# Patient Record
Sex: Female | Born: 1945 | Race: White | Hispanic: No | Marital: Married | Smoking: Former smoker
Health system: Southern US, Community
[De-identification: ages and names within clinical notes are randomized; demographics above are authoritative.]

## PROBLEM LIST (undated history)

## (undated) DIAGNOSIS — I219 Acute myocardial infarction, unspecified: Secondary | ICD-10-CM

## (undated) DIAGNOSIS — J449 Chronic obstructive pulmonary disease, unspecified: Secondary | ICD-10-CM

## (undated) DIAGNOSIS — K8020 Calculus of gallbladder without cholecystitis without obstruction: Secondary | ICD-10-CM

## (undated) DIAGNOSIS — G4730 Sleep apnea, unspecified: Secondary | ICD-10-CM

## (undated) DIAGNOSIS — I10 Essential (primary) hypertension: Secondary | ICD-10-CM

## (undated) DIAGNOSIS — I779 Disorder of arteries and arterioles, unspecified: Secondary | ICD-10-CM

## (undated) DIAGNOSIS — I350 Nonrheumatic aortic (valve) stenosis: Secondary | ICD-10-CM

## (undated) DIAGNOSIS — C50919 Malignant neoplasm of unspecified site of unspecified female breast: Secondary | ICD-10-CM

## (undated) DIAGNOSIS — F419 Anxiety disorder, unspecified: Secondary | ICD-10-CM

## (undated) DIAGNOSIS — I5032 Chronic diastolic (congestive) heart failure: Secondary | ICD-10-CM

## (undated) DIAGNOSIS — Z8669 Personal history of other diseases of the nervous system and sense organs: Secondary | ICD-10-CM

## (undated) DIAGNOSIS — N390 Urinary tract infection, site not specified: Secondary | ICD-10-CM

## (undated) DIAGNOSIS — E785 Hyperlipidemia, unspecified: Secondary | ICD-10-CM

## (undated) DIAGNOSIS — Z9289 Personal history of other medical treatment: Secondary | ICD-10-CM

## (undated) DIAGNOSIS — I739 Peripheral vascular disease, unspecified: Secondary | ICD-10-CM

## (undated) DIAGNOSIS — E119 Type 2 diabetes mellitus without complications: Secondary | ICD-10-CM

## (undated) DIAGNOSIS — E669 Obesity, unspecified: Secondary | ICD-10-CM

## (undated) DIAGNOSIS — R0602 Shortness of breath: Secondary | ICD-10-CM

## (undated) DIAGNOSIS — I2510 Atherosclerotic heart disease of native coronary artery without angina pectoris: Secondary | ICD-10-CM

## (undated) HISTORY — PX: CARDIAC CATHETERIZATION: SHX172

## (undated) HISTORY — DX: Personal history of other diseases of the nervous system and sense organs: Z86.69

## (undated) HISTORY — DX: Acute myocardial infarction, unspecified: I21.9

## (undated) HISTORY — PX: APPENDECTOMY: SHX54

## (undated) HISTORY — DX: Atherosclerotic heart disease of native coronary artery without angina pectoris: I25.10

## (undated) HISTORY — DX: Obesity, unspecified: E66.9

## (undated) HISTORY — PX: NASAL SINUS SURGERY: SHX719

## (undated) HISTORY — DX: Nonrheumatic aortic (valve) stenosis: I35.0

## (undated) HISTORY — DX: Malignant neoplasm of unspecified site of unspecified female breast: C50.919

## (undated) HISTORY — DX: Disorder of arteries and arterioles, unspecified: I77.9

## (undated) HISTORY — DX: Hyperlipidemia, unspecified: E78.5

## (undated) HISTORY — DX: Essential (primary) hypertension: I10

## (undated) HISTORY — DX: Personal history of other medical treatment: Z92.89

## (undated) HISTORY — DX: Chronic diastolic (congestive) heart failure: I50.32

## (undated) HISTORY — DX: Type 2 diabetes mellitus without complications: E11.9

## (undated) HISTORY — PX: US ECHOCARDIOGRAPHY: HXRAD669

## (undated) HISTORY — DX: Peripheral vascular disease, unspecified: I73.9

## (undated) HISTORY — PX: TONSILLECTOMY: SUR1361

## (undated) HISTORY — PX: EYE SURGERY: SHX253

---

## 1975-08-06 HISTORY — PX: ABDOMINAL HYSTERECTOMY: SHX81

## 2001-02-11 ENCOUNTER — Other Ambulatory Visit: Admission: RE | Admit: 2001-02-11 | Discharge: 2001-02-11 | Payer: Self-pay | Admitting: *Deleted

## 2007-09-04 LAB — PULMONARY FUNCTION TEST

## 2008-05-18 ENCOUNTER — Ambulatory Visit (HOSPITAL_COMMUNITY): Admission: RE | Admit: 2008-05-18 | Discharge: 2008-05-18 | Payer: Self-pay | Admitting: Surgery

## 2008-06-13 ENCOUNTER — Ambulatory Visit (HOSPITAL_BASED_OUTPATIENT_CLINIC_OR_DEPARTMENT_OTHER): Admission: RE | Admit: 2008-06-13 | Discharge: 2008-06-13 | Payer: Self-pay | Admitting: Surgery

## 2008-06-15 ENCOUNTER — Encounter: Admission: RE | Admit: 2008-06-15 | Discharge: 2008-09-13 | Payer: Self-pay | Admitting: Surgery

## 2008-06-18 ENCOUNTER — Ambulatory Visit: Payer: Self-pay | Admitting: Internal Medicine

## 2008-06-22 ENCOUNTER — Ambulatory Visit (HOSPITAL_COMMUNITY): Admission: RE | Admit: 2008-06-22 | Discharge: 2008-06-22 | Payer: Self-pay | Admitting: Surgery

## 2008-08-05 HISTORY — PX: LAPAROSCOPIC GASTRIC BANDING: SHX1100

## 2008-08-23 ENCOUNTER — Ambulatory Visit (HOSPITAL_COMMUNITY): Admission: RE | Admit: 2008-08-23 | Discharge: 2008-08-24 | Payer: Self-pay | Admitting: Surgery

## 2008-10-13 ENCOUNTER — Encounter: Admission: RE | Admit: 2008-10-13 | Discharge: 2008-10-13 | Payer: Self-pay | Admitting: Surgery

## 2008-11-14 ENCOUNTER — Encounter: Admission: RE | Admit: 2008-11-14 | Discharge: 2008-11-14 | Payer: Self-pay | Admitting: Surgery

## 2009-02-13 ENCOUNTER — Encounter: Admission: RE | Admit: 2009-02-13 | Discharge: 2009-02-13 | Payer: Self-pay | Admitting: Surgery

## 2009-11-23 ENCOUNTER — Encounter: Admission: RE | Admit: 2009-11-23 | Discharge: 2010-02-21 | Payer: Self-pay | Admitting: Surgery

## 2010-08-26 ENCOUNTER — Encounter: Payer: Self-pay | Admitting: Surgery

## 2010-11-19 LAB — DIFFERENTIAL
Basophils Absolute: 0 10*3/uL (ref 0.0–0.1)
Basophils Absolute: 0 10*3/uL (ref 0.0–0.1)
Eosinophils Absolute: 0 10*3/uL (ref 0.0–0.7)
Eosinophils Relative: 0 % (ref 0–5)
Lymphocytes Relative: 12 % (ref 12–46)
Lymphocytes Relative: 32 % (ref 12–46)
Lymphs Abs: 1.6 10*3/uL (ref 0.7–4.0)
Monocytes Absolute: 0.4 10*3/uL (ref 0.1–1.0)
Neutro Abs: 2.9 10*3/uL (ref 1.7–7.7)

## 2010-11-19 LAB — HEMOGLOBIN AND HEMATOCRIT, BLOOD: HCT: 43.1 % (ref 36.0–46.0)

## 2010-11-19 LAB — CBC
HCT: 39.7 % (ref 36.0–46.0)
MCHC: 32.7 g/dL (ref 30.0–36.0)
MCV: 89.2 fL (ref 78.0–100.0)
Platelets: 137 10*3/uL — ABNORMAL LOW (ref 150–400)
Platelets: 143 10*3/uL — ABNORMAL LOW (ref 150–400)
RDW: 12.9 % (ref 11.5–15.5)
RDW: 13 % (ref 11.5–15.5)

## 2010-11-19 LAB — COMPREHENSIVE METABOLIC PANEL
Albumin: 3.6 g/dL (ref 3.5–5.2)
BUN: 22 mg/dL (ref 6–23)
Calcium: 9.4 mg/dL (ref 8.4–10.5)
Chloride: 106 mEq/L (ref 96–112)
Creatinine, Ser: 0.86 mg/dL (ref 0.4–1.2)
Total Bilirubin: 0.5 mg/dL (ref 0.3–1.2)
Total Protein: 6.9 g/dL (ref 6.0–8.3)

## 2010-11-19 LAB — GLUCOSE, CAPILLARY: Glucose-Capillary: 173 mg/dL — ABNORMAL HIGH (ref 70–99)

## 2010-12-18 NOTE — Op Note (Signed)
[name], [name]                 ACCOUNT NO.:  [id number]   MEDICAL RECORD NO.:  [id number]          PATIENT TYPE:  AMB   LOCATION:  DAY                          FACILITY:  [facility]   PHYSICIAN:  Thornton Park. Daphine Deutscher, MD  DATE OF BIRTH:  25-Nov-1945   DATE OF PROCEDURE:  08/23/2008  DATE OF DISCHARGE:                               OPERATIVE REPORT   PREOPERATIVE DIAGNOSIS:  Morbid obesity, BMI of 45.   POSTOPERATIVE DIAGNOSIS:  Morbid obesity, BMI of 45.   PROCEDURE:  Laparoscopic adjustable gastric banding with Allergan APL  system.   SURGEON:  Luretha Murphy, M.D.   ASSISTANT:  Lorne Skeens. Hoxworth, M.D.   ANESTHESIA:  General endotracheal.   DESCRIPTION OF PROCEDURE:  This 65 year old lady was taken to room 1 on  August 23, 2008 and given general anesthesia.  The abdomen was prepped  with a Techni-Care equivalent and draped sterilely.  Access to the  abdomen was gained through the left upper quadrant using an OptiView 0  degrees 12 mm trocar entering without difficulty.  Once inflated the  abdomen was explored.  Usual trocar placements were used including a 15  in the upper port on the right.   I then did dissection at the EG junction on the left along the crura for  the band exit site and then went down along the right crus and found the  site for the band passer to be passed.  This was done easily and the  band passer went around and it came out through afore-described  dissected area.  This went well and the band was introduced through the  15 mm port and threaded through the tip.  The band then came around on  the first pass and was engaged in the buckle.  The tubing was passed by  Delphia Grates, the nurse anesthetist and blown up to 15 mL and pulled  back and no hiatal hernia was noted.  The band was then snapped over the  tubing and again the black collar was visible.  It was then plicated  anteriorly with three sutures using three SH needles held in place using  the  Surgidac with tie knots.  Once completed, everything looked good.  The Nathanson's retractor was withdrawn and the tubing was  brought to the lower port on the right.  It was connected to a port to  which I had previously affixed mesh along the back.  This was then  implanted in the subcutaneous pocket at the lateral margin of the  incision.  The patient tolerated this procedure well.  Wounds were  closed with 4-0 Vicryl, Benzoin Steri-Strips.      Thornton Park Daphine Deutscher, MD  Electronically Signed     MBM/MEDQ  D:  08/23/2008  T:  08/23/2008  Job:  [id number]   cc:   Duwayne Heck L. Mahaffey, M.D.  Fax: [phone]

## 2010-12-18 NOTE — Procedures (Signed)
NAME:  Jamie [name], Jamie [name]                 ACCOUNT NO.:  [id number]   MEDICAL RECORD NO.:  [id number]          PATIENT TYPE:  OUT   LOCATION:  SLEEP CENTER                 FACILITY:  [facility]   PHYSICIAN:  Clinton D. Maple Hudson, MD, FCCP, FACPDATE OF BIRTH:  1945/04/04   DATE OF STUDY:  06/13/2008                            NOCTURNAL POLYSOMNOGRAM   REFERRING PHYSICIAN:  Thornton Park. Daphine Deutscher, MD   REFERRING PHYSICIAN:  Thornton Park. Daphine Deutscher, MD   INDICATION FOR STUDY:  Hypersomnia with sleep apnea.  Epworth sleepiness  score 5/24.  BMI 46.9.  Weight 282 pounds.  Height 65 inches.  Neck 16  inches.   HOME MEDICATIONS:  Charted and reviewed.   SLEEP ARCHITECTURE:  Total sleep time is 343 minutes with sleep  efficiency 80%.  Stage I was 6.9%, stage II was 73.2%, stage III absent,  REM 20% of the total sleep time.  Sleep latency 34 minutes, REM latency  168 minutes, the wake-after-sleep onset 49 minutes, arousal index 22.6.   BEDTIME MEDICATIONS:  Included lisinopril, loratadine, metoprolol, baby  aspirin, and simvastatin.   RESPIRATORY DATA:  Apnea-hypopnea index (AHI) 11.5 per hour.  A total of  66 events was scored, all hypopneas.  Events were more common while  supine.  REM AHI 37.7 per hour.   OXYGEN DATA:  Moderate snoring with oxygen desaturation to a nadir of  67%.  Mean oxygen saturation through the study was 93.2% on room air.   CARDIAC DATA:  Sinus rhythm with occasional PAC.   MOVEMENT/PARASOMNIA:  Occasional period limb movement noted with a total  of 72 limb jerks of which 7 were associated with arousal or awakening  for periodic limb movement with arousal index of 1.2 per hour.  One  bathroom trip.   IMPRESSION/RECOMMENDATION:  1. Mild obstructive sleep apnea/hypopnea syndrome, apnea-hypopnea      index 11.5 per hour.  All events were hypopneas, seen in all sleep      positions, but more common while supine as expected.  Moderate      snoring with oxygen desaturation to a nadir of  67%.  A total of      18.4 minutes was spent with oxygen saturation less than 88% on room      air.  2. The patient did not qualify for CPAP titration by emergency      protocol per the scheduling orders.  CPAP would be a therapeutic      option if clinically appropriate, especially if surgery with      general anesthesia is anticipated.      She could be treated by auto-titration during hospitalization or      with an empiric CPAP setting of 10CWP until there is opportunity to      have a formal CPAP titration.      Clinton D. Maple Hudson, MD, [facility], FACP  Diplomate, Biomedical engineer of Sleep Medicine  Electronically Signed     CDY/MEDQ  D:  06/18/2008 13:18:05  T:  06/19/2008 03:07:17  Job:  [id number]

## 2011-08-06 DIAGNOSIS — C50919 Malignant neoplasm of unspecified site of unspecified female breast: Secondary | ICD-10-CM

## 2011-08-06 HISTORY — DX: Malignant neoplasm of unspecified site of unspecified female breast: C50.919

## 2011-08-26 ENCOUNTER — Other Ambulatory Visit: Payer: Self-pay | Admitting: Family Medicine

## 2011-08-26 ENCOUNTER — Ambulatory Visit
Admission: RE | Admit: 2011-08-26 | Discharge: 2011-08-26 | Disposition: A | Discharge status: Home / Self Care | Payer: Medicare Other | Source: Ambulatory Visit | Attending: Family Medicine | Admitting: Family Medicine

## 2011-08-26 ENCOUNTER — Ambulatory Visit (INDEPENDENT_AMBULATORY_CARE_PROVIDER_SITE_OTHER): Payer: Medicare Other | Admitting: General Surgery

## 2011-08-26 ENCOUNTER — Encounter (INDEPENDENT_AMBULATORY_CARE_PROVIDER_SITE_OTHER): Payer: Self-pay | Admitting: General Surgery

## 2011-08-26 VITALS — BP 126/74 | HR 70 | Temp 98.3°F | Resp 18 | Ht 66.0 in | Wt 274.8 lb

## 2011-08-26 DIAGNOSIS — N63 Unspecified lump in unspecified breast: Secondary | ICD-10-CM

## 2011-08-26 DIAGNOSIS — N6320 Unspecified lump in the left breast, unspecified quadrant: Secondary | ICD-10-CM | POA: Insufficient documentation

## 2011-08-26 NOTE — Progress Notes (Signed)
Subjective:     Patient ID: Jamie [name], female   DOB: [birth year], 66 y.o.   MRN: [id number]  HPI We are asked to see the patient in consultation by Dr. Joetta Manners to evaluate her for a left breast mass. The patient is a 66 year old white female who first noticed a lump in her left breast about 3 weeks ago. She felt this in the shower while she was washing. She had also been sick with an upper respiratory infection about the same time. Since noticing it she does feel some discomfort there. She denies any discharge from the nipple on either side. She does have an aunt that has breast cancer. Otherwise she has no history of breast problems. She brought this mass to her doctor's attention. She underwent mammograms and ultrasounds that showed the area to be about 3 cm in diameter. It does appear as though the mass was present on her mammogram from July of 2012  Review of Systems  Constitutional: Negative.   HENT: Negative.   Eyes: Negative.   Respiratory: Negative.   Cardiovascular: Negative.   Gastrointestinal: Negative.   Genitourinary: Negative.   Musculoskeletal: Negative.   Skin: Negative.   Neurological: Negative.   Hematological: Negative.   Psychiatric/Behavioral: Negative.        Objective:   Physical Exam  Constitutional: She is oriented to person, place, and time. She appears well-developed and well-nourished.  HENT:  Head: Normocephalic and atraumatic.  Eyes: Conjunctivae and EOM are normal. Pupils are equal, round, and reactive to light.  Neck: Normal range of motion. Neck supple.  Cardiovascular: Normal rate, regular rhythm and normal heart sounds.   Pulmonary/Chest: Effort normal and breath sounds normal.       The patient has a approximately 3 cm mass in the 12:00 position of the left breast. It is mobile. It does not appear to be tethered to the skin of the chest wall. There is no palpable mass in the right breast other than just some small nodularity diffusely. No  palpable axillary supraclavicular or cervical lymphadenopathy  Abdominal: Soft. Bowel sounds are normal. She exhibits no mass. There is no tenderness.  Musculoskeletal: Normal range of motion.  Neurological: She is alert and oriented to person, place, and time.  Skin: Skin is warm and dry.  Psychiatric: She has a normal mood and affect. Her behavior is normal.       Assessment:     Large palpable mass in the 12:00 position of the left breast    Plan:     The mass looked suspicious for breast cancer. I have looked at the x-rays with the radiologist at the breast center and they concur. We will have her go to the breast center today for further evaluation. She will definitely need a core needle biopsy of this lesion before we can determine what needs to be done. We will plan to see her back in next few days to go over the results of her biopsy.

## 2011-08-27 ENCOUNTER — Other Ambulatory Visit: Payer: Self-pay

## 2011-08-27 ENCOUNTER — Other Ambulatory Visit (INDEPENDENT_AMBULATORY_CARE_PROVIDER_SITE_OTHER): Payer: Self-pay | Admitting: General Surgery

## 2011-08-27 DIAGNOSIS — N6489 Other specified disorders of breast: Secondary | ICD-10-CM

## 2011-08-27 DIAGNOSIS — C50912 Malignant neoplasm of unspecified site of left female breast: Secondary | ICD-10-CM

## 2011-08-28 ENCOUNTER — Ambulatory Visit
Admission: RE | Admit: 2011-08-28 | Discharge: 2011-08-28 | Disposition: A | Discharge status: Home / Self Care | Payer: Medicare Other | Source: Ambulatory Visit | Attending: General Surgery | Admitting: General Surgery

## 2011-08-28 ENCOUNTER — Other Ambulatory Visit (INDEPENDENT_AMBULATORY_CARE_PROVIDER_SITE_OTHER): Payer: Self-pay | Admitting: General Surgery

## 2011-08-28 ENCOUNTER — Encounter (INDEPENDENT_AMBULATORY_CARE_PROVIDER_SITE_OTHER): Payer: Self-pay | Admitting: General Surgery

## 2011-08-28 ENCOUNTER — Telehealth: Payer: Self-pay | Admitting: *Deleted

## 2011-08-28 ENCOUNTER — Ambulatory Visit (INDEPENDENT_AMBULATORY_CARE_PROVIDER_SITE_OTHER): Payer: Medicare Other | Admitting: General Surgery

## 2011-08-28 VITALS — BP 146/80 | HR 72 | Temp 98.2°F | Resp 14 | Ht 65.0 in | Wt 274.0 lb

## 2011-08-28 DIAGNOSIS — C50919 Malignant neoplasm of unspecified site of unspecified female breast: Secondary | ICD-10-CM

## 2011-08-28 DIAGNOSIS — N6489 Other specified disorders of breast: Secondary | ICD-10-CM

## 2011-08-28 NOTE — Progress Notes (Signed)
Subjective:     Patient ID: Jamie [name], female   DOB: 1945/08/27, 66 y.o.   MRN: [id number]  HPI The patient is a 66 year old white female who we saw a couple days ago with a new mass in the left breast. This was biopsied I believe on Monday and it came back as invasive ductal cancer. Tumor markers are still pending. The patient tolerated the biopsy well.  Review of Systems     Objective:   Physical Exam Her exam is unchanged. 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 I've discussed this with her in detail including the risks and benefits of surgery as well as some of the clinical aspects and she understands. We would like her to meet with Dr. Darnelle Catalan to talk about the possibility of chemotherapy either before or after surgery prior to planning her mastectomies. We will call and make the appointment with Dr. Darnelle Catalan.

## 2011-08-28 NOTE — Patient Instructions (Signed)
Will call to set up surgery after appt with Dr. Darnelle Catalan

## 2011-08-28 NOTE — Telephone Encounter (Signed)
Confirmed 09/03/11 appt w/ pt's husband Forrest.  Mailed before appt letter to pt.

## 2011-08-29 ENCOUNTER — Other Ambulatory Visit (INDEPENDENT_AMBULATORY_CARE_PROVIDER_SITE_OTHER): Payer: Self-pay | Admitting: General Surgery

## 2011-08-29 DIAGNOSIS — C50912 Malignant neoplasm of unspecified site of left female breast: Secondary | ICD-10-CM

## 2011-08-30 ENCOUNTER — Ambulatory Visit
Admission: RE | Admit: 2011-08-30 | Discharge: 2011-08-30 | Disposition: A | Discharge status: Home / Self Care | Payer: Medicare Other | Source: Ambulatory Visit | Attending: General Surgery | Admitting: General Surgery

## 2011-08-30 ENCOUNTER — Other Ambulatory Visit (INDEPENDENT_AMBULATORY_CARE_PROVIDER_SITE_OTHER): Payer: Self-pay | Admitting: General Surgery

## 2011-08-30 ENCOUNTER — Other Ambulatory Visit: Payer: Medicare Other

## 2011-08-30 ENCOUNTER — Other Ambulatory Visit: Payer: Self-pay | Admitting: Diagnostic Radiology

## 2011-08-30 DIAGNOSIS — C50912 Malignant neoplasm of unspecified site of left female breast: Secondary | ICD-10-CM

## 2011-09-02 ENCOUNTER — Other Ambulatory Visit: Payer: Self-pay | Admitting: *Deleted

## 2011-09-02 DIAGNOSIS — C50919 Malignant neoplasm of unspecified site of unspecified female breast: Secondary | ICD-10-CM

## 2011-09-03 ENCOUNTER — Ambulatory Visit (HOSPITAL_BASED_OUTPATIENT_CLINIC_OR_DEPARTMENT_OTHER): Payer: Medicare Other | Admitting: Oncology

## 2011-09-03 ENCOUNTER — Ambulatory Visit: Payer: Medicare Other

## 2011-09-03 ENCOUNTER — Encounter (INDEPENDENT_AMBULATORY_CARE_PROVIDER_SITE_OTHER): Payer: Medicare Other | Admitting: General Surgery

## 2011-09-03 ENCOUNTER — Other Ambulatory Visit (HOSPITAL_BASED_OUTPATIENT_CLINIC_OR_DEPARTMENT_OTHER): Payer: Medicare Other | Admitting: Lab

## 2011-09-03 VITALS — BP 165/79 | HR 87 | Temp 98.1°F | Ht 64.0 in | Wt 277.2 lb

## 2011-09-03 DIAGNOSIS — C50919 Malignant neoplasm of unspecified site of unspecified female breast: Secondary | ICD-10-CM

## 2011-09-03 LAB — CBC WITH DIFFERENTIAL/PLATELET
BASO%: 0.5 % (ref 0.0–2.0)
Eosinophils Absolute: 0.2 10*3/uL (ref 0.0–0.5)
LYMPH%: 32.3 % (ref 14.0–49.7)
MCHC: 33.3 g/dL (ref 31.5–36.0)
MCV: 86.4 fL (ref 79.5–101.0)
MONO%: 9.9 % (ref 0.0–14.0)
NEUT#: 3 10*3/uL (ref 1.5–6.5)
Platelets: 154 10*3/uL (ref 145–400)
RBC: 4.67 10*6/uL (ref 3.70–5.45)
RDW: 13 % (ref 11.2–14.5)
WBC: 5.6 10*3/uL (ref 3.9–10.3)

## 2011-09-03 LAB — COMPREHENSIVE METABOLIC PANEL
ALT: 39 U/L — ABNORMAL HIGH (ref 0–35)
AST: 32 U/L (ref 0–37)
Albumin: 3.4 g/dL — ABNORMAL LOW (ref 3.5–5.2)
Alkaline Phosphatase: 69 U/L (ref 39–117)
Glucose, Bld: 92 mg/dL (ref 70–99)
Potassium: 3.8 mEq/L (ref 3.5–5.3)
Sodium: 141 mEq/L (ref 135–145)
Total Bilirubin: 0.2 mg/dL — ABNORMAL LOW (ref 0.3–1.2)
Total Protein: 7.5 g/dL (ref 6.0–8.3)

## 2011-09-03 NOTE — Progress Notes (Signed)
Jamie [name]  DOB: 09/12/1945  MR#: [id number]  CSN#: [id number]    History of present illness:   Jamie [name] is a 66 year old [name] woman who noted a mass in her left breast some weeks ago. She saw her primary physician, Dr. Carolyne Fiscal shortly thereafter and Dr. Carolyne Fiscal set her up for a diagnostic mammography, showing a mass of about 3 cm in diameter(I do not have a copy of those studies with a prior mammography from July of 2012). The patient was referred to Dr. Carolynne Edouard and he set her up for biopsy of the mass degenerative 21st 2013 at the breast Center. The pathology showed [phone]) and invasive ductal carcinoma, grade 2, estrogen receptor 100% positive, progesterone receptor 96% positive, with an MIB-1 of 30%, and HER-2 equivocal with the ratio by CISH of 1.81.  Diagnostic right mammography was then performed 08/28/2011. Multiple masses were noted, and there were multiple soft tissue nodules by palpation. Ultrasound specifically showed 2 hypoechoic round masses measuring 1.3 and 0.9 cm respectively. 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 With this information the patient presents today for discussion of her overall treatment plan.   Past medical history:      Past Medical History  Diagnosis Date  . Left breast mass    COPD, obesity, 80-pack-year tobacco abuse, discontinued 3 years ago; history of bladder implant; history of C-section, appendectomy, and tonsillectomy and adenoidectomy. She had a hysterectomy with no stopping the oophorectomy. She status post lap band surgery under Dr. Daphine Deutscher  Family history:   The patient's father died at the age of 69. He had a history of asbestos exposure. The patient's mother died at the age of 46 from heart disease. The patient had one brother, no sisters. There is no breast or ovarian cancer in the immediate family but 2 of the patient's 5 maternal aunts had breast cancer (she does not know  the age of diagnosis), and one of her father's sisters had ovarian cancer.  Gynecologic history:   She had menarche age 45, no firm date for her menopause as she had hysterectomy remotely. She is GX P2, with a pair of twins and a daughter as her biological children as noted below. She never took hormone replacement.  Social history:   She owns a tuxedo shop. Her husband of 34 years, [name], is retired of but used to own a Designer, television/film set. He is currently working in IT with one of his own sons. At home in addition to the patient and her husband is one of the patient's daughters and her 2 children, as well as one of Forrest sons. Altogether they have 6 children and 15 grandchildren. The patient attends [employer].    ADVANCED DIRECTIVES:  Health maintenance:       History  Substance Use Topics  . Smoking status: Former Smoker    Quit date: 08/25/2006  . Smokeless tobacco: Never Used  . Alcohol Use: Yes     rarely      Colonoscopy: 2012  PAP: n/a  Bone density: "normal"  Cholesterol:  Review of systems:  She has currently a little bit of a sore throat and some mouth sores. Her feet swell at times. She feels short of breath when she does have a significant walking or when she walks up stairs. She bruises easily. Overall however a detailed review of systems today was unremarkable  Allergies:     Allergies  Allergen  Reactions  . Sulfur Other (See Comments)    Patient unaware of reaction, happened in childhood.    Medications:      Current Outpatient Prescriptions  Medication Sig Dispense Refill  . calcium citrate-vitamin D (CITRACAL+D) 315-200 MG-UNIT per tablet Take 1 tablet by mouth 2 (two) times daily.      . fish oil-omega-3 fatty acids 1000 MG capsule Take 2 g by mouth daily.      Marland Kitchen loratadine (CLARITIN) 10 MG tablet Take 10 mg by mouth daily.      . simvastatin (ZOCOR) 20 MG tablet Daily.      Marland Kitchen tiotropium (SPIRIVA) 18 MCG inhalation capsule Place 18 mcg into inhaler  and inhale daily.      Marland Kitchen aspirin 81 MG tablet Take 160 mg by mouth daily.      Marland Kitchen nystatin (MYCOSTATIN) 100000 UNIT/ML suspension as needed.        Physical exam:  Middle-aged white woman who appears anxious    Filed Vitals:   09/03/11 1623  BP: 165/79  Pulse: 87  Temp: 98.1 F (36.7 C)     Body mass index is 47.58 kg/(m^2).  ECOG PS: 0  Sclerae unicteric Oropharynx clear No peripheral adenopathy Lungs no rales or rhonchi Heart regular rate and rhythm Abd benign MSK no focal spinal tenderness, no peripheral edema Neuro: nonfocal Breasts: The right breast is status post recent biopsy. As a large ecchymosis. I do not palpate any definite masses. In the left breast and there is a lateral superior mass measuring at least 3 cm, but there is also an associated ecchymosis and some of this may be swelling. There is no nipple retraction or evidence of skin involvement. The left breast mass is not fixed.   Lab results:      CBC  Lab 09/03/11 1549  WBC 5.6  HGB 13.4  HCT 40.4  PLT 154  MCV 86.4  MCH 28.8  MCHC 33.3  RDW 13.0  LYMPHSABS 1.8  MONOABS 0.6  EOSABS 0.2  BASOSABS 0.0  BANDABS --    Chemistries   Lab 09/03/11 1549  NA 141  K 3.8  CL 105  CO2 25  GLUCOSE 92  BUN 15  CREATININE 0.82  CALCIUM 9.8  MG --      Studies:    As a ready discussed.  Assessment: 66 year old [name] woman status post left breast biopsy 08/26/2011 for a clinical T2 N0, stage II A. invasive ductal carcinoma, grade 2, strongly estrogen and progesterone receptor positive, HER-2 a quick local, with an MIB-1 of 30%. In addition, one of 2 masses in the right breast was biopsied showing a sclerosing papilloma.       Plan: We spent the better part of her hour-long visit today discussing the biology of her tumor, treatment of breast cancer in general, and her on the situation. The decision whether or not to recommend chemotherapy is not simple. The adjuvant! Program would put her a risk of  recurrence in the 34% range with local treatment only if indeed she has a T2 N0 tumor. Anti-estrogens would reduce this risk by 15% or so, and chemotherapy might reduce in another 4-5%. All this would change if with additional information we found that she was lymph node positive, or that she ended up being a truly HER-2 positive.   For that reason I am not recommending port be placed at the time of her definitive surgery. Also we will not set another type of until we have  the sentinel lymph node information. If she is lymph node-negative however I think an Oncotype would be a good idea in this case.  She is aware that mastectomy does not improve survival as compared to lumpectomy and radiation, but is very drawn to the idea of bilateral mastectomies because she "wants to be done with it" in terms of no future mammograms, no additional biopsies now or in the future, and so on. I believe she is making this decision on a rational basis and I support it in her case, even though she understands our standard recommendation and that of the NCCN is for lumpectomy and radiation.  She is interested in genetics counseling, and though her risk of being BRCA1 or 2 positive is low, I will place that referral. I am also setting her up for a CT scan of the chest. I expect that to be negative. She will see me 2-4 weeks after her definitive surgery. At that time we should be able to make a definitive decision regarding her systemic treatment plan   Tinzlee Craker C 09/03/2011

## 2011-09-04 ENCOUNTER — Telehealth: Payer: Self-pay | Admitting: Oncology

## 2011-09-04 ENCOUNTER — Encounter: Payer: Self-pay | Admitting: *Deleted

## 2011-09-04 NOTE — Telephone Encounter (Signed)
S/w the pt and she is aware of her feb 2013 ct scan appt along with the md appt in march

## 2011-09-04 NOTE — Progress Notes (Signed)
Mailed after appt letter to pt. 

## 2011-09-05 ENCOUNTER — Telehealth (INDEPENDENT_AMBULATORY_CARE_PROVIDER_SITE_OTHER): Payer: Self-pay | Admitting: General Surgery

## 2011-09-10 ENCOUNTER — Other Ambulatory Visit (INDEPENDENT_AMBULATORY_CARE_PROVIDER_SITE_OTHER): Payer: Self-pay | Admitting: General Surgery

## 2011-09-10 ENCOUNTER — Ambulatory Visit (HOSPITAL_COMMUNITY)
Admission: RE | Admit: 2011-09-10 | Discharge: 2011-09-10 | Disposition: A | Discharge status: Home / Self Care | Payer: Medicare Other | Source: Ambulatory Visit | Attending: Oncology | Admitting: Oncology

## 2011-09-10 ENCOUNTER — Encounter (HOSPITAL_COMMUNITY): Payer: Self-pay

## 2011-09-10 DIAGNOSIS — R05 Cough: Secondary | ICD-10-CM | POA: Insufficient documentation

## 2011-09-10 DIAGNOSIS — Z9884 Bariatric surgery status: Secondary | ICD-10-CM | POA: Insufficient documentation

## 2011-09-10 DIAGNOSIS — M47814 Spondylosis without myelopathy or radiculopathy, thoracic region: Secondary | ICD-10-CM | POA: Insufficient documentation

## 2011-09-10 DIAGNOSIS — J9819 Other pulmonary collapse: Secondary | ICD-10-CM | POA: Insufficient documentation

## 2011-09-10 DIAGNOSIS — R0602 Shortness of breath: Secondary | ICD-10-CM | POA: Insufficient documentation

## 2011-09-10 DIAGNOSIS — C50919 Malignant neoplasm of unspecified site of unspecified female breast: Secondary | ICD-10-CM

## 2011-09-10 DIAGNOSIS — K7689 Other specified diseases of liver: Secondary | ICD-10-CM | POA: Insufficient documentation

## 2011-09-10 DIAGNOSIS — R059 Cough, unspecified: Secondary | ICD-10-CM | POA: Insufficient documentation

## 2011-09-10 HISTORY — DX: Chronic obstructive pulmonary disease, unspecified: J44.9

## 2011-09-10 MED ORDER — IOHEXOL 300 MG/ML  SOLN
100.0000 mL | Freq: Once | INTRAMUSCULAR | Status: AC | PRN
  Administered 2011-09-10: 100 mL via INTRAVENOUS

## 2011-09-11 ENCOUNTER — Telehealth (INDEPENDENT_AMBULATORY_CARE_PROVIDER_SITE_OTHER): Payer: Self-pay | Admitting: General Surgery

## 2011-09-11 ENCOUNTER — Telehealth: Payer: Self-pay | Admitting: *Deleted

## 2011-09-11 NOTE — Telephone Encounter (Signed)
Confirmed 09/23/11 genetics appt w pt.  Gave request to Med Rec to fax labs to pts primary Heather Spry at [place].

## 2011-09-11 NOTE — Telephone Encounter (Signed)
I CALLED Jamie [name] YESTERDAY RE SCHEDULING OF HER BILATERAL MASTECTOMIES/ SHE INDICATED SHE WOULD LIKE TO SPEAK WITH A PLASTIC SURGEON RE RECONSTRUCTION. I CALLED DR. SANGER'S OFFICE TODAY RE AN APPT/ DARA SCHEDULED Jamie [name] TO SEE DR. C. SANGER ON 09-24-11 AT 9:45AM/ PT NOTIFIED OF APPOINTMENT/GY

## 2011-09-23 ENCOUNTER — Ambulatory Visit: Payer: Medicare Other | Admitting: Lab

## 2011-09-23 ENCOUNTER — Ambulatory Visit: Payer: Medicare Other

## 2011-09-23 NOTE — Progress Notes (Signed)
Blood sent to [facility] for BRCA1/BRCA2.

## 2011-10-01 ENCOUNTER — Telehealth: Payer: Self-pay | Admitting: Genetic Counselor

## 2011-10-03 ENCOUNTER — Encounter (INDEPENDENT_AMBULATORY_CARE_PROVIDER_SITE_OTHER): Payer: Self-pay

## 2011-10-04 ENCOUNTER — Encounter (HOSPITAL_COMMUNITY): Payer: Self-pay | Admitting: Pharmacy Technician

## 2011-10-10 ENCOUNTER — Telehealth: Payer: Self-pay | Admitting: *Deleted

## 2011-10-10 NOTE — Telephone Encounter (Signed)
Patient called stating that her surgery is on 3/15 and her f/u appt is w/ Dr. Thea Silversmith on 3/14 and needs to change it.  I read over Dr. Darrall Dears note and it stated that he wanted to see her 3-4 weeks after surgery, so I rescheduled her appt.  Confirmed 3/27 appt w/ pt.

## 2011-10-14 ENCOUNTER — Other Ambulatory Visit (HOSPITAL_COMMUNITY): Payer: Medicare Other

## 2011-10-14 ENCOUNTER — Ambulatory Visit (HOSPITAL_COMMUNITY)
Admission: RE | Admit: 2011-10-14 | Discharge: 2011-10-14 | Disposition: A | Discharge status: Home / Self Care | Payer: Medicare Other | Source: Ambulatory Visit | Attending: Anesthesiology | Admitting: Anesthesiology

## 2011-10-14 ENCOUNTER — Encounter (HOSPITAL_COMMUNITY): Admission: RE | Payer: Self-pay | Source: Ambulatory Visit

## 2011-10-14 ENCOUNTER — Ambulatory Visit (HOSPITAL_COMMUNITY): Admission: RE | Admit: 2011-10-14 | Payer: Medicare Other | Source: Ambulatory Visit | Admitting: General Surgery

## 2011-10-14 ENCOUNTER — Encounter (HOSPITAL_COMMUNITY)
Admission: RE | Admit: 2011-10-14 | Discharge: 2011-10-14 | Disposition: A | Discharge status: Home / Self Care | Payer: Medicare Other | Source: Ambulatory Visit | Attending: General Surgery | Admitting: General Surgery

## 2011-10-14 ENCOUNTER — Other Ambulatory Visit: Payer: Self-pay

## 2011-10-14 ENCOUNTER — Encounter (HOSPITAL_COMMUNITY): Payer: Self-pay

## 2011-10-14 DIAGNOSIS — Z01810 Encounter for preprocedural cardiovascular examination: Secondary | ICD-10-CM | POA: Insufficient documentation

## 2011-10-14 DIAGNOSIS — Z01818 Encounter for other preprocedural examination: Secondary | ICD-10-CM | POA: Insufficient documentation

## 2011-10-14 DIAGNOSIS — Z01812 Encounter for preprocedural laboratory examination: Secondary | ICD-10-CM | POA: Insufficient documentation

## 2011-10-14 DIAGNOSIS — M47814 Spondylosis without myelopathy or radiculopathy, thoracic region: Secondary | ICD-10-CM | POA: Insufficient documentation

## 2011-10-14 HISTORY — DX: Shortness of breath: R06.02

## 2011-10-14 HISTORY — DX: Urinary tract infection, site not specified: N39.0

## 2011-10-14 HISTORY — DX: Sleep apnea, unspecified: G47.30

## 2011-10-14 LAB — BASIC METABOLIC PANEL
Calcium: 9.7 mg/dL (ref 8.4–10.5)
GFR calc Af Amer: 86 mL/min — ABNORMAL LOW (ref >90)
GFR calc non Af Amer: 75 mL/min — ABNORMAL LOW (ref >90)
Glucose, Bld: 91 mg/dL (ref 70–99)
Sodium: 141 mEq/L (ref 135–145)

## 2011-10-14 LAB — SURGICAL PCR SCREEN: Staphylococcus aureus: NEGATIVE

## 2011-10-14 LAB — CBC
HCT: 43.6 % (ref 36.0–46.0)
MCHC: 32.3 g/dL (ref 30.0–36.0)
Platelets: 185 10*3/uL (ref 150–400)
RDW: 13.5 % (ref 11.5–15.5)

## 2011-10-14 SURGERY — MASTECTOMY WITH SENTINEL LYMPH NODE BIOPSY
Anesthesia: General | Laterality: Bilateral

## 2011-10-14 MED ORDER — CHLORHEXIDINE GLUCONATE 4 % EX LIQD: 1.0000 "application " | Freq: Once | CUTANEOUS | Status: DC

## 2011-10-14 NOTE — Pre-Procedure Instructions (Addendum)
20 NENA HAMPE  10/14/2011   Your procedure is scheduled on:  3.15.13  Report to [facility] at 830 AM.  Call this number if you have problems the morning of surgery: [phone]   Remember:   Do not eat food:After Midnight.  May have clear liquids: up to 4 Hours before arrival.430 am  Clear liquids include soda, tea, black coffee, apple or grape juice, broth.  Take these medicines the morning of surgery with A SIP OF WATER:use inhalers if needed, bring with you, loradadine,spiriva STOP  Aspirin, fish oil , any herbal meds, blood thinners   Do not wear jewelry, make-up or nail polish.  Do not wear lotions, powders, or perfumes. You may wear deodorant.  Do not shave 48 hours prior to surgery.  Do not bring valuables to the hospital.  Contacts, dentures or bridgework may not be worn into surgery.  Leave suitcase in the car. After surgery it may be brought to your room.  For patients admitted to the hospital, checkout time is 11:00 AM the day of discharge.   Patients discharged the day of surgery will not be allowed to drive home.  Name and phone number of your driver:  Special Instructions: CHG Shower Use Special Wash: 1/2 bottle night before surgery and 1/2 bottle morning of surgery.   Please read over the following fact sheets that you were given: Pain Booklet, Coughing and Deep Breathing, MRSA Information and Surgical Site Infection Prevention

## 2011-10-14 NOTE — Progress Notes (Addendum)
Spoke with dr Kelly Splinter about orders. Will put in system. req sleep study from [facility] and echo from [facility] [place].

## 2011-10-15 ENCOUNTER — Telehealth: Payer: Self-pay | Admitting: *Deleted

## 2011-10-15 NOTE — Consult Note (Signed)
Anesthesia:  Patient is a 66 year old female scheduled for bilateral mastectomies with SN biopsy on 10/18/11 for diagnosis of left breast CA.  Other history includes COPD, former smoker, OSA, headaches, morbid obesity (BMI 46.79) with history of gastric banding.  PCP is Editor, commissioning at [facility].  Echo done at [facility] on 09/03/11 showed normal LV systolic function, EF 65%, mild LAE, mild LVH, no significant valvular stenosis or regurgitation.  EKG from 10/15/11 showed NSR, inferior infarct (age undetermined).  It was not felt significantly changed from her pre-operative EKG on 05/18/08 prior to her lap band procedure.  CXR showed no acute process.  CT scan of the chest ordered by Dr. Darnelle Catalan and done on 10/08/11 showed: 1. 3.2 cm mass in the left breast. No pathologically enlarged  axillary adenopathy is observed.  2. A right lower paratracheal lymph node has a short axis diameter  1.0 cm, borderline prominent.  3. Diffuse hepatic steatosis.  4. Band-like opacity medially in the left upper lobe favors  atelectasis or scarring, although does have a small nodular  component which should be monitored in 3-6 months.  5. Subsegmental atelectasis in the left lower lobe.  6. Lap band noted.   Labs noted.  Anticipate if no significant change in her status that she can proceed.

## 2011-10-15 NOTE — Telephone Encounter (Signed)
Called pt per MD review of CT of chest.

## 2011-10-16 NOTE — Progress Notes (Signed)
Spoke with Jeanella Anton at Dr Earlie Server office and requested surgical orders from Dr Kelly Splinter again.  She stated she will put in a message for Dr Kelly Splinter and then Dr Kelly Splinter will get them in [facility]

## 2011-10-17 ENCOUNTER — Ambulatory Visit: Payer: Medicare Other | Admitting: Oncology

## 2011-10-17 MED ORDER — CEFAZOLIN SODIUM-DEXTROSE 2-3 GM-% IV SOLR
2.0000 g | INTRAVENOUS | Status: AC
  Administered 2011-10-18: 2 g via INTRAVENOUS
  Filled 2011-10-17: qty 50

## 2011-10-17 NOTE — Progress Notes (Signed)
Call to Vincie at Dr. Leonie Green office, she states Dr. Kelly Splinter told her orders have been put in [facility].  Call to Dr. Leonie Green cell phone  to let her know that orders are not appearing in [facility].   Spoke with Alona Bene, She states Dr. Kelly Splinter  will review it in the next hr.

## 2011-10-18 ENCOUNTER — Encounter (HOSPITAL_COMMUNITY): Payer: Self-pay | Admitting: *Deleted

## 2011-10-18 ENCOUNTER — Other Ambulatory Visit: Payer: Self-pay | Admitting: Plastic Surgery

## 2011-10-18 ENCOUNTER — Ambulatory Visit (HOSPITAL_COMMUNITY)
Admission: RE | Admit: 2011-10-18 | Discharge: 2011-10-20 | Disposition: A | Discharge status: Home / Self Care | Payer: Medicare Other | Source: Ambulatory Visit | Attending: General Surgery | Admitting: General Surgery

## 2011-10-18 ENCOUNTER — Encounter (HOSPITAL_COMMUNITY): Payer: Self-pay

## 2011-10-18 ENCOUNTER — Encounter (HOSPITAL_COMMUNITY)
Admission: RE | Disposition: A | Discharge status: Home / Self Care | Payer: Self-pay | Source: Ambulatory Visit | Attending: General Surgery

## 2011-10-18 ENCOUNTER — Ambulatory Visit (HOSPITAL_COMMUNITY): Payer: Medicare Other | Admitting: Vascular Surgery

## 2011-10-18 ENCOUNTER — Encounter (HOSPITAL_COMMUNITY): Payer: Self-pay | Admitting: Vascular Surgery

## 2011-10-18 ENCOUNTER — Ambulatory Visit (HOSPITAL_COMMUNITY)
Admission: RE | Admit: 2011-10-18 | Discharge: 2011-10-18 | Disposition: A | Discharge status: Home / Self Care | Payer: Medicare Other | Source: Ambulatory Visit | Attending: General Surgery | Admitting: General Surgery

## 2011-10-18 DIAGNOSIS — D0590 Unspecified type of carcinoma in situ of unspecified breast: Secondary | ICD-10-CM | POA: Insufficient documentation

## 2011-10-18 DIAGNOSIS — G4730 Sleep apnea, unspecified: Secondary | ICD-10-CM | POA: Insufficient documentation

## 2011-10-18 DIAGNOSIS — C50919 Malignant neoplasm of unspecified site of unspecified female breast: Secondary | ICD-10-CM

## 2011-10-18 DIAGNOSIS — J449 Chronic obstructive pulmonary disease, unspecified: Secondary | ICD-10-CM | POA: Insufficient documentation

## 2011-10-18 DIAGNOSIS — R51 Headache: Secondary | ICD-10-CM | POA: Insufficient documentation

## 2011-10-18 DIAGNOSIS — J4489 Other specified chronic obstructive pulmonary disease: Secondary | ICD-10-CM | POA: Insufficient documentation

## 2011-10-18 DIAGNOSIS — N6019 Diffuse cystic mastopathy of unspecified breast: Secondary | ICD-10-CM

## 2011-10-18 DIAGNOSIS — Z4001 Encounter for prophylactic removal of breast: Secondary | ICD-10-CM

## 2011-10-18 DIAGNOSIS — C771 Secondary and unspecified malignant neoplasm of intrathoracic lymph nodes: Secondary | ICD-10-CM | POA: Insufficient documentation

## 2011-10-18 HISTORY — PX: AXILLARY LYMPH NODE DISSECTION: SHX5229

## 2011-10-18 HISTORY — PX: MASTECTOMY W/ SENTINEL NODE BIOPSY: SHX2001

## 2011-10-18 HISTORY — PX: BREAST RECONSTRUCTION: SHX9

## 2011-10-18 SURGERY — MASTECTOMY WITH SENTINEL LYMPH NODE BIOPSY
Anesthesia: General | Site: Breast | Laterality: Left | Wound class: Clean

## 2011-10-18 MED ORDER — DOCUSATE SODIUM 100 MG PO CAPS
100.0000 mg | ORAL_CAPSULE | Freq: Two times a day (BID) | ORAL | Status: DC
  Administered 2011-10-18 – 2011-10-20 (×4): 100 mg via ORAL
  Filled 2011-10-18 (×4): qty 1

## 2011-10-18 MED ORDER — LIDOCAINE HCL (CARDIAC) 20 MG/ML IV SOLN
INTRAVENOUS | Status: DC | PRN
  Administered 2011-10-18: 50 mg via INTRAVENOUS

## 2011-10-18 MED ORDER — MORPHINE SULFATE 2 MG/ML IJ SOLN: 2.0000 mg | INTRAMUSCULAR | Status: DC | PRN

## 2011-10-18 MED ORDER — 0.9 % SODIUM CHLORIDE (POUR BTL) OPTIME
TOPICAL | Status: DC | PRN
  Administered 2011-10-18: 4000 mL

## 2011-10-18 MED ORDER — HYDROMORPHONE HCL PF 1 MG/ML IJ SOLN
INTRAMUSCULAR | Status: AC
  Filled 2011-10-18: qty 1

## 2011-10-18 MED ORDER — FUROSEMIDE 10 MG/ML IJ SOLN
20.0000 mg | Freq: Once | INTRAMUSCULAR | Status: AC
  Administered 2011-10-18: 20 mg via INTRAVENOUS

## 2011-10-18 MED ORDER — CALCIUM CITRATE-VITAMIN D 315-200 MG-UNIT PO TABS: 2.0000 | ORAL_TABLET | Freq: Every day | ORAL | Status: DC

## 2011-10-18 MED ORDER — ONDANSETRON HCL 4 MG/2ML IJ SOLN: 4.0000 mg | Freq: Once | INTRAMUSCULAR | Status: DC | PRN

## 2011-10-18 MED ORDER — ACETAMINOPHEN 325 MG PO TABS: 650.0000 mg | ORAL_TABLET | Freq: Four times a day (QID) | ORAL | Status: DC | PRN

## 2011-10-18 MED ORDER — HYDROCODONE-ACETAMINOPHEN 5-325 MG PO TABS
1.0000 | ORAL_TABLET | ORAL | Status: DC | PRN
  Administered 2011-10-18 – 2011-10-19 (×2): 2 via ORAL
  Filled 2011-10-18 (×2): qty 2

## 2011-10-18 MED ORDER — HYDROMORPHONE HCL PF 1 MG/ML IJ SOLN
0.2500 mg | INTRAMUSCULAR | Status: DC | PRN
  Administered 2011-10-18: 0.5 mg via INTRAVENOUS

## 2011-10-18 MED ORDER — DROPERIDOL 2.5 MG/ML IJ SOLN
INTRAMUSCULAR | Status: DC | PRN
  Administered 2011-10-18: 0.625 mg via INTRAVENOUS

## 2011-10-18 MED ORDER — KCL IN DEXTROSE-NACL 10-5-0.45 MEQ/L-%-% IV SOLN: INTRAVENOUS | Status: DC

## 2011-10-18 MED ORDER — ONDANSETRON HCL 4 MG/2ML IJ SOLN: 4.0000 mg | Freq: Four times a day (QID) | INTRAMUSCULAR | Status: DC | PRN

## 2011-10-18 MED ORDER — SODIUM CHLORIDE 0.9 % IR SOLN
Status: DC | PRN
  Administered 2011-10-18: 13:00:00

## 2011-10-18 MED ORDER — SODIUM CHLORIDE 0.9 % IJ SOLN
INTRAMUSCULAR | Status: DC | PRN
  Administered 2011-10-18: 12:00:00

## 2011-10-18 MED ORDER — HETASTARCH-ELECTROLYTES 6 % IV SOLN
INTRAVENOUS | Status: DC | PRN
  Administered 2011-10-18: 12:00:00 via INTRAVENOUS

## 2011-10-18 MED ORDER — ALBUTEROL SULFATE (2.5 MG/3ML) 0.083% IN NEBU
INHALATION_SOLUTION | RESPIRATORY_TRACT | Status: DC | PRN
  Administered 2011-10-18: .65 mg via RESPIRATORY_TRACT

## 2011-10-18 MED ORDER — ONDANSETRON HCL 4 MG/2ML IJ SOLN
4.0000 mg | Freq: Four times a day (QID) | INTRAMUSCULAR | Status: DC | PRN
  Administered 2011-10-19: 4 mg via INTRAVENOUS
  Filled 2011-10-18: qty 2

## 2011-10-18 MED ORDER — LACTATED RINGERS IV SOLN
INTRAVENOUS | Status: DC | PRN
  Administered 2011-10-18 (×3): via INTRAVENOUS

## 2011-10-18 MED ORDER — CALCIUM CARBONATE-VITAMIN D 500-200 MG-UNIT PO TABS
1.0000 | ORAL_TABLET | Freq: Every day | ORAL | Status: DC
  Administered 2011-10-19 – 2011-10-20 (×2): 1 via ORAL
  Filled 2011-10-18 (×2): qty 1

## 2011-10-18 MED ORDER — ACETAMINOPHEN 650 MG RE SUPP: 650.0000 mg | Freq: Four times a day (QID) | RECTAL | Status: DC | PRN

## 2011-10-18 MED ORDER — ASPIRIN EC 81 MG PO TBEC
81.0000 mg | DELAYED_RELEASE_TABLET | Freq: Every day | ORAL | Status: DC
  Administered 2011-10-19 – 2011-10-20 (×2): 81 mg via ORAL
  Filled 2011-10-18 (×2): qty 1

## 2011-10-18 MED ORDER — ROCURONIUM BROMIDE 100 MG/10ML IV SOLN
INTRAVENOUS | Status: DC | PRN
  Administered 2011-10-18: 20 mg via INTRAVENOUS

## 2011-10-18 MED ORDER — SODIUM CHLORIDE 0.9 % IR SOLN
Status: DC | PRN
  Administered 2011-10-18: 500 mL

## 2011-10-18 MED ORDER — TECHNETIUM TC 99M SULFUR COLLOID FILTERED
1.0000 | Freq: Once | INTRAVENOUS | Status: AC | PRN
  Administered 2011-10-18: 1 via INTRADERMAL

## 2011-10-18 MED ORDER — MIDAZOLAM HCL 5 MG/5ML IJ SOLN
INTRAMUSCULAR | Status: DC | PRN
  Administered 2011-10-18: 1 mg via INTRAVENOUS

## 2011-10-18 MED ORDER — SUCCINYLCHOLINE CHLORIDE 20 MG/ML IJ SOLN
INTRAMUSCULAR | Status: DC | PRN
  Administered 2011-10-18: 100 mg via INTRAVENOUS

## 2011-10-18 MED ORDER — EPHEDRINE SULFATE 50 MG/ML IJ SOLN
INTRAMUSCULAR | Status: DC | PRN
  Administered 2011-10-18: 10 mg via INTRAVENOUS
  Administered 2011-10-18: 15 mg via INTRAVENOUS

## 2011-10-18 MED ORDER — TIOTROPIUM BROMIDE MONOHYDRATE 18 MCG IN CAPS
18.0000 ug | ORAL_CAPSULE | Freq: Every day | RESPIRATORY_TRACT | Status: DC
  Administered 2011-10-19 – 2011-10-20 (×2): 18 ug via RESPIRATORY_TRACT
  Filled 2011-10-18: qty 5

## 2011-10-18 MED ORDER — PROPOFOL 10 MG/ML IV EMUL
INTRAVENOUS | Status: DC | PRN
  Administered 2011-10-18: 200 mg via INTRAVENOUS

## 2011-10-18 MED ORDER — SIMVASTATIN 20 MG PO TABS
20.0000 mg | ORAL_TABLET | Freq: Every day | ORAL | Status: DC
  Administered 2011-10-18 – 2011-10-19 (×2): 20 mg via ORAL
  Filled 2011-10-18 (×3): qty 1

## 2011-10-18 MED ORDER — KCL IN DEXTROSE-NACL 20-5-0.9 MEQ/L-%-% IV SOLN
INTRAVENOUS | Status: DC
  Administered 2011-10-18 – 2011-10-19 (×2): via INTRAVENOUS
  Filled 2011-10-18 (×5): qty 1000

## 2011-10-18 MED ORDER — FENTANYL CITRATE 0.05 MG/ML IJ SOLN
INTRAMUSCULAR | Status: DC | PRN
  Administered 2011-10-18: 25 ug via INTRAVENOUS
  Administered 2011-10-18 (×2): 50 ug via INTRAVENOUS
  Administered 2011-10-18: 200 ug via INTRAVENOUS
  Administered 2011-10-18: 50 ug via INTRAVENOUS
  Administered 2011-10-18: 100 ug via INTRAVENOUS
  Administered 2011-10-18: 25 ug via INTRAVENOUS
  Administered 2011-10-18: 100 ug via INTRAVENOUS
  Administered 2011-10-18: 50 ug via INTRAVENOUS
  Administered 2011-10-18: 100 ug via INTRAVENOUS
  Administered 2011-10-18: 25 ug via INTRAVENOUS
  Administered 2011-10-18: 50 ug via INTRAVENOUS

## 2011-10-18 MED ORDER — HYDROCODONE-ACETAMINOPHEN 5-325 MG PO TABS: 1.0000 | ORAL_TABLET | ORAL | Status: DC | PRN

## 2011-10-18 MED ORDER — ONDANSETRON HCL 4 MG PO TABS: 4.0000 mg | ORAL_TABLET | Freq: Four times a day (QID) | ORAL | Status: DC | PRN

## 2011-10-18 MED ORDER — ALBUTEROL SULFATE HFA 108 (90 BASE) MCG/ACT IN AERS
2.0000 | INHALATION_SPRAY | Freq: Four times a day (QID) | RESPIRATORY_TRACT | Status: DC | PRN
  Filled 2011-10-18: qty 6.7

## 2011-10-18 MED ORDER — LABETALOL HCL 5 MG/ML IV SOLN
INTRAVENOUS | Status: DC | PRN
  Administered 2011-10-18 (×2): 5 mg via INTRAVENOUS

## 2011-10-18 MED ORDER — ONDANSETRON HCL 4 MG/2ML IJ SOLN
INTRAMUSCULAR | Status: DC | PRN
  Administered 2011-10-18 (×2): 4 mg via INTRAVENOUS

## 2011-10-18 MED ORDER — CEFAZOLIN SODIUM 1-5 GM-% IV SOLN
1.0000 g | Freq: Three times a day (TID) | INTRAVENOUS | Status: DC
  Administered 2011-10-18 – 2011-10-20 (×6): 1 g via INTRAVENOUS
  Filled 2011-10-18 (×8): qty 50

## 2011-10-18 MED ORDER — MORPHINE SULFATE 4 MG/ML IJ SOLN
4.0000 mg | INTRAMUSCULAR | Status: DC | PRN
  Administered 2011-10-18: 4 mg via INTRAVENOUS
  Administered 2011-10-18: 2 mg via INTRAVENOUS
  Administered 2011-10-19 – 2011-10-20 (×6): 4 mg via INTRAVENOUS
  Administered 2011-10-20 (×2): 2 mg via INTRAVENOUS
  Filled 2011-10-18 (×10): qty 1

## 2011-10-18 SURGICAL SUPPLY — 86 items
ADH SKN CLS APL DERMABOND .7 (GAUZE/BANDAGES/DRESSINGS) ×1
APPLIER CLIP 9.375 MED OPEN (MISCELLANEOUS) ×8
APR CLP MED 9.3 20 MLT OPN (MISCELLANEOUS) ×2
BAG DECANTER FOR FLEXI CONT (MISCELLANEOUS) ×4 IMPLANT
BANDAGE ELASTIC 6 VELCRO ST LF (GAUZE/BANDAGES/DRESSINGS) ×4 IMPLANT
BINDER BREAST LRG (GAUZE/BANDAGES/DRESSINGS) ×4 IMPLANT
BINDER BREAST XLRG (GAUZE/BANDAGES/DRESSINGS) IMPLANT
BINDER BREAST XXLRG (GAUZE/BANDAGES/DRESSINGS) ×4 IMPLANT
BIOPATCH RED 1 DISK 7.0 (GAUZE/BANDAGES/DRESSINGS) ×8 IMPLANT
BLADE SURG 15 STRL LF DISP TIS (BLADE) ×3 IMPLANT
BLADE SURG 15 STRL SS (BLADE) ×4
CANISTER SUCTION 2500CC (MISCELLANEOUS) ×8 IMPLANT
CATH FOLEY 2WAY SLVR  5CC 14FR (CATHETERS) ×1
CATH FOLEY 2WAY SLVR 5CC 14FR (CATHETERS) ×3 IMPLANT
CHLORAPREP W/TINT 26ML (MISCELLANEOUS) ×4 IMPLANT
CLIP APPLIE 9.375 MED OPEN (MISCELLANEOUS) ×6 IMPLANT
CLOTH BEACON ORANGE TIMEOUT ST (SAFETY) ×8 IMPLANT
CONT SPEC 4OZ CLIKSEAL STRL BL (MISCELLANEOUS) ×12 IMPLANT
COVER PROBE W GEL 5X96 (DRAPES) ×4 IMPLANT
COVER SURGICAL LIGHT HANDLE (MISCELLANEOUS) ×8 IMPLANT
DERMABOND ADVANCED (GAUZE/BANDAGES/DRESSINGS) ×1
DERMABOND ADVANCED .7 DNX12 (GAUZE/BANDAGES/DRESSINGS) ×3 IMPLANT
DRAIN CHANNEL 19F RND (DRAIN) ×8 IMPLANT
DRAPE LAPAROSCOPIC ABDOMINAL (DRAPES) IMPLANT
DRAPE ORTHO SPLIT 77X108 STRL (DRAPES) ×4
DRAPE PROXIMA HALF (DRAPES) ×12 IMPLANT
DRAPE SURG ORHT 6 SPLT 77X108 (DRAPES) ×6 IMPLANT
DRAPE UTILITY 15X26 W/TAPE STR (DRAPE) ×8 IMPLANT
DRAPE WARM FLUID 44X44 (DRAPE) ×4 IMPLANT
DRSG PAD ABDOMINAL 8X10 ST (GAUZE/BANDAGES/DRESSINGS) ×4 IMPLANT
ELECT CAUTERY BLADE 6.4 (BLADE) ×8 IMPLANT
ELECT REM PT RETURN 9FT ADLT (ELECTROSURGICAL) ×4
ELECTRODE REM PT RTRN 9FT ADLT (ELECTROSURGICAL) ×3 IMPLANT
EVACUATOR SILICONE 100CC (DRAIN) ×8 IMPLANT
GAUZE XEROFORM 5X9 LF (GAUZE/BANDAGES/DRESSINGS) IMPLANT
GLOVE BIO SURGEON STRL SZ 6.5 (GLOVE) ×4 IMPLANT
GLOVE BIO SURGEON STRL SZ7.5 (GLOVE) ×12 IMPLANT
GLOVE BIOGEL PI IND STRL 7.0 (GLOVE) ×3 IMPLANT
GLOVE BIOGEL PI IND STRL 7.5 (GLOVE) ×6 IMPLANT
GLOVE BIOGEL PI INDICATOR 7.0 (GLOVE) ×1
GLOVE BIOGEL PI INDICATOR 7.5 (GLOVE) ×2
GLOVE ECLIPSE 6.5 STRL STRAW (GLOVE) ×4 IMPLANT
GLOVE SURG ORTHO 8.0 STRL STRW (GLOVE) ×8 IMPLANT
GOWN BRE IMP SLV AUR LG STRL (GOWN DISPOSABLE) ×4 IMPLANT
GOWN PREVENTION PLUS XLARGE (GOWN DISPOSABLE) ×8 IMPLANT
GOWN STRL NON-REIN LRG LVL3 (GOWN DISPOSABLE) ×12 IMPLANT
GRAFT FLEX HD 4X16 THICK (Tissue Mesh) ×8 IMPLANT
KIT BASIN OR (CUSTOM PROCEDURE TRAY) ×4 IMPLANT
KIT ROOM TURNOVER OR (KITS) ×4 IMPLANT
NEEDLE 18GX1X1/2 (RX/OR ONLY) (NEEDLE) ×4 IMPLANT
NEEDLE HYPO 25GX1X1/2 BEV (NEEDLE) ×4 IMPLANT
NEEDLE HYPO 25X1 1.5 SAFETY (NEEDLE) ×4 IMPLANT
NS IRRIG 1000ML POUR BTL (IV SOLUTION) ×16 IMPLANT
PACK GENERAL/GYN (CUSTOM PROCEDURE TRAY) ×8 IMPLANT
PAD ARMBOARD 7.5X6 YLW CONV (MISCELLANEOUS) ×16 IMPLANT
PEN SKIN MARKING BROAD (MISCELLANEOUS) ×4 IMPLANT
PENCIL BUTTON HOLSTER BLD 10FT (ELECTRODE) ×4 IMPLANT
PIN SAFETY STERILE (MISCELLANEOUS) ×4 IMPLANT
SHEARS HARMONIC 9CM CVD (BLADE) IMPLANT
SPECIMEN JAR LARGE (MISCELLANEOUS) IMPLANT
SPECIMEN JAR MEDIUM (MISCELLANEOUS) ×4 IMPLANT
SPECIMEN JAR X LARGE (MISCELLANEOUS) ×8 IMPLANT
SPONGE GAUZE 4X4 12PLY (GAUZE/BANDAGES/DRESSINGS) IMPLANT
STAPLER VISISTAT 35W (STAPLE) ×4 IMPLANT
STRIP CLOSURE SKIN 1/2X4 (GAUZE/BANDAGES/DRESSINGS) IMPLANT
SUT ETHILON 3 0 FSL (SUTURE) IMPLANT
SUT MNCRL AB 3-0 PS2 18 (SUTURE) ×4 IMPLANT
SUT MNCRL AB 4-0 PS2 18 (SUTURE) ×8 IMPLANT
SUT MON AB 4-0 PC3 18 (SUTURE) IMPLANT
SUT MON AB 5-0 PS2 18 (SUTURE) ×8 IMPLANT
SUT PDS AB 2-0 CT1 27 (SUTURE) ×16 IMPLANT
SUT SILK 0 FSL (SUTURE) ×8 IMPLANT
SUT SILK 3 0 SH 30 (SUTURE) ×8 IMPLANT
SUT VIC AB 2-0 SH 18 (SUTURE) IMPLANT
SUT VIC AB 3-0 54X BRD REEL (SUTURE) ×3 IMPLANT
SUT VIC AB 3-0 BRD 54 (SUTURE) ×3
SUT VIC AB 3-0 SH 18 (SUTURE) ×4 IMPLANT
SUT VIC AB 3-0 SH 27 (SUTURE) ×11
SUT VIC AB 3-0 SH 27X BRD (SUTURE) ×9 IMPLANT
SUT VIC AB 3-0 SH 27XBRD (SUTURE) ×3 IMPLANT
SUT VICRYL 4-0 PS2 18IN ABS (SUTURE) ×8 IMPLANT
SYR CONTROL 10ML LL (SYRINGE) ×8 IMPLANT
TISSUE EXPANDER 350CC (Miscellaneous) ×8 IMPLANT
TOWEL OR 17X24 6PK STRL BLUE (TOWEL DISPOSABLE) ×8 IMPLANT
TOWEL OR 17X26 10 PK STRL BLUE (TOWEL DISPOSABLE) ×8 IMPLANT
WATER STERILE IRR 1000ML POUR (IV SOLUTION) IMPLANT

## 2011-10-18 NOTE — Preoperative (Signed)
Beta Blockers   Reason not to administer Beta Blockers:Not Applicable. No home beta blockers 

## 2011-10-18 NOTE — OR Nursing (Signed)
1st procedure ended at 51. Dr.Sanger started 2nd procedure at 68.

## 2011-10-18 NOTE — Progress Notes (Signed)
Call to Dr. Kelly Splinter for order for surgery.

## 2011-10-18 NOTE — Anesthesia Postprocedure Evaluation (Signed)
  Anesthesia Post-op Note  Patient: Jamie [name]  Procedure(s) Performed: Procedure(s) (LRB): MASTECTOMY WITH SENTINEL LYMPH NODE BIOPSY (Bilateral) BREAST RECONSTRUCTION (Bilateral) AXILLARY LYMPH NODE DISSECTION (Left)  Patient Location: PACU  Anesthesia Type: General  Level of Consciousness: awake  Airway and Oxygen Therapy: Patient Spontanous Breathing  Post-op Pain: mild  Post-op Assessment: Post-op Vital signs reviewed  Post-op Vital Signs: stable  Complications: No apparent anesthesia complications

## 2011-10-18 NOTE — Brief Op Note (Signed)
10/18/2011  3:48 PM  PATIENT:  Jamie [name]  66 y.o. female  PRE-OPERATIVE DIAGNOSIS:  left breast cancer  POST-OPERATIVE DIAGNOSIS:  left breast cancer  PROCEDURE:  Procedure(s) (LRB): IMMEDIATE BILATERAL BREAST RECONSTRUCTION WITH EXPANDERS AND FLEX HD PLACMENT  SURGEON:  Surgeon(s) and Role: Panel 1:    * Robyne Askew, MD - Primary    * Velora Heckler, MD - Assisting  Panel 2:    * Elvie Maines Sanger, DO - Primary  PHYSICIAN ASSISTANT:   ASSISTANTS: none   ANESTHESIA:   general  EBL:  Total I/O In: 2500 [I.V.:2000; IV Piggyback:500] Out: 220 [Urine:220]  BLOOD ADMINISTERED:none  DRAINS: (2) Jackson-Pratt drain(s) with closed bulb suction in the breast pockets   LOCAL MEDICATIONS USED:  NONE  SPECIMEN:  No Specimen  DISPOSITION OF SPECIMEN:  N/A  COUNTS:  YES  TOURNIQUET:  * No tourniquets in log *  DICTATION: dictated  PLAN OF CARE: Admit to inpatient   PATIENT DISPOSITION:  PACU - hemodynamically stable.   Delay start of Pharmacological VTE agent (>24hrs) due to surgical blood loss or risk of bleeding: no

## 2011-10-18 NOTE — Progress Notes (Signed)
Dr. Randa Evens to bedside.  Patient BP is 198/95, she will call Dr. Carolynne Edouard.  Patient also sounds slightly wet, this was conveyed to Dr. Randa Evens as well.

## 2011-10-18 NOTE — Anesthesia Preprocedure Evaluation (Addendum)
Anesthesia Evaluation  Patient identified by MRN, date of birth, ID band Patient awake    Reviewed: Allergy & Precautions, H&P , NPO status , Patient's Chart, lab work & pertinent test results, reviewed documented beta blocker date and time   History of Anesthesia Complications (+) AWARENESS UNDER ANESTHESIA  Airway Mallampati: II TM Distance: >3 FB Neck ROM: full    Dental  (+) Teeth Intact and Dental Advisory Given   Pulmonary shortness of breath, asthma , sleep apnea , COPD COPD inhaler,          Cardiovascular Rhythm:regular Rate:Normal     Neuro/Psych  Headaches,    GI/Hepatic   Endo/Other  Morbid obesity  Renal/GU      Musculoskeletal   Abdominal (+) + obese,   Peds  Hematology   Anesthesia Other Findings   Reproductive/Obstetrics                          Anesthesia Physical Anesthesia Plan  ASA: III  Anesthesia Plan: General and General ETT   Post-op Pain Management:    Induction: Intravenous  Airway Management Planned: Oral ETT  Additional Equipment:   Intra-op Plan:   Post-operative Plan: Extubation in OR  Informed Consent: I have reviewed the patients History and Physical, chart, labs and discussed the procedure including the risks, benefits and alternatives for the proposed anesthesia with the patient or authorized representative who has indicated his/her understanding and acceptance.     Plan Discussed with: CRNA, Anesthesiologist and Surgeon  Anesthesia Plan Comments:        Anesthesia Quick Evaluation

## 2011-10-18 NOTE — Transfer of Care (Signed)
Immediate Anesthesia Transfer of Care Note  Patient: Jamie [name]  Procedure(s) Performed: Procedure(s) (LRB): MASTECTOMY WITH SENTINEL LYMPH NODE BIOPSY (Bilateral) BREAST RECONSTRUCTION (Bilateral) AXILLARY LYMPH NODE DISSECTION (Left)  Patient Location: PACU  Anesthesia Type: General  Level of Consciousness: lethargic and responds to stimulation  Airway & Oxygen Therapy: Patient Spontanous Breathing and Patient connected to face mask oxygen  Post-op Assessment: Report given to PACU RN  Post vital signs: Reviewed and stable  Complications: No apparent anesthesia complications

## 2011-10-18 NOTE — H&P (Signed)
[name]   08/26/2011 4:00 PM Office Visit  MRN: [id number]   Description: 66 year old female  Provider: Robyne Askew, MD  Department: [facility]        Diagnoses     Left breast mass   - Primary    611.72      Reason for Visit     Other    Eval of left breast mass       Reason For Visit History Recorded        Vitals - Last Recorded       BP Pulse Temp(Src) Resp Ht Wt    126/74  70  98.3 F (36.8 C) (Temporal)  18  5\' 6"  (1.676 m)  274 lb 12.8 oz (124.648 kg)          BMI              44.35 kg/m2                 Progress Notes     Robyne Askew, MD  08/26/2011  4:57 PM  SignedSubjective:      Patient ID: Jamie [name], female   DOB: 04-12-1946, 66 y.o.   MRN: [id number]   HPI We are asked to see the patient in consultation by Dr. Joetta Manners to evaluate her for a left breast mass. The patient is a 67 year old white female who first noticed a lump in her left breast about 3 weeks ago. She felt this in the shower while she was washing. She had also been sick with an upper respiratory infection about the same time. Since noticing it she does feel some discomfort there. She denies any discharge from the nipple on either side. She does have an aunt that has breast cancer. Otherwise she has no history of breast problems. She brought this mass to her doctor's attention. She underwent mammograms and ultrasounds that showed the area to be about 3 cm in diameter. It does appear as though the mass was present on her mammogram from July of 2012   Review of Systems  Constitutional: Negative.   HENT: Negative.   Eyes: Negative.   Respiratory: Negative.   Cardiovascular: Negative.   Gastrointestinal: Negative.   Genitourinary: Negative.   Musculoskeletal: Negative.   Skin: Negative.   Neurological: Negative.   Hematological: Negative.   Psychiatric/Behavioral: Negative.       Objective:    Physical Exam  Constitutional: She is oriented to  person, place, and time. She appears well-developed and well-nourished.  HENT:   Head: Normocephalic and atraumatic.  Eyes: Conjunctivae and EOM are normal. Pupils are equal, round, and reactive to light.  Neck: Normal range of motion. Neck supple.  Cardiovascular: Normal rate, regular rhythm and normal heart sounds.   Pulmonary/Chest: Effort normal and breath sounds normal.       The patient has a approximately 3 cm mass in the 12:00 position of the left breast. It is mobile. It does not appear to be tethered to the skin of the chest wall. There is no palpable mass in the right breast other than just some small nodularity diffusely. No palpable axillary supraclavicular or cervical lymphadenopathy  Abdominal: Soft. Bowel sounds are normal. She exhibits no mass. There is no tenderness.  Musculoskeletal: Normal range of motion.  Neurological: She is alert and oriented to person, place, and time.  Skin: Skin is warm and dry.  Psychiatric: She has a normal mood and affect.  Her behavior is normal.      Assessment:    Large palpable mass in the 12:00 position of the left breast   Plan:    The mass looked suspicious for breast cancer. I have looked at the x-rays with the radiologist at the breast center and they concur. We will have her go to the breast center today for further evaluation. She will definitely need a core needle biopsy of this lesion before we can determine what needs to be done. We will plan to see her back in next few days to go over the results of her biopsy.                Not recorded              Level of Service     PR OFFICE CONSULTATION NEW/ESTAB PATIENT 60 MIN [99244]      Follow-up and Disposition     Return in about 1 week (around 09/02/2011).        All Flowsheet Templates (all recorded)     Encounter Vitals Flowsheet    Custom Formula Data Flowsheet    Anthropometrics Flowsheet                      All Charges for This Encounter         Code Description Service Date Service Provider Modifiers Quantity    [phone] PR OFFICE OUTPATIENT NEW 45 MINUTES 08/26/2011 Robyne Askew, MD   1    [phone] PR CURRENT TOBACCO NON-USER 08/26/2011 Robyne Askew, MD   1        Other Encounter Related Information     Allergies & Medications         Problem List         History         Patient-Entered Questionnaires     No data filed

## 2011-10-18 NOTE — Anesthesia Procedure Notes (Signed)
Procedure Name: Intubation Date/Time: 10/18/2011 11:26 AM Performed by: Mancel Parsons Pre-anesthesia Checklist: Patient identified, Emergency Drugs available, Suction available, Patient being monitored and Timeout performed Patient Re-evaluated:Patient Re-evaluated prior to inductionOxygen Delivery Method: Circle system utilized Preoxygenation: Pre-oxygenation with 100% oxygen Intubation Type: IV induction Ventilation: Mask ventilation without difficulty and Oral airway inserted - appropriate to patient size Laryngoscope Size: Mac and 3 Grade View: Grade III Tube type: Oral Tube size: 7.5 mm Number of attempts: 2 Airway Equipment and Method: Bougie stylet and LTA kit utilized Placement Confirmation: ETT inserted through vocal cords under direct vision,  positive ETCO2 and breath sounds checked- equal and bilateral Secured at: 21 cm Tube secured with: Tape Dental Injury: Injury to lip  Difficulty Due To: Difficulty was unanticipated and Difficult Airway- due to limited oral opening

## 2011-10-18 NOTE — Interval H&P Note (Signed)
History and Physical Interval Note:  10/18/2011 7:02 AM  Jamie [name]  has presented today for surgery, with the diagnosis of left breast cancer  The various methods of treatment have been discussed with the patient and family. After consideration of risks, benefits and other options for treatment, the patient has consented to  Procedure(s) (LRB): Bilateral MASTECTOMY WITH Left SENTINEL LYMPH NODE BIOPSY BREAST RECONSTRUCTION (Bilateral) as a surgical intervention .  The patients' history has been reviewed, patient examined, no change in status, stable for surgery.  I have reviewed the patients' chart and labs.  Questions were answered to the patient's satisfaction.     TOTH III,Jireh Vinas S

## 2011-10-18 NOTE — Op Note (Signed)
10/18/2011  1:59 PM  PATIENT:  Jamie [name]  66 y.o. female  PRE-OPERATIVE DIAGNOSIS:  left breast cancer  POST-OPERATIVE DIAGNOSIS:  left breast cancer  PROCEDURE:  Procedure(s) (LRB): MASTECTOMY WITH SENTINEL LYMPH NODE BIOPSY (Bilateral) BREAST RECONSTRUCTION (Bilateral)  SURGEON:  Surgeon(s) and Role: Panel 1:    * Robyne Askew, MD - Primary    * Velora Heckler, MD - Assisting  Panel 2:    * Wayland Denis, DO - Primary  PHYSICIAN ASSISTANT:   ASSISTANTS: Dr. Gerrit Friends   ANESTHESIA:   general  EBL:  Total I/O In: 2500 [I.V.:2000; IV Piggyback:500] Out: 100 [Urine:100]  BLOOD ADMINISTERED:none  DRAINS: none   LOCAL MEDICATIONS USED:  NONE  SPECIMEN:  Source of Specimen:  both breasts and left axillary contents  DISPOSITION OF SPECIMEN:  PATHOLOGY  COUNTS:  YES  TOURNIQUET:  * No tourniquets in log *  DICTATION: .Dragon Dictation After informed consent was obtained patient was brought to the operating room and placed in the supine position on the operating room table. After adequate induction of general anesthesia the patient's bilateral chest breast and axillary area were prepped with ChloraPrep, allowed to dry, and draped in usual sterile manner. Earlier in the day the patient underwent injection of 1 mCi of technetium sulfur colloid in the subareolar position of the left breast. At this 0.2 cc of methylene blue and 3 cc of injectable saline were also injected in the subareolar position of the left breast. Elliptical incisions were then mapped out around the nipple areolar complex and a symmetric fashion. Incision the incision was then made on the right breast along the mapped out lines with a 10 blade knife. Skin hooks were used to elevate the skin flaps anteriorly towards the ceiling. Skin flaps were created circumferentially around the incision until the dissection reached the chest wall superiorly medially and inferiorly. Laterally the dissection was carried down  to the latissimus muscle. Once this was accomplished the breast was removed from the chest wall with the pectoralis fascia. this was all done sharply with the electrocautery.the breast was oriented with a stitch on the lateral aspect and sent to pathology for further evaluation. Hemostasis was achieved using the Bovie electrocautery. The wound was irrigated with copious amounts of saline. The wound was packed with moistened lap sponge. Attention was then turned to the left breast. Again the incision was made with a 10 blade knife along the mapped outlines. Breast at 3 used to elevate the skin flaps towards the ceiling. Thin skin flaps were created circumferentially around the incision until the dissection met the chest wall superiorly medially and inferiorly. Laterally the dissection was carried down to the latissimus muscle. Once we reached the axilla we used the neoprobe to identify a hot spot in the left axilla. 3 hot lymph nodes were identified that were excised sharply with the electrocautery. Touch preps on the first node were positive for metastatic breast cancer. The breast was removed from the chest wall with the pectoralis fascia sharply with the electrocautery. The breast was oriented with a stitch on the lateral aspect and sent to pathology. Next the boundaries of the axilla were identified. We identified the left axillary vein running across the top of the axilla. Some of the vessels coming off the vein were controlled with clips. The contents of the left axilla were then teased out with blunt right angle dissection. the long thoracic and thoracodorsal nerves were also identified and spared. Once this was  accomplished the contents of the left axilla were sent to pathology for further evaluation. The wounds were irrigated with copious amounts of saline. The wounds were found to be hemostatic. The wound was packed with a moistened lap sponge. At this point the operation was turned over to Dr. Shella Spearing  for the reconstruction. The patient was in stable condition.  PLAN OF CARE: Admit for overnight observation  PATIENT DISPOSITION:  PACU - hemodynamically stable.   Delay start of Pharmacological VTE agent (>24hrs) due to surgical blood loss or risk of bleeding: yes

## 2011-10-19 MED ORDER — OXYCODONE HCL 5 MG PO TABS
5.0000 mg | ORAL_TABLET | ORAL | Status: DC | PRN
  Administered 2011-10-19 – 2011-10-20 (×3): 5 mg via ORAL
  Filled 2011-10-19 (×3): qty 1

## 2011-10-19 MED ORDER — OXYCODONE HCL 10 MG PO TB12
10.0000 mg | ORAL_TABLET | Freq: Two times a day (BID) | ORAL | Status: DC
  Administered 2011-10-20: 10 mg via ORAL
  Filled 2011-10-19 (×2): qty 1

## 2011-10-19 MED ORDER — OXYCODONE HCL 10 MG PO TB12
10.0000 mg | ORAL_TABLET | Freq: Two times a day (BID) | ORAL | Status: DC | PRN
  Administered 2011-10-19: 10 mg via ORAL
  Filled 2011-10-19: qty 1

## 2011-10-19 NOTE — Progress Notes (Signed)
1 Day Post-Op  Subjective: The patient is post operative from mastectomies and reconstruction.  Friends are at the bedside.  She is feeling much better today and moving around in the room.  Objective: Vital signs in last 24 hours: Temp:  [97.3 F (36.3 C)-98.4 F (36.9 C)] 98.4 F (36.9 C) (03/16 1045) Pulse Rate:  [79-97] 86  (03/16 1045) Resp:  [16-23] 18  (03/16 1045) BP: (122-157)/(56-92) 137/57 mmHg (03/16 1045) SpO2:  [88 %-97 %] 94 % (03/16 1045) Weight:  [127.551 kg (281 lb 3.2 oz)] 127.551 kg (281 lb 3.2 oz) (03/15 1930) Last BM Date: 10/17/11  Intake/Output from previous day: 03/15 0701 - 03/16 0700 In: 3932 [I.V.:3432; IV Piggyback:500] Out: 1467 [Urine:1070; Drains:397] Intake/Output this shift: Total I/O In: 1367.5 [P.O.:600; I.V.:667.5; Other:100] Out: 110 [Drains:110]  General appearance: alert, cooperative and no distress Incision/Wound: Flaps are warm and dry.  Lab Results:  No results found for this basename: WBC:2,HGB:2,HCT:2,PLT:2 in the last 72 hours BMET No results found for this basename: NA:2,K:2,CL:2,CO2:2,GLUCOSE:2,BUN:2,CREATININE:2,CALCIUM:2 in the last 72 hours PT/INR No results found for this basename: LABPROT:2,INR:2 in the last 72 hours ABG No results found for this basename: PHART:2,PCO2:2,PO2:2,HCO3:2 in the last 72 hours  Studies/Results: Nm Sentinel Node Inj-no Rpt (breast)  10/18/2011  CLINICAL DATA: left breast cancer   Sulfur colloid was injected intradermally by the nuclear medicine  technologist for breast cancer sentinel node localization.      Anti-infectives: Anti-infectives     Start     Dose/Rate Route Frequency Ordered Stop   10/18/11 2200   ceFAZolin (ANCEF) IVPB 1 g/50 mL premix        1 g 100 mL/hr over 30 Minutes Intravenous 3 times per day 10/18/11 1834     10/18/11 1248   polymyxin B 500,000 Units, bacitracin 50,000 Units in sodium chloride irrigation 0.9 % 500 mL irrigation  Status:  Discontinued          As  needed 10/18/11 1249 10/18/11 1600   10/17/11 1011   ceFAZolin (ANCEF) IVPB 2 g/50 mL premix        2 g 100 mL/hr over 30 Minutes Intravenous 60 min pre-op 10/17/11 1011 10/18/11 1131          Assessment/Plan: s/p Procedure(s) (LRB): MASTECTOMY WITH SENTINEL LYMPH NODE BIOPSY (Bilateral) BREAST RECONSTRUCTION (Bilateral) AXILLARY LYMPH NODE DISSECTION (Left) Plan for discharge tomorrow  LOS: 1 day    [facility] 10/19/2011

## 2011-10-19 NOTE — Progress Notes (Signed)
Patient ID: Jamie [name], female   DOB: [birth year], 66 y.o.   MRN: [id number] 1 Day Post-Op  Subjective: "rough night" incisional pain but otherwise okay. Objective: Vital signs in last 24 hours: Temp:  [97.2 F (36.2 C)-98.4 F (36.9 C)] 98.4 F (36.9 C) (03/16 0534) Pulse Rate:  [79-100] 79  (03/16 0534) Resp:  [16-23] 20  (03/16 0534) BP: (122-198)/(56-95) 142/69 mmHg (03/16 0534) SpO2:  [93 %-100 %] 96 % (03/16 0534) Weight:  [281 lb 3.2 oz (127.551 kg)] 281 lb 3.2 oz (127.551 kg) (03/15 1930) Last BM Date: 10/17/11  Intake/Output from previous day: 03/15 0701 - 03/16 0700 In: 3932 [I.V.:3432; IV Piggyback:500] Out: 1467 [Urine:1070; Drains:397] Intake/Output this shift:    General appearance: alert and mild distress Incision/Wound: dressings dry and intact and no unusual bleeding or swelling.  Lab Results:  No results found for this basename: WBC:2,HGB:2,HCT:2,PLT:2 in the last 72 hours BMET No results found for this basename: NA:2,K:2,CL:2,CO2:2,GLUCOSE:2,BUN:2,CREATININE:2,CALCIUM:2 in the last 72 hours   Studies/Results: Nm Sentinel Node Inj-no Rpt (breast)  10/18/2011  CLINICAL DATA: left breast cancer   Sulfur colloid was injected intradermally by the nuclear medicine  technologist for breast cancer sentinel node localization.      Anti-infectives: Anti-infectives     Start     Dose/Rate Route Frequency Ordered Stop   10/18/11 2200   ceFAZolin (ANCEF) IVPB 1 g/50 mL premix        1 g 100 mL/hr over 30 Minutes Intravenous 3 times per day 10/18/11 1834     10/18/11 1248   polymyxin B 500,000 Units, bacitracin 50,000 Units in sodium chloride irrigation 0.9 % 500 mL irrigation  Status:  Discontinued          As needed 10/18/11 1249 10/18/11 1600   10/17/11 1011   ceFAZolin (ANCEF) IVPB 2 g/50 mL premix        2 g 100 mL/hr over 30 Minutes Intravenous 60 min pre-op 10/17/11 1011 10/18/11 1131          Assessment/Plan: s/p Procedure(s): MASTECTOMY WITH  SENTINEL LYMPH NODE BIOPSY BREAST RECONSTRUCTION AXILLARY LYMPH NODE DISSECTION Stable postop. Discharge planning per plastic surgery.   LOS: 1 day    Voris Tigert T 10/19/2011  kj

## 2011-10-20 MED ORDER — OXYCODONE HCL 10 MG PO TB12: 10.0000 mg | ORAL_TABLET | Freq: Two times a day (BID) | ORAL | Status: DC

## 2011-10-20 MED ORDER — OXYCODONE HCL 10 MG PO TABS: 10.0000 mg | ORAL_TABLET | ORAL | Status: DC | PRN

## 2011-10-20 NOTE — Discharge Summary (Signed)
Physician Discharge Summary  Patient ID: Jamie [name] MRN: [id number] DOB/AGE: [birth year] 67 y.o.  Admit date: 10/18/2011 Discharge date: 10/20/2011  Admission Diagnoses:  Discharge Diagnoses:  Active Problems:  * No active hospital problems. *    Discharged Condition: good  Hospital Course: The patient was taken to the OR and underwent bilateral mastectomies with left axillary dissection.  She did well throughout the case and was managed on the surgical unit postoperatively.  Consults: None  Significant Diagnostic Studies: labs: cbc  Treatments: surgery: bilateral mastectomies  Discharge Exam: Blood pressure 134/44, pulse 99, temperature 99.5 F (37.5 C), temperature source Oral, resp. rate 20, height 5\' 5"  (1.651 m), weight 127.551 kg (281 lb 3.2 oz), SpO2 94.00%. General appearance: alert, cooperative and no distress Incision/Wound:  Disposition:   Discharge Orders    Future Appointments: Provider: Department: Dept Phone: Center:   10/30/2011 4:30 PM Lowella Dell, MD [facility] [phone] None     Medication List  As of 10/20/2011  2:19 PM   ASK your doctor about these medications         albuterol 108 (90 BASE) MCG/ACT inhaler   Commonly known as: PROVENTIL HFA;VENTOLIN HFA   Inhale 2 puffs into the lungs every 6 (six) hours as needed. For wheezing      aspirin EC 81 MG tablet   Take 81 mg by mouth daily.      calcium citrate-vitamin D 315-200 MG-UNIT per tablet   Commonly known as: CITRACAL+D   Take 2 tablets by mouth daily.      fish oil-omega-3 fatty acids 1000 MG capsule   Take 3 g by mouth daily.      loratadine 10 MG tablet   Commonly known as: CLARITIN   Take 10 mg by mouth daily.      simvastatin 20 MG tablet   Commonly known as: ZOCOR   Take 20 mg by mouth Daily.      tiotropium 18 MCG inhalation capsule   Commonly known as: SPIRIVA   Place 18 mcg into inhaler and inhale daily.           Follow-up Information    Follow up with  [facility], DO in 1 week.   Contact information:   [address] [place] [address] [phone]          Signed: Wayland Denis 10/20/2011, 2:19 PM

## 2011-10-20 NOTE — Progress Notes (Signed)
Patient ID: Jamie [name], female   DOB: 31-Jul-1946, 66 y.o.   MRN: [id number] 2 Days Post-Op  Subjective: Still some pain but better. No other complaints.  Objective: Vital signs in last 24 hours: Temp:  [98.1 F (36.7 C)-99.5 F (37.5 C)] 99.5 F (37.5 C) (03/17 0510) Pulse Rate:  [80-107] 99  (03/17 0510) Resp:  [16-20] 20  (03/17 0510) BP: (127-149)/(44-67) 134/44 mmHg (03/17 0510) SpO2:  [88 %-96 %] 94 % (03/17 0852) Last BM Date: 10/17/11  Intake/Output from previous day: 03/16 0701 - 03/17 0700 In: 2958.8 [P.O.:1080; I.V.:1778.8] Out: 300 [Drains:300] Intake/Output this shift:    General appearance: alert and no distress Incision/Wound: flaps with some bruising but otherwise okay without swelling or bleeding  Lab Results:  No results found for this basename: WBC:2,HGB:2,HCT:2,PLT:2 in the last 72 hours BMET No results found for this basename: NA:2,K:2,CL:2,CO2:2,GLUCOSE:2,BUN:2,CREATININE:2,CALCIUM:2 in the last 72 hours   Studies/Results: Nm Sentinel Node Inj-no Rpt (breast)  10/18/2011  CLINICAL DATA: left breast cancer   Sulfur colloid was injected intradermally by the nuclear medicine  technologist for breast cancer sentinel node localization.      Anti-infectives: Anti-infectives     Start     Dose/Rate Route Frequency Ordered Stop   10/18/11 2200   ceFAZolin (ANCEF) IVPB 1 g/50 mL premix        1 g 100 mL/hr over 30 Minutes Intravenous 3 times per day 10/18/11 1834     10/18/11 1248   polymyxin B 500,000 Units, bacitracin 50,000 Units in sodium chloride irrigation 0.9 % 500 mL irrigation  Status:  Discontinued          As needed 10/18/11 1249 10/18/11 1600   10/17/11 1011   ceFAZolin (ANCEF) IVPB 2 g/50 mL premix        2 g 100 mL/hr over 30 Minutes Intravenous 60 min pre-op 10/17/11 1011 10/18/11 1131          Assessment/Plan: s/p Procedure(s): MASTECTOMY WITH SENTINEL LYMPH NODE BIOPSY BREAST RECONSTRUCTION AXILLARY LYMPH NODE  DISSECTION Stable. Probable discharge today per plastic surgery.   LOS: 2 days    Elenora Hawbaker T 10/20/2011

## 2011-10-20 NOTE — Progress Notes (Signed)
2 Days Post-Op  Subjective: The patient is doing well, pain controlled and ambulating without difficulty.  She is tolerating food well.  Objective: Vital signs in last 24 hours: Temp:  [98.4 F (36.9 C)-99.5 F (37.5 C)] 99.5 F (37.5 C) (03/17 0510) Pulse Rate:  [96-107] 99  (03/17 0510) Resp:  [18-20] 20  (03/17 0510) BP: (127-149)/(44-54) 134/44 mmHg (03/17 0510) SpO2:  [90 %-96 %] 94 % (03/17 0852) Last BM Date: 10/17/11  Intake/Output from previous day: 03/16 0701 - 03/17 0700 In: 2958.8 [P.O.:1080; I.V.:1778.8] Out: 300 [Drains:300] Intake/Output this shift:    General appearance: alert, cooperative and no distress Incision/Wound:intact  Lab Results:  No results found for this basename: WBC:2,HGB:2,HCT:2,PLT:2 in the last 72 hours BMET No results found for this basename: NA:2,K:2,CL:2,CO2:2,GLUCOSE:2,BUN:2,CREATININE:2,CALCIUM:2 in the last 72 hours PT/INR No results found for this basename: LABPROT:2,INR:2 in the last 72 hours ABG No results found for this basename: PHART:2,PCO2:2,PO2:2,HCO3:2 in the last 72 hours  Studies/Results: No results found.  Anti-infectives: Anti-infectives     Start     Dose/Rate Route Frequency Ordered Stop   10/18/11 2200   ceFAZolin (ANCEF) IVPB 1 g/50 mL premix        1 g 100 mL/hr over 30 Minutes Intravenous 3 times per day 10/18/11 1834     10/18/11 1248   polymyxin B 500,000 Units, bacitracin 50,000 Units in sodium chloride irrigation 0.9 % 500 mL irrigation  Status:  Discontinued          As needed 10/18/11 1249 10/18/11 1600   10/17/11 1011   ceFAZolin (ANCEF) IVPB 2 g/50 mL premix        2 g 100 mL/hr over 30 Minutes Intravenous 60 min pre-op 10/17/11 1011 10/18/11 1131          Assessment/Plan: s/p Procedure(s) (LRB): MASTECTOMY WITH SENTINEL LYMPH NODE BIOPSY (Bilateral) BREAST RECONSTRUCTION (Bilateral) AXILLARY LYMPH NODE DISSECTION (Left) Discharge  LOS: 2 days    [facility] 10/20/2011

## 2011-10-20 NOTE — Discharge Instructions (Signed)
Drain Care daily No heavy lifting

## 2011-10-22 ENCOUNTER — Encounter (HOSPITAL_COMMUNITY): Payer: Self-pay | Admitting: General Surgery

## 2011-10-30 ENCOUNTER — Ambulatory Visit (HOSPITAL_BASED_OUTPATIENT_CLINIC_OR_DEPARTMENT_OTHER): Payer: Medicare Other | Admitting: Oncology

## 2011-10-30 VITALS — BP 153/63 | HR 84 | Temp 98.4°F | Ht 65.0 in | Wt 282.0 lb

## 2011-10-30 DIAGNOSIS — Z170 Estrogen receptor positive status [ER+]: Secondary | ICD-10-CM

## 2011-10-30 DIAGNOSIS — R0602 Shortness of breath: Secondary | ICD-10-CM

## 2011-10-30 DIAGNOSIS — R609 Edema, unspecified: Secondary | ICD-10-CM

## 2011-10-30 DIAGNOSIS — C50919 Malignant neoplasm of unspecified site of unspecified female breast: Secondary | ICD-10-CM

## 2011-10-30 MED ORDER — GABAPENTIN 300 MG PO CAPS: 300.0000 mg | ORAL_CAPSULE | Freq: Three times a day (TID) | ORAL | Status: DC

## 2011-10-30 MED ORDER — OXYCODONE HCL 10 MG PO TB12: 20.0000 mg | ORAL_TABLET | Freq: Three times a day (TID) | ORAL | Status: AC | PRN

## 2011-10-30 NOTE — Progress Notes (Signed)
ID: Jamie [name]   DOB: 01-13-1946  MR#: [id number]  CSN#:[id number]  HISTORY OF PRESENT ILLNESS: The patient noted a mass in her left breast and brought it to the attention of her primary physician, Dr. Joetta [name], who set her up for diagnostic mammography. This showed a mass of about 3 cm in diameter (I do not have a copy of that study or comparison with prior mammography from July of 2012). The patient was referred to Dr. Carolynne [name] and he set her up for biopsy of the mass 08/28/2011 [facility]. The pathology showed [phone]) an invasive ductal carcinoma, grade 2, estrogen receptor 100% positive, progesterone receptor 96% positive, with an MIB-1 of 30%, and HER-2 equivocal with the ratio by CISH of 1.81.  Diagnostic right mammography was then performed 08/28/2011. Multiple masses were noted, and there were multiple soft tissue nodules by palpation. Ultrasound specifically showed 2 hypoechoic round masses measuring 1.3 and 0.9 cm respectively. 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 With this information and after extensive discussion the patient opted for bilateral mastectomies with left sentinel lymph node sampling, and this was performed 10/18/2011, with results and further treatment as discussed below.  INTERVAL HISTORY: The patient returns today with her friend Jamie [name] (who is a former patient of mine) of to review results of her definitive surgery 10/18/2011, and to make a definitive decision regarding adjuvant therapy.  REVIEW OF SYSTEMS: Jamie [name] is in a lot of pain. Sometimes she can get in a position where the pain is almost gone and is back to attend as soon as she moves. She is keeping the left arm fairly frozen and I think this is going to require therapy once she becomes more mobile. There has been some tissue necrosis on the right side and a quite a bit of fluid accumulation requiring drainage, and and on top of that the the  Jackson-Pratt have clogged. She tells me should they have to massage her breast due to the fluid out and it hurts quite a bit. She is on OxyContin and oxycodone with very little relief. But aside from all that she is having bilateral lower extremity edema, continuing issues with shortness of breath and coughing out secondary to her history of asthma and COPD, mild urinary incontinence as previously noted but no symptoms of urinary tract infection at present. She is sleeping in a chair because she can't lie down because of the pain. Detailed review is systems was otherwise stable   PAST MEDICAL HISTORY: Past Medical History  Diagnosis Date  . Left breast mass   . Asthma   . COPD (chronic obstructive pulmonary disease)   . Shortness of breath   . Sleep apnea     sleep study2-3 yrs ago lost weight afterward but gained back  . UTI (lower urinary tract infection)     freq-bladder implant -removed- now using cream  . lt breast ca dx'd 08/2011  . Headache     hx migraines  hx tobacco abuse, 80 P/Y, resolved  PAST SURGICAL HISTORY: Past Surgical History  Procedure Date  . Cesarean section 04/1976  . Abdominal hysterectomy 1977  . Laparoscopic gastric banding 2010  . Nasal sinus surgery   . Tonsillectomy   . Appendectomy   . Mastectomy w/ sentinel node biopsy 10/18/2011    Procedure: MASTECTOMY WITH SENTINEL LYMPH NODE BIOPSY;  Surgeon: Robyne Askew, MD;  Location: [facility];  Service: General;  Laterality: Bilateral;  bilateral mastectomy and left sentinel node biopsy  . Axillary lymph node dissection 10/18/2011    Procedure: AXILLARY LYMPH NODE DISSECTION;  Surgeon: Robyne Askew, MD;  Location: [facility] OR;  Service: General;  Laterality: Left;  . Breast reconstruction 10/18/2011    Procedure: BREAST RECONSTRUCTION;  Surgeon: Wayland Denis, DO;  Location: [facility] OR;  Service: Plastics;  Laterality: Bilateral;   bilateral breast reconstruction with bilateral tissue expander and placement of flex hd.  no  salpingo-oophorectomy  FAMILY HISTORY Family History  Problem Relation Age of Onset  . Anesthesia problems Neg Hx   . Hypotension Neg Hx   . Malignant hyperthermia Neg Hx   . Pseudochol deficiency Neg Hx   The patient's father died at the age of 22. He had a history of asbestos exposure. The patient's mother died at the age of 25 from heart disease. The patient had one brother, no sisters. There is no breast or ovarian cancer in the immediate family but 2 of the patient's 5 maternal aunts had breast cancer (she does not know the age of diagnosis), and one of her father's sisters had ovarian cancer.  GYNECOLOGIC HISTORY: She had menarche age 49, no firm date for her menopause as she had hysterectomy remotely. She is GX P2, with a pair of twins and a daughter as her biological children as noted below. She never took hormone replacement.  SOCIAL HISTORY: She owns a tuxedo shop. Her husband of 34 years, Jamie [name], is retired but used to own a Designer, television/film set. He is currently working in IT with one of his own sons. At home in addition to the patient and her husband is one of the patient's daughters and her 2 children, as well as one of Forrest sons. Altogether they have 6 children and 15 grandchildren. The patient attends [employer]    ADVANCED DIRECTIVES:  HEALTH MAINTENANCE: History  Substance Use Topics  . Smoking status: Former Smoker -- 2.0 packs/day    Quit date: 08/25/2006  . Smokeless tobacco: Never Used   Comment: social drinker 2-3 yr  . Alcohol Use: Yes     rarely     Colonoscopy: 2012  PAP: s/p hysterectomy  Bone density:  Lipid panel:  Allergies  Allergen Reactions  . Vicodin (Hydrocodone-Acetaminophen) Other (See Comments)    syncope  . Sulfur Other (See Comments)    Patient unaware of reaction, happened in childhood.    Current Outpatient Prescriptions  Medication Sig Dispense Refill  . albuterol (PROVENTIL HFA;VENTOLIN HFA) 108 (90 BASE) MCG/ACT inhaler  Inhale 2 puffs into the lungs every 6 (six) hours as needed. For wheezing      . aspirin EC 81 MG tablet Take 81 mg by mouth daily.      . calcium citrate-vitamin D (CITRACAL+D) 315-200 MG-UNIT per tablet Take 2 tablets by mouth daily.       . fish oil-omega-3 fatty acids 1000 MG capsule Take 3 g by mouth daily.       Marland Kitchen loratadine (CLARITIN) 10 MG tablet Take 10 mg by mouth daily.      Marland Kitchen oxyCODONE (OXYCONTIN) 10 MG 12 hr tablet Take 1 tablet (10 mg total) by mouth every 12 (twelve) hours.  20 tablet  0  . Oxycodone HCl 10 MG TABS Take 1 tablet (10 mg total) by mouth every 4 (four) hours as needed.  30 each  0  . simvastatin (ZOCOR) 20 MG tablet Take 20 mg by mouth Daily.       Marland Kitchen  tiotropium (SPIRIVA) 18 MCG inhalation capsule Place 18 mcg into inhaler and inhale daily.        OBJECTIVE: Middle-aged white woman who appears uncomfortable Filed Vitals:   10/30/11 1610  BP: 153/63  Pulse: 84  Temp: 98.4 F (36.9 C)     Body mass index is 46.93 kg/(m^2).    ECOG FS: 2  Sclerae unicteric Oropharynx clear No peripheral adenopathy Lungs no rales or rhonchi Heart regular rate and rhythm Abd benign MSK she is guarding the left shoulder and palpation of the upper portion of the shoulder elicits pain; I do not notice any swelling of the left arm as compared to the right. Neuro: nonfocal Breasts: Status post bilateral mastectomies with expander is in place. There is also a drain on the right. A little bit of the tissue just above the scar on the right appears nonviable  LAB RESULTS: Lab Results  Component Value Date   WBC 7.5 10/14/2011   NEUTROABS 3.0 09/03/2011   HGB 14.1 10/14/2011   HCT 43.6 10/14/2011   MCV 89.3 10/14/2011   PLT 185 10/14/2011      Chemistry      Component Value Date/Time   NA 141 10/14/2011 1540   K 4.7 10/14/2011 1540   CL 103 10/14/2011 1540   CO2 28 10/14/2011 1540   BUN 13 10/14/2011 1540   CREATININE 0.80 10/14/2011 1540      Component Value Date/Time   CALCIUM  9.7 10/14/2011 1540   ALKPHOS 69 09/03/2011 1549   AST 32 09/03/2011 1549   ALT 39* 09/03/2011 1549   BILITOT 0.2* 09/03/2011 1549       Lab Results  Component Value Date   LABCA2 27 09/03/2011    No components found with this basename: [id number]    No results found for this basename: INR:1;PROTIME:1 in the last 168 hours  Urinalysis No results found for this basename: colorurine, appearanceur, labspec, phurine, glucoseu, hgbur, bilirubinur, ketonesur, proteinur, urobilinogen, nitrite, leukocytesur    STUDIES: Dg Chest 2 View  10/14/2011  *RADIOLOGY REPORT*  Clinical Data: Preop radiograph  CHEST - 2 VIEW  Comparison: 09/10/2011  Findings: Normal heart size.  No pleural effusion or pulmonary edema.  No airspace consolidation identified.  Decreased lung volumes and coarsened interstitial markings are noted.  There is spondylosis identified within the thoracic spine.  IMPRESSION:  1.  No acute cardiopulmonary abnormalities.  Original Report Authenticated By: Rosealee Albee, M.D.   ASSESSMENT: 66 year-old [name] woman s/p bilateral mastectomies and left axillary lymph node dissection 10/18/2011 for a left-sided pT2 pN0 invasive ductal carcinoma, grade 2, estrogen receptor 100% and progesterone receptor 96% positive, with no HER-2 amplification (1.81, repeat 1.35), Mib-1 of 30%. The right breast showed no malignancy.  PLAN: We spent the better part of her hour-long visit today discussing her prognosis. The Adjuvant! Program suggests a risk of recurrence in the 46% range (with 32 women like her dying from other causes over the next 10 years), if all she does his surgery and radiation. Anti-estrogens to include aromatase inhibitors of taken for 5-10 years would reduce that risk by 15% leaving her with a 31% risk of recurrence chemotherapy including anthracyclines and taxanes would reduce the risk and additional 5%, leaving her with a 26% risk of recurrence.  We discussed his numbers at length  and at this point she is motivated to receive the chemotherapy. She will come to "chemotherapy school", and she will also need a port placed. I am going  to see her again on April 26. I am hoping by then she will be a more functional. If she is we will start her chemotherapy on May 2.  Today I upped her OxyContin to 20 mg 3 times a day as needed for pain, and also wrote her for gabapentin 300 mg to take up to 4 times a day. She will let us know as she gets some relief from these medications.   MAGRINAT,GUSTAV C    10/30/2011

## 2011-10-31 ENCOUNTER — Telehealth: Payer: Self-pay | Admitting: *Deleted

## 2011-10-31 NOTE — Telephone Encounter (Signed)
patient confirmed over the phone the new date and times on 11-06-2011 chemo class 11-13-2011 2:00 with dr.wentworth 11-29-2011 to see dr.magrinat

## 2011-11-04 ENCOUNTER — Encounter (INDEPENDENT_AMBULATORY_CARE_PROVIDER_SITE_OTHER): Payer: Self-pay | Admitting: General Surgery

## 2011-11-04 ENCOUNTER — Ambulatory Visit (INDEPENDENT_AMBULATORY_CARE_PROVIDER_SITE_OTHER): Payer: Medicare Other | Admitting: General Surgery

## 2011-11-04 VITALS — BP 118/72 | HR 80 | Temp 98.4°F | Resp 20 | Ht 65.0 in | Wt 282.4 lb

## 2011-11-04 DIAGNOSIS — C50919 Malignant neoplasm of unspecified site of unspecified female breast: Secondary | ICD-10-CM

## 2011-11-05 ENCOUNTER — Telehealth: Payer: Self-pay | Admitting: Oncology

## 2011-11-05 NOTE — Telephone Encounter (Signed)
Left patient a message.  S/p surgery- chemo class scheduled tomorrow.

## 2011-11-06 ENCOUNTER — Other Ambulatory Visit: Payer: Medicare Other

## 2011-11-06 ENCOUNTER — Encounter: Payer: Self-pay | Admitting: *Deleted

## 2011-11-12 NOTE — Progress Notes (Signed)
HERE TODAY FOR LEFT BREAST INVASIVE DUCTAL CARCINOMA, GRADE 2  ER / PR POSITIVE  MARRIED  6 CHILDREN  15 GRANCHILDREN

## 2011-11-13 ENCOUNTER — Encounter (HOSPITAL_COMMUNITY): Payer: Self-pay | Admitting: Pharmacy Technician

## 2011-11-13 ENCOUNTER — Ambulatory Visit
Admission: RE | Admit: 2011-11-13 | Discharge: 2011-11-13 | Disposition: A | Discharge status: Home / Self Care | Payer: Medicare Other | Source: Ambulatory Visit | Attending: Radiation Oncology | Admitting: Radiation Oncology

## 2011-11-13 ENCOUNTER — Encounter: Payer: Self-pay | Admitting: *Deleted

## 2011-11-13 ENCOUNTER — Encounter: Payer: Self-pay | Admitting: Radiation Oncology

## 2011-11-13 VITALS — BP 137/74 | HR 89 | Temp 99.4°F | Resp 18 | Ht 65.0 in | Wt 275.4 lb

## 2011-11-13 DIAGNOSIS — G4730 Sleep apnea, unspecified: Secondary | ICD-10-CM | POA: Insufficient documentation

## 2011-11-13 DIAGNOSIS — N6320 Unspecified lump in the left breast, unspecified quadrant: Secondary | ICD-10-CM

## 2011-11-13 DIAGNOSIS — C50919 Malignant neoplasm of unspecified site of unspecified female breast: Secondary | ICD-10-CM | POA: Insufficient documentation

## 2011-11-13 DIAGNOSIS — J449 Chronic obstructive pulmonary disease, unspecified: Secondary | ICD-10-CM | POA: Insufficient documentation

## 2011-11-13 DIAGNOSIS — Z79899 Other long term (current) drug therapy: Secondary | ICD-10-CM | POA: Insufficient documentation

## 2011-11-13 DIAGNOSIS — J4489 Other specified chronic obstructive pulmonary disease: Secondary | ICD-10-CM | POA: Insufficient documentation

## 2011-11-13 NOTE — Progress Notes (Signed)
Radiation Oncology         [phone] ________________________________  Initial outpatient Consultation  Name: Jamie [name] MRN: [id number]  Date: 11/13/2011  DOB: 1946-04-17  WU:JWJX,BJYNWGN M., MD, MD  Magrinat, Valentino Hue, MD   REFERRING PHYSICIAN: Magrinat, Valentino Hue, MD  DIAGNOSIS: There were no encounter diagnoses.  HISTORY OF PRESENT ILLNESS::Jamie [name] is a 66 y.o. female who palpated a left breast mass in January of this year. She had a negative mammogram in July 2012. A biopsy was performed on 08/28/2011 which showed invasive ductal carcinoma grade 2 ER positive PR positive key 6730%. HER-2 negative. Ultrasound confirmed the presence of a 3 cm mass. Presentation to Dr. Carolynne Edouard she was noted to have palpable masses in the right breast measuring 1.3 and 1 cm respectively. Biopsy was performed which confirmed usual doctor hyperplasia and a papilloma she was seen by Dr. Carolynne Edouard and elected for bilateral mastectomies which she had performed on 10/18/2011. She had expanders placed at that time. Her left mastectomy showed a 3.9 cm grade 2 invasive ductal carcinoma with negative margins. One sentinel lymph node was positive for metastatic carcinoma. An additional 19 lymph nodes were negative. On the right no malignancy was noted. N. 0/17 lymph nodes were positive. Lymphovascular space invasion was noted on the left. The tumor was ER and PR positive with HER-2 was equivocal. Repeat HER-2 testing was negative. She saw Dr. Darnelle Catalan recommended chemotherapy given her positive lymph node. She is set up to have a Port-A-Cath placed next week. His been through chemotherapy education class. Her main complaints today are of a frozen left shoulder and significant pain. She still not able to sleep other than a recliner at night. She keeps her family awake and she does have to be obtained to position every 2-3 hours. She is on OxyContin and oxycodone. She was recently placed on Neurontin by Dr. Carolynne Edouard and that has  worked the best at controlling her pain. She has not been referred to physical therapy. Dr. Darnelle Catalan referred her to me for discussion regarding radiation in the management of her disease.  PREVIOUS RADIATION THERAPY: No  PAST MEDICAL HISTORY:  has a past medical history of Left breast mass; Asthma; COPD (chronic obstructive pulmonary disease); Shortness of breath; Sleep apnea; UTI (lower urinary tract infection); lt breast ca (dx'd 08/2011); and Headache.    PAST SURGICAL HISTORY: Past Surgical History  Procedure Date  . Cesarean section 04/1976  . Abdominal hysterectomy 1977  . Laparoscopic gastric banding 2010  . Nasal sinus surgery   . Tonsillectomy   . Appendectomy   . Mastectomy w/ sentinel node biopsy 10/18/2011    Procedure: MASTECTOMY WITH SENTINEL LYMPH NODE BIOPSY;  Surgeon: Robyne Askew, MD;  Location: [facility];  Service: General;  Laterality: Bilateral;  bilateral mastectomy and left sentinel node biopsy  . Axillary lymph node dissection 10/18/2011    Procedure: AXILLARY LYMPH NODE DISSECTION;  Surgeon: Robyne Askew, MD;  Location: [facility];  Service: General;  Laterality: Left;  . Breast reconstruction 10/18/2011    Procedure: BREAST RECONSTRUCTION;  Surgeon: Wayland Denis, DO;  Location: [facility];  Service: Plastics;  Laterality: Bilateral;   bilateral breast reconstruction with bilateral tissue expander and placement of flex hd.    FAMILY HISTORY: family history is negative for Anesthesia problems, and Hypotension, and Malignant hyperthermia, and Pseudochol deficiency, .  SOCIAL HISTORY:  reports that she quit smoking about 5 years ago. She has never used smokeless tobacco. She  reports that she drinks alcohol. She reports that she does not use illicit drugs.  ALLERGIES: Vicodin and Sulfa antibiotics  MEDICATIONS:  Current Outpatient Prescriptions  Medication Sig Dispense Refill  . albuterol (PROVENTIL HFA;VENTOLIN HFA) 108 (90 BASE) MCG/ACT inhaler Inhale 2 puffs into the  lungs every 6 (six) hours as needed. For wheezing      . aspirin EC 81 MG tablet Take 81 mg by mouth daily.      . ciprofloxacin (CIPRO) 500 MG tablet Take 500 mg by mouth 2 (two) times daily.      . diazepam (VALIUM) 10 MG tablet Take 5 mg by mouth every 6 (six) hours as needed.      . fluconazole (DIFLUCAN) 150 MG tablet       . furosemide (LASIX) 20 MG tablet Take 20 mg by mouth 2 (two) times daily.       Marland Kitchen gabapentin (NEURONTIN) 300 MG capsule Take 1 capsule (300 mg total) by mouth 3 (three) times daily.  60 capsule  3  . loratadine (CLARITIN) 10 MG tablet Take 10 mg by mouth daily.      Marland Kitchen oxyCODONE (OXYCONTIN) 10 MG 12 hr tablet Take 2 tablets (20 mg total) by mouth 3 (three) times daily as needed for pain.  60 tablet  0  . simvastatin (ZOCOR) 20 MG tablet Take 20 mg by mouth Daily.       Marland Kitchen tiotropium (SPIRIVA) 18 MCG inhalation capsule Place 18 mcg into inhaler and inhale daily.        REVIEW OF SYSTEMS:  A 15 point review of systems is documented in the electronic medical record. This was obtained by the nursing staff. However, I reviewed this with the patient to discuss relevant findings and make appropriate changes.  Pertinent items are noted in HPI. She also complaints of lower extremity edema, depression, anxiety, insomnia, shortness of breath.   PHYSICAL EXAM:  height is 5\' 5"  (1.651 m) and weight is 275 lb 6.4 oz (124.921 kg). Her oral temperature is 99.4 F (37.4 C). Her blood pressure is 137/74 and her pulse is 89. Her respiration is 18.   She is an obese female sitting comfortably in the examining room chair. She is she appears in pain. She is alert and oriented x3. She has bilateral mastectomy scars. She has minimal lymphedema of the left arm. She has decreased range of motion of the left arm.  LABORATORY DATA:  Lab Results  Component Value Date   WBC 7.5 10/14/2011   HGB 14.1 10/14/2011   HCT 43.6 10/14/2011   MCV 89.3 10/14/2011   PLT 185 10/14/2011   Lab Results  Component  Value Date   NA 141 10/14/2011   K 4.7 10/14/2011   CL 103 10/14/2011   CO2 28 10/14/2011   Lab Results  Component Value Date   ALT 39* 09/03/2011   AST 32 09/03/2011   ALKPHOS 69 09/03/2011   BILITOT 0.2* 09/03/2011     RADIOGRAPHY: Dg Chest 2 View  10/14/2011  *RADIOLOGY REPORT*  Clinical Data: Preop radiograph  CHEST - 2 VIEW  Comparison: 09/10/2011  Findings: Normal heart size.  No pleural effusion or pulmonary edema.  No airspace consolidation identified.  Decreased lung volumes and coarsened interstitial markings are noted.  There is spondylosis identified within the thoracic spine.  IMPRESSION:  1.  No acute cardiopulmonary abnormalities.  Original Report Authenticated By: Rosealee Albee, M.D.   Nm Sentinel Node Inj-no Rpt (breast)  10/18/2011  CLINICAL  DATA: left breast cancer   Sulfur colloid was injected intradermally by the nuclear medicine  technologist for breast cancer sentinel node localization.        IMPRESSION: T2 N1 invasive ductal carcinoma of the left breast status post mastectomy  PLAN: I met with patient and her husband today regarding radiation in the management of patients with breast cancer after mastectomy. We discussed the randomized trials showing a survival benefit in patients who receive radiation after a mastectomy and have positive lymph nodes. We discussed the NCCN guidelines which indicate that patient should be offered radiation with one positive lymph node. We discussed the retrospective studies that show the benefit for patients with one positive lymph node and ER positive disease is minimal. I believe she has approximately a 10-15% chance of chest wall recurrence. Radiation can decrease this by half. We discussed this would be at a cost of increased her complications possibly with her expander on the left. We discussed the process of simulation in the placement of tattoos. We discussed daily treatments for 6 weeks. We discussed the possible side effects of  treatment including but not limited to fatigue and skin redness.  At this point I think Rei seemed very overwhelmed by her diagnosis and the change in plan. She clearly was expecting to just have mastectomies and no further treatment. I told her she should continue with her expansions under the care of Dr. Kelly Splinter while she is receiving chemotherapy. I discussed with her that other institutions it is quite standard for them to place expanders at the time of surgery and treat with radiation. We also discussed referral to physical therapy which I will contact Dr. Carolynne Edouard about. She can benefit from evaluation. She discussed getting home health involved in her care as well as a possible hospital bed. I have asked her to discuss that with Dr. Carolynne Edouard or Dr. Darnelle Catalan. My plan would be to see her back after her chemotherapy is complete. At that time we can discuss radiation in more detail. I spent 60 minutes minutes face to face with the patient and more than 50% of that time was spent in counseling and/or coordination of care.  -----------------------------------------------

## 2011-11-13 NOTE — Progress Notes (Signed)
[facility] Psychosocial Distress Screening Clinical Social Work  Clinical Social Work was referred by distress screening protocol.  The patient scored a 8 on the Psychosocial Distress Thermometer which indicates severe distress. Clinical Social Worker met with patient and spouse after radiation oncology appointment to assess for distress and other psychosocial needs. Patient states she is in a lot of pain and talked briefly with CSW. Ms. Dezeeuw states she has been unable to sleep recently due to sleeping in a chair. The patient plans to discuss this with Dr. Darnelle Catalan to determine if she should request a hospital bed in her home. Patient wished to not speak any further at this time.   Clinical Social Worker follow up needed: no  If yes, follow up plan:  Kathrin Penner, MSW, [facility] Clinical Social Worker [facility] [phone]

## 2011-11-19 ENCOUNTER — Telehealth: Payer: Self-pay | Admitting: *Deleted

## 2011-11-19 NOTE — Telephone Encounter (Signed)
Spoke to pt concerning new treatment care plan of port placement and adjuvant chemotherapy.  Confirmed appts for port placement and f/u appt with Dr. Darnelle Catalan.  When questioned, pt denies needs or concerns at this time.  Pt relate "I'm just going to go with the flow and stop worrying about it".  Pt denies any further questions after attending chemo class.  Informed pt Dr. Darnelle Catalan will discuss in great detail the chemotherapy she will receive, side effects and anti-nausea medications.  Encourage pt to call with any needs or questions.  Received verbal understanding.  Contact information given.

## 2011-11-20 ENCOUNTER — Telehealth (INDEPENDENT_AMBULATORY_CARE_PROVIDER_SITE_OTHER): Payer: Self-pay | Admitting: General Surgery

## 2011-11-20 NOTE — Telephone Encounter (Signed)
Jamie [name], pre-admission at [facility], calling for Dr. Carolynne Edouard to enter orders for this pt.  Has appt tomorrow.  Thanks.

## 2011-11-20 NOTE — Pre-Procedure Instructions (Addendum)
20 Jamie [name]  11/20/2011   Your procedure is scheduled on:  April 24  Report to [facility] at 0630 AM.  Call this number if you have problems the morning of surgery: [phone]   Remember:   Do not eat food:After Midnight.  May have clear liquids: up to 4 Hours before arrival.  Clear liquids include soda, tea, black coffee, apple or grape juice, broth.  Take these medicines the morning of surgery with A SIP OF WATER: Albuterol - bring to surgery, Cipro, Claritin, Spiriva, Gabapentin   Do not wear jewelry, make-up or nail polish.  Do not wear lotions, powders, or perfumes. You may wear deodorant.  Do not shave 48 hours prior to surgery.  Do not bring valuables to the hospital.  Contacts, dentures or bridgework may not be worn into surgery.  Leave suitcase in the car. After surgery it may be brought to your room.  For patients admitted to the hospital, checkout time is 11:00 AM the day of discharge.   Patients discharged the day of surgery will not be allowed to drive home.  Name and phone number of your driver: Davina Poke [phone]  Special Instructions: CHG Shower Use Special Wash: 1/2 bottle night before surgery and 1/2 bottle morning of surgery.   Please read over the following fact sheets that you were given: Pain Booklet, Coughing and Deep Breathing and Surgical Site Infection Prevention

## 2011-11-21 ENCOUNTER — Other Ambulatory Visit (INDEPENDENT_AMBULATORY_CARE_PROVIDER_SITE_OTHER): Payer: Self-pay | Admitting: General Surgery

## 2011-11-21 ENCOUNTER — Encounter (HOSPITAL_COMMUNITY)
Admission: RE | Admit: 2011-11-21 | Discharge: 2011-11-21 | Disposition: A | Discharge status: Home / Self Care | Payer: Medicare Other | Source: Ambulatory Visit | Attending: General Surgery | Admitting: General Surgery

## 2011-11-21 ENCOUNTER — Encounter (HOSPITAL_COMMUNITY): Payer: Self-pay

## 2011-11-21 LAB — CBC
Hemoglobin: 12.9 g/dL (ref 12.0–15.0)
MCH: 28.7 pg (ref 26.0–34.0)
Platelets: 227 10*3/uL (ref 150–400)
RBC: 4.5 MIL/uL (ref 3.87–5.11)
WBC: 6 10*3/uL (ref 4.0–10.5)

## 2011-11-21 LAB — SURGICAL PCR SCREEN
MRSA, PCR: NEGATIVE
Staphylococcus aureus: NEGATIVE

## 2011-11-21 NOTE — Progress Notes (Addendum)
Requested last OV notes from [facility], PCP Dr. Carolyne Fiscal. They faxed EKG done by [facility] in March 2013, requested older EKG if available.  Chart left for anesthesia review of EKG.

## 2011-11-22 NOTE — Consult Note (Signed)
Anesthesia Chart Review:  Please refer to my consult note from 10/18/11.  She is now s/p bilateral mastectomies for T2 N1 invasive ductal carcinoma of the left breast. She is now for a Port-a-Cath insertion on 11/27/11.    Only a CBC was done at her PAT appointment.  Meds list includes furosemide.  She will need a BMET on arrival.

## 2011-11-25 ENCOUNTER — Encounter: Payer: Self-pay | Admitting: Oncology

## 2011-11-25 NOTE — Progress Notes (Signed)
Subjective:     Patient ID: Jamie [name], female   DOB: 05/21/1946, 66 y.o.   MRN: [id number]  HPI The patient is a 66-year-old white female who is about 3 weeks out from bilateral mastectomies and left axillary lymph node dissection for a left-sided T2 N1 A. breast cancer. She was ER and PR positive and HER-2/neu negative. Her main complaint is of pain in her shoulder and chest wall. She has had difficulty moving her arm secondary to the pain. She actually saw Dr. Magrinat after surgery and he started her on Neurontin which has had some positive effect on controlling her pain. At the time of her surgery she also had tissue expanders placed by Dr. Sanger. Dr. Sanger has been managing her drains.  Review of Systems     Objective:   Physical Exam On exam her mastectomy incisions look okay. She has a very small amount of necrotic skin right at the staple line. The skin flaps themselves otherwise looked viable. There is no sign of infection.    Assessment:     3 weeks status post bilateral mastectomies and a left axillary lymph node dissection with immediate placement of tissue expander    Plan:     At this point I would agree with starting her on Neurontin for her pain. She seems to be making gradual improvement. Her drains will continue to be managed by Dr. Sanger. Dr. Magrinat has indicated that she will need a Port-A-Cath. I discussed with her the risks and benefits of placing a Port-A-Cath as well as some of the technical aspects and she understands and wishes to proceed. We will plan to see her back in the next 3 or 4 weeks to check her progress      

## 2011-11-29 ENCOUNTER — Ambulatory Visit (HOSPITAL_BASED_OUTPATIENT_CLINIC_OR_DEPARTMENT_OTHER): Payer: Medicare Other | Admitting: Oncology

## 2011-11-29 ENCOUNTER — Telehealth: Payer: Self-pay | Admitting: Oncology

## 2011-11-29 VITALS — BP 153/81 | HR 91 | Temp 97.9°F | Ht 65.0 in | Wt 273.7 lb

## 2011-11-29 DIAGNOSIS — R5381 Other malaise: Secondary | ICD-10-CM

## 2011-11-29 DIAGNOSIS — C50219 Malignant neoplasm of upper-inner quadrant of unspecified female breast: Secondary | ICD-10-CM

## 2011-11-29 DIAGNOSIS — Z170 Estrogen receptor positive status [ER+]: Secondary | ICD-10-CM

## 2011-11-29 DIAGNOSIS — C50919 Malignant neoplasm of unspecified site of unspecified female breast: Secondary | ICD-10-CM

## 2011-11-29 DIAGNOSIS — C50419 Malignant neoplasm of upper-outer quadrant of unspecified female breast: Secondary | ICD-10-CM

## 2011-11-29 MED ORDER — PROCHLORPERAZINE MALEATE 10 MG PO TABS: 10.0000 mg | ORAL_TABLET | Freq: Four times a day (QID) | ORAL | Status: DC

## 2011-11-29 MED ORDER — LIDOCAINE-PRILOCAINE 2.5-2.5 % EX CREA: TOPICAL_CREAM | Freq: Once | CUTANEOUS | Status: DC

## 2011-11-29 MED ORDER — OXYCODONE HCL 5 MG PO TABS: 5.0000 mg | ORAL_TABLET | Freq: Every evening | ORAL | Status: AC | PRN

## 2011-11-29 MED ORDER — DEXAMETHASONE 4 MG PO TABS: 8.0000 mg | ORAL_TABLET | Freq: Two times a day (BID) | ORAL | Status: DC

## 2011-11-29 MED ORDER — LORAZEPAM 0.5 MG PO TABS: 0.5000 mg | ORAL_TABLET | Freq: Every evening | ORAL | Status: DC | PRN

## 2011-11-29 NOTE — Progress Notes (Signed)
ID: Jamie [name]   DOB: 23-Jul-1946  MR#: [id number]  WUJ#:[id number]  HISTORY OF PRESENT ILLNESS: The patient noted a mass in her left breast and brought it to the attention of her primary physician, Dr. Joetta Manners, who set her up for diagnostic mammography. This showed a mass of about 3 cm in diameter (I do not have a copy of that study or comparison with prior mammography from July of 2012). The patient was referred to Dr. Carolynne Edouard and he set her up for biopsy of the [facility]. The pathology showed [id number]) an invasive ductal carcinoma, grade 2, estrogen receptor 100% positive, progesterone receptor 96% positive, with an MIB-1 of 30%, and HER-2 equivocal with the ratio by C[facility] of 1.81.  Diagnostic right mammography was then performed 08/28/2011. Multiple masses were noted, and there were multiple soft tissue nodules by palpation. Ultrasound specifically showed 2 hypoechoic round masses measuring 1.3 and 0.9 cm respectively. 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 With this information and after extensive discussion the patient opted for bilateral mastectomies with left sentinel lymph node sampling, and this was performed 10/18/2011, with results and further treatment as discussed below.  INTERVAL HISTORY: The patient returns today with her husband, [name], to make definitive treatment plans. Since her last visit here she met with Dr. Michell Heinrich to discuss likely postmastectomy radiation following her chemotherapy. She also came to chemotherapy school. She continues to meet with Dr. Kelly Splinter with regular injections into her expanders. She is scheduled for port placement on April 29. Overall she she is getting back to baseline and says "I am ready".  REVIEW OF SYSTEMS: She is not exercising at all, and blames her COPD for that. She likes swimming, and her daughter does have a pool the patient uses in the summer. She  describes herself as severely fatigued. She is short of breath when walking up stairs. She has some urinary leakage. Her pain is now better controlled on the combination of gabapentin and oxycodone. This does not confuse or constipate her. A detailed review of systems was otherwise stable.  PAST MEDICAL HISTORY: Past Medical History  Diagnosis Date  . Left breast mass   . Asthma   . COPD (chronic obstructive pulmonary disease)   . Shortness of breath   . Sleep apnea     sleep study2-3 yrs ago lost weight afterward but gained back  . UTI (lower urinary tract infection)     freq-bladder implant -removed  . lt breast ca dx'd 08/2011  . Headache     hx migraines  hx tobacco abuse, 80 P/Y, resolved  PAST SURGICAL HISTORY: Past Surgical History  Procedure Date  . Cesarean section 04/1976  . Abdominal hysterectomy 1977  . Laparoscopic gastric banding 2010  . Nasal sinus surgery   . Tonsillectomy   . Appendectomy   . Mastectomy w/ sentinel node biopsy 10/18/2011    Procedure: MASTECTOMY WITH SENTINEL LYMPH NODE BIOPSY;  Surgeon: Robyne Askew, MD;  Location: [facility] OR;  Service: General;  Laterality: Bilateral;  bilateral mastectomy and left sentinel node biopsy  . Axillary lymph node dissection 10/18/2011    Procedure: AXILLARY LYMPH NODE DISSECTION;  Surgeon: Robyne Askew, MD;  Location: [facility] OR;  Service: General;  Laterality: Left;  . Breast reconstruction 10/18/2011    Procedure: BREAST RECONSTRUCTION;  Surgeon: Wayland Denis, DO;  Location: [facility] OR;  Service: Plastics;  Laterality: Bilateral;   bilateral  breast reconstruction with bilateral tissue expander and placement of flex hd.  . US echocardiography     at [facility]  no salpingo-oophorectomy  FAMILY HISTORY Family History  Problem Relation Age of Onset  . Anesthesia problems Neg Hx   . Hypotension Neg Hx   . Malignant hyperthermia Neg Hx   . Pseudochol deficiency Neg Hx   The patient's father died at the age of 74. He had a  history of asbestos exposure. The patient's mother died at the age of 68 from heart disease. The patient had one brother, no sisters. There is no breast or ovarian cancer in the immediate family but 2 of the patient's 5 maternal aunts had breast cancer (she does not know the age of diagnosis), and one of her father's sisters had ovarian cancer.  GYNECOLOGIC HISTORY: She had menarche age 32, no firm date for her menopause as she had hysterectomy remotely. She is GX P2, with a pair of twins and a daughter as her biological children as noted below. She never took hormone replacement.  SOCIAL HISTORY: She owns a tuxedo shop. Her husband of 34 years, [name], is retired but used to own a Designer, television/film set. He is currently working in IT with one of his own sons. At home in addition to the patient and her husband is one of the patient's daughters and her 2 children, as well as one of Forrest sons. Altogether they have 6 children and 15 grandchildren. The patient attends [employer]    ADVANCED DIRECTIVES:  HEALTH MAINTENANCE: History  Substance Use Topics  . Smoking status: Former Smoker -- 2.0 packs/day    Quit date: 08/25/2006  . Smokeless tobacco: Never Used   Comment: social drinker 2-3 yr  . Alcohol Use: Yes     rarely     Colonoscopy: 2012  PAP: s/p hysterectomy  Bone density:  Lipid panel:  Allergies  Allergen Reactions  . Vicodin (Hydrocodone-Acetaminophen) Other (See Comments)    syncope  . Sulfa Antibiotics Other (See Comments)    unknown    Current Outpatient Prescriptions  Medication Sig Dispense Refill  . albuterol (PROVENTIL HFA;VENTOLIN HFA) 108 (90 BASE) MCG/ACT inhaler Inhale 2 puffs into the lungs every 6 (six) hours as needed. For wheezing      . ciprofloxacin (CIPRO) 500 MG tablet Take 500 mg by mouth 2 (two) times daily.      . diazepam (VALIUM) 5 MG tablet Take 5 mg by mouth at bedtime as needed. For sleep.      . furosemide (LASIX) 20 MG tablet Take 20 mg by  mouth 2 (two) times daily.       Marland Kitchen gabapentin (NEURONTIN) 300 MG capsule Take 1 capsule (300 mg total) by mouth 3 (three) times daily.  60 capsule  3  . loratadine (CLARITIN) 10 MG tablet Take 10 mg by mouth daily.      Marland Kitchen oxyCODONE (OXYCONTIN) 20 MG 12 hr tablet Take 20 mg by mouth every 12 (twelve) hours. Takes once a day      . simvastatin (ZOCOR) 20 MG tablet Take 20 mg by mouth Daily.       Marland Kitchen tiotropium (SPIRIVA) 18 MCG inhalation capsule Place 18 mcg into inhaler and inhale daily.        OBJECTIVE: Middle-aged white woman in no acute distress Filed Vitals:   11/29/11 0917  BP: 153/81  Pulse: 91  Temp: 97.9 F (36.6 C)     Body mass index is 45.55 kg/(m^2).  ECOG FS: 2  Sclerae unicteric Oropharynx clear No peripheral adenopathy Lungs no rales or rhonchi Heart regular rate and rhythm Abd benign MSK Limited range of motion of the left shoulder, 2 about 100 abduction; no peripheral edema Neuro: nonfocal Breasts: Status post bilateral mastectomies with expanders is in place. There is a small eschar on the right. There is no dehiscence or evidence of inflammation or infection.  LAB RESULTS: Lab Results  Component Value Date   WBC 6.0 11/21/2011   NEUTROABS 3.0 09/03/2011   HGB 12.9 11/21/2011   HCT 39.7 11/21/2011   MCV 88.2 11/21/2011   PLT 227 11/21/2011      Chemistry      Component Value Date/Time   NA 141 10/14/2011 1540   K 4.7 10/14/2011 1540   CL 103 10/14/2011 1540   CO2 28 10/14/2011 1540   BUN 13 10/14/2011 1540   CREATININE 0.80 10/14/2011 1540      Component Value Date/Time   CALCIUM 9.7 10/14/2011 1540   ALKPHOS 69 09/03/2011 1549   AST 32 09/03/2011 1549   ALT 39* 09/03/2011 1549   BILITOT 0.2* 09/03/2011 1549       Lab Results  Component Value Date   LABCA2 27 09/03/2011    No components found with this basename: [id number]    No results found for this basename: INR:1;PROTIME:1 in the last 168 hours  Urinalysis No results found for this basename:  colorurine,  appearanceur,  labspec,  phurine,  glucoseu,  hgbur,  bilirubinur,  ketonesur,  proteinur,  urobilinogen,  nitrite,  leukocytesur    STUDIES: Dg Chest 2 View  10/14/2011  *RADIOLOGY REPORT*  Clinical Data: Preop radiograph  CHEST - 2 VIEW  Comparison: 09/10/2011  Findings: Normal heart size.  No pleural effusion or pulmonary edema.  No airspace consolidation identified.  Decreased lung volumes and coarsened interstitial markings are noted.  There is spondylosis identified within the thoracic spine.  IMPRESSION:  1.  No acute cardiopulmonary abnormalities.  Original Report Authenticated By: Rosealee Albee, M.D.   ASSESSMENT: 66 year-old [name] woman s/p bilateral mastectomies and left axillary lymph node dissection 10/18/2011 for a left-sided pT2 pN1, stage IIB invasive ductal carcinoma, grade 2, estrogen receptor 100% and progesterone receptor 96% positive, with no HER-2 amplification (1.81, repeat 1.35), Mib-1 of 30%. The right breast showed no malignancy.  PLAN: We spent the better part of her hour-long visit today discussing her Chemotherapy. Ideally she will receive cyclophosphamide and doxorubicin in dose dense fashion x4 followed by Taxol in dose dense fashion x4. I have written for the doxorubicin to be given by continuous infusion over 72 hours to minimize the risk of heart damage in this high risk patient. She will receive Neulasta at the time of pump removal. I am dosing at full body weight, but we will change to ideal body weight is cytopenias are excessive. We will essentially see her on a weekly basis for the next several months until her treatments are completed.  She has an important vacation time planned for the end of July. It may be that we will do a week's delay around that time, but we will deal with that issue as we get closer to it. After the first chemotherapy we will refer her to rehabilitation to start working on the mobility particularly of the left upper  extremity. She knows to call for any problems that may develop before the next visit  Carlin Mamone C    11/29/2011

## 2011-11-29 NOTE — Progress Notes (Signed)
Notified pt. of time change, stated to be at Short Stay at 0530 on 4/2//13.

## 2011-11-29 NOTE — Telephone Encounter (Signed)
gve the pt her may,june 2013 appt calendar

## 2011-12-01 MED ORDER — CEFAZOLIN SODIUM-DEXTROSE 2-3 GM-% IV SOLR
2.0000 g | INTRAVENOUS | Status: AC
  Administered 2011-12-02: 1 g via INTRAVENOUS
  Filled 2011-12-01: qty 50

## 2011-12-01 MED ORDER — CHLORHEXIDINE GLUCONATE 4 % EX LIQD: 1.0000 "application " | Freq: Once | CUTANEOUS | Status: DC

## 2011-12-02 ENCOUNTER — Ambulatory Visit (HOSPITAL_COMMUNITY): Payer: Medicare Other

## 2011-12-02 ENCOUNTER — Ambulatory Visit (HOSPITAL_COMMUNITY): Payer: Medicare Other | Admitting: Vascular Surgery

## 2011-12-02 ENCOUNTER — Ambulatory Visit (HOSPITAL_COMMUNITY)
Admission: RE | Admit: 2011-12-02 | Discharge: 2011-12-02 | Disposition: A | Discharge status: Home / Self Care | Payer: Medicare Other | Source: Ambulatory Visit | Attending: General Surgery | Admitting: General Surgery

## 2011-12-02 ENCOUNTER — Encounter (HOSPITAL_COMMUNITY): Payer: Self-pay | Admitting: Vascular Surgery

## 2011-12-02 ENCOUNTER — Encounter (HOSPITAL_COMMUNITY)
Admission: RE | Disposition: A | Discharge status: Home / Self Care | Payer: Self-pay | Source: Ambulatory Visit | Attending: General Surgery

## 2011-12-02 ENCOUNTER — Encounter (HOSPITAL_COMMUNITY): Payer: Self-pay | Admitting: *Deleted

## 2011-12-02 DIAGNOSIS — C50919 Malignant neoplasm of unspecified site of unspecified female breast: Secondary | ICD-10-CM | POA: Insufficient documentation

## 2011-12-02 DIAGNOSIS — Z452 Encounter for adjustment and management of vascular access device: Secondary | ICD-10-CM | POA: Insufficient documentation

## 2011-12-02 DIAGNOSIS — Z9010 Acquired absence of unspecified breast and nipple: Secondary | ICD-10-CM | POA: Insufficient documentation

## 2011-12-02 DIAGNOSIS — Z01812 Encounter for preprocedural laboratory examination: Secondary | ICD-10-CM | POA: Insufficient documentation

## 2011-12-02 HISTORY — PX: PORTACATH PLACEMENT: SHX2246

## 2011-12-02 LAB — BASIC METABOLIC PANEL
CO2: 27 mEq/L (ref 19–32)
Calcium: 9.3 mg/dL (ref 8.4–10.5)
Chloride: 105 mEq/L (ref 96–112)
Creatinine, Ser: 0.71 mg/dL (ref 0.50–1.10)
Glucose, Bld: 149 mg/dL — ABNORMAL HIGH (ref 70–99)

## 2011-12-02 SURGERY — INSERTION, TUNNELED CENTRAL VENOUS DEVICE, WITH PORT
Anesthesia: Monitor Anesthesia Care | Site: Chest | Laterality: Right | Wound class: Clean

## 2011-12-02 MED ORDER — ONDANSETRON HCL 4 MG/2ML IJ SOLN
INTRAMUSCULAR | Status: DC | PRN
  Administered 2011-12-02: 4 mg via INTRAVENOUS

## 2011-12-02 MED ORDER — HYDROMORPHONE HCL PF 1 MG/ML IJ SOLN: 0.2500 mg | INTRAMUSCULAR | Status: DC | PRN

## 2011-12-02 MED ORDER — OXYCODONE-ACETAMINOPHEN 5-325 MG PO TABS: 1.0000 | ORAL_TABLET | ORAL | Status: AC | PRN

## 2011-12-02 MED ORDER — SODIUM CHLORIDE 0.9 % IR SOLN
Status: DC | PRN
  Administered 2011-12-02: 08:00:00

## 2011-12-02 MED ORDER — 0.9 % SODIUM CHLORIDE (POUR BTL) OPTIME
TOPICAL | Status: DC | PRN
  Administered 2011-12-02: 1000 mL

## 2011-12-02 MED ORDER — FENTANYL CITRATE 0.05 MG/ML IJ SOLN
INTRAMUSCULAR | Status: DC | PRN
  Administered 2011-12-02: 50 ug via INTRAVENOUS
  Administered 2011-12-02: 100 ug via INTRAVENOUS
  Administered 2011-12-02: 50 ug via INTRAVENOUS

## 2011-12-02 MED ORDER — LACTATED RINGERS IV SOLN
INTRAVENOUS | Status: DC | PRN
  Administered 2011-12-02: 07:00:00 via INTRAVENOUS

## 2011-12-02 MED ORDER — LIDOCAINE HCL (CARDIAC) 20 MG/ML IV SOLN
INTRAVENOUS | Status: DC | PRN
  Administered 2011-12-02: 50 mg via INTRAVENOUS

## 2011-12-02 MED ORDER — HEPARIN SOD (PORK) LOCK FLUSH 100 UNIT/ML IV SOLN
INTRAVENOUS | Status: DC | PRN
  Administered 2011-12-02: 300 [IU] via INTRAVENOUS

## 2011-12-02 MED ORDER — BUPIVACAINE HCL (PF) 0.25 % IJ SOLN
INTRAMUSCULAR | Status: DC | PRN
  Administered 2011-12-02: 5 mL

## 2011-12-02 MED ORDER — ONDANSETRON HCL 4 MG/2ML IJ SOLN: 4.0000 mg | Freq: Once | INTRAMUSCULAR | Status: DC | PRN

## 2011-12-02 MED ORDER — LIDOCAINE HCL (PF) 1 % IJ SOLN
INTRAMUSCULAR | Status: DC | PRN
  Administered 2011-12-02: 5 mL

## 2011-12-02 MED ORDER — PROPOFOL 10 MG/ML IV EMUL
INTRAVENOUS | Status: DC | PRN
  Administered 2011-12-02: 170 mg via INTRAVENOUS

## 2011-12-02 SURGICAL SUPPLY — 53 items
ADH SKN CLS APL DERMABOND .7 (GAUZE/BANDAGES/DRESSINGS) ×1
BAG DECANTER FOR FLEXI CONT (MISCELLANEOUS) ×2 IMPLANT
BLADE SURG 15 STRL LF DISP TIS (BLADE) ×1 IMPLANT
BLADE SURG 15 STRL SS (BLADE) ×2
CHLORAPREP W/TINT 10.5 ML (MISCELLANEOUS) ×2 IMPLANT
CLOTH BEACON ORANGE TIMEOUT ST (SAFETY) ×2 IMPLANT
COVER SURGICAL LIGHT HANDLE (MISCELLANEOUS) ×2 IMPLANT
CRADLE DONUT ADULT HEAD (MISCELLANEOUS) ×2 IMPLANT
DECANTER SPIKE VIAL GLASS SM (MISCELLANEOUS) ×4 IMPLANT
DERMABOND ADVANCED (GAUZE/BANDAGES/DRESSINGS) ×1
DERMABOND ADVANCED .7 DNX12 (GAUZE/BANDAGES/DRESSINGS) ×1 IMPLANT
DRAPE C-ARM 42X72 X-RAY (DRAPES) ×2 IMPLANT
DRAPE CHEST BREAST 15X10 FENES (DRAPES) ×2 IMPLANT
DRAPE UTILITY 15X26 W/TAPE STR (DRAPE) ×4 IMPLANT
ELECT CAUTERY BLADE 6.4 (BLADE) ×2 IMPLANT
ELECT REM PT RETURN 9FT ADLT (ELECTROSURGICAL) ×2
ELECTRODE REM PT RTRN 9FT ADLT (ELECTROSURGICAL) ×1 IMPLANT
GAUZE SPONGE 4X4 16PLY XRAY LF (GAUZE/BANDAGES/DRESSINGS) ×4 IMPLANT
GLOVE BIO SURGEON STRL SZ 6.5 (GLOVE) ×4 IMPLANT
GLOVE BIO SURGEON STRL SZ7.5 (GLOVE) ×2 IMPLANT
GLOVE BIOGEL PI IND STRL 7.0 (GLOVE) ×1 IMPLANT
GLOVE BIOGEL PI INDICATOR 7.0 (GLOVE) ×1
GOWN STRL NON-REIN LRG LVL3 (GOWN DISPOSABLE) ×4 IMPLANT
INTRODUCER COOK 11FR (CATHETERS) IMPLANT
KIT BASIN OR (CUSTOM PROCEDURE TRAY) ×2 IMPLANT
KIT PORT POWER 9.6FR MRI PREA (Catheter) IMPLANT
KIT PORT POWER ISP 8FR (Catheter) ×2 IMPLANT
KIT POWER CATH 8FR (Catheter) IMPLANT
KIT ROOM TURNOVER OR (KITS) ×2 IMPLANT
NEEDLE HYPO 25GX1X1/2 BEV (NEEDLE) ×2 IMPLANT
NS IRRIG 1000ML POUR BTL (IV SOLUTION) ×2 IMPLANT
PACK SURGICAL SETUP 50X90 (CUSTOM PROCEDURE TRAY) ×2 IMPLANT
PAD ARMBOARD 7.5X6 YLW CONV (MISCELLANEOUS) ×4 IMPLANT
PENCIL BUTTON HOLSTER BLD 10FT (ELECTRODE) ×2 IMPLANT
PORT POWER 8FR SL MRI (Set) ×2 IMPLANT
SET INTRODUCER 12FR PACEMAKER (SHEATH) IMPLANT
SET SHEATH INTRODUCER 10FR (MISCELLANEOUS) IMPLANT
SHEATH COOK PEEL AWAY SET 9F (SHEATH) IMPLANT
SPONGE GAUZE 4X4 12PLY (GAUZE/BANDAGES/DRESSINGS) ×2 IMPLANT
SUT MNCRL AB 4-0 PS2 18 (SUTURE) ×2 IMPLANT
SUT PROLENE 2 0 SH 30 (SUTURE) ×4 IMPLANT
SUT SILK 2 0 (SUTURE)
SUT SILK 2-0 18XBRD TIE 12 (SUTURE) IMPLANT
SUT SILK 3 0 (SUTURE) ×1
SUT SILK 3-0 18XBRD TIE 12 (SUTURE) ×1 IMPLANT
SUT VIC AB 3-0 SH 27 (SUTURE) ×2
SUT VIC AB 3-0 SH 27XBRD (SUTURE) ×1 IMPLANT
SYR 20ML ECCENTRIC (SYRINGE) ×4 IMPLANT
SYR 5ML LUER SLIP (SYRINGE) ×4 IMPLANT
SYR CONTROL 10ML LL (SYRINGE) ×2 IMPLANT
TOWEL OR 17X24 6PK STRL BLUE (TOWEL DISPOSABLE) ×2 IMPLANT
TOWEL OR 17X26 10 PK STRL BLUE (TOWEL DISPOSABLE) ×2 IMPLANT
WATER STERILE IRR 1000ML POUR (IV SOLUTION) IMPLANT

## 2011-12-02 NOTE — H&P (View-Only) (Signed)
Subjective:     Patient ID: Jamie [name], female   DOB: Mar 05, 1946, 66 y.o.   MRN: [id number]  HPI The patient is a 66 year old white female who is about 3 weeks out from bilateral mastectomies and left axillary lymph node dissection for a left-sided T2 N1 A. breast cancer. She was ER and PR positive and HER-2/neu negative. Her main complaint is of pain in her shoulder and chest wall. She has had difficulty moving her arm secondary to the pain. She actually saw Dr. Darnelle Catalan after surgery and he started her on Neurontin which has had some positive effect on controlling her pain. At the time of her surgery she also had tissue expanders placed by Dr. Kelly Splinter. Dr. Kelly Splinter has been managing her drains.  Review of Systems     Objective:   Physical Exam On exam her mastectomy incisions look okay. She has a very small amount of necrotic skin right at the staple line. The skin flaps themselves otherwise looked viable. There is no sign of infection.    Assessment:     3 weeks status post bilateral mastectomies and a left axillary lymph node dissection with immediate placement of tissue expander    Plan:     At this point I would agree with starting her on Neurontin for her pain. She seems to be making gradual improvement. Her drains will continue to be managed by Dr. Kelly Splinter. Dr. Darnelle Catalan has indicated that she will need a Port-A-Cath. I discussed with her the risks and benefits of placing a Port-A-Cath as well as some of the technical aspects and she understands and wishes to proceed. We will plan to see her back in the next 3 or 4 weeks to check her progress

## 2011-12-02 NOTE — Transfer of Care (Signed)
Immediate Anesthesia Transfer of Care Note  Patient: Jamie [name]  Procedure(s) Performed: Procedure(s) (LRB): INSERTION PORT-A-CATH (Right)  Patient Location: PACU  Anesthesia Type: General  Level of Consciousness: sedated  Airway & Oxygen Therapy: Patient Spontanous Breathing and Patient connected to nasal cannula oxygen  Post-op Assessment: Report given to PACU RN and Post -op Vital signs reviewed and stable  Post vital signs: Reviewed and stable  Complications: No apparent anesthesia complications

## 2011-12-02 NOTE — Op Note (Signed)
12/02/2011  8:46 AM  PATIENT:  Jamie [name]  66 y.o. female  PRE-OPERATIVE DIAGNOSIS:  breast cancer  POST-OPERATIVE DIAGNOSIS:  breast cancer  PROCEDURE:  Procedure(s) (LRB): INSERTION PORT-A-CATH (Right)  SURGEON:  Surgeon(s) and Role:    * Robyne Askew, MD - Primary  PHYSICIAN ASSISTANT:   ASSISTANTS: none   ANESTHESIA:   general  EBL:  Total I/O In: 600 [I.V.:600] Out: -   BLOOD ADMINISTERED:none  DRAINS: none   LOCAL MEDICATIONS USED:  MARCAINE     SPECIMEN:  No Specimen  DISPOSITION OF SPECIMEN:  N/A  COUNTS:  YES  TOURNIQUET:  * No tourniquets in log *  DICTATION: .Dragon Dictation After informed consent was obtained the patient was brought to the operating room and placed in the supine position on the operating room table. After adequate induction of general anesthesia a roll was placed between the patient's shoulder blades to extend the shoulder slightly. The right chest wall and neck were then prepped with ChloraPrep, allowed to dry, and draped in usual sterile manner. The patient was placed in Trendelenburg position. Initially a small stab incision was made just lateral to the bend of the clavicle and the right chest. Attempts were made to identify the right subclavian vein beneath the clavicle but all I could feel bone. We then decided to move to the right internal jugular vein.  We identified this easily using initially a 22-gauge needle and 10 cc syringe. We then accessed the vein without difficulty using the large bore finder needle from the Port-A-Cath kit. The wire was fed through the needle using the Seldinger technique without difficulty. The wire was confirmed in the central venous system using real-time fluoroscopy. Next the incision on the right chest wall was extended medially and laterally a short distance with the 15 blade knife and electrocautery. A subcutaneous pocket was created by combination of blunt finger dissection and some sharp dissection  with the electrocautery inferior to the incision.we then created a subcutaneous tract between the chest wall incision and the wire entry site in the neck. The tubing was then brought through the tract. The tubing was placed on the reservoir and the reservoir was placed in the subcutaneous pocket. The length of the tubing was then estimated using real-time fluoroscopy. The tubing was cut to length. Next a sheath and dilator were placed over the wire also using the Seldinger technique without difficulty. The wire and dilator were removed. The tubing was fed through the sheath as far as it could be fed and then held in place while the sheath was gently cracked and separated. Once this was accomplished we checked the position of the tubing using real-time fluoroscopy. The tubing appeared to be in the superior vena cava. Next the anchor was used to permanently attach the tubing to the reservoir. The reservoir was anchored in the subcutaneous pocket with 2 2-0 Prolene stitches. The port was then aspirated and it aspirated blood easily. It was flushed initially with a dilute heparin solution then with a more concentrated heparin solution. The incision on the neck was closed with an interrupted 4 Monocryl subcuticular stitch. In making the subcutaneous tract we did experience some venous bleeding. Pressure was held on the tract for several minutes until there did not appear to be any more active bleeding. The incision on the chest wall was closed with a deep layer of interrupted 3-0 Vicryl stitches and the skin was then closed with a row of running 4-0 Monocryl  subcuticular stitch. Dermabond dressings and sterile dressings were applied. The patient tolerated the procedure well. At the end of the case the needle sponge and instrument counts were correct. The patient was then awakened and taken to recovery in stable condition.  PLAN OF CARE: Discharge to home after PACU  PATIENT DISPOSITION:  PACU - hemodynamically  stable.   Delay start of Pharmacological VTE agent (>24hrs) due to surgical blood loss or risk of bleeding: not applicable

## 2011-12-02 NOTE — Anesthesia Postprocedure Evaluation (Signed)
  Anesthesia Post-op Note  Patient: Jamie [name]  Procedure(s) Performed: Procedure(s) (LRB): INSERTION PORT-A-CATH (Right)  Patient Location: PACU  Anesthesia Type: General  Level of Consciousness: awake, alert  and oriented  Airway and Oxygen Therapy: Patient Spontanous Breathing  Post-op Pain: none  Post-op Assessment: Post-op Vital signs reviewed and Patient's Cardiovascular Status Stable  Post-op Vital Signs: stable  Complications: No apparent anesthesia complications

## 2011-12-02 NOTE — Preoperative (Signed)
Beta Blockers   Reason not to administer Beta Blockers:Not Applicable 

## 2011-12-02 NOTE — Anesthesia Preprocedure Evaluation (Addendum)
Anesthesia Evaluation  Patient identified by MRN, date of birth, ID band Patient awake    Reviewed: Allergy & Precautions, H&P , NPO status , Patient's Chart, lab work & pertinent test results, reviewed documented beta blocker date and time   Airway Mallampati: III TM Distance: >3 FB     Dental  (+) Teeth Intact   Pulmonary  breath sounds clear to auscultation        Cardiovascular Rhythm:Regular Rate:Normal     Neuro/Psych    GI/Hepatic   Endo/Other    Renal/GU      Musculoskeletal   Abdominal   Peds  Hematology   Anesthesia Other Findings   Reproductive/Obstetrics                           Anesthesia Physical Anesthesia Plan  ASA: III  Anesthesia Plan: MAC   Post-op Pain Management:    Induction: Intravenous  Airway Management Planned:   Additional Equipment:   Intra-op Plan:   Post-operative Plan:   Informed Consent: I have reviewed the patients History and Physical, chart, labs and discussed the procedure including the risks, benefits and alternatives for the proposed anesthesia with the patient or authorized representative who has indicated his/her understanding and acceptance.   Dental advisory given  Plan Discussed with:   Anesthesia Plan Comments: (Breast Ca Obesity, S/P gastric banding Htn  Plan MAC  Kipp Brood, MD)       Anesthesia Quick Evaluation

## 2011-12-02 NOTE — Anesthesia Procedure Notes (Signed)
Procedure Name: LMA Insertion Date/Time: 12/02/2011 7:37 AM Performed by: Gwenyth Allegra Pre-anesthesia Checklist: Timeout performed, Patient identified, Emergency Drugs available, Suction available and Patient being monitored Patient Re-evaluated:Patient Re-evaluated prior to inductionOxygen Delivery Method: Circle system utilized Preoxygenation: Pre-oxygenation with 100% oxygen Intubation Type: IV induction LMA: LMA inserted LMA Size: 4.0 Number of attempts: 1 Dental Injury: Teeth and Oropharynx as per pre-operative assessment

## 2011-12-02 NOTE — Interval H&P Note (Signed)
History and Physical Interval Note:  12/02/2011 7:14 AM  Jamie [name]  has presented today for surgery, with the diagnosis of left breast cancer  The various methods of treatment have been discussed with the patient and family. After consideration of risks, benefits and other options for treatment, the patient has consented to  Procedure(s) (LRB): INSERTION PORT-A-CATH (N/A) as a surgical intervention .  The patients' history has been reviewed, patient examined, no change in status, stable for surgery.  I have reviewed the patients' chart and labs.  Questions were answered to the patient's satisfaction.     TOTH III,[name]

## 2011-12-03 ENCOUNTER — Encounter (HOSPITAL_COMMUNITY): Payer: Self-pay | Admitting: General Surgery

## 2011-12-09 ENCOUNTER — Encounter (INDEPENDENT_AMBULATORY_CARE_PROVIDER_SITE_OTHER): Payer: Self-pay | Admitting: General Surgery

## 2011-12-09 ENCOUNTER — Inpatient Hospital Stay (HOSPITAL_COMMUNITY)
Admission: EM | Admit: 2011-12-09 | Discharge: 2011-12-18 | DRG: 233 | Disposition: A | Discharge status: Home Health | Payer: Medicare Other | Attending: Surgery | Admitting: Surgery

## 2011-12-09 ENCOUNTER — Encounter: Payer: Self-pay | Admitting: *Deleted

## 2011-12-09 ENCOUNTER — Other Ambulatory Visit (HOSPITAL_BASED_OUTPATIENT_CLINIC_OR_DEPARTMENT_OTHER): Payer: Medicare Other | Admitting: Lab

## 2011-12-09 ENCOUNTER — Ambulatory Visit (HOSPITAL_BASED_OUTPATIENT_CLINIC_OR_DEPARTMENT_OTHER): Payer: Medicare Other

## 2011-12-09 ENCOUNTER — Encounter (HOSPITAL_COMMUNITY): Payer: Self-pay | Admitting: Emergency Medicine

## 2011-12-09 ENCOUNTER — Encounter: Payer: Self-pay | Admitting: Physician Assistant

## 2011-12-09 ENCOUNTER — Other Ambulatory Visit: Payer: Self-pay

## 2011-12-09 ENCOUNTER — Ambulatory Visit (HOSPITAL_BASED_OUTPATIENT_CLINIC_OR_DEPARTMENT_OTHER): Payer: Medicare Other | Admitting: Physician Assistant

## 2011-12-09 VITALS — BP 135/82 | HR 91 | Temp 98.3°F | Ht 65.0 in | Wt 275.9 lb

## 2011-12-09 DIAGNOSIS — C50919 Malignant neoplasm of unspecified site of unspecified female breast: Secondary | ICD-10-CM

## 2011-12-09 DIAGNOSIS — D62 Acute posthemorrhagic anemia: Secondary | ICD-10-CM | POA: Diagnosis not present

## 2011-12-09 DIAGNOSIS — Z7982 Long term (current) use of aspirin: Secondary | ICD-10-CM

## 2011-12-09 DIAGNOSIS — E785 Hyperlipidemia, unspecified: Secondary | ICD-10-CM | POA: Diagnosis present

## 2011-12-09 DIAGNOSIS — E669 Obesity, unspecified: Secondary | ICD-10-CM | POA: Diagnosis present

## 2011-12-09 DIAGNOSIS — I219 Acute myocardial infarction, unspecified: Secondary | ICD-10-CM

## 2011-12-09 DIAGNOSIS — G4733 Obstructive sleep apnea (adult) (pediatric): Secondary | ICD-10-CM | POA: Diagnosis present

## 2011-12-09 DIAGNOSIS — I509 Heart failure, unspecified: Secondary | ICD-10-CM | POA: Diagnosis present

## 2011-12-09 DIAGNOSIS — N6320 Unspecified lump in the left breast, unspecified quadrant: Secondary | ICD-10-CM

## 2011-12-09 DIAGNOSIS — K5900 Constipation, unspecified: Secondary | ICD-10-CM | POA: Diagnosis not present

## 2011-12-09 DIAGNOSIS — C50119 Malignant neoplasm of central portion of unspecified female breast: Secondary | ICD-10-CM

## 2011-12-09 DIAGNOSIS — Z888 Allergy status to other drugs, medicaments and biological substances status: Secondary | ICD-10-CM

## 2011-12-09 DIAGNOSIS — I214 Non-ST elevation (NSTEMI) myocardial infarction: Principal | ICD-10-CM | POA: Diagnosis present

## 2011-12-09 DIAGNOSIS — J441 Chronic obstructive pulmonary disease with (acute) exacerbation: Secondary | ICD-10-CM | POA: Diagnosis present

## 2011-12-09 DIAGNOSIS — J449 Chronic obstructive pulmonary disease, unspecified: Secondary | ICD-10-CM | POA: Diagnosis present

## 2011-12-09 DIAGNOSIS — I5031 Acute diastolic (congestive) heart failure: Secondary | ICD-10-CM | POA: Diagnosis present

## 2011-12-09 DIAGNOSIS — C773 Secondary and unspecified malignant neoplasm of axilla and upper limb lymph nodes: Secondary | ICD-10-CM

## 2011-12-09 DIAGNOSIS — C50419 Malignant neoplasm of upper-outer quadrant of unspecified female breast: Secondary | ICD-10-CM

## 2011-12-09 DIAGNOSIS — I25118 Atherosclerotic heart disease of native coronary artery with other forms of angina pectoris: Secondary | ICD-10-CM | POA: Diagnosis present

## 2011-12-09 DIAGNOSIS — I2510 Atherosclerotic heart disease of native coronary artery without angina pectoris: Secondary | ICD-10-CM | POA: Diagnosis present

## 2011-12-09 DIAGNOSIS — Z79899 Other long term (current) drug therapy: Secondary | ICD-10-CM

## 2011-12-09 DIAGNOSIS — Z170 Estrogen receptor positive status [ER+]: Secondary | ICD-10-CM

## 2011-12-09 DIAGNOSIS — Z5111 Encounter for antineoplastic chemotherapy: Secondary | ICD-10-CM

## 2011-12-09 DIAGNOSIS — J4489 Other specified chronic obstructive pulmonary disease: Secondary | ICD-10-CM | POA: Diagnosis present

## 2011-12-09 DIAGNOSIS — Z951 Presence of aortocoronary bypass graft: Secondary | ICD-10-CM

## 2011-12-09 DIAGNOSIS — E119 Type 2 diabetes mellitus without complications: Secondary | ICD-10-CM | POA: Diagnosis present

## 2011-12-09 DIAGNOSIS — Z87891 Personal history of nicotine dependence: Secondary | ICD-10-CM

## 2011-12-09 DIAGNOSIS — I2582 Chronic total occlusion of coronary artery: Secondary | ICD-10-CM | POA: Diagnosis present

## 2011-12-09 LAB — CBC WITH DIFFERENTIAL/PLATELET
BASO%: 0.5 % (ref 0.0–2.0)
EOS%: 3.8 % (ref 0.0–7.0)
MCH: 28.3 pg (ref 25.1–34.0)
MCHC: 32.2 g/dL (ref 31.5–36.0)
MCV: 87.9 fL (ref 79.5–101.0)
MONO%: 10.2 % (ref 0.0–14.0)
RBC: 4.73 10*6/uL (ref 3.70–5.45)
RDW: 13.8 % (ref 11.2–14.5)

## 2011-12-09 MED ORDER — SODIUM CHLORIDE 0.9 % IV SOLN
150.0000 mg | Freq: Once | INTRAVENOUS | Status: AC
  Administered 2011-12-09: 150 mg via INTRAVENOUS
  Filled 2011-12-09: qty 5

## 2011-12-09 MED ORDER — PALONOSETRON HCL INJECTION 0.25 MG/5ML
0.2500 mg | Freq: Once | INTRAVENOUS | Status: AC
  Administered 2011-12-09: 0.25 mg via INTRAVENOUS

## 2011-12-09 MED ORDER — SODIUM CHLORIDE 0.9 % IV SOLN
600.0000 mg/m2 | Freq: Once | INTRAVENOUS | Status: AC
  Administered 2011-12-09: 1440 mg via INTRAVENOUS
  Filled 2011-12-09: qty 72

## 2011-12-09 MED ORDER — SODIUM CHLORIDE 0.9 % IV SOLN
Freq: Once | INTRAVENOUS | Status: AC
  Administered 2011-12-09: 14:00:00 via INTRAVENOUS

## 2011-12-09 MED ORDER — DEXAMETHASONE SODIUM PHOSPHATE 4 MG/ML IJ SOLN
12.0000 mg | Freq: Once | INTRAMUSCULAR | Status: AC
  Administered 2011-12-09: 12 mg via INTRAVENOUS

## 2011-12-09 MED ORDER — SODIUM CHLORIDE 0.9 % IV SOLN
60.0000 mg/m2 | Freq: Once | INTRAVENOUS | Status: DC
  Administered 2011-12-09: 144 mg via INTRAVENOUS
  Filled 2011-12-09: qty 72

## 2011-12-09 MED ORDER — HEPARIN SOD (PORK) LOCK FLUSH 100 UNIT/ML IV SOLN
500.0000 [IU] | Freq: Once | INTRAVENOUS | Status: DC | PRN
  Filled 2011-12-09: qty 5

## 2011-12-09 MED ORDER — SODIUM CHLORIDE 0.9 % IJ SOLN
10.0000 mL | INTRAMUSCULAR | Status: DC | PRN
  Filled 2011-12-09: qty 10

## 2011-12-09 NOTE — ED Notes (Signed)
Pt has breast cancer and had her first chemo treatment today  Pt is c/o shortness of breath, chest pain, and arms tingling  Pt states she had a double mastectomy about 6 weeks ago by Dr Carolynne Edouard at [facility]  Pt is seen by Dr Rennis Chris at the cancer center

## 2011-12-09 NOTE — ED Notes (Signed)
Pt states she took an extra fluid pill tonight but it did not help with her breathing  Pt has a portacath that is already accessed to the right chest

## 2011-12-09 NOTE — ED Notes (Signed)
[id number]> Expected date:<BR> Expected time:<BR> Means of arrival:<BR> Comments:<BR> CA pt

## 2011-12-09 NOTE — Progress Notes (Signed)
ID: Jamie [name]   DOB: 10/04/1945  MR#: [id number]  CSN#:[id number]  HISTORY OF PRESENT ILLNESS: The patient noted a mass in her left breast and brought it to the attention of her primary physician, Dr. Joetta Manners, who set her up for diagnostic mammography. This showed a mass of about 3 cm in diameter (I do not have a copy of that study or comparison with prior mammography from July of 2012). The patient was referred to Dr. Carolynne Edouard and he set her up for biopsy of the [facility]. The pathology showed [phone]) an invasive ductal carcinoma, grade 2, estrogen receptor 100% positive, progesterone receptor 96% positive, with an MIB-1 of 30%, and HER-2 equivocal with the ratio by CISH of 1.81.  Diagnostic right mammography was then performed 08/28/2011. Multiple masses were noted, and there were multiple soft tissue nodules by palpation. Ultrasound specifically showed 2 hypoechoic round masses measuring 1.3 and 0.9 cm respectively. 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 With this information and after extensive discussion the patient opted for bilateral mastectomies with left sentinel lymph node sampling, and this was performed 10/18/2011, with results and further treatment as discussed below.  INTERVAL HISTORY: The patient returns today with her friend, Kathie Rhodes, in anticipation of receiving her first adjuvant dose of doxorubicin/cyclophosphamide today. She has all of her antinausea medications on hand at home, including dexamethasone, prochlorperazine, and lorazepam. She has had her port placed, with no complications.  She continues to be followed by Dr. Carolynne Edouard, as well as Dr. Maudie Mercury, and is still having her expanders filled which she finds very uncomfortable.  She is ready to proceed with her chemotherapy.  REVIEW OF SYSTEMS: Anaise is mildly fatigued. Her biggest complaint continues to be pain and discomfort secondary to  the expanders. She's had no fevers, chills, or night sweats. No nausea or emesis. She does have shortness of breath with even mild exertion, but this is not new for her. No chest pain or palpitations. No abnormal headaches or dizziness.  A detailed review of systems is otherwise noncontributory.  PAST MEDICAL HISTORY: Past Medical History  Diagnosis Date  . Left breast mass   . Asthma   . COPD (chronic obstructive pulmonary disease)   . Shortness of breath   . Sleep apnea     sleep study2-3 yrs ago lost weight afterward but gained back  . UTI (lower urinary tract infection)     freq-bladder implant -removed  . lt breast ca dx'd 08/2011  . Headache     hx migraines  hx tobacco abuse, 80 P/Y, resolved  PAST SURGICAL HISTORY: Past Surgical History  Procedure Date  . Cesarean section 04/1976  . Abdominal hysterectomy 1977  . Laparoscopic gastric banding 2010  . Nasal sinus surgery   . Tonsillectomy   . Appendectomy   . Mastectomy w/ sentinel node biopsy 10/18/2011    Procedure: MASTECTOMY WITH SENTINEL LYMPH NODE BIOPSY;  Surgeon: Robyne Askew, MD;  Location: [facility] OR;  Service: General;  Laterality: Bilateral;  bilateral mastectomy and left sentinel node biopsy  . Axillary lymph node dissection 10/18/2011    Procedure: AXILLARY LYMPH NODE DISSECTION;  Surgeon: Robyne Askew, MD;  Location: [facility] OR;  Service: General;  Laterality: Left;  . Breast reconstruction 10/18/2011    Procedure: BREAST RECONSTRUCTION;  Surgeon: Wayland Denis, DO;  Location: [facility] OR;  Service: Plastics;  Laterality: Bilateral;   bilateral breast reconstruction with bilateral  tissue expander and placement of flex hd.  . US echocardiography     at [facility]  . Portacath placement 12/02/2011    Procedure: INSERTION PORT-A-CATH;  Surgeon: Robyne Askew, MD;  Location: [facility] OR;  Service: General;  Laterality: Right;  no salpingo-oophorectomy  FAMILY HISTORY Family History  Problem Relation Age of Onset  .  Anesthesia problems Neg Hx   . Hypotension Neg Hx   . Malignant hyperthermia Neg Hx   . Pseudochol deficiency Neg Hx   The patient's father died at the age of 38. He had a history of asbestos exposure. The patient's mother died at the age of 20 from heart disease. The patient had one brother, no sisters. There is no breast or ovarian cancer in the immediate family but 2 of the patient's 5 maternal aunts had breast cancer (she does not know the age of diagnosis), and one of her father's sisters had ovarian cancer.  GYNECOLOGIC HISTORY: She had menarche age 14, no firm date for her menopause as she had hysterectomy remotely. She is GX P2, with a pair of twins and a daughter as her biological children as noted below. She never took hormone replacement.  SOCIAL HISTORY: She owns a tuxedo shop. Her husband of 34 years, [name], is retired but used to own a Designer, television/film set. He is currently working in IT with one of his own sons. At home in addition to the patient and her husband is one of the patient's daughters and her 2 children, as well as one of Forrest sons. Altogether they have 6 children and 15 grandchildren. The patient attends [employer]    ADVANCED DIRECTIVES:  HEALTH MAINTENANCE: History  Substance Use Topics  . Smoking status: Former Smoker -- 2.0 packs/day    Quit date: 08/25/2006  . Smokeless tobacco: Never Used   Comment: social drinker 2-3 yr  . Alcohol Use: Yes     rarely     Colonoscopy: 2012  PAP: s/p hysterectomy  Bone density:  Lipid panel:  Allergies  Allergen Reactions  . Vicodin (Hydrocodone-Acetaminophen) Other (See Comments)    syncope  . Sulfa Antibiotics Other (See Comments)    unknown    Current Outpatient Prescriptions  Medication Sig Dispense Refill  . dexamethasone (DECADRON) 4 MG tablet Take 8 mg by mouth 2 (two) times daily with a meal.      . gabapentin (NEURONTIN) 300 MG capsule Take 1 capsule (300 mg total) by mouth 3 (three) times daily.   60 capsule  3  . loratadine (CLARITIN) 10 MG tablet Take 10 mg by mouth daily.      Marland Kitchen LORazepam (ATIVAN) 0.5 MG tablet Take 0.5 mg by mouth at bedtime as needed.      Marland Kitchen oxyCODONE (OXY IR/ROXICODONE) 5 MG immediate release tablet Take 1 tablet (5 mg total) by mouth at bedtime as needed for pain.  30 tablet  0  . oxyCODONE-acetaminophen (ROXICET) 5-325 MG per tablet Take 1 tablet by mouth every 4 (four) hours as needed for pain.  50 tablet  0  . prochlorperazine (COMPAZINE) 10 MG tablet Take 10 mg by mouth every 6 (six) hours.      . simvastatin (ZOCOR) 20 MG tablet Take 20 mg by mouth Daily.       Marland Kitchen tiotropium (SPIRIVA) 18 MCG inhalation capsule Place 18 mcg into inhaler and inhale daily.      Marland Kitchen triamcinolone cream (KENALOG) 0.1 %       . albuterol (  PROVENTIL HFA;VENTOLIN HFA) 108 (90 BASE) MCG/ACT inhaler Inhale 2 puffs into the lungs every 6 (six) hours as needed. For wheezing      . diazepam (VALIUM) 5 MG tablet Take 5 mg by mouth at bedtime as needed. For sleep.      . furosemide (LASIX) 20 MG tablet Take 20 mg by mouth 2 (two) times daily.       . Nutritional Supplements (JUICE PLUS FIBRE PO) Take 3 capsules by mouth 2 (two) times daily.      Marland Kitchen DISCONTD: prochlorperazine (COMPAZINE) 10 MG tablet Take 1 tablet (10 mg total) by mouth every 6 (six) hours.  60 tablet  3    OBJECTIVE: Middle-aged white woman in no acute distress Filed Vitals:   12/09/11 1121  BP: 135/82  Pulse: 91  Temp: 98.3 F (36.8 C)     Body mass index is 45.91 kg/(m^2).    ECOG FS: 2  Physical Exam: HEENT:  Sclerae anicteric, conjunctivae pink.  Oropharynx clear.  No mucositis or candidiasis.   Nodes:  No cervical, supraclavicular, or axillary lymphadenopathy palpated.  Breast Exam:  Deferred   Lungs:  Clear to auscultation bilaterally.  No crackles, rhonchi, or wheezes.   Heart:  Regular rate and rhythm.   Abdomen:  Soft, obese, nontender.  Positive bowel sounds.  No organomegaly or masses palpated.     Musculoskeletal:  No focal spinal tenderness to palpation.  Extremities:  Benign.  No peripheral edema or cyanosis.   Skin:  Benign.   Neuro:  Nonfocal. Alert and oriented x3.    LAB RESULTS: Lab Results  Component Value Date   WBC 6.4 12/09/2011   NEUTROABS 3.7 12/09/2011   HGB 13.4 12/09/2011   HCT 41.6 12/09/2011   MCV 87.9 12/09/2011   PLT 145 12/09/2011      Chemistry      Component Value Date/Time   NA 142 12/02/2011 0612   K 3.8 12/02/2011 0612   CL 105 12/02/2011 0612   CO2 27 12/02/2011 0612   BUN 12 12/02/2011 0612   CREATININE 0.71 12/02/2011 0612      Component Value Date/Time   CALCIUM 9.3 12/02/2011 0612   ALKPHOS 69 09/03/2011 1549   AST 32 09/03/2011 1549   ALT 39* 09/03/2011 1549   BILITOT 0.2* 09/03/2011 1549       Lab Results  Component Value Date   LABCA2 27 09/03/2011     STUDIES:  Echocardiogram was obtained at [facility] in [facility] on 09/03/2011, with an ejection fraction of 65%.   Dg Chest 2 View  10/14/2011  *RADIOLOGY REPORT*  Clinical Data: Preop radiograph  CHEST - 2 VIEW  Comparison: 09/10/2011  Findings: Normal heart size.  No pleural effusion or pulmonary edema.  No airspace consolidation identified.  Decreased lung volumes and coarsened interstitial markings are noted.  There is spondylosis identified within the thoracic spine.  IMPRESSION:  1.  No acute cardiopulmonary abnormalities.  Original Report Authenticated By: Rosealee Albee, M.D.   ASSESSMENT: 66 year-old [name] woman   (1)  s/p bilateral mastectomies and left axillary lymph node dissection 10/18/2011 for a left-sided pT2 pN1, stage IIB invasive ductal carcinoma, grade 2, estrogen receptor 100% and progesterone receptor 96% positive, with no HER-2 amplification (1.81, repeat 1.35), Mib-1 of 30%. The right breast showed no malignancy.  (2)  being treated in the adjuvant setting the plan being to complete 4 dose dense cycles of doxorubicin/cyclophosphamide followed  by 4 dose dense  cycles of paclitaxel. Doxorubicin given by continuous infusion over 72 hours, with Neulasta given at the time of pump removal.  PLAN:  Antonette will proceed to treatment today as scheduled for her first dose of adjuvant doxorubicin/cyclophosphamide. Again, she'll receive the doxorubicin by continuous infusion, and is scheduled to return on Thursday, May night, to have her port discontinued. She will have Neulasta injection on the same day. I will see her next week for assessment chemotoxicity on May 13.  We again reviewed Anela's  antinausea regimen, and she was given written instructions on how to utilize her dexamethasone, prochlorperazine, and lorazepam appropriately. She voices understanding and agreement on how to utilize these medications, knows to call with any changes.  At this time we are dosing at full body weight, but we will change to ideal body weight if cytopenias are excessive. After the first chemotherapy we will refer her to rehabilitation to start working on the mobility particularly of the left upper extremity. She knows to call for any problems that may develop before the next visit  Everet Flagg    12/09/2011

## 2011-12-09 NOTE — Progress Notes (Signed)
Pt accidentally took pump folder home at discharge.  Called and left msg that she needs to return folder on Thursday when she comes in for her pump d/c.  Asked her to call Dr Magrinat's RN for any additional questions.  SLJ

## 2011-12-10 ENCOUNTER — Inpatient Hospital Stay (HOSPITAL_COMMUNITY): Payer: Medicare Other

## 2011-12-10 ENCOUNTER — Encounter: Payer: Self-pay | Admitting: *Deleted

## 2011-12-10 ENCOUNTER — Emergency Department (HOSPITAL_COMMUNITY): Payer: Medicare Other

## 2011-12-10 ENCOUNTER — Other Ambulatory Visit: Payer: Self-pay

## 2011-12-10 ENCOUNTER — Encounter (HOSPITAL_COMMUNITY): Payer: Self-pay | Admitting: Family Medicine

## 2011-12-10 DIAGNOSIS — I509 Heart failure, unspecified: Secondary | ICD-10-CM

## 2011-12-10 DIAGNOSIS — R7989 Other specified abnormal findings of blood chemistry: Secondary | ICD-10-CM

## 2011-12-10 DIAGNOSIS — J449 Chronic obstructive pulmonary disease, unspecified: Secondary | ICD-10-CM | POA: Diagnosis present

## 2011-12-10 DIAGNOSIS — E785 Hyperlipidemia, unspecified: Secondary | ICD-10-CM | POA: Diagnosis present

## 2011-12-10 DIAGNOSIS — J441 Chronic obstructive pulmonary disease with (acute) exacerbation: Secondary | ICD-10-CM | POA: Diagnosis present

## 2011-12-10 DIAGNOSIS — J438 Other emphysema: Secondary | ICD-10-CM

## 2011-12-10 DIAGNOSIS — E8779 Other fluid overload: Secondary | ICD-10-CM

## 2011-12-10 DIAGNOSIS — R0789 Other chest pain: Secondary | ICD-10-CM

## 2011-12-10 DIAGNOSIS — R799 Abnormal finding of blood chemistry, unspecified: Secondary | ICD-10-CM

## 2011-12-10 DIAGNOSIS — C50919 Malignant neoplasm of unspecified site of unspecified female breast: Secondary | ICD-10-CM

## 2011-12-10 DIAGNOSIS — I25118 Atherosclerotic heart disease of native coronary artery with other forms of angina pectoris: Secondary | ICD-10-CM | POA: Diagnosis present

## 2011-12-10 DIAGNOSIS — C50419 Malignant neoplasm of upper-outer quadrant of unspecified female breast: Secondary | ICD-10-CM

## 2011-12-10 DIAGNOSIS — E669 Obesity, unspecified: Secondary | ICD-10-CM | POA: Diagnosis present

## 2011-12-10 LAB — BASIC METABOLIC PANEL
BUN: 21 mg/dL (ref 6–23)
CO2: 27 mEq/L (ref 19–32)
Chloride: 100 mEq/L (ref 96–112)
Chloride: 100 mEq/L (ref 96–112)
GFR calc Af Amer: >90 mL/min (ref >90)
GFR calc Af Amer: 77 mL/min — ABNORMAL LOW (ref >90)
GFR calc non Af Amer: 86 mL/min — ABNORMAL LOW (ref >90)
Glucose, Bld: 425 mg/dL — ABNORMAL HIGH (ref 70–99)
Potassium: 3.7 mEq/L (ref 3.5–5.1)
Potassium: 3.8 mEq/L (ref 3.5–5.1)
Sodium: 136 mEq/L (ref 135–145)

## 2011-12-10 LAB — CK TOTAL AND CKMB (NOT AT ARMC)
CK, MB: 9.2 ng/mL (ref 0.3–4.0)
CK, MB: 7.8 ng/mL (ref 0.3–4.0)
Total CK: 92 U/L (ref 7–177)
Total CK: 91 U/L (ref 7–177)

## 2011-12-10 LAB — CBC
Hemoglobin: 13.8 g/dL (ref 12.0–15.0)
MCH: 28.3 pg (ref 26.0–34.0)
MCHC: 32.4 g/dL (ref 30.0–36.0)
Platelets: 156 10*3/uL (ref 150–400)
RBC: 4.88 MIL/uL (ref 3.87–5.11)

## 2011-12-10 LAB — TROPONIN I
Troponin I: 1.1 ng/mL (ref <0.30)
Troponin I: 1.38 ng/mL (ref <0.30)
Troponin I: 1.21 ng/mL (ref <0.30)

## 2011-12-10 LAB — DIFFERENTIAL
Basophils Relative: 0 % (ref 0–1)
Eosinophils Absolute: 0 10*3/uL (ref 0.0–0.7)
Lymphs Abs: 0.7 10*3/uL (ref 0.7–4.0)
Neutro Abs: 4.3 10*3/uL (ref 1.7–7.7)
Neutrophils Relative %: 84 % — ABNORMAL HIGH (ref 43–77)

## 2011-12-10 LAB — PRO B NATRIURETIC PEPTIDE: Pro B Natriuretic peptide (BNP): 525.3 pg/mL — ABNORMAL HIGH (ref 0–125)

## 2011-12-10 LAB — LIPID PANEL
HDL: 41 mg/dL (ref >39)
LDL Cholesterol: 108 mg/dL — ABNORMAL HIGH (ref 0–99)
Triglycerides: 85 mg/dL (ref <150)

## 2011-12-10 MED ORDER — OXYCODONE-ACETAMINOPHEN 5-325 MG PO TABS
1.0000 | ORAL_TABLET | ORAL | Status: DC | PRN
  Administered 2011-12-10: 1 via ORAL
  Filled 2011-12-10: qty 1

## 2011-12-10 MED ORDER — INSULIN ASPART 100 UNIT/ML ~~LOC~~ SOLN
0.0000 [IU] | Freq: Every day | SUBCUTANEOUS | Status: DC
  Administered 2011-12-11: 2 [IU] via SUBCUTANEOUS
  Administered 2011-12-12: 4 [IU] via SUBCUTANEOUS

## 2011-12-10 MED ORDER — TIOTROPIUM BROMIDE MONOHYDRATE 18 MCG IN CAPS
18.0000 ug | ORAL_CAPSULE | Freq: Every day | RESPIRATORY_TRACT | Status: DC
  Administered 2011-12-10 – 2011-12-13 (×3): 18 ug via RESPIRATORY_TRACT
  Filled 2011-12-10 (×2): qty 5

## 2011-12-10 MED ORDER — DEXAMETHASONE 4 MG PO TABS
8.0000 mg | ORAL_TABLET | Freq: Two times a day (BID) | ORAL | Status: DC
  Filled 2011-12-10 (×3): qty 2

## 2011-12-10 MED ORDER — HEPARIN BOLUS VIA INFUSION: 4000.0000 [IU] | Freq: Once | INTRAVENOUS | Status: DC

## 2011-12-10 MED ORDER — ALBUTEROL SULFATE (5 MG/ML) 0.5% IN NEBU: 2.5000 mg | INHALATION_SOLUTION | RESPIRATORY_TRACT | Status: DC | PRN

## 2011-12-10 MED ORDER — SODIUM CHLORIDE 0.9 % IJ SOLN: 3.0000 mL | INTRAMUSCULAR | Status: DC | PRN

## 2011-12-10 MED ORDER — DIPHENHYDRAMINE HCL 12.5 MG/5ML PO ELIX
12.5000 mg | ORAL_SOLUTION | Freq: Three times a day (TID) | ORAL | Status: DC
  Administered 2011-12-10 – 2011-12-12 (×7): 12.5 mg via ORAL
  Filled 2011-12-10 (×13): qty 5

## 2011-12-10 MED ORDER — METOCLOPRAMIDE HCL 10 MG PO TABS
10.0000 mg | ORAL_TABLET | Freq: Three times a day (TID) | ORAL | Status: DC
  Administered 2011-12-10 – 2011-12-12 (×7): 10 mg via ORAL
  Filled 2011-12-10 (×13): qty 1

## 2011-12-10 MED ORDER — INSULIN ASPART 100 UNIT/ML ~~LOC~~ SOLN
0.0000 [IU] | Freq: Three times a day (TID) | SUBCUTANEOUS | Status: DC
  Administered 2011-12-10 (×2): 15 [IU] via SUBCUTANEOUS

## 2011-12-10 MED ORDER — ENOXAPARIN SODIUM 40 MG/0.4ML ~~LOC~~ SOLN: 40.0000 mg | SUBCUTANEOUS | Status: DC

## 2011-12-10 MED ORDER — ENOXAPARIN SODIUM 60 MG/0.6ML ~~LOC~~ SOLN
60.0000 mg | SUBCUTANEOUS | Status: DC
  Administered 2011-12-10: 60 mg via SUBCUTANEOUS
  Filled 2011-12-10 (×2): qty 0.6

## 2011-12-10 MED ORDER — LORATADINE 10 MG PO TABS
10.0000 mg | ORAL_TABLET | Freq: Every day | ORAL | Status: DC
  Administered 2011-12-10 – 2011-12-13 (×4): 10 mg via ORAL
  Filled 2011-12-10 (×5): qty 1

## 2011-12-10 MED ORDER — SODIUM CHLORIDE 0.9 % IV BOLUS (SEPSIS): 500.0000 mL | Freq: Once | INTRAVENOUS | Status: DC

## 2011-12-10 MED ORDER — SODIUM CHLORIDE 0.9 % IJ SOLN
3.0000 mL | Freq: Two times a day (BID) | INTRAMUSCULAR | Status: DC
  Administered 2011-12-11 – 2011-12-13 (×4): 3 mL via INTRAVENOUS

## 2011-12-10 MED ORDER — ALUM & MAG HYDROXIDE-SIMETH 200-200-20 MG/5ML PO SUSP: 30.0000 mL | Freq: Four times a day (QID) | ORAL | Status: DC | PRN

## 2011-12-10 MED ORDER — IPRATROPIUM BROMIDE 0.02 % IN SOLN: 0.5000 mg | RESPIRATORY_TRACT | Status: DC | PRN

## 2011-12-10 MED ORDER — ASPIRIN EC 325 MG PO TBEC
325.0000 mg | DELAYED_RELEASE_TABLET | Freq: Every day | ORAL | Status: DC
  Administered 2011-12-10: 325 mg via ORAL
  Filled 2011-12-10 (×2): qty 1

## 2011-12-10 MED ORDER — INSULIN ASPART 100 UNIT/ML ~~LOC~~ SOLN: 0.0000 [IU] | Freq: Every day | SUBCUTANEOUS | Status: DC

## 2011-12-10 MED ORDER — ENOXAPARIN SODIUM 40 MG/0.4ML ~~LOC~~ SOLN
40.0000 mg | SUBCUTANEOUS | Status: DC
  Filled 2011-12-10: qty 0.4

## 2011-12-10 MED ORDER — ACETAMINOPHEN 650 MG RE SUPP: 650.0000 mg | Freq: Four times a day (QID) | RECTAL | Status: DC | PRN

## 2011-12-10 MED ORDER — LORAZEPAM 0.5 MG PO TABS: 0.5000 mg | ORAL_TABLET | Freq: Every evening | ORAL | Status: DC | PRN

## 2011-12-10 MED ORDER — ONDANSETRON HCL 4 MG/2ML IJ SOLN: 4.0000 mg | Freq: Four times a day (QID) | INTRAMUSCULAR | Status: DC | PRN

## 2011-12-10 MED ORDER — FUROSEMIDE 10 MG/ML IJ SOLN
40.0000 mg | Freq: Two times a day (BID) | INTRAMUSCULAR | Status: DC
  Administered 2011-12-10 – 2011-12-12 (×5): 40 mg via INTRAVENOUS
  Filled 2011-12-10 (×8): qty 4

## 2011-12-10 MED ORDER — ASPIRIN 325 MG PO TABS
325.0000 mg | ORAL_TABLET | Freq: Every day | ORAL | Status: DC
  Filled 2011-12-10: qty 1

## 2011-12-10 MED ORDER — HEPARIN (PORCINE) IN NACL 100-0.45 UNIT/ML-% IJ SOLN
1000.0000 [IU]/h | INTRAMUSCULAR | Status: DC
  Filled 2011-12-10: qty 250

## 2011-12-10 MED ORDER — GABAPENTIN 300 MG PO CAPS
300.0000 mg | ORAL_CAPSULE | Freq: Three times a day (TID) | ORAL | Status: DC
  Administered 2011-12-10 – 2011-12-13 (×9): 300 mg via ORAL
  Filled 2011-12-10 (×13): qty 1

## 2011-12-10 MED ORDER — INSULIN ASPART 100 UNIT/ML ~~LOC~~ SOLN
8.0000 [IU] | Freq: Once | SUBCUTANEOUS | Status: AC
  Administered 2011-12-10: 8 [IU] via SUBCUTANEOUS

## 2011-12-10 MED ORDER — NITROGLYCERIN 2 % TD OINT
0.5000 [in_us] | TOPICAL_OINTMENT | Freq: Four times a day (QID) | TRANSDERMAL | Status: DC
  Administered 2011-12-10 – 2011-12-13 (×12): 0.5 [in_us] via TOPICAL
  Filled 2011-12-10 (×3): qty 30

## 2011-12-10 MED ORDER — ONDANSETRON HCL 4 MG PO TABS: 4.0000 mg | ORAL_TABLET | Freq: Four times a day (QID) | ORAL | Status: DC | PRN

## 2011-12-10 MED ORDER — INSULIN GLARGINE 100 UNIT/ML ~~LOC~~ SOLN
10.0000 [IU] | Freq: Once | SUBCUTANEOUS | Status: AC
  Administered 2011-12-10: 10 [IU] via SUBCUTANEOUS

## 2011-12-10 MED ORDER — SODIUM CHLORIDE 0.9 % IJ SOLN
10.0000 mL | INTRAMUSCULAR | Status: DC | PRN
  Administered 2011-12-11 – 2011-12-13 (×2): 10 mL

## 2011-12-10 MED ORDER — IOHEXOL 300 MG/ML  SOLN
100.0000 mL | Freq: Once | INTRAMUSCULAR | Status: AC | PRN
  Administered 2011-12-10: 100 mL via INTRAVENOUS

## 2011-12-10 MED ORDER — SIMVASTATIN 20 MG PO TABS
20.0000 mg | ORAL_TABLET | Freq: Every day | ORAL | Status: DC
  Administered 2011-12-10: 20 mg via ORAL
  Filled 2011-12-10 (×2): qty 1

## 2011-12-10 MED ORDER — INSULIN ASPART 100 UNIT/ML ~~LOC~~ SOLN
0.0000 [IU] | Freq: Three times a day (TID) | SUBCUTANEOUS | Status: DC
  Administered 2011-12-11: 7 [IU] via SUBCUTANEOUS
  Administered 2011-12-12: 3 [IU] via SUBCUTANEOUS
  Administered 2011-12-12 – 2011-12-13 (×3): 4 [IU] via SUBCUTANEOUS

## 2011-12-10 MED ORDER — ACETAMINOPHEN 325 MG PO TABS: 650.0000 mg | ORAL_TABLET | Freq: Four times a day (QID) | ORAL | Status: DC | PRN

## 2011-12-10 NOTE — Progress Notes (Signed)
Inpatient Diabetes Program Recommendations  AACE/ADA: New Consensus Statement on Inpatient Glycemic Control (2009)  Target Ranges:  Prepandial:   less than 140 mg/dL      Peak postprandial:   less than 180 mg/dL (1-2 hours)      Critically ill patients:  140 - 180 mg/dL   Reason for Visit: Hyperglycemia  Says had DM 2 in past which resolved after Lap banding.   Results for ELIZEBETH, KLUESNER (MRN [id number]) as of 12/10/2011 15:54  Ref. Range 12/10/2011 05:49  Hemoglobin A1C Latest Range: <5.7 % 6.7 (H)  Results for TIONA, RUANE (MRN [id number]) as of 12/10/2011 15:54  Ref. Range 12/10/2011 01:45  Glucose Latest Range: 70-99 mg/dL 782 (H)  Results for JOCHEBED, BILLS (MRN [id number]) as of 12/10/2011 15:54  Ref. Range 12/10/2011 11:58 12/10/2011 13:57 12/10/2011 15:03  Glucose-Capillary Latest Range: 70-99 mg/dL 578 (HH) 469 (H) 629 (H)    Inpatient Diabetes Program Recommendations Insulin - Basal: Agree with Lantus 10 QHS Correction (SSI): Increase Novolog to resistant tidwc and hs Insulin - Meal Coverage: may need meal coverage insulin Diet: Change to CHO-mod medium  Discussed with RN.  Will followup tomorrow morning.

## 2011-12-10 NOTE — Progress Notes (Signed)
UNABLE TO COMPLETE CHEMO FOLLOW UP CALL. ON 12/09/11 PT WAS ADMITTED TO THE HOSPITAL WITH ACUTE DIASTOLIC CONGESTIVE HEART FAILURE. NOTIFIED DR.MAGRINAT'S NURSE, VAL DODD,RN.

## 2011-12-10 NOTE — Progress Notes (Signed)
Pt CK,MB 9.6.  Pt glucose at 0145 was 421.  Notified MD.  Will continue to monitor.

## 2011-12-10 NOTE — Progress Notes (Signed)
Pt complains of chest pain.  Adm Percocet one tablet.  Performed EKG.  Pt was adm nitroglycerin paste at 1323.  MD notified.  Will continue to monitor.

## 2011-12-10 NOTE — Consult Note (Signed)
CARDIOLOGY CONSULT NOTE    Patient ID: Jamie [name] MRN: [id number] DOB/AGE: Nov 04, 1945 66 y.o.  Admit date: 12/09/2011 Referring Physician: Delton Coombes Primary Physician: Raynelle Jan., MD, MD Primary Cardiologist: New Reason for Consultation: Chest Pain CHF  Principal Problem:  *Elevated troponin Active Problems:  Breast cancer  CHF (congestive heart failure)  COPD (chronic obstructive pulmonary disease)  Hyperlipidemia  Obesity   HPI:  66 yo newly diagnosed breast CA in January.  Has had a rough course the last 6 weeks.  Had biopsy, bilateral mastectomy, expander placement, and porta cath.  She has had atypical SSCP since mastectomy and  Expanders have been painful.  Had first cycle of adriamycin based chemo yesterday and presents to ER wit CHF/dyspnea and pains in chest and arms.  BNP over 500 and mildly elevated troponin.  ECG with no acute changes and  CPK not checked.  She has chronic dyspnea form COPD and sleep apnea.  Orthopnea since mastectomy.  Had echo in January at [facility] which I reviewed.  Mild LVH with EF 60-65%.  No history of CAD.  SSCP since mastectomy and some difficulty taking a deep breath.  Currently much improved breathing with good diureses from Lasix given in ER  @ROS @ All other systems reviewed and negative except as noted above  Past Medical History  Diagnosis Date  . Left breast mass   . Asthma   . COPD (chronic obstructive pulmonary disease)   . Shortness of breath   . Sleep apnea     sleep study2-3 yrs ago lost weight afterward but gained back  . UTI (lower urinary tract infection)     freq-bladder implant -removed  . lt breast ca dx'd 08/2011  . Headache     hx migraines    Family History  Problem Relation Age of Onset  . Anesthesia problems Neg Hx   . Hypotension Neg Hx   . Malignant hyperthermia Neg Hx   . Pseudochol deficiency Neg Hx     History   Social History  . Marital Status: Married    Spouse Name: N/A    Number of  Children: N/A  . Years of Education: N/A   Occupational History  . Not on file.   Social History Main Topics  . Smoking status: Former Smoker -- 2.0 packs/day    Quit date: 08/25/2006  . Smokeless tobacco: Never Used   Comment: social drinker 2-3 yr  . Alcohol Use: Yes     rarely  . Drug Use: No  . Sexually Active: Not on file   Other Topics Concern  . Not on file   Social History Narrative  . No narrative on file    Past Surgical History  Procedure Date  . Cesarean section 04/1976  . Abdominal hysterectomy 1977  . Laparoscopic gastric banding 2010  . Nasal sinus surgery   . Tonsillectomy   . Appendectomy   . Mastectomy w/ sentinel node biopsy 10/18/2011    Procedure: MASTECTOMY WITH SENTINEL LYMPH NODE BIOPSY;  Surgeon: Robyne Askew, MD;  Location: [facility] OR;  Service: General;  Laterality: Bilateral;  bilateral mastectomy and left sentinel node biopsy  . Axillary lymph node dissection 10/18/2011    Procedure: AXILLARY LYMPH NODE DISSECTION;  Surgeon: Robyne Askew, MD;  Location: [facility] OR;  Service: General;  Laterality: Left;  . Breast reconstruction 10/18/2011    Procedure: BREAST RECONSTRUCTION;  Surgeon: Wayland Denis, DO;  Location: [facility] OR;  Service: Plastics;  Laterality: Bilateral;  bilateral breast reconstruction with bilateral tissue expander and placement of flex hd.  . US echocardiography     at [facility]  . Portacath placement 12/02/2011    Procedure: INSERTION PORT-A-CATH;  Surgeon: Robyne Askew, MD;  Location: [facility] OR;  Service: General;  Laterality: Right;        . aspirin  325 mg Oral Daily  . dexamethasone  8 mg Oral BID WC  . enoxaparin (LOVENOX) injection  40 mg Subcutaneous Q24H  . furosemide  40 mg Intravenous Q12H  . gabapentin  300 mg Oral TID  . loratadine  10 mg Oral Daily  . nitroGLYCERIN  0.5 inch Topical Q6H  . simvastatin  20 mg Oral q1800  . sodium chloride  3 mL Intravenous Q12H  . tiotropium  18 mcg Inhalation Daily  . DISCONTD:  enoxaparin  40 mg Subcutaneous Q24H  . DISCONTD: enoxaparin  40 mg Subcutaneous Q24H  . DISCONTD: heparin  4,000 Units Intravenous Once      . DISCONTD: heparin      Physical Exam: Blood pressure 131/73, pulse 81, temperature 97.5 F (36.4 C), temperature source Oral, resp. rate 19, height 5\' 5"  (1.651 m), weight 124.3 kg (274 lb 0.5 oz), SpO2 97.00%.    Affect appropriate frustrated by illness Obese white female Porta cath right subclavian HEENT: normal Neck supple with no adenopathy JVP normal no bruits no thyromegaly Lungs inspitory crackles at bases good diaphragmatic motion Heart:  S1/S2 no murmur, no rub, gallop or click PMI normal Abdomen: benighn, BS positve, no tenderness, no AAA no bruit.  No HSM or HJR Distal pulses intact with no bruits No edema Neuro non-focal Skin warm and dry No muscular weakness  Labs:   Lab Results  Component Value Date   WBC 5.1 12/10/2011   HGB 13.8 12/10/2011   HCT 42.6 12/10/2011   MCV 87.3 12/10/2011   PLT 156 12/10/2011    Lab 12/10/11 0145  NA 136  K 3.7  CL 100  CO2 21  BUN 15  CREATININE 0.74  CALCIUM 9.1  PROT --  BILITOT --  ALKPHOS --  ALT --  AST --  GLUCOSE 421*   Lab Results  Component Value Date   TROPONINI 2.31* 12/10/2011    Lab Results  Component Value Date   CHOL 166 12/10/2011   Lab Results  Component Value Date   HDL 41 12/10/2011   Lab Results  Component Value Date   LDLCALC 108* 12/10/2011   Lab Results  Component Value Date   TRIG 85 12/10/2011   Lab Results  Component Value Date   CHOLHDL 4.0 12/10/2011   No results found for this basename: LDLDIRECT      Radiology: Dg Chest 2 View  12/10/2011  *RADIOLOGY REPORT*  Clinical Data: Shortness of breath; history of breast cancer.  CHEST - 2 VIEW  Comparison: Chest radiograph performed 12/02/2011  Findings: The lungs are well-aerated.  Vascular congestion is noted.  Mild left basilar airspace opacity likely reflects atelectasis.  There is no definite  evidence for significant pulmonary edema.  There is no evidence of pleural effusion or pneumothorax.  The heart is borderline normal in size; the mediastinal contour is within normal limits.  No acute osseous abnormalities are seen.  A right-sided chest port is noted ending about the distal SVC. Bilateral breast implants are seen.  IMPRESSION: Vascular congestion noted; mild left basilar airspace opacity likely reflects atelectasis, though pneumonia could conceivably have a similar appearance.  Original Report Authenticated By: Tonia Ghent, M.D.   Dg Chest Port 1 View  12/02/2011  *RADIOLOGY REPORT*  Clinical Data: Port placement.  PORTABLE CHEST - 1 VIEW  Comparison: 10/14/2011  Findings: Right Port-A-Cath is in place with the tip in the SVC. No pneumothorax.  Cardiomegaly.  Mild peribronchial thickening and bibasilar atelectasis.  No effusions.  IMPRESSION: Right Port-A-Cath tip in the SVC.  No pneumothorax.  Original Report Authenticated By: Cyndie Chime, M.D.   Dg Fluoro Guide Cv Line-no Report  12/02/2011  CLINICAL DATA: Port a cath   FLOURO GUIDE CV LINE  Fluoroscopy was utilized by the requesting physician.  No radiographic  interpretation.      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  Likely related to first dose of chemo  Doubt systolic failure hopefully diastolic.  Echo ordered.  Continue lasix Chest Pain:  Troponin elevation likely nonspecfic possibly related to CHF.  Check CPK.  Has had it ever since mastectomy.  If echo normal and CPK negative consider outpatient Dobutamine echo.  Would like to avoid stress test with any radiation dosing.  Check d-dimer Breast CA:  Dr Darnelle Catalan to see.  ? Possible acute reaction to adriamycin and or decadron.  May need to adjust chemo regiman.    SignedCharlton Haws 12/10/2011, 8:49 AM

## 2011-12-10 NOTE — Progress Notes (Signed)
Brief Nutrition Note  Reason: Patient screened at nutrition risk for unintentional weight loss  Patient with past medical history including lap band procedure. Patient reported her appetite has been good. She reported good PO intake. PO intake documented 100% at meals. She reported a UBW of 275 lb. Current weight 274 lb. Patient is without unintentional weight loss of > 10 lb over 1 month. Patient not at nutrition risk at this time.   Wt Readings from Last 10 Encounters:  12/10/11 274 lb 0.5 oz (124.3 kg)  12/09/11 275 lb 14.4 oz (125.147 kg)  11/29/11 273 lb 11.2 oz (124.15 kg)  11/21/11 274 lb 7.6 oz (124.5 kg)  11/13/11 275 lb 6.4 oz (124.921 kg)  11/04/11 282 lb 6.4 oz (128.096 kg)  10/30/11 282 lb (127.914 kg)  10/18/11 281 lb 3.2 oz (127.551 kg)  10/18/11 281 lb 3.2 oz (127.551 kg)  10/14/11 281 lb 3.2 oz (127.551 kg)   RD available for nutrition needs.  Iven Finn [facility] [phone]

## 2011-12-10 NOTE — ED Notes (Signed)
Attempted to call report. Floor RN unable to accept report.  

## 2011-12-10 NOTE — Progress Notes (Signed)
Jamie [name]   DOB:11/30/1945   MV#:[id number]   MWU#:[id number]  Subjective: Jamie [name] was admitted last night with a sensation of chest tightness which developed after she received her chemotherapy; she called and we suggested lasix, which she took, with no improvement after one hour, so she presented to the ED and was admitted; she tells me that after 2 hours from the lasix "finally it worked," and after diuresing she felt better. Currently she denies chest pain or pressure, shortness of breath, cough, or pleurisy. Her husband is in the room as was initially Dr.Nishan.   Objective: middle-aged White womanexamined in bed Filed Vitals:   12/10/11 2016  BP: 141/80  Pulse: 94  Temp: 97.8 F (36.6 [name])  Resp: 19    Body mass index is 45.60 kg/(m^2).  Intake/Output Summary (Last 24 hours) at 12/10/11 2053 Last data filed at 12/10/11 1949  Gross per 24 hour  Intake    360 ml  Output    800 ml  Net   -440 ml     Sclerae unicteric  Oropharynx clear  No peripheral adenopathy  Lungs clear -- no rales or rhonchi, auscultated anterolaterally  Heart regular rate and rhythm, no murmur appreciated  Abdomen soft, NT, +BS  MSK no focal spinal tenderness, no peripheral edema  Neuro nonfocal   CBG (last 3)   Basename 12/10/11 1820 12/10/11 1503 12/10/11 1357  GLUCAP 394* 484* 546*     Labs:  Lab Results  Component Value Date   WBC 5.1 12/10/2011   HGB 13.8 12/10/2011   HCT 42.6 12/10/2011   MCV 87.3 12/10/2011   PLT 156 12/10/2011   NEUTROABS 4.3 12/10/2011     Lab 12/10/11 0145  NA 136  K 3.7  CL 100  CO2 21  GLUCOSE 421*  BUN 15  CREATININE 0.74  CALCIUM 9.1  MG --    Urine Studies No results found for this basename: UACOL:2,UAPR:2,USPG:2,UPH:2,UTP:2,UGL:2,UKET:2,UBIL:2,UHGB:2,UNIT:2,UROB:2,ULEU:2,UEPI:2,UWBC:2,URBC:2,UBAC:2,CAST:2,CRYS:2,UCOM:2,BILUA:2 in the last 72 hours     Studies:  Dg Chest 2 View  12/10/2011  *RADIOLOGY REPORT*  Clinical Data: Shortness of breath; history  of breast cancer.  CHEST - 2 VIEW  Comparison: Chest radiograph performed 12/02/2011  Findings: The lungs are well-aerated.  Vascular congestion is noted.  Mild left basilar airspace opacity likely reflects atelectasis.  There is no definite evidence for significant pulmonary edema.  There is no evidence of pleural effusion or pneumothorax.  The heart is borderline normal in size; the mediastinal contour is within normal limits.  No acute osseous abnormalities are seen.  A right-sided chest port is noted ending about the distal SVC. Bilateral breast implants are seen.  IMPRESSION: Vascular congestion noted; mild left basilar airspace opacity likely reflects atelectasis, though pneumonia could conceivably have a similar appearance.  Original Report Authenticated By: Tonia Ghent, M.D.   Ct Angio Chest W/cm &/or Wo Cm  12/10/2011  *RADIOLOGY REPORT*  Clinical Data: Bilateral mastectomy 2 weeks ago for breast cancer. Short of breath.  CT ANGIOGRAPHY CHEST  Technique:  Multidetector CT imaging of the chest using the standard protocol during bolus administration of intravenous contrast. Multiplanar reconstructed images including MIPs were obtained and reviewed to evaluate the vascular anatomy.  Contrast: OMNIPAQUE IOHEXOL 300 MG/ML  SOLN  Comparison: Chest x-ray 12/10/2011  Findings: Negative for pulmonary embolism.  No aortic aneurysm or dissection.  Mild atherosclerotic calcification of the coronary arteries.  No pericardial effusion.  Mild bibasilar atelectasis.  No significant pleural effusion. Negative for mass or pneumonia.  No adenopathy.  Bilateral breast reconstruction is noted.  Fatty infiltration of liver.  Gastric banding for obesity.  IMPRESSION: Negative for pulmonary embolism.  Mild bibasilar atelectasis.  Original Report Authenticated By: Camelia Phenes, M.D.    Assessment: 66 year-old [name], [place] woman  (1) s/p bilateral mastectomies and left axillary lymph node dissection 10/18/2011 for  a pT2 pN1, stage IIB invasive ductal carcinoma, grade 2, estrogen and progesterone receptor strongly positive, with no HER-2 amplification and an Mib-1 of 30%, currently day 2 cycle 1 of a truncated treatment with cytoxan and adriamycin (the adriamycin given by continues infusion over 72 hours, disconnected after about 6 hours)  (2) admitted with chest discomfort associated with fluid overload, with symptomatic relief from diuresis, CT angio negative for PE  Plan: cardiology workup is in progress but I do not believe the small amount of adriamycin the patient received could account for the patient's symptoms. Somehow the fluids she received with chemo "were too much," though why that should be is not clear (she had a recent echo in [place] w/o suggestion of CHF). In any case we have cancelled the adriamycin this cycle. I took the pump with the residual chemo back to our pharmacy, and am changing future doses to add lasix with each cycle. Jamie [name] already has a follow-up appointment with [place] May 13 and we will review chemo plans at that time.  Please let me know if I can be of further help. Will follow peripherally.  Jamie [name] 12/10/2011

## 2011-12-10 NOTE — Progress Notes (Addendum)
Subjective:   Chart reviewed. Says she feels 100% better. Indicates that her dyspnea is almost back to baseline. Chest pain improved. Of note, patient has had intermittent chest pain which is worse with movement, since her mastectomy and expander placement. She had shoulder pain post surgery which has improved after gabapentin.  Objective  Vital signs in last 24 hours: Filed Vitals:   12/10/11 0559 12/10/11 1610 12/10/11 0614 12/10/11 0647  BP:    131/73  Pulse:    81  Temp: 97.9 F (36.6 C)   97.5 F (36.4 C)  TempSrc: Oral   Oral  Resp:    19  Height:  5\' 5"  (1.651 m) 5\' 5"  (1.651 m) 5\' 5"  (1.651 m)  Weight:  125.147 kg (275 lb 14.4 oz) 123.378 kg (272 lb) 124.3 kg (274 lb 0.5 oz)  SpO2:    97%   Weight change:  No intake or output data in the 24 hours ending 12/10/11 1005  Physical Exam:  General Exam: Comfortable. Sitting up in bed eating breakfast. Respiratory System: Few basal crackles, right greater than the left. Rest of the lung fields are clear. No increased work of breathing. Porta-A-cath on right upper anterior chest. Surrounding area slightly tender but without acute signs. Cardiovascular System: First and second heart sounds heard. Regular rate and rhythm. No JVD/murmurs. 1+ pitting bilateral lower extremity edema.  Gastrointestinal System: Abdomen is non distended, soft and normal bowel sounds heard.  Central Nervous System: Alert and oriented. No focal neurological deficits. Extremities: Symmetrical 5 x 5 power.  Labs:  Basic Metabolic Panel:  Lab 12/10/11 9604  NA 136  K 3.7  CL 100  CO2 21  GLUCOSE 421*  BUN 15  CREATININE 0.74  CALCIUM 9.1  ALB --  PHOS --   Liver Function Tests: No results found for this basename: AST:3,ALT:3,ALKPHOS:3,BILITOT:3,PROT:3,ALBUMIN:3 in the last 168 hours No results found for this basename: LIPASE:3,AMYLASE:3 in the last 168 hours No results found for this basename: AMMONIA:3 in the last 168 hours CBC:  Lab 12/10/11  0145 12/09/11 1110  WBC 5.1 6.4  NEUTROABS 4.3 3.7  HGB 13.8 13.4  HCT 42.6 41.6  MCV 87.3 87.9  PLT 156 145   Cardiac Enzymes:  Lab 12/10/11 0805 12/10/11 0549 12/10/11 0145  CKTOTAL 93 -- --  CKMB 9.6* -- --  CKMBINDEX -- -- --  TROPONINI -- 2.31* 1.10*   CBG: No results found for this basename: GLUCAP:5 in the last 168 hours  Iron Studies: No results found for this basename: IRON,TIBC,TRANSFERRIN,FERRITIN in the last 72 hours Studies/Results: Dg Chest 2 View  12/10/2011  *RADIOLOGY REPORT*  Clinical Data: Shortness of breath; history of breast cancer.  CHEST - 2 VIEW  Comparison: Chest radiograph performed 12/02/2011  Findings: The lungs are well-aerated.  Vascular congestion is noted.  Mild left basilar airspace opacity likely reflects atelectasis.  There is no definite evidence for significant pulmonary edema.  There is no evidence of pleural effusion or pneumothorax.  The heart is borderline normal in size; the mediastinal contour is within normal limits.  No acute osseous abnormalities are seen.  A right-sided chest port is noted ending about the distal SVC. Bilateral breast implants are seen.  IMPRESSION: Vascular congestion noted; mild left basilar airspace opacity likely reflects atelectasis, though pneumonia could conceivably have a similar appearance.  Original Report Authenticated By: Tonia Ghent, M.D.   Medications:    . DISCONTD: heparin        . aspirin  325 mg  Oral Daily  . dexamethasone  8 mg Oral BID WC  . enoxaparin (LOVENOX) injection  40 mg Subcutaneous Q24H  . furosemide  40 mg Intravenous Q12H  . gabapentin  300 mg Oral TID  . loratadine  10 mg Oral Daily  . nitroGLYCERIN  0.5 inch Topical Q6H  . simvastatin  20 mg Oral q1800  . sodium chloride  3 mL Intravenous Q12H  . tiotropium  18 mcg Inhalation Daily  . DISCONTD: enoxaparin  40 mg Subcutaneous Q24H  . DISCONTD: enoxaparin  40 mg Subcutaneous Q24H  . DISCONTD: heparin  4,000 Units Intravenous  Once    I  have reviewed scheduled and prn medications.     Problem/Plan: Principal Problem:  *Elevated troponin Active Problems:  Breast cancer  CHF (congestive heart failure)  COPD (chronic obstructive pulmonary disease)  Hyperlipidemia  Obesity  1. Acute diastolic congestive heart failure, in context of recent chemotherapy/Adriamycin and peri chemo IVF: Clinically improved. [facility] cardiology consultation appreciated. Followup 2-D echocardiogram. Continue Lasix. Further evaluation and management as per cardiology. 2. Elevated troponin/type II non-ST elevation MI: Probably secondary to decompensated congestive heart failure. CPK MB is elevated but CK is normal. No acute EKG changes. Followup 2-D echo results. Management as per cardiology. 3. Chest pain: Patient has had some chest pain since her mastectomy. Cardiology has requested d-dimer and if positive will need CT angiogram of the chest to rule out pulmonary embolism. Improved. 4. Hyperglycemia; Says had DM 2 in past which resolved after Lap banding. Check hemoglobin A1c, discontinue Decadron (discussed with Dr. Darnelle Catalan) and place on sliding scale insulin. 5. Breast cancer: Dr. Darnelle Catalan has seen the patient in the hospital today. Discussed with him. He indicates that patient was on Decadron for nausea which could be discontinued given her hyperglycemia. He recommends Marinol 5 mg by mouth 3 times a day, if Zofran doesn't help. Did not start Reglan secondary to QTC of 497 ms. 6. COPD: Stable. 7. OSA: Patient indicates that this was mild and she was not recommended CPAP.  Discussed with patient and her daughter at the bedside and updated care.   Addendum: (11 am) Patient seems to have some worsening dyspnea at this time. Reviewed records and does not appear that patient has received any Lasix since admission. Discussed with nursing and ensure IV Lasix be started now. Changed Combivent nebulizations to albuterol nebulizations when  necessary. Continue home Spiriva.  Tammye Kahler 12/10/2011,10:05 AM  LOS: 1 day

## 2011-12-10 NOTE — Progress Notes (Signed)
Pt CBG 589.  Notified MD.  Received order to adm sliding scale insulin dose of Regular Novolog 15 units subcutaneous. Also notified MD of D dimer 1.47.  Will continue to monitor.

## 2011-12-10 NOTE — Progress Notes (Signed)
CBG is 435. Called on call physician for stat lab verification.

## 2011-12-10 NOTE — ED Provider Notes (Signed)
History     CSN: [id number]  Arrival date & time 12/09/11  2316   First MD Initiated Contact with Patient 12/10/11 0010      Chief Complaint  Patient presents with  . Shortness of Breath    (Consider location/radiation/quality/duration/timing/severity/associated sxs/prior treatment) HPI This is a 66 year old white female with a history of breast cancer status post bilateral mastectomy. She had her first chemotherapy treatment yesterday in the clinic here at [facility]. During treatment she developed mild dyspnea and chest pain. She was sent home with an Adriamycin infusion. Yesterday evening she developed worsening shortness of breath with sharp intermittent chest pains. She characterizes the symptoms as feeling as if she overexerted herself. She contacted Dr. Archie Balboa, the oncologist on call, and he died she take an additional 20 mg of Lasix. She took the Lasix but did not feel better an hour later so came to the ED per his instructions. Subsequently she has urinated 3 times and has had significant improvement in her symptomatology. Her chest pain was a 6/10 on arrival and as a 2/10 at the present time. She denies nausea, vomiting, fever, chills or diaphoresis.  Past Medical History  Diagnosis Date  . Left breast mass   . Asthma   . COPD (chronic obstructive pulmonary disease)   . Shortness of breath   . Sleep apnea     sleep study2-3 yrs ago lost weight afterward but gained back  . UTI (lower urinary tract infection)     freq-bladder implant -removed  . lt breast ca dx'd 08/2011  . Headache     hx migraines    Past Surgical History  Procedure Date  . Cesarean section 04/1976  . Abdominal hysterectomy 1977  . Laparoscopic gastric banding 2010  . Nasal sinus surgery   . Tonsillectomy   . Appendectomy   . Mastectomy w/ sentinel node biopsy 10/18/2011    Procedure: MASTECTOMY WITH SENTINEL LYMPH NODE BIOPSY;  Surgeon: Robyne Askew, MD;  Location: [facility] OR;  Service: General;   Laterality: Bilateral;  bilateral mastectomy and left sentinel node biopsy  . Axillary lymph node dissection 10/18/2011    Procedure: AXILLARY LYMPH NODE DISSECTION;  Surgeon: Robyne Askew, MD;  Location: [facility] OR;  Service: General;  Laterality: Left;  . Breast reconstruction 10/18/2011    Procedure: BREAST RECONSTRUCTION;  Surgeon: Wayland Denis, DO;  Location: [facility] OR;  Service: Plastics;  Laterality: Bilateral;   bilateral breast reconstruction with bilateral tissue expander and placement of flex hd.  . US echocardiography     at [facility]  . Portacath placement 12/02/2011    Procedure: INSERTION PORT-A-CATH;  Surgeon: Robyne Askew, MD;  [facility] OR;  Service: General;  Laterality: Right;    Family History  Problem Relation Age of Onset  . Anesthesia problems Neg Hx   . Hypotension Neg Hx   . Malignant hyperthermia Neg Hx   . Pseudochol deficiency Neg Hx     History  Substance Use Topics  . Smoking status: Former Smoker -- 2.0 packs/day    Quit date: 08/25/2006  . Smokeless tobacco: Never Used   Comment: social drinker 2-3 yr  . Alcohol Use: Yes     rarely    OB History    Grav Para Term Preterm Abortions TAB SAB Ect Mult Living                  Review of Systems  All other systems reviewed and are negative.  Allergies  Vicodin and Sulfa antibiotics  Home Medications   Current Outpatient Rx  Name Route Sig Dispense Refill  . ALBUTEROL SULFATE HFA 108 (90 BASE) MCG/ACT IN AERS Inhalation Inhale 2 puffs into the lungs every 6 (six) hours as needed. For wheezing    . FUROSEMIDE 20 MG PO TABS Oral Take 20 mg by mouth 2 (two) times daily.     Marland Kitchen GABAPENTIN 300 MG PO CAPS Oral Take 1 capsule (300 mg total) by mouth 3 (three) times daily. 60 capsule 3  . LIDOCAINE-PRILOCAINE 2.5-2.5 % EX CREA Topical Apply 1 application topically as needed. Port cath    . LORATADINE 10 MG PO TABS Oral Take 10 mg by mouth daily.    Marland Kitchen LORAZEPAM 0.5 MG PO TABS Oral Take 0.5 mg by  mouth at bedtime as needed.    . OXYCODONE HCL 5 MG PO TABS Oral Take 1 tablet (5 mg total) by mouth at bedtime as needed for pain. 30 tablet 0  . OXYCODONE-ACETAMINOPHEN 5-325 MG PO TABS Oral Take 1 tablet by mouth every 4 (four) hours as needed for pain. 50 tablet 0  . PROCHLORPERAZINE MALEATE 10 MG PO TABS Oral Take 10 mg by mouth every 6 (six) hours. Nausea    . SIMVASTATIN 20 MG PO TABS Oral Take 20 mg by mouth Daily.     Marland Kitchen TIOTROPIUM BROMIDE MONOHYDRATE 18 MCG IN CAPS Inhalation Place 18 mcg into inhaler and inhale daily.    . TRIAMCINOLONE ACETONIDE 0.1 % EX CREA Topical Apply 1 application topically 2 (two) times daily.     Marland Kitchen DEXAMETHASONE 4 MG PO TABS Oral Take 8 mg by mouth 2 (two) times daily with a meal.      BP 186/80  Pulse 112  Temp(Src) 97.8 F (36.6 C) (Oral)  Resp 24  SpO2 93%  Physical Exam General: Well-developed, well-nourished female in no acute distress; appearance consistent with age of record HENT: normocephalic, atraumatic Eyes: Normal appearance Neck: supple Heart: regular rate and rhythm; tachycardia Lungs: clear to auscultation bilaterally Chest: Port-A-Cath in right upper chest with Adriamycin infusing; bilateral mastectomy Abdomen: soft; obese; nontender; bowel sounds present Extremities: No deformity; full range of motion; pulses normal; trace edema of lower legs Neurologic: Awake, alert and oriented; motor function intact in all extremities and symmetric; no facial droop Skin: Warm and dry Psychiatric: Normal mood and affect    ED Course  Procedures (including critical care time)     MDM   Nursing notes and vitals signs, including pulse oximetry, reviewed.  Summary of this visit's results, reviewed by myself:  Labs:  Results for orders placed during the hospital encounter of 12/09/11  TROPONIN I      Component Value Range   Troponin I 1.10 (*) <0.30 (ng/mL)  CBC      Component Value Range   WBC 5.1  4.0 - 10.5 (K/uL)   RBC 4.88   3.87 - 5.11 (MIL/uL)   Hemoglobin 13.8  12.0 - 15.0 (g/dL)   HCT 16.1  09.6 - 04.5 (%)   MCV 87.3  78.0 - 100.0 (fL)   MCH 28.3  26.0 - 34.0 (pg)   MCHC 32.4  30.0 - 36.0 (g/dL)   RDW 40.9  81.1 - 91.4 (%)   Platelets 156  150 - 400 (K/uL)  DIFFERENTIAL      Component Value Range   Neutrophils Relative 84 (*) 43 - 77 (%)   Neutro Abs 4.3  1.7 - 7.7 (K/uL)  Lymphocytes Relative 14  12 - 46 (%)   Lymphs Abs 0.7  0.7 - 4.0 (K/uL)   Monocytes Relative 2 (*) 3 - 12 (%)   Monocytes Absolute 0.1  0.1 - 1.0 (K/uL)   Eosinophils Relative 0  0 - 5 (%)   Eosinophils Absolute 0.0  0.0 - 0.7 (K/uL)   Basophils Relative 0  0 - 1 (%)   Basophils Absolute 0.0  0.0 - 0.1 (K/uL)  BASIC METABOLIC PANEL      Component Value Range   Sodium 136  135 - 145 (mEq/L)   Potassium 3.7  3.5 - 5.1 (mEq/L)   Chloride 100  96 - 112 (mEq/L)   CO2 21  19 - 32 (mEq/L)   Glucose, Bld 421 (*) 70 - 99 (mg/dL)   BUN 15  6 - 23 (mg/dL)   Creatinine, Ser 4.09  0.50 - 1.10 (mg/dL)   Calcium 9.1  8.4 - 81.1 (mg/dL)   GFR calc non Af Amer 86 (*) >90 (mL/min)   GFR calc Af Amer >90  >90 (mL/min)  PRO B NATRIURETIC PEPTIDE      Component Value Range   Pro B Natriuretic peptide (BNP) 525.3 (*) 0 - 125 (pg/mL)    Imaging Studies: Dg Chest 2 View  12/10/2011  *RADIOLOGY REPORT*  Clinical Data: Shortness of breath; history of breast cancer.  CHEST - 2 VIEW  Comparison: Chest radiograph performed 12/02/2011  Findings: The lungs are well-aerated.  Vascular congestion is noted.  Mild left basilar airspace opacity likely reflects atelectasis.  There is no definite evidence for significant pulmonary edema.  There is no evidence of pleural effusion or pneumothorax.  The heart is borderline normal in size; the mediastinal contour is within normal limits.  No acute osseous abnormalities are seen.  A right-sided chest port is noted ending about the distal SVC. Bilateral breast implants are seen.  IMPRESSION: Vascular congestion noted;  mild left basilar airspace opacity likely reflects atelectasis, though pneumonia could conceivably have a similar appearance.  Original Report Authenticated By: Tonia Ghent, M.D.      EKG Interpretation:  Date & Time: 12/09/2011 23:42  Rate: 105   Rhythm: sinus tachycardia  QRS Axis: normal  Intervals: normal  ST/T Wave abnormalities: normal  Conduction Disutrbances:none  Narrative Interpretation: Abnormal R wave progression  Old EKG Reviewed: Rate is faster  3:49 AM [facility] hospitalist to admit. Discussed patient's care with Dr. Archie Balboa, the hematologist oncologist on call, who advises that Adriamycin given by slow infusion for a short period of time since yesterday should not cause an elevation in troponin. He will have the day hematology oncology team see the patient in consultation.      Hanley Seamen, MD 12/10/11 [id number]

## 2011-12-10 NOTE — H&P (Signed)
PCP:   Raynelle Jan., MD, MD  Oncologist: Dr. Darnelle Catalan  Plastic surgeon: Dr. Sherrie Mustache Surgeon: Dr. Carolynne Edouard  Chief Complaint:  Shortness of breath  HPI: This is a 66 year old female with history of left breast cancer, she's had bilateral mastectomies. Yesterday she started chemotherapy and was sent home with a Adriamycin pump - first dose. Patient states she's already started getting short of breath during chemotherapy center, she received 500 cc -1 L of fluids. However this continued and worsened while she was at home. She called oncologist on call who recommended her to take an extra dose of her Lasix and call back in an hour. She did this, she remained short of breath therefore she was sent to the ER. The patient does report bilateral chest pains during her chemotherapy, this worsened while she was a home. She's not clear pain is due to cardiac pain versus status post mastectomy pain.  Patient has chronic orthopnea she sleeps in a chair. She states she's been doing this since her bilateral mastectomy where she became fluid overloaded postoperatively. She also reports ongoing chest discomfort post mastectomy and with her spacer implant. She additionally does have some chronic shortness of breath with her COPD.  Review of Systems: Positives bolded   anorexia, fever, weight loss,, vision loss, decreased hearing, hoarseness, chest pain, syncope, dyspnea on exertion, peripheral edema, balance deficits, hemoptysis, abdominal pain, melena, hematochezia, severe indigestion/heartburn, hematuria, incontinence, genital sores, muscle weakness, suspicious skin lesions, transient blindness, difficulty walking, depression, unusual weight change, abnormal bleeding, enlarged lymph nodes, angioedema, and breast masses.  Past Medical History: Past Medical History  Diagnosis Date  . Left breast mass   . Asthma   . COPD (chronic obstructive pulmonary disease)   . Shortness of breath   . Sleep apnea     sleep  study2-3 yrs ago lost weight afterward but gained back  . UTI (lower urinary tract infection)     freq-bladder implant -removed  . lt breast ca dx'd 08/2011  . Headache     hx migraines   Past Surgical History  Procedure Date  . Cesarean section 04/1976  . Abdominal hysterectomy 1977  . Laparoscopic gastric banding 2010  . Nasal sinus surgery   . Tonsillectomy   . Appendectomy   . Mastectomy w/ sentinel node biopsy 10/18/2011    Procedure: MASTECTOMY WITH SENTINEL LYMPH NODE BIOPSY;  Surgeon: Robyne Askew, MD;  Location: [facility] OR;  Service: General;  Laterality: Bilateral;  bilateral mastectomy and left sentinel node biopsy  . Axillary lymph node dissection 10/18/2011    Procedure: AXILLARY LYMPH NODE DISSECTION;  Surgeon: Robyne Askew, MD;  Location: [facility] OR;  Service: General;  Laterality: Left;  . Breast reconstruction 10/18/2011    Procedure: BREAST RECONSTRUCTION;  Surgeon: Wayland Denis, DO;  Location: [facility] OR;  Service: Plastics;  Laterality: Bilateral;   bilateral breast reconstruction with bilateral tissue expander and placement of flex hd.  . US echocardiography     at [facility]  . Portacath placement 12/02/2011    Procedure: INSERTION PORT-A-CATH;  Surgeon: Robyne Askew, MD;  Location: [facility] OR;  Service: General;  Laterality: Right;    Medications: Prior to Admission medications   Medication Sig Start Date End Date Taking? Authorizing Provider  albuterol (PROVENTIL HFA;VENTOLIN HFA) 108 (90 BASE) MCG/ACT inhaler Inhale 2 puffs into the lungs every 6 (six) hours as needed. For wheezing   Yes Historical Provider, MD  furosemide (LASIX) 20 MG tablet Take 20 mg  by mouth 2 (two) times daily.    Yes Historical Provider, MD  gabapentin (NEURONTIN) 300 MG capsule Take 1 capsule (300 mg total) by mouth 3 (three) times daily. 10/30/11 10/29/12 Yes Lowella Dell, MD  lidocaine-prilocaine (EMLA) cream Apply 1 application topically as needed. Port cath 12/02/11  Yes Historical Provider,  MD  loratadine (CLARITIN) 10 MG tablet Take 10 mg by mouth daily.   Yes Historical Provider, MD  LORazepam (ATIVAN) 0.5 MG tablet Take 0.5 mg by mouth at bedtime as needed. 11/29/11  Yes Lowella Dell, MD  oxyCODONE (OXY IR/ROXICODONE) 5 MG immediate release tablet Take 1 tablet (5 mg total) by mouth at bedtime as needed for pain. 11/29/11 12/09/11 Yes Lowella Dell, MD  oxyCODONE-acetaminophen (ROXICET) 5-325 MG per tablet Take 1 tablet by mouth every 4 (four) hours as needed for pain. 12/02/11 12/12/11 Yes Caleen Essex III, MD  prochlorperazine (COMPAZINE) 10 MG tablet Take 10 mg by mouth every 6 (six) hours. Nausea 11/29/11  Yes Lowella Dell, MD  simvastatin (ZOCOR) 20 MG tablet Take 20 mg by mouth Daily.  08/23/11  Yes Historical Provider, MD  tiotropium (SPIRIVA) 18 MCG inhalation capsule Place 18 mcg into inhaler and inhale daily.   Yes Historical Provider, MD  triamcinolone cream (KENALOG) 0.1 % Apply 1 application topically 2 (two) times daily.  11/22/11  Yes Historical Provider, MD  dexamethasone (DECADRON) 4 MG tablet Take 8 mg by mouth 2 (two) times daily with a meal. 11/29/11   Lowella Dell, MD    Allergies:   Allergies  Allergen Reactions  . Vicodin (Hydrocodone-Acetaminophen) Other (See Comments)    syncope  . Sulfa Antibiotics Other (See Comments)    unknown    Social History:  reports that she quit smoking about 5 years ago. She has never used smokeless tobacco. She reports that she drinks alcohol. She reports that she does not use illicit drugs.  Family History: Family History  Problem Relation Age of Onset  . Anesthesia problems Neg Hx   . Hypotension Neg Hx   . Malignant hyperthermia Neg Hx   . Pseudochol deficiency Neg Hx     Physical Exam: Filed Vitals:   12/09/11 2318  BP: 186/80  Pulse: 112  Temp: 97.8 F (36.6 C)  TempSrc: Oral  Resp: 24  SpO2: 93%    General:  Alert and oriented times three, well developed and nourished, no acute  distress Eyes: PERRLA, pink conjunctiva, no scleral icterus ENT: Moist oral mucosa, neck supple, no thyromegaly Lungs: clear to ascultation, no wheeze, no crackles, no use of accessory muscles Cardiovascular: regular rate and rhythm, no regurgitation, no gallops, no murmurs. No carotid bruits, no JVD Abdomen: soft, positive BS, non-tender, obese abdomen, non-distended, not an acute abdomen GU: not examined Neuro: CN II - XII grossly intact, sensation intact Musculoskeletal: strength 5/5 all extremities, no clubbing, cyanosis or edema, Port-A-Cath right chest wall Skin: no rash, no subcutaneous crepitation, no decubitus Psych: appropriate patient   Labs on Admission:   Basename 12/10/11 0145  NA 136  K 3.7  CL 100  CO2 21  GLUCOSE 421*  BUN 15  CREATININE 0.74  CALCIUM 9.1  MG --  PHOS --   No results found for this basename: AST:2,ALT:2,ALKPHOS:2,BILITOT:2,PROT:2,ALBUMIN:2 in the last 72 hours No results found for this basename: LIPASE:2,AMYLASE:2 in the last 72 hours  Basename 12/10/11 0145 12/09/11 1110  WBC 5.1 6.4  NEUTROABS 4.3 3.7  HGB 13.8 13.4  HCT 42.6  41.6  MCV 87.3 87.9  PLT 156 145    Basename 12/10/11 0145  CKTOTAL --  CKMB --  CKMBINDEX --  TROPONINI 1.10*   No components found with this basename: POCBNP:3 No results found for this basename: DDIMER:2 in the last 72 hours No results found for this basename: HGBA1C:2 in the last 72 hours No results found for this basename: CHOL:2,HDL:2,LDLCALC:2,TRIG:2,CHOLHDL:2,LDLDIRECT:2 in the last 72 hours No results found for this basename: TSH,T4TOTAL,FREET3,T3FREE,THYROIDAB in the last 72 hours No results found for this basename: VITAMINB12:2,FOLATE:2,FERRITIN:2,TIBC:2,IRON:2,RETICCTPCT:2 in the last 72 hours  Micro Results: No results found for this or any previous visit (from the past 240 hour(s)).   Radiological Exams on Admission: Dg Chest 2 View  12/10/2011  *RADIOLOGY REPORT*  Clinical Data: Shortness  of breath; history of breast cancer.  CHEST - 2 VIEW  Comparison: Chest radiograph performed 12/02/2011  Findings: The lungs are well-aerated.  Vascular congestion is noted.  Mild left basilar airspace opacity likely reflects atelectasis.  There is no definite evidence for significant pulmonary edema.  There is no evidence of pleural effusion or pneumothorax.  The heart is borderline normal in size; the mediastinal contour is within normal limits.  No acute osseous abnormalities are seen.  A right-sided chest port is noted ending about the distal SVC. Bilateral breast implants are seen.  IMPRESSION: Vascular congestion noted; mild left basilar airspace opacity likely reflects atelectasis, though pneumonia could conceivably have a similar appearance.  Original Report Authenticated By: Tonia Ghent, M.D.    EKG: Normal sinus rhythm  Assessment/Plan Present on Admission:  .CHF (congestive heart failure) Chest pains Elevated troponin Patient receiving Adriamycin Patient admitted to telemetry Adriamycin discontinued. Echo ordered. Adriamycin can cause congestive heart failure and cardiomyopathy. This is patient's first dose. Cycle cardiac enzymes, nitro paste ordered Aspirin ordered and lipid panel. Heparin drip ordered. Consult cardiology if troponin does not decrease. .Breast cancer Status post mastectomy Consult patient's oncologist in a.m. COPD Dyslipidemia Morbid obesity Stable. resume home medications  Full code DVT prophylaxis not indicated  Team 2/Dr. Renold Genta, Oather Muilenburg 12/10/2011, 5:39 AM

## 2011-12-10 NOTE — Progress Notes (Signed)
*  PRELIMINARY RESULTS* Echocardiogram 2D Echocardiogram has been performed.  Jeryl Columbia R 12/10/2011, 9:30 AM

## 2011-12-10 NOTE — Progress Notes (Signed)
Patient ID: Jamie [name], female   DOB: 04/30/1946, 66 y.o.   MRN: [id number] Echo shows normal LV function but poor images and she would not be suitable for stress echo ECG has T wave changes in lead 3 and I dont think she could walk on treadmill very well.  D-dimer was elvated.  CTA pending  Annamarie Dawley myovue to R/O CAD  Will tentatively make NPO and order for Dr Elease Hashimoto to confimr on rounds in am pending results of CTA  Charlton Haws 5:07 PM 12/10/2011

## 2011-12-10 NOTE — Progress Notes (Signed)
   CARE MANAGEMENT NOTE 12/10/2011  Patient:  Jamie [name], Jamie [name]   Account Number:  [id number]  Date Initiated:  12/10/2011  Documentation initiated by:  [facility]  Subjective/Objective Assessment:   ADMITTED W/SOB.ELEVATED TROP.     Action/Plan:   FROM HOME   Anticipated DC Date:  12/13/2011   Anticipated DC Plan:  HOME/SELF CARE         Choice offered to / List presented to:             Status of service:  In process, will continue to follow Medicare Important Message given?   (If response is "NO", the following Medicare IM given date fields will be blank) Date Medicare IM given:   Date Additional Medicare IM given:    Discharge Disposition:    Per UR Regulation:  Reviewed for med. necessity/level of care/duration of stay  If discussed at Long Length of Stay Meetings, dates discussed:    Comments:  12/10/11 [facility] RN,BSN NCM [phone]

## 2011-12-10 NOTE — Progress Notes (Signed)
ANTICOAGULATION CONSULT NOTE - Initial Consult  Pharmacy Consult for Heparin Indication: ACS (+) Trop.   Allergies  Allergen Reactions  . Vicodin (Hydrocodone-Acetaminophen) Other (See Comments)    syncope  . Sulfa Antibiotics Other (See Comments)    unknown    Patient Measurements: Height: 5\' 5"  (165.1 cm) Weight: 272 lb (123.378 kg) IBW/kg (Calculated) : 57  Heparin Dosing Weight:   Vital Signs: Temp: 97.9 F (36.6 C) (05/07 0559) Temp src: Oral (05/07 0559) BP: 149/62 mmHg (05/07 0500) Pulse Rate: 80  (05/07 0500)  Labs:  Basename 12/10/11 0145 12/09/11 1110  HGB 13.8 13.4  HCT 42.6 41.6  PLT 156 145  APTT -- --  LABPROT -- --  INR -- --  HEPARINUNFRC -- --  CREATININE 0.74 --  CKTOTAL -- --  CKMB -- --  TROPONINI 1.10* --   Estimated Creatinine Clearance: 90.1 ml/min (by C-G formula based on Cr of 0.74).  Medical History: Past Medical History  Diagnosis Date  . Left breast mass   . Asthma   . COPD (chronic obstructive pulmonary disease)   . Shortness of breath   . Sleep apnea     sleep study2-3 yrs ago lost weight afterward but gained back  . UTI (lower urinary tract infection)     freq-bladder implant -removed  . lt breast ca dx'd 08/2011  . Headache     hx migraines    Medications:  Infusions:    . heparin      Assessment: Patient with (+) trop.  MD has given NTG and ASA.    Goal of Therapy:  Heparin level 0.3-0.7 units/ml   Plan:  Heparin bolus 4000 units and 1000 units/hr.  Heparin level in 8hr and daily with CBC.  Darlina Guys, Jacquenette Shone Crowford 12/10/2011,6:20 AM

## 2011-12-11 ENCOUNTER — Encounter (HOSPITAL_COMMUNITY)
Admission: EM | Disposition: A | Discharge status: Home / Self Care | Payer: Self-pay | Source: Home / Self Care | Attending: Surgery

## 2011-12-11 ENCOUNTER — Other Ambulatory Visit: Payer: Self-pay

## 2011-12-11 ENCOUNTER — Encounter (HOSPITAL_COMMUNITY): Payer: Self-pay | Admitting: *Deleted

## 2011-12-11 DIAGNOSIS — J438 Other emphysema: Secondary | ICD-10-CM

## 2011-12-11 DIAGNOSIS — I509 Heart failure, unspecified: Secondary | ICD-10-CM

## 2011-12-11 DIAGNOSIS — R079 Chest pain, unspecified: Secondary | ICD-10-CM

## 2011-12-11 DIAGNOSIS — R799 Abnormal finding of blood chemistry, unspecified: Secondary | ICD-10-CM

## 2011-12-11 DIAGNOSIS — I2510 Atherosclerotic heart disease of native coronary artery without angina pectoris: Secondary | ICD-10-CM

## 2011-12-11 DIAGNOSIS — C50919 Malignant neoplasm of unspecified site of unspecified female breast: Secondary | ICD-10-CM

## 2011-12-11 HISTORY — PX: LEFT HEART CATHETERIZATION WITH CORONARY ANGIOGRAM: SHX5451

## 2011-12-11 LAB — BASIC METABOLIC PANEL
CO2: 27 mEq/L (ref 19–32)
Chloride: 100 mEq/L (ref 96–112)
Sodium: 136 mEq/L (ref 135–145)

## 2011-12-11 LAB — GLUCOSE, CAPILLARY
Glucose-Capillary: 171 mg/dL — ABNORMAL HIGH (ref 70–99)
Glucose-Capillary: 104 mg/dL — ABNORMAL HIGH (ref 70–99)

## 2011-12-11 LAB — PROTIME-INR
INR: 1.02 (ref 0.00–1.49)
Prothrombin Time: 13.6 seconds (ref 11.6–15.2)

## 2011-12-11 LAB — CBC
HCT: 36.5 % (ref 36.0–46.0)
HCT: 37.4 % (ref 36.0–46.0)
Hemoglobin: 11.8 g/dL — ABNORMAL LOW (ref 12.0–15.0)
Hemoglobin: 12.2 g/dL (ref 12.0–15.0)
MCH: 28.6 pg (ref 26.0–34.0)
MCHC: 32.6 g/dL (ref 30.0–36.0)
MCV: 87.3 fL (ref 78.0–100.0)
MCV: 87.8 fL (ref 78.0–100.0)
RBC: 4.18 MIL/uL (ref 3.87–5.11)
RDW: 13.9 % (ref 11.5–15.5)
WBC: 9.9 10*3/uL (ref 4.0–10.5)

## 2011-12-11 LAB — CREATININE, SERUM: GFR calc non Af Amer: 90 mL/min — ABNORMAL LOW (ref >90)

## 2011-12-11 LAB — PRO B NATRIURETIC PEPTIDE: Pro B Natriuretic peptide (BNP): 1386 pg/mL — ABNORMAL HIGH (ref 0–125)

## 2011-12-11 LAB — MRSA PCR SCREENING: MRSA by PCR: POSITIVE — AB

## 2011-12-11 SURGERY — LEFT HEART CATHETERIZATION WITH CORONARY ANGIOGRAM
Anesthesia: LOCAL

## 2011-12-11 MED ORDER — HEPARIN (PORCINE) IN NACL 2-0.9 UNIT/ML-% IJ SOLN
INTRAMUSCULAR | Status: AC
  Filled 2011-12-11: qty 2000

## 2011-12-11 MED ORDER — SODIUM CHLORIDE 0.9 % IV SOLN: 250.0000 mL | INTRAVENOUS | Status: DC | PRN

## 2011-12-11 MED ORDER — ATORVASTATIN CALCIUM 80 MG PO TABS
80.0000 mg | ORAL_TABLET | Freq: Every day | ORAL | Status: DC
  Administered 2011-12-11 – 2011-12-12 (×2): 80 mg via ORAL
  Filled 2011-12-11 (×3): qty 1

## 2011-12-11 MED ORDER — HEPARIN SODIUM (PORCINE) 5000 UNIT/ML IJ SOLN
5000.0000 [IU] | Freq: Three times a day (TID) | INTRAMUSCULAR | Status: AC
  Administered 2011-12-11 – 2011-12-12 (×4): 5000 [IU] via SUBCUTANEOUS
  Filled 2011-12-11 (×5): qty 1

## 2011-12-11 MED ORDER — ASPIRIN 81 MG PO CHEW
81.0000 mg | CHEWABLE_TABLET | Freq: Every day | ORAL | Status: DC
  Administered 2011-12-12 – 2011-12-13 (×2): 81 mg via ORAL
  Filled 2011-12-11 (×2): qty 1

## 2011-12-11 MED ORDER — ONDANSETRON HCL 4 MG/2ML IJ SOLN: 4.0000 mg | Freq: Four times a day (QID) | INTRAMUSCULAR | Status: DC | PRN

## 2011-12-11 MED ORDER — ACETAMINOPHEN 325 MG PO TABS: 650.0000 mg | ORAL_TABLET | ORAL | Status: DC | PRN

## 2011-12-11 MED ORDER — FENTANYL CITRATE 0.05 MG/ML IJ SOLN
INTRAMUSCULAR | Status: AC
  Filled 2011-12-11: qty 2

## 2011-12-11 MED ORDER — SODIUM CHLORIDE 0.9 % IJ SOLN: 3.0000 mL | INTRAMUSCULAR | Status: DC | PRN

## 2011-12-11 MED ORDER — NITROGLYCERIN 0.2 MG/ML ON CALL CATH LAB
INTRAVENOUS | Status: AC
  Filled 2011-12-11: qty 1

## 2011-12-11 MED ORDER — MUPIROCIN 2 % EX OINT
1.0000 "application " | TOPICAL_OINTMENT | Freq: Two times a day (BID) | CUTANEOUS | Status: DC
  Administered 2011-12-11 – 2011-12-13 (×4): 1 via NASAL
  Filled 2011-12-11 (×2): qty 22

## 2011-12-11 MED ORDER — SODIUM CHLORIDE 0.9 % IJ SOLN: 3.0000 mL | Freq: Two times a day (BID) | INTRAMUSCULAR | Status: DC

## 2011-12-11 MED ORDER — CHLORHEXIDINE GLUCONATE CLOTH 2 % EX PADS: 6.0000 | MEDICATED_PAD | Freq: Every day | CUTANEOUS | Status: DC

## 2011-12-11 MED ORDER — POLYETHYLENE GLYCOL 3350 17 G PO PACK
17.0000 g | PACK | Freq: Every day | ORAL | Status: DC
  Administered 2011-12-11: 17 g via ORAL
  Filled 2011-12-11 (×3): qty 1

## 2011-12-11 MED ORDER — MIDAZOLAM HCL 2 MG/2ML IJ SOLN
INTRAMUSCULAR | Status: AC
  Filled 2011-12-11: qty 2

## 2011-12-11 MED ORDER — ASPIRIN 81 MG PO CHEW
324.0000 mg | CHEWABLE_TABLET | ORAL | Status: AC
  Administered 2011-12-11: 324 mg via ORAL
  Filled 2011-12-11: qty 4

## 2011-12-11 MED ORDER — SODIUM CHLORIDE 0.9 % IV SOLN
INTRAVENOUS | Status: DC
  Administered 2011-12-11: 09:00:00 via INTRAVENOUS

## 2011-12-11 MED ORDER — DIAZEPAM 5 MG PO TABS
5.0000 mg | ORAL_TABLET | ORAL | Status: AC
  Administered 2011-12-11: 5 mg via ORAL
  Filled 2011-12-11: qty 1

## 2011-12-11 MED ORDER — BISOPROLOL FUMARATE 5 MG PO TABS
2.5000 mg | ORAL_TABLET | Freq: Every day | ORAL | Status: DC
  Administered 2011-12-12: 5 mg via ORAL
  Administered 2011-12-13: 2.5 mg via ORAL
  Filled 2011-12-11 (×4): qty 0.5

## 2011-12-11 MED ORDER — LIDOCAINE HCL (PF) 1 % IJ SOLN
INTRAMUSCULAR | Status: AC
  Filled 2011-12-11: qty 30

## 2011-12-11 MED ORDER — SODIUM CHLORIDE 0.9 % IV SOLN: INTRAVENOUS | Status: AC

## 2011-12-11 MED ORDER — CHLORHEXIDINE GLUCONATE CLOTH 2 % EX PADS
6.0000 | MEDICATED_PAD | Freq: Every morning | CUTANEOUS | Status: DC
  Administered 2011-12-12 – 2011-12-13 (×2): 6 via TOPICAL

## 2011-12-11 MED ORDER — MUPIROCIN 2 % EX OINT: 1.0000 "application " | TOPICAL_OINTMENT | Freq: Two times a day (BID) | CUTANEOUS | Status: DC

## 2011-12-11 MED ORDER — ASPIRIN EC 325 MG PO TBEC: 325.0000 mg | DELAYED_RELEASE_TABLET | Freq: Every day | ORAL | Status: DC

## 2011-12-11 NOTE — Consult Note (Signed)
[address]            [place] [address]          [phone]       Reason for Consult: High grade left main and multi-vessel coronary disease s/p NSTEMI Referring Physician:  [name], MD  Jamie [name] is an 66 y.o. female.  HPI:   The patient is 66 year old woman with a prior history of smoking and COPD, hyperlipidemia, diabetes, obesity, and a family history of heart disease who underwent bilateral mastectomies with immediate reconstruction on 10/18/2011 for left breast cancer. She had a Port-A-Cath placed on 12/02/2011 and started an Adriamycin-based chemotherapy regimen on Monday of this week. She said that she felt pretty well afterwards and she went home but then developed acute shortness of breath while talking with her grandson, followed by development of a dull substernal chest pain radiating into her arms. She presented to [facility] long hospital and ruled in for a non-ST segment elevation MI. She did have return of her chest discomfort with development of severe aching in her teeth. She was transferred to [facility] and underwent cardiac catheterization showing 75% ostial left main coronary stenosis as well as 80% proximal LAD stenosis. Left circumflex was small and had no significant disease. The right coronary was occluded after a large acute marginal with filling of the distal vessel by collaterals from the LAD. Left ventricular function was normal.  Past Medical History  Diagnosis Date  . Left breast mass   . Asthma   . COPD (chronic obstructive pulmonary disease)   . Shortness of breath   . Sleep apnea     sleep study2-3 yrs ago lost weight afterward but gained back  . UTI (lower urinary tract infection)     freq-bladder implant -removed  . lt breast ca dx'd 08/2011  . Headache     hx migraines    Past Surgical History  Procedure Date  . Cesarean section 04/1976  . Abdominal hysterectomy 1977  . Laparoscopic gastric banding 2010    . Nasal sinus surgery   . Tonsillectomy   . Appendectomy   . Mastectomy w/ sentinel node biopsy 10/18/2011    Procedure: MASTECTOMY WITH SENTINEL LYMPH NODE BIOPSY;  Surgeon: Robyne Askew, MD;  Location: [facility] OR;  Service: General;  Laterality: Bilateral;  bilateral mastectomy and left sentinel node biopsy  . Axillary lymph node dissection 10/18/2011    Procedure: AXILLARY LYMPH NODE DISSECTION;  Surgeon: Robyne Askew, MD;  Location: [facility] OR;  Service: General;  Laterality: Left;  . Breast reconstruction 10/18/2011    Procedure: BREAST RECONSTRUCTION;  Surgeon: Wayland Denis, DO;  Location: [facility] OR;  Service: Plastics;  Laterality: Bilateral;   bilateral breast reconstruction with bilateral tissue expander and placement of flex hd.  . US echocardiography     at [facility]  . Portacath placement 12/02/2011    Procedure: INSERTION PORT-A-CATH;  Surgeon: Robyne Askew, MD;  Location: [facility] OR;  Service: General;  Laterality: Right;    Family History  Problem Relation Age of Onset  . Anesthesia problems Neg Hx   . Hypotension Neg Hx   . Malignant hyperthermia Neg Hx   . Pseudochol deficiency Neg Hx     Social History:  reports that she quit smoking about 5 years ago. She has never used smokeless tobacco. She reports that  she does not drink alcohol or use illicit drugs.  Allergies:  Allergies  Allergen Reactions  . Vicodin (Hydrocodone-Acetaminophen) Other (See Comments)    syncope  . Sulfa Antibiotics Other (See Comments)    unknown    Medications:  I have reviewed the patient's current medications. Prior to Admission:  Prescriptions prior to admission  Medication Sig Dispense Refill  . albuterol (PROVENTIL HFA;VENTOLIN HFA) 108 (90 BASE) MCG/ACT inhaler Inhale 2 puffs into the lungs every 6 (six) hours as needed. For wheezing      . furosemide (LASIX) 20 MG tablet Take 20 mg by mouth 2 (two) times daily.       Marland Kitchen gabapentin (NEURONTIN) 300 MG capsule Take 1 capsule (300 mg total)  by mouth 3 (three) times daily.  60 capsule  3  . lidocaine-prilocaine (EMLA) cream Apply 1 application topically as needed. Port cath      . loratadine (CLARITIN) 10 MG tablet Take 10 mg by mouth daily.      Marland Kitchen LORazepam (ATIVAN) 0.5 MG tablet Take 0.5 mg by mouth at bedtime as needed.      Marland Kitchen oxyCODONE (OXY IR/ROXICODONE) 5 MG immediate release tablet Take 1 tablet (5 mg total) by mouth at bedtime as needed for pain.  30 tablet  0  . oxyCODONE-acetaminophen (ROXICET) 5-325 MG per tablet Take 1 tablet by mouth every 4 (four) hours as needed for pain.  50 tablet  0  . prochlorperazine (COMPAZINE) 10 MG tablet Take 10 mg by mouth every 6 (six) hours. Nausea      . simvastatin (ZOCOR) 20 MG tablet Take 20 mg by mouth Daily.       Marland Kitchen tiotropium (SPIRIVA) 18 MCG inhalation capsule Place 18 mcg into inhaler and inhale daily.      Marland Kitchen triamcinolone cream (KENALOG) 0.1 % Apply 1 application topically 2 (two) times daily.       Marland Kitchen dexamethasone (DECADRON) 4 MG tablet Take 8 mg by mouth 2 (two) times daily with a meal.       Scheduled:   . aspirin  324 mg Oral Pre-Cath  . aspirin  81 mg Oral Daily  . atorvastatin  80 mg Oral q1800  . bisoprolol  2.5 mg Oral Daily  . Chlorhexidine Gluconate Cloth  6 each Topical q morning - 10a  . diazepam  5 mg Oral On Call  . metoCLOPramide  10 mg Oral TID AC   And  . diphenhydrAMINE  12.5 mg Oral TID AC  . fentaNYL      . furosemide  40 mg Intravenous Q12H  . gabapentin  300 mg Oral TID  . heparin      . heparin  5,000 Units Subcutaneous Q8H  . insulin aspart  0-20 Units Subcutaneous TID WC  . insulin aspart  0-5 Units Subcutaneous QHS  . insulin aspart  8 Units Subcutaneous Once  . lidocaine      . loratadine  10 mg Oral Daily  . midazolam      . mupirocin ointment  1 application Nasal BID  . nitroGLYCERIN  0.5 inch Topical Q6H  . nitroGLYCERIN      . sodium chloride  500 mL Intravenous Once  . sodium chloride  3 mL Intravenous Q12H  . tiotropium  18 mcg  Inhalation Daily  . DISCONTD: aspirin EC  325 mg Oral Daily  . DISCONTD: aspirin EC  325 mg Oral Daily  . DISCONTD: Chlorhexidine Gluconate Cloth  6 each Topical Q0600  .  DISCONTD: Chlorhexidine Gluconate Cloth  6 each Topical Q0600  . DISCONTD: enoxaparin (LOVENOX) injection  60 mg Subcutaneous Q24H  . DISCONTD: insulin aspart  0-15 Units Subcutaneous TID WC  . DISCONTD: insulin aspart  0-5 Units Subcutaneous QHS  . DISCONTD: mupirocin ointment  1 application Nasal BID  . DISCONTD: simvastatin  20 mg Oral q1800  . DISCONTD: sodium chloride  3 mL Intravenous Q12H   Continuous:   . sodium chloride 10 mL/hr at 12/11/11 1805  . DISCONTD: sodium chloride 75 mL/hr at 12/11/11 1600   WUJ:[id number], acetaminophen, albuterol, alum & mag hydroxide-simeth, LORazepam, ondansetron (ZOFRAN) IV, oxyCODONE-acetaminophen, sodium chloride, sodium chloride, DISCONTD: sodium chloride, DISCONTD: acetaminophen, DISCONTD: sodium chloride Anti-infectives    None      Results for orders placed during the hospital encounter of 12/09/11 (from the past 48 hour(s))  TROPONIN I     Status: Abnormal   Collection Time   12/10/11  1:45 AM      Component Value Range Comment   Troponin I 1.10 (*) <0.30 (ng/mL)   CBC     Status: Normal   Collection Time   12/10/11  1:45 AM      Component Value Range Comment   WBC 5.1  4.0 - 10.5 (K/uL)    RBC 4.88  3.87 - 5.11 (MIL/uL)    Hemoglobin 13.8  12.0 - 15.0 (g/dL)    HCT 13.0  86.5 - 78.4 (%)    MCV 87.3  78.0 - 100.0 (fL)    MCH 28.3  26.0 - 34.0 (pg)    MCHC 32.4  30.0 - 36.0 (g/dL)    RDW 69.6  29.5 - 28.4 (%)    Platelets 156  150 - 400 (K/uL)   DIFFERENTIAL     Status: Abnormal   Collection Time   12/10/11  1:45 AM      Component Value Range Comment   Neutrophils Relative 84 (*) 43 - 77 (%)    Neutro Abs 4.3  1.7 - 7.7 (K/uL)    Lymphocytes Relative 14  12 - 46 (%)    Lymphs Abs 0.7  0.7 - 4.0 (K/uL)    Monocytes Relative 2 (*) 3 - 12 (%)    Monocytes  Absolute 0.1  0.1 - 1.0 (K/uL)    Eosinophils Relative 0  0 - 5 (%)    Eosinophils Absolute 0.0  0.0 - 0.7 (K/uL)    Basophils Relative 0  0 - 1 (%)    Basophils Absolute 0.0  0.0 - 0.1 (K/uL)   BASIC METABOLIC PANEL     Status: Abnormal   Collection Time   12/10/11  1:45 AM      Component Value Range Comment   Sodium 136  135 - 145 (mEq/L)    Potassium 3.7  3.5 - 5.1 (mEq/L)    Chloride 100  96 - 112 (mEq/L)    CO2 21  19 - 32 (mEq/L)    Glucose, Bld 421 (*) 70 - 99 (mg/dL)    BUN 15  6 - 23 (mg/dL)    Creatinine, Ser 1.32  0.50 - 1.10 (mg/dL)    Calcium 9.1  8.4 - 10.5 (mg/dL)    GFR calc non Af Amer 86 (*) >90 (mL/min)    GFR calc Af Amer >90  >90 (mL/min)   PRO B NATRIURETIC PEPTIDE     Status: Abnormal   Collection Time   12/10/11  1:45 AM      Component Value Range Comment  Pro B Natriuretic peptide (BNP) 525.3 (*) 0 - 125 (pg/mL)   TROPONIN I     Status: Abnormal   Collection Time   12/10/11  5:49 AM      Component Value Range Comment   Troponin I 2.31 (*) <0.30 (ng/mL)   LIPID PANEL     Status: Abnormal   Collection Time   12/10/11  5:49 AM      Component Value Range Comment   Cholesterol 166  0 - 200 (mg/dL)    Triglycerides 85  <161 (mg/dL)    HDL 41  >09 (mg/dL)    Total CHOL/HDL Ratio 4.0      VLDL 17  0 - 40 (mg/dL)    LDL Cholesterol 604 (*) 0 - 99 (mg/dL)   HEMOGLOBIN V4U     Status: Abnormal   Collection Time   12/10/11  5:49 AM      Component Value Range Comment   Hemoglobin A1C 6.7 (*) <5.7 (%)    Mean Plasma Glucose 146 (*) <117 (mg/dL)   CK TOTAL AND CKMB     Status: Abnormal   Collection Time   12/10/11  8:05 AM      Component Value Range Comment   Total CK 93  7 - 177 (U/L)    CK, MB 9.6 (*) 0.3 - 4.0 (ng/mL)    Relative Index RELATIVE INDEX IS INVALID  0.0 - 2.5    D-DIMER, QUANTITATIVE     Status: Abnormal   Collection Time   12/10/11 10:30 AM      Component Value Range Comment   D-Dimer, Quant 1.47 (*) 0.00 - 0.48 (ug/mL-FEU)   TROPONIN I      Status: Abnormal   Collection Time   12/10/11 11:50 AM      Component Value Range Comment   Troponin I 1.38 (*) <0.30 (ng/mL)   CK TOTAL AND CKMB     Status: Abnormal   Collection Time   12/10/11 11:50 AM      Component Value Range Comment   Total CK 92  7 - 177 (U/L)    CK, MB 9.2 (*) 0.3 - 4.0 (ng/mL) CRITICAL VALUE NOTED.  VALUE IS CONSISTENT WITH PREVIOUSLY REPORTED AND CALLED VALUE.   Relative Index RELATIVE INDEX IS INVALID  0.0 - 2.5    GLUCOSE, CAPILLARY     Status: Abnormal   Collection Time   12/10/11 11:58 AM      Component Value Range Comment   Glucose-Capillary 589 (*) 70 - 99 (mg/dL)    Comment 1 Notify RN     GLUCOSE, CAPILLARY     Status: Abnormal   Collection Time   12/10/11  1:57 PM      Component Value Range Comment   Glucose-Capillary 546 (*) 70 - 99 (mg/dL)   GLUCOSE, CAPILLARY     Status: Abnormal   Collection Time   12/10/11  3:03 PM      Component Value Range Comment   Glucose-Capillary 484 (*) 70 - 99 (mg/dL)   GLUCOSE, CAPILLARY     Status: Abnormal   Collection Time   12/10/11  6:20 PM      Component Value Range Comment   Glucose-Capillary 394 (*) 70 - 99 (mg/dL)   TROPONIN I     Status: Abnormal   Collection Time   12/10/11  6:35 PM      Component Value Range Comment   Troponin I 1.21 (*) <0.30 (ng/mL)   CK TOTAL AND CKMB  Status: Abnormal   Collection Time   12/10/11  6:35 PM      Component Value Range Comment   Total CK 91  7 - 177 (U/L)    CK, MB 7.8 (*) 0.3 - 4.0 (ng/mL) CRITICAL VALUE NOTED.  VALUE IS CONSISTENT WITH PREVIOUSLY REPORTED AND CALLED VALUE.   Relative Index RELATIVE INDEX IS INVALID  0.0 - 2.5    GLUCOSE, CAPILLARY     Status: Abnormal   Collection Time   12/10/11  9:20 PM      Component Value Range Comment   Glucose-Capillary 435 (*) 70 - 99 (mg/dL)   BASIC METABOLIC PANEL     Status: Abnormal   Collection Time   12/10/11 10:40 PM      Component Value Range Comment   Sodium 136  135 - 145 (mEq/L)    Potassium 3.8  3.5 - 5.1 (mEq/L)     Chloride 100  96 - 112 (mEq/L)    CO2 27  19 - 32 (mEq/L)    Glucose, Bld 425 (*) 70 - 99 (mg/dL)    BUN 21  6 - 23 (mg/dL)    Creatinine, Ser 4.09  0.50 - 1.10 (mg/dL)    Calcium 8.6  8.4 - 10.5 (mg/dL)    GFR calc non Af Amer 66 (*) >90 (mL/min)    GFR calc Af Amer 77 (*) >90 (mL/min)   PRO B NATRIURETIC PEPTIDE     Status: Abnormal   Collection Time   12/11/11  4:05 AM      Component Value Range Comment   Pro B Natriuretic peptide (BNP) 1386.0 (*) 0 - 125 (pg/mL)   BASIC METABOLIC PANEL     Status: Abnormal   Collection Time   12/11/11  4:05 AM      Component Value Range Comment   Sodium 136  135 - 145 (mEq/L)    Potassium 3.6  3.5 - 5.1 (mEq/L)    Chloride 100  96 - 112 (mEq/L)    CO2 27  19 - 32 (mEq/L)    Glucose, Bld 277 (*) 70 - 99 (mg/dL)    BUN 22  6 - 23 (mg/dL)    Creatinine, Ser 8.11  0.50 - 1.10 (mg/dL)    Calcium 8.9  8.4 - 10.5 (mg/dL)    GFR calc non Af Amer 86 (*) >90 (mL/min)    GFR calc Af Amer >90  >90 (mL/min)   CBC     Status: Abnormal   Collection Time   12/11/11  4:05 AM      Component Value Range Comment   WBC 9.9  4.0 - 10.5 (K/uL)    RBC 4.18  3.87 - 5.11 (MIL/uL)    Hemoglobin 11.8 (*) 12.0 - 15.0 (g/dL)    HCT 91.4  78.2 - 95.6 (%)    MCV 87.3  78.0 - 100.0 (fL)    MCH 28.2  26.0 - 34.0 (pg)    MCHC 32.3  30.0 - 36.0 (g/dL)    RDW 21.3  08.6 - 57.8 (%)    Platelets 162  150 - 400 (K/uL)   GLUCOSE, CAPILLARY     Status: Abnormal   Collection Time   12/11/11  4:41 AM      Component Value Range Comment   Glucose-Capillary 269 (*) 70 - 99 (mg/dL)   GLUCOSE, CAPILLARY     Status: Abnormal   Collection Time   12/11/11  7:31 AM      Component Value  Range Comment   Glucose-Capillary 210 (*) 70 - 99 (mg/dL)    Comment 1 Notify RN      Comment 2 Documented in Chart     PROTIME-INR     Status: Normal   Collection Time   12/11/11  9:20 AM      Component Value Range Comment   Prothrombin Time 13.6  11.6 - 15.2 (seconds)    INR 1.02  0.00 - 1.49      GLUCOSE, CAPILLARY     Status: Abnormal   Collection Time   12/11/11 11:45 AM      Component Value Range Comment   Glucose-Capillary 171 (*) 70 - 99 (mg/dL)    Comment 1 Notify RN      Comment 2 Documented in Chart     POCT ACTIVATED CLOTTING TIME     Status: Normal   Collection Time   12/11/11  2:15 PM      Component Value Range Comment   Activated Clotting Time 111     GLUCOSE, CAPILLARY     Status: Abnormal   Collection Time   12/11/11  3:41 PM      Component Value Range Comment   Glucose-Capillary 104 (*) 70 - 99 (mg/dL)   MRSA PCR SCREENING     Status: Abnormal   Collection Time   12/11/11  4:06 PM      Component Value Range Comment   MRSA by PCR POSITIVE (*) NEGATIVE    CBC     Status: Abnormal   Collection Time   12/11/11  4:24 PM      Component Value Range Comment   WBC 7.7  4.0 - 10.5 (K/uL)    RBC 4.26  3.87 - 5.11 (MIL/uL)    Hemoglobin 12.2  12.0 - 15.0 (g/dL)    HCT 16.1  09.6 - 04.5 (%)    MCV 87.8  78.0 - 100.0 (fL)    MCH 28.6  26.0 - 34.0 (pg)    MCHC 32.6  30.0 - 36.0 (g/dL)    RDW 40.9  81.1 - 91.4 (%)    Platelets 138 (*) 150 - 400 (K/uL)   CREATININE, SERUM     Status: Abnormal   Collection Time   12/11/11  4:24 PM      Component Value Range Comment   Creatinine, Ser 0.65  0.50 - 1.10 (mg/dL)    GFR calc non Af Amer 90 (*) >90 (mL/min)    GFR calc Af Amer >90  >90 (mL/min)     Dg Chest 2 View  12/10/2011  *RADIOLOGY REPORT*  Clinical Data: Shortness of breath; history of breast cancer.  CHEST - 2 VIEW  Comparison: Chest radiograph performed 12/02/2011  Findings: The lungs are well-aerated.  Vascular congestion is noted.  Mild left basilar airspace opacity likely reflects atelectasis.  There is no definite evidence for significant pulmonary edema.  There is no evidence of pleural effusion or pneumothorax.  The heart is borderline normal in size; the mediastinal contour is within normal limits.  No acute osseous abnormalities are seen.  A right-sided chest port is  noted ending about the distal SVC. Bilateral breast implants are seen.  IMPRESSION: Vascular congestion noted; mild left basilar airspace opacity likely reflects atelectasis, though pneumonia could conceivably have a similar appearance.  Original Report Authenticated By: Tonia Ghent, M.D.   Ct Angio Chest W/cm &/or Wo Cm  12/10/2011  *RADIOLOGY REPORT*  Clinical Data: Bilateral mastectomy 2 weeks ago for breast cancer. Short  of breath.  CT ANGIOGRAPHY CHEST  Technique:  Multidetector CT imaging of the chest using the standard protocol during bolus administration of intravenous contrast. Multiplanar reconstructed images including MIPs were obtained and reviewed to evaluate the vascular anatomy.  Contrast: OMNIPAQUE IOHEXOL 300 MG/ML  SOLN  Comparison: Chest x-ray 12/10/2011  Findings: Negative for pulmonary embolism.  No aortic aneurysm or dissection.  Mild atherosclerotic calcification of the coronary arteries.  No pericardial effusion.  Mild bibasilar atelectasis.  No significant pleural effusion. Negative for mass or pneumonia.  No adenopathy.  Bilateral breast reconstruction is noted.  Fatty infiltration of liver.  Gastric banding for obesity.  IMPRESSION: Negative for pulmonary embolism.  Mild bibasilar atelectasis.  Original Report Authenticated By: Camelia Phenes, M.D.    Review of Systems  Constitutional: Positive for malaise/fatigue and diaphoresis. Negative for weight loss.  HENT: Negative.   Eyes: Negative.   Respiratory: Positive for shortness of breath. Negative for cough, hemoptysis, sputum production and wheezing.   Cardiovascular: Positive for chest pain, palpitations and orthopnea. Negative for leg swelling and PND.  Gastrointestinal: Negative.   Genitourinary: Negative.   Musculoskeletal: Negative.   Skin: Negative.   Neurological: Negative.  Negative for weakness.  Endo/Heme/Allergies: Negative.   Psychiatric/Behavioral: Negative.    Blood pressure 156/75, pulse 77,  temperature 98.1 F (36.7 C), temperature source Oral, resp. rate 18, height 5\' 5"  (1.651 m), weight 123.9 kg (273 lb 2.4 oz), SpO2 99.00%. Physical Exam  Constitutional: She is oriented to person, place, and time. She appears well-developed and well-nourished.  HENT:  Head: Normocephalic and atraumatic.  Mouth/Throat: Oropharynx is clear and moist.  Eyes: Conjunctivae and EOM are normal. Pupils are equal, round, and reactive to light.  Neck: No JVD present. No thyromegaly present.  Cardiovascular: Normal rate, regular rhythm, normal heart sounds and intact distal pulses.  Exam reveals no gallop and no friction rub.   No murmur heard. Respiratory: Effort normal and breath sounds normal. No respiratory distress. She has no wheezes. She has no rales. She exhibits tenderness.       Mastectomy scars healing well with tissue expanders in place.  Porta cath in right anterior chest wall.  GI: Soft. Bowel sounds are normal. She exhibits no distension and no mass. There is no tenderness.  Musculoskeletal: She exhibits no edema.  Lymphadenopathy:    She has no cervical adenopathy.  Neurological: She is alert and oriented to person, place, and time. She has normal strength. No cranial nerve deficit or sensory deficit.  Skin: Skin is warm and dry.  Psychiatric: She has a normal mood and affect.   Cardiac Cath:   Procedural Findings: Hemodynamics:  AO 147/76 LV 154/26              Coronary angiography: Coronary dominance: right  Left mainstem: Short vessel.  There was waveform damping as well as chest pain with engagement of the left main.  There was a 75% ostial left main stenosis.    Left anterior descending (LAD): 80% focal proximal LAD stenosis at D1.    Left circumflex (LCx): Relatively small LCx system mainly consisting of a moderate-sized obtuse marginal.  No significant disease.    Right coronary artery (RCA): The mid RCA is totally occluded after a large acute marginal.  There  are collaterals to the PDA from the LAD.    Left ventriculography: Left ventricular systolic function is normal, LVEF is estimated at 55%, with basal inferior hypokinesis.    Final Conclusions:  Total  occlusion of the mid RCA with collaterals from the LAD.  75% ostial LM stenosis with damping and chest pain upon vessel engagement.  80% focal proximal LAD stenosis.  LVEDP moderately elevated.    Recommendations:  Difficult situation.  Patient has breast cancer s/p mastectomy and just started chemotherapy.  Ideally, her coronaries would be revascularized surgically.   LM PCI would be feasible also.  The LCx would be jailed given the short left main but is a relatively small vessel.  Stent thrombosis would be catastrophic given collateralization of the right so would need to consider opening chronic total occlusion of RCA.   I talked to Dr. Darnelle Catalan about her cancer treatment and prognosis.  She has a good chance for cure.  It would be feasible to delay her chemotherapy for her to undergo CABG and take a hormonal agent such as Femara in the meantime.  I think CABG is going to be best choice for treatment with delay in chemotherapy.  Will consult CVTS.    Jamie [name]   Assessment/Plan:  She has high-grade left main and two-vessel coronary disease involving the LAD and right coronary territories. I think coronary bypass graft surgery is the best treatment for this patient. She has potentially curable breast cancer. I think that trying to stent her left main would be very risky. I discussed the operative procedure with the patient and family including alternatives, benefits and risks; including but not limited to bleeding, blood transfusion, infection, stroke, myocardial infarction, graft failure, heart block requiring a permanent pacemaker, organ dysfunction, and death.  Jamie [name] understands and agrees to proceed.  We will schedule surgery for Friday afternoon.   Jamie [name] 12/11/2011, 7:35 PM

## 2011-12-11 NOTE — H&P (View-Only) (Signed)
PROGRESS NOTE  Subjective:   Jamie [name] had an episode of CP / pressure with bilateral arm pain associated with severe dyspnea.  Cardiac enzymes are positive.  She feels better this am   Objective:    Vital Signs:   Temp:  [97.6 F (36.4 C)-97.8 F (36.6 C)] 97.6 F (36.4 C) (05/08 0434) Pulse Rate:  [63-106] 63  (05/08 0434) Resp:  [18-24] 18  (05/08 0434) BP: (107-180)/(60-92) 107/60 mmHg (05/08 0434) SpO2:  [94 %-96 %] 94 % (05/08 0434)      24-hour weight change: Weight change:   Weight trends: Filed Weights   12/10/11 0613 12/10/11 0614 12/10/11 0647  Weight: 275 lb 14.4 oz (125.147 kg) 272 lb (123.378 kg) 274 lb 0.5 oz (124.3 kg)    Intake/Output:  05/07 0701 - 05/08 0700 In: 360 [P.O.:360] Out: 1300 [Urine:1300]     Physical Exam: BP 107/60  Pulse 63  Temp(Src) 97.6 F (36.4 C) (Oral)  Resp 18  Ht 5\' 5"  (1.651 m)  Wt 274 lb 0.5 oz (124.3 kg)  BMI 45.60 kg/m2  SpO2 94%  General: Vital signs reviewed and noted. Well-developed, well-nourished, in no acute distress; alert, appropriate and cooperative throughout examination.  Head: Normocephalic, atraumatic.  Eyes: conjunctivae/corneas clear. PERRL, EOM's intact. Fundi benign.  Throat: Oropharynx nonerythematous, no exudate appreciated.   Neck: Supple. Normal carotids. No JVD  Lungs:  Clear bilaterally to auscultation without wheezes, rales, or rhonchi. S/p mastectomies  Heart: Regular rate,  With normal  S1 S2. No murmurs, rubs, or gallops   Abdomen:  Soft, non-tender, non-distended with normoactive bowel sounds. No hepatomegaly. No rebound/guarding. No abdominal masses.  Extremities: No edema.  Distal pedal pulses are 2+ and equal bilaterally.  Neurologic: A&O X3, CN II - XII are grossly intact. Motor strength is 5/5 in the all 4 extremities.  Psych: Responds to questions appropriately with normal affect.    Labs: BMET:  Basename 12/11/11 0405 12/10/11 2240  NA 136 136  K 3.6 3.8  CL 100 100  CO2  27 27  GLUCOSE 277* 425*  BUN 22 21  CREATININE 0.73 0.88  CALCIUM 8.9 8.6  MG -- --  PHOS -- --    Liver function tests: No results found for this basename: AST:2,ALT:2,ALKPHOS:2,BILITOT:2,PROT:2,ALBUMIN:2 in the last 72 hours No results found for this basename: LIPASE:2,AMYLASE:2 in the last 72 hours  CBC:  Basename 12/11/11 0405 12/10/11 0145  WBC 9.9 5.1  NEUTROABS -- 4.3  HGB 11.8* 13.8  HCT 36.5 42.6  MCV 87.3 87.3  PLT 162 156    Cardiac Enzymes:  Basename 12/10/11 1835 12/10/11 1150 12/10/11 0805 12/10/11 0549 12/10/11 0145  CKTOTAL 91 92 93 -- --  CKMB 7.8* 9.2* 9.6* -- --  TROPONINI 1.21* 1.38* -- 2.31* 1.10*    Coagulation Studies: No results found for this basename: LABPROT:5,INR:5 in the last 72 hours  Other: No components found with this basename: POCBNP:3  Basename 12/10/11 1030  DDIMER 1.47*    Basename 12/10/11 0549  HGBA1C 6.7*    Basename 12/10/11 0549  CHOL 166  HDL 41  LDLCALC 108*  TRIG 85  CHOLHDL 4.0   No results found for this basename: TSH,T4TOTAL,FREET3,T3FREE,THYROIDAB in the last 72 hours No results found for this basename: VITAMINB12,FOLATE,FERRITIN,TIBC,IRON,RETICCTPCT in the last 72 hours    Tele:  nsr  Medications:    Infusions:    Scheduled Medications:    . aspirin EC  325 mg Oral Daily  . metoCLOPramide  10 mg  Oral TID AC   And  . diphenhydrAMINE  12.5 mg Oral TID AC  . enoxaparin (LOVENOX) injection  60 mg Subcutaneous Q24H  . furosemide  40 mg Intravenous Q12H  . gabapentin  300 mg Oral TID  . insulin aspart  0-20 Units Subcutaneous TID WC  . insulin aspart  0-5 Units Subcutaneous QHS  . insulin aspart  8 Units Subcutaneous Once  . insulin glargine  10 Units Subcutaneous Once  . loratadine  10 mg Oral Daily  . nitroGLYCERIN  0.5 inch Topical Q6H  . simvastatin  20 mg Oral q1800  . sodium chloride  500 mL Intravenous Once  . sodium chloride  3 mL Intravenous Q12H  . tiotropium  18 mcg Inhalation  Daily  . DISCONTD: aspirin  325 mg Oral Daily  . DISCONTD: dexamethasone  8 mg Oral BID WC  . DISCONTD: enoxaparin (LOVENOX) injection  40 mg Subcutaneous Q24H  . DISCONTD: insulin aspart  0-15 Units Subcutaneous TID WC  . DISCONTD: insulin aspart  0-5 Units Subcutaneous QHS    Assessment/ Plan:    1. Chest pain:  I suspect this was due to CAD.  I have recommended cath.  Discussed risks, benefits, options with patient.   She understands and agrees to proceed.  Disposition: to [facility] later today for cath. Length of Stay: 2  Alvia Grove., MD, [facility] 12/11/2011, 7:23 AM

## 2011-12-11 NOTE — Progress Notes (Signed)
Subjective: Had some CP/arm pain earlier this am. SOB improved. She is nervous about going for a cath today. Multiple family members present (husband, daughter, sister) updated on plan of care.  Objective: Vital signs in last 24 hours: Temp:  [97.6 F (36.4 C)-97.8 F (36.6 C)] 97.6 F (36.4 C) (05/08 0434) Pulse Rate:  [63-106] 63  (05/08 0434) Resp:  [18-24] 18  (05/08 0434) BP: (107-180)/(60-92) 107/60 mmHg (05/08 0434) SpO2:  [94 %-95 %] 94 % (05/08 0434) Weight change:     Intake/Output from previous day: 05/07 0701 - 05/08 0700 In: 372 [P.O.:360; IV Piggyback:12] Out: 1750 [Urine:1750] Total I/O In: 690 [P.O.:690] Out: 575 [Urine:575]   Physical Exam: General: Alert, awake, oriented x3, in no acute distress, pleasant. HEENT: No bruits, no goiter. Heart: Regular rate and rhythm, without murmurs, rubs, gallops. Lungs: Clear to auscultation bilaterally. Abdomen: Soft, nontender, nondistended, positive bowel sounds. Extremities: 1+ edema, positive pedal pulses. Neuro: Grossly intact, nonfocal.    Lab Results: Basic Metabolic Panel:  Basename 12/11/11 0405 12/10/11 2240  NA 136 136  K 3.6 3.8  CL 100 100  CO2 27 27  GLUCOSE 277* 425*  BUN 22 21  CREATININE 0.73 0.88  CALCIUM 8.9 8.6  MG -- --  PHOS -- --   CBC:  Basename 12/11/11 0405 12/10/11 0145  WBC 9.9 5.1  NEUTROABS -- 4.3  HGB 11.8* 13.8  HCT 36.5 42.6  MCV 87.3 87.3  PLT 162 156   Cardiac Enzymes:  Basename 12/10/11 1835 12/10/11 1150 12/10/11 0805 12/10/11 0549  CKTOTAL 91 92 93 --  CKMB 7.8* 9.2* 9.6* --  CKMBINDEX -- -- -- --  TROPONINI 1.21* 1.38* -- 2.31*   BNP:  Basename 12/11/11 0405 12/10/11 0145  PROBNP 1386.0* 525.3*   D-Dimer:  Basename 12/10/11 1030  DDIMER 1.47*   CBG:  Basename 12/11/11 0731 12/11/11 0441 12/10/11 2120 12/10/11 1820 12/10/11 1503 12/10/11 1357  GLUCAP 210* 269* 435* 394* 484* 546*   Hemoglobin A1C:  Basename 12/10/11 0549  HGBA1C 6.7*    Fasting Lipid Panel:  Basename 12/10/11 0549  CHOL 166  HDL 41  LDLCALC 108*  TRIG 85  CHOLHDL 4.0  LDLDIRECT --   Coagulation:  Basename 12/11/11 0920  LABPROT 13.6  INR 1.02    Studies/Results: Dg Chest 2 View  12/10/2011  *RADIOLOGY REPORT*  Clinical Data: Shortness of breath; history of breast cancer.  CHEST - 2 VIEW  Comparison: Chest radiograph performed 12/02/2011  Findings: The lungs are well-aerated.  Vascular congestion is noted.  Mild left basilar airspace opacity likely reflects atelectasis.  There is no definite evidence for significant pulmonary edema.  There is no evidence of pleural effusion or pneumothorax.  The heart is borderline normal in size; the mediastinal contour is within normal limits.  No acute osseous abnormalities are seen.  A right-sided chest port is noted ending about the distal SVC. Bilateral breast implants are seen.  IMPRESSION: Vascular congestion noted; mild left basilar airspace opacity likely reflects atelectasis, though pneumonia could conceivably have a similar appearance.  Original Report Authenticated By: Tonia Ghent, M.D.   Ct Angio Chest W/cm &/or Wo Cm  12/10/2011  *RADIOLOGY REPORT*  Clinical Data: Bilateral mastectomy 2 weeks ago for breast cancer. Short of breath.  CT ANGIOGRAPHY CHEST  Technique:  Multidetector CT imaging of the chest using the standard protocol during bolus administration of intravenous contrast. Multiplanar reconstructed images including MIPs were obtained and reviewed to evaluate the vascular anatomy.  Contrast:  OMNIPAQUE IOHEXOL 300 MG/ML  SOLN  Comparison: Chest x-ray 12/10/2011  Findings: Negative for pulmonary embolism.  No aortic aneurysm or dissection.  Mild atherosclerotic calcification of the coronary arteries.  No pericardial effusion.  Mild bibasilar atelectasis.  No significant pleural effusion. Negative for mass or pneumonia.  No adenopathy.  Bilateral breast reconstruction is noted.  Fatty infiltration  of liver.  Gastric banding for obesity.  IMPRESSION: Negative for pulmonary embolism.  Mild bibasilar atelectasis.  Original Report Authenticated By: Camelia Phenes, M.D.    Medications: Scheduled Meds:   . aspirin  324 mg Oral Pre-Cath  . aspirin EC  325 mg Oral Daily  . diazepam  5 mg Oral On Call  . metoCLOPramide  10 mg Oral TID AC   And  . diphenhydrAMINE  12.5 mg Oral TID AC  . enoxaparin (LOVENOX) injection  60 mg Subcutaneous Q24H  . furosemide  40 mg Intravenous Q12H  . gabapentin  300 mg Oral TID  . insulin aspart  0-20 Units Subcutaneous TID WC  . insulin aspart  0-5 Units Subcutaneous QHS  . insulin aspart  8 Units Subcutaneous Once  . insulin glargine  10 Units Subcutaneous Once  . loratadine  10 mg Oral Daily  . nitroGLYCERIN  0.5 inch Topical Q6H  . simvastatin  20 mg Oral q1800  . sodium chloride  500 mL Intravenous Once  . sodium chloride  3 mL Intravenous Q12H  . sodium chloride  3 mL Intravenous Q12H  . tiotropium  18 mcg Inhalation Daily  . DISCONTD: aspirin EC  325 mg Oral Daily  . DISCONTD: aspirin  325 mg Oral Daily  . DISCONTD: insulin aspart  0-15 Units Subcutaneous TID WC  . DISCONTD: insulin aspart  0-5 Units Subcutaneous QHS   Continuous Infusions:   . sodium chloride 75 mL/hr at 12/11/11 0901   PRN Meds:.sodium chloride, acetaminophen, acetaminophen, albuterol, alum & mag hydroxide-simeth, iohexol, LORazepam, oxyCODONE-acetaminophen, sodium chloride, sodium chloride, sodium chloride  Assessment/Plan:  Principal Problem:  *Elevated troponin Active Problems:  Breast cancer  CHF (congestive heart failure)  COPD (chronic obstructive pulmonary disease)  Hyperlipidemia  Obesity   #1 Chest Pain: for cath today with evidence of elevated enzymes and CT angio negative for PE, need to r/o CAD. ECHO was poor quality but EF was preserved at 60%.  #2 NSTEMI: See above. For cath today.  #3 Breast cancer: Per Dr. Darnelle Catalan (onc).  #4 Acute diastolic  CHF: for cath today. Neg 1.2 L since admission. Lasix 40 BID.    LOS: 2 days   HERNANDEZ ACOSTA,Wasif Simonich [facility] Pager: [phone] 12/11/2011, 11:12 AM

## 2011-12-11 NOTE — Progress Notes (Signed)
Jamie [name]   DOB:[birth year]   ZO#:[id number]   JWJ#:[id number]  Subjective: Patient and husband very anxious about the upcoming cath; discussed echo and lab findings and went over the process in general so she would know what to expect; had a second episode of chest/arm discomfort last PM, currently asymptomatic; concerned re prior experiences with inability to void post-op   Objective: middle-aged White woman examined sitting up on side of bed Filed Vitals:   12/11/11 0434  BP: 107/60  Pulse: 63  Temp: 97.6 F (36.4 C)  Resp: 18    Body mass index is 45.60 kg/(m^2).  Intake/Output Summary (Last 24 hours) at 12/11/11 0826 Last data filed at 12/11/11 0740  Gross per 24 hour  Intake    372 ml  Output   2325 ml  Net  -1953 ml     Sclerae unicteric  Oropharynx clear  No peripheral adenopathy  Lungs clear -- no rales or rhonchi.  Heart regular rate and rhythm, no murmur appreciated  Abdomen soft, NT, +BS  Neuro nonfocal   CBG (last 3)   Basename 12/11/11 0731 12/11/11 0441 12/10/11 2120  GLUCAP 210* 269* 435*     Labs: BNP 1386, CK 91-93, MB 9.6 -7.8, troponin 1.1, 2.31, 1.38, 1.21   Lab Results  Component Value Date   WBC 9.9 12/11/2011   HGB 11.8* 12/11/2011   HCT 36.5 12/11/2011   MCV 87.3 12/11/2011   PLT 162 12/11/2011   NEUTROABS 4.3 12/10/2011     Lab 12/11/11 0405 12/10/11 2240 12/10/11 0145  NA 136 136 136  K 3.6 3.8 3.7  CL 100 100 100  CO2 27 27 21   GLUCOSE 277* 425* 421*  BUN 22 21 15   CREATININE 0.73 0.88 0.74  CALCIUM 8.9 8.6 9.1  MG -- -- --    Urine Studies No results found for this basename: UACOL:2,UAPR:2,USPG:2,UPH:2,UTP:2,UGL:2,UKET:2,UBIL:2,UHGB:2,UNIT:2,UROB:2,ULEU:2,UEPI:2,UWBC:2,URBC:2,UBAC:2,CAST:2,CRYS:2,UCOM:2,BILUA:2 in the last 72 hours     Studies:  Dg Chest 2 View  12/10/2011  *RADIOLOGY REPORT*  Clinical Data: Shortness of breath; history of breast cancer.  CHEST - 2 VIEW  Comparison: Chest radiograph performed 12/02/2011   Findings: The lungs are well-aerated.  Vascular congestion is noted.  Mild left basilar airspace opacity likely reflects atelectasis.  There is no definite evidence for significant pulmonary edema.  There is no evidence of pleural effusion or pneumothorax.  The heart is borderline normal in size; the mediastinal contour is within normal limits.  No acute osseous abnormalities are seen.  A right-sided chest port is noted ending about the distal SVC. Bilateral breast implants are seen.  IMPRESSION: Vascular congestion noted; mild left basilar airspace opacity likely reflects atelectasis, though pneumonia could conceivably have a similar appearance.  Original Report Authenticated By: Tonia Ghent, M.D.   Ct Angio Chest W/cm &/or Wo Cm  12/10/2011  *RADIOLOGY REPORT*  Clinical Data: Bilateral mastectomy 2 weeks ago for breast cancer. Short of breath.  CT ANGIOGRAPHY CHEST  Technique:  Multidetector CT imaging of the chest using the standard protocol during bolus administration of intravenous contrast. Multiplanar reconstructed images including MIPs were obtained and reviewed to evaluate the vascular anatomy.  Contrast: OMNIPAQUE IOHEXOL 300 MG/ML  SOLN  Comparison: Chest x-ray 12/10/2011  Findings: Negative for pulmonary embolism.  No aortic aneurysm or dissection.  Mild atherosclerotic calcification of the coronary arteries.  No pericardial effusion.  Mild bibasilar atelectasis.  No significant pleural effusion. Negative for mass or pneumonia.  No adenopathy.  Bilateral breast reconstruction is noted.  Fatty infiltration of liver.  Gastric banding for obesity.  IMPRESSION: Negative for pulmonary embolism.  Mild bibasilar atelectasis.  Original Report Authenticated By: Camelia Phenes, M.D.    Assessment: 66 year-old [name], [place] woman  (1) s/p bilateral mastectomies and left axillary lymph node dissection 10/18/2011 for a pT2 pN1, stage IIB invasive ductal carcinoma, grade 2, estrogen and progesterone  receptor strongly positive, with no HER-2 amplification and an Mib-1 of 30%, currently day 2 cycle 1 of a truncated treatment with cytoxan and adriamycin (the adriamycin given by continues infusion over 72 hours, disconnected after about 6 hours)  (2) admitted with chest discomfort associated with fluid overload, with symptomatic relief from diuresis, CT angio negative for PE, echo showing well-preserved EF, enzymes as above  Plan: agree with plan for cath +/- stenting; discussed with patient and husband; from a breast cancer point of view we will change her chemo to avoid potentially cardiotoxic agents. Will follow with you   Jakerria Kingbird C 12/11/2011

## 2011-12-11 NOTE — Interval H&P Note (Signed)
History and Physical Interval Note:  12/11/2011 1:26 PM  Jamie [name]  has presented today for surgery, with the diagnosis of chest pain  The various methods of treatment have been discussed with the patient and family. After consideration of risks, benefits and other options for treatment, the patient has consented to  Procedure(s) (LRB): LEFT HEART CATHETERIZATION WITH CORONARY ANGIOGRAM (N/A) as a surgical intervention .  The patients' history has been reviewed, patient examined, no change in status, stable for surgery.  I have reviewed the patients' chart and labs.  Questions were answered to the patient's satisfaction.     [name] [employer]

## 2011-12-11 NOTE — Progress Notes (Signed)
Nursing Note:  Notified by Lab of + MRSA screen.  Pt. Placed on Contact isolation per protocol.  PPE placed on door.  Pt. and family notified of protocol.  IP orders placed and notification sent.

## 2011-12-11 NOTE — CV Procedure (Addendum)
   Cardiac Catheterization Procedure Note  Name: Jamie [name] MRN: [id number] DOB: 01/15/1946  Procedure: Left Heart Cath, Selective Coronary Angiography, LV angiography  Indication: NSTEMI   Procedural details: The right groin was prepped, draped, and anesthetized with 1% lidocaine. Using modified Seldinger technique, a 5 French sheath was introduced into the right femoral artery. Standard Judkins catheters and an MP catheter were used for coronary angiography and left ventriculography. Catheter exchanges were performed over a guidewire. There were no immediate procedural complications. The patient was transferred to the post catheterization recovery area for further monitoring.  Procedural Findings: Hemodynamics:  AO 147/76 LV 154/26   Coronary angiography: Coronary dominance: right  Left mainstem: Short vessel.  There was waveform damping as well as chest pain with engagement of the left main.  There was a 75% ostial left main stenosis.   Left anterior descending (LAD): 80% focal proximal LAD stenosis at D1.   Left circumflex (LCx): Relatively small LCx system mainly consisting of a moderate-sized obtuse marginal.  No significant disease.   Right coronary artery (RCA): The mid RCA is totally occluded after a large acute marginal.  There are collaterals to the PDA from the LAD.   Left ventriculography: Left ventricular systolic function is normal, LVEF is estimated at 55%, with basal inferior hypokinesis.   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  75% ostial LM stenosis with damping and chest pain upon vessel engagement.  80% focal proximal LAD stenosis.  LVEDP moderately elevated.   Recommendations:  Difficult situation.  Patient has breast cancer s/p mastectomy and just started chemotherapy.  Ideally, her coronaries would be revascularized surgically.   LM PCI would be feasible also.  The LCx would be jailed given the short left main but  is a relatively small vessel.  Stent thrombosis would be catastrophic given collateralization of the right so would need to consider opening chronic total occlusion of RCA.   I talked to Dr. Darnelle Catalan about her cancer treatment and prognosis.  She has a good chance for cure.  It would be feasible to delay her chemotherapy for her to undergo CABG and take a hormonal agent such as Femara in the meantime.  I think CABG is going to be best choice for treatment with delay in chemotherapy.  Will consult CVTS.   [name] 12/11/2011, 2:50 PM

## 2011-12-11 NOTE — Progress Notes (Signed)
 PROGRESS NOTE  Subjective:   Jamie [name] had an episode of CP / pressure with bilateral arm pain associated with severe dyspnea.  Cardiac enzymes are positive.  She feels better this am   Objective:    Vital Signs:   Temp:  [97.6 F (36.4 C)-97.8 F (36.6 C)] 97.6 F (36.4 C) (05/08 0434) Pulse Rate:  [63-106] 63  (05/08 0434) Resp:  [18-24] 18  (05/08 0434) BP: (107-180)/(60-92) 107/60 mmHg (05/08 0434) SpO2:  [94 %-96 %] 94 % (05/08 0434)      24-hour weight change: Weight change:   Weight trends: Filed Weights   12/10/11 0613 12/10/11 0614 12/10/11 0647  Weight: 275 lb 14.4 oz (125.147 kg) 272 lb (123.378 kg) 274 lb 0.5 oz (124.3 kg)    Intake/Output:  05/07 0701 - 05/08 0700 In: 360 [P.O.:360] Out: 1300 [Urine:1300]     Physical Exam: BP 107/60  Pulse 63  Temp(Src) 97.6 F (36.4 C) (Oral)  Resp 18  Ht 5' 5" (1.651 m)  Wt 274 lb 0.5 oz (124.3 kg)  BMI 45.60 kg/m2  SpO2 94%  General: Vital signs reviewed and noted. Well-developed, well-nourished, in no acute distress; alert, appropriate and cooperative throughout examination.  Head: Normocephalic, atraumatic.  Eyes: conjunctivae/corneas clear. PERRL, EOM's intact. Fundi benign.  Throat: Oropharynx nonerythematous, no exudate appreciated.   Neck: Supple. Normal carotids. No JVD  Lungs:  Clear bilaterally to auscultation without wheezes, rales, or rhonchi. S/p mastectomies  Heart: Regular rate,  With normal  S1 S2. No murmurs, rubs, or gallops   Abdomen:  Soft, non-tender, non-distended with normoactive bowel sounds. No hepatomegaly. No rebound/guarding. No abdominal masses.  Extremities: No edema.  Distal pedal pulses are 2+ and equal bilaterally.  Neurologic: A&O X3, CN II - XII are grossly intact. Motor strength is 5/5 in the all 4 extremities.  Psych: Responds to questions appropriately with normal affect.    Labs: BMET:  Basename 12/11/11 0405 12/10/11 2240  NA 136 136  K 3.6 3.8  CL 100 100  CO2  27 27  GLUCOSE 277* 425*  BUN 22 21  CREATININE 0.73 0.88  CALCIUM 8.9 8.6  MG -- --  PHOS -- --    Liver function tests: No results found for this basename: AST:2,ALT:2,ALKPHOS:2,BILITOT:2,PROT:2,ALBUMIN:2 in the last 72 hours No results found for this basename: LIPASE:2,AMYLASE:2 in the last 72 hours  CBC:  Basename 12/11/11 0405 12/10/11 0145  WBC 9.9 5.1  NEUTROABS -- 4.3  HGB 11.8* 13.8  HCT 36.5 42.6  MCV 87.3 87.3  PLT 162 156    Cardiac Enzymes:  Basename 12/10/11 1835 12/10/11 1150 12/10/11 0805 12/10/11 0549 12/10/11 0145  CKTOTAL 91 92 93 -- --  CKMB 7.8* 9.2* 9.6* -- --  TROPONINI 1.21* 1.38* -- 2.31* 1.10*    Coagulation Studies: No results found for this basename: LABPROT:5,INR:5 in the last 72 hours  Other: No components found with this basename: POCBNP:3  Basename 12/10/11 1030  DDIMER 1.47*    Basename 12/10/11 0549  HGBA1C 6.7*    Basename 12/10/11 0549  CHOL 166  HDL 41  LDLCALC 108*  TRIG 85  CHOLHDL 4.0   No results found for this basename: TSH,T4TOTAL,FREET3,T3FREE,THYROIDAB in the last 72 hours No results found for this basename: VITAMINB12,FOLATE,FERRITIN,TIBC,IRON,RETICCTPCT in the last 72 hours    Tele:  nsr  Medications:    Infusions:    Scheduled Medications:    . aspirin EC  325 mg Oral Daily  . metoCLOPramide  10 mg   Oral TID AC   And  . diphenhydrAMINE  12.5 mg Oral TID AC  . enoxaparin (LOVENOX) injection  60 mg Subcutaneous Q24H  . furosemide  40 mg Intravenous Q12H  . gabapentin  300 mg Oral TID  . insulin aspart  0-20 Units Subcutaneous TID WC  . insulin aspart  0-5 Units Subcutaneous QHS  . insulin aspart  8 Units Subcutaneous Once  . insulin glargine  10 Units Subcutaneous Once  . loratadine  10 mg Oral Daily  . nitroGLYCERIN  0.5 inch Topical Q6H  . simvastatin  20 mg Oral q1800  . sodium chloride  500 mL Intravenous Once  . sodium chloride  3 mL Intravenous Q12H  . tiotropium  18 mcg Inhalation  Daily  . DISCONTD: aspirin  325 mg Oral Daily  . DISCONTD: dexamethasone  8 mg Oral BID WC  . DISCONTD: enoxaparin (LOVENOX) injection  40 mg Subcutaneous Q24H  . DISCONTD: insulin aspart  0-15 Units Subcutaneous TID WC  . DISCONTD: insulin aspart  0-5 Units Subcutaneous QHS    Assessment/ Plan:    1. Chest pain:  I suspect this was due to CAD.  I have recommended cath.  Discussed risks, benefits, options with patient.   She understands and agrees to proceed.  Disposition: to [facility] later today for cath. Length of Stay: 2  Shawne Bulow J. Ray Glacken, Jr., MD, FACC 12/11/2011, 7:23 AM     

## 2011-12-12 ENCOUNTER — Ambulatory Visit: Payer: Medicare Other

## 2011-12-12 ENCOUNTER — Telehealth: Payer: Self-pay | Admitting: Oncology

## 2011-12-12 ENCOUNTER — Inpatient Hospital Stay (HOSPITAL_COMMUNITY): Payer: Medicare Other

## 2011-12-12 ENCOUNTER — Encounter (INDEPENDENT_AMBULATORY_CARE_PROVIDER_SITE_OTHER): Payer: Medicare Other | Admitting: General Surgery

## 2011-12-12 ENCOUNTER — Other Ambulatory Visit: Payer: Self-pay | Admitting: Oncology

## 2011-12-12 ENCOUNTER — Encounter (HOSPITAL_COMMUNITY): Payer: Self-pay | Admitting: Anesthesiology

## 2011-12-12 DIAGNOSIS — R799 Abnormal finding of blood chemistry, unspecified: Secondary | ICD-10-CM

## 2011-12-12 DIAGNOSIS — K5900 Constipation, unspecified: Secondary | ICD-10-CM | POA: Diagnosis present

## 2011-12-12 DIAGNOSIS — C50919 Malignant neoplasm of unspecified site of unspecified female breast: Secondary | ICD-10-CM

## 2011-12-12 DIAGNOSIS — J438 Other emphysema: Secondary | ICD-10-CM

## 2011-12-12 DIAGNOSIS — I509 Heart failure, unspecified: Secondary | ICD-10-CM

## 2011-12-12 DIAGNOSIS — I2510 Atherosclerotic heart disease of native coronary artery without angina pectoris: Secondary | ICD-10-CM

## 2011-12-12 DIAGNOSIS — Z01810 Encounter for preprocedural cardiovascular examination: Secondary | ICD-10-CM

## 2011-12-12 LAB — COMPREHENSIVE METABOLIC PANEL
ALT: 47 U/L — ABNORMAL HIGH (ref 0–35)
Alkaline Phosphatase: 67 U/L (ref 39–117)
BUN: 24 mg/dL — ABNORMAL HIGH (ref 6–23)
CO2: 28 mEq/L (ref 19–32)
Calcium: 9.1 mg/dL (ref 8.4–10.5)
GFR calc Af Amer: >90 mL/min (ref >90)
GFR calc non Af Amer: 88 mL/min — ABNORMAL LOW (ref >90)
Glucose, Bld: 173 mg/dL — ABNORMAL HIGH (ref 70–99)
Potassium: 3.1 mEq/L — ABNORMAL LOW (ref 3.5–5.1)
Sodium: 138 mEq/L (ref 135–145)

## 2011-12-12 LAB — URINALYSIS, ROUTINE W REFLEX MICROSCOPIC
Bilirubin Urine: NEGATIVE
Glucose, UA: NEGATIVE mg/dL
Protein, ur: NEGATIVE mg/dL
Specific Gravity, Urine: 1.008 (ref 1.005–1.030)
Urobilinogen, UA: 0.2 mg/dL (ref 0.0–1.0)

## 2011-12-12 LAB — GLUCOSE, CAPILLARY
Glucose-Capillary: 131 mg/dL — ABNORMAL HIGH (ref 70–99)
Glucose-Capillary: 169 mg/dL — ABNORMAL HIGH (ref 70–99)
Glucose-Capillary: 307 mg/dL — ABNORMAL HIGH (ref 70–99)

## 2011-12-12 LAB — URINE MICROSCOPIC-ADD ON

## 2011-12-12 LAB — ABO/RH: ABO/RH(D): O NEG

## 2011-12-12 MED ORDER — DEXTROSE 5 % IV SOLN
1.5000 g | INTRAVENOUS | Status: AC
  Administered 2011-12-13: .75 g via INTRAVENOUS
  Administered 2011-12-13: 1.5 g via INTRAVENOUS
  Filled 2011-12-12: qty 1.5

## 2011-12-12 MED ORDER — CHLORHEXIDINE GLUCONATE 4 % EX LIQD
60.0000 mL | Freq: Once | CUTANEOUS | Status: AC
  Administered 2011-12-13: 4 via TOPICAL
  Filled 2011-12-12 (×2): qty 60

## 2011-12-12 MED ORDER — METOPROLOL TARTRATE 12.5 MG HALF TABLET
12.5000 mg | ORAL_TABLET | Freq: Once | ORAL | Status: AC
  Administered 2011-12-13: 12.5 mg via ORAL
  Filled 2011-12-12: qty 1

## 2011-12-12 MED ORDER — PHENYLEPHRINE HCL 10 MG/ML IJ SOLN
30.0000 ug/min | INTRAVENOUS | Status: DC
  Filled 2011-12-12: qty 2

## 2011-12-12 MED ORDER — POTASSIUM CHLORIDE 2 MEQ/ML IV SOLN
80.0000 meq | INTRAVENOUS | Status: DC
  Filled 2011-12-12: qty 40

## 2011-12-12 MED ORDER — POTASSIUM CHLORIDE CRYS ER 20 MEQ PO TBCR
40.0000 meq | EXTENDED_RELEASE_TABLET | Freq: Once | ORAL | Status: AC
  Administered 2011-12-12: 20 meq via ORAL

## 2011-12-12 MED ORDER — EPINEPHRINE HCL 1 MG/ML IJ SOLN
0.5000 ug/min | INTRAVENOUS | Status: DC
  Filled 2011-12-12: qty 4

## 2011-12-12 MED ORDER — SODIUM CHLORIDE 0.9 % IV SOLN
INTRAVENOUS | Status: AC
  Administered 2011-12-13: 1 [IU]/h via INTRAVENOUS
  Filled 2011-12-12: qty 1

## 2011-12-12 MED ORDER — DOPAMINE-DEXTROSE 3.2-5 MG/ML-% IV SOLN
2.0000 ug/kg/min | INTRAVENOUS | Status: DC
  Filled 2011-12-12: qty 250

## 2011-12-12 MED ORDER — TRANEXAMIC ACID (OHS) PUMP PRIME SOLUTION
2.0000 mg/kg | INTRAVENOUS | Status: DC
  Filled 2011-12-12: qty 2.48

## 2011-12-12 MED ORDER — MAGNESIUM SULFATE 50 % IJ SOLN
40.0000 meq | INTRAMUSCULAR | Status: DC
  Filled 2011-12-12: qty 10

## 2011-12-12 MED ORDER — TEMAZEPAM 15 MG PO CAPS
15.0000 mg | ORAL_CAPSULE | Freq: Once | ORAL | Status: AC | PRN
  Administered 2011-12-12: 15 mg via ORAL
  Filled 2011-12-12: qty 1

## 2011-12-12 MED ORDER — FUROSEMIDE 10 MG/ML IJ SOLN
20.0000 mg | Freq: Two times a day (BID) | INTRAMUSCULAR | Status: DC
  Administered 2011-12-12: 17:00:00 via INTRAVENOUS
  Administered 2011-12-13: 20 mg via INTRAVENOUS
  Filled 2011-12-12 (×4): qty 2

## 2011-12-12 MED ORDER — VANCOMYCIN HCL 1000 MG IV SOLR
1500.0000 mg | INTRAVENOUS | Status: AC
  Administered 2011-12-13: 1500 mg via INTRAVENOUS
  Filled 2011-12-12: qty 1500

## 2011-12-12 MED ORDER — POTASSIUM CHLORIDE CRYS ER 20 MEQ PO TBCR
40.0000 meq | EXTENDED_RELEASE_TABLET | Freq: Once | ORAL | Status: AC
  Administered 2011-12-12: 20 meq via ORAL
  Filled 2011-12-12: qty 2

## 2011-12-12 MED ORDER — ALPRAZOLAM 0.25 MG PO TABS
0.2500 mg | ORAL_TABLET | ORAL | Status: DC | PRN
Start: 2011-12-12 — End: 2011-12-13

## 2011-12-12 MED ORDER — DEXTROSE 5 % IV SOLN
750.0000 mg | INTRAVENOUS | Status: DC
  Filled 2011-12-12 (×2): qty 750

## 2011-12-12 MED ORDER — DIAZEPAM 5 MG PO TABS
5.0000 mg | ORAL_TABLET | Freq: Once | ORAL | Status: AC
  Administered 2011-12-13: 5 mg via ORAL
  Filled 2011-12-12: qty 1

## 2011-12-12 MED ORDER — DOCUSATE SODIUM 100 MG PO CAPS
200.0000 mg | ORAL_CAPSULE | Freq: Every day | ORAL | Status: DC
  Filled 2011-12-12 (×2): qty 2

## 2011-12-12 MED ORDER — VANCOMYCIN HCL 1000 MG IV SOLR
1250.0000 mg | INTRAVENOUS | Status: DC
  Filled 2011-12-12: qty 1250

## 2011-12-12 MED ORDER — CHLORHEXIDINE GLUCONATE 4 % EX LIQD: 60.0000 mL | Freq: Once | CUTANEOUS | Status: AC

## 2011-12-12 MED ORDER — DEXMEDETOMIDINE HCL 100 MCG/ML IV SOLN
0.1000 ug/kg/h | INTRAVENOUS | Status: AC
  Administered 2011-12-13: .2 ug/kg/h via INTRAVENOUS
  Filled 2011-12-12: qty 4

## 2011-12-12 MED ORDER — TRANEXAMIC ACID (OHS) BOLUS VIA INFUSION
15.0000 mg/kg | INTRAVENOUS | Status: AC
  Administered 2011-12-13: 1858.5 mg via INTRAVENOUS
  Filled 2011-12-12: qty 1859

## 2011-12-12 MED ORDER — BISACODYL 5 MG PO TBEC: 5.0000 mg | DELAYED_RELEASE_TABLET | Freq: Once | ORAL | Status: DC

## 2011-12-12 MED ORDER — NITROGLYCERIN IN D5W 200-5 MCG/ML-% IV SOLN
2.0000 ug/min | INTRAVENOUS | Status: AC
  Administered 2011-12-13: 5 ug/min via INTRAVENOUS
  Filled 2011-12-12: qty 250

## 2011-12-12 MED ORDER — TRANEXAMIC ACID 100 MG/ML IV SOLN
1.5000 mg/kg/h | INTRAVENOUS | Status: AC
  Administered 2011-12-13: 1.5 mg/kg/h via INTRAVENOUS
  Filled 2011-12-12: qty 25

## 2011-12-12 MED ORDER — SENNOSIDES-DOCUSATE SODIUM 8.6-50 MG PO TABS
1.0000 | ORAL_TABLET | Freq: Every day | ORAL | Status: DC | PRN
  Filled 2011-12-12: qty 1

## 2011-12-12 MED ORDER — CHLORHEXIDINE GLUCONATE 4 % EX LIQD
60.0000 mL | Freq: Once | CUTANEOUS | Status: AC
  Administered 2011-12-12: 4 via TOPICAL

## 2011-12-12 MED ORDER — ALBUTEROL SULFATE (5 MG/ML) 0.5% IN NEBU
2.5000 mg | INHALATION_SOLUTION | Freq: Once | RESPIRATORY_TRACT | Status: AC
  Administered 2011-12-12: 2.5 mg via RESPIRATORY_TRACT

## 2011-12-12 MED ORDER — SODIUM BICARBONATE 8.4 % IV SOLN
INTRAVENOUS | Status: AC
  Administered 2011-12-13: 15:00:00
  Filled 2011-12-12: qty 2.5

## 2011-12-12 MED ORDER — BISACODYL 10 MG RE SUPP
10.0000 mg | Freq: Once | RECTAL | Status: AC
  Administered 2011-12-12: 10 mg via RECTAL
  Filled 2011-12-12: qty 1

## 2011-12-12 NOTE — Progress Notes (Signed)
Jamie [name]   DOB:02/14/1946   ZO#:[id number]   JWJ#:[id number]  Subjective: Events of past 24 h noted; greatly appreciate Drs. Jake Church and Bartle's help to this patient.-- Jamie [name] is understandably "overwhelmed" this AM. Has had no chest pain or anginal equivalent in past 12 hours. Husband not yet in room; son (EMS) in town from [place]   Objective: middle aged White woman examined in bed Filed Vitals:   12/12/11 0402  BP: 119/53  Pulse: 70  Temp: 97.9 F (36.6 C)  Resp: 18    Body mass index is 45.45 kg/(m^2).  Intake/Output Summary (Last 24 hours) at 12/12/11 0743 Last data filed at 12/12/11 0403  Gross per 24 hour  Intake   1019 ml  Output   1875 ml  Net   -856 ml     Sclerae unicteric  Oropharynx clear  Lungs clear -- no rales or rhonchi, auscultated ant/lat  Heart regular rate and rhythm, no murmur appreciated  Abdomen benign  MSK no focal spinal tenderness  Neuro nonfocal   CBG (last 3)   Basename 12/11/11 2115 12/11/11 1541 12/11/11 1145  GLUCAP 246* 104* 171*     Labs:  Lab Results  Component Value Date   WBC 7.7 12/11/2011   HGB 12.2 12/11/2011   HCT 37.4 12/11/2011   MCV 87.8 12/11/2011   PLT 138* 12/11/2011   NEUTROABS 4.3 12/10/2011     Lab 12/11/11 1624 12/11/11 0405 12/10/11 2240 12/10/11 0145  NA -- 136 136 136  K -- 3.6 3.8 3.7  CL -- 100 100 100  CO2 -- 27 27 21   GLUCOSE -- 277* 425* 421*  BUN -- 22 21 15   CREATININE 0.65 0.73 0.88 0.74  CALCIUM -- 8.9 8.6 9.1  MG -- -- -- --     Urine Studies No results found for this basename: UACOL:2,UAPR:2,USPG:2,UPH:2,UTP:2,UGL:2,UKET:2,UBIL:2,UHGB:2,UNIT:2,UROB:2,ULEU:2,UEPI:2,UWBC:2,URBC:2,UBAC:2,CAST:2,CRYS:2,UCOM:2,BILUA:2 in the last 72 hours     Studies:  Ct Angio Chest W/cm &/or Wo Cm  12/10/2011  *RADIOLOGY REPORT*  Clinical Data: Bilateral mastectomy 2 weeks ago for breast cancer. Short of breath.  CT ANGIOGRAPHY CHEST  Technique:  Multidetector CT imaging of the chest using the standard  protocol during bolus administration of intravenous contrast. Multiplanar reconstructed images including MIPs were obtained and reviewed to evaluate the vascular anatomy.  Contrast: OMNIPAQUE IOHEXOL 300 MG/ML  SOLN  Comparison: Chest x-ray 12/10/2011  Findings: Negative for pulmonary embolism.  No aortic aneurysm or dissection.  Mild atherosclerotic calcification of the coronary arteries.  No pericardial effusion.  Mild bibasilar atelectasis.  No significant pleural effusion. Negative for mass or pneumonia.  No adenopathy.  Bilateral breast reconstruction is noted.  Fatty infiltration of liver.  Gastric banding for obesity.  IMPRESSION: Negative for pulmonary embolism.  Mild bibasilar atelectasis.  Original Report Authenticated By: Camelia Phenes, M.D.    Assessment: 66 year-old Jamie [name], Jamie [name] woman   (1) s/p bilateral mastectomies and left axillary lymph node dissection 10/18/2011 for a pT2 pN1, stage IIB invasive ductal carcinoma, grade 2, estrogen and progesterone receptor strongly positive, with no HER-2 amplification and an Mib-1 of 30%,  (2) s/p her first, truncated treatment with cytoxan and adriamycin May 6 (the adriamycin given by continues infusion over 72 hours, disconnected after about 6 hours)   (3) admitted May 6, a few hours after chemotherapy started, unstable angina, CT angio negative for PE, echo showing well-preserved EF, enzymes/ECG showing a NSTEMI, s/p cath May 7 showing left main, LAD and RCA disease with a well-preserved  ejection fraction, revascularization planned for 12/13/2011   Plan: fully agree with current plan, and the goal of chemo/anti-estrogen therapy in this patient is cure; aggressive cardiac intervention is appropriate, as discussed. I will hold chemo plans until patient fully recovered, but would like to resume before June 15 (3 month window from surgery); will avoid cardiotoxic drugs, so she will receive cytoxan/taxotere; will alert Dr. Kelly Splinter so she can  schedule further expander inflation as appropriate; will start patient on letrozole sometime next week--don't want to add hot flashes and other nuisance symptoms to her current problems. She will see me as outpatient May 28 (have cancelled intervening visits) to discuss resuming adjuvant treatment.   [name] 12/12/2011

## 2011-12-12 NOTE — Progress Notes (Signed)
1 Day Post-Op  Subjective: Chest pain and recent MI  Objective: Vital signs in last 24 hours: Temp:  [97.2 F (36.2 C)-98.6 F (37 C)] 98.6 F (37 C) (05/09 0751) Pulse Rate:  [68-84] 68  (05/09 0751) Resp:  [14-18] 15  (05/09 0751) BP: (119-160)/(53-116) 135/58 mmHg (05/09 0751) SpO2:  [93 %-100 %] 95 % (05/09 0751) Weight:  [123.9 kg (273 lb 2.4 oz)] 123.9 kg (273 lb 2.4 oz) (05/08 1900) Last BM Date: 12/10/11  Intake/Output from previous day: 05/08 0701 - 05/09 0700 In: 1019 [P.O.:690; I.V.:235; IV Piggyback:94] Out: 2450 [Urine:2450] Intake/Output this shift:    General appearance: alert, cooperative and no distress Incision/Wound: healing well at the mastectomy sites.  Lab Results:   Basename 12/11/11 1624 12/11/11 0405  WBC 7.7 9.9  HGB 12.2 11.8*  HCT 37.4 36.5  PLT 138* 162   BMET  Basename 12/11/11 1624 12/11/11 0405 12/10/11 2240  NA -- 136 136  K -- 3.6 3.8  CL -- 100 100  CO2 -- 27 27  GLUCOSE -- 277* 425*  BUN -- 22 21  CREATININE 0.65 0.73 --  CALCIUM -- 8.9 8.6   PT/INR  Basename 12/11/11 0920  LABPROT 13.6  INR 1.02   ABG No results found for this basename: PHART:2,PCO2:2,PO2:2,HCO3:2 in the last 72 hours  Studies/Results: Ct Angio Chest W/cm &/or Wo Cm  12/10/2011  *RADIOLOGY REPORT*  Clinical Data: Bilateral mastectomy 2 weeks ago for breast cancer. Short of breath.  CT ANGIOGRAPHY CHEST  Technique:  Multidetector CT imaging of the chest using the standard protocol during bolus administration of intravenous contrast. Multiplanar reconstructed images including MIPs were obtained and reviewed to evaluate the vascular anatomy.  Contrast: OMNIPAQUE IOHEXOL 300 MG/ML  SOLN  Comparison: Chest x-ray 12/10/2011  Findings: Negative for pulmonary embolism.  No aortic aneurysm or dissection.  Mild atherosclerotic calcification of the coronary arteries.  No pericardial effusion.  Mild bibasilar atelectasis.  No significant pleural effusion.  Negative for mass or pneumonia.  No adenopathy.  Bilateral breast reconstruction is noted.  Fatty infiltration of liver.  Gastric banding for obesity.  IMPRESSION: Negative for pulmonary embolism.  Mild bibasilar atelectasis.  Original Report Authenticated By: Camelia Phenes, M.D.    Anti-infectives: Anti-infectives    None      Assessment/Plan: s/p Procedure(s) (LRB): LEFT HEART CATHETERIZATION WITH CORONARY ANGIOGRAM (N/A) 150 cc of saline was removed from each expander to facilitate surgery tomorrow.  LOS: 3 days    [facility] 12/12/2011

## 2011-12-12 NOTE — Progress Notes (Signed)
COMPLAINED OF ON AND  OFF BURNING SENSATION ON FOLEY SITE, CLAIMED THAT IN [facility] THEY ATTEMPTED TO  INSERT SAID CATH 3X. PA  AWARE. FOLEY CHECKED GOOD POSITION. CONTINUE TO MONITOR.

## 2011-12-12 NOTE — Progress Notes (Signed)
1 Day Post-Op Procedure(s) (LRB): LEFT HEART CATHETERIZATION WITH CORONARY ANGIOGRAM (N/A) Subjective: No chest pain or SOB  Objective: Vital signs in last 24 hours: Temp:  [97.2 F (36.2 C)-98.6 F (37 C)] 97.6 F (36.4 C) (05/09 1554) Pulse Rate:  [68-83] 75  (05/09 1554) Cardiac Rhythm:  [-] Normal sinus rhythm (05/09 0751) Resp:  [14-18] 18  (05/09 1554) BP: (104-153)/(46-65) 104/46 mmHg (05/09 1554) SpO2:  [93 %-97 %] 94 % (05/09 1554) Weight:  [123.9 kg (273 lb 2.4 oz)] 123.9 kg (273 lb 2.4 oz) (05/08 1900)  Hemodynamic parameters for last 24 hours:    Intake/Output from previous day: 05/08 0701 - 05/09 0700 In: 1019 [P.O.:690; I.V.:235; IV Piggyback:94] Out: 2450 [Urine:2450] Intake/Output this shift: Total I/O In: 590 [P.O.:550; IV Piggyback:40] Out: 2101 [Urine:2100; Stool:1]  General appearance: alert and cooperative Heart: regular rate and rhythm, S1, S2 normal, no murmur, click, rub or gallop Lungs: clear to auscultation bilaterally Chest wall tissue expanders smaller by 150 cc on each side Lab Results:  Basename 12/11/11 1624 12/11/11 0405  WBC 7.7 9.9  HGB 12.2 11.8*  HCT 37.4 36.5  PLT 138* 162   BMET:  Basename 12/12/11 0900 12/11/11 1624 12/11/11 0405  NA 138 -- 136  K 3.1* -- 3.6  CL 97 -- 100  CO2 28 -- 27  GLUCOSE 173* -- 277*  BUN 24* -- 22  CREATININE 0.70 0.65 --  CALCIUM 9.1 -- 8.9    PT/INR:  Basename 12/11/11 0920  LABPROT 13.6  INR 1.02   ABG No results found for this basename: phart, pco2, po2, hco3, tco2, acidbasedef, o2sat   CBG (last 3)   Basename 12/12/11 1643 12/12/11 1224 12/12/11 0750  GLUCAP 169* 131* 159*    Assessment/Plan: S/P Procedure(s) (LRB): LEFT HEART CATHETERIZATION WITH CORONARY ANGIOGRAM (N/A) Stable for CABG tomorrow afternoon. Patient and husband have no further questions.   LOS: 3 days    Jamie [name] K 12/12/2011

## 2011-12-12 NOTE — Telephone Encounter (Signed)
Cancelled all of the pt's appt  Prior to 12/31/2011 due to the pt having a procedure

## 2011-12-12 NOTE — Progress Notes (Addendum)
Pre-op Cardiac Surgery  Carotid Findings:  Bilateral 40-59% ICA stenosis, lowest end of scale. Vertebral artery flow is antegrade.     Upper Extremity Right Left  Brachial Pressures 104-Triphasic Biphasic, unable to obtain pressure due to lymphectomy.  Radial Waveforms Triphasic Biphasic  Ulnar Waveforms Biphasic Biphasic  Palmar Arch (Allen's Test) Signal obliterates with radial compression, is unaffected with ulnar compression. Signal is unaffected with radial compression, decreases >50% with ulnar compression.     Lower  Extremity Right Left  Dorsalis Pedis 128-Severely dampened monophasic. 77-Severely dampened monophasic  Anterior Tibial    Posterior Tibial 122-Severely dampened monophasic 137-Severely dampened monophasic  Ankle/Brachial Indices 1.23 1.32    Findings:   Right ABI= 1.23, left ABI= 1.32, which is indicative of calcified vessels bilaterally.   12/12/2011 7:08 PM Candace Rosezetta Schlatter, RVT Elpidio Galea, RDMS, RDCS

## 2011-12-12 NOTE — Progress Notes (Signed)
Patient: Jamie [name] Date of Encounter: 12/12/2011, 8:32 AM Admit date: 12/09/2011     Subjective  She is overwhelmed with all of news of her heart. She is also upset because she can't seem to have a BM on the bedside commode. She is not constipated but the situation is stressing her out. No CP, SOB. Breathing feels at baseline.   Objective   Telemetry: NSR, occasional PVCs Physical Exam: Filed Vitals:   12/12/11 0751  BP: 135/58  Pulse: 68  Temp: 98.6 F (37 C)  Resp: 15   General: Well developed obese WF in no acute distress Head: Normocephalic, atraumatic, sclera non-icteric, no xanthomas, nares are without discharge.  Neck: Negative for carotid bruits. JVD not elevated. Lungs: Coarse at bases but otherwise clear. No wheezing or rhonchi. Heart: RRR S1 S2 without murmurs, rubs, or gallops.  Abdomen: Soft, non-tender, non-distended with normoactive bowel sounds. No hepatomegaly. No rebound/guarding. No obvious abdominal masses. Msk:  Strength and tone appear normal for age. Extremities: No clubbing or cyanosis. Tr-1+ soft sockline edema.  Distal pedal pulses are 2+ and equal bilaterally. Neuro: Alert and oriented X 3. Moves all extremities spontaneously. Psych:  Responds to questions appropriately with a depressed affect, tearful at times    Intake/Output Summary (Last 24 hours) at 12/12/11 0832 Last data filed at 12/12/11 0403  Gross per 24 hour  Intake   1019 ml  Output   1875 ml  Net   -856 ml    Inpatient Medications:    . aspirin  324 mg Oral Pre-Cath  . aspirin  81 mg Oral Daily  . atorvastatin  80 mg Oral q1800  . bisoprolol  2.5 mg Oral Daily  . Chlorhexidine Gluconate Cloth  6 each Topical q morning - 10a  . diazepam  5 mg Oral On Call  . metoCLOPramide  10 mg Oral TID AC   And  . diphenhydrAMINE  12.5 mg Oral TID AC  . fentaNYL      . furosemide  40 mg Intravenous Q12H  . gabapentin  300 mg Oral TID  . heparin      . heparin  5,000 Units Subcutaneous  Q8H  . insulin aspart  0-20 Units Subcutaneous TID WC  . insulin aspart  0-5 Units Subcutaneous QHS  . lidocaine      . loratadine  10 mg Oral Daily  . midazolam      . mupirocin ointment  1 application Nasal BID  . nitroGLYCERIN  0.5 inch Topical Q6H  . nitroGLYCERIN      . polyethylene glycol  17 g Oral QHS  . sodium chloride  500 mL Intravenous Once  . sodium chloride  3 mL Intravenous Q12H  . tiotropium  18 mcg Inhalation Daily  . DISCONTD: aspirin EC  325 mg Oral Daily  . DISCONTD: Chlorhexidine Gluconate Cloth  6 each Topical Q0600  . DISCONTD: Chlorhexidine Gluconate Cloth  6 each Topical Q0600  . DISCONTD: enoxaparin (LOVENOX) injection  60 mg Subcutaneous Q24H  . DISCONTD: mupirocin ointment  1 application Nasal BID  . DISCONTD: simvastatin  20 mg Oral q1800  . DISCONTD: sodium chloride  3 mL Intravenous Q12H    Labs:  Basename 12/11/11 1624 12/11/11 0405 12/10/11 2240  NA -- 136 136  K -- 3.6 3.8  CL -- 100 100  CO2 -- 27 27  GLUCOSE -- 277* 425*  BUN -- 22 21  CREATININE 0.65 0.73 --  CALCIUM -- 8.9 8.6  MG -- -- --  PHOS -- -- --      Basename 12/11/11 1624 12/11/11 0405 12/10/11 0145  WBC 7.7 9.9 --  NEUTROABS -- -- 4.3  HGB 12.2 11.8* --  HCT 37.4 36.5 --  MCV 87.8 87.3 --  PLT 138* 162 --    Basename 12/10/11 1835 12/10/11 1150 12/10/11 0805 12/10/11 0549 12/10/11 0145  CKTOTAL 91 92 93 -- --  CKMB 7.8* 9.2* 9.6* -- --  TROPONINI 1.21* 1.38* -- 2.31* 1.10*   No components found with this basename: POCBNP:3  Basename 12/10/11 1030  DDIMER 1.47*    Basename 12/10/11 0549  HGBA1C 6.7*    Basename 12/10/11 0549  CHOL 166  HDL 41  LDLCALC 108*  TRIG 85  CHOLHDL 4.0   Radiology/Studies:  1. Chest 2 View 12/10/2011  *RADIOLOGY REPORT*  Clinical Data: Shortness of breath; history of breast cancer.  CHEST - 2 VIEW  Comparison: Chest radiograph performed 12/02/2011  Findings: The lungs are well-aerated.  Vascular congestion is noted.  Mild  left basilar airspace opacity likely reflects atelectasis.  There is no definite evidence for significant pulmonary edema.  There is no evidence of pleural effusion or pneumothorax.  The heart is borderline normal in size; the mediastinal contour is within normal limits.  No acute osseous abnormalities are seen.  A right-sided chest port is noted ending about the distal SVC. Bilateral breast implants are seen.  IMPRESSION: Vascular congestion noted; mild left basilar airspace opacity likely reflects atelectasis, though pneumonia could conceivably have a similar appearance.  Original Report Authenticated By: Tonia Ghent, M.D.   2. Ct Angio Chest W/cm &/or Wo Cm 12/10/2011  *RADIOLOGY REPORT*  Clinical Data: Bilateral mastectomy 2 weeks ago for breast cancer. Short of breath.  CT ANGIOGRAPHY CHEST  Technique:  Multidetector CT imaging of the chest using the standard protocol during bolus administration of intravenous contrast. Multiplanar reconstructed images including MIPs were obtained and reviewed to evaluate the vascular anatomy.  Contrast: OMNIPAQUE IOHEXOL 300 MG/ML  SOLN  Comparison: Chest x-ray 12/10/2011  Findings: Negative for pulmonary embolism.  No aortic aneurysm or dissection.  Mild atherosclerotic calcification of the coronary arteries.  No pericardial effusion.  Mild bibasilar atelectasis.  No significant pleural effusion. Negative for mass or pneumonia.  No adenopathy.  Bilateral breast reconstruction is noted.  Fatty infiltration of liver.  Gastric banding for obesity.  IMPRESSION: Negative for pulmonary embolism.  Mild bibasilar atelectasis.  Original Report Authenticated By: Camelia Phenes, M.D.    Assessment and Plan   1. CAD/NSTEMI favored for CABG, scheduled for Friday afternoon - no CP. Continue ASA, statin, BB. Will get CMET this morning post-cath, and also to eval LFTs given statin initiation with fatty liver on CT.   2. Breast CA s/p bilat mastectomies- appreciate Dr.  Darrall Dears input, chemo will be placed on hold.  3. Acute diastolic CHF - weights variable over last few days. Good UOP yesterday. Her breathing feels at baseline. Has some chronic LEE. ?D/c Lasix today - will defer to MD.  Signed, Jamie Spies PA-C  Reviewed cath films and discussed with Dr Darnelle Catalan at bedside.  Agree with CABG tomorrow.  Patient understands plan of care and will have close F/U fro breast cancer post CABG Jamie [name] 9:26 AM 12/12/2011

## 2011-12-12 NOTE — Progress Notes (Signed)
[facility]   Subjective: Ambulating in room to bedside commode. Denies chest pain or shortness of breath. Primary complaint is of abdominal fullness and sensation of need to pass bowel movement but feels severely constipated. She is quite distressed that she is unable to be taken to the regular toilet to have BM and states she is really unable to utilize the bedside commode. States is normally on stool softeners and MiraLax at home.  Objective: Vital signs in last 24 hours: Temp:  [97.2 F (36.2 C)-98.6 F (37 C)] 98.6 F (37 C) (05/09 0751) Pulse Rate:  [68-84] 68  (05/09 0751) Resp:  [14-18] 15  (05/09 0751) BP: (119-160)/(53-116) 135/58 mmHg (05/09 0751) SpO2:  [93 %-100 %] 95 % (05/09 0751) Weight:  [123.9 kg (273 lb 2.4 oz)] 123.9 kg (273 lb 2.4 oz) (05/08 1900) Weight change:  Last BM Date: 12/10/11  Intake/Output from previous day: 05/08 0701 - 05/09 0700 In: 1019 [P.O.:690; I.V.:235; IV Piggyback:94] Out: 2450 [Urine:2450] Intake/Output this shift: Total I/O In: 40 [IV Piggyback:40] Out: 1800 [Urine:1800]  General appearance: alert, cooperative, appears stated age, mild distress as evidenced by anxiety related to sensation of constipation and moderately obese Resp: Coarse but clear to auscultation bilaterally, room air with saturations 95% Cardio: regular rate and sinus rhythm, S1, S2 normal, no murmur, click, rub or gallop GI: soft, non-tender; bowel sounds normal; no masses,  no organomegaly Extremities: extremities normal, atraumatic, no cyanosis or edema Neurologic: Grossly normal-anxious  Lab Results:  Basename 12/11/11 1624 12/11/11 0405  WBC 7.7 9.9  HGB 12.2 11.8*  HCT 37.4 36.5  PLT 138* 162   BMET  Basename 12/12/11 0900 12/11/11 1624 12/11/11 0405  NA 138 -- 136  K 3.1* -- 3.6  CL 97 -- 100  CO2 28 -- 27  GLUCOSE 173* -- 277*  BUN 24* -- 22  CREATININE 0.70 0.65 --  CALCIUM 9.1 -- 8.9    Studies/Results: Ct Angio Chest W/cm &/or  Wo Cm  12/10/2011  *RADIOLOGY REPORT*  Clinical Data: Bilateral mastectomy 2 weeks ago for breast cancer. Short of breath.  CT ANGIOGRAPHY CHEST  Technique:  Multidetector CT imaging of the chest using the standard protocol during bolus administration of intravenous contrast. Multiplanar reconstructed images including MIPs were obtained and reviewed to evaluate the vascular anatomy.  Contrast: OMNIPAQUE IOHEXOL 300 MG/ML  SOLN  Comparison: Chest x-ray 12/10/2011  Findings: Negative for pulmonary embolism.  No aortic aneurysm or dissection.  Mild atherosclerotic calcification of the coronary arteries.  No pericardial effusion.  Mild bibasilar atelectasis.  No significant pleural effusion. Negative for mass or pneumonia.  No adenopathy.  Bilateral breast reconstruction is noted.  Fatty infiltration of liver.  Gastric banding for obesity.  IMPRESSION: Negative for pulmonary embolism.  Mild bibasilar atelectasis.  Original Report Authenticated By: Camelia Phenes, M.D.    Medications: I have reviewed the patient's current medications.  Assessment/Plan:  Principal Problem:  *CAD (coronary artery disease), native coronary artery-2 vessel *CABG planned for Friday afternoon 12/13/2011 by Dr. Laneta Simmers *Currently stable without any acute ischemic symptoms described *Continue aspirin, statin, subcutaneous heparin, topical nitroglycerin and beta blocker  Active Problems:  Acute diastolic CHF (congestive heart failure)-grade 1 *Appears euvolemic therefore we'll decrease Lasix dosage today-cardiology also recommends we can likely discontinue Lasix soon   COPD (chronic obstructive pulmonary disease) *Compensated and no wheeze *Continue supportive care with when necessary nebulizers   Constipation *Continue MiraLax and Reglan *Patient requests suppository-and if this not effective enema therefore  we have ordered these medications   Breast cancer- pT2 pN1 stage IIB invasive ductal carcinoma grade 2  estrogen and progesterone receptor strongly positive *Underwent bilateral mastectomy and left axillary lymph node dissection on 10/18/2011  *Initially treated with Cytoxan and Adriamycin on May 6 but this infusion was disconnected after about 6 hours after patient developed chest pain that required admission to the hospital for further evaluation *Oncology has evaluated the patient this admission with plans to hold chemotherapy the patient fully recovered from her cardiac surgery but anticipate returning June 15. *Oncology would like to avoid cardiotoxic drugs so she will receive Cytoxan and Taxotere. *He also plans on notifying Dr. Kelly Splinter (plastic surgery) as the patient can be scheduled for further expander inflation as appropriate *Plan is to start letrozole sometime in the next week *Already has outpatient appointment scheduled with Dr. Darnelle Catalan on May 28   Hyperlipidemia *Continue statin *LDL cholesterol slightly elevated this admission at 108   Obesity  Disposition *Remain in step down until CABG  *Postoperatively she will be in the cardiovascular intensive care unit and expect the cardiothoracic surgeons to assume primary care of this patient    LOS: 3 days   Junious Silk, ANP pager [phone]  [facility] [email or web address] Password: [email or web address]  12/12/2011, 12:12 PM  I have examined the patient, reviewed the chart and modified the above note which I agree with.   Calvert Cantor, MD [phone]

## 2011-12-13 ENCOUNTER — Encounter (HOSPITAL_COMMUNITY): Payer: Self-pay | Admitting: Anesthesiology

## 2011-12-13 ENCOUNTER — Encounter (HOSPITAL_COMMUNITY)
Admission: EM | Disposition: A | Discharge status: Home / Self Care | Payer: Self-pay | Source: Home / Self Care | Attending: Surgery

## 2011-12-13 ENCOUNTER — Inpatient Hospital Stay (HOSPITAL_COMMUNITY): Payer: Medicare Other

## 2011-12-13 ENCOUNTER — Inpatient Hospital Stay (HOSPITAL_COMMUNITY): Payer: Medicare Other | Admitting: Anesthesiology

## 2011-12-13 DIAGNOSIS — J438 Other emphysema: Secondary | ICD-10-CM

## 2011-12-13 DIAGNOSIS — I509 Heart failure, unspecified: Secondary | ICD-10-CM

## 2011-12-13 DIAGNOSIS — C50919 Malignant neoplasm of unspecified site of unspecified female breast: Secondary | ICD-10-CM

## 2011-12-13 DIAGNOSIS — R799 Abnormal finding of blood chemistry, unspecified: Secondary | ICD-10-CM

## 2011-12-13 DIAGNOSIS — I2510 Atherosclerotic heart disease of native coronary artery without angina pectoris: Secondary | ICD-10-CM

## 2011-12-13 HISTORY — PX: CORONARY ARTERY BYPASS GRAFT: SHX141

## 2011-12-13 LAB — POCT I-STAT 3, ART BLOOD GAS (G3+)
Acid-Base Excess: 7 mmol/L — ABNORMAL HIGH (ref 0.0–2.0)
Bicarbonate: 28.8 mEq/L — ABNORMAL HIGH (ref 20.0–24.0)
Bicarbonate: 27.6 mEq/L — ABNORMAL HIGH (ref 20.0–24.0)
Bicarbonate: 24.9 mEq/L — ABNORMAL HIGH (ref 20.0–24.0)
O2 Saturation: 100 %
O2 Saturation: 98 %
Patient temperature: 32.5
Patient temperature: 37.1
TCO2: 30 mmol/L (ref 0–100)
TCO2: 26 mmol/L (ref 0–100)
pCO2 arterial: 38.8 mmHg (ref 35.0–45.0)
pH, Arterial: 7.461 — ABNORMAL HIGH (ref 7.350–7.400)
pH, Arterial: 7.395 (ref 7.350–7.400)
pO2, Arterial: 354 mmHg — ABNORMAL HIGH (ref 80.0–100.0)
pO2, Arterial: 379 mmHg — ABNORMAL HIGH (ref 80.0–100.0)

## 2011-12-13 LAB — BLOOD GAS, ARTERIAL
Drawn by: 31101
FIO2: 0.21 %
Patient temperature: 98.6
pCO2 arterial: 39.9 mmHg (ref 35.0–45.0)
pH, Arterial: 7.455 — ABNORMAL HIGH (ref 7.350–7.400)

## 2011-12-13 LAB — POCT I-STAT 4, (NA,K, GLUC, HGB,HCT)
Glucose, Bld: 121 mg/dL — ABNORMAL HIGH (ref 70–99)
Glucose, Bld: 166 mg/dL — ABNORMAL HIGH (ref 70–99)
HCT: 39 % (ref 36.0–46.0)
HCT: 28 % — ABNORMAL LOW (ref 36.0–46.0)
HCT: 33 % — ABNORMAL LOW (ref 36.0–46.0)
Hemoglobin: 13.3 g/dL (ref 12.0–15.0)
Hemoglobin: 11.6 g/dL — ABNORMAL LOW (ref 12.0–15.0)
Hemoglobin: 8.8 g/dL — ABNORMAL LOW (ref 12.0–15.0)
Hemoglobin: 10.2 g/dL — ABNORMAL LOW (ref 12.0–15.0)
Potassium: 4.8 mEq/L (ref 3.5–5.1)
Potassium: 4.5 mEq/L (ref 3.5–5.1)
Potassium: 5.6 mEq/L — ABNORMAL HIGH (ref 3.5–5.1)
Potassium: 4.4 mEq/L (ref 3.5–5.1)
Potassium: 4.1 mEq/L (ref 3.5–5.1)
Sodium: 140 mEq/L (ref 135–145)

## 2011-12-13 LAB — POCT I-STAT 3, VENOUS BLOOD GAS (G3P V)
Bicarbonate: 26.8 mEq/L — ABNORMAL HIGH (ref 20.0–24.0)
Patient temperature: 32.5
TCO2: 28 mmol/L (ref 0–100)
pCO2, Ven: 30.2 mmHg — ABNORMAL LOW (ref 45.0–50.0)
pH, Ven: 7.539 — ABNORMAL HIGH (ref 7.250–7.300)
pO2, Ven: 30 mmHg (ref 30.0–45.0)

## 2011-12-13 LAB — CBC
HCT: 38.3 % (ref 36.0–46.0)
Hemoglobin: 12.2 g/dL (ref 12.0–15.0)
MCV: 86.8 fL (ref 78.0–100.0)
Platelets: 111 10*3/uL — ABNORMAL LOW (ref 150–400)
RDW: 13.9 % (ref 11.5–15.5)
RDW: 13.6 % (ref 11.5–15.5)
WBC: 6.3 10*3/uL (ref 4.0–10.5)
WBC: 7.3 10*3/uL (ref 4.0–10.5)

## 2011-12-13 LAB — BASIC METABOLIC PANEL
Chloride: 98 mEq/L (ref 96–112)
Creatinine, Ser: 0.77 mg/dL (ref 0.50–1.10)
GFR calc Af Amer: >90 mL/min (ref >90)
GFR calc non Af Amer: 85 mL/min — ABNORMAL LOW (ref >90)
Potassium: 3.6 mEq/L (ref 3.5–5.1)

## 2011-12-13 LAB — PROTIME-INR: Prothrombin Time: 16.3 seconds — ABNORMAL HIGH (ref 11.6–15.2)

## 2011-12-13 LAB — SURGICAL PCR SCREEN: MRSA, PCR: POSITIVE — AB

## 2011-12-13 LAB — APTT: aPTT: 33 seconds (ref 24–37)

## 2011-12-13 LAB — POCT I-STAT GLUCOSE: Glucose, Bld: 150 mg/dL — ABNORMAL HIGH (ref 70–99)

## 2011-12-13 SURGERY — CORONARY ARTERY BYPASS GRAFTING (CABG)
Anesthesia: General | Site: Chest | Wound class: Clean

## 2011-12-13 MED ORDER — POTASSIUM CHLORIDE CRYS ER 20 MEQ PO TBCR
40.0000 meq | EXTENDED_RELEASE_TABLET | Freq: Once | ORAL | Status: AC
  Administered 2011-12-13: 40 meq via ORAL
  Filled 2011-12-13: qty 1

## 2011-12-13 MED ORDER — SODIUM CHLORIDE 0.9 % IV SOLN
INTRAVENOUS | Status: DC
  Administered 2011-12-14: 10:00:00 via INTRAVENOUS
  Filled 2011-12-13 (×2): qty 1

## 2011-12-13 MED ORDER — FENTANYL CITRATE 0.05 MG/ML IJ SOLN
INTRAMUSCULAR | Status: AC
  Filled 2011-12-13: qty 2

## 2011-12-13 MED ORDER — METOPROLOL TARTRATE 25 MG/10 ML ORAL SUSPENSION
12.5000 mg | Freq: Two times a day (BID) | ORAL | Status: DC
  Administered 2011-12-14: 12.5 mg
  Filled 2011-12-13 (×7): qty 5

## 2011-12-13 MED ORDER — FENTANYL CITRATE 0.05 MG/ML IJ SOLN
50.0000 ug | INTRAMUSCULAR | Status: DC | PRN
  Administered 2011-12-13 (×2): 50 ug via INTRAVENOUS

## 2011-12-13 MED ORDER — ARTIFICIAL TEARS OP OINT
TOPICAL_OINTMENT | OPHTHALMIC | Status: DC | PRN
  Administered 2011-12-13: 1 via OPHTHALMIC

## 2011-12-13 MED ORDER — DEXMEDETOMIDINE HCL 100 MCG/ML IV SOLN
0.1000 ug/kg/h | INTRAVENOUS | Status: DC
  Filled 2011-12-13: qty 4

## 2011-12-13 MED ORDER — VANCOMYCIN HCL IN DEXTROSE 1-5 GM/200ML-% IV SOLN
1000.0000 mg | Freq: Once | INTRAVENOUS | Status: AC
  Administered 2011-12-14: 1000 mg via INTRAVENOUS
  Filled 2011-12-13: qty 200

## 2011-12-13 MED ORDER — 0.9 % SODIUM CHLORIDE (POUR BTL) OPTIME
TOPICAL | Status: DC | PRN
  Administered 2011-12-13: 6000 mL

## 2011-12-13 MED ORDER — SODIUM CHLORIDE 0.9 % IV SOLN: INTRAVENOUS | Status: DC

## 2011-12-13 MED ORDER — ASPIRIN 81 MG PO CHEW: 324.0000 mg | CHEWABLE_TABLET | Freq: Every day | ORAL | Status: DC

## 2011-12-13 MED ORDER — TRANEXAMIC ACID 100 MG/ML IV SOLN
1.5000 mg/kg/h | INTRAVENOUS | Status: DC
  Filled 2011-12-13 (×2): qty 25

## 2011-12-13 MED ORDER — PHENYLEPHRINE HCL 10 MG/ML IJ SOLN
10.0000 mg | INTRAVENOUS | Status: DC | PRN
  Administered 2011-12-13: 10 ug/min via INTRAVENOUS

## 2011-12-13 MED ORDER — DEXMEDETOMIDINE HCL 100 MCG/ML IV SOLN
0.1000 ug/kg/h | INTRAVENOUS | Status: DC
  Filled 2011-12-13: qty 2

## 2011-12-13 MED ORDER — DOCUSATE SODIUM 100 MG PO CAPS
200.0000 mg | ORAL_CAPSULE | Freq: Every day | ORAL | Status: DC
  Administered 2011-12-14 – 2011-12-16 (×3): 200 mg via ORAL
  Filled 2011-12-13 (×3): qty 2

## 2011-12-13 MED ORDER — ACETAMINOPHEN 160 MG/5ML PO SOLN
975.0000 mg | Freq: Four times a day (QID) | ORAL | Status: DC
  Filled 2011-12-13: qty 40.6

## 2011-12-13 MED ORDER — SUCCINYLCHOLINE CHLORIDE 20 MG/ML IJ SOLN
INTRAMUSCULAR | Status: DC | PRN
  Administered 2011-12-13: 100 mg via INTRAVENOUS

## 2011-12-13 MED ORDER — OXYCODONE HCL 5 MG PO TABS
5.0000 mg | ORAL_TABLET | ORAL | Status: DC | PRN
  Administered 2011-12-14 (×2): 10 mg via ORAL
  Filled 2011-12-13 (×2): qty 2

## 2011-12-13 MED ORDER — BISACODYL 10 MG RE SUPP: 10.0000 mg | Freq: Every day | RECTAL | Status: DC

## 2011-12-13 MED ORDER — LACTATED RINGERS IV SOLN: INTRAVENOUS | Status: DC

## 2011-12-13 MED ORDER — ROCURONIUM BROMIDE 100 MG/10ML IV SOLN
INTRAVENOUS | Status: DC | PRN
  Administered 2011-12-13: 100 mg via INTRAVENOUS

## 2011-12-13 MED ORDER — CHLORHEXIDINE GLUCONATE CLOTH 2 % EX PADS
6.0000 | MEDICATED_PAD | Freq: Every day | CUTANEOUS | Status: AC
  Administered 2011-12-14 – 2011-12-17 (×4): 6 via TOPICAL

## 2011-12-13 MED ORDER — SODIUM CHLORIDE 0.45 % IV SOLN
INTRAVENOUS | Status: DC
  Administered 2011-12-13: 20 mL/h via INTRAVENOUS
  Administered 2011-12-14: 12:00:00 via INTRAVENOUS

## 2011-12-13 MED ORDER — SODIUM CHLORIDE 0.9 % IV SOLN: 250.0000 mL | INTRAVENOUS | Status: DC

## 2011-12-13 MED ORDER — ONDANSETRON HCL 4 MG/2ML IJ SOLN
4.0000 mg | Freq: Four times a day (QID) | INTRAMUSCULAR | Status: DC | PRN
  Administered 2011-12-14 – 2011-12-15 (×2): 4 mg via INTRAVENOUS
  Filled 2011-12-13 (×2): qty 2

## 2011-12-13 MED ORDER — FAMOTIDINE IN NACL 20-0.9 MG/50ML-% IV SOLN
20.0000 mg | Freq: Two times a day (BID) | INTRAVENOUS | Status: DC
  Administered 2011-12-13: 20 mg via INTRAVENOUS

## 2011-12-13 MED ORDER — MUPIROCIN 2 % EX OINT
1.0000 "application " | TOPICAL_OINTMENT | Freq: Two times a day (BID) | CUTANEOUS | Status: DC
  Administered 2011-12-14 – 2011-12-16 (×6): 1 via NASAL
  Filled 2011-12-13: qty 22

## 2011-12-13 MED ORDER — METOPROLOL TARTRATE 12.5 MG HALF TABLET
12.5000 mg | ORAL_TABLET | Freq: Two times a day (BID) | ORAL | Status: DC
  Administered 2011-12-14 – 2011-12-16 (×5): 12.5 mg via ORAL
  Filled 2011-12-13 (×7): qty 1

## 2011-12-13 MED ORDER — VECURONIUM BROMIDE 10 MG IV SOLR
INTRAVENOUS | Status: DC | PRN
  Administered 2011-12-13: 2 mg via INTRAVENOUS
  Administered 2011-12-13: 10 mg via INTRAVENOUS
  Administered 2011-12-13 (×2): 2 mg via INTRAVENOUS

## 2011-12-13 MED ORDER — PROTAMINE SULFATE 10 MG/ML IV SOLN
INTRAVENOUS | Status: DC | PRN
  Administered 2011-12-13: 280 mg via INTRAVENOUS

## 2011-12-13 MED ORDER — ACETAMINOPHEN 650 MG RE SUPP
650.0000 mg | RECTAL | Status: AC
  Administered 2011-12-13: 650 mg via RECTAL

## 2011-12-13 MED ORDER — HEPARIN SOD (PORK) LOCK FLUSH 100 UNIT/ML IV SOLN
500.0000 [IU] | INTRAVENOUS | Status: AC | PRN
  Administered 2011-12-13: 500 [IU]

## 2011-12-13 MED ORDER — SODIUM BICARBONATE 8.4 % IV SOLN
INTRAVENOUS | Status: DC
  Filled 2011-12-13: qty 2.5

## 2011-12-13 MED ORDER — BISACODYL 5 MG PO TBEC
10.0000 mg | DELAYED_RELEASE_TABLET | Freq: Every day | ORAL | Status: DC
  Administered 2011-12-14 – 2011-12-16 (×3): 10 mg via ORAL
  Filled 2011-12-13 (×3): qty 2

## 2011-12-13 MED ORDER — POTASSIUM CHLORIDE 10 MEQ/50ML IV SOLN: 10.0000 meq | INTRAVENOUS | Status: AC

## 2011-12-13 MED ORDER — MORPHINE SULFATE 2 MG/ML IJ SOLN
1.0000 mg | INTRAMUSCULAR | Status: AC | PRN
  Administered 2011-12-13 – 2011-12-14 (×2): 2 mg via INTRAVENOUS
  Filled 2011-12-13 (×2): qty 1
  Filled 2011-12-13: qty 2

## 2011-12-13 MED ORDER — THROMBIN 20000 UNITS EX KIT
PACK | CUTANEOUS | Status: DC | PRN
  Administered 2011-12-13: 20000 [IU] via TOPICAL

## 2011-12-13 MED ORDER — ATORVASTATIN CALCIUM 80 MG PO TABS
80.0000 mg | ORAL_TABLET | Freq: Every day | ORAL | Status: DC
  Administered 2011-12-14 – 2011-12-17 (×4): 80 mg via ORAL
  Filled 2011-12-13 (×5): qty 1

## 2011-12-13 MED ORDER — MIDAZOLAM HCL 2 MG/2ML IJ SOLN: 2.0000 mg | INTRAMUSCULAR | Status: DC | PRN

## 2011-12-13 MED ORDER — LACTATED RINGERS IV SOLN
INTRAVENOUS | Status: DC | PRN
  Administered 2011-12-13: 14:00:00 via INTRAVENOUS

## 2011-12-13 MED ORDER — PROPOFOL 10 MG/ML IV EMUL
INTRAVENOUS | Status: DC | PRN
  Administered 2011-12-13: 50 mg via INTRAVENOUS
  Administered 2011-12-13: 100 mg via INTRAVENOUS

## 2011-12-13 MED ORDER — MAGNESIUM SULFATE 40 MG/ML IJ SOLN
4.0000 g | Freq: Once | INTRAMUSCULAR | Status: AC
  Administered 2011-12-13: 4 g via INTRAVENOUS
  Filled 2011-12-13: qty 100

## 2011-12-13 MED ORDER — ALBUMIN HUMAN 5 % IV SOLN
INTRAVENOUS | Status: DC | PRN
  Administered 2011-12-13: 18:00:00 via INTRAVENOUS

## 2011-12-13 MED ORDER — LACTATED RINGERS IV SOLN: 500.0000 mL | Freq: Once | INTRAVENOUS | Status: AC | PRN

## 2011-12-13 MED ORDER — METOPROLOL TARTRATE 1 MG/ML IV SOLN: 2.5000 mg | INTRAVENOUS | Status: DC | PRN

## 2011-12-13 MED ORDER — HEPARIN SODIUM (PORCINE) 1000 UNIT/ML IJ SOLN
INTRAMUSCULAR | Status: DC | PRN
  Administered 2011-12-13: 30000 [IU] via INTRAVENOUS

## 2011-12-13 MED ORDER — LACTATED RINGERS IV SOLN
INTRAVENOUS | Status: DC | PRN
  Administered 2011-12-13: 13:00:00 via INTRAVENOUS

## 2011-12-13 MED ORDER — ALBUMIN HUMAN 5 % IV SOLN
250.0000 mL | INTRAVENOUS | Status: AC | PRN
  Administered 2011-12-13: 250 mL via INTRAVENOUS

## 2011-12-13 MED ORDER — FENTANYL CITRATE 0.05 MG/ML IJ SOLN
INTRAMUSCULAR | Status: DC | PRN
  Administered 2011-12-13 (×7): 250 ug via INTRAVENOUS
  Administered 2011-12-13: 500 ug via INTRAVENOUS

## 2011-12-13 MED ORDER — HEMOSTATIC AGENTS (NO CHARGE) OPTIME
TOPICAL | Status: DC | PRN
  Administered 2011-12-13: 1 via TOPICAL

## 2011-12-13 MED ORDER — PANTOPRAZOLE SODIUM 40 MG PO TBEC
40.0000 mg | DELAYED_RELEASE_TABLET | Freq: Every day | ORAL | Status: DC
  Administered 2011-12-15 – 2011-12-16 (×2): 40 mg via ORAL
  Filled 2011-12-13 (×2): qty 1

## 2011-12-13 MED ORDER — SODIUM CHLORIDE 0.9 % IJ SOLN: 3.0000 mL | INTRAMUSCULAR | Status: DC | PRN

## 2011-12-13 MED ORDER — ACETAMINOPHEN 160 MG/5ML PO SOLN: 650.0000 mg | ORAL | Status: AC

## 2011-12-13 MED ORDER — MIDAZOLAM HCL 5 MG/5ML IJ SOLN
INTRAMUSCULAR | Status: DC | PRN
  Administered 2011-12-13 (×5): 2 mg via INTRAVENOUS
  Administered 2011-12-13: 4 mg via INTRAVENOUS

## 2011-12-13 MED ORDER — SODIUM CHLORIDE 0.9 % IJ SOLN
3.0000 mL | Freq: Two times a day (BID) | INTRAMUSCULAR | Status: DC
  Administered 2011-12-14 – 2011-12-16 (×4): 3 mL via INTRAVENOUS

## 2011-12-13 MED ORDER — ASPIRIN EC 325 MG PO TBEC
325.0000 mg | DELAYED_RELEASE_TABLET | Freq: Every day | ORAL | Status: DC
  Administered 2011-12-14 – 2011-12-16 (×3): 325 mg via ORAL
  Filled 2011-12-13 (×3): qty 1

## 2011-12-13 MED ORDER — POTASSIUM CHLORIDE 20 MEQ PO PACK
40.0000 meq | PACK | Freq: Once | ORAL | Status: DC
  Filled 2011-12-13: qty 2

## 2011-12-13 MED ORDER — MIDAZOLAM HCL 2 MG/2ML IJ SOLN
INTRAMUSCULAR | Status: AC
  Filled 2011-12-13: qty 2

## 2011-12-13 MED ORDER — PHENYLEPHRINE HCL 10 MG/ML IJ SOLN
0.0000 ug/min | INTRAMUSCULAR | Status: DC
  Filled 2011-12-13: qty 2

## 2011-12-13 MED ORDER — DEXTROSE 5 % IV SOLN
1.5000 g | Freq: Two times a day (BID) | INTRAVENOUS | Status: AC
  Administered 2011-12-13 – 2011-12-15 (×4): 1.5 g via INTRAVENOUS
  Filled 2011-12-13 (×4): qty 1.5

## 2011-12-13 MED ORDER — HEMOSTATIC AGENTS (NO CHARGE) OPTIME
TOPICAL | Status: DC | PRN
  Administered 2011-12-13: 2 via TOPICAL

## 2011-12-13 MED ORDER — NITROGLYCERIN IN D5W 200-5 MCG/ML-% IV SOLN: 0.0000 ug/min | INTRAVENOUS | Status: DC

## 2011-12-13 MED ORDER — MIDAZOLAM HCL 2 MG/2ML IJ SOLN
1.0000 mg | INTRAMUSCULAR | Status: DC | PRN
  Administered 2011-12-13: 1.5 mg via INTRAVENOUS
  Administered 2011-12-13: 0.5 mg via INTRAVENOUS

## 2011-12-13 MED ORDER — MORPHINE SULFATE 2 MG/ML IJ SOLN
2.0000 mg | INTRAMUSCULAR | Status: DC | PRN
  Administered 2011-12-14: 2 mg via INTRAVENOUS
  Administered 2011-12-14: 4 mg via INTRAVENOUS
  Administered 2011-12-14 (×2): 2 mg via INTRAVENOUS
  Administered 2011-12-14: 4 mg via INTRAVENOUS
  Administered 2011-12-15 – 2011-12-16 (×4): 2 mg via INTRAVENOUS
  Filled 2011-12-13 (×6): qty 1
  Filled 2011-12-13 (×2): qty 2

## 2011-12-13 MED ORDER — INSULIN REGULAR BOLUS VIA INFUSION
0.0000 [IU] | Freq: Three times a day (TID) | INTRAVENOUS | Status: DC
  Filled 2011-12-13: qty 10

## 2011-12-13 MED ORDER — ACETAMINOPHEN 500 MG PO TABS
1000.0000 mg | ORAL_TABLET | Freq: Four times a day (QID) | ORAL | Status: DC
  Administered 2011-12-14 – 2011-12-16 (×9): 1000 mg via ORAL
  Filled 2011-12-13 (×12): qty 2

## 2011-12-13 SURGICAL SUPPLY — 102 items
ADH SKN CLS APL DERMABOND .7 (GAUZE/BANDAGES/DRESSINGS) ×1
ATTRACTOMAT 16X20 MAGNETIC DRP (DRAPES) ×2 IMPLANT
BAG DECANTER FOR FLEXI CONT (MISCELLANEOUS) ×2 IMPLANT
BANDAGE ELASTIC 4 VELCRO ST LF (GAUZE/BANDAGES/DRESSINGS) ×2 IMPLANT
BANDAGE ELASTIC 6 VELCRO ST LF (GAUZE/BANDAGES/DRESSINGS) ×2 IMPLANT
BANDAGE GAUZE ELAST BULKY 4 IN (GAUZE/BANDAGES/DRESSINGS) ×2 IMPLANT
BASKET HEART (ORDER IN 25'S) (MISCELLANEOUS) ×1
BASKET HEART (ORDER IN 25S) (MISCELLANEOUS) ×1 IMPLANT
BLADE STERNUM SYSTEM 6 (BLADE) ×2 IMPLANT
CANISTER SUCTION 2500CC (MISCELLANEOUS) ×2 IMPLANT
CATH ROBINSON RED A/P 18FR (CATHETERS) ×4 IMPLANT
CATH THORACIC 28FR (CATHETERS) ×2 IMPLANT
CATH THORACIC 28FR RT ANG (CATHETERS) IMPLANT
CATH THORACIC 36FR (CATHETERS) ×2 IMPLANT
CATH THORACIC 36FR RT ANG (CATHETERS) ×2 IMPLANT
CLIP RETRACTION 3.0MM CORONARY (MISCELLANEOUS) ×2 IMPLANT
CLIP TI MEDIUM 24 (CLIP) IMPLANT
CLIP TI WIDE RED SMALL 24 (CLIP) ×4 IMPLANT
CLOTH BEACON ORANGE TIMEOUT ST (SAFETY) ×2 IMPLANT
COVER SURGICAL LIGHT HANDLE (MISCELLANEOUS) ×4 IMPLANT
CRADLE DONUT ADULT HEAD (MISCELLANEOUS) ×2 IMPLANT
DERMABOND ADVANCED (GAUZE/BANDAGES/DRESSINGS) ×1
DERMABOND ADVANCED .7 DNX12 (GAUZE/BANDAGES/DRESSINGS) ×1 IMPLANT
DRAPE CARDIOVASCULAR INCISE (DRAPES) ×2
DRAPE SLUSH MACHINE 52X66 (DRAPES) IMPLANT
DRAPE SLUSH/WARMER DISC (DRAPES) IMPLANT
DRAPE SRG 135X102X78XABS (DRAPES) ×1 IMPLANT
DRSG COVADERM 4X14 (GAUZE/BANDAGES/DRESSINGS) ×2 IMPLANT
ELECT CAUTERY BLADE 6.4 (BLADE) ×2 IMPLANT
ELECT REM PT RETURN 9FT ADLT (ELECTROSURGICAL) ×4
ELECTRODE REM PT RTRN 9FT ADLT (ELECTROSURGICAL) ×2 IMPLANT
GLOVE BIO SURGEON STRL SZ 6 (GLOVE) IMPLANT
GLOVE BIO SURGEON STRL SZ 6.5 (GLOVE) ×8 IMPLANT
GLOVE BIO SURGEON STRL SZ7 (GLOVE) IMPLANT
GLOVE BIO SURGEON STRL SZ7.5 (GLOVE) ×10 IMPLANT
GLOVE BIOGEL PI IND STRL 6 (GLOVE) IMPLANT
GLOVE BIOGEL PI IND STRL 6.5 (GLOVE) IMPLANT
GLOVE BIOGEL PI IND STRL 7.0 (GLOVE) IMPLANT
GLOVE BIOGEL PI INDICATOR 6 (GLOVE)
GLOVE BIOGEL PI INDICATOR 6.5 (GLOVE)
GLOVE BIOGEL PI INDICATOR 7.0 (GLOVE)
GLOVE EUDERMIC 7 POWDERFREE (GLOVE) ×4 IMPLANT
GLOVE ORTHO TXT STRL SZ7.5 (GLOVE) IMPLANT
GOWN PREVENTION PLUS XLARGE (GOWN DISPOSABLE) ×6 IMPLANT
GOWN STRL NON-REIN LRG LVL3 (GOWN DISPOSABLE) ×10 IMPLANT
HEMOSTAT POWDER SURGIFOAM 1G (HEMOSTASIS) ×6 IMPLANT
HEMOSTAT SURGICEL 2X14 (HEMOSTASIS) ×2 IMPLANT
INSERT FOGARTY 61MM (MISCELLANEOUS) IMPLANT
INSERT FOGARTY XLG (MISCELLANEOUS) IMPLANT
KIT BASIN OR (CUSTOM PROCEDURE TRAY) ×2 IMPLANT
KIT CATH CPB BARTLE (MISCELLANEOUS) ×2 IMPLANT
KIT ROOM TURNOVER OR (KITS) ×2 IMPLANT
KIT SUCTION CATH 14FR (SUCTIONS) ×2 IMPLANT
KIT VASOVIEW W/TROCAR VH 2000 (KITS) ×2 IMPLANT
NS IRRIG 1000ML POUR BTL (IV SOLUTION) ×12 IMPLANT
PACK OPEN HEART (CUSTOM PROCEDURE TRAY) ×2 IMPLANT
PAD ARMBOARD 7.5X6 YLW CONV (MISCELLANEOUS) ×4 IMPLANT
PENCIL BUTTON HOLSTER BLD 10FT (ELECTRODE) ×2 IMPLANT
PUNCH AORTIC ROTATE 4.0MM (MISCELLANEOUS) IMPLANT
PUNCH AORTIC ROTATE 4.5MM 8IN (MISCELLANEOUS) ×2 IMPLANT
PUNCH AORTIC ROTATE 5MM 8IN (MISCELLANEOUS) IMPLANT
SET CARDIOPLEGIA MPS 5001102 (MISCELLANEOUS) ×2 IMPLANT
SPONGE GAUZE 4X4 12PLY (GAUZE/BANDAGES/DRESSINGS) ×4 IMPLANT
SPONGE INTESTINAL PEANUT (DISPOSABLE) IMPLANT
SPONGE LAP 18X18 X RAY DECT (DISPOSABLE) ×2 IMPLANT
SPONGE LAP 4X18 X RAY DECT (DISPOSABLE) ×2 IMPLANT
SUT BONE WAX W31G (SUTURE) ×2 IMPLANT
SUT MNCRL AB 4-0 PS2 18 (SUTURE) IMPLANT
SUT PROLENE 3 0 SH DA (SUTURE) IMPLANT
SUT PROLENE 3 0 SH1 36 (SUTURE) ×4 IMPLANT
SUT PROLENE 4 0 RB 1 (SUTURE)
SUT PROLENE 4 0 SH DA (SUTURE) IMPLANT
SUT PROLENE 4-0 RB1 .5 CRCL 36 (SUTURE) IMPLANT
SUT PROLENE 5 0 C 1 36 (SUTURE) IMPLANT
SUT PROLENE 6 0 C 1 30 (SUTURE) IMPLANT
SUT PROLENE 7 0 BV 1 (SUTURE) ×2 IMPLANT
SUT PROLENE 7 0 BV1 MDA (SUTURE) ×2 IMPLANT
SUT PROLENE 8 0 BV175 6 (SUTURE) IMPLANT
SUT SILK  1 MH (SUTURE) ×1
SUT SILK 1 MH (SUTURE) ×1 IMPLANT
SUT SILK 2 0 SH CR/8 (SUTURE) ×2 IMPLANT
SUT STEEL STERNAL CCS#1 18IN (SUTURE) IMPLANT
SUT STEEL SZ 6 DBL 3X14 BALL (SUTURE) IMPLANT
SUT VIC AB 1 CTX 36 (SUTURE) ×2
SUT VIC AB 1 CTX36XBRD ANBCTR (SUTURE) ×2 IMPLANT
SUT VIC AB 2-0 CT1 27 (SUTURE) ×2
SUT VIC AB 2-0 CT1 TAPERPNT 27 (SUTURE) ×1 IMPLANT
SUT VIC AB 2-0 CTX 27 (SUTURE) IMPLANT
SUT VIC AB 3-0 SH 27 (SUTURE)
SUT VIC AB 3-0 SH 27X BRD (SUTURE) IMPLANT
SUT VIC AB 3-0 X1 27 (SUTURE) ×2 IMPLANT
SUT VICRYL 4-0 PS2 18IN ABS (SUTURE) IMPLANT
SUTURE E-PAK OPEN HEART (SUTURE) ×2 IMPLANT
SYSTEM SAHARA CHEST DRAIN ATS (WOUND CARE) ×2 IMPLANT
TAPE CLOTH SURG 4X10 WHT LF (GAUZE/BANDAGES/DRESSINGS) ×2 IMPLANT
TOWEL OR 17X24 6PK STRL BLUE (TOWEL DISPOSABLE) ×2 IMPLANT
TOWEL OR 17X26 10 PK STRL BLUE (TOWEL DISPOSABLE) ×2 IMPLANT
TRAY FOLEY IC TEMP SENS 14FR (CATHETERS) ×2 IMPLANT
TUBE SUCT INTRACARD DLP 20F (MISCELLANEOUS) ×2 IMPLANT
TUBING INSUFFLATION 10FT LAP (TUBING) ×2 IMPLANT
UNDERPAD 30X30 INCONTINENT (UNDERPADS AND DIAPERS) ×2 IMPLANT
WATER STERILE IRR 1000ML POUR (IV SOLUTION) ×4 IMPLANT

## 2011-12-13 NOTE — Anesthesia Preprocedure Evaluation (Addendum)
Anesthesia Evaluation  Patient identified by MRN, date of birth, ID band Patient awake    Reviewed: Allergy & Precautions, H&P , NPO status , Patient's Chart, lab work & pertinent test results  Airway Mallampati: II      Dental   Pulmonary shortness of breath and with exertion, asthma , sleep apnea and Continuous Positive Airway Pressure Ventilation , COPD breath sounds clear to auscultation        Cardiovascular + CAD and +CHF Rhythm:Regular Rate:Normal     Neuro/Psych    GI/Hepatic negative GI ROS, Neg liver ROS,   Endo/Other  negative endocrine ROSDiabetes mellitus-, Type 2  Renal/GU      Musculoskeletal   Abdominal   Peds  Hematology negative hematology ROS (+)   Anesthesia Other Findings   Reproductive/Obstetrics                           Anesthesia Physical Anesthesia Plan  ASA: IV  Anesthesia Plan: General   Post-op Pain Management:    Induction: Intravenous  Airway Management Planned: Oral ETT  Additional Equipment: Arterial line and PA Cath  Intra-op Plan:   Post-operative Plan: Post-operative intubation/ventilation  Informed Consent: I have reviewed the patients History and Physical, chart, labs and discussed the procedure including the risks, benefits and alternatives for the proposed anesthesia with the patient or authorized representative who has indicated his/her understanding and acceptance.     Plan Discussed with: CRNA  Anesthesia Plan Comments:         Anesthesia Quick Evaluation

## 2011-12-13 NOTE — OR Nursing (Signed)
Unit [address] contacted at removal of retractor at 18:11, skin sutures at 18:43 and patient exit.

## 2011-12-13 NOTE — OR Nursing (Signed)
OR front desk contacted to notify family (volunteers desk closed for evening) that patient is off pump and retractor removed at 18:11.

## 2011-12-13 NOTE — Anesthesia Procedure Notes (Signed)
Procedure Name: Intubation Date/Time: 12/13/2011 1:51 PM Performed by: Leona Singleton A Pre-anesthesia Checklist: Patient identified Patient Re-evaluated:Patient Re-evaluated prior to inductionOxygen Delivery Method: Circle system utilized Preoxygenation: Pre-oxygenation with 100% oxygen Intubation Type: IV induction Ventilation: Mask ventilation without difficulty Laryngoscope Size: Miller and 2 Grade View: Grade IV Tube type: Oral Tube size: 7.5 mm Number of attempts: 2 Airway Equipment and Method: Stylet and Video-laryngoscopy Placement Confirmation: ETT inserted through vocal cords under direct vision,  positive ETCO2 and breath sounds checked- equal and bilateral Secured at: 22 cm Tube secured with: Tape Dental Injury: Teeth and Oropharynx as per pre-operative assessment  Difficulty Due To: Difficult Airway- due to anterior larynx, Difficult Airway- due to large tongue and Difficulty was anticipated Future Recommendations: Recommend- induction with short-acting agent, and alternative techniques readily available Comments: EZ mask after induction. DL x1 Mil 2, Grade 4 view. DL x1 Glidescope. Glottic opening easily visualized; ETT pass with ease.

## 2011-12-13 NOTE — Progress Notes (Signed)
Patient ID: Jamie [name], female   DOB: 09/01/1945, 66 y.o.   MRN: [id number]    SUBJECTIVE:Nervous about surgery this afternoon.  Otherwise feeling ok. Diuresed well yesterday with IV Lasix.      Marland Kitchen albuterol  2.5 mg Nebulization Once  . aspirin  81 mg Oral Daily  . atorvastatin  80 mg Oral q1800  . bisacodyl  5 mg Oral Once  . bisacodyl  10 mg Rectal Once  . bisoprolol  2.5 mg Oral Daily  . cefUROXime (ZINACEF)  IV  1.5 g Intravenous To OR  . cefUROXime (ZINACEF)  IV  750 mg Intravenous To OR  . chlorhexidine  60 mL Topical Once  . chlorhexidine  60 mL Topical Once  . chlorhexidine  60 mL Topical Once  . Chlorhexidine Gluconate Cloth  6 each Topical q morning - 10a  . dexmedetomidine (PRECEDEX) IV infusion for high rates  0.1-0.7 mcg/kg/hr Intravenous To OR  . diazepam  5 mg Oral Once  . metoCLOPramide  10 mg Oral TID AC   And  . diphenhydrAMINE  12.5 mg Oral TID AC  . docusate sodium  200 mg Oral QHS  . DOPamine  2-20 mcg/kg/min Intravenous To OR  . epinephrine  0.5-20 mcg/min Intravenous To OR  . furosemide  20 mg Intravenous Q12H  . gabapentin  300 mg Oral TID  . heparin  5,000 Units Subcutaneous Q8H  . insulin aspart  0-20 Units Subcutaneous TID WC  . insulin aspart  0-5 Units Subcutaneous QHS  . insulin (NOVOLIN-R) infusion   Intravenous To OR  . loratadine  10 mg Oral Daily  . magnesium sulfate  40 mEq Other To OR  . metoprolol tartrate  12.5 mg Oral Once  . mupirocin ointment  1 application Nasal BID  . nitroGLYCERIN  0.5 inch Topical Q6H  . nitroGLYCERIN  2-200 mcg/min Intravenous To OR  . nitroglycerin-nicardipine-HEPARIN-sodium bicarbonate irrigation for artery spasm   Irrigation To OR  . phenylephrine (NEO-SYNEPHRINE) Adult infusion  30-200 mcg/min Intravenous To OR  . polyethylene glycol  17 g Oral QHS  . potassium chloride  80 mEq Other To OR  . potassium chloride  40 mEq Oral Once  . potassium chloride  40 mEq Oral Once  . sodium chloride  500 mL  Intravenous Once  . sodium chloride  3 mL Intravenous Q12H  . tiotropium  18 mcg Inhalation Daily  . tranexamic acid  15 mg/kg Intravenous To OR  . tranexamic acid  2 mg/kg Intracatheter To OR  . tranexamic acid (CYKLOKAPRON) infusion ([facility])  1.5 mg/kg/hr Intravenous To OR  . vancomycin  1,500 mg Intravenous To OR  . DISCONTD: furosemide  40 mg Intravenous Q12H  . DISCONTD: vancomycin  1,250 mg Intravenous To OR      Filed Vitals:   12/13/11 0400 12/13/11 0500 12/13/11 0600 12/13/11 0700  BP:      Pulse: 70 76 75 77  Temp:      TempSrc:      Resp:      Height:      Weight:   266 lb 8.6 oz (120.9 kg)   SpO2: 92% 94% 93% 94%    Intake/Output Summary (Last 24 hours) at 12/13/11 0728 Last data filed at 12/13/11 0611  Gross per 24 hour  Intake    852 ml  Output   3551 ml  Net  -2699 ml    LABS: Basic Metabolic Panel:  Basename 12/13/11 0500 12/12/11 0900  NA 137  138  K 3.6 3.1*  CL 98 97  CO2 29 28  GLUCOSE 148* 173*  BUN 24* 24*  CREATININE 0.77 0.70  CALCIUM 9.2 9.1  MG -- --  PHOS -- --   Liver Function Tests:  Basename 12/12/11 0900  AST 48*  ALT 47*  ALKPHOS 67  BILITOT 0.5  PROT 6.7  ALBUMIN 3.3*   No results found for this basename: LIPASE:2,AMYLASE:2 in the last 72 hours CBC:  Basename 12/13/11 0500 12/11/11 1624  WBC 6.3 7.7  NEUTROABS -- --  HGB 12.2 12.2  HCT 38.3 37.4  MCV 88.2 87.8  PLT 160 138*   Cardiac Enzymes:  Basename 12/10/11 1835 12/10/11 1150 12/10/11 0805  CKTOTAL 91 92 93  CKMB 7.8* 9.2* 9.6*  CKMBINDEX -- -- --  TROPONINI 1.21* 1.38* --   BNP: No components found with this basename: POCBNP:3 D-Dimer:  Basename 12/10/11 1030  DDIMER 1.47*   Hemoglobin A1C: No results found for this basename: HGBA1C in the last 72 hours Fasting Lipid Panel: No results found for this basename: CHOL,HDL,LDLCALC,TRIG,CHOLHDL,LDLDIRECT in the last 72 hours Thyroid Function Tests: No results found for this basename:  TSH,T4TOTAL,FREET3,T3FREE,THYROIDAB in the last 72 hours Anemia Panel: No results found for this basename: VITAMINB12,FOLATE,FERRITIN,TIBC,IRON,RETICCTPCT in the last 72 hours  RADIOLOGY: Dg Chest 2 View  12/10/2011  *RADIOLOGY REPORT*  Clinical Data: Shortness of breath; history of breast cancer.  CHEST - 2 VIEW  Comparison: Chest radiograph performed 12/02/2011  Findings: The lungs are well-aerated.  Vascular congestion is noted.  Mild left basilar airspace opacity likely reflects atelectasis.  There is no definite evidence for significant pulmonary edema.  There is no evidence of pleural effusion or pneumothorax.  The heart is borderline normal in size; the mediastinal contour is within normal limits.  No acute osseous abnormalities are seen.  A right-sided chest port is noted ending about the distal SVC. Bilateral breast implants are seen.  IMPRESSION: Vascular congestion noted; mild left basilar airspace opacity likely reflects atelectasis, though pneumonia could conceivably have a similar appearance.  Original Report Authenticated By: Tonia Ghent, M.D.   Ct Angio Chest W/cm &/or Wo Cm  12/10/2011  *RADIOLOGY REPORT*  Clinical Data: Bilateral mastectomy 2 weeks ago for breast cancer. Short of breath.  CT ANGIOGRAPHY CHEST  Technique:  Multidetector CT imaging of the chest using the standard protocol during bolus administration of intravenous contrast. Multiplanar reconstructed images including MIPs were obtained and reviewed to evaluate the vascular anatomy.  Contrast: OMNIPAQUE IOHEXOL 300 MG/ML  SOLN  Comparison: Chest x-ray 12/10/2011  Findings: Negative for pulmonary embolism.  No aortic aneurysm or dissection.  Mild atherosclerotic calcification of the coronary arteries.  No pericardial effusion.  Mild bibasilar atelectasis.  No significant pleural effusion. Negative for mass or pneumonia.  No adenopathy.  Bilateral breast reconstruction is noted.  Fatty infiltration of liver.  Gastric  banding for obesity.  IMPRESSION: Negative for pulmonary embolism.  Mild bibasilar atelectasis.  Original Report Authenticated By: Camelia Phenes, M.D.   Dg Chest Port 1 View  12/02/2011  *RADIOLOGY REPORT*  Clinical Data: Port placement.  PORTABLE CHEST - 1 VIEW  Comparison: 10/14/2011  Findings: Right Port-A-Cath is in place with the tip in the SVC. No pneumothorax.  Cardiomegaly.  Mild peribronchial thickening and bibasilar atelectasis.  No effusions.  IMPRESSION: Right Port-A-Cath tip in the SVC.  No pneumothorax.  Original Report Authenticated By: Cyndie Chime, M.D.   Dg Fluoro Guide Cv Line-no Report  12/02/2011  CLINICAL DATA: Port a cath   FLOURO GUIDE CV LINE  Fluoroscopy was utilized by the requesting physician.  No radiographic  interpretation.      PHYSICAL EXAM General: NAD Neck: No JVD, no thyromegaly or thyroid nodule.  Lungs: Clear to auscultation bilaterally with normal respiratory effort. CV: Nondisplaced PMI.  Heart regular S1/S2, no S3/S4, no murmur.  No peripheral edema.  No carotid bruit.  Normal pedal pulses.  Abdomen: Soft, nontender, no hepatosplenomegaly, no distention.  Neurologic: Alert and oriented x 3.  Psych: Normal affect. Extremities: No clubbing or cyanosis.   TELEMETRY: Reviewed telemetry pt in NSR  ASSESSMENT AND PLAN:  66 yo with history of breast cancer developed CHF/NSTEMI and was found to have occluded RCA and severe left main stenosis on left heart cath.  1. CAD: Plan for CABG today.  Continue ASA 81, statin, bisoprolol.  2. Diastolic CHF: Good diuresis with IV Lasix yesterday. LVEDP was 26 mmHg at cath.  She appears euvolemic today.  3. Breast cancer.  Plan to delay chemotherapy until after CABG and recovery.   [name] 12/13/2011 7:30 AM

## 2011-12-13 NOTE — Procedures (Signed)
Extubation Procedure Note  Patient Details:   Name: Jamie [name] DOB: 1946/01/19 MRN: [id number]   Airway Documentation:  AIRWAYS (Active)     Airway 7.5 mm (Active)  Secured at (cm) 22 cm 12/13/2011  7:38 PM  Measured From Lips 12/13/2011  7:38 PM  Secured Location Right 12/13/2011  7:38 PM  Secured By Pink Tape 12/13/2011  7:38 PM  Site Condition Dry 12/13/2011  7:38 PM    Evaluation  O2 sats: stable throughout Complications: No apparent complications Patient did tolerate procedure well. Bilateral Breath Sounds: Diminished   Yes  Ave Filter 12/13/2011, 11:56 PM

## 2011-12-13 NOTE — Progress Notes (Signed)
[facility]   Subjective: In bed and multiple family members at bedside. Anxious regarding upcoming surgery but in good spirits. Had good sized BM yesterday after suppository.  Objective: Vital signs in last 24 hours: Temp:  [97.6 F (36.4 C)-98.5 F (36.9 C)] 98.1 F (36.7 C) (05/10 0734) Pulse Rate:  [68-80] 73  (05/10 0734) Resp:  [16-18] 16  (05/10 0734) BP: (103-119)/(46-56) 118/53 mmHg (05/10 0734) SpO2:  [92 %-96 %] 95 % (05/10 0844) Weight:  [120.9 kg (266 lb 8.6 oz)] 120.9 kg (266 lb 8.6 oz) (05/10 0600) Weight change: -3 kg (-6 lb 9.8 oz) Last BM Date: 12/12/11  Intake/Output from previous day: 05/09 0701 - 05/10 0700 In: 852 [P.O.:810; IV Piggyback:42] Out: 3551 [Urine:3550; Stool:1] Intake/Output this shift:    General appearance: alert, cooperative, appears stated age, mild distress as evidenced by anxiety related to sensation of constipation and moderately obese Resp: Coarse but clear to auscultation bilaterally, room air with saturations 95% Cardio: regular rate and sinus rhythm, S1, S2 normal, no murmur, click, rub or gallop GI: soft, non-tender; bowel sounds normal; no masses,  no organomegaly Extremities: extremities normal, atraumatic, no cyanosis or edema Neurologic: Grossly normal-anxious  Lab Results:  Basename 12/13/11 0500 12/11/11 1624  WBC 6.3 7.7  HGB 12.2 12.2  HCT 38.3 37.4  PLT 160 138*   BMET  Basename 12/13/11 0500 12/12/11 0900  NA 137 138  K 3.6 3.1*  CL 98 97  CO2 29 28  GLUCOSE 148* 173*  BUN 24* 24*  CREATININE 0.77 0.70  CALCIUM 9.2 9.1    Studies/Results: No results found.  Medications: I have reviewed the patient's current medications.  Assessment/Plan:  Principal Problem:  *CAD (coronary artery disease), native coronary artery-2 vessel *CABG planned for today 12/13/2011 by Dr. Laneta Simmers *Currently stable without any acute ischemic symptoms described *Continue aspirin, statin, subcutaneous heparin, topical  nitroglycerin and beta blocker  Active Problems:  Acute diastolic CHF (congestive heart failure)-grade 1 *Appears euvolemic therefore we'll decreases Lasix dosage yesterday-cardiology also recommends we can likely discontinue Lasix soon- notes she appears euvolemic   COPD (chronic obstructive pulmonary disease) *Compensated and no wheeze *Continue supportive care with when necessary nebulizers   Constipation *Continue MiraLax and Reglan *Great results after suppository   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 *Oncology would like to avoid cardiotoxic drugs so she will receive Cytoxan and Taxotere. *He also plans on notifying Dr. Kelly Splinter (plastic surgery) as the patient can be scheduled for further expander inflation as appropriate *Plan is to start letrozole sometime in the next week *Already has outpatient appointment scheduled with Dr. Darnelle Catalan on May 28   Hyperlipidemia *Continue statin *LDL cholesterol slightly elevated this admission at 108   Obesity  Disposition *Remain in step down until CABG  *Postoperatively she will be in the cardiovascular intensive care unit and expect the cardiothoracic surgeons to assume primary care of this patient    LOS: 4 days   Junious Silk, ANP pager [phone]  [facility] [email or web address] Password: [email or web address]  12/13/2011,  10:43 AM   I have examined the above note and reviewed the chart. I agree  with the above note.   Calvert Cantor, MD [phone]

## 2011-12-13 NOTE — Anesthesia Postprocedure Evaluation (Signed)
  Anesthesia Post-op Note  Patient: Jamie [name]  Procedure(s) Performed: Procedure(s) (LRB): CORONARY ARTERY BYPASS GRAFTING (CABG) (N/A)  Patient Location: ICU  Anesthesia Type: General  Level of Consciousness: unresponsive  Airway and Oxygen Therapy: Patient remains intubated per anesthesia plan  Post-op Pain: none  Post-op Assessment: Post-op Vital signs reviewed, Patient's Cardiovascular Status Stable and Respiratory Function Stable  Post-op Vital Signs: Reviewed and stable  Complications: No apparent anesthesia complications

## 2011-12-13 NOTE — Brief Op Note (Signed)
                   [address]            [place] [address]          [phone]    12/09/2011 - 12/13/2011  5:32 PM  PATIENT:  Jamie [name]  66 y.o. female  PRE-OPERATIVE DIAGNOSIS:   Coronary Artery Disease  POST-OPERATIVE DIAGNOSIS:  Coronary Artery Disease  PROCEDURE:  Procedure(s): CORONARY ARTERY BYPASS GRAFTING (CABG)X4 ( LIMA-LAD; SEQSVG-DIAG-OM; SVG -RCA) EVH LEFT LEG, RIGHT EXPLORED AT [facility]  SURGEON:  Surgeon(s): Alleen Borne, MD  PHYSICIAN ASSISTANT: Karol Liendo PA-C  ANESTHESIA:   general  PATIENT CONDITION:  ICU - intubated and hemodynamically stable.  PRE-OPERATIVE WEIGHT: 124kg  COMPLICATIONS: NO KNOWN

## 2011-12-13 NOTE — Preoperative (Signed)
Beta Blockers   Reason not to administer Beta Blockers:Not Applicable 

## 2011-12-13 NOTE — Transfer of Care (Signed)
Immediate Anesthesia Transfer of Care Note  Patient: Jamie [name]  Procedure(s) Performed: Procedure(s) (LRB): CORONARY ARTERY BYPASS GRAFTING (CABG) (N/A)  Patient Location: PACU and SICU  Anesthesia Type: General  Level of Consciousness: unresponsive  Airway & Oxygen Therapy: Patient remains intubated per anesthesia plan and Patient placed on Ventilator (see vital sign flow sheet for setting)  Post-op Assessment: Report given to PACU RN and Post -op Vital signs reviewed and stable  Post vital signs: Reviewed and stable  Complications: No apparent anesthesia complications

## 2011-12-14 ENCOUNTER — Inpatient Hospital Stay (HOSPITAL_COMMUNITY): Payer: Medicare Other

## 2011-12-14 LAB — BASIC METABOLIC PANEL
BUN: 18 mg/dL (ref 6–23)
Creatinine, Ser: 0.64 mg/dL (ref 0.50–1.10)
GFR calc Af Amer: >90 mL/min (ref >90)
GFR calc non Af Amer: >90 mL/min (ref >90)
Potassium: 4.4 mEq/L (ref 3.5–5.1)

## 2011-12-14 LAB — CBC
HCT: 33.5 % — ABNORMAL LOW (ref 36.0–46.0)
Hemoglobin: 10.8 g/dL — ABNORMAL LOW (ref 12.0–15.0)
MCHC: 32.2 g/dL (ref 30.0–36.0)
MCHC: 31.7 g/dL (ref 30.0–36.0)
MCV: 87.7 fL (ref 78.0–100.0)
RDW: 13.7 % (ref 11.5–15.5)
RDW: 13.8 % (ref 11.5–15.5)
WBC: 8.7 10*3/uL (ref 4.0–10.5)

## 2011-12-14 LAB — CREATININE, SERUM
Creatinine, Ser: 0.72 mg/dL (ref 0.50–1.10)
GFR calc Af Amer: >90 mL/min (ref >90)
GFR calc non Af Amer: 87 mL/min — ABNORMAL LOW (ref >90)

## 2011-12-14 LAB — POCT I-STAT, CHEM 8
Calcium, Ion: 1.17 mmol/L (ref 1.12–1.32)
Chloride: 101 mEq/L (ref 96–112)
HCT: 34 % — ABNORMAL LOW (ref 36.0–46.0)
TCO2: 26 mmol/L (ref 0–100)

## 2011-12-14 LAB — PREPARE PLATELET PHERESIS

## 2011-12-14 LAB — GLUCOSE, CAPILLARY
Glucose-Capillary: 114 mg/dL — ABNORMAL HIGH (ref 70–99)
Glucose-Capillary: 125 mg/dL — ABNORMAL HIGH (ref 70–99)
Glucose-Capillary: 128 mg/dL — ABNORMAL HIGH (ref 70–99)
Glucose-Capillary: 135 mg/dL — ABNORMAL HIGH (ref 70–99)
Glucose-Capillary: 140 mg/dL — ABNORMAL HIGH (ref 70–99)
Glucose-Capillary: 140 mg/dL — ABNORMAL HIGH (ref 70–99)
Glucose-Capillary: 142 mg/dL — ABNORMAL HIGH (ref 70–99)
Glucose-Capillary: 154 mg/dL — ABNORMAL HIGH (ref 70–99)
Glucose-Capillary: 161 mg/dL — ABNORMAL HIGH (ref 70–99)
Glucose-Capillary: 186 mg/dL — ABNORMAL HIGH (ref 70–99)
Glucose-Capillary: 209 mg/dL — ABNORMAL HIGH (ref 70–99)

## 2011-12-14 LAB — POCT I-STAT 3, ART BLOOD GAS (G3+)
O2 Saturation: 92 %
Patient temperature: 36.7
TCO2: 27 mmol/L (ref 0–100)
pCO2 arterial: 46.7 mmHg — ABNORMAL HIGH (ref 35.0–45.0)
pO2, Arterial: 66 mmHg — ABNORMAL LOW (ref 80.0–100.0)

## 2011-12-14 LAB — MAGNESIUM: Magnesium: 2.6 mg/dL — ABNORMAL HIGH (ref 1.5–2.5)

## 2011-12-14 MED ORDER — INSULIN ASPART 100 UNIT/ML ~~LOC~~ SOLN
0.0000 [IU] | SUBCUTANEOUS | Status: DC
  Administered 2011-12-14 (×2): 8 [IU] via SUBCUTANEOUS
  Administered 2011-12-14 – 2011-12-15 (×2): 2 [IU] via SUBCUTANEOUS
  Administered 2011-12-15: 4 [IU] via SUBCUTANEOUS
  Administered 2011-12-15: 2 [IU] via SUBCUTANEOUS
  Administered 2011-12-15: 4 [IU] via SUBCUTANEOUS
  Administered 2011-12-15 – 2011-12-16 (×3): 2 [IU] via SUBCUTANEOUS
  Administered 2011-12-16: 1 [IU] via SUBCUTANEOUS

## 2011-12-14 MED ORDER — INSULIN GLARGINE 100 UNIT/ML ~~LOC~~ SOLN
30.0000 [IU] | Freq: Every day | SUBCUTANEOUS | Status: DC
  Administered 2011-12-14: 30 [IU] via SUBCUTANEOUS

## 2011-12-14 MED ORDER — POTASSIUM CHLORIDE CRYS ER 20 MEQ PO TBCR
20.0000 meq | EXTENDED_RELEASE_TABLET | Freq: Two times a day (BID) | ORAL | Status: AC
  Administered 2011-12-14 (×2): 20 meq via ORAL
  Filled 2011-12-14 (×2): qty 1

## 2011-12-14 MED ORDER — INSULIN GLARGINE 100 UNIT/ML ~~LOC~~ SOLN
20.0000 [IU] | Freq: Every day | SUBCUTANEOUS | Status: DC
  Administered 2011-12-14 – 2011-12-16 (×3): 20 [IU] via SUBCUTANEOUS

## 2011-12-14 MED ORDER — INSULIN GLARGINE 100 UNIT/ML ~~LOC~~ SOLN: 30.0000 [IU] | SUBCUTANEOUS | Status: DC

## 2011-12-14 MED ORDER — ENOXAPARIN SODIUM 40 MG/0.4ML ~~LOC~~ SOLN
40.0000 mg | Freq: Every day | SUBCUTANEOUS | Status: DC
  Administered 2011-12-14 – 2011-12-17 (×4): 40 mg via SUBCUTANEOUS
  Filled 2011-12-14 (×5): qty 0.4

## 2011-12-14 MED ORDER — FUROSEMIDE 10 MG/ML IJ SOLN
40.0000 mg | Freq: Two times a day (BID) | INTRAMUSCULAR | Status: AC
  Administered 2011-12-14 (×2): 40 mg via INTRAVENOUS
  Filled 2011-12-14 (×2): qty 4

## 2011-12-14 NOTE — Progress Notes (Signed)
1 Day Post-Op Procedure(s) (LRB): CORONARY ARTERY BYPASS GRAFTING (CABG) (N/A) Subjective: No complaints  Objective: Vital signs in last 24 hours: Temp:  [96.8 F (36 C)-99.1 F (37.3 C)] 98.6 F (37 C) (05/11 0900) Pulse Rate:  [72-92] 86  (05/11 0900) Cardiac Rhythm:  [-] Normal sinus rhythm (05/11 0900) Resp:  [8-30] 25  (05/11 0900) BP: (82-140)/(51-61) 140/56 mmHg (05/11 0900) SpO2:  [95 %-100 %] 96 % (05/11 0900) Arterial Line BP: (93-156)/(47-86) 156/68 mmHg (05/11 0900) FiO2 (%):  [39.9 %-50.2 %] 40 % (05/10 2300) Weight:  [125.9 kg (277 lb 9 oz)] 125.9 kg (277 lb 9 oz) (05/11 0600)  Hemodynamic parameters for last 24 hours: PAP: (23-46)/(13-23) 46/23 mmHg CO:  [3.8 L/min-5.5 L/min] 5.4 L/min CI:  [1.7 L/min/m2-2.5 L/min/m2] 2.4 L/min/m2  Intake/Output from previous day: 05/10 0701 - 05/11 0700 In: 5011.7 [I.V.:3805.7; Blood:726; NG/GT:30; IV Piggyback:450] Out: 5912 [Urine:3050; Blood:2542; Chest Tube:320] Intake/Output this shift: Total I/O In: 92.1 [I.V.:92.1] Out: -   General appearance: alert and cooperative Neurologic: intact Heart: regular rate and rhythm, S1, S2 normal, no murmur, click, rub or gallop Lungs: clear to auscultation bilaterally Extremities: edema mild Wound: dressing dry  Lab Results:  Basename 12/14/11 0406 12/13/11 1949 12/13/11 1947  WBC 7.5 -- 7.3  HGB 10.8* 10.2* --  HCT 33.5* 30.0* --  PLT 110* -- 111*   BMET:  Basename 12/14/11 0406 12/13/11 1949 12/13/11 0500  NA 139 138 --  K 4.4 4.1 --  CL 106 -- 98  CO2 24 -- 29  GLUCOSE 154* 146* --  BUN 18 -- 24*  CREATININE 0.64 -- 0.77  CALCIUM 8.1* -- 9.2    PT/INR:  Basename 12/13/11 1947  LABPROT 16.3*  INR 1.29   ABG    Component Value Date/Time   PHART 7.353 12/13/2011 2320   HCO3 26.0* 12/13/2011 2320   TCO2 27 12/13/2011 2320   O2SAT 92.0 12/13/2011 2320   CBG (last 3)   Basename 12/14/11 0652 12/14/11 0549 12/14/11 0502  GLUCAP 140* 128* 137*     Assessment/Plan: S/P Procedure(s) (LRB): CORONARY ARTERY BYPASS GRAFTING (CABG) (N/A) Mobilize Diuresis Diabetes control d/c tubes/lines Continue foley due to diuresing patient   LOS: 5 days    Manus Weedman K 12/14/2011

## 2011-12-14 NOTE — Op Note (Signed)
Jamie [name], WHISTLER                 ACCOUNT NO.:  [id number]  MEDICAL RECORD NO.:  [id number]  LOCATION:  2308                         FACILITY:  [facility]  PHYSICIAN:  Evelene Croon, M.D.     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  OPERATIVE PROCEDURE:  Median sternotomy, extracorporeal circulation, coronary artery bypass graft surgery x4 using a left internal mammary artery graft to the left anterior descending coronary artery, with a sequential saphenous vein graft to the diagonal branch of the LAD and the obtuse marginal branch of the left circumflex coronary artery, and a saphenous vein graft to the right coronary artery.  Endoscopic vein harvesting from the left leg.  ATTENDING SURGEON:  Evelene Croon, MD  ASSISTANT:  Rowe Clack, PA-C  ANESTHESIA:  General endotracheal.  CLINICAL HISTORY:  This patient is a 66 year old woman with a prior history of smoking and COPD, hyperlipidemia, diabetes, and obesity as well as family history of heart disease who underwent bilateral mastectomies with immediate reconstruction on October 18, 2011, for left breast cancer.  She subsequently had a Port-A-Cath placed on December 02, 2011, and has started on her first dose of azithromycin-based chemotherapy on Monday of this week.  She tolerated that well, then went home and later developed acute shortness of breath and chest discomfort radiating into both arms and shoulders.  She ruled in for a non-ST- segment elevation MI.  Cardiac catheterization showed a 75% ostial left main coronary stenosis as well as 80% proximal LAD stenosis.  The diagonal branch came off in the area of this proximal LAD stenosis and also had significant ostial  stenosis itself.  Left circumflex was small, nondominant vessel that had no significant disease in it.  It was only compromised by the left main stenosis.  The right coronary artery was occluded after a large acute marginal with filling of the distal vessel by collaterals from the LAD.  Left ventricular function was normal. After review of the catheterization and examination of the patient, it was felt that coronary artery bypass graft surgery is the best treatment to prevent further ischemia and infarction.  I discussed the operative procedure with the patient and her family including alternatives, benefits, and risks including, but not limited to bleeding, blood transfusion, infection, stroke, myocardial infarction, graft failure, and death.  She understood and agreed to proceed.  OPERATIVE PROCEDURE:  The patient was taken to the operative room and placed on the table in supine position.  After induction of general endotracheal anesthesia, a Foley catheter was placed in the bladder using sterile technique.  Then, the chest, abdomen, and both lower extremities were prepped and draped in usual sterile manner.  The chest was entered through a median sternotomy incision and the pericardium opened in midline.  Examination of the heart showed good ventricular contractility.  The ascending aorta was of normal size and had no palpable plaques in it.  It was relatively short and covered with  fat. There was large amount of mediastinal fat.  Then, the left internal mammary artery was harvested from the chest wall as a pedicle graft.  This was a medium-caliber vessel with excellent blood flow through it.  At the same time, segment of greater saphenous vein was harvested from the left leg using endoscopic vein harvest technique.  This vein was of medium size and good quality.  We initially examined the saphenous vein in the right leg adjacent to the right knee, but this vein was small and not  felt to be ideal and therefore, we harvested the left saphenous vein instead.  Then, the patient was heparinized when an adequate ACT was obtained. The distal ascending aorta was cannulated using a 20-French aortic cannula for arterial inflow.  Venous outflow was achieved using a two- stage venous cannula for the right atrial appendage.  An antegrade cardioplegia and vent cannula was inserted into the aortic root.  The patient was placed on cardiopulmonary bypass and distal coronary arteries were identified.  The LAD was a large graftable vessel with some plaque in the midportion.  The diagonal branch was a moderate-sized graftable vessel.  The obtuse marginal branch was a moderate-sized graftable vessel before its bifurcation into two subbranches.  The right coronary artery was a large graftable vessel.  There was no significant distal disease in it.  Then, the aorta was crossclamped and 800 mL of cold blood antegrade cardioplegia was administered in the aortic root with quick arrest of the heart.  Systemic hypothermia to 20 degrees centigrade and topical hypothermia with iced saline was used.  Temperature probe was placed in the septum and insulating the pad in the pericardium.  First, the distal anastomosis was performed to the right coronary artery.  The internal diameter of this vessel was greater than 2.5 mm. The conduit used was a segment of greater saphenous vein and anastomosis was performed in an end-to-side manner using continuous 7-0 Prolene suture.  Flow was noted through the graft and was excellent.  The second distal anastomosis was performed to the obtuse marginal branch.  The internal diameter of this vessel was about 1.6 mm.  The conduit used was a second segment of greater saphenous vein.  The anastomosis was performed in a end-to-side manner using continuous 7-0 Prolene suture.  Flow was noted through the graft and was excellent.  The third distal anastomosis  was performed to the diagonal branch.  The internal diameter of this vessel was about 1.5-1.6 mm.  The conduit used was a same segment of greater saphenous vein and anastomosis was performed in a sequential side-to-side manner using continuous 7-0 suture.  Flow was noted through the graft and was excellent.  Then, another dose of cardioplegia was given down the vein grafts and in the aortic root.  The fourth distal anastomosis was performed at the distal LAD.  The internal diameter of this vessel was about 1.75 mm.  The conduit used was the left internal mammary graft and was brought through an opening of the left pericardium anterior to the phrenic nerve.  This was anastomosed to the LAD in an end-to-side manner using continuous 8-0 Prolene suture.  The pedicle was sutured to the epicardium with 6-0 Prolene sutures.  The patient was rewarmed to 37 degrees centigrade. Another dose of cardioplegia was given and then the two proximal vein graft anastomoses were performed to the mid-ascending aorta in an end-to- side manner using continuous 6-0 Prolene suture.  Then, the clamp was removed from  the mammary artery pedicle.  There was rapid warming of the ventricular septum and return of spontaneous ventricular fibrillation. The crossclamp was removed with the time of 94 minutes.  There was spontaneous return of sinus rhythm.  The proximal and distal anastomoses appeared hemostatic and allowed the grafts satisfactory.  Graft markers were placed around the proximal anastomoses.  Two temporary right ventricular and right atrial pacing wires were placed and brought out through the skin.  When the patient was rewarmed to 37 degrees centigrade, she was weaned from cardiopulmonary bypass on no inotropic agents.  Total bypass time was 118 minutes.  Cardiac function appeared excellent with the cardiac output of 5 L per minute.  Protamine was given, and the venous and aortic cannulae were removed  without difficulty.  Hemostasis was achieved.  The patient was given 1 unit of platelets due to platelet count of 90,000.  Three chest tubes were placed with tube in the posterior pericardium, one in the left pleural space, and one in the anterior mediastinum.  The sternum was then reapproximated with double #6 stainless steel wires.  The fascia was closed with continuous #1 Vicryl suture.  Subcutaneous tissue was closed with continuous 2-0 Vicryl and the skin with a 3-0 Vicryl subcuticular closure.  The lower extremity vein harvest sites were closed in layers in a similar manner. The sponge, needle, and instrument counts were correct according to the scrub nurse.  Dry sterile dressings were applied over the incisions around the chest tubes, which were hooked to Pleur-Evac suction.  The patient remained hemodynamically stable and was transferred to the SICU in guarded, but stable condition.     Evelene Croon, M.D.     BB/MEDQ  D:  12/13/2011  T:  12/14/2011  Job:  [id number]

## 2011-12-14 NOTE — Progress Notes (Signed)
Patient ID: Jamie [name], female   DOB: [birth year], 66 y.o.   MRN: [id number]   Filed Vitals:   12/14/11 1700 12/14/11 1800 12/14/11 1900 12/14/11 2000  BP: 147/56 121/61  153/63  Pulse: 77 80 78 79  Temp:    98.4 F (36.9 C)  TempSrc:    Oral  Resp: 19 18 19 19   Height:      Weight:      SpO2: 90% 95% 95% 96%   cbg's remain elevated.  180 this pm.  Will give an evening dose of Lantus.  Diuresing well Otherwise stable  BMET    Component Value Date/Time   NA 140 12/14/2011 1658   K 4.0 12/14/2011 1658   CL 101 12/14/2011 1658   CO2 24 12/14/2011 0406   GLUCOSE 180* 12/14/2011 1658   BUN 16 12/14/2011 1658   CREATININE 0.72 12/14/2011 1700   CALCIUM 8.1* 12/14/2011 0406   GFRNONAA 87* 12/14/2011 1700   GFRAA >90 12/14/2011 1700    CBC    Component Value Date/Time   WBC 8.7 12/14/2011 1700   WBC 6.4 12/09/2011 1110   RBC 3.87 12/14/2011 1700   RBC 4.73 12/09/2011 1110   HGB 10.8* 12/14/2011 1700   HGB 13.4 12/09/2011 1110   HCT 34.1* 12/14/2011 1700   HCT 41.6 12/09/2011 1110   PLT 133* 12/14/2011 1700   PLT 145 12/09/2011 1110   MCV 88.1 12/14/2011 1700   MCV 87.9 12/09/2011 1110   MCH 27.9 12/14/2011 1700   MCH 28.3 12/09/2011 1110   MCHC 31.7 12/14/2011 1700   MCHC 32.2 12/09/2011 1110   RDW 13.8 12/14/2011 1700   RDW 13.8 12/09/2011 1110   LYMPHSABS 0.7 12/10/2011 0145   LYMPHSABS 1.7 12/09/2011 1110   MONOABS 0.1 12/10/2011 0145   MONOABS 0.7 12/09/2011 1110   EOSABS 0.0 12/10/2011 0145   EOSABS 0.2 12/09/2011 1110   BASOSABS 0.0 12/10/2011 0145   BASOSABS 0.0 12/09/2011 1110    A/P:  Stable.  Continue present course.

## 2011-12-15 ENCOUNTER — Inpatient Hospital Stay (HOSPITAL_COMMUNITY): Payer: Medicare Other

## 2011-12-15 LAB — BASIC METABOLIC PANEL
CO2: 27 mEq/L (ref 19–32)
Calcium: 8.4 mg/dL (ref 8.4–10.5)
Creatinine, Ser: 0.63 mg/dL (ref 0.50–1.10)
GFR calc Af Amer: >90 mL/min (ref >90)
GFR calc non Af Amer: >90 mL/min (ref >90)

## 2011-12-15 LAB — CBC
MCV: 89.3 fL (ref 78.0–100.0)
Platelets: 120 10*3/uL — ABNORMAL LOW (ref 150–400)
RDW: 13.8 % (ref 11.5–15.5)
WBC: 7.3 10*3/uL (ref 4.0–10.5)

## 2011-12-15 LAB — GLUCOSE, CAPILLARY: Glucose-Capillary: 130 mg/dL — ABNORMAL HIGH (ref 70–99)

## 2011-12-15 MED ORDER — METOCLOPRAMIDE HCL 5 MG/ML IJ SOLN
10.0000 mg | Freq: Four times a day (QID) | INTRAMUSCULAR | Status: AC
  Administered 2011-12-15 – 2011-12-16 (×4): 10 mg via INTRAVENOUS
  Filled 2011-12-15 (×6): qty 2

## 2011-12-15 MED ORDER — PROMETHAZINE HCL 25 MG/ML IJ SOLN
12.5000 mg | Freq: Four times a day (QID) | INTRAMUSCULAR | Status: DC | PRN
  Administered 2011-12-15: 12.5 mg via INTRAVENOUS
  Filled 2011-12-15: qty 1

## 2011-12-15 MED ORDER — METOCLOPRAMIDE HCL 5 MG/ML IJ SOLN: 5.0000 mg | Freq: Four times a day (QID) | INTRAMUSCULAR | Status: DC

## 2011-12-15 NOTE — Progress Notes (Signed)
2 Days Post-Op Procedure(s) (LRB): CORONARY ARTERY BYPASS GRAFTING (CABG) (N/A) Subjective: Complains of nausea despite Zofran this am Walked around ICU this am. Objective: Vital signs in last 24 hours: Temp:  [97 F (36.1 C)-99 F (37.2 C)] 98.6 F (37 C) (05/12 0741) Pulse Rate:  [75-90] 86  (05/12 0700) Cardiac Rhythm:  [-] Normal sinus rhythm (05/12 0700) Resp:  [15-25] 19  (05/12 0700) BP: (104-153)/(47-119) 135/119 mmHg (05/12 0700) SpO2:  [90 %-98 %] 94 % (05/12 0700) Arterial Line BP: (133)/(124) 133/124 mmHg (05/11 1000) Weight:  [123.7 kg (272 lb 11.3 oz)] 123.7 kg (272 lb 11.3 oz) (05/12 0700)  Hemodynamic parameters for last 24 hours: PAP: (44)/(21) 44/21 mmHg  Intake/Output from previous day: 05/11 0701 - 05/12 0700 In: 1250.1 [P.O.:550; I.V.:592.1; IV Piggyback:108] Out: 3250 [Urine:3220; Chest Tube:30] Intake/Output this shift:    General appearance: alert and cooperative Heart: regular rate and rhythm, S1, S2 normal, no murmur, click, rub or gallop Lungs: clear to auscultation bilaterally Abdomen: soft, non-tender; bowel sounds normal; no masses,  no organomegaly Extremities: edema mild Wound: dressings dry  Lab Results:  Basename 12/15/11 0405 12/14/11 1700  WBC 7.3 8.7  HGB 10.1* 10.8*  HCT 31.6* 34.1*  PLT 120* 133*   BMET:  Basename 12/15/11 0405 12/14/11 1700 12/14/11 1658 12/14/11 0406  NA 138 -- 140 --  K 3.8 -- 4.0 --  CL 102 -- 101 --  CO2 27 -- -- 24  GLUCOSE 129* -- 180* --  BUN 15 -- 16 --  CREATININE 0.63 0.72 -- --  CALCIUM 8.4 -- -- 8.1*    PT/INR:  Basename 12/13/11 1947  LABPROT 16.3*  INR 1.29   ABG    Component Value Date/Time   PHART 7.353 12/13/2011 2320   HCO3 26.0* 12/13/2011 2320   TCO2 26 12/14/2011 1658   O2SAT 92.0 12/13/2011 2320   CBG (last 3)   Basename 12/15/11 0749 12/15/11 0418 12/15/11 0015  GLUCAP 130* 127* 160*    CXR:  Mild left base atel  Assessment/Plan: S/P Procedure(s) (LRB): CORONARY  ARTERY BYPASS GRAFTING (CABG) (N/A) Will give some Reglan for nausea and if that is not effective will try Phenergan. Hold off on further diuresis for now. Diabetes:  Under control  LOS: 6 days    Edee Nifong K 12/15/2011

## 2011-12-16 ENCOUNTER — Ambulatory Visit: Payer: Medicare Other | Admitting: Physician Assistant

## 2011-12-16 ENCOUNTER — Encounter (HOSPITAL_COMMUNITY): Payer: Self-pay | Admitting: Surgery

## 2011-12-16 ENCOUNTER — Other Ambulatory Visit: Payer: Medicare Other | Admitting: Lab

## 2011-12-16 LAB — BASIC METABOLIC PANEL
BUN: 13 mg/dL (ref 6–23)
Creatinine, Ser: 0.73 mg/dL (ref 0.50–1.10)
GFR calc Af Amer: >90 mL/min (ref >90)
GFR calc non Af Amer: 86 mL/min — ABNORMAL LOW (ref >90)
Potassium: 4.1 mEq/L (ref 3.5–5.1)

## 2011-12-16 LAB — GLUCOSE, CAPILLARY
Glucose-Capillary: 114 mg/dL — ABNORMAL HIGH (ref 70–99)
Glucose-Capillary: 126 mg/dL — ABNORMAL HIGH (ref 70–99)
Glucose-Capillary: 128 mg/dL — ABNORMAL HIGH (ref 70–99)

## 2011-12-16 LAB — CBC
HCT: 30.6 % — ABNORMAL LOW (ref 36.0–46.0)
Hemoglobin: 9.7 g/dL — ABNORMAL LOW (ref 12.0–15.0)
WBC: 6 10*3/uL (ref 4.0–10.5)

## 2011-12-16 LAB — TYPE AND SCREEN: Antibody Screen: NEGATIVE

## 2011-12-16 LAB — URINE CULTURE

## 2011-12-16 MED ORDER — TRAMADOL HCL 50 MG PO TABS: 50.0000 mg | ORAL_TABLET | ORAL | Status: DC | PRN

## 2011-12-16 MED ORDER — BISACODYL 10 MG RE SUPP: 10.0000 mg | Freq: Every day | RECTAL | Status: DC | PRN

## 2011-12-16 MED ORDER — ACETAMINOPHEN 325 MG PO TABS: 650.0000 mg | ORAL_TABLET | Freq: Four times a day (QID) | ORAL | Status: DC | PRN

## 2011-12-16 MED ORDER — FUROSEMIDE 40 MG PO TABS
40.0000 mg | ORAL_TABLET | Freq: Every day | ORAL | Status: DC
  Administered 2011-12-17 – 2011-12-18 (×2): 40 mg via ORAL
  Filled 2011-12-16 (×2): qty 1

## 2011-12-16 MED ORDER — ONDANSETRON HCL 4 MG/2ML IJ SOLN: 4.0000 mg | Freq: Four times a day (QID) | INTRAMUSCULAR | Status: DC | PRN

## 2011-12-16 MED ORDER — DOCUSATE SODIUM 100 MG PO CAPS
200.0000 mg | ORAL_CAPSULE | Freq: Every day | ORAL | Status: DC
  Filled 2011-12-16: qty 2

## 2011-12-16 MED ORDER — SODIUM CHLORIDE 0.9 % IV SOLN
INTRAVENOUS | Status: DC
  Administered 2011-12-15: 20 mL via INTRAVENOUS

## 2011-12-16 MED ORDER — POTASSIUM CHLORIDE CRYS ER 20 MEQ PO TBCR
40.0000 meq | EXTENDED_RELEASE_TABLET | Freq: Once | ORAL | Status: AC
  Administered 2011-12-16: 40 meq via ORAL
  Filled 2011-12-16: qty 2

## 2011-12-16 MED ORDER — ASPIRIN EC 325 MG PO TBEC
325.0000 mg | DELAYED_RELEASE_TABLET | Freq: Every day | ORAL | Status: DC
  Administered 2011-12-17 – 2011-12-18 (×2): 325 mg via ORAL
  Filled 2011-12-16 (×3): qty 1

## 2011-12-16 MED ORDER — SODIUM CHLORIDE 0.9 % IJ SOLN: 3.0000 mL | INTRAMUSCULAR | Status: DC | PRN

## 2011-12-16 MED ORDER — LORATADINE 10 MG PO TABS
10.0000 mg | ORAL_TABLET | Freq: Every day | ORAL | Status: DC
  Administered 2011-12-16 – 2011-12-18 (×3): 10 mg via ORAL
  Filled 2011-12-16 (×3): qty 1

## 2011-12-16 MED ORDER — METOPROLOL TARTRATE 25 MG PO TABS
25.0000 mg | ORAL_TABLET | Freq: Two times a day (BID) | ORAL | Status: DC
  Administered 2011-12-16 – 2011-12-17 (×3): 25 mg via ORAL
  Filled 2011-12-16 (×6): qty 1

## 2011-12-16 MED ORDER — POTASSIUM CHLORIDE CRYS ER 20 MEQ PO TBCR
40.0000 meq | EXTENDED_RELEASE_TABLET | Freq: Every day | ORAL | Status: DC
  Administered 2011-12-17 – 2011-12-18 (×2): 40 meq via ORAL
  Filled 2011-12-16 (×2): qty 2

## 2011-12-16 MED ORDER — SODIUM CHLORIDE 0.9 % IJ SOLN
3.0000 mL | Freq: Two times a day (BID) | INTRAMUSCULAR | Status: DC
  Administered 2011-12-16 – 2011-12-18 (×4): 3 mL via INTRAVENOUS
  Administered 2011-12-18: 10 mL via INTRAVENOUS

## 2011-12-16 MED ORDER — BISACODYL 5 MG PO TBEC: 10.0000 mg | DELAYED_RELEASE_TABLET | Freq: Every day | ORAL | Status: DC | PRN

## 2011-12-16 MED ORDER — METOCLOPRAMIDE HCL 10 MG PO TABS
10.0000 mg | ORAL_TABLET | Freq: Three times a day (TID) | ORAL | Status: DC
  Administered 2011-12-16 – 2011-12-17 (×2): 10 mg via ORAL
  Filled 2011-12-16 (×5): qty 1

## 2011-12-16 MED ORDER — FUROSEMIDE 10 MG/ML IJ SOLN
40.0000 mg | Freq: Once | INTRAMUSCULAR | Status: AC
  Administered 2011-12-16: 40 mg via INTRAVENOUS
  Filled 2011-12-16: qty 4

## 2011-12-16 MED ORDER — PANTOPRAZOLE SODIUM 40 MG PO TBEC
40.0000 mg | DELAYED_RELEASE_TABLET | Freq: Every day | ORAL | Status: DC
  Administered 2011-12-17 – 2011-12-18 (×2): 40 mg via ORAL
  Filled 2011-12-16 (×3): qty 1

## 2011-12-16 MED ORDER — INSULIN ASPART 100 UNIT/ML ~~LOC~~ SOLN
0.0000 [IU] | Freq: Three times a day (TID) | SUBCUTANEOUS | Status: DC
  Administered 2011-12-16 – 2011-12-18 (×5): 2 [IU] via SUBCUTANEOUS

## 2011-12-16 MED ORDER — MOVING RIGHT ALONG BOOK
Freq: Once | Status: AC
  Administered 2011-12-16: 15:00:00
  Filled 2011-12-16: qty 1

## 2011-12-16 MED ORDER — OXYCODONE HCL 5 MG PO TABS
5.0000 mg | ORAL_TABLET | ORAL | Status: DC | PRN
  Administered 2011-12-16 – 2011-12-18 (×3): 10 mg via ORAL
  Filled 2011-12-16 (×3): qty 2

## 2011-12-16 MED ORDER — ONDANSETRON HCL 4 MG PO TABS: 4.0000 mg | ORAL_TABLET | Freq: Four times a day (QID) | ORAL | Status: DC | PRN

## 2011-12-16 MED ORDER — SODIUM CHLORIDE 0.9 % IV SOLN: 250.0000 mL | INTRAVENOUS | Status: DC | PRN

## 2011-12-16 MED ORDER — TIOTROPIUM BROMIDE MONOHYDRATE 18 MCG IN CAPS
18.0000 ug | ORAL_CAPSULE | Freq: Every day | RESPIRATORY_TRACT | Status: DC
  Administered 2011-12-16 – 2011-12-18 (×3): 18 ug via RESPIRATORY_TRACT
  Filled 2011-12-16: qty 5

## 2011-12-16 MED FILL — Potassium Chloride Inj 2 mEq/ML: INTRAVENOUS | Qty: 40 | Status: AC

## 2011-12-16 MED FILL — Magnesium Sulfate Inj 50%: INTRAMUSCULAR | Qty: 10 | Status: AC

## 2011-12-16 NOTE — Progress Notes (Signed)
CARDIAC REHAB PHASE I   PRE:  Rate/Rhythm: 110 ST  BP:  Supine:   Sitting: 142/73  Standing:    SaO2: 92 RA  MODE:  Ambulation: 350 ft   POST:  Rate/Rhythem: 128 ST  BP:  Supine:   Sitting: 131/60  Standing:    SaO2: 96 2L 1410-1450 Assisted x 2 used walker and O2 2L to ambulate. Gait steady with walker. Pt ask for O2 to ambulate states it made her feel better to wear O2 for the walk. O2 discontinued after walk. Pt to bed after walk with call light in reach and daughter present.  Jamie [name]

## 2011-12-16 NOTE — Care Management Note (Unsigned)
    Page 1 of 2   12/18/2011     2:27:16 PM   CARE MANAGEMENT NOTE 12/18/2011  Patient:  Jamie [name], Jamie [name]   Account Number:  [id number]  Date Initiated:  12/10/2011  Documentation initiated by:  [facility]  Subjective/Objective Assessment:   ADMITTED W/SOB.ELEVATED TROP.     Action/Plan:   FROM HOME   Anticipated DC Date:  12/13/2011   Anticipated DC Plan:  HOME W HOME HEALTH SERVICES      DC Planning Services  CM consult      [facility] Choice  HOME HEALTH   Choice offered to / List presented to:  C-1 Patient        HH arranged  HH-1 RN      [facility] agency  [facility]   Status of service:  Completed, signed off Medicare Important Message given?   (If response is "NO", the following Medicare IM given date fields will be blank) Date Medicare IM given:   Date Additional Medicare IM given:    Discharge Disposition:  HOME W HOME HEALTH SERVICES  Per UR Regulation:  Reviewed for med. necessity/level of care/duration of stay  If discussed at Long Length of Stay Meetings, dates discussed:    Comments:  12/18/11 Jamie Conry,RN,BSN 1100 PT FOR POSSIBLE DC HOME LATER THIS AFTERNOON, WITH DAUGHTER AND HUSBAND.  PT INTERESTED IN OBTAINING LIFT CHAIR FOR HOME.  [employer] ON [address] DOES RENT LIFT CHAIRS AT $75/WK OR $175/MO, WITH ONE TIME $50 DELIVERY AND PICK UP FEE, IF NEEDED.  (PHONE [phone]). PT STATES THEY MAY END UP BUYING ONE.  THEY ARE INTERESTED IN [name] FOR RESTORATIVE CARE AT DC:  THEY CHOOSE [facility] FOR HOME HEALTH FOLLOW UP.  REFERRAL TO DEBBIE WITH [facility].  START OF CARE 24-48H POST DC DATE.  PT HAS RW AVAILABLE TO HER FOR USE AT HOME.  12/16/11 Jamie Garringer,RN,BSN 1430 PT S/P CABG X 4 ON 12/13/11.  PTA, PT INDEPENDENT, LIVES WITH SPOUSE.  PT STATES HUSBAND AND DAUGHTER WILL PROVIDE 24HR CARE AT DC.  WILL FOLLLOW FOR HOME NEEDS AS PT PROGRESSES. [phone]   12/10/11 KATHY MAHABIR RN,BSN NCM [phone] 5/8 16:30 debbie dowell rn,bsn  [phone] pt transf to [facility] for cath. lives w husband. pcp dr Herbert Seta spry. pt for pci vs cvts eval.

## 2011-12-16 NOTE — Progress Notes (Signed)
3 Days Post-Op Procedure(s) (LRB): CORONARY ARTERY BYPASS GRAFTING (CABG) (N/A) Subjective: No complaints  Objective: Vital signs in last 24 hours: Temp:  [97.8 F (36.6 C)-99.4 F (37.4 C)] 98.9 F (37.2 C) (05/13 0400) Pulse Rate:  [81-99] 99  (05/13 0700) Cardiac Rhythm:  [-] Normal sinus rhythm (05/13 0700) Resp:  [0-24] 24  (05/13 0700) BP: (87-159)/(47-72) 147/55 mmHg (05/13 0600) SpO2:  [92 %-100 %] 97 % (05/13 0700) Weight:  [123.9 kg (273 lb 2.4 oz)] 123.9 kg (273 lb 2.4 oz) (05/13 0700)  Hemodynamic parameters for last 24 hours:    Intake/Output from previous day: 05/12 0701 - 05/13 0700 In: 766 [P.O.:480; I.V.:280; IV Piggyback:6] Out: 1010 [Urine:1010] Intake/Output this shift:    General appearance: alert and cooperative Heart: regular rate and rhythm, S1, S2 normal, no murmur, click, rub or gallop Lungs: clear to auscultation bilaterally Extremities: edema mild Wound: incision ok  Lab Results:  Basename 12/16/11 0430 12/15/11 0405  WBC 6.0 7.3  HGB 9.7* 10.1*  HCT 30.6* 31.6*  PLT 130* 120*   BMET:  Basename 12/16/11 0430 12/15/11 0405  NA 137 138  K 4.1 3.8  CL 103 102  CO2 28 27  GLUCOSE 111* 129*  BUN 13 15  CREATININE 0.73 0.63  CALCIUM 8.5 8.4    PT/INR:  Basename 12/13/11 1947  LABPROT 16.3*  INR 1.29   ABG    Component Value Date/Time   PHART 7.353 12/13/2011 2320   HCO3 26.0* 12/13/2011 2320   TCO2 26 12/14/2011 1658   O2SAT 92.0 12/13/2011 2320   CBG (last 3)   Basename 12/16/11 0557 12/16/11 0027 12/15/11 2023  GLUCAP 114* 128* 165*    Assessment/Plan: S/P Procedure(s) (LRB): CORONARY ARTERY BYPASS GRAFTING (CABG) (N/A) Mobilize Diuresis Diabetes control Plan for transfer to step-down: see transfer orders   LOS: 7 days    Jamie [name] K 12/16/2011

## 2011-12-17 LAB — GLUCOSE, CAPILLARY
Glucose-Capillary: 129 mg/dL — ABNORMAL HIGH (ref 70–99)
Glucose-Capillary: 117 mg/dL — ABNORMAL HIGH (ref 70–99)

## 2011-12-17 MED ORDER — OXYCODONE HCL 5 MG PO TABS: 5.0000 mg | ORAL_TABLET | ORAL | Status: AC | PRN

## 2011-12-17 MED ORDER — GUAIFENESIN ER 600 MG PO TB12
600.0000 mg | ORAL_TABLET | Freq: Two times a day (BID) | ORAL | Status: DC | PRN
  Administered 2011-12-17: 600 mg via ORAL
  Filled 2011-12-17 (×2): qty 1

## 2011-12-17 MED ORDER — METOPROLOL TARTRATE 25 MG PO TABS: 25.0000 mg | ORAL_TABLET | Freq: Two times a day (BID) | ORAL | Status: DC

## 2011-12-17 MED ORDER — LISINOPRIL 5 MG PO TABS: 5.0000 mg | ORAL_TABLET | Freq: Every day | ORAL | Status: DC

## 2011-12-17 MED ORDER — ATORVASTATIN CALCIUM 80 MG PO TABS: 80.0000 mg | ORAL_TABLET | Freq: Every day | ORAL | Status: DC

## 2011-12-17 MED ORDER — METFORMIN HCL 500 MG PO TABS: 500.0000 mg | ORAL_TABLET | Freq: Two times a day (BID) | ORAL | Status: DC

## 2011-12-17 MED ORDER — ASPIRIN 325 MG PO TBEC: 325.0000 mg | DELAYED_RELEASE_TABLET | Freq: Every day | ORAL | Status: AC

## 2011-12-17 MED ORDER — LISINOPRIL 5 MG PO TABS
5.0000 mg | ORAL_TABLET | Freq: Every day | ORAL | Status: DC
  Administered 2011-12-17 – 2011-12-18 (×2): 5 mg via ORAL
  Filled 2011-12-17 (×2): qty 1

## 2011-12-17 MED ORDER — POTASSIUM CHLORIDE CRYS ER 20 MEQ PO TBCR: 20.0000 meq | EXTENDED_RELEASE_TABLET | Freq: Every day | ORAL | Status: DC

## 2011-12-17 MED ORDER — FUROSEMIDE 40 MG PO TABS: 40.0000 mg | ORAL_TABLET | Freq: Every day | ORAL | Status: DC

## 2011-12-17 MED ORDER — METFORMIN HCL 500 MG PO TABS
500.0000 mg | ORAL_TABLET | Freq: Two times a day (BID) | ORAL | Status: DC
  Administered 2011-12-17 – 2011-12-18 (×3): 500 mg via ORAL
  Filled 2011-12-17 (×5): qty 1

## 2011-12-17 MED FILL — Lidocaine HCl IV Inj 20 MG/ML: INTRAVENOUS | Qty: 5 | Status: AC

## 2011-12-17 MED FILL — Sodium Chloride Irrigation Soln 0.9%: Qty: 3000 | Status: AC

## 2011-12-17 MED FILL — Heparin Sodium (Porcine) Inj 1000 Unit/ML: INTRAMUSCULAR | Qty: 30 | Status: AC

## 2011-12-17 MED FILL — Heparin Sodium (Porcine) Inj 1000 Unit/ML: INTRAMUSCULAR | Qty: 10 | Status: AC

## 2011-12-17 MED FILL — Mannitol IV Soln 20%: INTRAVENOUS | Qty: 500 | Status: AC

## 2011-12-17 MED FILL — Electrolyte-R (PH 7.4) Solution: INTRAVENOUS | Qty: 5000 | Status: AC

## 2011-12-17 MED FILL — Sodium Chloride IV Soln 0.9%: INTRAVENOUS | Qty: 1000 | Status: AC

## 2011-12-17 MED FILL — Sodium Bicarbonate IV Soln 8.4%: INTRAVENOUS | Qty: 50 | Status: AC

## 2011-12-17 NOTE — Plan of Care (Signed)
Problem: Phase III Progression Outcomes Goal: Transfer to PCTU/Telemetry POD Outcome: Completed/Met Date Met:  12/17/11 Post op day 3

## 2011-12-17 NOTE — Discharge Summary (Signed)
[address]            [place] [address]          [phone]         Discharge Summary  Name: Jamie [name] DOB: 02/09/1946 66 y.o. MRN: [id number]  Admission Date: 12/09/2011 Discharge Date:    Admitting Diagnosis:  Chest pain  Discharge Diagnosis:   Non-ST segment elevation myocardial infarction  High-grade left main and severe multivessel coronary artery disease  COPD  Type 2 diabetes mellitus  Obesity  Sleep apnea  Left breast cancer (T2 pN1, stage IIB invasive ductal carcinoma, grade 2, estrogen and progesterone receptor strongly positive, with no HER-2 amplification and an Mib-1 of 30%)  Status post bilateral mastectomies and 1 dose chemotherapy  Hyperlipidemia  History of tobacco use   Procedures: Procedure(s): CORONARY ARTERY BYPASS GRAFTING (CABG) x 4 (left internal mammary artery to the LAD, saphenous vein graft to the right coronary artery, sequential saphenous vein graft to the diagonal and the obtuse marginal), endoscopic vein harvest left leg -12/13/2011   HPI:  The patient is a 66 y.o. female with a prior history of smoking and COPD, hyperlipidemia, diabetes, obesity, and a family history of heart disease who underwent bilateral mastectomies with immediate reconstruction on 10/18/2011 for left breast cancer. She had a Port-A-Cath placed on 12/02/2011 and started an Adriamycin-based chemotherapy regimen on Monday of this week. She said that she felt pretty well afterwards and she went home but then developed acute shortness of breath while talking with her grandson, followed by development of a dull substernal chest pain radiating into her arms. She presented to [facility] and ruled in for a non-ST segment elevation MI. A cardiology consult was obtained. She was transferred to [facility] on the date of admission for further evaluation and treatment.    Hospital Course:  The patient was admitted to [facility] on 12/09/2011. She did have recurrence of chest discomfort with development of severe aching in her teeth. It  was recommended that she proceed with cardiac catheterization, and this was performed on 12/11/2011 by Dr. Shirlee Latch. This revealed 75% ostial left main coronary stenosis as well as 80% proximal LAD stenosis. Left circumflex was small and had no significant disease. The right coronary was occluded after a large acute marginal with filling of the distal vessel by collaterals from the LAD. Left ventricular function was normal. Because of her high grade left main stenosis, a cardiac surgery consult was requested. Dr. Laneta Simmers saw the patient and reviewed her films and agreed with the need for coronary artery bypass grafting. All risks, benefits and alternatives of surgery were explained in detail, and the patient agreed to proceed. The patient was taken to the operating room and underwent the above procedure.    The postoperative course has generally been uneventful. She has been started on a beta blocker, an ACE inhibitor, a statin, and aspirin for her NSTEMI. She has also been started on metformin for her diabetes with a hemoglobin A1c of 6.7 on admission. She has remained in sinus rhythm postoperatively. Her vital signs have been stable. She has been mildly volume overloaded and was started on Lasix, to which she is responding well. She is ambulating in the halls with cardiac rehabilitation phase 1 and progressing well. She is tolerating a regular diet. We anticipate discharge home within the next 24-48 hours  provided she continues to remain stable.    Recent vital signs:  Filed Vitals:   12/17/11 0506  BP: 145/51  Pulse: 105  Temp: 99.1 F (37.3 C)  Resp: 21    Recent laboratory studies:  CBC: Basename 12/16/11 0430 12/15/11 0405  WBC 6.0 7.3  HGB 9.7* 10.1*  HCT 30.6* 31.6*  PLT 130* 120*   BMET:  Basename 12/16/11 0430 12/15/11 0405  NA 137 138  K 4.1 3.8  CL 103 102  CO2 28 27   GLUCOSE 111* 129*  BUN 13 15  CREATININE 0.73 0.63  CALCIUM 8.5 8.4    PT/INR: No results found for this basename: LABPROT,INR in the last 72 hours  Discharge Medications:   Medication List  As of 12/17/2011  9:51 AM   STOP taking these medications         dexamethasone 4 MG tablet      oxyCODONE-acetaminophen 5-325 MG per tablet      simvastatin 20 MG tablet         TAKE these medications         albuterol 108 (90 BASE) MCG/ACT inhaler   Commonly known as: PROVENTIL HFA;VENTOLIN HFA   Inhale 2 puffs into the lungs every 6 (six) hours as needed. For wheezing      aspirin 325 MG EC tablet   Take 1 tablet (325 mg total) by mouth daily.      atorvastatin 80 MG tablet   Commonly known as: LIPITOR   Take 1 tablet (80 mg total) by mouth daily at 6 PM.      furosemide 40 MG tablet   Commonly known as: LASIX   Take 1 tablet (40 mg total) by mouth daily. X 1 week      gabapentin 300 MG capsule   Commonly known as: NEURONTIN   Take 1 capsule (300 mg total) by mouth 3 (three) times daily.      lidocaine-prilocaine cream   Commonly known as: EMLA   Apply 1 application topically as needed. Port cath      lisinopril 5 MG tablet   Commonly known as: PRINIVIL,ZESTRIL   Take 1 tablet (5 mg total) by mouth daily.      loratadine 10 MG tablet   Commonly known as: CLARITIN   Take 10 mg by mouth daily.      LORazepam 0.5 MG tablet   Commonly known as: ATIVAN   Take 0.5 mg by mouth at bedtime as needed.      metFORMIN 500 MG tablet   Commonly known as: GLUCOPHAGE   Take 1 tablet (500 mg total) by mouth 2 (two) times daily with a meal.      metoprolol tartrate 50 MG tablet   Commonly known as: LOPRESSOR   Take 1 tablet (50 mg total) by mouth 2 (two) times daily.      oxyCODONE 5 MG immediate release tablet   Commonly known as: Oxy IR/ROXICODONE   Take 1-2 tablets (5-10 mg total) by mouth every 3 (three) hours as needed for pain.      potassium chloride SA 20 MEQ tablet     Commonly known as: K-DUR,KLOR-CON   Take 1 tablet (20 mEq total) by mouth daily. X 1 week      prochlorperazine 10 MG tablet   Commonly known as: COMPAZINE   Take 10 mg by mouth every 6 (six) hours. Nausea      tiotropium 18 MCG inhalation capsule   Commonly known as:  SPIRIVA   Place 18 mcg into inhaler and inhale daily.      triamcinolone cream 0.1 %   Commonly known as: KENALOG   Apply 1 application topically 2 (two) times daily.            Discharge Instructions:  The patient is to refrain from driving, heavy lifting or strenuous activity.  May shower daily and clean incisions with soap and water.  May resume regular diet.  Discharge Orders    Future Appointments: Provider: Department: Dept Phone: Center:   12/31/2011 10:15 AM Delcie Roch [facility] [phone] None   12/31/2011 10:45 AM Amy Allegra Grana, PA [facility] [phone] None   01/06/2012 10:15 AM Mauri Brooklyn [facility] [phone] None   01/06/2012 10:45 AM Amy Allegra Grana, PA [facility] [phone] None   01/06/2012 11:30 AM [facility] [phone] None   01/07/2012 1:30 PM Alleen Borne, MD [facility] [phone] [facility]   01/09/2012 11:30 AM [facility] Flush Nurse [facility] [phone] None   01/13/2012 10:15 AM Mauri Brooklyn [facility] [phone] None   01/13/2012 10:45 AM Amy Allegra Grana, PA [facility] [phone] None   01/20/2012 10:15 AM Mauri Brooklyn [facility] [phone] None   01/20/2012 10:45 AM Amy Allegra Grana, PA [facility] [phone] None   01/20/2012 11:45 AM [facility] [phone] None   01/23/2012 11:30 AM [facility] Flush Nurse [facility] [phone] None   01/27/2012 10:15 AM Mauri Brooklyn [facility] [phone] None   01/27/2012 10:45 AM Amy Allegra Grana, PA [facility] [phone] None      Follow-up Information    Follow up with [name], MD. Schedule an appointment as soon as possible for a visit in 2 weeks.    Contact information:   [address] [place] [address] [phone]       Follow up with Alleen Borne, MD on 01/07/2012. (Have a chest x-ray at 12:30, then see MD at 1:30)    Contact information:   [address] [place] [address] [phone]           Adella Hare 12/17/2011, 9:51 AM

## 2011-12-17 NOTE — Progress Notes (Signed)
CARDIAC REHAB PHASE I   PRE:  Rate/Rhythm: 90SR  BP:  Supine: 117/55  Sitting:   Standing:    SaO2: 95%RA  MODE:  Ambulation: 510 ft   POST:  Rate/Rhythem: 103ST  BP:  Supine:   Sitting: 112/50  Standing:    SaO2: 93%RA 1045-1145 Pt assisted to bathroom and then walked 510 ft on RA with rolling walker and asst x 2. Several standing rest breaks during walk. To recliner with call bell. Can be asst x 1 if in chair but asst x 2 if in the bed. Needs much assistance to get from lying to sitting. Some DOE noted and pt stated felt SOB but sats stable.  Jamie [name]

## 2011-12-17 NOTE — Progress Notes (Addendum)
4 Days Post-Op Procedure(s) (LRB): CORONARY ARTERY BYPASS GRAFTING (CABG) (N/A)  Subjective: Patient with bowel movement. No real complaints this am.  Objective: Vital signs in last 24 hours: Patient Vitals for the past 24 hrs:  BP Temp Temp src Pulse Resp SpO2 Weight  12/17/11 0506 145/51 mmHg 99.1 F (37.3 C) Oral 105  21  94 % 269 lb 6.4 oz (122.2 kg)  12/16/11 2037 142/67 mmHg 98.9 F (37.2 C) Oral 107  20  95 % -  12/16/11 1523 - - - - - 91 % -  12/16/11 1402 142/73 mmHg 99.6 F (37.6 C) Oral 109  23  94 % -  12/16/11 1300 142/73 mmHg - - 106  25  94 % -  12/16/11 1212 - 98.2 F (36.8 C) Oral - - - -  12/16/11 1200 124/89 mmHg - - 102  19  95 % -  12/16/11 1100 138/74 mmHg - - 95  20  95 % -  12/16/11 1000 119/47 mmHg - - 99  23  93 % -  12/16/11 0900 129/46 mmHg - - 100  25  92 % -  12/16/11 0800 127/53 mmHg - - 96  19  95 % -  12/16/11 0751 - 98.3 F (36.8 C) Oral - - - -   Pre op weight  123.8 kg Current Weight  12/17/11 269 lb 6.4 oz (122.2 kg)      Intake/Output from previous day: 05/13 0701 - 05/14 0700 In: 584 [P.O.:480; I.V.:100; IV Piggyback:4] Out: 1360 [Urine:1360]   Physical Exam:  Cardiovascular: RRR, no murmurs, gallops, or rubs. Pulmonary: Slightly diminished at bases; no rales, wheezes, or rhonchi. Abdomen: Soft, non tender, bowel sounds present. Extremities: Mild bilateral lower extremity edema. Wounds: Clean and dry.  No erythema or signs of infection.  Lab Results: CBC: Basename 12/16/11 0430 12/15/11 0405  WBC 6.0 7.3  HGB 9.7* 10.1*  HCT 30.6* 31.6*  PLT 130* 120*   BMET:  Basename 12/16/11 0430 12/15/11 0405  NA 137 138  K 4.1 3.8  CL 103 102  CO2 28 27  GLUCOSE 111* 129*  BUN 13 15  CREATININE 0.73 0.63  CALCIUM 8.5 8.4    PT/INR: No results found for this basename: LABPROT,INR in the last 72 hours ABG:  INR: Will add last result for INR, ABG once components are confirmed Will add last 4 CBG results once components are  confirmed  Assessment/Plan:  1. CV - s/p NSTEMI.SR.Continue Lopressor 25 bid. Monitor HR as may need to increase.Start low dose Lisniopril. 2.  Pulmonary - Encourage incentive spirometer. 3. Volume Overload - Continue with diuresis. 4.  Acute blood loss anemia - Last H/H 9.7/30.6. 5.DM-HGA1C pre op 6.7.CBGs 117/126/121.On scheduled Insulin. Patient was previously diabetic prior to lap band surgery. Will need follow up as outpatient as will likely require oral medicine. 6.Stop scheduled stool softener as stools getting loose. 7.Possibly home in 1-2 days.  ZIMMERMAN,DONIELLE MPA-C 12/17/2011    Chart reviewed, patient examined, agree with above. Will start some glucophage and DC Lantus. She needs diabetes education because she does not know what kinds of food to avoid with diabetes.

## 2011-12-17 NOTE — Plan of Care (Signed)
Problem: Phase III Progression Outcomes Goal: Time patient transferred to PCTU/Telemetry POD Outcome: Completed/Met Date Met:  12/17/11 1400 PM POD 3

## 2011-12-17 NOTE — Progress Notes (Signed)
Pt ambulated on room air with rolling walker approx 600 feet. Tolerated well, some SOB towards the end of the walk.

## 2011-12-18 DIAGNOSIS — I2510 Atherosclerotic heart disease of native coronary artery without angina pectoris: Secondary | ICD-10-CM

## 2011-12-18 MED ORDER — HEPARIN SOD (PORK) LOCK FLUSH 100 UNIT/ML IV SOLN
500.0000 [IU] | INTRAVENOUS | Status: AC | PRN
  Administered 2011-12-18: 500 [IU]

## 2011-12-18 MED ORDER — METOPROLOL TARTRATE 50 MG PO TABS
50.0000 mg | ORAL_TABLET | Freq: Two times a day (BID) | ORAL | Status: DC
  Administered 2011-12-18: 50 mg via ORAL
  Filled 2011-12-18 (×2): qty 1

## 2011-12-18 MED ORDER — METOPROLOL TARTRATE 25 MG PO TABS: 50.0000 mg | ORAL_TABLET | Freq: Two times a day (BID) | ORAL | Status: DC

## 2011-12-18 MED ORDER — METOPROLOL TARTRATE 50 MG PO TABS: 50.0000 mg | ORAL_TABLET | Freq: Two times a day (BID) | ORAL | Status: DC

## 2011-12-18 NOTE — Progress Notes (Signed)
5 Days Post-Op Procedure(s) (LRB): CORONARY ARTERY BYPASS GRAFTING (CABG) (N/A)  Subjective: Patient still had loose stools from stool softeners given 2 days ago.  Objective: Vital signs in last 24 hours: Patient Vitals for the past 24 hrs:  BP Temp Temp src Pulse Resp SpO2  12/18/11 0533 146/46 mmHg 98.9 F (37.2 C) Oral 102  20  94 %  12/17/11 2058 134/77 mmHg 98.4 F (36.9 C) Oral 101  20  97 %  12/17/11 1350 137/82 mmHg 98.3 F (36.8 C) Oral 110  20  98 %   Pre op weight  123.8 kg Current Weight  12/17/11 269 lb 6.4 oz (122.2 kg)      Intake/Output from previous day: 05/14 0701 - 05/15 0700 In: 340 [P.O.:340] Out: 850 [Urine:850]   Physical Exam:  Cardiovascular: RRR, no murmurs, gallops, or rubs. Pulmonary: Slightly diminished at bases; no rales, wheezes, or rhonchi. Abdomen: Soft, non tender, bowel sounds present. Extremities: Mild bilateral lower extremity edema. Wounds: Clean and dry.  No erythema or signs of infection.  Lab Results: CBC:  Basename 12/16/11 0430  WBC 6.0  HGB 9.7*  HCT 30.6*  PLT 130*   BMET:   Basename 12/16/11 0430  NA 137  K 4.1  CL 103  CO2 28  GLUCOSE 111*  BUN 13  CREATININE 0.73  CALCIUM 8.5    PT/INR: No results found for this basename: LABPROT,INR in the last 72 hours ABG:  INR: Will add last result for INR, ABG once components are confirmed Will add last 4 CBG results once components are confirmed  Assessment/Plan:  1. CV - s/p NSTEMI.SR.Increase Lopressor to 50 bid.Contiue Lisinopril 5 daily. 2.  Pulmonary - Encourage incentive spirometer. 3. Volume Overload - Continue with diuresis. 4.  Acute blood loss anemia - Last H/H 9.7/30.6. 5.DM-HGA1C pre op 6.7.CBGs 117/153/122.Patient was previously diabetic prior to lap band surgery. Started on Metformin 500 bid with good glucose control.Will need follow up as outpatient. 6.Remove EPW and CT sutures. 7.Arrange for home health. 8.Possible discharge later today vs in  am.  Winfred Iiams MPA-C 12/18/2011

## 2011-12-18 NOTE — Plan of Care (Signed)
Problem: Food- and Nutrition-Related Knowledge Deficit (NB-1.1) Goal: Nutrition education Formal process to instruct or train a patient/client in a skill or to impart knowledge to help patients/clients voluntarily manage or modify food choices and eating behavior to maintain or improve health.  Outcome: Completed/Met Date Met:  12/18/11 RD consult for DM diet education. Reviewed guidelines and recommendations. Handouts provided from [employer]. No further nutrition intervention indicated at this time; patient discharging today.  Alger Memos Pager #: [phone]

## 2011-12-18 NOTE — Progress Notes (Signed)
Jamie [name]   DOB:1945-12-29   WU#:[id number]   GNF#:[id number]  Subjective: Jamie [name] looks terrific, pain moderately controlled, some constipation, confused re. What diet she should be on; husband in room   Objective: 66-year-old White woman examined sitting in recliner Filed Vitals:   12/18/11 0533  BP: 146/46  Pulse: 102  Temp: 98.9 F (37.2 C)  Resp: 20    Body mass index is 44.83 kg/(m^2).  Intake/Output Summary (Last 24 hours) at 12/18/11 0621 Last data filed at 12/17/11 1700  Gross per 24 hour  Intake    340 ml  Output    850 ml  Net   -510 ml     Sclerae unicteric  Oropharynx clear  Lungs clear -- no rales or rhonchi  Heart regular rate and rhythm  Abdomen benign  Neuro nonfocal  CABG incision healing very nicely  CBG (last 3)   Basename 12/17/11 2055 12/17/11 1635 12/17/11 1139  GLUCAP 153* 117* 129*     Labs:  Lab Results  Component Value Date   WBC 6.0 12/16/2011   HGB 9.7* 12/16/2011   HCT 30.6* 12/16/2011   MCV 89.0 12/16/2011   PLT 130* 12/16/2011   NEUTROABS 4.3 12/10/2011     Lab 12/16/11 0430 12/15/11 0405 12/14/11 1700 12/14/11 1658 12/14/11 0406 12/13/11 1949 12/13/11 0500 12/12/11 0900  NA 137 138 -- 140 139 138 -- --  K 4.1 3.8 -- 4.0 4.4 4.1 -- --  CL 103 102 -- 101 106 -- 98 --  CO2 28 27 -- -- 24 -- 29 28  GLUCOSE 111* 129* -- 180* 154* 146* -- --  BUN 13 15 -- 16 18 -- 24* --  CREATININE 0.73 0.63 0.72 0.80 0.64 -- -- --  CALCIUM 8.5 8.4 -- -- 8.1* -- 9.2 9.1  MG -- -- 2.6* -- 2.8* -- -- --    Urine Studies No results found for this basename: UACOL:2,UAPR:2,USPG:2,UPH:2,UTP:2,UGL:2,UKET:2,UBIL:2,UHGB:2,UNIT:2,UROB:2,ULEU:2,UEPI:2,UWBC:2,URBC:2,UBAC:2,CAST:2,CRYS:2,UCOM:2,BILUA:2 in the last 72 hours     Studies:  No results found.  Assessment: 66 year-old Jamie [name],  woman  (1) s/p bilateral mastectomies and left axillary lymph node dissection 10/18/2011 for a pT2 pN1, stage IIB invasive ductal carcinoma, grade 2, estrogen and  progesterone receptor strongly positive, with no HER-2 amplification and an Mib-1 of 30%,  (2) s/p her first, truncated treatment with cytoxan and adriamycin May 6 (the adriamycin given by continues infusion over 72 hours, disconnected after about 6 hours)  (3) admitted May 6, a few hours after chemotherapy started, with unstable angina, CT angio negative for PE, echo showing well-preserved EF, enzymes/ECG showing a NSTEMI, s/p cath May 7 showing left main, LAD and RCA disease with a well-preserved ejection fraction, revascularization performed 12/13/2011 with an uneventful postoperative course    Plan: she will see me again May 28 at 10:15 AM; we will review her cancer situation in light of her new CAD diagnosis and decide whether to resume chemo once fully recovered or forgo that risk-reduction strategy and move straight to aromatase inhibitors; greatly appreciate CARDS/SU help to this patient!   Jamie [name] C 12/18/2011

## 2011-12-18 NOTE — Progress Notes (Signed)
Pt refused to walk tonight,had 2 long walks today per pt claimed Jamie [name] [employer]

## 2011-12-18 NOTE — Progress Notes (Signed)
Ed completed with pt and daughter. Voiced understanding and requests her name be sent to [facility]. Hopes to start for 1 mo or so before restarting chemo. [phone] Ethelda Chick CES, ACSM

## 2011-12-18 NOTE — Progress Notes (Signed)
Pt discharge instructions and education complete. Porta-cath de accessed per IV team. Site WNL. Home Health in place. Pt and family had no further questions. Pt d/c home with family. Dion Saucier

## 2011-12-18 NOTE — Progress Notes (Signed)
Pacing wires pulled at 1300 per MD order. Pt tolerated well. CT sutures pulled. No arrhythmias noted. V/S remain stable. All wires intact. Bedrest until 1400. Call bell within reach. Jamie [name]

## 2011-12-23 ENCOUNTER — Other Ambulatory Visit: Payer: Medicare Other | Admitting: Lab

## 2011-12-23 ENCOUNTER — Ambulatory Visit: Payer: Medicare Other

## 2011-12-23 ENCOUNTER — Ambulatory Visit: Payer: Medicare Other | Admitting: Physician Assistant

## 2011-12-26 ENCOUNTER — Ambulatory Visit: Payer: Medicare Other

## 2011-12-31 ENCOUNTER — Other Ambulatory Visit: Payer: Medicare Other | Admitting: Lab

## 2011-12-31 ENCOUNTER — Ambulatory Visit: Payer: Medicare Other | Admitting: Physician Assistant

## 2012-01-01 ENCOUNTER — Encounter: Payer: Self-pay | Admitting: Physician Assistant

## 2012-01-01 ENCOUNTER — Other Ambulatory Visit (HOSPITAL_BASED_OUTPATIENT_CLINIC_OR_DEPARTMENT_OTHER): Payer: Medicare Other | Admitting: Lab

## 2012-01-01 ENCOUNTER — Ambulatory Visit (HOSPITAL_BASED_OUTPATIENT_CLINIC_OR_DEPARTMENT_OTHER): Payer: Medicare Other | Admitting: Physician Assistant

## 2012-01-01 VITALS — BP 143/78 | HR 82 | Temp 98.2°F | Ht 65.0 in | Wt 263.6 lb

## 2012-01-01 DIAGNOSIS — Z951 Presence of aortocoronary bypass graft: Secondary | ICD-10-CM

## 2012-01-01 DIAGNOSIS — C50919 Malignant neoplasm of unspecified site of unspecified female breast: Secondary | ICD-10-CM

## 2012-01-01 DIAGNOSIS — I2510 Atherosclerotic heart disease of native coronary artery without angina pectoris: Secondary | ICD-10-CM

## 2012-01-01 DIAGNOSIS — Z170 Estrogen receptor positive status [ER+]: Secondary | ICD-10-CM

## 2012-01-01 LAB — CBC WITH DIFFERENTIAL/PLATELET
Basophils Absolute: 0 10*3/uL (ref 0.0–0.1)
Eosinophils Absolute: 0.3 10*3/uL (ref 0.0–0.5)
HGB: 11.5 g/dL — ABNORMAL LOW (ref 11.6–15.9)
LYMPH%: 24.5 % (ref 14.0–49.7)
MONO#: 0.6 10*3/uL (ref 0.1–0.9)
NEUT#: 3.6 10*3/uL (ref 1.5–6.5)
Platelets: 276 10*3/uL (ref 145–400)
RBC: 4.23 10*6/uL (ref 3.70–5.45)
WBC: 6 10*3/uL (ref 3.9–10.3)
nRBC: 0 % (ref 0–0)

## 2012-01-01 MED ORDER — ANASTROZOLE 1 MG PO TABS: 1.0000 mg | ORAL_TABLET | Freq: Every day | ORAL | Status: AC

## 2012-01-01 NOTE — Progress Notes (Signed)
ID: Jamie [name]   DOB: 08-04-1946  MR#: [id number]  WUJ#:[id number]  HISTORY OF PRESENT ILLNESS: The patient noted a mass in her left breast and brought it to the attention of her primary physician, Dr. Joetta Manners, who set her up for diagnostic mammography. This showed a mass of about 3 cm in diameter (I do not have a copy of that study or comparison with prior mammography from July of 2012). The patient was referred to Dr. Carolynne Edouard and he set her up for biopsy of the mass 08/28/2011 [facility]. The pathology showed [phone]) an invasive ductal carcinoma, grade 2, estrogen receptor 100% positive, progesterone receptor 96% positive, with an MIB-1 of 30%, and HER-2 equivocal with the ratio by C[facility] of 1.81.  Diagnostic right mammography was then performed 08/28/2011. Multiple masses were noted, and there were multiple soft tissue nodules by palpation. Ultrasound specifically showed 2 hypoechoic round masses measuring 1.3 and 0.9 cm respectively. 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 With this information and after extensive discussion the patient opted for bilateral mastectomies with left sentinel lymph node sampling, and this was performed 10/18/2011, with results and further treatment as discussed below.  INTERVAL HISTORY: Jamie [name] returns today accompanied by her husband, Jamie [name], for follow up of her left breast cancer.  Interval history is remarkable for recent hospitalization for CAD. The patient began her first treatment of doxorubicin/cyclophosphamide on May 6, the doxorubicin given by continuous infusion. It was discontinued, however, after proximally 6 hours when the patient was admitted with unstable angina. She subsequently underwent CABG on 12/13/2011 for which she is recovering well. She returns today to discuss her options with regards to chemotherapy, and over 50% of our 45 minute appointment today was spent reviewing her  options and coordinating care.    REVIEW OF SYSTEMS: Jamie [name] is mildly fatigued, but is actually feeling a little better every day. She is walking a little every day, and trying to increase her activity. If anything, she feels like her shortness of breath has improved since her recent procedure. She has had no significant chest pain or palpitations.  Jamie [name] denies any fevers, chills, or night sweats. No significant nausea. No emesis. No change in bowel habits. No abnormal headaches or dizziness. She does have some discomfort in the chest wall secondary to her expanders, but denies any additional myalgias, arthralgias, or bony pain. No significant peripheral swelling.  A detailed view of systems is otherwise noncontributory.  PAST MEDICAL HISTORY: Past Medical History  Diagnosis Date  . Left breast mass   . Asthma   . COPD (chronic obstructive pulmonary disease)   . Shortness of breath   . Sleep apnea     sleep study2-3 yrs ago lost weight afterward but gained back  . UTI (lower urinary tract infection)     freq-bladder implant -removed  . lt breast ca dx'd 08/2011  . Headache     hx migraines  hx tobacco abuse, 80 P/Y, resolved  PAST SURGICAL HISTORY: Past Surgical History  Procedure Date  . Cesarean section 04/1976  . Abdominal hysterectomy 1977  . Laparoscopic gastric banding 2010  . Nasal sinus surgery   . Tonsillectomy   . Appendectomy   . Mastectomy w/ sentinel node biopsy 10/18/2011    Procedure: MASTECTOMY WITH SENTINEL LYMPH NODE BIOPSY;  Surgeon: Robyne Askew, MD;  Location: [facility] OR;  Service: General;  Laterality: Bilateral;  bilateral mastectomy and left sentinel node  biopsy  . Axillary lymph node dissection 10/18/2011    Procedure: AXILLARY LYMPH NODE DISSECTION;  Surgeon: Robyne Askew, MD;  Location: [facility] OR;  Service: General;  Laterality: Left;  . Breast reconstruction 10/18/2011    Procedure: BREAST RECONSTRUCTION;  Surgeon: Wayland Denis, DO;  Location: [facility] OR;   Service: Plastics;  Laterality: Bilateral;   bilateral breast reconstruction with bilateral tissue expander and placement of flex hd.  . US echocardiography     at [facility]  . Portacath placement 12/02/2011    Procedure: INSERTION PORT-A-CATH;  Surgeon: Robyne Askew, MD;  Location: [facility] OR;  Service: General;  Laterality: Right;  . Coronary artery bypass graft 12/13/2011    Procedure: CORONARY ARTERY BYPASS GRAFTING (CABG);  Surgeon: Alleen Borne, MD;  Location: [facility] OR;  Service: Open Heart Surgery;  Laterality: N/A;  Times four using endoscopically harvested left greater saphenous vein and left internal mammary artery. Right greater saphenous vein attempted; not appropriate for vein harvest.  no salpingo-oophorectomy  FAMILY HISTORY Family History  Problem Relation Age of Onset  . Anesthesia problems Neg Hx   . Hypotension Neg Hx   . Malignant hyperthermia Neg Hx   . Pseudochol deficiency Neg Hx   The patient's father died at the age of 25. He had a history of asbestos exposure. The patient's mother died at the age of 75 from heart disease. The patient had one brother, no sisters. There is no breast or ovarian cancer in the immediate family but 2 of the patient's 5 maternal aunts had breast cancer (she does not know the age of diagnosis), and one of her father's sisters had ovarian cancer.  GYNECOLOGIC HISTORY: She had menarche age 42, no firm date for her menopause as she had hysterectomy remotely. She is GX P2, with a pair of twins and a daughter as her biological children as noted below. She never took hormone replacement.  SOCIAL HISTORY: She owns a tuxedo shop. Her husband of 34 years, Jamie [name], is retired but used to own a Designer, television/film set. He is currently working in IT with one of his own sons. At home in addition to the patient and her husband is one of the patient's daughters and her 2 children, as well as one of Jamie [name] sons. Altogether they have 6 children and 15 grandchildren. The  patient attends [employer]    ADVANCED DIRECTIVES:  HEALTH MAINTENANCE: History  Substance Use Topics  . Smoking status: Former Smoker -- 2.0 packs/day    Quit date: 08/25/2006  . Smokeless tobacco: Never Used   Comment: social drinker 2-3 yr  . Alcohol Use: No     rarely     Colonoscopy: 2012  PAP: s/p hysterectomy  Bone density:  Lipid panel:  Allergies  Allergen Reactions  . Vicodin (Hydrocodone-Acetaminophen) Other (See Comments)    syncope  . Sulfa Antibiotics Other (See Comments)    unknown    Current Outpatient Prescriptions  Medication Sig Dispense Refill  . oxyCODONE (OXY IR/ROXICODONE) 5 MG immediate release tablet       . albuterol (PROVENTIL HFA;VENTOLIN HFA) 108 (90 BASE) MCG/ACT inhaler Inhale 2 puffs into the lungs every 6 (six) hours as needed. For wheezing      . atorvastatin (LIPITOR) 80 MG tablet Take 1 tablet (80 mg total) by mouth daily at 6 PM.  30 tablet  1  . furosemide (LASIX) 40 MG tablet Take 1 tablet (40 mg total) by mouth daily. X 1 week  7 tablet  0  . gabapentin (NEURONTIN) 300 MG capsule Take 1 capsule (300 mg total) by mouth 3 (three) times daily.  60 capsule  3  . lidocaine-prilocaine (EMLA) cream Apply 1 application topically as needed. Port cath      . lisinopril (PRINIVIL,ZESTRIL) 5 MG tablet Take 1 tablet (5 mg total) by mouth daily.  30 tablet  1  . loratadine (CLARITIN) 10 MG tablet Take 10 mg by mouth daily.      Marland Kitchen LORazepam (ATIVAN) 0.5 MG tablet Take 0.5 mg by mouth at bedtime as needed.      . metFORMIN (GLUCOPHAGE) 500 MG tablet Take 1 tablet (500 mg total) by mouth 2 (two) times daily with a meal.  60 tablet  1  . metoprolol tartrate (LOPRESSOR) 50 MG tablet Take 1 tablet (50 mg total) by mouth 2 (two) times daily.  60 tablet  1  . potassium chloride SA (K-DUR,KLOR-CON) 20 MEQ tablet Take 1 tablet (20 mEq total) by mouth daily. X 1 week  7 tablet  0  . prochlorperazine (COMPAZINE) 10 MG tablet Take 10 mg by mouth every  6 (six) hours. Nausea      . tiotropium (SPIRIVA) 18 MCG inhalation capsule Place 18 mcg into inhaler and inhale daily.      Marland Kitchen triamcinolone cream (KENALOG) 0.1 % Apply 1 application topically 2 (two) times daily.         OBJECTIVE: Middle-aged white woman in no acute distress Filed Vitals:   01/01/12 1347  BP: 143/78  Pulse: 82  Temp: 98.2 F (36.8 C)     Body mass index is 43.87 kg/(m^2).    ECOG FS: 2  Filed Weights   01/01/12 1347  Weight: 263 lb 9.6 oz (119.568 kg)   Physical Exam: HEENT:  Sclerae anicteric, conjunctivae pink.  Oropharynx clear.     Nodes:  No cervical, supraclavicular, or axillary lymphadenopathy palpated.  Breast Exam:  Deferred   Lungs:  Clear to auscultation bilaterally.  No crackles, rhonchi, or wheezes.   Heart:  Regular rate and rhythm.   Abdomen:  Soft, obese, nontender.  Positive bowel sounds.   Musculoskeletal:  No focal spinal tenderness to palpation.  Extremities:  Benign.  No peripheral edema or cyanosis.   Skin:  Benign.   Neuro:  Nonfocal. Alert and oriented x3.    LAB RESULTS: Lab Results  Component Value Date   WBC 6.0 01/01/2012   NEUTROABS 3.6 01/01/2012   HGB 11.5* 01/01/2012   HCT 36.2 01/01/2012   MCV 85.6 01/01/2012   PLT 276 01/01/2012      Chemistry      Component Value Date/Time   NA 137 12/16/2011 0430   K 4.1 12/16/2011 0430   CL 103 12/16/2011 0430   CO2 28 12/16/2011 0430   BUN 13 12/16/2011 0430   CREATININE 0.73 12/16/2011 0430      Component Value Date/Time   CALCIUM 8.5 12/16/2011 0430   ALKPHOS 67 12/12/2011 0900   AST 48* 12/12/2011 0900   ALT 47* 12/12/2011 0900   BILITOT 0.5 12/12/2011 0900       Lab Results  Component Value Date   LABCA2 27 09/03/2011     STUDIES:  Echocardiogram was obtained at [facility] on 09/03/2011, with an ejection fraction of 65%.   ASSESSMENT: 66 year-old [name] woman   (1)  s/p bilateral mastectomies and left axillary lymph node dissection  10/18/2011 for a left-sided pT2 pN1, stage  IIB invasive ductal carcinoma, grade 2, estrogen receptor 100% and progesterone receptor 96% positive, with no HER-2 amplification (1.81, repeat 1.35), Mib-1 of 30%. The right breast showed no malignancy.  (2) Plan was to treat in the adjuvant setting with 4 dose dense cycles of doxorubicin/cyclophosphamide followed by 4 dose dense cycles of paclitaxel. Doxorubicin given by continuous infusion over 72 hours, with Neulasta given at the time of pump removal. Patient received a truncated cycle 1, the continuous infusion disconnected after proximally 6 hours when the patient was admitted to the hospital with unstable angina.  (3)  During hospitalization, CT angio negative for PE, echo showing well-preserved EF, enzymes/ECG showing a NSTEMI, s/p cath May 7 showing left main, LAD and RCA disease with a well-preserved ejection fraction, revascularization performed 12/13/2011 with an uneventful postoperative course   PLAN:  This case was reviewed with Dr. Darnelle [name] who also spoke extensively with this patient today. In light of her new CAD diagnosis in her recent procedures, he feels it would be prudent to forego chemotherapy, and begin an aromatase inhibitor immediately. She would likely get get only a marginal benefit from the chemotherapy, and would likely have a difficult time tolerating the treatment.   Dr. Darnelle [name] suggested trying anastrozole, 1 mg daily, and this was prescribed today. She will go ahead and start as soon as she has the prescription filled. We'll also order a new baseline bone density to be done in the next month or so.  I am also referring Jamie [name] back to Dr. Michell [name] to discuss radiation therapy since we are not proceeding with additional chemotherapy. We'll also refer her back to Dr.  Kelly [name] to discuss her reconstruction. She does still have both expanders in place. For now, we will flush Jamie [name]'s port every 6 weeks, but will hope to have the port  removed once she has scheduled her reconstructive surgery.   Jamie [name] will return to see Dr. Darnelle [name] in approximately 2 months to assess tolerance of the anastrozole. She will call in the meanwhile, however, with any changes or problems.    Jamie [name]    01/01/2012

## 2012-01-02 ENCOUNTER — Telehealth: Payer: Self-pay | Admitting: *Deleted

## 2012-01-02 NOTE — Progress Notes (Signed)
Encounter addended by: Delynn Flavin, RN on: 01/02/2012 11:22 AM<BR>     Documentation filed: Charges VN

## 2012-01-02 NOTE — Telephone Encounter (Signed)
left voice message to inform the patient of the new date and time for bone density at breast center on 01-23-2012

## 2012-01-03 ENCOUNTER — Telehealth: Payer: Self-pay | Admitting: *Deleted

## 2012-01-03 NOTE — Telephone Encounter (Signed)
June 18-2013 at 9:15am with dr.sanger left voice message to inform the patient of the new date and time for dr.claire sanger

## 2012-01-06 ENCOUNTER — Ambulatory Visit: Payer: Medicare Other

## 2012-01-06 ENCOUNTER — Ambulatory Visit: Payer: Medicare Other | Admitting: Physician Assistant

## 2012-01-06 ENCOUNTER — Other Ambulatory Visit: Payer: Medicare Other | Admitting: Lab

## 2012-01-06 ENCOUNTER — Other Ambulatory Visit: Payer: Self-pay | Admitting: Surgery

## 2012-01-06 ENCOUNTER — Telehealth: Payer: Self-pay | Admitting: *Deleted

## 2012-01-06 DIAGNOSIS — I2510 Atherosclerotic heart disease of native coronary artery without angina pectoris: Secondary | ICD-10-CM

## 2012-01-06 NOTE — Telephone Encounter (Signed)
Pt's husband called to this RN to state post start of " chemo pills " Jamie [name] developed nausea and diarrhea. Note medication per MD is anastrazole.  This RN informed Jamie [name] above not common side effect and symptoms may be more viral in cause. But do to onset at time of starting medication Jamie [name] should stop the anastrazole at this time - keep hydrated and call this RN with update on Wednesday.  Jamie [name] verbalized understanding.

## 2012-01-07 ENCOUNTER — Ambulatory Visit: Payer: Medicare Other | Admitting: Surgery

## 2012-01-08 ENCOUNTER — Encounter: Payer: Self-pay | Admitting: Radiation Oncology

## 2012-01-08 ENCOUNTER — Ambulatory Visit
Admission: RE | Admit: 2012-01-08 | Discharge: 2012-01-08 | Disposition: A | Discharge status: Home / Self Care | Payer: Medicare Other | Source: Ambulatory Visit | Attending: Radiation Oncology | Admitting: Radiation Oncology

## 2012-01-08 VITALS — BP 143/73 | HR 63 | Temp 97.6°F | Resp 18 | Ht 65.0 in | Wt 251.6 lb

## 2012-01-08 DIAGNOSIS — C50919 Malignant neoplasm of unspecified site of unspecified female breast: Secondary | ICD-10-CM

## 2012-01-08 NOTE — Progress Notes (Signed)
66 year old female. Married for 34 years to husband, Forrest. Owns tuxedo shop. Daughter, two grandchildren, and Forrest's son live with the patient and her husband.  T2 N1 invasive ductal carcinoma ER, PR positive and HER 2 equivocal of the left breast status post bilateral mastectomy.  Bilateral mastectomy with expanders placed 10/18/2011. Initial plan was to treat in the adjuvent setting with 4 dose dense cycles of doxorubicin/cyclophosphamide but, six hours into initial treatment patient began having angina and subsequently ended up having a CABG on 12/13/2011. Dr. Darnelle Catalan has decided to forgo chemotherapy and begin an aromatase inhibitor immediately.    AX: Vicodin and Sulfa antibiotic No hx of radiation therapy No indication of a pacemaker PCP Dr. Joetta Manners

## 2012-01-08 NOTE — Progress Notes (Signed)
Patient presented to the clinic today accompanied by her husband, [name], for a follow up new consult with Dr. Michell Heinrich reference post mastectomy radiation. Patient is alert and oriented to person, place, and time. No distress noted. Steady gait noted. Pleasant affect noted. Patient denies pain at this time. However, patient reports that her chest remains sore related to the effects of recent CABG surgery. Patient reports that she stopped taking her Arimidex a few days ago. Patient feels like the Arimidex is causing her nausea and diarrhea. Patient to see Zollie Scale today reference this matter. Patient reports a 40 lb weight loss since since just April. Patient reports a decreased appetite due to taste changes and nausea. Patient reports all incisions are closed without redness, warmth, edema or drainage. Patient to see cardiologist tomorrow and hopefully be released to begin physical therapy. Reported all findings to Dr. Michell Heinrich.

## 2012-01-08 NOTE — Progress Notes (Signed)
See progress note under physician encounter. 

## 2012-01-08 NOTE — Progress Notes (Signed)
Complete PATIENT MEASURE OF DISTRESS worksheet with a score of 5 submitted to social work. Patient denies need to see social worker today but, left number for them to call. Also, complete NUTRITION RISK SCREEN worksheet submitted to Zenovia Jarred, RD. Patient expressed a 40 lb weight loss since surgery in April. Darryl Nestle to schedule nutrition appointment with Zenovia Jarred, RD.

## 2012-01-08 NOTE — Progress Notes (Signed)
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  She started chemotherapy but unfortunately developed unstable angina and underwent a CABG on 12/13/2011. She has been recovering from this. Dr. Daneil Dolin not has decided not to pursue any further chemotherapy. She has been started on Arimidex but continues to have nausea and diarrhea. She has actually had  a 40 pound weight loss since April and her bilateral mastectomies. She is considering having her expanders removed. She is not too excited about pursuing radiation.                            ALLERGIES:  is allergic to vicodin and sulfa antibiotics.  Meds: Current Outpatient Prescriptions  Medication Sig Dispense Refill  . atorvastatin (LIPITOR) 80 MG tablet Take 1 tablet (80 mg total) by mouth daily at 6 PM.  30 tablet  1  . gabapentin (NEURONTIN) 300 MG capsule Take 1 capsule (300 mg total) by mouth 3 (three) times daily.  60 capsule  3  . lidocaine-prilocaine (EMLA) cream Apply 1 application topically as needed. Port cath      . lisinopril (PRINIVIL,ZESTRIL) 5 MG tablet Take 1 tablet (5 mg total) by mouth daily.  30 tablet  1  . loratadine (CLARITIN) 10 MG tablet Take 10 mg by mouth daily.      Marland Kitchen LORazepam (ATIVAN) 0.5 MG tablet Take 0.5 mg by mouth at bedtime as needed.      . metFORMIN (GLUCOPHAGE) 500 MG tablet Take 1 tablet (500 mg total) by mouth 2 (two) times daily with a meal.  60 tablet  1  . metoprolol tartrate (LOPRESSOR) 50 MG tablet Take 1 tablet (50 mg total) by mouth 2 (two) times daily.  60 tablet  1  . potassium chloride SA (K-DUR,KLOR-CON) 20 MEQ tablet Take 1 tablet (20 mEq total) by mouth daily. X 1 week  7 tablet  0  .  prochlorperazine (COMPAZINE) 10 MG tablet Take 10 mg by mouth every 6 (six) hours. Nausea      . tiotropium (SPIRIVA) 18 MCG inhalation capsule Place 18 mcg into inhaler and inhale daily.      Marland Kitchen triamcinolone cream (KENALOG) 0.1 % Apply 1 application topically 2 (two) times daily.       Marland Kitchen albuterol (PROVENTIL HFA;VENTOLIN HFA) 108 (90 BASE) MCG/ACT inhaler Inhale 2 puffs into the lungs every 6 (six) hours as needed. For wheezing      . anastrozole (ARIMIDEX) 1 MG tablet Take 1 tablet (1 mg total) by mouth daily.  30 tablet  5  . furosemide (LASIX) 40 MG tablet Take 1 tablet (40 mg total) by mouth daily. X 1 week  7 tablet  0  . oxyCODONE (OXY IR/ROXICODONE) 5 MG immediate release tablet         Physical Findings: The patient is in no acute distress. Patient is alert and oriented. . she has a healing midline sternotomy scar. She has bilateral expanders in place. Is alert and oriented x3.  height is 5\' 5"  (1.651 m) and weight is 251 lb 9.6 oz (114.125 kg). Her oral temperature is 97.6 F (36.4 C). Her blood  pressure is 143/73 and her pulse is 63. Her respiration is 18. .  No significant changes.  Lab Findings: Lab Results  Component Value Date   WBC 6.0 01/01/2012   HGB 11.5* 01/01/2012   HCT 36.2 01/01/2012   MCV 85.6 01/01/2012   PLT 276 01/01/2012      Radiographic Findings: Dg Chest 2 View  12/10/2011  *RADIOLOGY REPORT*  Clinical Data: Shortness of breath; history of breast cancer.  CHEST - 2 VIEW  Comparison: Chest radiograph performed 12/02/2011  Findings: The lungs are well-aerated.  Vascular congestion is noted.  Mild left basilar airspace opacity likely reflects atelectasis.  There is no definite evidence for significant pulmonary edema.  There is no evidence of pleural effusion or pneumothorax.  The heart is borderline normal in size; the mediastinal contour is within normal limits.  No acute osseous abnormalities are seen.  A right-sided chest port is noted ending about the distal  SVC. Bilateral breast implants are seen.  IMPRESSION: Vascular congestion noted; mild left basilar airspace opacity likely reflects atelectasis, though pneumonia could conceivably have a similar appearance.  Original Report Authenticated By: Tonia Ghent, M.D.   Ct Angio Chest W/cm &/or Wo Cm  12/10/2011  *RADIOLOGY REPORT*  Clinical Data: Bilateral mastectomy 2 weeks ago for breast cancer. Short of breath.  CT ANGIOGRAPHY CHEST  Technique:  Multidetector CT imaging of the chest using the standard protocol during bolus administration of intravenous contrast. Multiplanar reconstructed images including MIPs were obtained and reviewed to evaluate the vascular anatomy.  Contrast: OMNIPAQUE IOHEXOL 300 MG/ML  SOLN  Comparison: Chest x-ray 12/10/2011  Findings: Negative for pulmonary embolism.  No aortic aneurysm or dissection.  Mild atherosclerotic calcification of the coronary arteries.  No pericardial effusion.  Mild bibasilar atelectasis.  No significant pleural effusion. Negative for mass or pneumonia.  No adenopathy.  Bilateral breast reconstruction is noted.  Fatty infiltration of liver.  Gastric banding for obesity.  IMPRESSION: Negative for pulmonary embolism.  Mild bibasilar atelectasis.  Original Report Authenticated By: Camelia Phenes, M.D.   Dg Chest Portable 1 View In Am  12/15/2011  *RADIOLOGY REPORT*  Clinical Data: Postop CABG, mastectomy  PORTABLE CHEST - 1 VIEW  Comparison: Plain film 12/14/2011  Findings: Interval removal of Swan-Ganz catheter, left chest tube, and mediastinal drains.  Persistent left lower lobe atelectasis and effusion.  No pneumothorax.  Mild central venous congestion.    IMPRESSION: .1.  Removal of chest tubes without pneumothorax.  2.  Persistent left lower lobe atelectasis and effusion.  Original Report Authenticated By: Genevive Bi, M.D.   Dg Chest Portable 1 View In Am  12/14/2011  *RADIOLOGY REPORT*  Clinical Data: Postoperative radiograph.  PORTABLE CHEST -  1 VIEW  Comparison: 12/13/2011.  Findings: Right IJ Port-A-Cath is present with the tip in the SVC. Left IJ Swan-Ganz catheter with the tip in the main pulmonary artery.  Mediastinal and left chest drain.  Low volume chest with basilar atelectasis.  No convincing airspace disease.  Probable small left pleural effusion.  Left axillary dissection clips and bilateral breast implants noted.  No pneumothorax.  Interval extubation and removal of enteric tube.  The left thoracostomy tube has been withdrawn partially. Postop changes median sternotomy / CABG. Low volume chest accentuates the size of the cardiopericardial silhouette which is enlarged.  IMPRESSION:  1.  Interval extubation.  Other support apparatus as described above. 2.  Low lung volumes.  Original Report Authenticated By: Andreas Newport, M.D.   Dg Chest  Portable 1 View  12/13/2011  *RADIOLOGY REPORT*  Clinical Data: Status post CABG.  PORTABLE CHEST - 1 VIEW  Comparison: CT and plain films of the chest 12/10/2011.  Findings: Port-A-Cath is again noted.  Endotracheal tube is in place with tip in good position at the level of the clavicular heads.  NG tube courses into the stomach.  Tip of left IJ approach Swan-Ganz catheter is in the proximal right main pulmonary artery. Left chest tube and mediastinal drain are noted.  No pneumothorax. The patient has a small left effusion.  Bibasilar atelectasis is noted.  IMPRESSION:  1.  Support apparatus in good position.  No pneumothorax. 2.  Small left pleural effusion.  Bibasilar atelectasis.  Original Report Authenticated By: Bernadene Bell. Maricela Curet, M.D.    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 She had one lymph node with a macro metastases. No extracapsular extension was noted. 18 other lymph nodes were negative. Her tumor was  strongly estrogen and progesterone receptor positive. We discussed that the absence of chemotherapy may slightly increase her risk for postmastectomy chest wall failure. Maybe in the neighborhood of 20-25% instead of 15-20%. I still think overall her chance of failure remains low.  I do think a chest wall failure could be catastrophic however and the salvage treatment options are not very good. An antiestrogen and may provide her with some protection against local failure. We discussed again the complications are possible with her implants. I discussed with her that removing her expanders at this point was probably just about the same surgery as removing the expanders in placing permanent implants. I encouraged her to discuss this further with Dr. Kelly Splinter. In the end I think she's just been through a lot. I've given her my card and encouraged her husband and the patient to think about what they would like to do. She will give me a call if she is interested in pursuing radiation. We did talk about the possible side effects of treatment including but not limited to fatigue and skin irritation. I would simply treat her chest wall and supraclavicular fossa. I would not specifically target her axilla. This should decrease her risk for lymphedema.  _____________________________________

## 2012-01-09 ENCOUNTER — Ambulatory Visit (INDEPENDENT_AMBULATORY_CARE_PROVIDER_SITE_OTHER): Payer: Self-pay | Admitting: Surgery

## 2012-01-09 ENCOUNTER — Ambulatory Visit: Payer: Medicare Other

## 2012-01-09 ENCOUNTER — Ambulatory Visit
Admission: RE | Admit: 2012-01-09 | Discharge: 2012-01-09 | Disposition: A | Discharge status: Home / Self Care | Payer: Medicare Other | Source: Ambulatory Visit | Attending: Surgery | Admitting: Surgery

## 2012-01-09 ENCOUNTER — Encounter: Payer: Self-pay | Admitting: Surgery

## 2012-01-09 VITALS — BP 138/79 | HR 72 | Resp 18 | Ht 65.0 in | Wt 251.0 lb

## 2012-01-09 DIAGNOSIS — I2510 Atherosclerotic heart disease of native coronary artery without angina pectoris: Secondary | ICD-10-CM

## 2012-01-09 DIAGNOSIS — Z09 Encounter for follow-up examination after completed treatment for conditions other than malignant neoplasm: Secondary | ICD-10-CM

## 2012-01-09 NOTE — Progress Notes (Signed)
[address]            [place] [address]          [phone]     HPI:  Patient returns for routine postoperative follow-up having undergone coronary bypass graft surgery x4 on 12/13/2011. The patient's early postoperative recovery while in the hospital was notable for an uncomplicated postoperative recovery. Since hospital discharge the patient reports that her breathing has been much better than it has been in a long time. She has had no significant chest discomfort. She has been walking several times per day. Her only complaint is that she has about a six-day history of nausea, poor appetite, and diarrhea which she thought was probably secondary to a GI virus. These symptoms have been fairly consistent and have not gotten any better.   Current Outpatient Prescriptions  Medication Sig Dispense Refill  . albuterol (PROVENTIL HFA;VENTOLIN HFA) 108 (90 BASE) MCG/ACT inhaler Inhale 2 puffs into the lungs every 6 (six) hours as needed. For wheezing      . anastrozole (ARIMIDEX) 1 MG tablet Take 1 tablet (1 mg total) by mouth daily.  30 tablet  5  . aspirin 325 MG EC tablet Take 325 mg by mouth daily.      Marland Kitchen atorvastatin (LIPITOR) 80 MG tablet Take 1 tablet (80 mg total) by mouth daily at 6 PM.  30 tablet  1  . escitalopram (LEXAPRO) 5 MG tablet Take 5 mg by mouth daily.      Marland Kitchen gabapentin (NEURONTIN) 300 MG capsule Take 1 capsule (300 mg total) by mouth 3 (three) times daily.  60 capsule  3  . lidocaine-prilocaine (EMLA) cream Apply 1 application topically as needed. Port cath      . lisinopril (PRINIVIL,ZESTRIL) 5 MG tablet Take 5 mg by mouth daily.      Marland Kitchen loratadine (CLARITIN) 10 MG tablet Take 10 mg by mouth daily.      . metFORMIN (GLUCOPHAGE) 500 MG tablet Take 1 tablet (500 mg total) by mouth 2 (two) times daily with a meal.  60 tablet  1  . metoprolol tartrate (LOPRESSOR) 50 MG tablet Take 1 tablet (50 mg total) by mouth 2 (two) times daily.  60  tablet  1  . oxyCODONE (OXY IR/ROXICODONE) 5 MG immediate release tablet       . prochlorperazine (COMPAZINE) 10 MG tablet Take 10 mg by mouth every 6 (six) hours. Nausea      . tiotropium (SPIRIVA) 18 MCG inhalation capsule Place 18 mcg into inhaler and inhale daily.      Marland Kitchen LORazepam (ATIVAN) 0.5 MG tablet Take 0.5 mg by mouth at bedtime as needed.        Physical Exam:  BP 138/79  Pulse 72  Resp 18  Ht 5\' 5"  (1.651 m)  Wt 251 lb (113.853 kg)  BMI 41.77 kg/m2  SpO2 93% She looks well. Cardiac exam shows a regular rate and rhythm with normal heart sounds. Lung exam is clear. The chest incision is healing well and sternum stable. Both leg incisions are healing well and there is no significant lower showing edema. Abdominal exam shows normal bowel sounds, soft and nontender  Diagnostic Tests: *RADIOLOGY REPORT*   Clinical Data: Shortness of breath, cough, former smoking history   CHEST - 2 VIEW   Comparison: Portable chest x-ray of 12/15/2011   Findings: The lungs are  better aerated.  Opacity at the left lung base has improved with only a small left effusion remaining. Cardiomegaly is stable.  Right-sided Port-A-Cath is unchanged in position and median sternotomy sutures are noted.  Bilateral breast implants are again noted. Also, there does appear to be a lap band present.   IMPRESSION: Improved aeration with only a small left effusion and mild left basilar atelectasis remaining.   Original Report Authenticated By: Juline Patch, M.D.        Impression:  Overall I think she is making a good recovery following her surgery. I doubt that her GI symptoms are related to a virus if they're lasting for 6 days and not improved at all. She said that Dr. Darnelle Catalan temporarily discontinued her chemotherapy although he did not feel that that was the cause of her symptoms. I reviewed her medications and she said that she was started on Lexapro about 2 weeks ago and developed these  symptoms about one week after starting that medication. All of her symptoms are common side effects of Lexapro and I asked her to discontinue that to see if the symptoms resolve. She has not been on this drug very long so I think she should be okay stopping it.  I told her she can return to driving a car at this time but should refrain from lifting anything heavier than 10 pounds for a total of 3 months date of surgery.  Plan:  She will continue followup with her medical and radiation oncologists and I asked her to make an appointment for followup with the [facility] cardiology. She will return to see me if he develops any problems with her incisions.

## 2012-01-13 ENCOUNTER — Other Ambulatory Visit: Payer: Medicare Other | Admitting: Lab

## 2012-01-13 ENCOUNTER — Ambulatory Visit: Payer: Medicare Other | Admitting: Physician Assistant

## 2012-01-14 NOTE — Progress Notes (Signed)
Encounter addended by: Tessa Lerner, RN on: 01/14/2012 10:26 AM<BR>     Documentation filed: Charges VN

## 2012-01-20 ENCOUNTER — Ambulatory Visit: Payer: Medicare Other

## 2012-01-20 ENCOUNTER — Other Ambulatory Visit: Payer: Medicare Other | Admitting: Lab

## 2012-01-20 ENCOUNTER — Ambulatory Visit (INDEPENDENT_AMBULATORY_CARE_PROVIDER_SITE_OTHER): Payer: Medicare Other | Admitting: Physician Assistant

## 2012-01-20 ENCOUNTER — Ambulatory Visit: Payer: Medicare Other | Admitting: Physician Assistant

## 2012-01-20 ENCOUNTER — Encounter: Payer: Self-pay | Admitting: Physician Assistant

## 2012-01-20 ENCOUNTER — Telehealth: Payer: Self-pay | Admitting: Physician Assistant

## 2012-01-20 VITALS — BP 132/70 | HR 69 | Ht 65.0 in | Wt 254.0 lb

## 2012-01-20 DIAGNOSIS — E119 Type 2 diabetes mellitus without complications: Secondary | ICD-10-CM

## 2012-01-20 DIAGNOSIS — I2510 Atherosclerotic heart disease of native coronary artery without angina pectoris: Secondary | ICD-10-CM

## 2012-01-20 DIAGNOSIS — R7989 Other specified abnormal findings of blood chemistry: Secondary | ICD-10-CM

## 2012-01-20 DIAGNOSIS — I10 Essential (primary) hypertension: Secondary | ICD-10-CM

## 2012-01-20 DIAGNOSIS — E7800 Pure hypercholesterolemia, unspecified: Secondary | ICD-10-CM

## 2012-01-20 LAB — BASIC METABOLIC PANEL
BUN: 13 mg/dL (ref 6–23)
Calcium: 9.3 mg/dL (ref 8.4–10.5)
Creatinine, Ser: 0.7 mg/dL (ref 0.4–1.2)
GFR: 94.83 mL/min (ref >60.00)

## 2012-01-20 LAB — HEPATIC FUNCTION PANEL
ALT: 30 U/L (ref 0–35)
Bilirubin, Direct: 0 mg/dL (ref 0.0–0.3)
Total Bilirubin: 0.2 mg/dL — ABNORMAL LOW (ref 0.3–1.2)

## 2012-01-20 MED ORDER — LISINOPRIL 5 MG PO TABS: 5.0000 mg | ORAL_TABLET | Freq: Every day | ORAL | Status: DC

## 2012-01-20 MED ORDER — METOPROLOL TARTRATE 50 MG PO TABS: 50.0000 mg | ORAL_TABLET | Freq: Two times a day (BID) | ORAL | Status: DC

## 2012-01-20 MED ORDER — ATORVASTATIN CALCIUM 80 MG PO TABS: 80.0000 mg | ORAL_TABLET | Freq: Every day | ORAL | Status: DC

## 2012-01-20 NOTE — Telephone Encounter (Signed)
Thurston Hole I forgot to arrange this when patient was here. She has 40-59% bilat ICA stenosis on pre-CABG dopplers. When she comes in to see Dr. Shirlee Latch in 6 weeks, please get her set up for repeat dopplers in one year. Thanks [place], [place]  4:45 PM 01/20/2012

## 2012-01-20 NOTE — Patient Instructions (Addendum)
Your physician recommends that you have lab work today: BMP and LIVER  Your physician recommends that you have a FASTING LIPID and LIVER Profile in 4-6 WEEKS with your primary care provider.  You have been referred for NUTRITION CONSULT.  Your physician recommends that you schedule a follow-up appointment in: 6 WEEKS with Dr Shirlee Latch  Your physician recommends that you continue on your current medications as directed. Please refer to the Current Medication list given to you today.

## 2012-01-20 NOTE — Progress Notes (Signed)
[address] [place], [place]  [address] Phone: [phone] Fax:  [phone]  Date:  01/20/2012   Name:  Jamie [name]   DOB:  [birth year]   MRN:  [id number]  PCP:  Raynelle Jan., MD  Primary Cardiologist:  Dr. [name]  Primary Electrophysiologist:  None    History of Present Illness: Jamie [name] is a 66 y.o. female who returns for post-hospital follow up.  She has a history of recently diagnosed breast cancer, s/p bilateral mastectomies and recent initiation of chemotherapy, DM2, sleep apnea, COPD, obesity, HL.  She was recently admitted with diastolic CHF in the setting of NSTEMI.  Chest CT was negative for pulmonary embolism.  LHC demonstrated oLM 75%, pLAD 80%, RCA occluded, filled by collaterals from the LAD.  Echo 12/10/11: EF 60%.  She was referred for bypass surgery.  It was felt that her chemotherapy could be placed on hold until recovery from her surgery.  Pre-CABG Dopplers: Bilateral 40-59%;  ABIs > 1 bilaterally and indicative of bilateral calcified vessels.  CABG 12/13/11: LIMA-LAD, SVG-RCA, SVG-diagonal and OM.  Postop course was fairly uneventful.  She remained in sinus rhythm.  Follow up with Dr. Laneta Simmers 01/09/12.  Chest x-ray was stable.  She is progressing well.  Chest still sore.  Breathing much better.  No orthopnea, PND.  No edema.  She has a lot of questions about diet.  We discussed this briefly.  She was placed on metformin and wants to know if she should continue.  She checks her sugars and they typically run in the 90s.  Has not seen her PCP yet.  Also wants to know if she should stay on Lipitor, metoprolol and lisinopril.    Wt Readings from Last 3 Encounters:  01/20/12 254 lb (115.214 kg)  01/09/12 251 lb (113.853 kg)  01/08/12 251 lb 9.6 oz (114.125 kg)     Potassium  Date/Time Value Range Status  12/16/2011  4:30 AM 4.1  3.5 - 5.1 mEq/L Final     Creatinine, Ser  Date/Time Value Range Status  12/16/2011  4:30 AM 0.73  0.50 -  1.10 mg/dL Final     ALT  Date/Time Value Range Status  12/12/2011  9:00 AM 47* 0 - 35 U/L Final     HGB  Date/Time Value Range Status  01/01/2012  1:18 PM 11.5* 11.6 - 15.9 g/dL Final     Hemoglobin  Date/Time Value Range Status  12/16/2011  4:30 AM 9.7* 12.0 - 15.0 g/dL Final    Past Medical History  Diagnosis Date  . Asthma   . COPD (chronic obstructive pulmonary disease)   . Sleep apnea     sleep study2-3 yrs ago lost weight afterward but gained back  . UTI (lower urinary tract infection)     freq-bladder implant -removed  . Headache     hx migraines  . Breast cancer 08/2011    s/p bilat mastectomies   . CAD (coronary artery disease)     NSTEMI 5/13:  LHC demonstrated oLM 75%, pLAD 80%, RCA occluded, filled by collaterals from the LAD.  Echo 12/10/11: EF 60%.; CABG 12/13/11: LIMA-LAD, SVG-RCA, SVG-diagonal and OM.   Marland Kitchen HLD (hyperlipidemia)   . DM2 (diabetes mellitus, type 2)   . Carotid stenosis     Pre-CABG Dopplers 5/13: Bilateral 40-59%    Current Outpatient Prescriptions  Medication Sig Dispense Refill  . albuterol (PROVENTIL HFA;VENTOLIN HFA) 108 (90 BASE) MCG/ACT inhaler Inhale 2 puffs into the  lungs every 6 (six) hours as needed. For wheezing      . anastrozole (ARIMIDEX) 1 MG tablet Take 1 tablet (1 mg total) by mouth daily.  30 tablet  5  . aspirin 325 MG EC tablet Take 325 mg by mouth daily.      Marland Kitchen atorvastatin (LIPITOR) 80 MG tablet Take 1 tablet (80 mg total) by mouth daily at 6 PM.  30 tablet  1  . escitalopram (LEXAPRO) 5 MG tablet Take 5 mg by mouth daily.      Marland Kitchen gabapentin (NEURONTIN) 300 MG capsule Take 1 capsule (300 mg total) by mouth 3 (three) times daily.  60 capsule  3  . lidocaine-prilocaine (EMLA) cream Apply 1 application topically as needed. Port cath      . lisinopril (PRINIVIL,ZESTRIL) 5 MG tablet Take 5 mg by mouth daily.      Marland Kitchen loratadine (CLARITIN) 10 MG tablet Take 10 mg by mouth daily.      Marland Kitchen LORazepam (ATIVAN) 0.5 MG tablet Take 0.5 mg by  mouth at bedtime as needed.      . metFORMIN (GLUCOPHAGE) 500 MG tablet Take 1 tablet (500 mg total) by mouth 2 (two) times daily with a meal.  60 tablet  1  . metoprolol tartrate (LOPRESSOR) 50 MG tablet Take 1 tablet (50 mg total) by mouth 2 (two) times daily.  60 tablet  1  . oxyCODONE (OXY IR/ROXICODONE) 5 MG immediate release tablet       . prochlorperazine (COMPAZINE) 10 MG tablet Take 10 mg by mouth every 6 (six) hours. Nausea      . tiotropium (SPIRIVA) 18 MCG inhalation capsule Place 18 mcg into inhaler and inhale daily.        Allergies: Allergies  Allergen Reactions  . Vicodin (Hydrocodone-Acetaminophen) Other (See Comments)    syncope  . Sulfa Antibiotics Other (See Comments)    Unknown; childhood allergy    History  Substance Use Topics  . Smoking status: Former Smoker -- 2.0 packs/day    Types: Cigarettes    Quit date: 08/25/2006  . Smokeless tobacco: Never Used   Comment: social drinker 2-3 yr  . Alcohol Use: Yes     rarely     ROS:  Please see the history of present illness.     All other systems reviewed and negative.   PHYSICAL EXAM: VS:  BP 132/70  Pulse 69  Ht 5\' 5"  (1.651 m)  Wt 254 lb (115.214 kg)  BMI 42.27 kg/m2 Well nourished, well developed, in no acute distress HEENT: normal Neck: no JVD Cardiac:  normal S1, S2; RRR; 1/6 systolic murmur at RUSB Chest:  Median sternotomy well healed Lungs:  clear to auscultation bilaterally, no wheezing, rhonchi or rales Abd: soft, nontender, no hepatomegaly Ext: no edema Skin: warm and dry Neuro:  CNs 2-12 intact, no focal abnormalities noted  EKG:  Sinus rhythm, heart rate 69, normal axis, inferior Q waves with inferior T-wave inversions, no change from prior tracing   ASSESSMENT AND PLAN:  1.  Coronary Artery Disease Doing well post bypass. We had a long discussion about the importance of her medications.  These will be refilled. She would prefer to continue to increase her activity on her own.  She  defers cardiac rehabilitation.  She has been exercising in the pool. She had a lot of questions about diet.  We discussed this in detail.  Refer to nutritionist (diabetes, CAD). F/u with Dr. [name] in 6 weeks.  2.  Chronic Diastolic Congestive Heart Failure Volume stable. We discussed the importance of weighing and when to call us.  3.  Hypertension Controlled.  Continue current therapy.  Check bmet today.   4.  Diabetes Mellitus Managed by PCP.. I encouraged her to continue metformin and to follow up with PCP.  She will also be referred to nutrition.  5.  Hyperlipidemia She prefers to arrange follow up with her PCP. Lipids and LFTs should be checked in 4-6 weeks.  6.  Elevated LFTs Mildly elevated in the hospital. Will repeat LFTs today as she is now on high dose Lipitor.  7.  Breast Cancer Followed by oncology. She plans to hold off on restarting chemoTx for now.  8.  Carotid Stenosis She will need follow up dopplers arranged in one year.   Signed, Tereso Newcomer, PA-C  9:01 AM 01/20/2012

## 2012-01-23 ENCOUNTER — Other Ambulatory Visit: Payer: Medicare Other

## 2012-01-23 ENCOUNTER — Ambulatory Visit: Payer: Medicare Other

## 2012-01-24 ENCOUNTER — Ambulatory Visit (HOSPITAL_BASED_OUTPATIENT_CLINIC_OR_DEPARTMENT_OTHER): Payer: Medicare Other

## 2012-01-24 VITALS — BP 143/76 | HR 73 | Temp 97.0°F

## 2012-01-24 DIAGNOSIS — C50919 Malignant neoplasm of unspecified site of unspecified female breast: Secondary | ICD-10-CM

## 2012-01-24 DIAGNOSIS — Z452 Encounter for adjustment and management of vascular access device: Secondary | ICD-10-CM

## 2012-01-24 MED ORDER — HEPARIN SOD (PORK) LOCK FLUSH 100 UNIT/ML IV SOLN
500.0000 [IU] | Freq: Once | INTRAVENOUS | Status: AC
  Administered 2012-01-24: 500 [IU] via INTRAVENOUS
  Filled 2012-01-24: qty 5

## 2012-01-24 MED ORDER — SODIUM CHLORIDE 0.9 % IJ SOLN
10.0000 mL | INTRAMUSCULAR | Status: DC | PRN
  Administered 2012-01-24: 10 mL via INTRAVENOUS
  Filled 2012-01-24: qty 10

## 2012-01-27 ENCOUNTER — Other Ambulatory Visit: Payer: Medicare Other | Admitting: Lab

## 2012-01-27 ENCOUNTER — Ambulatory Visit: Payer: Medicare Other | Admitting: Physician Assistant

## 2012-02-18 ENCOUNTER — Other Ambulatory Visit: Payer: Self-pay | Admitting: Physician Assistant

## 2012-02-20 ENCOUNTER — Encounter: Payer: Medicare Other | Attending: Family Medicine | Admitting: *Deleted

## 2012-02-20 ENCOUNTER — Encounter: Payer: Self-pay | Admitting: *Deleted

## 2012-02-20 VITALS — Ht 65.0 in | Wt 252.0 lb

## 2012-02-20 DIAGNOSIS — E669 Obesity, unspecified: Secondary | ICD-10-CM | POA: Insufficient documentation

## 2012-02-20 DIAGNOSIS — E785 Hyperlipidemia, unspecified: Secondary | ICD-10-CM | POA: Insufficient documentation

## 2012-02-20 DIAGNOSIS — E119 Type 2 diabetes mellitus without complications: Secondary | ICD-10-CM | POA: Insufficient documentation

## 2012-02-20 DIAGNOSIS — Z713 Dietary counseling and surveillance: Secondary | ICD-10-CM | POA: Insufficient documentation

## 2012-02-20 NOTE — Progress Notes (Signed)
  Medical Nutrition Therapy:  Appt start time: 1015 end time:  1115.   Assessment:  Primary concerns today: obesity, hyperlipidemia.  Patient is more concerned with heart healthy eating than diabetic meal plans.   She was diagnosed with DM several years ago, but was not educated or treated for diabetes because she was scheduled for a lap band placement.  After lap band her glucose levels normalized with weight loss.  She gained weight back, but glucose levels continued to remain stable. Recent diagnosis of  breast cancer and subsequent mastectomy and chemo treatment cause slight spike in glucose, then she had a heart attack which cause glucose to reach 500 mg/dl.  She is not currently taking her metformin because she ran out and needs a new prescription.    MEDICATIONS: see list   DIETARY INTAKE:  Usual eating pattern includes 3 meals and 2-3 snacks per day.  Everyday foods include meats, starches, fruits, vegetables .  Avoided foods include sweets, not much bread  24-hr recall:  B ( AM): raisin bran or rice krispies with 2% or mini bagel with cantaloupe   Snk ( AM): watermelon or cantaloupe; sometimes Nabs L ( PM): sandwich on whole wheat (bologna and tomato) Snk ( PM): watermelon or cantaloupe; sometimes Nabs D ( PM): vegetables, meat, cornbread Snk ( PM): frozen yogurt with nuts and pineapple 1 time a week; sometimes pie; peppermint patties Beverages: water mostly, diet soda  Usual physical activity: swims some  Estimated energy needs: 1400 calories 158 g carbohydrates 105 g protein 39 g fat  Progress Towards Goal(s):  In progress.   Nutritional Diagnosis:  NB-1.1 Food and nutrition-related knowledge deficit As related to proper balance of carbohydrates, proteins, and fats.  As evidenced by type two diabetes, obesity, hyperlipidemia, and recent heart attack.    Intervention:  Nutrition counseling provided.  Discussed carb counting and what food affect blood sugar.  Encouraged  Jamie [name] to count her fruits as part of her carbohydrate intake.  Also discussed limiting portion of meats and choosing leaner cuts of meat to reduce risk of subsequent heart attack.  Encouraged more no-starchy vegetables and daily physical activity. Discussed MyPlate for meal planning.  Discouraged consumption of high cholesterol foods like eggs, shrimp, and scallops.    Handouts given during visit include: Living Well with Diabetes Carb Counting and Food Label handouts Meal Plan Card  Dest[facility] Heart Healthy Eating and low sodium flavoring tips  Monitoring/Evaluation:  Dietary intake, exercise, BGM, and body weight in 4 month(s).  Patient is having surgery and then [place] of the [place] in next [place].  Needs later appointment date

## 2012-02-24 ENCOUNTER — Telehealth: Payer: Self-pay | Admitting: Cardiology

## 2012-02-24 ENCOUNTER — Other Ambulatory Visit (HOSPITAL_BASED_OUTPATIENT_CLINIC_OR_DEPARTMENT_OTHER): Payer: Medicare Other | Admitting: Lab

## 2012-02-24 ENCOUNTER — Ambulatory Visit (HOSPITAL_BASED_OUTPATIENT_CLINIC_OR_DEPARTMENT_OTHER): Payer: Medicare Other | Admitting: Oncology

## 2012-02-24 ENCOUNTER — Telehealth: Payer: Self-pay | Admitting: Oncology

## 2012-02-24 VITALS — BP 138/81 | HR 69 | Temp 98.3°F | Ht 65.5 in | Wt 251.2 lb

## 2012-02-24 DIAGNOSIS — C50919 Malignant neoplasm of unspecified site of unspecified female breast: Secondary | ICD-10-CM

## 2012-02-24 LAB — CBC WITH DIFFERENTIAL/PLATELET
Eosinophils Absolute: 0.3 10*3/uL (ref 0.0–0.5)
HCT: 42.6 % (ref 34.8–46.6)
LYMPH%: 25.7 % (ref 14.0–49.7)
MONO#: 0.6 10*3/uL (ref 0.1–0.9)
NEUT#: 4.3 10*3/uL (ref 1.5–6.5)
NEUT%: 60.8 % (ref 38.4–76.8)
Platelets: 178 10*3/uL (ref 145–400)
WBC: 7.1 10*3/uL (ref 3.9–10.3)
lymph#: 1.8 10*3/uL (ref 0.9–3.3)

## 2012-02-24 LAB — COMPREHENSIVE METABOLIC PANEL
ALT: 36 U/L — ABNORMAL HIGH (ref 0–35)
AST: 36 U/L (ref 0–37)
Alkaline Phosphatase: 76 U/L (ref 39–117)
BUN: 18 mg/dL (ref 6–23)
Creatinine, Ser: 0.79 mg/dL (ref 0.50–1.10)
Total Bilirubin: 0.4 mg/dL (ref 0.3–1.2)

## 2012-02-24 MED ORDER — ANASTROZOLE 1 MG PO TABS: 1.0000 mg | ORAL_TABLET | Freq: Every day | ORAL | Status: DC

## 2012-02-24 NOTE — Telephone Encounter (Signed)
REMOVAL OF SPACERS IN BREAST, NEEDS SURGICAL CLEARANCE, PLS CALL PT [phone]

## 2012-02-24 NOTE — Telephone Encounter (Signed)
gve the pt her oct 2013 appt calendar along with the bone density appt at [facility]

## 2012-02-24 NOTE — Telephone Encounter (Signed)
Dr Herbert Moors is doing the surgery  Wanting to do the middle of August.  Call her on cell [phone] after talking with Dr Shirlee Latch (leaving Wed for 10 days at the beach) She is seeing MD on 03/10/12 but wants to get this scheduled now.  Her surgery was on 12/14/11.  She was told would like to wait 3 months after surgery to proceed with reconstruction.  Please advise

## 2012-02-24 NOTE — Progress Notes (Signed)
ID: Jamie [name]   DOB: 19-Oct-1945  MR#: [id number]  CSN#:[id number]  HISTORY OF PRESENT ILLNESS: The patient noted a mass in her left breast and brought it to the attention of her primary physician, Dr. Joetta [name], who set her up for diagnostic mammography. This showed a mass of about 3 cm in diameter (I do not have a copy of that study or comparison with prior mammography from July of 2012). The patient was referred to Dr. Carolynne [name] and he set her up for biopsy of the mass 08[facility]28/2011 [facility]. The pathology showed [id number]) an invasive ductal carcinoma, grade 2, estrogen receptor 100% positive, progesterone receptor 96% positive, with an MIB-1 of 30%, and HER-2 equivocal with the ratio by CISH of 1.81.  Diagnostic right mammography was then performed 08/28/2011. Multiple masses were noted, and there were multiple soft tissue nodules by palpation. Ultrasound specifically showed 2 hypoechoic round masses measuring 1.3 and 0.9 cm respectively. 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 With this information and after extensive discussion the patient opted for bilateral mastectomies with left sentinel lymph node sampling, and this was performed 10/18/2011, with results and further treatment as discussed below.  INTERVAL HISTORY: Jamie [name] returns today accompanied by her daughter, Jamie [name], and 2 of Jamie [name]'s children. Since her last visit here Jamie [name] decided not to receive postmastectomy radiation, because she felt "her body had had enough". She is planning to complete her reconstruction under Dr. Kelly [name] late August.  REVIEW OF SYSTEMS: Jamie [name] is tolerating the anastrozole without any side effects that she is aware of. Overall she "feels good". She occasionally has stabbing pains in the left chest wall area and she certainly has numbness there. Otherwise her breathing is much better, she has had some urinary tract infection issues which have  been resolved, but a detailed review of systems today was noncontributory.  PAST MEDICAL HISTORY: Past Medical History  Diagnosis Date  . Asthma   . COPD (chronic obstructive pulmonary disease)   . Sleep apnea     sleep study2-3 yrs ago lost weight afterward but gained back  . UTI (lower urinary tract infection)     freq-bladder implant -removed  . Headache     hx migraines  . Breast cancer 08/2011    s/p bilat mastectomies   . CAD (coronary artery disease)     NSTEMI 5/13:  LHC demonstrated oLM 75%, pLAD 80%, RCA occluded, filled by collaterals from the LAD.  Echo 12/10/11: EF 60%.; CABG 12/13/11: LIMA-LAD, SVG-RCA, SVG-diagonal and OM.   Marland Kitchen HLD (hyperlipidemia)   . DM2 (diabetes mellitus, type 2)   . Carotid stenosis     Pre-CABG Dopplers 5/13: Bilateral 40-59%  . Hypertension   . Myocardial infarction   . Obesity   hx tobacco abuse, 80 P/Y, resolved  PAST SURGICAL HISTORY: Past Surgical History  Procedure Date  . Cesarean section 04/1976  . Abdominal hysterectomy 1977  . Laparoscopic gastric banding 2010  . Nasal sinus surgery   . Tonsillectomy   . Appendectomy   . Mastectomy w/ sentinel node biopsy 10/18/2011    Procedure: MASTECTOMY WITH SENTINEL LYMPH NODE BIOPSY;  Surgeon: Jamie Askew, MD;  Location: [facility] OR;  Service: General;  Laterality: Bilateral;  bilateral mastectomy and left sentinel node biopsy  . Axillary lymph node dissection 10/18/2011    Procedure: AXILLARY LYMPH NODE DISSECTION;  Surgeon: Jamie Askew, MD;  Location: [facility] OR;  Service: General;  Laterality: Left;  . Breast reconstruction 10/18/2011    Procedure: BREAST RECONSTRUCTION;  Surgeon: Jamie Denis, DO;  Location: [facility] OR;  Service: Plastics;  Laterality: Bilateral;   bilateral breast reconstruction with bilateral tissue expander and placement of flex hd.  . US echocardiography     at [facility]  . Portacath placement 12/02/2011    Procedure: INSERTION PORT-A-CATH;  Surgeon: Jamie Askew, MD;   Location: [facility] OR;  Service: General;  Laterality: Right;  . Coronary artery bypass graft 12/13/2011    Procedure: CORONARY ARTERY BYPASS GRAFTING (CABG);  Surgeon: Alleen Borne, MD;  Location: [facility] OR;  Service: Open Heart Surgery;  Laterality: N/A;  Times four using endoscopically harvested left greater saphenous vein and left internal mammary artery. Right greater saphenous vein attempted; not appropriate for vein harvest.  no salpingo-oophorectomy  FAMILY HISTORY Family History  Problem Relation Age of Onset  . Anesthesia problems Neg Hx   . Hypotension Neg Hx   . Malignant hyperthermia Neg Hx   . Pseudochol deficiency Neg Hx   . Heart disease Mother   . Cancer Maternal Aunt     breast  . Cancer Paternal Aunt     ovarian  . Cancer Maternal Aunt     breast  The patient's father died at the age of 7. He had a history of asbestos exposure. The patient's mother died at the age of 55 from heart disease. The patient had one brother, no sisters. There is no breast or ovarian cancer in the immediate family but 2 of the patient's 5 maternal aunts had breast cancer (she does not know the age of diagnosis), and one of her father's sisters had ovarian cancer.  GYNECOLOGIC HISTORY: She had menarche age 20, no firm date for her menopause as she had hysterectomy remotely. She is GX P2, with a pair of twins and a daughter as her biological children as noted below. She never took hormone replacement.  SOCIAL HISTORY: She owns a tuxedo shop. Her husband of 34 years, Jamie [name], is retired but used to own a Designer, television/film set. He is currently working in IT with one of his own sons. At home in addition to the patient and her husband is one of the patient's daughters and her 2 children, as well as one of Forrest sons. Altogether they have 6 children and 15 grandchildren. The patient attends [employer]    ADVANCED DIRECTIVES:  HEALTH MAINTENANCE: History  Substance Use Topics  . Smoking status: Former  Smoker -- 2.0 packs/day    Types: Cigarettes    Quit date: 08/25/2006  . Smokeless tobacco: Never Used   Comment: social drinker 2-3 yr  . Alcohol Use: Yes     rarely     Colonoscopy: 2012  PAP: s/p hysterectomy  Bone density:  Lipid panel:  Allergies  Allergen Reactions  . Vicodin (Hydrocodone-Acetaminophen) Other (See Comments)    syncope  . Sulfa Antibiotics Other (See Comments)    Unknown; childhood allergy    Current Outpatient Prescriptions  Medication Sig Dispense Refill  . aspirin 325 MG EC tablet Take 325 mg by mouth daily.      Marland Kitchen atorvastatin (LIPITOR) 80 MG tablet Take 1 tablet (80 mg total) by mouth daily at 6 PM.  90 tablet  3  . escitalopram (LEXAPRO) 5 MG tablet Take 5 mg by mouth daily.      Marland Kitchen lidocaine-prilocaine (EMLA) cream Apply 1 application topically as needed. Port cath      .  lisinopril (PRINIVIL,ZESTRIL) 5 MG tablet Take 1 tablet (5 mg total) by mouth daily.  90 tablet  3  . loratadine (CLARITIN) 10 MG tablet Take 10 mg by mouth daily.      Marland Kitchen LORazepam (ATIVAN) 0.5 MG tablet Take 0.5 mg by mouth at bedtime as needed.      . metFORMIN (GLUCOPHAGE) 500 MG tablet Take 1 tablet (500 mg total) by mouth 2 (two) times daily with a meal.  60 tablet  1  . metoprolol (LOPRESSOR) 50 MG tablet Take 1 tablet (50 mg total) by mouth 2 (two) times daily.  180 tablet  3  . Nutritional Supplements (JUICE PLUS FIBRE PO) Take 6 each by mouth daily.      Marland Kitchen albuterol (PROVENTIL HFA;VENTOLIN HFA) 108 (90 BASE) MCG/ACT inhaler Inhale 2 puffs into the lungs every 6 (six) hours as needed. For wheezing      . anastrozole (ARIMIDEX) 1 MG tablet       . gabapentin (NEURONTIN) 300 MG capsule Take 1 capsule (300 mg total) by mouth 3 (three) times daily.  60 capsule  3  . oxyCODONE (OXY IR/ROXICODONE) 5 MG immediate release tablet       . prochlorperazine (COMPAZINE) 10 MG tablet Take 10 mg by mouth every 6 (six) hours. Nausea        OBJECTIVE: Middle-aged white woman who appears  slightly fatigued. Filed Vitals:   02/24/12 1343  BP: 138/81  Pulse: 69  Temp: 98.3 F (36.8 C)     Body mass index is 41.17 kg/(m^2).    ECOG FS: 2  Filed Weights   02/24/12 1343  Weight: 251 lb 3.2 oz (113.944 kg)   Physical Exam: HEENT:  Sclerae anicteric.  Oropharynx clear.     Nodes:  No cervical, supraclavicular, or axillary lymphadenopathy palpated.  Breast Exam:  Status post bilateral mastectomies with expander is in place. There is no evidence of local recurrence. Lungs:  Clear to auscultation bilaterally.  No crackles, rhonchi, or wheezes.   Heart:  Regular rate and rhythm.   Abdomen:  Soft, obese, nontender.  Positive bowel sounds.   Musculoskeletal:  No focal spinal tenderness to palpation.  Extremities: Minimal bilateral ankle edemaSkin:  Benign.   Neuro:  Nonfocal. Alert and oriented x3.    LAB RESULTS: Lab Results  Component Value Date   WBC 7.1 02/24/2012   NEUTROABS 4.3 02/24/2012   HGB 13.7 02/24/2012   HCT 42.6 02/24/2012   MCV 85.6 02/24/2012   PLT 178 02/24/2012      Chemistry      Component Value Date/Time   NA 141 01/20/2012 1022   K 4.2 01/20/2012 1022   CL 109 01/20/2012 1022   CO2 25 01/20/2012 1022   BUN 13 01/20/2012 1022   CREATININE 0.7 01/20/2012 1022      Component Value Date/Time   CALCIUM 9.3 01/20/2012 1022   ALKPHOS 68 01/20/2012 1022   AST 36 01/20/2012 1022   ALT 30 01/20/2012 1022   BILITOT 0.2* 01/20/2012 1022       Lab Results  Component Value Date   LABCA2 27 09/03/2011     STUDIES: No results found.   ASSESSMENT: 66 year-old [name] woman   (1)  s/p bilateral mastectomies and left axillary lymph node dissection 10/18/2011 for a left-sided pT2 pN1, stage IIB invasive ductal carcinoma, grade 2, estrogen receptor 100% and progesterone receptor 96% positive, with no HER-2 amplification (1.81, repeat 1.35), Mib-1 of 30%. The right breast showed no malignancy.  (  2) Plan was to treat in the adjuvant setting with 4 dose dense  cycles of doxorubicin/cyclophosphamide followed by 4 dose dense cycles of paclitaxel. Doxorubicin given by continuous infusion over 72 hours, with Neulasta given at the time of pump removal. Patient received a truncated cycle 1, the continuous infusion disconnected after proximally 6 hours when the patient was admitted to the hospital with unstable angina.  (3)  During hospitalization, CT angio negative for PE, echo showing well-preserved EF, enzymes/ECG showing a NSTEMI, s/p cath May 7 showing left main, LAD and RCA disease with a well-preserved ejection fraction, revascularization performed 12/13/2011 with an uneventful postoperative course  (4) the patient opted against further chemotherapy and also against post-mastectomy irradiation; she started anastrozole May 2013   PLAN:  Face is tolerating the anastrozole well. We're going to obtain a bone density before her October visit, but the plan at this point is to continue aromatase inhibitor therapy for a total of 5 years, then reassess. I will completely restage her 2 years from her definitive surgery. She knows to call for any problems that may develop before the next visit.   MAGRINAT,GUSTAV C    02/24/2012

## 2012-02-24 NOTE — Telephone Encounter (Signed)
Left message to call back  

## 2012-02-24 NOTE — Telephone Encounter (Signed)
If she is symptomatically stable: No chest pain, no significant exertional dyspnea, she can do the surgery but needs to continue her cardiac meds perioperatively.

## 2012-02-25 ENCOUNTER — Encounter: Payer: Self-pay | Admitting: *Deleted

## 2012-02-25 NOTE — Telephone Encounter (Signed)
Pt rtn call , pls call [phone]

## 2012-02-25 NOTE — Telephone Encounter (Signed)
Pt stated she is doing better now than she has for years, she is off all COPD meds. Informed her to continue cardiac meds preoperatively and that Dr Shirlee Latch gave cardiac clearance, I will send note/ letter to Dr Shella Spearing. Pt agreed to plan.

## 2012-02-28 ENCOUNTER — Ambulatory Visit: Payer: Medicare Other | Admitting: Cardiology

## 2012-03-03 ENCOUNTER — Encounter: Payer: Self-pay | Admitting: *Deleted

## 2012-03-10 ENCOUNTER — Encounter: Payer: Self-pay | Admitting: Cardiology

## 2012-03-10 ENCOUNTER — Encounter: Payer: Self-pay | Admitting: *Deleted

## 2012-03-10 ENCOUNTER — Ambulatory Visit (INDEPENDENT_AMBULATORY_CARE_PROVIDER_SITE_OTHER): Payer: Medicare Other | Admitting: Cardiology

## 2012-03-10 VITALS — BP 132/83 | HR 67 | Ht 65.0 in | Wt 251.8 lb

## 2012-03-10 DIAGNOSIS — I6529 Occlusion and stenosis of unspecified carotid artery: Secondary | ICD-10-CM

## 2012-03-10 DIAGNOSIS — I509 Heart failure, unspecified: Secondary | ICD-10-CM

## 2012-03-10 DIAGNOSIS — I2510 Atherosclerotic heart disease of native coronary artery without angina pectoris: Secondary | ICD-10-CM

## 2012-03-10 DIAGNOSIS — I25810 Atherosclerosis of coronary artery bypass graft(s) without angina pectoris: Secondary | ICD-10-CM

## 2012-03-10 DIAGNOSIS — E785 Hyperlipidemia, unspecified: Secondary | ICD-10-CM

## 2012-03-10 DIAGNOSIS — E669 Obesity, unspecified: Secondary | ICD-10-CM

## 2012-03-10 DIAGNOSIS — Z01818 Encounter for other preprocedural examination: Secondary | ICD-10-CM

## 2012-03-10 LAB — LIPID PANEL
HDL: 41.1 mg/dL (ref >39.00)
LDL Cholesterol: 49 mg/dL (ref 0–99)
Total CHOL/HDL Ratio: 3
VLDL: 36.4 mg/dL (ref 0.0–40.0)

## 2012-03-10 NOTE — Patient Instructions (Addendum)
Your physician recommends that you have  a FASTING lipid profile today.  Your physician recommends that you schedule a follow-up appointment in: 4 months with Dr Shirlee Latch.   Your physician has requested that you have a carotid duplex. This test is an ultrasound of the carotid arteries in your neck. It looks at blood flow through these arteries that supply the brain with blood. Allow one hour for this exam. There are no restrictions or special instructions. MAY 2014

## 2012-03-10 NOTE — Progress Notes (Signed)
Patient ID: Jamie [name], female   DOB: 06-23-1946, 66 y.o.   MRN: [id number] PCP:  Raynelle Jan., MD   66 yo with a history of recently diagnosed breast cancer, s/p bilateral mastectomies, DM2, sleep apnea, COPD, obesity, HL.  She was admitted in 5/13 with diastolic CHF in the setting of NSTEMI. LHC demonstrated oLM 75%, pLAD 80%, RCA occluded, filled by collaterals from the LAD.  Echo 12/10/11: EF 60%.  She was referred for bypass surgery.  Pre-CABG Dopplers: Bilateral 40-59%;  ABIs > 1 bilaterally and indicative of bilateral calcified vessels.  CABG 12/13/11: LIMA-LAD, SVG-RCA, SVG-diagonal and OM.  Postop course was fairly uneventful.  She remained in sinus rhythm.   Since last appointment, Jamie [name] has been stable.  She is not going to have any more chemotherapy or radiation.  She is on anastrozole.   Her sternotomy still has some minor soreness.  She has no other chest pain.  She is breathing much better than prior to CABG.  She reports no dyspnea when walking on flat ground or up a flight of steps.  She has been walking in a pool for exercise.  Weight is down 3 lbs since last appointment.    Jamie [name] needs to undergo removal of her port and breast reconstructive surgery.  This is scheduled for later this month.   Labs (7/13): K 4.4, creatinine 0.79, ALT 36, AST 31, HCT 43  PMH: 1. Asthma 2. Diet-controlled diabetes 3. OSA 4. Frequent UTIs 5. Migraines 6. Breast cancer: s/p bilateral mastectomies, now on Letrozole.  7. CAD: NSTEMI in 5/13 with severe 3VD.  CABG 5/13 with LIMA-LAD, SVG-RCA, SVG-D, and SVG-OM.   8.  Hyperlipidemia 9. Carotid stenosis: 5/13 dopplers with 40-59% bilateral ICAs. 10. Obesity: h/o lap banding.  11. Diastolic CHF: Echo (5/13) with EF 60%, technically difficult study.    Current Outpatient Prescriptions  Medication Sig Dispense Refill  . anastrozole (ARIMIDEX) 1 MG tablet Take 1 tablet (1 mg total) by mouth daily.  90 tablet  12  . aspirin 325 MG EC tablet  Take 325 mg by mouth daily.      Marland Kitchen atorvastatin (LIPITOR) 80 MG tablet Take 1 tablet (80 mg total) by mouth daily at 6 PM.  90 tablet  3  . lisinopril (PRINIVIL,ZESTRIL) 5 MG tablet Take 1 tablet (5 mg total) by mouth daily.  90 tablet  3  . loratadine (CLARITIN) 10 MG tablet Take 10 mg by mouth daily.      . metoprolol (LOPRESSOR) 50 MG tablet Take 1 tablet (50 mg total) by mouth 2 (two) times daily.  180 tablet  3  . Nutritional Supplements (JUICE PLUS FIBRE PO) Take 6 each by mouth daily.        Allergies: Allergies  Allergen Reactions  . Vicodin (Hydrocodone-Acetaminophen) Other (See Comments)    syncope  . Sulfa Antibiotics Other (See Comments)    Unknown; childhood allergy   SH: Nonsmoker.  Lives with husband in [place].   FH: CAD.    ROS:  Please see the history of present illness.   All other systems reviewed and negative.   PHYSICAL EXAM: VS:  BP 132/83  Pulse 67  Ht 5\' 5"  (1.651 m)  Wt 251 lb 12.8 oz (114.216 kg)  BMI 41.90 kg/m2 General: NAD, obese Neck: No JVD, no thyromegaly or thyroid nodule.  Lungs: Clear to auscultation bilaterally with normal respiratory effort. CV: Nondisplaced PMI.  Heart regular S1/S2, no S3/S4, no murmur.  No peripheral  edema.  No carotid bruit.  Normal pedal pulses.  Abdomen: Soft, nontender, no hepatosplenomegaly, no distention.  Neurologic: Alert and oriented x 3.  Psych: Normal affect. Extremities: No clubbing or cyanosis.

## 2012-03-11 DIAGNOSIS — I6529 Occlusion and stenosis of unspecified carotid artery: Secondary | ICD-10-CM | POA: Insufficient documentation

## 2012-03-11 DIAGNOSIS — Z01818 Encounter for other preprocedural examination: Secondary | ICD-10-CM | POA: Insufficient documentation

## 2012-03-11 NOTE — Assessment & Plan Note (Signed)
Stable s/p CABG.  Continue ASA, statin, ACEI, metoprolol.  No ischemic symptoms.

## 2012-03-11 NOTE — Assessment & Plan Note (Signed)
We talked at length today about diet/exercise strategies for weight loss.

## 2012-03-11 NOTE — Assessment & Plan Note (Signed)
Jamie [name] needs to undergo general anesthesia for breast reconstructive surgery.  She is stable and doing well post-CABG.  I think she can go forward with the surgery later this month (3 months after CABG).  She should continue her beta blocker peri-operatively.

## 2012-03-11 NOTE — Assessment & Plan Note (Signed)
H/o diastolic CHF.  She is doing well symptomatically and does not appear volume overloaded.

## 2012-03-11 NOTE — Assessment & Plan Note (Signed)
Need repeat carotid dopplers in 5/14.

## 2012-03-11 NOTE — Assessment & Plan Note (Signed)
I will check lipids today with goal LDL < 70.

## 2012-03-25 ENCOUNTER — Other Ambulatory Visit (INDEPENDENT_AMBULATORY_CARE_PROVIDER_SITE_OTHER): Payer: Self-pay | Admitting: General Surgery

## 2012-03-31 ENCOUNTER — Other Ambulatory Visit: Payer: Self-pay | Admitting: Plastic Surgery

## 2012-03-31 DIAGNOSIS — Z9010 Acquired absence of unspecified breast and nipple: Secondary | ICD-10-CM

## 2012-03-31 NOTE — H&P (Signed)
History and Physical  [name]   03/17/2012 10:15 AM Office Visit  MRN: [id number]  Department:  Plastic Surgery  Dept Phone: [phone]  Description: Female DOB: 05/27/1946  Provider: Laree Garron Sanger, DO    Diagnoses  -  Acquired absence of bilateral breasts and nipples   - Primary   V45.71     Reason for Visit  -  Breast Reconstruction     Vitals - Last Recorded     163/76  1.651 m (5' 5")  114.306 kg (252 lb)  41.93 kg/m2       Subjective:   Patient ID: Jamie [name] is a 66 y.o. female.  HPI The patient is a 66 yrs old wf here with for her preoperative history and physical for bilateral breast reconstruction with removal of expanders, capsulectomies and placement of implants.  She already had her first reconstruction with placement of expander and FlexHD after bilateral mastectomies. She was diagnosed with left breast invasive ductal cancer after a mass was detected while she was showering. She underwent mammograms and ultrasounds that showed the area to be about 3 cm in diameter in the 12 o'clock position of the left breast. The tumor is grade 2, estrogen/progesterone receptor 100% positive, with an MIB-1 of 30%, and HER-2 equivocal with the ratio by CISH of 1.81. A diagnostic right mammography done 08/28/11 showed multiple masses. Ultrasound specifically showed 2 hypoechoic round masses. She was a 42 DD. The decision for radiation is still pending. The skin flaps are looking very good. She was ready for expansion today. She does not want to wait any longer and would like to do the exchange. She has received cardiac clearance for surgery.  The following portions of the patient's history were reviewed and updated as appropriate: allergies, current medications, past family history, past medical history, past social history, past surgical history and problem list.  Review of Systems  Constitutional: Negative.   HENT: Negative.   Eyes: Negative.   Respiratory: Negative.     Cardiovascular: Negative.   Gastrointestinal: Negative.   Genitourinary: Negative.   Musculoskeletal: Negative.   Neurological: Negative.   Hematological: Negative.   Psychiatric/Behavioral: Negative.       Objective:    Physical Exam  Constitutional: She appears well-developed and well-nourished.  HENT:   Head: Normocephalic and atraumatic.  Eyes: EOM are normal. Pupils are equal, round, and reactive to light.  Neck: Normal range of motion.  Cardiovascular: Normal rate.   Pulmonary/Chest: No respiratory distress. She has no wheezes.  Abdominal: Soft.  Musculoskeletal: Normal range of motion.  Skin: Skin is warm.  Psychiatric: She has a normal mood and affect. Her behavior is normal. Judgment and thought content normal.   Assessment:  1.  Acquired absence of bilateral breasts and nipples     Plan:    Risks and complications were reviewed and include bleeding, pain, scar, risk of anesthesia and capsular contracture.  Consent was signed. We filled each expander 60cc for a total of 405 cc in each side.    Medications Ordered This Encounter      docusate sodium (COLACE) 100 MG capsule 20 Take 1 capsule (100 mg total) by mouth 3 times daily.    HYDROcodone-acetaminophen (NORCO) 5-325 mg per tablet 30 Take 1 tablet by mouth every 6 (six) hours as needed for 10 days for Pain.    cephalexin (KEFLEX) 500 MG capsule 28 Take 1 capsule (500 mg total) by mouth 4 times daily.     Discontinued   Medications     metFORMIN (GLUCOPHAGE) 500 MG tablet      LORazepam (ATIVAN) 0.5 MG tablet      metoprolol succinate (TOPROL-XL) 50 MG 24 hr tablet      metoprolol tartrate (LOPRESSOR) 50 MG tablet      SPIRIVA WITH HANDIHALER 18 mcg inhalation capsule          

## 2012-04-07 ENCOUNTER — Other Ambulatory Visit: Payer: Self-pay | Admitting: Physician Assistant

## 2012-04-07 ENCOUNTER — Encounter (HOSPITAL_BASED_OUTPATIENT_CLINIC_OR_DEPARTMENT_OTHER): Payer: Self-pay | Admitting: *Deleted

## 2012-04-07 NOTE — Progress Notes (Signed)
Pt states she was not told to stop ASA-to call dr Kelly Splinter Pt states her cardiologist told her she would probably stay overnight surgery since she had cabg 5/13 No bed requested-called dr Shirlee Latch office to clarify if he think she needs telemetry post op-we do not have telemetry here. The answer was it was up to dr Kelly Splinter. Talked to dr Kelly Splinter nurse she will follow up with that.

## 2012-04-08 NOTE — Progress Notes (Signed)
Dr Gelene Mink spoke with Dr Kelly Splinter pt will done here but will stay overnight in [facility].

## 2012-04-09 ENCOUNTER — Encounter (HOSPITAL_BASED_OUTPATIENT_CLINIC_OR_DEPARTMENT_OTHER)
Admission: RE | Disposition: A | Discharge status: Home / Self Care | Payer: Self-pay | Source: Ambulatory Visit | Attending: Plastic Surgery

## 2012-04-09 ENCOUNTER — Ambulatory Visit (HOSPITAL_BASED_OUTPATIENT_CLINIC_OR_DEPARTMENT_OTHER)
Admission: RE | Admit: 2012-04-09 | Discharge: 2012-04-10 | Disposition: A | Discharge status: Home / Self Care | Payer: Medicare Other | Source: Ambulatory Visit | Attending: Plastic Surgery | Admitting: Plastic Surgery

## 2012-04-09 ENCOUNTER — Ambulatory Visit (HOSPITAL_BASED_OUTPATIENT_CLINIC_OR_DEPARTMENT_OTHER): Payer: Medicare Other | Admitting: Anesthesiology

## 2012-04-09 ENCOUNTER — Encounter (HOSPITAL_BASED_OUTPATIENT_CLINIC_OR_DEPARTMENT_OTHER): Payer: Self-pay | Admitting: Plastic Surgery

## 2012-04-09 ENCOUNTER — Encounter (HOSPITAL_BASED_OUTPATIENT_CLINIC_OR_DEPARTMENT_OTHER): Payer: Self-pay | Admitting: Anesthesiology

## 2012-04-09 ENCOUNTER — Encounter (HOSPITAL_BASED_OUTPATIENT_CLINIC_OR_DEPARTMENT_OTHER): Payer: Self-pay | Admitting: *Deleted

## 2012-04-09 DIAGNOSIS — Z452 Encounter for adjustment and management of vascular access device: Secondary | ICD-10-CM | POA: Insufficient documentation

## 2012-04-09 DIAGNOSIS — Z9010 Acquired absence of unspecified breast and nipple: Secondary | ICD-10-CM | POA: Insufficient documentation

## 2012-04-09 DIAGNOSIS — I2510 Atherosclerotic heart disease of native coronary artery without angina pectoris: Secondary | ICD-10-CM

## 2012-04-09 DIAGNOSIS — Z421 Encounter for breast reconstruction following mastectomy: Secondary | ICD-10-CM | POA: Insufficient documentation

## 2012-04-09 DIAGNOSIS — E7800 Pure hypercholesterolemia, unspecified: Secondary | ICD-10-CM

## 2012-04-09 DIAGNOSIS — E119 Type 2 diabetes mellitus without complications: Secondary | ICD-10-CM | POA: Insufficient documentation

## 2012-04-09 DIAGNOSIS — J449 Chronic obstructive pulmonary disease, unspecified: Secondary | ICD-10-CM | POA: Insufficient documentation

## 2012-04-09 DIAGNOSIS — Z853 Personal history of malignant neoplasm of breast: Secondary | ICD-10-CM | POA: Insufficient documentation

## 2012-04-09 DIAGNOSIS — Z9013 Acquired absence of bilateral breasts and nipples: Secondary | ICD-10-CM

## 2012-04-09 DIAGNOSIS — L723 Sebaceous cyst: Secondary | ICD-10-CM | POA: Insufficient documentation

## 2012-04-09 DIAGNOSIS — J4489 Other specified chronic obstructive pulmonary disease: Secondary | ICD-10-CM | POA: Insufficient documentation

## 2012-04-09 DIAGNOSIS — I10 Essential (primary) hypertension: Secondary | ICD-10-CM | POA: Insufficient documentation

## 2012-04-09 DIAGNOSIS — C50919 Malignant neoplasm of unspecified site of unspecified female breast: Secondary | ICD-10-CM

## 2012-04-09 HISTORY — PX: PORT-A-CATH REMOVAL: SHX5289

## 2012-04-09 HISTORY — PX: LESION REMOVAL: SHX5196

## 2012-04-09 LAB — POCT I-STAT, CHEM 8
Creatinine, Ser: 0.9 mg/dL (ref 0.50–1.10)
HCT: 44 % (ref 36.0–46.0)
Hemoglobin: 15 g/dL (ref 12.0–15.0)
Potassium: 4.3 mEq/L (ref 3.5–5.1)
Sodium: 141 mEq/L (ref 135–145)
TCO2: 24 mmol/L (ref 0–100)

## 2012-04-09 SURGERY — CAPSULECTOMY, BREAST, WITH REPLACEMENT OF IMPLANT
Anesthesia: General | Site: Face | Laterality: Right | Wound class: Clean

## 2012-04-09 MED ORDER — LACTATED RINGERS IV SOLN
INTRAVENOUS | Status: DC
  Administered 2012-04-09 (×2): via INTRAVENOUS

## 2012-04-09 MED ORDER — CEFAZOLIN SODIUM-DEXTROSE 2-3 GM-% IV SOLR: 2.0000 g | INTRAVENOUS | Status: DC

## 2012-04-09 MED ORDER — MIDAZOLAM HCL 5 MG/5ML IJ SOLN
INTRAMUSCULAR | Status: DC | PRN
  Administered 2012-04-09: 2 mg via INTRAVENOUS

## 2012-04-09 MED ORDER — METOCLOPRAMIDE HCL 5 MG/ML IJ SOLN: 10.0000 mg | Freq: Once | INTRAMUSCULAR | Status: AC | PRN

## 2012-04-09 MED ORDER — OXYCODONE HCL 5 MG PO TABS
5.0000 mg | ORAL_TABLET | Freq: Once | ORAL | Status: AC | PRN
  Administered 2012-04-09: 5 mg via ORAL

## 2012-04-09 MED ORDER — ATORVASTATIN CALCIUM 80 MG PO TABS: 80.0000 mg | ORAL_TABLET | Freq: Every day | ORAL | Status: DC

## 2012-04-09 MED ORDER — LISINOPRIL 5 MG PO TABS: 5.0000 mg | ORAL_TABLET | Freq: Every day | ORAL | Status: DC

## 2012-04-09 MED ORDER — DEXAMETHASONE SODIUM PHOSPHATE 4 MG/ML IJ SOLN
INTRAMUSCULAR | Status: DC | PRN
  Administered 2012-04-09: 5 mg via INTRAVENOUS

## 2012-04-09 MED ORDER — HYDROMORPHONE HCL PF 1 MG/ML IJ SOLN: 0.2500 mg | INTRAMUSCULAR | Status: DC | PRN

## 2012-04-09 MED ORDER — LORATADINE 10 MG PO TABS: 10.0000 mg | ORAL_TABLET | Freq: Every day | ORAL | Status: DC

## 2012-04-09 MED ORDER — LIDOCAINE HCL (CARDIAC) 20 MG/ML IV SOLN
INTRAVENOUS | Status: DC | PRN
  Administered 2012-04-09: 50 mg via INTRAVENOUS

## 2012-04-09 MED ORDER — MORPHINE SULFATE 2 MG/ML IJ SOLN
2.0000 mg | INTRAMUSCULAR | Status: DC | PRN
  Administered 2012-04-09: 2 mg via INTRAVENOUS

## 2012-04-09 MED ORDER — SODIUM CHLORIDE 0.9 % IR SOLN
Status: DC | PRN
  Administered 2012-04-09: 13:00:00

## 2012-04-09 MED ORDER — SODIUM CHLORIDE 0.45 % IV SOLN: INTRAVENOUS | Status: DC

## 2012-04-09 MED ORDER — FENTANYL CITRATE 0.05 MG/ML IJ SOLN
INTRAMUSCULAR | Status: DC | PRN
  Administered 2012-04-09 (×2): 25 ug via INTRAVENOUS
  Administered 2012-04-09: 50 ug via INTRAVENOUS
  Administered 2012-04-09 (×2): 25 ug via INTRAVENOUS
  Administered 2012-04-09: 50 ug via INTRAVENOUS

## 2012-04-09 MED ORDER — CEFAZOLIN SODIUM-DEXTROSE 2-3 GM-% IV SOLR
2.0000 g | INTRAVENOUS | Status: AC
  Administered 2012-04-09: 2 g via INTRAVENOUS

## 2012-04-09 MED ORDER — SODIUM CHLORIDE 0.9 % IR SOLN
Status: DC | PRN
  Administered 2012-04-09: 14:00:00

## 2012-04-09 MED ORDER — ASPIRIN EC 325 MG PO TBEC: 325.0000 mg | DELAYED_RELEASE_TABLET | Freq: Every day | ORAL | Status: DC

## 2012-04-09 MED ORDER — SCOPOLAMINE 1 MG/3DAYS TD PT72
1.0000 | MEDICATED_PATCH | Freq: Once | TRANSDERMAL | Status: DC
  Administered 2012-04-09: 1.5 mg via TRANSDERMAL

## 2012-04-09 MED ORDER — DARIFENACIN HYDROBROMIDE ER 7.5 MG PO TB24: 7.5000 mg | ORAL_TABLET | Freq: Every day | ORAL | Status: DC

## 2012-04-09 MED ORDER — ACETAMINOPHEN 10 MG/ML IV SOLN
1000.0000 mg | Freq: Once | INTRAVENOUS | Status: AC
  Administered 2012-04-09: 1000 mg via INTRAVENOUS

## 2012-04-09 MED ORDER — CEFAZOLIN SODIUM 1-5 GM-% IV SOLN
1.0000 g | Freq: Three times a day (TID) | INTRAVENOUS | Status: DC
  Administered 2012-04-09 – 2012-04-10 (×2): 1 g via INTRAVENOUS

## 2012-04-09 MED ORDER — ONDANSETRON HCL 4 MG/2ML IJ SOLN: 4.0000 mg | Freq: Four times a day (QID) | INTRAMUSCULAR | Status: DC | PRN

## 2012-04-09 MED ORDER — ONDANSETRON HCL 4 MG/2ML IJ SOLN
INTRAMUSCULAR | Status: DC | PRN
  Administered 2012-04-09: 4 mg via INTRAVENOUS

## 2012-04-09 MED ORDER — DIPHENHYDRAMINE HCL 12.5 MG/5ML PO ELIX: 12.5000 mg | ORAL_SOLUTION | Freq: Four times a day (QID) | ORAL | Status: DC | PRN

## 2012-04-09 MED ORDER — CHLORHEXIDINE GLUCONATE 4 % EX LIQD: 1.0000 "application " | Freq: Once | CUTANEOUS | Status: DC

## 2012-04-09 MED ORDER — OXYCODONE HCL 5 MG/5ML PO SOLN: 5.0000 mg | Freq: Once | ORAL | Status: AC | PRN

## 2012-04-09 MED ORDER — DOCUSATE SODIUM 100 MG PO CAPS: 100.0000 mg | ORAL_CAPSULE | Freq: Three times a day (TID) | ORAL | Status: DC

## 2012-04-09 MED ORDER — POTASSIUM CHLORIDE IN NACL 20-0.45 MEQ/L-% IV SOLN
INTRAVENOUS | Status: DC
  Administered 2012-04-09: 15:00:00 via INTRAVENOUS

## 2012-04-09 MED ORDER — HYDROCODONE-ACETAMINOPHEN 5-325 MG PO TABS: 1.0000 | ORAL_TABLET | ORAL | Status: DC | PRN

## 2012-04-09 MED ORDER — ANASTROZOLE 1 MG PO TABS: 1.0000 mg | ORAL_TABLET | Freq: Every day | ORAL | Status: DC

## 2012-04-09 MED ORDER — LIDOCAINE-EPINEPHRINE (PF) 1 %-1:200000 IJ SOLN
INTRAMUSCULAR | Status: DC | PRN
  Administered 2012-04-09: 2 mL

## 2012-04-09 MED ORDER — PROPOFOL 10 MG/ML IV BOLUS
INTRAVENOUS | Status: DC | PRN
  Administered 2012-04-09: 200 mg via INTRAVENOUS

## 2012-04-09 MED ORDER — METOPROLOL TARTRATE 50 MG PO TABS: 50.0000 mg | ORAL_TABLET | Freq: Two times a day (BID) | ORAL | Status: DC

## 2012-04-09 MED ORDER — OXYCODONE-ACETAMINOPHEN 5-325 MG PO TABS
1.0000 | ORAL_TABLET | ORAL | Status: DC | PRN
Start: 2012-04-09 — End: 2012-04-10
  Administered 2012-04-09 – 2012-04-10 (×2): 1 via ORAL

## 2012-04-09 SURGICAL SUPPLY — 71 items
ADH SKN CLS APL DERMABOND .7 (GAUZE/BANDAGES/DRESSINGS) ×2
BAG DECANTER FOR FLEXI CONT (MISCELLANEOUS) ×8 IMPLANT
BANDAGE GAUZE ELAST BULKY 4 IN (GAUZE/BANDAGES/DRESSINGS) ×8 IMPLANT
BINDER BREAST LRG (GAUZE/BANDAGES/DRESSINGS) IMPLANT
BINDER BREAST MEDIUM (GAUZE/BANDAGES/DRESSINGS) IMPLANT
BINDER BREAST XLRG (GAUZE/BANDAGES/DRESSINGS) ×4 IMPLANT
BINDER BREAST XXLRG (GAUZE/BANDAGES/DRESSINGS) IMPLANT
BLADE HEX COATED 2.75 (ELECTRODE) ×4 IMPLANT
BLADE SURG 15 STRL LF DISP TIS (BLADE) ×6 IMPLANT
BLADE SURG 15 STRL SS (BLADE) ×6
CANISTER SUCTION 1200CC (MISCELLANEOUS) ×4 IMPLANT
CHLORAPREP W/TINT 26ML (MISCELLANEOUS) ×4 IMPLANT
CLOTH BEACON ORANGE TIMEOUT ST (SAFETY) ×8 IMPLANT
CORDS BIPOLAR (ELECTRODE) IMPLANT
COVER MAYO STAND STRL (DRAPES) ×8 IMPLANT
COVER TABLE BACK 60X90 (DRAPES) ×4 IMPLANT
DECANTER SPIKE VIAL GLASS SM (MISCELLANEOUS) ×4 IMPLANT
DERMABOND ADVANCED (GAUZE/BANDAGES/DRESSINGS) ×2
DERMABOND ADVANCED .7 DNX12 (GAUZE/BANDAGES/DRESSINGS) ×6 IMPLANT
DRAIN CHANNEL 19F RND (DRAIN) IMPLANT
DRAPE LAPAROSCOPIC ABDOMINAL (DRAPES) ×4 IMPLANT
DRAPE PED LAPAROTOMY (DRAPES) ×4 IMPLANT
DRAPE UTILITY XL STRL (DRAPES) ×4 IMPLANT
DRSG PAD ABDOMINAL 8X10 ST (GAUZE/BANDAGES/DRESSINGS) ×4 IMPLANT
ELECT BLADE 4.0 EZ CLEAN MEGAD (MISCELLANEOUS) ×4
ELECT COATED BLADE 2.86 ST (ELECTRODE) ×4 IMPLANT
ELECT REM PT RETURN 9FT ADLT (ELECTROSURGICAL) ×4
ELECTRODE BLDE 4.0 EZ CLN MEGD (MISCELLANEOUS) ×3 IMPLANT
ELECTRODE REM PT RTRN 9FT ADLT (ELECTROSURGICAL) ×3 IMPLANT
EVACUATOR SILICONE 100CC (DRAIN) IMPLANT
GAUZE SPONGE 4X4 12PLY STRL LF (GAUZE/BANDAGES/DRESSINGS) IMPLANT
GLOVE BIO SURGEON STRL SZ 6.5 (GLOVE) ×16 IMPLANT
GLOVE BIO SURGEON STRL SZ7.5 (GLOVE) ×4 IMPLANT
GLOVE SKINSENSE NS SZ6.5 (GLOVE)
GLOVE SKINSENSE STRL SZ6.5 (GLOVE) IMPLANT
GOWN PREVENTION PLUS XLARGE (GOWN DISPOSABLE) ×16 IMPLANT
IMPL GEL 450CC (Breast) ×6 IMPLANT
IMPLANT GEL 450CC (Breast) ×8 IMPLANT
IV NS 1000ML (IV SOLUTION)
IV NS 1000ML BAXH (IV SOLUTION) IMPLANT
IV NS 500ML (IV SOLUTION) ×4
IV NS 500ML BAXH (IV SOLUTION) ×3 IMPLANT
KIT FILL MCGHAN 30CC (MISCELLANEOUS) IMPLANT
MARKER SKIN DUAL TIP RULER LAB (MISCELLANEOUS) ×4 IMPLANT
NEEDLE 27GAX1X1/2 (NEEDLE) ×4 IMPLANT
NEEDLE HYPO 25X1 1.5 SAFETY (NEEDLE) ×4 IMPLANT
NEEDLE HYPO 30GX1 BEV (NEEDLE) ×4 IMPLANT
PACK BASIN DAY SURGERY FS (CUSTOM PROCEDURE TRAY) ×4 IMPLANT
PENCIL BUTTON HOLSTER BLD 10FT (ELECTRODE) ×8 IMPLANT
PIN SAFETY STERILE (MISCELLANEOUS) IMPLANT
SLEEVE SCD COMPRESS KNEE MED (MISCELLANEOUS) ×4 IMPLANT
SPONGE LAP 18X18 X RAY DECT (DISPOSABLE) ×8 IMPLANT
STRIP CLOSURE SKIN 1/2X4 (GAUZE/BANDAGES/DRESSINGS) ×4 IMPLANT
SUCTION FRAZIER TIP 10 FR DISP (SUCTIONS) ×4 IMPLANT
SUT MNCRL 6-0 UNDY P1 1X18 (SUTURE) ×3 IMPLANT
SUT MON AB 4-0 PC3 18 (SUTURE) ×4 IMPLANT
SUT MON AB 5-0 PS2 18 (SUTURE) ×8 IMPLANT
SUT MONOCRYL 6-0 P1 1X18 (SUTURE) ×1
SUT PDS AB 2-0 CT2 27 (SUTURE) IMPLANT
SUT VIC AB 3-0 SH 27 (SUTURE) ×12
SUT VIC AB 3-0 SH 27X BRD (SUTURE) ×9 IMPLANT
SUT VICRYL 4-0 PS2 18IN ABS (SUTURE) ×8 IMPLANT
SYR 50ML LL SCALE MARK (SYRINGE) IMPLANT
SYR BULB IRRIGATION 50ML (SYRINGE) ×4 IMPLANT
SYR CONTROL 10ML LL (SYRINGE) ×4 IMPLANT
TOWEL OR 17X24 6PK STRL BLUE (TOWEL DISPOSABLE) ×8 IMPLANT
TOWEL OR NON WOVEN STRL DISP B (DISPOSABLE) ×4 IMPLANT
TUBE CONNECTING 20X1/4 (TUBING) ×4 IMPLANT
UNDERPAD 30X30 INCONTINENT (UNDERPADS AND DIAPERS) ×8 IMPLANT
WATER STERILE IRR 1000ML POUR (IV SOLUTION) ×4 IMPLANT
YANKAUER SUCT BULB TIP NO VENT (SUCTIONS) ×4 IMPLANT

## 2012-04-09 NOTE — Anesthesia Procedure Notes (Addendum)
Procedure Name: LMA Insertion Date/Time: 04/09/2012 12:42 PM Performed by: Zenia Resides D Pre-anesthesia Checklist: Patient identified, Emergency Drugs available, Suction available and Patient being monitored Patient Re-evaluated:Patient Re-evaluated prior to inductionOxygen Delivery Method: Circle System Utilized Preoxygenation: Pre-oxygenation with 100% oxygen Intubation Type: IV induction Ventilation: Mask ventilation without difficulty LMA: LMA with gastric port inserted LMA Size: 4.0 Number of attempts: 1 Placement Confirmation: positive ETCO2 Tube secured with: Tape Dental Injury: Teeth and Oropharynx as per pre-operative assessment

## 2012-04-09 NOTE — Anesthesia Preprocedure Evaluation (Signed)
Anesthesia Evaluation  Patient identified by MRN, date of birth, ID band Patient awake    Reviewed: Allergy & Precautions, H&P , NPO status , Patient's Chart, lab work & pertinent test results, reviewed documented beta blocker date and time   Airway Mallampati: II TM Distance: >3 FB Neck ROM: full    Dental   Pulmonary asthma , sleep apnea , COPD breath sounds clear to auscultation        Cardiovascular hypertension, Pt. on medications and Pt. on home beta blockers + CAD, + Past MI, + CABG and +CHF Rhythm:regular     Neuro/Psych negative neurological ROS  negative psych ROS   GI/Hepatic negative GI ROS, Neg liver ROS,   Endo/Other  diabetes, Oral Hypoglycemic Agents  Renal/GU negative Renal ROS  negative genitourinary   Musculoskeletal   Abdominal   Peds  Hematology negative hematology ROS (+)   Anesthesia Other Findings See surgeon's H&P   Reproductive/Obstetrics negative OB ROS                           Anesthesia Physical Anesthesia Plan  ASA: III  Anesthesia Plan: General   Post-op Pain Management:    Induction: Intravenous  Airway Management Planned: LMA  Additional Equipment:   Intra-op Plan:   Post-operative Plan: Extubation in OR  Informed Consent: I have reviewed the patients History and Physical, chart, labs and discussed the procedure including the risks, benefits and alternatives for the proposed anesthesia with the patient or authorized representative who has indicated his/her understanding and acceptance.   Dental Advisory Given  Plan Discussed with: CRNA and Surgeon  Anesthesia Plan Comments:         Anesthesia Quick Evaluation

## 2012-04-09 NOTE — Transfer of Care (Signed)
Immediate Anesthesia Transfer of Care Note  Patient: Jamie [name]  Procedure(s) Performed: Procedure(s) (LRB) with comments: BREAST CAPSULECTOMY WITH IMPLANT EXCHANGE (Bilateral) - removal of bilateral breast tissue expanders capsulectomy and placement of implants REMOVAL PORT-A-CATH (Right) LESION REMOVAL (Bilateral)  Patient Location: PACU  Anesthesia Type: General  Level of Consciousness: awake, alert  and oriented  Airway & Oxygen Therapy: Patient Spontanous Breathing and Patient connected to face mask oxygen  Post-op Assessment: Report given to PACU RN and Post -op Vital signs reviewed and stable  Post vital signs: Reviewed and stable  Complications: No apparent anesthesia complications

## 2012-04-09 NOTE — Interval H&P Note (Signed)
History and Physical Interval Note:  04/09/2012 12:18 PM  Jamie [name]  has presented today for surgery, with the diagnosis of breast cancer  The various methods of treatment have been discussed with the patient and family. After consideration of risks, benefits and other options for treatment, the patient has consented to  Procedure(s) (LRB) with comments: BREAST CAPSULECTOMY WITH IMPLANT EXCHANGE (Bilateral) - removal of bilateral breast tissue expanders capsulectomy and placement of implants REMOVAL PORT-A-CATH (N/A) as a surgical intervention .  The patient's history has been reviewed, patient examined, no change in status, stable for surgery.  I have reviewed the patient's chart and labs.  Questions were answered to the patient's satisfaction.     SANGER,Amayra Kiedrowski

## 2012-04-09 NOTE — H&P (View-Only) (Signed)
History and Physical  Jamie [name]   03/17/2012 10:15 AM Office Visit  MRN: [id number]  Department:  Plastic Surgery  Dept Phone: [phone]  Description: Female DOB: 05-05-1946  Provider: Wayland Denis, DO    Diagnoses  -  Acquired absence of bilateral breasts and nipples   - Primary   V45.71     Reason for Visit  -  Breast Reconstruction     Vitals - Last Recorded     163/76  1.651 m (5\' 5" )  114.306 kg (252 lb)  41.93 kg/m2       Subjective:   Patient ID: Jamie [name] is a 66 y.o. female.  HPI The patient is a 66 yrs old wf here with for her preoperative history and physical for bilateral breast reconstruction with removal of expanders, capsulectomies and placement of implants.  She already had her first reconstruction with placement of expander and FlexHD after bilateral mastectomies. She was diagnosed with left breast invasive ductal cancer after a mass was detected while she was showering. She underwent mammograms and ultrasounds that showed the area to be about 3 cm in diameter in the 12 o'clock position of the left breast. The tumor is grade 2, estrogen/progesterone receptor 100% positive, with an MIB-1 of 30%, and HER-2 equivocal with the ratio by CISH of 1.81. A diagnostic right mammography done 08/28/11 showed multiple masses. Ultrasound specifically showed 2 hypoechoic round masses. She was a 42 DD. The decision for radiation is still pending. The skin flaps are looking very good. She was ready for expansion today. She does not want to wait any longer and would like to do the exchange. She has received cardiac clearance for surgery.  The following portions of the patient's history were reviewed and updated as appropriate: allergies, current medications, past family history, past medical history, past social history, past surgical history and problem list.  Review of Systems  Constitutional: Negative.   HENT: Negative.   Eyes: Negative.   Respiratory: Negative.     Cardiovascular: Negative.   Gastrointestinal: Negative.   Genitourinary: Negative.   Musculoskeletal: Negative.   Neurological: Negative.   Hematological: Negative.   Psychiatric/Behavioral: Negative.       Objective:    Physical Exam  Constitutional: She appears well-developed and well-nourished.  HENT:   Head: Normocephalic and atraumatic.  Eyes: EOM are normal. Pupils are equal, round, and reactive to light.  Neck: Normal range of motion.  Cardiovascular: Normal rate.   Pulmonary/Chest: No respiratory distress. She has no wheezes.  Abdominal: Soft.  Musculoskeletal: Normal range of motion.  Skin: Skin is warm.  Psychiatric: She has a normal mood and affect. Her behavior is normal. Judgment and thought content normal.   Assessment:  1.  Acquired absence of bilateral breasts and nipples     Plan:    Risks and complications were reviewed and include bleeding, pain, scar, risk of anesthesia and capsular contracture.  Consent was signed. We filled each expander 60cc for a total of 405 cc in each side.    Medications Ordered This Encounter      docusate sodium (COLACE) 100 MG capsule 20 Take 1 capsule (100 mg total) by mouth 3 times daily.    HYDROcodone-acetaminophen (NORCO) 5-325 mg per tablet 30 Take 1 tablet by mouth every 6 (six) hours as needed for 10 days for Pain.    cephalexin (KEFLEX) 500 MG capsule 28 Take 1 capsule (500 mg total) by mouth 4 times daily.     Discontinued  Medications     metFORMIN (GLUCOPHAGE) 500 MG tablet      LORazepam (ATIVAN) 0.5 MG tablet      metoprolol succinate (TOPROL-XL) 50 MG 24 hr tablet      metoprolol tartrate (LOPRESSOR) 50 MG tablet      SPIRIVA WITH HANDIHALER 18 mcg inhalation capsule

## 2012-04-09 NOTE — Brief Op Note (Signed)
04/09/2012  2:14 PM  PATIENT:  Dereck Leep  66 y.o. female  PRE-OPERATIVE DIAGNOSIS:  breast cancer  POST-OPERATIVE DIAGNOSIS:  breast cancer  PROCEDURE:  Procedure(s) (LRB) with comments: BREAST CAPSULECTOMY WITH IMPLANT EXCHANGE (Bilateral) - removal of bilateral breast tissue expanders capsulectomy and placement of implants REMOVAL PORT-A-CATH (Right) LESION REMOVAL (Bilateral) cheeks  SURGEON:  Surgeon(s) and Role: Panel 1:    * Dung Salinger Sanger, DO - Primary  Panel 2:    * Robyne Askew, MD - Primary  PHYSICIAN ASSISTANT: Shawn Rayburn, PA  ASSISTANTS: none   ANESTHESIA:   general  EBL:  Total I/O In: 1400 [I.V.:1400] Out: -   BLOOD ADMINISTERED:none  DRAINS: none   LOCAL MEDICATIONS USED:  NONE  SPECIMEN:  Source of Specimen:  bilateral mastectomy scars  DISPOSITION OF SPECIMEN:  PATHOLOGY  COUNTS:  YES  TOURNIQUET:  * No tourniquets in log *  DICTATION: dictated  PLAN OF CARE: Admit for overnight observation  PATIENT DISPOSITION:  PACU - hemodynamically stable.   Delay start of Pharmacological VTE agent (>24hrs) due to surgical blood loss or risk of bleeding: no

## 2012-04-09 NOTE — Op Note (Signed)
04/09/2012  1:52 PM  PATIENT:  Jamie [name]  66 y.o. female  PRE-OPERATIVE DIAGNOSIS:  breast cancer  POST-OPERATIVE DIAGNOSIS:  breast cancer  PROCEDURE:  Remove port SURGEON:  Surgeon(s) and Role:     * Robyne Askew, MD - Primary  PHYSICIAN ASSISTANT:   ASSISTANTS: none   ANESTHESIA:   general  EBL:  Total I/O In: 1000 [I.V.:1000] Out: -   BLOOD ADMINISTERED:none  DRAINS: none   LOCAL MEDICATIONS USED:  NONE  SPECIMEN:  No Specimen  DISPOSITION OF SPECIMEN:  N/A  COUNTS:  YES  TOURNIQUET:  * No tourniquets in log *  DICTATION: .Dragon Dictation After formal consent was obtained patient was brought to the operating room placed in the supine position on the operating table. After adequate induction of general anesthesia the patient's bilateral breasts and chest area were prepped with ChloraPrep, allowed to dry, and draped in usual sterile manner. While Dr. Kelly Splinter was performing her portion of the operation, I made a small incision on her right chest wall through her old Port-A-Cath incision with a 15 blade knife. The incision was carried through the skin and subcutaneous tissue sharply with the 15 blade knife until the port was identified. The capsule the port was opened sharply with a knife. The 2 anchoring stitches were breast with hemostats divided and removed. The port was then pushed out of its pocket. With gentle traction the port was removed without difficulty. Pressure was held for several minutes until the area was completely hemostatic. The deep layer of the wound was then closed with interrupted 3-0 Vicryl stitches. The skin was then closed with interrupted 4-0 Monocryl subcuticular stitches. Dermabond dressings were applied. The patient tolerated the procedure well. Admitting of the case needle sponge and instrument counts were correct. The patient was then awakened and taken to recovery in stable condition once Dr. Kelly Splinter was finished with her portion of the  case.  PLAN OF CARE: Discharge to home after PACU  PATIENT DISPOSITION:  PACU - hemodynamically stable.   Delay start of Pharmacological VTE agent (>24hrs) due to surgical blood loss or risk of bleeding: not applicable

## 2012-04-09 NOTE — Anesthesia Postprocedure Evaluation (Signed)
Anesthesia Post Note  Patient: Jamie [name]  Procedure(s) Performed: Procedure(s) (LRB): BREAST CAPSULECTOMY WITH IMPLANT EXCHANGE (Bilateral) REMOVAL PORT-A-CATH (Right) LESION REMOVAL (Bilateral)  Anesthesia type: General  Patient location: PACU  Post pain: Pain level controlled  Post assessment: Patient's Cardiovascular Status Stable  Last Vitals:  Filed Vitals:   04/09/12 1500  BP: 184/78  Pulse: 90  Temp:   Resp: 15    Post vital signs: Reviewed and stable  Level of consciousness: alert  Complications: No apparent anesthesia complications

## 2012-04-10 ENCOUNTER — Encounter (HOSPITAL_BASED_OUTPATIENT_CLINIC_OR_DEPARTMENT_OTHER): Payer: Self-pay | Admitting: Plastic Surgery

## 2012-04-10 ENCOUNTER — Telehealth (INDEPENDENT_AMBULATORY_CARE_PROVIDER_SITE_OTHER): Payer: Self-pay

## 2012-04-10 DIAGNOSIS — Z9013 Acquired absence of bilateral breasts and nipples: Secondary | ICD-10-CM

## 2012-04-10 MED ORDER — DSS 100 MG PO CAPS: 100.0000 mg | ORAL_CAPSULE | Freq: Three times a day (TID) | ORAL | Status: AC

## 2012-04-10 NOTE — Op Note (Signed)
Jamie [name], Jamie [name] NO.:  [id number]  MEDICAL RECORD NO.:  [id number]  LOCATION:[facility]  [facility]  PHYSICIAN:  Wayland Denis, DO      DATE OF BIRTH:  [birth year]  DATE OF PROCEDURE:  04/09/2012 DATE OF DISCHARGE:                              OPERATIVE REPORT   PREOPERATIVE DIAGNOSES: 1. Acquired bilateral breast absence secondary to mastectomies for     treatment of breast cancer. 2. Changing lesions of cheek, bilateral.  POSTOPERATIVE DIAGNOSES: 1. Acquired bilateral breast absence secondary to mastectomies for     treatment of breast cancer. 2. Changing lesions of cheek, bilateral.  PROCEDURES: 1. Removal of bilateral breast expanders with capsulotomy and     placement of implants, Mentor smooth round high profile gel     implants [id number] bilaterally. 2. Excision of bilateral changing skin lesions of cheek, 0.5 cm each,     with primary closure.  ATTENDING SURGEON:  Wayland Denis, DO.  ASSISTANT:  Shawn Rayburn, P.A.  ANESTHESIA:  General.  INDICATION FOR PROCEDURE:  The patient is a 66 year old female who underwent bilateral mastectomies for breast cancer and immediate reconstruction using expanders and FlexHD.  She presents for exchange of the expanders and placement of implants.  During her postoperative period, she had a reaction to her chemotherapy treatment and was treated with coronary artery bypass graft.  With these developments, she did not want to proceed with any further expansion beyond where she was prior to surgery and decided for the exchange.  She also had changing skin lesions on her face and desired to have those excised at the same time. Risks and complications were reviewed including bleeding, pain, scar, risk of anesthesia, capsular contracture, infection, and loss of implant.  Consent was signed and confirmed.  DESCRIPTION OF PROCEDURE:  The patient was taken to the operating room and  placed on the operating room table in the supine position.  General anesthesia was administered.  Once adequate, a time-out was called.  All information was confirmed to be correct.  She was prepped and draped in the usual sterile fashion.  A #15 blade was used to make an elliptical incision to remove the previous mastectomy scar.  The skin was sent to pathology for evaluation.  Using the electrocautery in the caudad direction, the layers were dissected in order to obtain access to the expander.  The expander was deflated and removed.  The pocket was irrigated with antibiotic solution.  Capsulotomies were done superiorly and medially, but the capsule on the lateral and inferior borders were kept intact since the position was good.  Hemostasis was achieved with electrocautery.  The 425 mL smooth round high profile gel implant was selected and put in.  It was deficient, therefore, removed and the smooth round high profile gel [id number] implant at 450 mL was selected and placed in the pocket.  The muscle layer was closed with 3-0 Vicryl figure-of-eight sutures and then followed by a 4-0 Vicryl and a 5-0 Monocryl was used to reapproximate the skin edges.  The same exact procedure was done on both sides.  Dermabond, ABDs, and a breast binder were applied.  Attention was then turned towards the face.  A new prep was used.  The face was prepped.  Time-out was  called.  All information was confirmed to be correct.  Elliptical markings were made around the lesions, one on the left cheek and one on the right.  The 1% lidocaine with epinephrine was injected under the lesion after waiting several minutes for the epinephrine to have its effect.  A #15 blade was used to make an elliptical incision, removed the lesion.  The lesions were then marked and sent to pathology; right changing skin lesion and left changing skin lesion.  The skin edges were reapproximated with 6-0 Monocryl.  Dermabond and  Steri-Strips were applied.  The patient tolerated the procedure well.  There were no complications.  She was awoken and taken to recovery room in stable condition.     Wayland Denis, DO     CS/MEDQ  D:  04/09/2012  T:  04/10/2012  Job:  [id number]

## 2012-04-10 NOTE — Telephone Encounter (Signed)
Pt given po appt. 

## 2012-04-10 NOTE — Discharge Summary (Signed)
Physician Discharge Summary  Patient ID: Jamie [name] MRN: [id number] DOB/AGE: Oct 14, 1945 66 y.o.  Admit date: 04/09/2012 Discharge date: 04/10/2012  Admission Diagnoses: Acquired absence of breasts and nipples due to breast cancer bilaterally, changing lesions bilateral face  Discharge Diagnoses:  Active Problems:  Acquired absence of bilateral breasts and nipples   Discharged Condition: good  Hospital Course: [name]  has presented today for surgery, with the diagnosis of breast cancer  with acquired absence of breasts and nipples. She underwent placement of bilateral breast expanders, with complicated post operative course with MI following chemotherapy. She has recovered sufficiently and was cleared by cardiology for her exchange procedure. She underwent the below procedures and did well and was monitored overnight and did well and was discharged home with the assistance of her husband.  BREAST CAPSULECTOMY WITH IMPLANT EXCHANGE (Bilateral) - removal of bilateral breast tissue expanders capsulectomy and placement of implants REMOVAL PORT-A-CATH (N/A) as a surgical intervention .   Excision bilateral facial changing lesions.   Consults: None  Significant Diagnostic Studies: Specimens sent to pathology  Treatments: surgery: BREAST CAPSULECTOMY WITH IMPLANT EXCHANGE (Bilateral) - removal of bilateral breast tissue expanders capsulectomy and placement of implants REMOVAL PORT-A-CATH (N/A)- per Dr. Carolynne Edouard Excision bilateral changing facial lesions.   Discharge Exam: Blood pressure 115/60, pulse 79, temperature 97.8 F (36.6 C), temperature source Oral, resp. rate 16, height 5\' 5"  (1.651 m), weight 114.76 kg (253 lb), SpO2 94.00%. General appearance: alert, cooperative and no distress Resp: clear to auscultation bilaterally Breasts: Bilateral breast incisions and port site are intact and without drainage. No evidence of hematoma  Cardio: regular rate and rhythm Bilateral facial  incisions with steri-strips in place and without drainage.   Disposition: 01-Home or Self Care  Discharge Orders    Future Appointments: Provider: Department: Dept Phone: Center:   05/05/2012 10:00 AM [facility] Dexa 1 [facility] [id number] [facility]   05/28/2012 10:45 AM Windell Hummingbird [facility] [phone] None   05/28/2012 11:15 AM Amy Allegra Grana, PA [facility] [phone] None   07/13/2012 9:00 AM Laurey Morale, MD [facility] [phone] [facility]     Medication List  As of 04/10/2012  7:48 AM   TAKE these medications         anastrozole 1 MG tablet   Commonly known as: ARIMIDEX   Take 1 tablet (1 mg total) by mouth daily.      aspirin 325 MG EC tablet   Take 325 mg by mouth daily.      atorvastatin 80 MG tablet   Commonly known as: LIPITOR   Take 1 tablet (80 mg total) by mouth daily at 6 PM.      DSS 100 MG Caps   Take 100 mg by mouth 3 (three) times daily.      JUICE PLUS FIBRE PO   Take 6 each by mouth daily.      lisinopril 5 MG tablet   Commonly known as: PRINIVIL,ZESTRIL   Take 1 tablet (5 mg total) by mouth daily.      loratadine 10 MG tablet   Commonly known as: CLARITIN   Take 10 mg by mouth daily.      metFORMIN 500 MG tablet   Commonly known as: GLUCOPHAGE      metoprolol 50 MG tablet   Commonly known as: LOPRESSOR   Take 1 tablet (50 mg total) by mouth 2 (two) times daily.      solifenacin 10 MG tablet  Commonly known as: VESICARE   Take by mouth daily.           Follow-up Information    Follow up with TOTH III,PAUL S, MD in 3 weeks.   Contact information:   [address] [place] [address] [phone]       Follow up with [facility], DO in 2 weeks.   Contact information:   [address] [place] [address] [phone]          Signed: Lazaro Arms 04/10/2012, 7:48 AM

## 2012-04-14 ENCOUNTER — Encounter (HOSPITAL_BASED_OUTPATIENT_CLINIC_OR_DEPARTMENT_OTHER): Payer: Self-pay

## 2012-04-30 ENCOUNTER — Encounter (INDEPENDENT_AMBULATORY_CARE_PROVIDER_SITE_OTHER): Payer: Self-pay | Admitting: General Surgery

## 2012-04-30 ENCOUNTER — Ambulatory Visit (INDEPENDENT_AMBULATORY_CARE_PROVIDER_SITE_OTHER): Payer: Medicare Other | Admitting: General Surgery

## 2012-04-30 VITALS — BP 126/72 | HR 76 | Temp 97.3°F | Resp 20 | Ht 65.0 in | Wt 250.0 lb

## 2012-04-30 DIAGNOSIS — C50919 Malignant neoplasm of unspecified site of unspecified female breast: Secondary | ICD-10-CM

## 2012-04-30 NOTE — Patient Instructions (Signed)
Continue regular self exams  

## 2012-05-04 ENCOUNTER — Telehealth (INDEPENDENT_AMBULATORY_CARE_PROVIDER_SITE_OTHER): Payer: Self-pay | Admitting: General Surgery

## 2012-05-04 NOTE — Telephone Encounter (Signed)
Pt called to report she has experienced a mild amount of swelling in her Lt arm.  She is going on a cruise and was not sure what to do.  Explained about the lymphedema clinic, where she can get an elastic sleeve for it.  She was amenable to do this.

## 2012-05-05 ENCOUNTER — Inpatient Hospital Stay: Admission: RE | Admit: 2012-05-05 | Payer: Medicare Other | Source: Ambulatory Visit

## 2012-05-05 NOTE — Progress Notes (Signed)
Subjective:     Patient ID: Jamie [name], female   DOB: 1945/08/19, 66 y.o.   MRN: [id number]  HPI The patient is a 66 year old white female who is about 5 months out from bilateral mastectomies and a left axillary lymph node dissection for a left-sided T2 N1 a left breast cancer. She had reconstruction was done. Her course has been complicated by emergent coronary artery bypass surgery. She tolerated this well and is recovering nicely. She is much more active now than she was prior to her original breast cancer surgery. She has some minor soreness but otherwise has no complaints today. She recently had her Port-A-Cath removed and tolerated this well.  Review of Systems  Constitutional: Negative.   HENT: Negative.   Eyes: Negative.   Respiratory: Negative.   Cardiovascular: Negative.   Gastrointestinal: Negative.   Genitourinary: Negative.   Musculoskeletal: Negative.   Skin: Negative.   Neurological: Negative.   Hematological: Negative.   Psychiatric/Behavioral: Negative.        Objective:   Physical Exam  Constitutional: She is oriented to person, place, and time. She appears well-developed and well-nourished.  HENT:  Head: Normocephalic and atraumatic.  Eyes: Conjunctivae normal and EOM are normal. Pupils are equal, round, and reactive to light.  Neck: Normal range of motion. Neck supple.  Cardiovascular: Normal rate, regular rhythm and normal heart sounds.   Pulmonary/Chest: Effort normal and breath sounds normal.       Her mastectomy incisions are healing nicely with no sign of infection. There is no palpable mass in either reconstructed breast. There is no palpable axillary or supraclavicular cervical lymphadenopathy. Her port site looks good  Abdominal: Soft. Bowel sounds are normal. She exhibits no mass. There is no tenderness.  Musculoskeletal: Normal range of motion.  Lymphadenopathy:    She has no cervical adenopathy.  Neurological: She is alert and oriented to person,  place, and time.  Skin: Skin is warm and dry.  Psychiatric: She has a normal mood and affect. Her behavior is normal.       Assessment:     5 months status post bilateral mastectomies, left axillary lymph node dissection, and reconstructions    Plan:     At this point she will continue to do regular self exams. We will plan to see her back in about 3 months.

## 2012-05-07 ENCOUNTER — Ambulatory Visit (INDEPENDENT_AMBULATORY_CARE_PROVIDER_SITE_OTHER): Payer: Medicare Other | Admitting: General Surgery

## 2012-05-07 ENCOUNTER — Ambulatory Visit: Payer: Medicare Other | Attending: General Surgery | Admitting: Physical Therapy

## 2012-05-07 ENCOUNTER — Encounter (INDEPENDENT_AMBULATORY_CARE_PROVIDER_SITE_OTHER): Payer: Self-pay | Admitting: General Surgery

## 2012-05-07 VITALS — BP 142/82 | HR 72 | Temp 97.9°F | Resp 18 | Ht 65.0 in | Wt 258.6 lb

## 2012-05-07 DIAGNOSIS — IMO0001 Reserved for inherently not codable concepts without codable children: Secondary | ICD-10-CM | POA: Insufficient documentation

## 2012-05-07 DIAGNOSIS — M79609 Pain in unspecified limb: Secondary | ICD-10-CM | POA: Insufficient documentation

## 2012-05-07 DIAGNOSIS — I890 Lymphedema, not elsewhere classified: Secondary | ICD-10-CM

## 2012-05-07 MED ORDER — CEPHALEXIN 500 MG PO CAPS: 500.0000 mg | ORAL_CAPSULE | Freq: Four times a day (QID) | ORAL | Status: AC

## 2012-05-07 NOTE — Patient Instructions (Signed)
Take keflex if you notice redness of left arm

## 2012-05-07 NOTE — Progress Notes (Signed)
Subjective:     Patient ID: Jamie [name], female   DOB: [birth year], 66 y.o.   MRN: [id number]  HPI The patient is a 66 year old white female who we just saw. She is 5 months out from bilateral mastectomies and a left axillary lymph node dissection for a T2 N1 left breast cancer. She did have reconstructions performed as well. Her course has been complicated by emergent coronary artery bypass grafting surgery. She is recovering nicely from that. Since her last visit a couple weeks ago she has developed some swelling and some discomfort in her left arm.   Review of Systems  Constitutional: Negative.   HENT: Negative.   Eyes: Negative.   Respiratory: Negative.   Cardiovascular: Negative.   Gastrointestinal: Negative.   Genitourinary: Negative.   Musculoskeletal: Negative.   Skin: Negative.   Neurological: Negative.   Hematological: Negative.   Psychiatric/Behavioral: Negative.        Objective:   Physical Exam  Constitutional: She is oriented to person, place, and time. She appears well-developed and well-nourished.  HENT:  Head: Normocephalic and atraumatic.  Eyes: Conjunctivae normal and EOM are normal. Pupils are equal, round, and reactive to light.  Neck: Normal range of motion. Neck supple.  Cardiovascular: Normal rate, regular rhythm and normal heart sounds.   Pulmonary/Chest: Effort normal and breath sounds normal.  Abdominal: Soft. Bowel sounds are normal.  Musculoskeletal: Normal range of motion.       There is patchy swelling and edema of areas of the ulnar side of the upper and lower arm  Neurological: She is alert and oriented to person, place, and time.  Skin: Skin is warm and dry.  Psychiatric: She has a normal mood and affect. Her behavior is normal.       Assessment:     5 months status post bilateral mastectomies and left axillary lymph node dissection with reconstruction. She now has some small areas of lymphedema of the left arm.    Plan:     We will plan  to get her fitted for compression sleeve. I have also written her a prescription for Keflex to take if she starts noticing some redness of the left arm. We will plan to see her back in one month to check her progress.

## 2012-05-28 ENCOUNTER — Encounter: Payer: Self-pay | Admitting: Physician Assistant

## 2012-05-28 ENCOUNTER — Other Ambulatory Visit (HOSPITAL_BASED_OUTPATIENT_CLINIC_OR_DEPARTMENT_OTHER): Payer: Medicare Other | Admitting: Lab

## 2012-05-28 ENCOUNTER — Ambulatory Visit (HOSPITAL_BASED_OUTPATIENT_CLINIC_OR_DEPARTMENT_OTHER): Payer: Medicare Other | Admitting: Physician Assistant

## 2012-05-28 ENCOUNTER — Telehealth: Payer: Self-pay | Admitting: Oncology

## 2012-05-28 VITALS — BP 145/80 | HR 80 | Temp 97.9°F | Resp 20 | Ht 65.0 in | Wt 255.2 lb

## 2012-05-28 DIAGNOSIS — Z170 Estrogen receptor positive status [ER+]: Secondary | ICD-10-CM

## 2012-05-28 DIAGNOSIS — C50919 Malignant neoplasm of unspecified site of unspecified female breast: Secondary | ICD-10-CM

## 2012-05-28 DIAGNOSIS — Z780 Asymptomatic menopausal state: Secondary | ICD-10-CM

## 2012-05-28 LAB — CBC WITH DIFFERENTIAL/PLATELET
Basophils Absolute: 0 10*3/uL (ref 0.0–0.1)
Eosinophils Absolute: 0.2 10*3/uL (ref 0.0–0.5)
HCT: 38 % (ref 34.8–46.6)
HGB: 12.5 g/dL (ref 11.6–15.9)
LYMPH%: 23.3 % (ref 14.0–49.7)
MCV: 87.1 fL (ref 79.5–101.0)
MONO#: 0.4 10*3/uL (ref 0.1–0.9)
MONO%: 7.3 % (ref 0.0–14.0)
NEUT#: 4.1 10*3/uL (ref 1.5–6.5)
NEUT%: 66.3 % (ref 38.4–76.8)
Platelets: 205 10*3/uL (ref 145–400)
RBC: 4.36 10*6/uL (ref 3.70–5.45)
WBC: 6.2 10*3/uL (ref 3.9–10.3)

## 2012-05-28 NOTE — Patient Instructions (Signed)
Continue anastrazole - no change  Bone Density - next available appt  Return in Feb 2014 for repeat labs and visit  Call with problems or questions   [phone]

## 2012-05-28 NOTE — Telephone Encounter (Signed)
gve the pt her nov [facility] appt [facility] along with the feb appt calendar for 2014

## 2012-05-28 NOTE — Progress Notes (Signed)
ID: Jamie [name]   DOB: 14-Oct-1945  MR#: [id number]  EXB#:[id number]  HISTORY OF PRESENT ILLNESS: The patient noted a mass in her left breast and brought it to the attention of her primary physician, Dr. Joetta Manners, who set her up for diagnostic mammography. This showed a mass of about 3 cm in diameter (I do not have a copy of that study or comparison with prior mammography from July of 2012). The patient was referred to Dr. Carolynne Edouard and he set her up for biopsy of the [facility]. The pathology showed [phone] an invasive ductal carcinoma, grade 2, estrogen receptor 100% positive, progesterone receptor 96% positive, with an MIB-1 of 30%, and HER-2 equivocal with the ratio by C[facility] of 1.81.  Diagnostic right mammography was then performed 08/28/2011. Multiple masses were noted, and there were multiple soft tissue nodules by palpation. Ultrasound specifically showed 2 hypoechoic round masses measuring 1.3 and 0.9 cm respectively. 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 With this information and after extensive discussion the patient opted for bilateral mastectomies with left sentinel lymph node sampling, and this was performed 10/18/2011, with results and further treatment as discussed below.  INTERVAL HISTORY: Jamie [name] returns today for followup of her left breast carcinoma.  She continues on anastrozole which she started in May of 2013 and is tolerating extremely well. She's had no hot flashes and denies any increased vaginal dryness or increased arthralgias.  Interval history is remarkable for the patient having undergone reconstruction under Dr. Leonie Green care in August. She had planned a cruise in October with some friends and family. Although she was able to go on a cruise, she had a terrible case of food poisoning and has been extremely sick throughout the month of October. She continues on antibiotics for colitis,  although her symptoms are gradually improving.   REVIEW OF SYSTEMS: [place] has had no recent fevers or chills. Currently, she denies any nausea. She has had no cough, increased shortness of breath, or chest pain. She has some postsurgical discomfort in the areas of her incisions, but this is chronic. She has had no abnormal headaches, dizziness, or change in vision. No increased peripheral swelling.  A detailed review of systems is otherwise stable and noncontributory.  PAST MEDICAL HISTORY: Past Medical History  Diagnosis Date  . Asthma   . COPD (chronic obstructive pulmonary disease)   . UTI (lower urinary tract infection)     freq-bladder implant -removed  . Hx of migraines   . Breast cancer 08/2011  . CAD (coronary artery disease)     NSTEMI 5/13:  LHC demonstrated oLM 75%, pLAD 80%, RCA occluded, filled by collaterals from the LAD.  Echo 12/10/11: EF 60%.; CABG 12/13/11: LIMA-LAD, SVG-RCA, SVG-diagonal and OM.   Marland Kitchen HLD (hyperlipidemia)   . DM2 (diabetes mellitus, type 2)   . Carotid stenosis     Pre-CABG Dopplers 5/13: Bilateral 40-59%  . Hypertension   . Myocardial infarction   . Obesity   . Sleep apnea     sleep study2-3 yrs ago lost weight afterward but gained back  hx tobacco abuse, 80 P/Y, resolved  PAST SURGICAL HISTORY: Past Surgical History  Procedure Date  . Cesarean section 04/1976  . Abdominal hysterectomy 1977  . Laparoscopic gastric banding 2010  . Nasal sinus surgery   . Tonsillectomy   . Appendectomy   . Mastectomy w/ sentinel node biopsy 10/18/2011    Procedure: MASTECTOMY WITH  SENTINEL LYMPH NODE BIOPSY;  Surgeon: Robyne Askew, MD;  Location: [facility] OR;  Service: General;  Laterality: Bilateral;  bilateral mastectomy and left sentinel node biopsy  . Axillary lymph node dissection 10/18/2011    Procedure: AXILLARY LYMPH NODE DISSECTION;  Surgeon: Robyne Askew, MD;  Location: [facility] OR;  Service: General;  Laterality: Left;  . Breast reconstruction 10/18/2011     Procedure: BREAST RECONSTRUCTION;  Surgeon: Wayland Denis, DO;  Location: [facility] OR;  Service: Plastics;  Laterality: Bilateral;   bilateral breast reconstruction with bilateral tissue expander and placement of flex hd.  . US echocardiography     at [facility]  . Portacath placement 12/02/2011    Procedure: INSERTION PORT-A-CATH;  Surgeon: Robyne Askew, MD;  Location: [facility] OR;  Service: General;  Laterality: Right;  . Coronary artery bypass graft 12/13/2011    Procedure: CORONARY ARTERY BYPASS GRAFTING (CABG);  Surgeon: Alleen Borne, MD;  Location: [facility] OR;  Service: Open Heart Surgery;  Laterality: N/A;  Times four using endoscopically harvested left greater saphenous vein and left internal mammary artery. Right greater saphenous vein attempted; not appropriate for vein harvest.  . Lesion removal 04/09/2012    Procedure: LESION REMOVAL;  Surgeon: Wayland Denis, DO;  Location: [facility];  Service: Plastics;  Laterality: Bilateral;  . Port-a-cath removal 04/09/2012    Procedure: REMOVAL PORT-A-CATH;  Surgeon: Robyne Askew, MD;  Location: [facility];  Service: General;  Laterality: Right;  no salpingo-oophorectomy  FAMILY HISTORY Family History  Problem Relation Age of Onset  . Anesthesia problems Neg Hx   . Hypotension Neg Hx   . Malignant hyperthermia Neg Hx   . Pseudochol deficiency Neg Hx   . Heart disease Mother   . Breast cancer Maternal Aunt   . Ovarian cancer Paternal Aunt   . Breast cancer Maternal Aunt   The patient's father died at the age of 20. He had a history of asbestos exposure. The patient's mother died at the age of 40 from heart disease. The patient had one brother, no sisters. There is no breast or ovarian cancer in the immediate family but 2 of the patient's 5 maternal aunts had breast cancer (she does not know the age of diagnosis), and one of her father's sisters had ovarian cancer.  GYNECOLOGIC HISTORY: She had menarche age 69, no firm  date for her menopause as she had hysterectomy remotely. She is GX P2, with a pair of twins and a daughter as her biological children as noted below. She never took hormone replacement.  SOCIAL HISTORY: She owns a tuxedo shop. Her husband of 34 years, [name], is retired but used to own a Designer, television/film set. He is currently working in IT with one of his own sons. At home in addition to the patient and her husband is one of the patient's daughters and her 2 children, as well as one of Forrest sons. Altogether they have 6 children and 15 grandchildren. The patient attends [employer]    ADVANCED DIRECTIVES:  HEALTH MAINTENANCE: History  Substance Use Topics  . Smoking status: Former Smoker -- 2.0 packs/day    Types: Cigarettes    Quit date: 08/25/2006  . Smokeless tobacco: Never Used   Comment: social drinker 2-3 yr  . Alcohol Use: Yes     rarely     Colonoscopy: 2012  PAP: s/p hysterectomy  Bone density:  Lipid panel:  Allergies  Allergen Reactions  .  Vicodin (Hydrocodone-Acetaminophen) Other (See Comments)    syncope  . Sulfa Antibiotics Other (See Comments)    Unknown; childhood allergy    Current Outpatient Prescriptions  Medication Sig Dispense Refill  . amoxicillin-clavulanate (AUGMENTIN) 875-125 MG per tablet       . anastrozole (ARIMIDEX) 1 MG tablet Take 1 tablet (1 mg total) by mouth daily.  90 tablet  12  . aspirin 325 MG EC tablet Take 325 mg by mouth daily.      Marland Kitchen atorvastatin (LIPITOR) 80 MG tablet Take 1 tablet (80 mg total) by mouth daily at 6 PM.  90 tablet  3  . lisinopril (PRINIVIL,ZESTRIL) 5 MG tablet Take 1 tablet (5 mg total) by mouth daily.  90 tablet  3  . loratadine (CLARITIN) 10 MG tablet Take 10 mg by mouth daily.      . metoprolol (LOPRESSOR) 50 MG tablet Take 1 tablet (50 mg total) by mouth 2 (two) times daily.  180 tablet  3  . metroNIDAZOLE (FLAGYL) 500 MG tablet       . Nutritional Supplements (JUICE PLUS FIBRE PO) Take 6 each by mouth daily.       . simvastatin (ZOCOR) 20 MG tablet       . solifenacin (VESICARE) 10 MG tablet Take by mouth daily.      . trospium (SANCTURA) 20 MG tablet       . oxyCODONE-acetaminophen (PERCOCET/ROXICET) 5-325 MG per tablet         OBJECTIVE: Middle-aged white woman who appears slightly fatigued. Filed Vitals:   05/28/12 1129  BP: 145/80  Pulse: 80  Temp: 97.9 F (36.6 C)  Resp: 20     Body mass index is 42.47 kg/(m^2).    ECOG FS: 2  Filed Weights   05/28/12 1129  Weight: 255 lb 3.2 oz (115.758 kg)   Physical Exam: HEENT:  Sclerae anicteric.  Oropharynx clear.     Nodes:  No cervical, supraclavicular, or axillary lymphadenopathy palpated.  Breast Exam:  Status post bilateral mastectomies with implant reconstruction. There is no evidence of local recurrence. Lungs:  Clear to auscultation bilaterally.  No crackles, rhonchi, or wheezes.   Heart:  Regular rate and rhythm.   Abdomen:  Soft, obese, nontender.  Positive bowel sounds.   Musculoskeletal:  No focal spinal tenderness to palpation.  Extremities: Minimal bilateral ankle edema, nonpitting Neuro:  Nonfocal. Alert and oriented x3.    LAB RESULTS: Lab Results  Component Value Date   WBC 6.2 05/28/2012   NEUTROABS 4.1 05/28/2012   HGB 12.5 05/28/2012   HCT 38.0 05/28/2012   MCV 87.1 05/28/2012   PLT 205 05/28/2012      Chemistry      Component Value Date/Time   NA 141 04/09/2012 1224   K 4.3 04/09/2012 1224   CL 106 04/09/2012 1224   CO2 24 02/24/2012 1327   BUN 16 04/09/2012 1224   CREATININE 0.90 04/09/2012 1224      Component Value Date/Time   CALCIUM 9.5 02/24/2012 1327   ALKPHOS 76 02/24/2012 1327   AST 36 02/24/2012 1327   ALT 36* 02/24/2012 1327   BILITOT 0.4 02/24/2012 1327       Lab Results  Component Value Date   LABCA2 27 09/03/2011     STUDIES: No results found.   ASSESSMENT: 66 year-old [name] woman   (1)  s/p bilateral mastectomies and left axillary lymph node dissection 10/18/2011 for a left-sided  pT2 pN1, stage IIB invasive ductal carcinoma, grade  2, estrogen receptor 100% and progesterone receptor 96% positive, with no HER-2 amplification (1.81, repeat 1.35), Mib-1 of 30%. The right breast showed no malignancy.  (2) Plan was to treat in the adjuvant setting with 4 dose dense cycles of doxorubicin/cyclophosphamide followed by 4 dose dense cycles of paclitaxel. Doxorubicin given by continuous infusion over 72 hours, with Neulasta given at the time of pump removal. Patient received a truncated cycle 1, the continuous infusion disconnected after proximally 6 hours when the patient was admitted to the hospital with unstable angina.  (3)  During hospitalization, CT angio negative for PE, echo showing well-preserved EF, enzymes/ECG showing a NSTEMI, s/p cath May 7 showing left main, LAD and RCA disease with a well-preserved ejection fraction, revascularization performed 12/13/2011 with an uneventful postoperative course  (4) the patient opted against further chemotherapy and also against post-mastectomy irradiation; she started anastrozole May 2013   PLAN:  From a breast cancer standpoint, [place] is doing quite well, and is tolerating the anastrozole well. She will continue, with no change our plan. The plan is to continue aromatase inhibitor therapy for a total of 5 years, then reassess. I will completely restage her 2 years from her definitive surgery, which will be March of 2015.  We are scheduling a bone density at the next available appointment, and we will see the patient back in 3 months for routine labs and followup visit.  She knows to call for any problems that may develop before the next visit.   Jaimeson Gopal    05/28/2012

## 2012-06-05 ENCOUNTER — Ambulatory Visit
Admission: RE | Admit: 2012-06-05 | Discharge: 2012-06-05 | Disposition: A | Discharge status: Home / Self Care | Payer: Medicare Other | Source: Ambulatory Visit | Attending: Physician Assistant | Admitting: Physician Assistant

## 2012-06-05 DIAGNOSIS — Z780 Asymptomatic menopausal state: Secondary | ICD-10-CM

## 2012-06-05 DIAGNOSIS — C50919 Malignant neoplasm of unspecified site of unspecified female breast: Secondary | ICD-10-CM

## 2012-06-11 ENCOUNTER — Encounter (INDEPENDENT_AMBULATORY_CARE_PROVIDER_SITE_OTHER): Payer: Medicare Other | Admitting: General Surgery

## 2012-06-16 ENCOUNTER — Ambulatory Visit (INDEPENDENT_AMBULATORY_CARE_PROVIDER_SITE_OTHER): Payer: Medicare Other | Admitting: General Surgery

## 2012-06-16 ENCOUNTER — Encounter (INDEPENDENT_AMBULATORY_CARE_PROVIDER_SITE_OTHER): Payer: Self-pay | Admitting: General Surgery

## 2012-06-16 VITALS — BP 130/74 | HR 78 | Temp 97.3°F | Ht 65.5 in | Wt 253.8 lb

## 2012-06-16 DIAGNOSIS — C50919 Malignant neoplasm of unspecified site of unspecified female breast: Secondary | ICD-10-CM

## 2012-06-16 DIAGNOSIS — I890 Lymphedema, not elsewhere classified: Secondary | ICD-10-CM

## 2012-06-16 NOTE — Patient Instructions (Signed)
Wear compression sleeve during the day and sleep with arm elevated

## 2012-07-09 ENCOUNTER — Encounter: Payer: Self-pay | Admitting: *Deleted

## 2012-07-13 ENCOUNTER — Ambulatory Visit (INDEPENDENT_AMBULATORY_CARE_PROVIDER_SITE_OTHER): Payer: Medicare Other | Admitting: Cardiology

## 2012-07-13 ENCOUNTER — Encounter: Payer: Self-pay | Admitting: Cardiology

## 2012-07-13 VITALS — BP 154/85 | HR 69 | Ht 65.5 in | Wt 257.0 lb

## 2012-07-13 DIAGNOSIS — I25810 Atherosclerosis of coronary artery bypass graft(s) without angina pectoris: Secondary | ICD-10-CM

## 2012-07-13 DIAGNOSIS — I2510 Atherosclerotic heart disease of native coronary artery without angina pectoris: Secondary | ICD-10-CM

## 2012-07-13 DIAGNOSIS — I509 Heart failure, unspecified: Secondary | ICD-10-CM

## 2012-07-13 DIAGNOSIS — E669 Obesity, unspecified: Secondary | ICD-10-CM

## 2012-07-13 DIAGNOSIS — E785 Hyperlipidemia, unspecified: Secondary | ICD-10-CM

## 2012-07-13 DIAGNOSIS — I6529 Occlusion and stenosis of unspecified carotid artery: Secondary | ICD-10-CM

## 2012-07-13 MED ORDER — LISINOPRIL 10 MG PO TABS: 10.0000 mg | ORAL_TABLET | Freq: Every day | ORAL | Status: DC

## 2012-07-13 NOTE — Patient Instructions (Addendum)
Increase lisinopril to 10 mg daily. You can take 2 of your 5mg  tablets daily at the same time and use your current supply.  Your physician recommends that you return for lab work in: 2 weeks--BMET.  Your physician has requested that you have a carotid duplex. This test is an ultrasound of the carotid arteries in your neck. It looks at blood flow through these arteries that supply the brain with blood. Allow one hour for this exam. There are no restrictions or special instructions. May 2014  Your physician wants you to follow-up in: 6 months with Dr Shirlee Latch. (June 2014).  You will receive a reminder letter in the mail two months in advance. If you don't receive a letter, please call our office to schedule the follow-up appointment.

## 2012-07-13 NOTE — Progress Notes (Signed)
Patient ID: Jamie [name], female   DOB: 1945-11-06, 66 y.o.   MRN: [id number] PCP:  Raynelle Jan., MD   66 yo with a history of recently diagnosed breast cancer, s/p bilateral mastectomies, DM2, sleep apnea, COPD, obesity, HL.  She was admitted in 5/13 with diastolic CHF in the setting of NSTEMI. LHC demonstrated oLM 75%, pLAD 80%, RCA occluded, filled by collaterals from the LAD.  Echo 12/10/11: EF 60%.  She was referred for bypass surgery.  Pre-CABG Dopplers: Bilateral 40-59%;  ABIs > 1 bilaterally and indicative of bilateral calcified vessels.  CABG 12/13/11: LIMA-LAD, SVG-RCA, SVG-diagonal and OM.  Postop course was fairly uneventful.  She remained in sinus rhythm.   Since last appointment, Jamie [name] has been stable.  She is not going to have any more chemotherapy or radiation.  She is on anastrozole.   No chest pain.  Mild dyspnea with a flight of steps or walking a long distance.  She had a severe episode of infectious colitis in 10/13 while on a cruise that took a while to recover from.   ECG: NSR, old inferior MI   Labs (7/13): K 4.4, creatinine 0.79, ALT 36, AST 31, HCT 43 Labs (8/13): LDL 49, HDL 41  PMH: 1. Asthma 2. Diet-controlled diabetes 3. OSA 4. Frequent UTIs 5. Migraines 6. Breast cancer: s/p bilateral mastectomies, now on Letrozole.  7. CAD: NSTEMI in 5/13 with severe 3VD.  CABG 5/13 with LIMA-LAD, SVG-RCA, SVG-D, and SVG-OM.   8.  Hyperlipidemia 9. Carotid stenosis: 5/13 dopplers with 40-59% bilateral ICAs. 10. Obesity: h/o lap banding.  11. Diastolic CHF: Echo (5/13) with EF 60%, technically difficult study.    Current Outpatient Prescriptions  Medication Sig Dispense Refill  . anastrozole (ARIMIDEX) 1 MG tablet Take 1 tablet (1 mg total) by mouth daily.  90 tablet  12  . aspirin 325 MG EC tablet Take 325 mg by mouth daily.      Marland Kitchen atorvastatin (LIPITOR) 80 MG tablet Take 1 tablet (80 mg total) by mouth daily at 6 PM.  90 tablet  3  . lisinopril (PRINIVIL,ZESTRIL) 5  MG tablet Take 1 tablet (5 mg total) by mouth daily.  90 tablet  3  . loratadine (CLARITIN) 10 MG tablet Take 10 mg by mouth daily.      . metoprolol (LOPRESSOR) 50 MG tablet Take 1 tablet (50 mg total) by mouth 2 (two) times daily.  180 tablet  3  . metroNIDAZOLE (FLAGYL) 500 MG tablet       . Nutritional Supplements (JUICE PLUS FIBRE PO) Take 6 each by mouth daily.      Marland Kitchen oxyCODONE-acetaminophen (PERCOCET/ROXICET) 5-325 MG per tablet       . simvastatin (ZOCOR) 20 MG tablet       . solifenacin (VESICARE) 10 MG tablet Take by mouth daily.      . trospium (SANCTURA) 20 MG tablet         Allergies: Allergies  Allergen Reactions  . Vicodin (Hydrocodone-Acetaminophen) Other (See Comments)    syncope  . Sulfa Antibiotics Other (See Comments)    Unknown; childhood allergy   SH: Nonsmoker.  Lives with husband in [place].   FH: CAD.    ROS:  Please see the history of present illness.   All other systems reviewed and negative.   PHYSICAL EXAM: BP 154/85  Pulse 69  Ht 5' 5.5" (1.664 m)  Wt 257 lb (116.574 kg)  BMI 42.12 kg/m2 General: NAD, obese Neck: No JVD, no  thyromegaly or thyroid nodule.  Lungs: Clear to auscultation bilaterally with normal respiratory effort. CV: Nondisplaced PMI.  Heart regular S1/S2, no S3/S4, no murmur.  No peripheral edema.  Left carotid bruit.  Normal pedal pulses.  Abdomen: Soft, nontender, no hepatosplenomegaly, no distention.  Neurologic: Alert and oriented x 3.  Psych: Normal affect. Extremities: No clubbing or cyanosis.   Assessment/Plan:  CAD  Stable s/p CABG. Continue ASA, statin, ACEI, metoprolol. No ischemic symptoms.  CHF  H/o diastolic CHF. She is doing well symptomatically and does not appear volume overloaded. Hyperlipidemia Well-controlled LDL, continue atorvastatin 80.  Obesity We talked at length today about diet/exercise strategies for weight loss. She needs to get back to pool walking (can do this at the [facility]).  Carotid  stenosis Need repeat carotid dopplers in 5/14.  Hypertension BP has been running high, increase lisinopril to 10 mg daily.  BMET in 2 wks.   [name] 07/13/2012

## 2012-07-27 ENCOUNTER — Other Ambulatory Visit (INDEPENDENT_AMBULATORY_CARE_PROVIDER_SITE_OTHER): Payer: Medicare Other

## 2012-07-27 DIAGNOSIS — I25810 Atherosclerosis of coronary artery bypass graft(s) without angina pectoris: Secondary | ICD-10-CM

## 2012-07-27 LAB — BASIC METABOLIC PANEL
Calcium: 9.1 mg/dL (ref 8.4–10.5)
GFR: 91.48 mL/min (ref >60.00)
Glucose, Bld: 123 mg/dL — ABNORMAL HIGH (ref 70–99)
Potassium: 3.8 mEq/L (ref 3.5–5.1)
Sodium: 139 mEq/L (ref 135–145)

## 2012-08-10 ENCOUNTER — Encounter (INDEPENDENT_AMBULATORY_CARE_PROVIDER_SITE_OTHER): Payer: Medicare Other | Admitting: General Surgery

## 2012-08-11 NOTE — Progress Notes (Signed)
Subjective:     Patient ID: Jamie [name], female   DOB: [birth year], 67 y.o.   MRN: [id number]  HPI The patient is a 67 year old white female who is 6 months status post bilateral mastectomies and left axillary lymph node dissection for a T2 N1 left breast cancer. Postoperatively she ended up having emergency cardiac bypass surgery. She seems to be doing well. Her main complaint is of some left arm swelling.  Review of Systems  Constitutional: Negative.   HENT: Negative.   Eyes: Negative.   Respiratory: Negative.   Cardiovascular: Negative.   Gastrointestinal: Negative.   Genitourinary: Negative.   Musculoskeletal: Positive for joint swelling.  Skin: Negative.   Neurological: Negative.   Hematological: Negative.   Psychiatric/Behavioral: Negative.        Objective:   Physical Exam  Constitutional: She is oriented to person, place, and time. She appears well-developed and well-nourished.  HENT:  Head: Normocephalic and atraumatic.  Eyes: Conjunctivae normal and EOM are normal. Pupils are equal, round, and reactive to light.  Neck: Normal range of motion. Neck supple.  Cardiovascular: Normal rate, regular rhythm and normal heart sounds.   Pulmonary/Chest: Effort normal and breath sounds normal.       There is no palpable mass of either reconstructed breast. There is no palpable axillary supraclavicular cervical lymphadenopathy.  Abdominal: Soft. Bowel sounds are normal. She exhibits no mass. There is no tenderness.  Musculoskeletal: Normal range of motion.       She does have some swelling of the left arm.  Lymphadenopathy:    She has no cervical adenopathy.  Neurological: She is alert and oriented to person, place, and time.  Skin: Skin is warm and dry.  Psychiatric: She has a normal mood and affect. Her behavior is normal.       Assessment:     6 months status post bilateral mastectomies and left axillary lymph node dissection for breast cancer    Plan:     At this  point I would like her to continue to wear her compression sleeve on her left arm or frequently and try to sleep with her left arm elevated. She will continue to take arimidex. We will plan to see her back in about 3 months.

## 2012-08-12 ENCOUNTER — Ambulatory Visit (INDEPENDENT_AMBULATORY_CARE_PROVIDER_SITE_OTHER): Payer: Medicare Other | Admitting: General Surgery

## 2012-08-12 ENCOUNTER — Encounter (INDEPENDENT_AMBULATORY_CARE_PROVIDER_SITE_OTHER): Payer: Self-pay | Admitting: General Surgery

## 2012-08-12 VITALS — BP 128/70 | HR 83 | Temp 98.4°F | Ht 65.5 in | Wt 261.4 lb

## 2012-08-12 DIAGNOSIS — C50919 Malignant neoplasm of unspecified site of unspecified female breast: Secondary | ICD-10-CM

## 2012-08-12 DIAGNOSIS — I890 Lymphedema, not elsewhere classified: Secondary | ICD-10-CM

## 2012-08-12 NOTE — Patient Instructions (Signed)
Wear sleeve as much as you can Contact medical supply to see if you need a glove too

## 2012-08-12 NOTE — Progress Notes (Signed)
Subjective:     Patient ID: Jamie [name], female   DOB: 02/20/1946, 67 y.o.   MRN: [id number]  HPI The patient is a 67 year old white female who is 8 months status post bilateral mastectomies and left axillary lymph node dissection for a T2 N1 left breast cancer. Since her last visit she has not been wearing her compression sleeve and she continues to have some swelling of the left arm. She states that she is on a chronic dose of antibiotic for a bladder infection and this seems to have helped the soreness in the left arm. She also had an episode of abdominal pain and diarrhea around [place] but this has resolved.  Review of Systems  Constitutional: Negative.   HENT: Negative.   Eyes: Negative.   Respiratory: Negative.   Cardiovascular: Positive for chest pain.  Gastrointestinal: Negative.   Genitourinary: Negative.   Musculoskeletal: Positive for joint swelling.  Skin: Negative.   Neurological: Negative.   Hematological: Negative.   Psychiatric/Behavioral: Negative.        Objective:   Physical Exam  Constitutional: She is oriented to person, place, and time. She appears well-developed and well-nourished.  HENT:  Head: Normocephalic and atraumatic.  Eyes: Conjunctivae normal and EOM are normal. Pupils are equal, round, and reactive to light.  Neck: Normal range of motion. Neck supple.  Cardiovascular: Normal rate, regular rhythm and normal heart sounds.   Pulmonary/Chest: Effort normal and breath sounds normal.       There is no palpable mass of either reconstructed breast. There is no palpable axillary supraclavicular or cervical lymphadenopathy. She continues to have some swelling of her left arm which is stable  Abdominal: Soft. Bowel sounds are normal. She exhibits no mass. There is no tenderness.  Musculoskeletal: Normal range of motion.  Lymphadenopathy:    She has no cervical adenopathy.  Neurological: She is alert and oriented to person, place, and time.  Skin: Skin  is warm and dry.  Psychiatric: She has a normal mood and affect. Her behavior is normal.       Assessment:     The patient is 8 months status post bilateral mastectomies and left axillary lymph node dissection for breast cancer    Plan:     At this point she will continue to take anastrozole. She will continue to do regular self exams. I've encouraged her to wear her compression sleeve as often as possible. I've also asked her to contact the medical supply store and see if she also needs to be fitted for a glove. We will plan to see her back in about 3 months.

## 2012-09-24 ENCOUNTER — Encounter: Payer: Self-pay | Admitting: *Deleted

## 2012-09-24 ENCOUNTER — Telehealth: Payer: Self-pay | Admitting: Cardiology

## 2012-09-24 NOTE — Telephone Encounter (Signed)
Advised pt she should be fine.

## 2012-09-24 NOTE — Telephone Encounter (Signed)
New Problem:    Patient called in wanting to know if the mesh form her bypass surgery would set of metal detectors.  Please call back.

## 2012-10-01 ENCOUNTER — Ambulatory Visit: Payer: Medicare Other | Admitting: Oncology

## 2012-10-01 ENCOUNTER — Other Ambulatory Visit: Payer: Medicare Other | Admitting: Lab

## 2012-10-16 ENCOUNTER — Telehealth: Payer: Self-pay | Admitting: *Deleted

## 2012-10-16 NOTE — Telephone Encounter (Signed)
This RN was contacted by Dr Karl Bales office per new assessment of area near R surgical site.  Per MD review request for to come in for him to quickly assess area for possible other interventions.  This RN called pt and spoke with her per above. She states she will come in Monday and ask for this RN.

## 2012-10-19 ENCOUNTER — Other Ambulatory Visit: Payer: Self-pay | Admitting: Oncology

## 2012-10-19 ENCOUNTER — Other Ambulatory Visit: Payer: Self-pay | Admitting: *Deleted

## 2012-10-19 ENCOUNTER — Ambulatory Visit (HOSPITAL_BASED_OUTPATIENT_CLINIC_OR_DEPARTMENT_OTHER): Payer: Medicare Other | Admitting: Oncology

## 2012-10-19 DIAGNOSIS — Z170 Estrogen receptor positive status [ER+]: Secondary | ICD-10-CM

## 2012-10-19 DIAGNOSIS — C50919 Malignant neoplasm of unspecified site of unspecified female breast: Secondary | ICD-10-CM

## 2012-10-19 DIAGNOSIS — N63 Unspecified lump in unspecified breast: Secondary | ICD-10-CM

## 2012-10-19 DIAGNOSIS — N6310 Unspecified lump in the right breast, unspecified quadrant: Secondary | ICD-10-CM

## 2012-10-19 NOTE — Progress Notes (Signed)
Jamie [name] was at [employer] with her family when she noted a bump in her right chest wall, near her mastectomy scar. She brought this to Dr.SPRY,HEATHER M., MD's attention, and she suggested [name] come here for further evaluation.  A detailed review of systems today is otherwise entirely stable. In particular she denies any bony aches or pains, unusual headaches, visual changes, nausea, vomiting, or change in bowel or bladder habits.  Exam of this area shows that in the medial aspect of the right mastectomy scar, and slightly superior to it, there is a 3 x 2 mm subcutaneous mass, which is clearly not fatty tissue and not obviously scar tissue. Recall her cancer was on the left side.  I do agree this needs to be biopsied, and I have entered the appropriate orders. Hopefully she can have the biopsy done tomorrow so we will have the results by Thursday afternoon. That is when she will call, since I will be out of town on Friday. Otherwise she was supposed to see me of March 24, but if all turns out well we will postpone that by at least 3 months.  She knows to call for any other problems that may develop before the next visit.

## 2012-10-20 ENCOUNTER — Telehealth: Payer: Self-pay | Admitting: Oncology

## 2012-10-20 ENCOUNTER — Other Ambulatory Visit: Payer: Self-pay | Admitting: Oncology

## 2012-10-20 NOTE — Telephone Encounter (Signed)
Pt already on schedule for bx @ [facility] 3/26. S/w pt husband today confirming bx appt for 3/26. Per husband they are aware.

## 2012-10-21 ENCOUNTER — Other Ambulatory Visit: Payer: Self-pay | Admitting: Oncology

## 2012-10-21 ENCOUNTER — Other Ambulatory Visit: Payer: Self-pay | Admitting: Physician Assistant

## 2012-10-21 DIAGNOSIS — N6310 Unspecified lump in the right breast, unspecified quadrant: Secondary | ICD-10-CM

## 2012-10-26 ENCOUNTER — Encounter: Payer: Self-pay | Admitting: Oncology

## 2012-10-26 ENCOUNTER — Other Ambulatory Visit: Payer: Self-pay | Admitting: Lab

## 2012-10-27 ENCOUNTER — Telehealth: Payer: Self-pay | Admitting: Oncology

## 2012-10-27 NOTE — Telephone Encounter (Signed)
Per 3/24 pof pt to see mid-level in 3 months. Called pt with appt and was informed by her husband that she is supposed to see GM after her bx on 3/26. Emailed GM to confirm when pt is to be seen. Husband aware.

## 2012-10-28 ENCOUNTER — Telehealth: Payer: Self-pay | Admitting: Oncology

## 2012-10-28 ENCOUNTER — Ambulatory Visit
Admission: RE | Admit: 2012-10-28 | Discharge: 2012-10-28 | Disposition: A | Discharge status: Home / Self Care | Payer: Medicare Other | Source: Ambulatory Visit | Attending: Oncology | Admitting: Oncology

## 2012-10-28 DIAGNOSIS — N6310 Unspecified lump in the right breast, unspecified quadrant: Secondary | ICD-10-CM

## 2012-10-28 NOTE — Telephone Encounter (Signed)
Per email response from GM he will see pt 4/1 @ 8:30 am and pt is to keep the 3 month appt for 6/25. lmonvm for pt re both appt d/t's. I also called cell number and spoke with pt's husband re appt for 4/1 and that per GM pt to keep 6/25 appt also.

## 2012-10-29 ENCOUNTER — Other Ambulatory Visit: Payer: Self-pay | Admitting: Oncology

## 2012-10-30 ENCOUNTER — Telehealth: Payer: Self-pay | Admitting: Oncology

## 2012-10-30 NOTE — Telephone Encounter (Signed)
Pt called today and cx'd 4/1 appt. Per pt/husband she s/w GM and he told her this appt was not necessary.

## 2012-11-03 ENCOUNTER — Ambulatory Visit: Payer: Medicare Other | Admitting: Oncology

## 2012-12-15 ENCOUNTER — Encounter (INDEPENDENT_AMBULATORY_CARE_PROVIDER_SITE_OTHER): Payer: Medicare Other

## 2012-12-15 DIAGNOSIS — I6529 Occlusion and stenosis of unspecified carotid artery: Secondary | ICD-10-CM

## 2012-12-15 DIAGNOSIS — I25810 Atherosclerosis of coronary artery bypass graft(s) without angina pectoris: Secondary | ICD-10-CM

## 2013-01-26 ENCOUNTER — Encounter: Payer: Self-pay | Admitting: Cardiology

## 2013-01-26 ENCOUNTER — Ambulatory Visit (INDEPENDENT_AMBULATORY_CARE_PROVIDER_SITE_OTHER): Payer: Medicare Other | Admitting: Cardiology

## 2013-01-26 VITALS — BP 122/70 | HR 78 | Ht 65.5 in | Wt 261.0 lb

## 2013-01-26 DIAGNOSIS — R0989 Other specified symptoms and signs involving the circulatory and respiratory systems: Secondary | ICD-10-CM

## 2013-01-26 DIAGNOSIS — R0609 Other forms of dyspnea: Secondary | ICD-10-CM

## 2013-01-26 DIAGNOSIS — I5042 Chronic combined systolic (congestive) and diastolic (congestive) heart failure: Secondary | ICD-10-CM

## 2013-01-26 DIAGNOSIS — I6529 Occlusion and stenosis of unspecified carotid artery: Secondary | ICD-10-CM

## 2013-01-26 DIAGNOSIS — E785 Hyperlipidemia, unspecified: Secondary | ICD-10-CM

## 2013-01-26 DIAGNOSIS — I2510 Atherosclerotic heart disease of native coronary artery without angina pectoris: Secondary | ICD-10-CM

## 2013-01-26 DIAGNOSIS — I509 Heart failure, unspecified: Secondary | ICD-10-CM

## 2013-01-26 DIAGNOSIS — E669 Obesity, unspecified: Secondary | ICD-10-CM

## 2013-01-26 NOTE — Patient Instructions (Addendum)
Your physician recommends that you have  lab work today--TSH/BNP/CBCd   Your physician has requested that you have an echocardiogram. Echocardiography is a painless test that uses sound waves to create images of your heart. It provides your doctor with information about the size and shape of your heart and how well your heart's chambers and valves are working. This procedure takes approximately one hour. There are no restrictions for this procedure.   Your physician has recommended that you have a sleep study. This test records several body functions during sleep, including: brain activity, eye movement, oxygen and carbon dioxide blood levels, heart rate and rhythm, breathing rate and rhythm, the flow of air through your mouth and nose, snoring, body muscle movements, and chest and belly movement.  Your physician recommends that you schedule a follow-up appointment on Thursday August 21,2014 at 8:45AM with Dr Shirlee Latch.

## 2013-01-27 ENCOUNTER — Telehealth: Payer: Self-pay | Admitting: Oncology

## 2013-01-27 ENCOUNTER — Encounter: Payer: Self-pay | Admitting: Physician Assistant

## 2013-01-27 ENCOUNTER — Ambulatory Visit (HOSPITAL_BASED_OUTPATIENT_CLINIC_OR_DEPARTMENT_OTHER): Payer: Medicare Other | Admitting: Physician Assistant

## 2013-01-27 ENCOUNTER — Ambulatory Visit (INDEPENDENT_AMBULATORY_CARE_PROVIDER_SITE_OTHER): Payer: Medicare Other | Admitting: Pulmonary Disease

## 2013-01-27 ENCOUNTER — Encounter: Payer: Self-pay | Admitting: Pulmonary Disease

## 2013-01-27 ENCOUNTER — Other Ambulatory Visit: Payer: Medicare Other | Admitting: Lab

## 2013-01-27 VITALS — BP 133/78 | HR 79 | Temp 98.1°F | Resp 20 | Ht 64.0 in | Wt 261.3 lb

## 2013-01-27 VITALS — BP 134/80 | HR 61 | Temp 98.5°F | Ht 64.0 in | Wt 263.4 lb

## 2013-01-27 DIAGNOSIS — C50919 Malignant neoplasm of unspecified site of unspecified female breast: Secondary | ICD-10-CM

## 2013-01-27 DIAGNOSIS — J449 Chronic obstructive pulmonary disease, unspecified: Secondary | ICD-10-CM

## 2013-01-27 DIAGNOSIS — C50912 Malignant neoplasm of unspecified site of left female breast: Secondary | ICD-10-CM

## 2013-01-27 DIAGNOSIS — G4733 Obstructive sleep apnea (adult) (pediatric): Secondary | ICD-10-CM

## 2013-01-27 LAB — CBC WITH DIFFERENTIAL/PLATELET
Basophils Relative: 0.4 % (ref 0.0–3.0)
Eosinophils Relative: 2.1 % (ref 0.0–5.0)
Lymphocytes Relative: 20.6 % (ref 12.0–46.0)
Monocytes Absolute: 0.6 10*3/uL (ref 0.1–1.0)
Monocytes Relative: 6.6 % (ref 3.0–12.0)
Neutrophils Relative %: 70.3 % (ref 43.0–77.0)
Platelets: 194 10*3/uL (ref 150.0–400.0)
RBC: 4.78 Mil/uL (ref 3.87–5.11)
WBC: 9.2 10*3/uL (ref 4.5–10.5)

## 2013-01-27 LAB — BRAIN NATRIURETIC PEPTIDE: Pro B Natriuretic peptide (BNP): 47 pg/mL (ref 0.0–100.0)

## 2013-01-27 LAB — TSH: TSH: 0.9 u[IU]/mL (ref 0.35–5.50)

## 2013-01-27 NOTE — Telephone Encounter (Signed)
, °

## 2013-01-27 NOTE — Patient Instructions (Addendum)
Lung function is reduced at 68% , but unchanged from 2009 Use albuterol as needed for dyspnea You will benefit from a conditioning program -OK to join the Y Await results of thyroid test & sleep study

## 2013-01-27 NOTE — Progress Notes (Signed)
ID: Jamie [name]   DOB: [birth year]  MR#: [id number]  HYQ#:[id number]  HISTORY OF PRESENT ILLNESS: The patient noted a mass in her left breast and brought it to the attention of her primary physician, Dr. Joetta Manners, who set her up for diagnostic mammography. This showed a mass of about 3 cm in diameter (I do not have a copy of that study or comparison with prior mammography from July of 2012). The patient was referred to Dr. Carolynne Edouard and he set her up for biopsy of the [facility]. The pathology showed [phone]) an invasive ductal carcinoma, grade 2, estrogen receptor 100% positive, progesterone receptor 96% positive, with an MIB-1 of 30%, and HER-2 equivocal with the ratio by CISH of 1.81.  Diagnostic right mammography was then performed 08/28/2011. Multiple masses were noted, and there were multiple soft tissue nodules by palpation. Ultrasound specifically showed 2 hypoechoic round masses measuring 1.3 and 0.9 cm respectively. 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 With this information and after extensive discussion the patient opted for bilateral mastectomies with left sentinel lymph node sampling, and this was performed 10/18/2011, with results and further treatment as discussed below.  INTERVAL HISTORY: Jamie [name] returns today accompanied by her husband, Forrest, for followup of her left breast carcinoma.  She continues on anastrozole which she is tolerating well. She has no significant hot flashes. She denies any increased joint pain.  She has some chronic pain in the lower back that comes and goes, but this has not changed and did not worsen been beginning the anastrozole. She's had no vaginal dryness.  Jamie [name]'s biggest complaint today is just a significant decrease in energy. She tells me she is being "worked up" by her primary care physician, and has also seen her pulmonologist. Thus far, all of the tests/labs have  been unremarkable. She is scheduled for an echocardiogram next week, and a sleep study next month. Of course none of this is felt to be associated with her breast cancer, and she does have a history of COPD.   Interval history is remarkable for Jamie [name] having undergone a biopsy in March 2014 for what was a benign area of fat necrosis in the right chest wall, in the medial aspect of the right mastectomy scar.     REVIEW OF SYSTEMS: Jamie [name] has had no recent fevers or chills. Currently, she denies any nausea , emesis, or change in bowel habits. She has chronic shortness of breath and chronic cough, neither of which have worsened. She denies any palpitations or chest pain.  She has had no abnormal headaches, dizziness, or change in vision. No increased peripheral swelling.  A detailed review of systems is otherwise stable and noncontributory.  PAST MEDICAL HISTORY: Past Medical History  Diagnosis Date  . Asthma   . COPD (chronic obstructive pulmonary disease)   . UTI (lower urinary tract infection)     freq-bladder implant -removed  . Hx of migraines   . Breast cancer 08/2011  . CAD (coronary artery disease)     NSTEMI 5/13:  LHC demonstrated oLM 75%, pLAD 80%, RCA occluded, filled by collaterals from the LAD.  Echo 12/10/11: EF 60%.; CABG 12/13/11: LIMA-LAD, SVG-RCA, SVG-diagonal and OM.   Marland Kitchen HLD (hyperlipidemia)   . DM2 (diabetes mellitus, type 2)   . Carotid stenosis     Pre-CABG Dopplers 5/13: Bilateral 40-59%  . Hypertension   . Myocardial infarction   . Obesity   .  Sleep apnea     sleep study2-3 yrs ago lost weight afterward but gained back  hx tobacco abuse, 80 P/Y, resolved  PAST SURGICAL HISTORY: Past Surgical History  Procedure Laterality Date  . Cesarean section  04/1976  . Abdominal hysterectomy  1977  . Laparoscopic gastric banding  2010  . Nasal sinus surgery    . Tonsillectomy    . Appendectomy    . Mastectomy w/ sentinel node biopsy  10/18/2011    Procedure: MASTECTOMY  WITH SENTINEL LYMPH NODE BIOPSY;  Surgeon: Robyne Askew, MD;  Location: [facility] OR;  Service: General;  Laterality: Bilateral;  bilateral mastectomy and left sentinel node biopsy  . Axillary lymph node dissection  10/18/2011    Procedure: AXILLARY LYMPH NODE DISSECTION;  Surgeon: Robyne Askew, MD;  Location: [facility] OR;  Service: General;  Laterality: Left;  . Breast reconstruction  10/18/2011    Procedure: BREAST RECONSTRUCTION;  Surgeon: Wayland Denis, DO;  Location: [facility] OR;  Service: Plastics;  Laterality: Bilateral;   bilateral breast reconstruction with bilateral tissue expander and placement of flex hd.  . US echocardiography      at [facility]  . Portacath placement  12/02/2011    Procedure: INSERTION PORT-A-CATH;  Surgeon: Robyne Askew, MD;  Location: [facility] OR;  Service: General;  Laterality: Right;  . Coronary artery bypass graft  12/13/2011    Procedure: CORONARY ARTERY BYPASS GRAFTING (CABG);  Surgeon: Alleen Borne, MD;  Location: [facility] OR;  Service: Open Heart Surgery;  Laterality: N/A;  Times four using endoscopically harvested left greater saphenous vein and left internal mammary artery. Right greater saphenous vein attempted; not appropriate for vein harvest.  . Lesion removal  04/09/2012    Procedure: LESION REMOVAL;  Surgeon: Wayland Denis, DO;  Location: [facility];  Service: Plastics;  Laterality: Bilateral;  . Port-a-cath removal  04/09/2012    Procedure: REMOVAL PORT-A-CATH;  Surgeon: Robyne Askew, MD;  Location: [facility];  Service: General;  Laterality: Right;  no salpingo-oophorectomy  FAMILY HISTORY Family History  Problem Relation Age of Onset  . Anesthesia problems Neg Hx   . Hypotension Neg Hx   . Malignant hyperthermia Neg Hx   . Pseudochol deficiency Neg Hx   . Heart disease Mother   . Breast cancer Maternal Aunt   . Ovarian cancer Paternal Aunt   . Breast cancer Maternal Aunt   . Heart disease Father   The patient's father died at the  age of 19. He had a history of asbestos exposure. The patient's mother died at the age of 32 from heart disease. The patient had one brother, no sisters. There is no breast or ovarian cancer in the immediate family but 2 of the patient's 5 maternal aunts had breast cancer (she does not know the age of diagnosis), and one of her father's sisters had ovarian cancer.  GYNECOLOGIC HISTORY: She had menarche age 30, no firm date for her menopause as she had hysterectomy remotely. She is GX P2, with a pair of twins and a daughter as her biological children as noted below. She never took hormone replacement.  SOCIAL HISTORY: She owns a tuxedo shop. Her husband of 34 years, [name], is retired but used to own a Designer, television/film set. He is currently working in IT with one of his own sons. At home in addition to the patient and her husband is one of the patient's daughters and her 2 children, as well  as one of Forrest sons. Altogether they have 6 children and 15 grandchildren. The patient attends [employer]    ADVANCED DIRECTIVES:  HEALTH MAINTENANCE: History  Substance Use Topics  . Smoking status: Former Smoker -- 2.00 packs/day for 40 years    Types: Cigarettes    Quit date: 08/25/2006  . Smokeless tobacco: Never Used  . Alcohol Use: Yes     Comment: 1-2 times a year     Colonoscopy: 2012  PAP: s/p hysterectomy  Bone density:  Dec 2013, Normal  Lipid panel: August 2013, Dr. Carolyne Fiscal  Allergies  Allergen Reactions  . Vicodin (Hydrocodone-Acetaminophen) Other (See Comments)    syncope  . Sulfa Antibiotics Other (See Comments)    Unknown; childhood allergy    Current Outpatient Prescriptions  Medication Sig Dispense Refill  . anastrozole (ARIMIDEX) 1 MG tablet Take 1 tablet (1 mg total) by mouth daily.  90 tablet  12  . aspirin 325 MG EC tablet Take 325 mg by mouth daily.      Marland Kitchen atorvastatin (LIPITOR) 80 MG tablet Take 1 tablet (80 mg total) by mouth daily at 6 PM.  90 tablet  3  .  fluticasone (FLONASE) 50 MCG/ACT nasal spray as directed.      Marland Kitchen lisinopril (PRINIVIL,ZESTRIL) 10 MG tablet Take 1 tablet (10 mg total) by mouth daily.  30 tablet  6  . loratadine (CLARITIN) 10 MG tablet Take 10 mg by mouth daily.      . metFORMIN (GLUCOPHAGE) 500 MG tablet Take 1 tablet by mouth 2 (two) times daily.      . metoprolol (LOPRESSOR) 50 MG tablet Take 1 tablet (50 mg total) by mouth 2 (two) times daily.  180 tablet  3  . nitrofurantoin, macrocrystal-monohydrate, (MACROBID) 100 MG capsule Take 1 capsule by mouth daily.      . Nutritional Supplements (JUICE PLUS FIBRE PO) Take 6 each by mouth daily.      Marland Kitchen trimethoprim (TRIMPEX) 100 MG tablet as directed.      . trospium (SANCTURA) 20 MG tablet Take 1 tablet by mouth 2 (two) times daily.       No current facility-administered medications for this visit.    OBJECTIVE: Middle-aged white woman who appears slightly fatigued but is in no acute distress Filed Vitals:   01/27/13 1513  BP: 133/78  Pulse: 79  Temp: 98.1 F (36.7 C)  Resp: 20     Body mass index is 44.83 kg/(m^2).    ECOG FS: 2  Filed Weights   01/27/13 1513  Weight: 261 lb 4.8 oz (118.525 kg)   Physical Exam: HEENT:  Sclerae anicteric.  Oropharynx clear.     Nodes:  No cervical, supraclavicular, or axillary lymphadenopathy palpated.  Breast Exam:  Status post bilateral mastectomies with implant reconstruction. There is no evidence of local recurrence. Lungs:  Clear to auscultation bilaterally.  No crackles, rhonchi, or wheezes.   Heart:  Regular rate and rhythm. No murmurs  Abdomen:  Soft, obese, nontender.  Positive bowel sounds.   Musculoskeletal:  No focal spinal tenderness to palpation.  Extremities: No edema  Neuro:  Nonfocal. Well oriented with friendly affect    LAB RESULTS:  Labs were drawn yesterday, 01/26/2013, by the patient's primary care physician, Dr. Joetta Manners.  These included a CBC, BMET, TSH and BNP, all of which were normal.  Lab  Results  Component Value Date   WBC 9.2 01/26/2013   NEUTROABS 6.5 01/26/2013   HGB 13.8 01/26/2013  HCT 42.4 01/26/2013   MCV 88.6 01/26/2013   PLT 194.0 01/26/2013      Chemistry      Component Value Date/Time   NA 139 07/27/2012 0931   K 3.8 07/27/2012 0931   CL 105 07/27/2012 0931   CO2 25 07/27/2012 0931   BUN 14 07/27/2012 0931   CREATININE 0.7 07/27/2012 0931      Component Value Date/Time   CALCIUM 9.1 07/27/2012 0931   ALKPHOS 76 02/24/2012 1327   AST 36 02/24/2012 1327   ALT 36* 02/24/2012 1327   BILITOT 0.4 02/24/2012 1327       Lab Results  Component Value Date   LABCA2 27 09/03/2011     STUDIES: Most recent bone density on 07/08/2012 was normal.     ASSESSMENT: 67 year-old [name] woman   (1)  s/p bilateral mastectomies and left axillary lymph node dissection 10/18/2011 for a left-sided pT2 pN1, stage IIB invasive ductal carcinoma, grade 2, estrogen receptor 100% and progesterone receptor 96% positive, with no HER-2 amplification (1.81, repeat 1.35), Mib-1 of 30%. The right breast showed no malignancy.  (2) Plan was to treat in the adjuvant setting with 4 dose dense cycles of doxorubicin/cyclophosphamide followed by 4 dose dense cycles of paclitaxel. Doxorubicin given by continuous infusion over 72 hours, with Neulasta given at the time of pump removal. Patient received a truncated cycle 1, the continuous infusion disconnected after proximally 6 hours when the patient was admitted to the hospital with unstable angina.  (3)  During hospitalization, CT angio negative for PE, echo showing well-preserved EF, enzymes/ECG showing a NSTEMI, s/p cath May 7 showing left main, LAD and RCA disease with a well-preserved ejection fraction, revascularization performed 12/13/2011 with an uneventful postoperative course  (4) the patient opted against further chemotherapy and also against post-mastectomy irradiation; she started anastrozole May 2013   PLAN:  Adriahna appears to  be doing well with regards to her breast cancer, and I see no clinical evidence of disease recurrence. She will continue on the anastrozole, the plan being to continue for total of 5 years (May 2018), then reassess.  She will return in 6 months for routine followup, and at that time Dr. Darnelle Catalan will discuss restaging scans, likely to be obtained around March 2015 at her 2 year anniversary from definitive surgery. Monick voices understanding and agreement with this plan, and will call with any changes prior to her next appointment.   Corbin Hott    01/27/2013

## 2013-01-27 NOTE — Progress Notes (Signed)
Subjective:    Patient ID: Jamie [name], female    DOB: Nov 04, 1945, 67 y.o.   MRN: [id number]  HPI PCP- Herbert Seta Spry  67 year old ex-smoker presents for evaluation of excessive daytime fatigue and dyspnea. She underwent bilateral mastectomy for left breast cancer. This did involve a lymph node, unfortunately her first chemotherapy was complicated by an acute myocardial infarction. She subsequently required CABG in June 2013. Preop echo showed normal ejection fraction and she did recover but never fully he came her activity level. She was unable to participate in rehabilitation and instead chose to use her daughter swimming pool for exercise. Had LAP-BAND surgery in 2009 and her weight decreased from 280-2-30 pounds, but she has regained to 265 now. Prior to lap band surgery, she had PFTs that showed moderate restriction with FVC of 66%, FEV1 of 64% and a ratio 76 with DLCO 56%. Spirometry today shows unchanged prominent function with FEV1 of 67% and FVC of 71% with a ratio 73, again no evidence of airway obstruction. She did smoke about 80 pack years before she quit in 2009. Overnight polysomnogram in November 2009 showed mild obstructive sleep apnea with AHI of 11 events per hour with severe oxygen desaturation to 67%.  She spent 80 minutes with a saturation less than 88%. 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 She did not desaturate on walking  Past Medical History  Diagnosis Date  . Asthma   . COPD (chronic obstructive pulmonary disease)   . UTI (lower urinary tract infection)     freq-bladder implant -removed  . Hx of migraines   . Breast cancer 08/2011  . CAD (coronary artery disease)     NSTEMI 5/13:  LHC demonstrated oLM 75%, pLAD 80%, RCA occluded, filled by collaterals from the LAD.  Echo 12/10/11: EF 60%.; CABG 12/13/11: LIMA-LAD, SVG-RCA, SVG-diagonal and OM.   Marland Kitchen HLD (hyperlipidemia)   . DM2 (diabetes mellitus, type  2)   . Carotid stenosis     Pre-CABG Dopplers 5/13: Bilateral 40-59%  . Hypertension   . Myocardial infarction   . Obesity   . Sleep apnea     sleep study2-3 yrs ago lost weight afterward but gained back    Past Surgical History  Procedure Laterality Date  . Cesarean section  04/1976  . Abdominal hysterectomy  1977  . Laparoscopic gastric banding  2010  . Nasal sinus surgery    . Tonsillectomy    . Appendectomy    . Mastectomy w/ sentinel node biopsy  10/18/2011    Procedure: MASTECTOMY WITH SENTINEL LYMPH NODE BIOPSY;  Surgeon: Robyne Askew, MD;  Location: [facility] OR;  Service: General;  Laterality: Bilateral;  bilateral mastectomy and left sentinel node biopsy  . Axillary lymph node dissection  10/18/2011    Procedure: AXILLARY LYMPH NODE DISSECTION;  Surgeon: Robyne Askew, MD;  Location: [facility] OR;  Service: General;  Laterality: Left;  . Breast reconstruction  10/18/2011    Procedure: BREAST RECONSTRUCTION;  Surgeon: Wayland Denis, DO;  Location: [facility] OR;  Service: Plastics;  Laterality: Bilateral;   bilateral breast reconstruction with bilateral tissue expander and placement of flex hd.  . US echocardiography      at [facility]  . Portacath placement  12/02/2011    Procedure: INSERTION PORT-A-CATH;  Surgeon: Robyne Askew, MD;  Location: [facility] OR;  Service: General;  Laterality: Right;  . Coronary artery  bypass graft  12/13/2011    Procedure: CORONARY ARTERY BYPASS GRAFTING (CABG);  Surgeon: Alleen Borne, MD;  Location: [facility] OR;  Service: Open Heart Surgery;  Laterality: N/A;  Times four using endoscopically harvested left greater saphenous vein and left internal mammary artery. Right greater saphenous vein attempted; not appropriate for vein harvest.  . Lesion removal  04/09/2012    Procedure: LESION REMOVAL;  Surgeon: Wayland Denis, DO;  Location: [facility];  Service: Plastics;  Laterality: Bilateral;  . Port-a-cath removal  04/09/2012    Procedure: REMOVAL PORT-A-CATH;   Surgeon: Robyne Askew, MD;  Location: [facility];  Service: General;  Laterality: Right;   Allergies  Allergen Reactions  . Vicodin (Hydrocodone-Acetaminophen) Other (See Comments)    syncope  . Sulfa Antibiotics Other (See Comments)    Unknown; childhood allergy    History   Social History  . Marital Status: Married    Spouse Name: N/A    Number of Children: 6  . Years of Education: N/A   Occupational History  . retired    Social History Main Topics  . Smoking status: Former Smoker -- 2.00 packs/day for 40 years    Types: Cigarettes    Quit date: 08/25/2006  . Smokeless tobacco: Never Used  . Alcohol Use: Yes     Comment: 1-2 times a year  . Drug Use: No  . Sexually Active: Not Currently   Other Topics Concern  . Not on file   Social History Narrative  . No narrative on file    Family History  Problem Relation Age of Onset  . Anesthesia problems Neg Hx   . Hypotension Neg Hx   . Malignant hyperthermia Neg Hx   . Pseudochol deficiency Neg Hx   . Heart disease Mother   . Breast cancer Maternal Aunt   . Ovarian cancer Paternal Aunt   . Breast cancer Maternal Aunt   . Heart disease Father       Review of Systems  Constitutional: Negative for appetite change and unexpected weight change.  HENT: Negative for ear pain, congestion, sore throat, sneezing, trouble swallowing and dental problem.   Respiratory: Positive for cough and shortness of breath.   Cardiovascular: Negative for chest pain, palpitations and leg swelling.  Gastrointestinal: Negative for abdominal pain.  Musculoskeletal: Negative for joint swelling.  Skin: Negative for rash.  Neurological: Negative for headaches.  Psychiatric/Behavioral: The patient is not nervous/anxious.        Objective:   Physical Exam  Gen. Pleasant, obese, in no distress, normal affect ENT - no lesions, no post nasal drip, class 2-3 airway Neck: No JVD, no thyromegaly, no carotid bruits Lungs:  no use of accessory muscles, no dullness to percussion, decreased without rales or rhonchi  Cardiovascular: Rhythm regular, heart sounds  normal, no murmurs or gallops, no peripheral edema Abdomen: soft and non-tender, no hepatosplenomegaly, BS normal. Musculoskeletal: No deformities, no cyanosis or clubbing Neuro:  alert, non focal, no tremors       Assessment & Plan:

## 2013-01-27 NOTE — Progress Notes (Signed)
Patient ID: Jamie [name], female   DOB: March 31, 1946, 67 y.o.   MRN: [id number] PCP:  Jamie Jan., MD   67 yo with a history of breast cancer s/p bilateral mastectomies, DM2, sleep apnea, COPD, obesity, and hyperlipidemia.  She was admitted in 5/13 with diastolic CHF in the setting of NSTEMI. LHC demonstrated oLM 75%, pLAD 80%, RCA occluded, filled by collaterals from the LAD.  Echo 12/10/11: EF 60%.  She was referred for bypass surgery.  Pre-CABG Dopplers: Bilateral 40-59%;  ABIs > 1 bilaterally and indicative of bilateral calcified vessels.  CABG 12/13/11: LIMA-LAD, SVG-RCA, SVG-diagonal and OM.  Postop course was fairly uneventful.  She remained in sinus rhythm.   Since last appointment, Jamie [name] has been fairly stable. No chest pain.  Mild dyspnea with a flight of steps or walking a long distance.  Her main complaint today is fatigue.  This has been worse x 4-6 months.  She simply tires out easily.  She wants to start exercising but feels like she is too tired. Weight is up 4 lbs since last appointment.  She was seen by her PCP and set up for PFTs.  A sleep study was also suggested.   ECG: NSR, old inferior MI   Labs (7/13): K 4.4, creatinine 0.79, ALT 36, AST 31, HCT 43 Labs (8/13): LDL 49, HDL 41 Labs (6/14): creatinine 0.7  PMH: 1. Asthma 2. Diet-controlled diabetes 3. OSA: mild on prior sleep study.  Not on CPAP.  4. Frequent UTIs 5. Migraines 6. Breast cancer: s/p bilateral mastectomies, now on Letrozole.  7. CAD: NSTEMI in 5/13 with severe 3VD.  CABG 5/13 with LIMA-LAD, SVG-RCA, SVG-D, and SVG-OM.   8.  Hyperlipidemia 9. Carotid stenosis: 5/13 dopplers with 40-59% bilateral ICAs. 5/14 dopplers with 40-59% RICA.   10. Obesity: h/o lap banding.  11. Diastolic CHF: Echo (5/13) with EF 60%, technically difficult study.    Current Outpatient Prescriptions  Medication Sig Dispense Refill  . anastrozole (ARIMIDEX) 1 MG tablet Take 1 tablet (1 mg total) by mouth daily.  90 tablet  12   . aspirin 325 MG EC tablet Take 325 mg by mouth daily.      Marland Kitchen atorvastatin (LIPITOR) 80 MG tablet Take 1 tablet (80 mg total) by mouth daily at 6 PM.  90 tablet  3  . cephALEXin (KEFLEX) 250 MG capsule Take 1 tablet by mouth Daily.      . fluticasone (FLONASE) 50 MCG/ACT nasal spray as directed.      Marland Kitchen HYDROcodone-homatropine (HYCODAN) 5-1.5 MG/5ML syrup as directed.      Marland Kitchen lisinopril (PRINIVIL,ZESTRIL) 10 MG tablet Take 1 tablet (10 mg total) by mouth daily.  30 tablet  6  . loratadine (CLARITIN) 10 MG tablet Take 10 mg by mouth daily.      . metFORMIN (GLUCOPHAGE) 500 MG tablet Take 1 tablet by mouth 2 (two) times daily.      . metoprolol (LOPRESSOR) 50 MG tablet Take 1 tablet (50 mg total) by mouth 2 (two) times daily.  180 tablet  3  . nitrofurantoin, macrocrystal-monohydrate, (MACROBID) 100 MG capsule Take 1 capsule by mouth daily.      . Nutritional Supplements (JUICE PLUS FIBRE PO) Take 6 each by mouth daily.      Marland Kitchen oxyCODONE-acetaminophen (PERCOCET/ROXICET) 5-325 MG per tablet       . trimethoprim (TRIMPEX) 100 MG tablet as directed.      . trospium (SANCTURA) 20 MG tablet Take 1 tablet by mouth 2 (  two) times daily.       No current facility-administered medications for this visit.    Allergies: Allergies  Allergen Reactions  . Vicodin (Hydrocodone-Acetaminophen) Other (See Comments)    syncope  . Sulfa Antibiotics Other (See Comments)    Unknown; childhood allergy   SH: Nonsmoker.  Lives with husband in [place].   FH: CAD.    ROS:  Please see the history of present illness.   All other systems reviewed and negative.   PHYSICAL EXAM: BP 122/70  Pulse 78  Ht 5' 5.5" (1.664 m)  Wt 261 lb (118.389 kg)  BMI 42.76 kg/m2 General: NAD, obese Neck: Thick, no JVD, no thyromegaly or thyroid nodule.  Lungs: Clear to auscultation bilaterally with normal respiratory effort. CV: Nondisplaced PMI.  Heart regular S1/S2, no S3/S4, 1/6 SEM.  Trace ankle edema.  Left carotid bruit.   Normal pedal pulses.  Abdomen: Soft, nontender, no hepatosplenomegaly, no distention.  Neurologic: Alert and oriented x 3.  Psych: Normal affect. Extremities: No clubbing or cyanosis.   Assessment/Plan:  CAD  Stable s/p CABG. Continue ASA, statin, ACEI, metoprolol. No ischemic symptoms.  CHF  H/o diastolic CHF. She gets fatigued but has only mild exertional dyspnea and does not appear volume overloaded. - Given profound fatigue, will get an echo to make sure that LV systolic function remains preserved.  Hyperlipidemia Continue atorvastatin 80.  Carotid stenosis Need repeat carotid dopplers in 5/15.  Hypertension Good BP today.  Continue current lisinopril and metoprolol. Fatigue Worse x 4-6 months.  Could be due to obesity and deconditioning.  I will check TSH, CBC, and BNP.  As above getting echo.  I will also arrange for a sleep study.  Years ago, she had a sleep study showing mild OSA.  Will get echo as above. If workup is unremarkable, she will need to start a regular exercise regimen.   Jamie [name] 01/27/2013

## 2013-01-28 DIAGNOSIS — G4733 Obstructive sleep apnea (adult) (pediatric): Secondary | ICD-10-CM | POA: Insufficient documentation

## 2013-01-28 NOTE — Assessment & Plan Note (Signed)
Await results of thyroid test & sleep study - since her wt is unchanged I doubt that OSA will be worse when compared to 2009

## 2013-01-28 NOTE — Assessment & Plan Note (Signed)
Lung function is reduced at 68% , but unchanged from 2009 - more suggestive of restriction rather than obstruction Use albuterol as needed for dyspnea, I do not feel that LABA/LAMA indicated here Deconditioning seems to be a big issue & she has just not rebounded after her cardiac surgery -she will benefit from a conditioning program -[facility] since coming to rehab is a big issue for her

## 2013-02-04 ENCOUNTER — Ambulatory Visit (HOSPITAL_COMMUNITY): Payer: Medicare Other | Attending: Cardiology | Admitting: Radiology

## 2013-02-04 DIAGNOSIS — R0602 Shortness of breath: Secondary | ICD-10-CM | POA: Insufficient documentation

## 2013-02-04 DIAGNOSIS — I509 Heart failure, unspecified: Secondary | ICD-10-CM

## 2013-02-04 DIAGNOSIS — I2510 Atherosclerotic heart disease of native coronary artery without angina pectoris: Secondary | ICD-10-CM | POA: Insufficient documentation

## 2013-02-04 DIAGNOSIS — I5042 Chronic combined systolic (congestive) and diastolic (congestive) heart failure: Secondary | ICD-10-CM | POA: Insufficient documentation

## 2013-02-04 DIAGNOSIS — C50919 Malignant neoplasm of unspecified site of unspecified female breast: Secondary | ICD-10-CM | POA: Insufficient documentation

## 2013-02-04 DIAGNOSIS — E119 Type 2 diabetes mellitus without complications: Secondary | ICD-10-CM | POA: Insufficient documentation

## 2013-02-04 DIAGNOSIS — J449 Chronic obstructive pulmonary disease, unspecified: Secondary | ICD-10-CM | POA: Insufficient documentation

## 2013-02-04 DIAGNOSIS — E785 Hyperlipidemia, unspecified: Secondary | ICD-10-CM | POA: Insufficient documentation

## 2013-02-04 DIAGNOSIS — J4489 Other specified chronic obstructive pulmonary disease: Secondary | ICD-10-CM | POA: Insufficient documentation

## 2013-02-04 DIAGNOSIS — R0609 Other forms of dyspnea: Secondary | ICD-10-CM

## 2013-02-04 NOTE — Progress Notes (Signed)
Echocardiogram performed.  

## 2013-02-18 ENCOUNTER — Institutional Professional Consult (permissible substitution): Payer: Medicare Other | Admitting: Pulmonary Disease

## 2013-02-22 ENCOUNTER — Ambulatory Visit (HOSPITAL_BASED_OUTPATIENT_CLINIC_OR_DEPARTMENT_OTHER): Payer: Medicare Other | Attending: Cardiology | Admitting: Radiology

## 2013-02-22 VITALS — Ht 65.0 in | Wt 260.0 lb

## 2013-02-22 DIAGNOSIS — I509 Heart failure, unspecified: Secondary | ICD-10-CM | POA: Insufficient documentation

## 2013-02-22 DIAGNOSIS — R259 Unspecified abnormal involuntary movements: Secondary | ICD-10-CM | POA: Insufficient documentation

## 2013-02-22 DIAGNOSIS — I2510 Atherosclerotic heart disease of native coronary artery without angina pectoris: Secondary | ICD-10-CM | POA: Insufficient documentation

## 2013-02-22 DIAGNOSIS — G4733 Obstructive sleep apnea (adult) (pediatric): Secondary | ICD-10-CM | POA: Insufficient documentation

## 2013-02-22 DIAGNOSIS — R0609 Other forms of dyspnea: Secondary | ICD-10-CM

## 2013-02-22 DIAGNOSIS — E669 Obesity, unspecified: Secondary | ICD-10-CM | POA: Insufficient documentation

## 2013-02-22 DIAGNOSIS — E119 Type 2 diabetes mellitus without complications: Secondary | ICD-10-CM | POA: Insufficient documentation

## 2013-02-22 DIAGNOSIS — I5030 Unspecified diastolic (congestive) heart failure: Secondary | ICD-10-CM | POA: Insufficient documentation

## 2013-02-28 ENCOUNTER — Other Ambulatory Visit: Payer: Self-pay | Admitting: Oncology

## 2013-02-28 DIAGNOSIS — C50912 Malignant neoplasm of unspecified site of left female breast: Secondary | ICD-10-CM

## 2013-03-02 DIAGNOSIS — G4733 Obstructive sleep apnea (adult) (pediatric): Secondary | ICD-10-CM

## 2013-03-03 ENCOUNTER — Telehealth: Payer: Self-pay | Admitting: Pulmonary Disease

## 2013-03-03 NOTE — Telephone Encounter (Signed)
PSG showed only  mild OSA Weight loss alone should help If extremely symptomatic, can consider treatment with CPAP

## 2013-03-03 NOTE — Procedures (Signed)
NAME:  Jamie [name], Jamie [name]                 ACCOUNT NO.:  [id number]  MEDICAL RECORD NO.:  [id number]          PATIENT TYPE:  OUT  LOCATION:  SLEEP CENTER                 FACILITY:  [facility]  PHYSICIAN:  Oretha Milch, MD      DATE OF BIRTH:  1945-12-20  DATE OF STUDY:  03/02/2013                           NOCTURNAL POLYSOMNOGRAM  REFERRING PHYSICIAN:  [name], MD  INDICATION FOR STUDY:  Ms. Propst is a obese woman with diabetes, coronary artery disease, diastolic CHF.  The polysomnogram in this past has shown mild sleep apnea.  He underwent a recent CABG on May 2013 and sleeps in a recliner since. At the time of this study, she weighed 260 pounds with a height of 5 feet 5 inches, BMI of 43, neck size of 17 inches.  EPWORTH SLEEPINESS SCORE:  5.  This nocturnal polysomnogram was performed with a sleep technologist in attendance.  EEG, EOG, EMG, EKG, and respiratory parameters were recorded.  Sleep stages, arousals, limb movements, and respiratory data were scored according to criteria laid out by the American academy of Sleep Medicine.  SLEEP ARCHITECTURE:  Lights out was at 10:48 p.m., lights on was at 4:50 a.m.  Total sleep time was 275 minutes with a sleep period time of 309 minutes and a sleep efficiency of 76%.  Sleep latency was 52 minutes. Latency to REM sleep was 76 minutes and wake after sleep onset was 34 minutes.  Sleep stages of the percentage of total sleep time was N1 5%, N2 69%, N3 0%, and REM sleep 25% (70 minutes).  Supine sleep accounted for 121 minutes and supine REM sleep was noted for 21 minutes.  Longest period of REM sleep was around 3 a.m.  AROUSAL DATA:  There were 57 arousals with an arousal index of 12 events per hour.  Most of these were spontaneous.  RESPIRATORY DATA:  There were 3 obstructive apneas, 0 central apnea, 0 mixed apneas, and 19 hypopneas with an apnea-hypopnea index of 4.8 events per hour, 38 RERAs were noted with an RDI of 13 events per  hour. Longest hypopnea was 93 seconds and longest apnea was 12 seconds.  LIMB MOVEMENT DATA:  The PLM index was 55 events per hour.  However, the PLM arousal index was only 1.3 events per hour.  OXYGEN SATURATION DATA:  The desaturation index was 3.7 events per hour with the lowest desaturation of 87%.  He spent 0.5 minutes with a saturation less than 88%.  CARDIAC DATA:  The low heart rate was 47 beats per minute and the high heart rate was an artifact.  No arrhythmias were noted.  DISCUSSION:  She was desensitized with a medium full-face mask, but did not meet the criteria for intervention.  Note that she slept on a recliner during this study and she has been doing so since her bypass surgery.   IMPRESSIONS-RECOMMENDATIONS: 1. Mild obstructive sleep apnea with predominant of RERAs causing     sleep fragmentation and mild oxygen desaturation. 2. No evidence of cardiac arrhythmias, or behavioral disturbance     during sleep. 3. Quite a few periodic limb movements were noted, although these were  not associated with arousals, significance is unknown.  RECOMMENDATION: 1. Treatment for this mild degree of sleep-disordered breathing will     include weight loss alone.  CPAP can be trialed if she is very     symptomatic. 2. She should be cautioned against driving when sleepy.  She should be     asked to avoid medications with sedative side effects. 3. Please correlate with a clinical history of restless legs syndrome,     however, note that the PLMS were not associated with arousals.     Oretha Milch, MD    RVA/MEDQ  D:  03/02/2013 08:55:45  T:  03/03/2013 00:28:56  Job:  [id number]

## 2013-03-05 NOTE — Telephone Encounter (Signed)
lmomtcb x1 

## 2013-03-08 ENCOUNTER — Encounter: Payer: Self-pay | Admitting: Family Medicine

## 2013-03-08 ENCOUNTER — Telehealth: Payer: Self-pay | Admitting: *Deleted

## 2013-03-08 ENCOUNTER — Other Ambulatory Visit: Payer: Self-pay | Admitting: *Deleted

## 2013-03-08 DIAGNOSIS — E7800 Pure hypercholesterolemia, unspecified: Secondary | ICD-10-CM

## 2013-03-08 DIAGNOSIS — I2510 Atherosclerotic heart disease of native coronary artery without angina pectoris: Secondary | ICD-10-CM

## 2013-03-08 MED ORDER — METOPROLOL TARTRATE 50 MG PO TABS: 50.0000 mg | ORAL_TABLET | Freq: Two times a day (BID) | ORAL | Status: DC

## 2013-03-08 NOTE — Telephone Encounter (Signed)
From: Laurey Morale       Sent: Sunday March 07, 2013       Please tell patient that test showed only mild OSA.    03/08/13--pt notified

## 2013-03-09 NOTE — Telephone Encounter (Signed)
I spoke with patient. She stated Dr. Alford Highland office advised her of the results already. Nothing further needed

## 2013-03-25 ENCOUNTER — Ambulatory Visit (INDEPENDENT_AMBULATORY_CARE_PROVIDER_SITE_OTHER): Payer: Medicare Other | Admitting: Cardiology

## 2013-03-25 ENCOUNTER — Encounter: Payer: Self-pay | Admitting: Cardiology

## 2013-03-25 VITALS — BP 122/62 | HR 81 | Ht 65.5 in | Wt 266.0 lb

## 2013-03-25 DIAGNOSIS — G4733 Obstructive sleep apnea (adult) (pediatric): Secondary | ICD-10-CM

## 2013-03-25 DIAGNOSIS — I2510 Atherosclerotic heart disease of native coronary artery without angina pectoris: Secondary | ICD-10-CM

## 2013-03-25 DIAGNOSIS — I509 Heart failure, unspecified: Secondary | ICD-10-CM

## 2013-03-25 DIAGNOSIS — I6529 Occlusion and stenosis of unspecified carotid artery: Secondary | ICD-10-CM

## 2013-03-25 NOTE — Patient Instructions (Signed)
Your physician wants you to follow-up in: 6 months with Dr McLean. (February 2015).  You will receive a reminder letter in the mail two months in advance. If you don't receive a letter, please call our office to schedule the follow-up appointment.  

## 2013-03-26 NOTE — Progress Notes (Signed)
Patient ID: Jamie [name], female   DOB: 02-Jun-1946, 67 y.o.   MRN: [id number] PCP:  Raynelle Jan., MD   67 yo with a history of breast cancer s/p bilateral mastectomies, DM2, sleep apnea, COPD, obesity, and hyperlipidemia.  She was admitted in 5/13 with diastolic CHF in the setting of NSTEMI. LHC demonstrated oLM 75%, pLAD 80%, RCA occluded, filled by collaterals from the LAD.  She was referred for bypass surgery.  CABG 12/13/11: LIMA-LAD, SVG-RCA, SVG-diagonal and OM.  She had an echo in 7/14 showing normal EF 60-65%.  Sleep study in 7/14 showed only mild OSA.  She has gained another 5 lbs.    Patient reports stable fatigue and dyspnea with moderate exertion.  She is not getting regular exercise. She has been under considerable stress trying to care for a friend who has been ill.  Labs (7/13): K 4.4, creatinine 0.79, ALT 36, AST 31, HCT 43 Labs (8/13): LDL 49, HDL 41 Labs (6/14): creatinine 0.7, TSH normal, BNP 47, HCT 42.4  PMH: 1. Asthma 2. Diet-controlled diabetes 3. OSA: mild on 7/14 sleep study.  4. Frequent UTIs 5. Migraines 6. Breast cancer: s/p bilateral mastectomies, now on Letrozole.  7. CAD: NSTEMI in 5/13 with severe 3VD.  CABG 5/13 with LIMA-LAD, SVG-RCA, SVG-D, and SVG-OM.   8.  Hyperlipidemia 9. Carotid stenosis: 5/13 dopplers with 40-59% bilateral ICAs. 5/14 dopplers with 40-59% RICA.   10. Obesity: h/o lap banding.  11. Diastolic CHF: Echo (5/13) with EF 60%, technically difficult study.  Echo (7/14): EF 60-65%.    Current Outpatient Prescriptions  Medication Sig Dispense Refill  . aspirin 325 MG EC tablet Take 325 mg by mouth daily.      Marland Kitchen atorvastatin (LIPITOR) 80 MG tablet Take 1 tablet (80 mg total) by mouth daily at 6 PM.  90 tablet  3  . lisinopril (PRINIVIL,ZESTRIL) 10 MG tablet Take 1 tablet (10 mg total) by mouth daily.  30 tablet  6  . loratadine (CLARITIN) 10 MG tablet Take 10 mg by mouth daily.      . metFORMIN (GLUCOPHAGE) 500 MG tablet Take 1 tablet by  mouth 2 (two) times daily.      . metoprolol (LOPRESSOR) 50 MG tablet Take 1 tablet (50 mg total) by mouth 2 (two) times daily.  180 tablet  3  . nitrofurantoin, macrocrystal-monohydrate, (MACROBID) 100 MG capsule Take 1 capsule by mouth daily.       No current facility-administered medications for this visit.    Allergies: Allergies  Allergen Reactions  . Vicodin [Hydrocodone-Acetaminophen] Other (See Comments)    syncope  . Sulfa Antibiotics Other (See Comments)    Unknown; childhood allergy   SH: Nonsmoker.  Lives with husband in [place].   FH: CAD.    ROS:  Please see the history of present illness.   All other systems reviewed and negative.   PHYSICAL EXAM: BP 122/62  Pulse 81  Ht 5' 5.5" (1.664 m)  Wt 120.657 kg (266 lb)  BMI 43.58 kg/m2  SpO2 97% General: NAD, obese Neck: Thick, no JVD, no thyromegaly or thyroid nodule.  Lungs: Clear to auscultation bilaterally with normal respiratory effort. CV: Nondisplaced PMI.  Heart regular S1/S2, no S3/S4, 1/6 SEM.  Trace ankle edema.  Left carotid bruit.  Normal pedal pulses.  Abdomen: Soft, nontender, no hepatosplenomegaly, no distention.  Neurologic: Alert and oriented x 3.  Psych: Normal affect. Extremities: No clubbing or cyanosis.   Assessment/Plan:  CAD  Stable s/p CABG. Continue  ASA, statin, ACEI, metoprolol. No ischemic symptoms.  CHF  H/o diastolic CHF. She gets fatigued but has only mild exertional dyspnea and does not appear volume overloaded.  Recent echo showed preserved EF and BNP was not elevated.  Hyperlipidemia Continue atorvastatin 80, will get copy of recent lipids from PCP.  Carotid stenosis Need repeat carotid dopplers in 5/15.  Hypertension Good BP today.  Continue current lisinopril and metoprolol. Fatigue I suspect that this is primarily due to obesity and deconditioning.  She needs to start an exercise regimen.  She is thinking about joining the [facility] to swim.    [name] 03/26/2013

## 2013-04-12 ENCOUNTER — Other Ambulatory Visit: Payer: Self-pay | Admitting: Cardiology

## 2013-05-09 NOTE — Progress Notes (Signed)
This encounter was created in error - please disregard.

## 2013-05-12 ENCOUNTER — Telehealth: Payer: Self-pay | Admitting: Cardiology

## 2013-05-12 MED ORDER — LISINOPRIL 10 MG PO TABS: ORAL_TABLET | ORAL | Status: DC

## 2013-05-12 NOTE — Telephone Encounter (Signed)
New Problem  Pt has no refills of lisinopril and is asking if she should continue// please advise.

## 2013-05-12 NOTE — Telephone Encounter (Signed)
Pt advised she should continue lisinopril

## 2013-08-09 ENCOUNTER — Other Ambulatory Visit (HOSPITAL_BASED_OUTPATIENT_CLINIC_OR_DEPARTMENT_OTHER): Payer: Medicare Other

## 2013-08-09 DIAGNOSIS — C50912 Malignant neoplasm of unspecified site of left female breast: Secondary | ICD-10-CM

## 2013-08-09 DIAGNOSIS — C50419 Malignant neoplasm of upper-outer quadrant of unspecified female breast: Secondary | ICD-10-CM

## 2013-08-09 LAB — COMPREHENSIVE METABOLIC PANEL (CC13)
ALT: 26 U/L (ref 0–55)
ANION GAP: 12 meq/L — AB (ref 3–11)
AST: 22 U/L (ref 5–34)
Albumin: 3.7 g/dL (ref 3.5–5.0)
Alkaline Phosphatase: 90 U/L (ref 40–150)
BILIRUBIN TOTAL: 0.55 mg/dL (ref 0.20–1.20)
BUN: 21 mg/dL (ref 7.0–26.0)
CALCIUM: 10.1 mg/dL (ref 8.4–10.4)
CHLORIDE: 104 meq/L (ref 98–109)
CO2: 24 meq/L (ref 22–29)
Creatinine: 1 mg/dL (ref 0.6–1.1)
GLUCOSE: 171 mg/dL — AB (ref 70–140)
Potassium: 4.2 mEq/L (ref 3.5–5.1)
SODIUM: 140 meq/L (ref 136–145)
TOTAL PROTEIN: 7.5 g/dL (ref 6.4–8.3)

## 2013-08-09 LAB — CBC WITH DIFFERENTIAL/PLATELET
BASO%: 0.5 % (ref 0.0–2.0)
Basophils Absolute: 0 10*3/uL (ref 0.0–0.1)
EOS ABS: 0.1 10*3/uL (ref 0.0–0.5)
EOS%: 2 % (ref 0.0–7.0)
HEMATOCRIT: 42.4 % (ref 34.8–46.6)
HGB: 14 g/dL (ref 11.6–15.9)
LYMPH#: 1.6 10*3/uL (ref 0.9–3.3)
LYMPH%: 24.3 % (ref 14.0–49.7)
MCH: 29.3 pg (ref 25.1–34.0)
MCHC: 33 g/dL (ref 31.5–36.0)
MCV: 88.9 fL (ref 79.5–101.0)
MONO#: 0.5 10*3/uL (ref 0.1–0.9)
MONO%: 8 % (ref 0.0–14.0)
NEUT%: 65.2 % (ref 38.4–76.8)
NEUTROS ABS: 4.2 10*3/uL (ref 1.5–6.5)
PLATELETS: 188 10*3/uL (ref 145–400)
RBC: 4.77 10*6/uL (ref 3.70–5.45)
RDW: 13.4 % (ref 11.2–14.5)
WBC: 6.5 10*3/uL (ref 3.9–10.3)

## 2013-08-16 ENCOUNTER — Encounter: Payer: Medicare Other | Admitting: Oncology

## 2013-08-16 ENCOUNTER — Encounter: Payer: Self-pay | Admitting: Physician Assistant

## 2013-08-16 ENCOUNTER — Other Ambulatory Visit: Payer: Self-pay | Admitting: Physician Assistant

## 2013-08-16 ENCOUNTER — Telehealth: Payer: Self-pay | Admitting: Oncology

## 2013-08-16 ENCOUNTER — Ambulatory Visit (HOSPITAL_BASED_OUTPATIENT_CLINIC_OR_DEPARTMENT_OTHER): Payer: Medicare Other | Admitting: Physician Assistant

## 2013-08-16 VITALS — BP 166/71 | HR 70 | Temp 97.8°F | Resp 18 | Ht 65.0 in | Wt 260.7 lb

## 2013-08-16 DIAGNOSIS — C50912 Malignant neoplasm of unspecified site of left female breast: Secondary | ICD-10-CM

## 2013-08-16 DIAGNOSIS — Z9013 Acquired absence of bilateral breasts and nipples: Secondary | ICD-10-CM

## 2013-08-16 DIAGNOSIS — E119 Type 2 diabetes mellitus without complications: Secondary | ICD-10-CM | POA: Insufficient documentation

## 2013-08-16 DIAGNOSIS — C50919 Malignant neoplasm of unspecified site of unspecified female breast: Secondary | ICD-10-CM

## 2013-08-16 DIAGNOSIS — Z170 Estrogen receptor positive status [ER+]: Secondary | ICD-10-CM

## 2013-08-16 MED ORDER — ANASTROZOLE 1 MG PO TABS
1.0000 mg | ORAL_TABLET | Freq: Every day | ORAL | Status: DC
Start: 2013-08-16 — End: 2014-10-26

## 2013-08-16 NOTE — Progress Notes (Signed)
ID: Jamie [name]   DOB: 03/28/1946  MR#: [id number]  CSN#:[id number]   PCP: Jamie Carmine., MD GYN: SURGAutumn Messing, MD;  Jamie Kos, DO OTHER:  Jamie Champagne, MD;  Jamie Mead, MD;  Jamie Morin, MD  CHIEF COMPLAINT:  Hx Left  Breast Cancer   HISTORY OF PRESENT ILLNESS: The patient noted a mass in her left breast and brought it to the attention of her primary physician, Jamie [name], who set her up for diagnostic mammography. This showed a mass of about 3 cm in diameter (I do not have a copy of that study or comparison with prior mammography from July of 2012). The patient was referred to Jamie [name] and he set her up for biopsy of the mass 08/28/2011 [facility]. The pathology showed [id number]) an invasive ductal carcinoma, grade 2, estrogen receptor 100% positive, progesterone receptor 96% positive, with an MIB-1 of 30%, and HER-2 equivocal with the ratio by CISH of 1.81.  Diagnostic right mammography was then performed 08/28/2011. Multiple masses were noted, and there were multiple soft tissue nodules by palpation. Ultrasound specifically showed 2 hypoechoic round masses measuring 1.3 and 0.9 cm respectively. 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 With this information and after extensive discussion the patient opted for bilateral mastectomies with left sentinel lymph node sampling, and this was performed 10/18/2011, with results and further treatment as discussed below.  INTERVAL HISTORY: Jamie [name] returns today accompanied by her husband, Jamie [name], for followup of her left breast carcinoma.  She continues on anastrozole which she is tolerating well, with no hot flashes, no increased vaginal dryness, and no worsening joint pain. She continues to have some mild fatigue. She has some seasonal sinus congestion and runny nose. She has some shortness of breath with exertion which is stable. She has a history of frequent  urinary tract infections. Currently, she has no dysuria or hematuria.  Currently, she is feeling well, however, and has had no recent illnesses, and no fevers, chills, or night sweats.   REVIEW OF SYSTEMS: Jamie [name] has had no rashes or skin changes and denies any abnormal bleeding. She has a good appetite and denies any nausea , emesis, or change in bowel habits. She denies any palpitations or chest pain.  She has had no abnormal headaches or dizziness. She has no increased pain, no unusual myalgias, arthralgias, or bony pain and denies any  increased peripheral swelling.  A detailed review of systems is otherwise stable and noncontributory.  PAST MEDICAL HISTORY: Past Medical History  Diagnosis Date  . Asthma   . COPD (chronic obstructive pulmonary disease)   . UTI (lower urinary tract infection)     freq-bladder implant -removed  . Hx of migraines   . Breast cancer 08/2011  . CAD (coronary artery disease)     NSTEMI 5/13:  LHC demonstrated oLM 75%, pLAD 80%, RCA occluded, filled by collaterals from the LAD.  Echo 12/10/11: EF 60%.; CABG 12/13/11: LIMA-LAD, SVG-RCA, SVG-diagonal and OM.   Marland Kitchen HLD (hyperlipidemia)   . DM2 (diabetes mellitus, type 2)   . Carotid stenosis     Pre-CABG Dopplers 5/13: Bilateral 40-59%  . Hypertension   . Myocardial infarction   . Obesity   . Sleep apnea     sleep study2-3 yrs ago lost weight afterward but gained back  hx tobacco abuse, 80 P/Y, resolved  PAST SURGICAL HISTORY: Past Surgical History  Procedure Laterality Date  . Cesarean  section  04/1976  . Abdominal hysterectomy  1977  . Laparoscopic gastric banding  2010  . Nasal sinus surgery    . Tonsillectomy    . Appendectomy    . Mastectomy w/ sentinel node biopsy  10/18/2011    Procedure: MASTECTOMY WITH SENTINEL LYMPH NODE BIOPSY;  Surgeon: Jamie Roof, MD;  Location: [place];  Service: General;  Laterality: Bilateral;  bilateral mastectomy and left sentinel node biopsy  . Axillary lymph node  dissection  10/18/2011    Procedure: AXILLARY LYMPH NODE DISSECTION;  Surgeon: Jamie Roof, MD;  Location: [place];  Service: General;  Laterality: Left;  . Breast reconstruction  10/18/2011    Procedure: BREAST RECONSTRUCTION;  Surgeon: Jamie Kos, DO;  Location: [place];  Service: Plastics;  Laterality: Bilateral;   bilateral breast reconstruction with bilateral tissue expander and placement of flex hd.  . US echocardiography      at [place]  . Portacath placement  12/02/2011    Procedure: INSERTION PORT-A-CATH;  Surgeon: Jamie Roof, MD;  Location: [place];  Service: General;  Laterality: Right;  . Coronary artery bypass graft  12/13/2011    Procedure: CORONARY ARTERY BYPASS GRAFTING (CABG);  Surgeon: Jamie Pollack, MD;  Location: [place];  Service: Open Heart Surgery;  Laterality: N/A;  Times four using endoscopically harvested left greater saphenous vein and left internal mammary artery. Right greater saphenous vein attempted; not appropriate for vein harvest.  . Lesion removal  04/09/2012    Procedure: LESION REMOVAL;  Surgeon: Jamie Kos, DO;  Location: [place];  Service: Plastics;  Laterality: Bilateral;  . Port-a-cath removal  04/09/2012    Procedure: REMOVAL PORT-A-CATH;  Surgeon: Jamie Roof, MD;  Location: [place];  Service: General;  Laterality: Right;  no salpingo-oophorectomy  FAMILY HISTORY Family History  Problem Relation Age of Onset  . Anesthesia problems Neg Hx   . Hypotension Neg Hx   . Malignant hyperthermia Neg Hx   . Pseudochol deficiency Neg Hx   . Heart disease Mother   . Breast cancer Maternal Aunt   . Ovarian cancer Paternal Aunt   . Breast cancer Maternal Aunt   . Heart disease Father   The patient's father died at the age of 66. He had a history of asbestos exposure. The patient's mother died at the age of 53 from heart disease. The patient had one brother, no sisters. There is no breast or ovarian cancer in the  immediate family but 2 of the patient's 5 maternal aunts had breast cancer (she does not know the age of diagnosis), and one of her father's sisters had ovarian cancer.  GYNECOLOGIC HISTORY: She had menarche age 51, no firm date for her menopause as she had hysterectomy remotely. She is GX P2, with a pair of twins and a daughter as her biological children as noted below. She never took hormone replacement.  SOCIAL HISTORY:  (Updated January 2015) She owns a tuxedo shop. Her husband of 34 years, Jamie [name], is retired but used to own a Higher education careers adviser. He is currently working in [place] with one of his own sons. At home in addition to the patient and her husband is one of the patient's daughters and her 2 children, as well as one of Jamie [name] sons. Altogether they have 6 children and 15 grandchildren and 1 great granddaughter. The patient attends [employer]    ADVANCED DIRECTIVES: Not in place.  They  were given a packet of information today.  HEALTH MAINTENANCE:  (Updated January 2015) History  Substance Use Topics  . Smoking status: Former Smoker -- 2.00 packs/day for 40 years    Types: Cigarettes    Quit date: 08/25/2006  . Smokeless tobacco: Never Used  . Alcohol Use: Yes     Comment: 1-2 times a year     Colonoscopy: 2012  PAP: s/p hysterectomy  Bone density:  06/05/2012, Normal  Lipid panel:  August 2013, Dr. Harrell Lark   Allergies  Allergen Reactions  . Vicodin [Hydrocodone-Acetaminophen] Other (See Comments)    syncope  . Sulfa Antibiotics Other (See Comments)    Unknown; childhood allergy    Current Outpatient Prescriptions  Medication Sig Dispense Refill  . anastrozole (ARIMIDEX) 1 MG tablet Take 1 tablet (1 mg total) by mouth daily.  90 tablet  3  . aspirin 325 MG EC tablet Take 325 mg by mouth daily.      Marland Kitchen atorvastatin (LIPITOR) 80 MG tablet Take 1 tablet (80 mg total) by mouth daily at 6 PM.  90 tablet  3  . buPROPion (WELLBUTRIN XL) 150 MG 24 hr tablet       . fluticasone  (FLONASE) 50 MCG/ACT nasal spray       . lisinopril (PRINIVIL,ZESTRIL) 10 MG tablet TAKE ONE TABLET BY MOUTH EVERY DAY  90 tablet  1  . loratadine (CLARITIN) 10 MG tablet Take 10 mg by mouth daily.      . metFORMIN (GLUCOPHAGE) 500 MG tablet Take 1 tablet by mouth 2 (two) times daily.      . metoprolol (LOPRESSOR) 50 MG tablet Take 1 tablet (50 mg total) by mouth 2 (two) times daily.  180 tablet  3  . nitrofurantoin, macrocrystal-monohydrate, (MACROBID) 100 MG capsule Take 1 capsule by mouth daily.      . trospium (SANCTURA) 20 MG tablet        No current facility-administered medications for this visit.    OBJECTIVE: Middle-aged white [name] who appears  comfortable and  is in no acute distress Filed Vitals:   08/16/13 1331  BP: 166/71  Pulse: 70  Temp: 97.8 F (36.6 C)  Resp: 18     Body mass index is 43.38 kg/(m^2).    ECOG FS: 2  Filed Weights   08/16/13 1331  Weight: 260 lb 11.2 oz (118.253 kg)   Physical Exam: HEENT:  Sclerae anicteric.  Oropharynx clear and moist.  Neck supple. Trachea midline.    Nodes:  No cervical, supraclavicular, or axillary lymphadenopathy palpated.  Breast Exam:  Status post bilateral mastectomies with implant reconstruction.  no suspicious nodularities or skin changes. There is no evidence of local recurrence. Lungs:  Clear to auscultation bilaterally With good excursion.  No crackles, rhonchi, or wheezes.   Heart:  Regular rate and rhythm. No murmur appreciated  Abdomen:  Soft, obese, nontender.  Positive bowel sounds.   Musculoskeletal:  No focal spinal tenderness to palpation.Good range of motion bilaterally in the upper extremities.  Extremities: No  peripheral edema  Neuro:  Nonfocal. Well oriented with friendly affect    LAB RESULTS:   Lab Results  Component Value Date   WBC 6.5 08/09/2013   NEUTROABS 4.2 08/09/2013   HGB 14.0 08/09/2013   HCT 42.4 08/09/2013   MCV 88.9 08/09/2013   PLT 188 08/09/2013      Chemistry      Component Value  Date/Time   NA 140 08/09/2013 1011   NA 139 07/27/2012  0931   K 4.2 08/09/2013 1011   K 3.8 07/27/2012 0931   CL 105 07/27/2012 0931   CO2 24 08/09/2013 1011   CO2 25 07/27/2012 0931   BUN 21.0 08/09/2013 1011   BUN 14 07/27/2012 0931   CREATININE 1.0 08/09/2013 1011   CREATININE 0.7 07/27/2012 0931      Component Value Date/Time   CALCIUM 10.1 08/09/2013 1011   CALCIUM 9.1 07/27/2012 0931   ALKPHOS 90 08/09/2013 1011   ALKPHOS 76 02/24/2012 1327   AST 22 08/09/2013 1011   AST 36 02/24/2012 1327   ALT 26 08/09/2013 1011   ALT 36* 02/24/2012 1327   BILITOT 0.55 08/09/2013 1011   BILITOT 0.4 02/24/2012 1327       STUDIES: Most recent bone density on 06/05/2012 was normal.      ASSESSMENT: 68 year-old Jamie [name]   (1)  s/p bilateral mastectomies and left axillary lymph node dissection 10/18/2011 for a left-sided pT2 pN1, stage IIB invasive ductal carcinoma, grade 2, estrogen receptor 100% and progesterone receptor 96% positive, with no HER-2 amplification (1.81, repeat 1.35), Mib-1 of 30%. The right breast showed no malignancy.  (2) Plan was to treat in the adjuvant setting with 4 dose dense cycles of doxorubicin/cyclophosphamide followed by 4 dose dense cycles of paclitaxel. Doxorubicin given by continuous infusion over 72 hours, with Neulasta given at the time of pump removal. Patient received a truncated cycle 1, the continuous infusion disconnected after proximally 6 hours when the patient was admitted to the hospital with unstable angina.  (3)  During hospitalization, CT angio negative for PE, echo showing well-preserved EF, enzymes/ECG showing a NSTEMI, s/p cath May 7 showing left main, LAD and RCA disease with a well-preserved ejection fraction, revascularization performed 12/13/2011 with an uneventful postoperative course  (4) the patient opted against further chemotherapy and also against post-mastectomy irradiation; she started anastrozole May 2013   PLAN:  Jamie [name]  is tolerating the  anastrozole well, and we are making no changes to her current regimen. The plan will be to continue this drug for total of 5 years, until may of 2018, then reassess.  She is coming up on HER-2 year anniversary from definitive surgery in March. Per Dr. Virgie [name] suggestion, we will restage Jamie [name] at that time with a chest CT and PET scan. She will return seen thereafter to followup with Dr. Jana [name] and review her long-term care plan.  All this was reviewed in detail with Jameelah and her husband today, both of whom voice understanding and agreement with this plan. As always, they know to call with any changes or problems prior to her next scheduled appointment.   Lukis Bunt PA-C    08/16/2013

## 2013-08-16 NOTE — Telephone Encounter (Signed)
, °

## 2013-10-28 ENCOUNTER — Encounter (HOSPITAL_COMMUNITY): Payer: Self-pay

## 2013-10-28 ENCOUNTER — Ambulatory Visit (HOSPITAL_COMMUNITY)
Admission: RE | Admit: 2013-10-28 | Discharge: 2013-10-28 | Disposition: A | Discharge status: Home / Self Care | Payer: Medicare Other | Source: Ambulatory Visit | Attending: Physician Assistant | Admitting: Physician Assistant

## 2013-10-28 ENCOUNTER — Encounter (HOSPITAL_COMMUNITY)
Admission: RE | Admit: 2013-10-28 | Discharge: 2013-10-28 | Disposition: A | Discharge status: Home / Self Care | Payer: Medicare Other | Source: Ambulatory Visit | Attending: Physician Assistant | Admitting: Physician Assistant

## 2013-10-28 ENCOUNTER — Other Ambulatory Visit (HOSPITAL_BASED_OUTPATIENT_CLINIC_OR_DEPARTMENT_OTHER): Payer: Medicare Other

## 2013-10-28 DIAGNOSIS — E119 Type 2 diabetes mellitus without complications: Secondary | ICD-10-CM

## 2013-10-28 DIAGNOSIS — R918 Other nonspecific abnormal finding of lung field: Secondary | ICD-10-CM | POA: Insufficient documentation

## 2013-10-28 DIAGNOSIS — Z9884 Bariatric surgery status: Secondary | ICD-10-CM | POA: Insufficient documentation

## 2013-10-28 DIAGNOSIS — Z853 Personal history of malignant neoplasm of breast: Secondary | ICD-10-CM | POA: Insufficient documentation

## 2013-10-28 DIAGNOSIS — C50419 Malignant neoplasm of upper-outer quadrant of unspecified female breast: Secondary | ICD-10-CM

## 2013-10-28 DIAGNOSIS — J984 Other disorders of lung: Secondary | ICD-10-CM | POA: Insufficient documentation

## 2013-10-28 DIAGNOSIS — Z9221 Personal history of antineoplastic chemotherapy: Secondary | ICD-10-CM | POA: Insufficient documentation

## 2013-10-28 DIAGNOSIS — C50912 Malignant neoplasm of unspecified site of left female breast: Secondary | ICD-10-CM

## 2013-10-28 DIAGNOSIS — C50919 Malignant neoplasm of unspecified site of unspecified female breast: Secondary | ICD-10-CM | POA: Insufficient documentation

## 2013-10-28 DIAGNOSIS — Z9010 Acquired absence of unspecified breast and nipple: Secondary | ICD-10-CM | POA: Insufficient documentation

## 2013-10-28 DIAGNOSIS — Z170 Estrogen receptor positive status [ER+]: Secondary | ICD-10-CM | POA: Insufficient documentation

## 2013-10-28 LAB — COMPREHENSIVE METABOLIC PANEL (CC13)
ALBUMIN: 3.7 g/dL (ref 3.5–5.0)
ALT: 22 U/L (ref 0–55)
ANION GAP: 14 meq/L — AB (ref 3–11)
AST: 22 U/L (ref 5–34)
Alkaline Phosphatase: 90 U/L (ref 40–150)
BUN: 16.1 mg/dL (ref 7.0–26.0)
CALCIUM: 9.8 mg/dL (ref 8.4–10.4)
CHLORIDE: 108 meq/L (ref 98–109)
CO2: 21 meq/L — AB (ref 22–29)
Creatinine: 0.8 mg/dL (ref 0.6–1.1)
Glucose: 101 mg/dl (ref 70–140)
POTASSIUM: 4.1 meq/L (ref 3.5–5.1)
SODIUM: 143 meq/L (ref 136–145)
TOTAL PROTEIN: 7.2 g/dL (ref 6.4–8.3)
Total Bilirubin: 0.58 mg/dL (ref 0.20–1.20)

## 2013-10-28 LAB — CBC WITH DIFFERENTIAL/PLATELET
BASO%: 0.7 % (ref 0.0–2.0)
Basophils Absolute: 0 10*3/uL (ref 0.0–0.1)
EOS%: 2.5 % (ref 0.0–7.0)
Eosinophils Absolute: 0.2 10*3/uL (ref 0.0–0.5)
HEMATOCRIT: 41.2 % (ref 34.8–46.6)
HGB: 13.3 g/dL (ref 11.6–15.9)
LYMPH#: 2 10*3/uL (ref 0.9–3.3)
LYMPH%: 29.6 % (ref 14.0–49.7)
MCH: 28.8 pg (ref 25.1–34.0)
MCHC: 32.3 g/dL (ref 31.5–36.0)
MCV: 89.2 fL (ref 79.5–101.0)
MONO#: 0.5 10*3/uL (ref 0.1–0.9)
MONO%: 6.8 % (ref 0.0–14.0)
NEUT#: 4 10*3/uL (ref 1.5–6.5)
NEUT%: 60.4 % (ref 38.4–76.8)
Platelets: 162 10*3/uL (ref 145–400)
RBC: 4.62 10*6/uL (ref 3.70–5.45)
RDW: 13.7 % (ref 11.2–14.5)
WBC: 6.7 10*3/uL (ref 3.9–10.3)

## 2013-10-28 LAB — GLUCOSE, CAPILLARY: Glucose-Capillary: 95 mg/dL (ref 70–99)

## 2013-10-28 MED ORDER — IOHEXOL 300 MG/ML  SOLN
80.0000 mL | Freq: Once | INTRAMUSCULAR | Status: AC | PRN
  Administered 2013-10-28: 80 mL via INTRAVENOUS

## 2013-10-28 MED ORDER — FLUDEOXYGLUCOSE F - 18 (FDG) INJECTION
13.3000 | Freq: Once | INTRAVENOUS | Status: AC | PRN
  Administered 2013-10-28: 13.3 via INTRAVENOUS

## 2013-11-04 ENCOUNTER — Ambulatory Visit (INDEPENDENT_AMBULATORY_CARE_PROVIDER_SITE_OTHER): Payer: Medicare Other | Admitting: Internal Medicine

## 2013-11-04 ENCOUNTER — Encounter: Payer: Self-pay | Admitting: Internal Medicine

## 2013-11-04 ENCOUNTER — Ambulatory Visit (HOSPITAL_BASED_OUTPATIENT_CLINIC_OR_DEPARTMENT_OTHER): Payer: Medicare Other | Admitting: Oncology

## 2013-11-04 ENCOUNTER — Telehealth: Payer: Self-pay | Admitting: Oncology

## 2013-11-04 VITALS — BP 184/82 | HR 76 | Temp 98.1°F | Resp 20 | Ht 65.0 in | Wt 258.1 lb

## 2013-11-04 VITALS — BP 160/84 | HR 63 | Ht 65.5 in | Wt 259.6 lb

## 2013-11-04 DIAGNOSIS — Z170 Estrogen receptor positive status [ER+]: Secondary | ICD-10-CM

## 2013-11-04 DIAGNOSIS — C50419 Malignant neoplasm of upper-outer quadrant of unspecified female breast: Secondary | ICD-10-CM

## 2013-11-04 DIAGNOSIS — I2510 Atherosclerotic heart disease of native coronary artery without angina pectoris: Secondary | ICD-10-CM

## 2013-11-04 DIAGNOSIS — J209 Acute bronchitis, unspecified: Secondary | ICD-10-CM

## 2013-11-04 DIAGNOSIS — K5900 Constipation, unspecified: Secondary | ICD-10-CM

## 2013-11-04 DIAGNOSIS — N63 Unspecified lump in unspecified breast: Secondary | ICD-10-CM

## 2013-11-04 DIAGNOSIS — R222 Localized swelling, mass and lump, trunk: Secondary | ICD-10-CM

## 2013-11-04 DIAGNOSIS — I6529 Occlusion and stenosis of unspecified carotid artery: Secondary | ICD-10-CM

## 2013-11-04 DIAGNOSIS — R918 Other nonspecific abnormal finding of lung field: Secondary | ICD-10-CM

## 2013-11-04 DIAGNOSIS — N6320 Unspecified lump in the left breast, unspecified quadrant: Secondary | ICD-10-CM

## 2013-11-04 DIAGNOSIS — C50919 Malignant neoplasm of unspecified site of unspecified female breast: Secondary | ICD-10-CM

## 2013-11-04 DIAGNOSIS — Z9013 Acquired absence of bilateral breasts and nipples: Secondary | ICD-10-CM

## 2013-11-04 MED ORDER — LEVOFLOXACIN 500 MG PO TABS: 500.0000 mg | ORAL_TABLET | Freq: Every day | ORAL | Status: DC

## 2013-11-04 NOTE — Patient Instructions (Signed)
#  Left upper lobe lung mass/nodule  - I will talk to Dr Londell Moh my colleague about setting up navigational bronchoscopy -My CMA wil work on getting a special disc made of the 10/28/13 CT if not you might have to another CT chest  For bronchitis  -  take levaquin 500mg  once daily  X 5 days

## 2013-11-04 NOTE — Progress Notes (Signed)
Subjective:    Patient ID: Jamie [name], female    DOB: 12-Feb-1946, 68 y.o.   MRN: [id number]  HPI   PCP- Nira Conn Spry  69 year old ex-smoker presents for evaluation of excessive daytime fatigue and dyspnea. She underwent bilateral mastectomy for left breast cancer. This did involve a lymph node, unfortunately her first chemotherapy was complicated by an acute myocardial infarction. She subsequently required CABG in June 2013. Preop echo showed normal ejection fraction and she did recover but never fully he came her activity level. She was unable to participate in rehabilitation and instead chose to use her daughter swimming pool for exercise. Had LAP-BAND surgery in 2009 and her weight decreased from 280-2-30 pounds, but she has regained to 265 now. Prior to lap band surgery, she had PFTs that showed moderate restriction with FVC of 66%, FEV1 of 64% and a ratio 76 with DLCO 56%.  Spirometry today shows unchanged prominent function with FEV1 of 67% and FVC of 71% with a ratio 73, again no evidence of airway obstruction. She did smoke about 80 pack years before she quit in 2009.  Overnight polysomnogram in November 2009 showed mild obstructive sleep apnea with AHI of 11 events per hour with severe oxygen desaturation to 67%.  She spent 80 minutes with a saturation less than 88%. 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 She did not desaturate on walking   OV 11/04/2013 Chief Complaint  Patient presents with  . Acute Visit    Dr.Alva pt, last OV 01-2013 for abnormal CT and pet scan. Pt states she is having a productive cough, chest congestion, and SOB.       Referred by Dr. Jana Hakim for evaluation of left upper lobe mass  She is morbidly obese. She is ex heavy smoker 80 pack history quit 7 years ago. She is a diagnosis of breast cancer on the left breast from a few years ago. She status post bilateral mastectomies for the  same. First chemotherapy was compensated by my card he'll infarction for which he underwent quadruple bypass according to her history. Since then she's had no more chemotherapy and is in complete remission.  From a respiratory standpoint she is essentially asymptomatic. CT scan of the chest in May 2013 showed some mild left upper lobe platelike atelectasis. Currently on followup CT scan of the chest 10/28/2013 this is much enlarged and is active on PET scan which is 4 cm craniocaudal dimension and 1 cm answers. It is active on PET scan. There no other PET scan uptake areas. It appears as an airway leading up to this mass.  Of note, the last 1 or 2 days she's had bronchitis symptoms with cough and congestion. This is after she got exposed to her grandson who is sick with respiratory infection. She's asking for antibiotics for the same   CT chest 10/28/13  COMPARISON: NM PET IMAGE RESTAG (PS) SKULL BASE TO THIGH dated  10/28/2013; CT CHEST W/CM dated 09/10/2011; CT ANGIO CHEST W/CM &/OR  WO/CM dated 12/10/2011  FINDINGS:  Bilateral mastectomies with a submuscular implants. No axillary  adenopathy. No supraclavicular or internal mammary adenopathy. No  mediastinal adenopathy.  Within the left upper lobe there is a new elongated focus of  consolidation which measures 13 by 10 mm in axial dimension (image  14, series 5 and 40 mm in craniocaudad dimension. On the comparison  exam from 12/10/2011 there is  a similar pattern albeit smaller and  more medial measuring 32 by 4 x 10 mm. . Airways are normal.  Review of the upper abdomen demonstrates normal adrenal glands.  There is diffuse low-attenuation in the liver. Gastric banding  device noted. No aggressive osseous lesion.  IMPRESSION:  1. The linear elongated focus of consolidation in the medial left  upper lobe is increased in size compared to prior exam. Due to  metabolic activity on comparison exam recommend either percutaneous  sampling or  bronchoscopy of for sampling of this atypical lesion.  2. No additional evidence of metastatic disease.  Electronically Signed  By: Stewart Edmunds M.D.  On: 10/28/2013 14:01    PET Scan 10/28/13  No focal hypermetabolic activity to suggest skeletal metastasis.  IMPRESSION:  1. Intense hypermetabolic activity associated with linear nodular  thickening in the left upper lobe. Although this is atypical  presentation for breast cancer metastasis, due to the intense  metabolic activity recommend either percutaneous sampling or  bronchoscopic sampling. Differential diagnosis would also included  inflammatory response or fungal infection.  2. Two additional foci of uptake while within the right upper lobe  and inferior lingula without clear measurable disease on the CT  portion. Within this pattern may suggest that the pulmonary  parenchymal findings are an inflammatory response or potentially a  fungal infection.  3. No additional evidence of metastatic disease on the whole-body  scan.  Electronically Signed  By: Stewart Edmunds M.D.  On: 10/28/2013 13:59  Past Medical History  Diagnosis Date  . Asthma   . COPD (chronic obstructive pulmonary disease)   . UTI (lower urinary tract infection)     freq-bladder implant -removed  . Hx of migraines   . CAD (coronary artery disease)     NSTEMI 5/13:  LHC demonstrated oLM 75%, pLAD 80%, RCA occluded, filled by collaterals from the LAD.  Echo 12/10/11: EF 60%.; CABG 12/13/11: LIMA-LAD, SVG-RCA, SVG-diagonal and OM.   . HLD (hyperlipidemia)   . Carotid stenosis     Pre-CABG Dopplers 5/13: Bilateral 40-59%  . Hypertension   . Myocardial infarction   . Obesity   . Sleep apnea     sleep study2-3 yrs ago lost weight afterward but gained back  . Breast cancer 08/2011    lt. breast ca  . DM2 (diabetes mellitus, type 2)     diet controlled     Family History  Problem Relation Age of Onset  . Anesthesia problems Neg Hx   . Hypotension Neg Hx    . Malignant hyperthermia Neg Hx   . Pseudochol deficiency Neg Hx   . Heart disease Mother   . Breast cancer Maternal Aunt   . Ovarian cancer Paternal Aunt   . Breast cancer Maternal Aunt   . Heart disease Father      History   Social History  . Marital Status: Married    Spouse Name: N/A    Number of Children: 6  . Years of Education: N/A   Occupational History  . retired    Social History Main Topics  . Smoking status: Former Smoker -- 2.00 packs/day for 40 years    Types: Cigarettes    Quit date: 08/25/2006  . Smokeless tobacco: Never Used  . Alcohol Use: Yes     Comment: 1-2 times a year  . Drug Use: No  . Sexual Activity: Not Currently   Other Topics Concern  . Not on file   Social History Narrative  . No   narrative on file     Allergies  Allergen Reactions  . Vicodin [Hydrocodone-Acetaminophen] Other (See Comments)    syncope  . Sulfa Antibiotics Other (See Comments)    Unknown; childhood allergy     Outpatient Prescriptions Prior to Visit  Medication Sig Dispense Refill  . anastrozole (ARIMIDEX) 1 MG tablet Take 1 tablet (1 mg total) by mouth daily.  90 tablet  3  . aspirin 325 MG EC tablet Take 325 mg by mouth daily.      Marland Kitchen atorvastatin (LIPITOR) 80 MG tablet Take 1 tablet (80 mg total) by mouth daily at 6 PM.  90 tablet  3  . buPROPion (WELLBUTRIN XL) 150 MG 24 hr tablet Take 300 mg by mouth daily.       . fluticasone (FLONASE) 50 MCG/ACT nasal spray Place 2 sprays into both nostrils daily.       Marland Kitchen lisinopril (PRINIVIL,ZESTRIL) 10 MG tablet TAKE ONE TABLET BY MOUTH EVERY DAY  90 tablet  1  . loratadine (CLARITIN) 10 MG tablet Take 10 mg by mouth daily.      . metFORMIN (GLUCOPHAGE) 500 MG tablet Take 1 tablet by mouth 2 (two) times daily.      . metoprolol (LOPRESSOR) 50 MG tablet Take 1 tablet (50 mg total) by mouth 2 (two) times daily.  180 tablet  3  . nitrofurantoin, macrocrystal-monohydrate, (MACROBID) 100 MG capsule Take 1 capsule by mouth  daily.      . trospium (SANCTURA) 20 MG tablet Take 20 mg by mouth at bedtime.        No facility-administered medications prior to visit.      Review of Systems  Constitutional: Negative for fever and unexpected weight change.  HENT: Negative for congestion, dental problem, ear pain, nosebleeds, postnasal drip, rhinorrhea, sinus pressure, sneezing, sore throat and trouble swallowing.   Eyes: Negative for redness and itching.  Respiratory: Positive for cough and shortness of breath. Negative for chest tightness and wheezing.   Cardiovascular: Negative for palpitations and leg swelling.  Gastrointestinal: Negative for nausea and vomiting.  Genitourinary: Negative for dysuria.  Musculoskeletal: Negative for joint swelling.  Skin: Negative for rash.  Neurological: Negative for headaches.  Hematological: Does not bruise/bleed easily.  Psychiatric/Behavioral: Negative for dysphoric mood. The patient is not nervous/anxious.        Objective:   Physical Exam  Vitals reviewed. Constitutional: She is oriented to person, place, and time. She appears well-developed and well-nourished. No distress.  Body mass index is 42.53 kg/(m^2). Looks flat affect Mask on Perioidic cough+  HENT:  Head: Normocephalic and atraumatic.  Right Ear: External ear normal.  Left Ear: External ear normal.  Mouth/Throat: Oropharynx is clear and moist. No oropharyngeal exudate.  Eyes: Conjunctivae and EOM are normal. Pupils are equal, round, and reactive to light. Right eye exhibits no discharge. Left eye exhibits no discharge. No scleral icterus.  Neck: Normal range of motion. Neck supple. No JVD present. No tracheal deviation present. No thyromegaly present.  Cardiovascular: Normal rate, regular rhythm, normal heart sounds and intact distal pulses.  Exam reveals no gallop and no friction rub.   No murmur heard. Pulmonary/Chest: Effort normal and breath sounds normal. No respiratory distress. She has no  wheezes. She has no rales. She exhibits no tenderness.  Abdominal: Soft. Bowel sounds are normal. She exhibits no distension and no mass. There is no tenderness. There is no rebound and no guarding.  Musculoskeletal: Normal range of motion. She exhibits no edema and no  tenderness.  Lymphadenopathy:    She has no cervical adenopathy.  Neurological: She is alert and oriented to person, place, and time. She has normal reflexes. No cranial nerve deficit. She exhibits normal muscle tone. Coordination normal.  Skin: Skin is warm and dry. No rash noted. She is not diaphoretic. No erythema. No pallor.  Psychiatric: She has a normal mood and affect. Her behavior is normal. Judgment and thought content normal.          Assessment & Plan:  #Left upper lobe lung mass/nodule  - I will talk to Dr Londell Moh my colleague about setting up navigational bronchoscopy -My CMA wil work on getting a special disc made of the 10/28/13 CT if not you might have to another CT chest  For bronchitis  -  take levaquin 500mg  once daily  X 5 day

## 2013-11-04 NOTE — Telephone Encounter (Signed)
m °

## 2013-11-04 NOTE — Progress Notes (Signed)
ID: Jamie [name]   DOB: 30-Dec-1945  MR#: [id number]  CSN#:[id number]   PCP: Verdell Carmine., MD GYN: SURGAutumn Messing, MD;  Theodoro Kos, DO OTHER:  Loralie Champagne, MD;  Lance Morin, MD, Baltazar Apo MD  CHIEF COMPLAINT:  "I know when I have bronchitis"   HISTORY OF PRESENT ILLNESS: The patient noted a mass in her left breast and brought it to the attention of her primary physician, Dr. Ward Givens, who set her up for diagnostic mammography. This showed a mass of about 3 cm in diameter (I do not have a copy of that study or comparison with prior mammography from July of 2012). The patient was referred to Dr. Marlou Starks and he set her up for biopsy of the mass 08/28/2011 [facility]. The pathology showed [phone] an invasive ductal carcinoma, grade 2, estrogen receptor 100% positive, progesterone receptor 96% positive, with an MIB-1 of 30%, and HER-2 equivocal with the ratio by CISH of 1.81.  Diagnostic right mammography was then performed 08/28/2011. Multiple masses were noted, and there were multiple soft tissue nodules by palpation. Ultrasound specifically showed 2 hypoechoic round masses measuring 1.3 and 0.9 cm respectively. 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 With this information and after extensive discussion the patient opted for bilateral mastectomies with left sentinel lymph node sampling, and this was performed 10/18/2011, with results and further treatment as discussed below.  INTERVAL HISTORY: Jamie [name] returns today accompanied by her husband, Jamie [name], for followup of her left breast carcinoma.  She is wearing a mask and tells me she is developed "bronchitis". She has called her primary care physician, Dr. Harrell Lark, and she tells me Dr. Harrell Lark usually puts her on antibiotics for that.  REVIEW OF SYSTEMS: Jamie [name] has had increased cough, and some phlegm production, but she has not looked at it so does not know the  color. She has a little bit of a sore throat. There have been no fevers. She tells me she is losing weight with an increased dose of Wellbutrin. She is not exercising regularly, although when the weather is good she takes little walks. She does a lot of shopping, she tells me. She has had a recent urinary tract infection. She feels that her left upper anterior chest is more "swollen" than the right. This dates back to her cardiac surgery. Otherwise a detailed review of systems today was noncontributory and in particular she is tolerating the anastrozole with no side effects that she is aware of  PAST MEDICAL HISTORY: Past Medical History  Diagnosis Date  . Asthma   . COPD (chronic obstructive pulmonary disease)   . UTI (lower urinary tract infection)     freq-bladder implant -removed  . Hx of migraines   . CAD (coronary artery disease)     NSTEMI 5/13:  LHC demonstrated oLM 75%, pLAD 80%, RCA occluded, filled by collaterals from the LAD.  Echo 12/10/11: EF 60%.; CABG 12/13/11: LIMA-LAD, SVG-RCA, SVG-diagonal and OM.   Marland Kitchen HLD (hyperlipidemia)   . Carotid stenosis     Pre-CABG Dopplers 5/13: Bilateral 40-59%  . Hypertension   . Myocardial infarction   . Obesity   . Sleep apnea     sleep study2-3 yrs ago lost weight afterward but gained back  . Breast cancer 08/2011    lt. breast ca  . DM2 (diabetes mellitus, type 2)     diet controlled  hx tobacco abuse, 80 P/Y, resolved  PAST  SURGICAL HISTORY: Past Surgical History  Procedure Laterality Date  . Cesarean section  04/1976  . Abdominal hysterectomy  1977  . Laparoscopic gastric banding  2010  . Nasal sinus surgery    . Tonsillectomy    . Appendectomy    . Mastectomy w/ sentinel node biopsy  10/18/2011    Procedure: MASTECTOMY WITH SENTINEL LYMPH NODE BIOPSY;  Surgeon: Merrie Roof, MD;  Location: [facility];  Service: General;  Laterality: Bilateral;  bilateral mastectomy and left sentinel node biopsy  . Axillary lymph node dissection   10/18/2011    Procedure: AXILLARY LYMPH NODE DISSECTION;  Surgeon: Merrie Roof, MD;  Location: [facility];  Service: General;  Laterality: Left;  . Breast reconstruction  10/18/2011    Procedure: BREAST RECONSTRUCTION;  Surgeon: Theodoro Kos, DO;  Location: [facility];  Service: Plastics;  Laterality: Bilateral;   bilateral breast reconstruction with bilateral tissue expander and placement of flex hd.  . US echocardiography      at [facility]  . Portacath placement  12/02/2011    Procedure: INSERTION PORT-A-CATH;  Surgeon: Merrie Roof, MD;  Location: [facility];  Service: General;  Laterality: Right;  . Coronary artery bypass graft  12/13/2011    Procedure: CORONARY ARTERY BYPASS GRAFTING (CABG);  Surgeon: Gaye Pollack, MD;  Location: [facility];  Service: Open Heart Surgery;  Laterality: N/A;  Times four using endoscopically harvested left greater saphenous vein and left internal mammary artery. Right greater saphenous vein attempted; not appropriate for vein harvest.  . Lesion removal  04/09/2012    Procedure: LESION REMOVAL;  Surgeon: Theodoro Kos, DO;  Location: [place];  Service: Plastics;  Laterality: Bilateral;  . Port-a-cath removal  04/09/2012    Procedure: REMOVAL PORT-A-CATH;  Surgeon: Merrie Roof, MD;  Location: [facility];  Service: General;  Laterality: Right;  no salpingo-oophorectomy  FAMILY HISTORY Family History  Problem Relation Age of Onset  . Anesthesia problems Neg Hx   . Hypotension Neg Hx   . Malignant hyperthermia Neg Hx   . Pseudochol deficiency Neg Hx   . Heart disease Mother   . Breast cancer Maternal Aunt   . Ovarian cancer Paternal Aunt   . Breast cancer Maternal Aunt   . Heart disease Father   The patient's father died at the age of 52. He had a history of asbestos exposure. The patient's mother died at the age of 17 from heart disease. The patient had one brother, no sisters. There is no breast or ovarian cancer in the immediate  family but 2 of the patient's 5 maternal aunts had breast cancer (she does not know the age of diagnosis), and one of her father's sisters had ovarian cancer.  GYNECOLOGIC HISTORY: She had menarche age 22, no firm date for her menopause as she had hysterectomy remotely. She is GX P2, with a pair of twins and a daughter as her biological children as noted below. She never took hormone replacement.  SOCIAL HISTORY:  (Updated January 2015) She owns a tuxedo shop. Her husband of 34 years, Jamie [name], is retired but used to own a Higher education careers adviser. He is currently working in [place] with one of his own sons. At home in addition to the patient and her husband is one of the patient's daughters and her 2 children, as well as one of Jamie [name] sons. Altogether they have 6 children and 15 grandchildren and 1 great granddaughter. The patient att[employer]    ADVANCED DIRECTIVES: Not in place.  They were given a packet of information today.  HEALTH MAINTENANCE:  (Updated January 2015) History  Substance Use Topics  . Smoking status: Former Smoker -- 2.00 packs/day for 40 years    Types: Cigarettes    Quit date: 08/25/2006  . Smokeless tobacco: Never Used  . Alcohol Use: Yes     Comment: 1-2 times a year     Colonoscopy: 2012  PAP: s/p hysterectomy  Bone density:  06/05/2012, Normal  Lipid panel:  August 2013, Dr. Harrell Lark   Allergies  Allergen Reactions  . Vicodin [Hydrocodone-Acetaminophen] Other (See Comments)    syncope  . Sulfa Antibiotics Other (See Comments)    Unknown; childhood allergy    Current Outpatient Prescriptions  Medication Sig Dispense Refill  . anastrozole (ARIMIDEX) 1 MG tablet Take 1 tablet (1 mg total) by mouth daily.  90 tablet  3  . aspirin 325 MG EC tablet Take 325 mg by mouth daily.      Marland Kitchen atorvastatin (LIPITOR) 80 MG tablet Take 1 tablet (80 mg total) by mouth daily at 6 PM.  90 tablet  3  . buPROPion (WELLBUTRIN XL) 150 MG 24 hr tablet       . fluticasone (FLONASE)  50 MCG/ACT nasal spray       . lisinopril (PRINIVIL,ZESTRIL) 10 MG tablet TAKE ONE TABLET BY MOUTH EVERY DAY  90 tablet  1  . loratadine (CLARITIN) 10 MG tablet Take 10 mg by mouth daily.      . metFORMIN (GLUCOPHAGE) 500 MG tablet Take 1 tablet by mouth 2 (two) times daily.      . metoprolol (LOPRESSOR) 50 MG tablet Take 1 tablet (50 mg total) by mouth 2 (two) times daily.  180 tablet  3  . nitrofurantoin, macrocrystal-monohydrate, (MACROBID) 100 MG capsule Take 1 capsule by mouth daily.      . trospium (SANCTURA) 20 MG tablet        No current facility-administered medications for this visit.    OBJECTIVE: Middle-aged white woman w wearing a mask Filed Vitals:   11/04/13 0942  BP: 184/82  Pulse: 76  Temp: 98.1 F (36.7 C)  Resp: 20     Body mass index is 42.95 kg/(m^2).    ECOG FS: 1  Filed Weights   11/04/13 0942  Weight: 258 lb 1.6 oz (117.073 kg)   Sclerae unicteric, pupils round and equal Oropharynx clear and moist-- no erythema or lesions noted No cervical or supraclavicular adenopathy Lungs no rales or rhonchi, fair excursion bilaterally Heart regular rate and rhythm, distant sounds Abd soft, obese, nontender, positive bowel sounds, no masses palpated MSK no focal spinal tenderness, no upper extremity lymphedema; the "swelling" she notices in her upper anterior chest is a little bit of asymmetry following her median sternotomy; there is no erythema tenderness or fluctuance Neuro: nonfocal, well oriented, appropriate affect Breasts: Status post bilateral mastectomies. There is no evidence of local recurrence. Both axillae are benign.    LAB RESULTS:   Lab Results  Component Value Date   WBC 6.7 10/28/2013   NEUTROABS 4.0 10/28/2013   HGB 13.3 10/28/2013   HCT 41.2 10/28/2013   MCV 89.2 10/28/2013   PLT 162 10/28/2013      Chemistry      Component Value Date/Time   NA 143 10/28/2013 0813   NA 139 07/27/2012 0931   K 4.1 10/28/2013 0813   K 3.8 07/27/2012 0931  CL 105 07/27/2012 0931   CO2 21* 10/28/2013 0813   CO2 25 07/27/2012 0931   BUN 16.1 10/28/2013 0813   BUN 14 07/27/2012 0931   CREATININE 0.8 10/28/2013 0813   CREATININE 0.7 07/27/2012 0931      Component Value Date/Time   CALCIUM 9.8 10/28/2013 0813   CALCIUM 9.1 07/27/2012 0931   ALKPHOS 90 10/28/2013 0813   ALKPHOS 76 02/24/2012 1327   AST 22 10/28/2013 0813   AST 36 02/24/2012 1327   ALT 22 10/28/2013 0813   ALT 36* 02/24/2012 1327   BILITOT 0.58 10/28/2013 0813   BILITOT 0.4 02/24/2012 1327       STUDIES: Ct Chest W Contrast  10/28/2013   CLINICAL DATA:  Left breast cancer diagnosed January 2013. Chemotherapy terminated after 1 cycle due to toxicity. . Bilateral mastectomies and left axillary lymph node dissection 10/18/2011 for a left-sided pT2 pN1, stage IIB invasive ductal carcinoma, grade 2, estrogen receptor 100% and progesterone receptor 96% positive, with no HER-2 amplification. The right breast showed no malignancy.  EXAM: CT CHEST WITH CONTRAST  TECHNIQUE: Multidetector CT imaging of the chest was performed during intravenous contrast administration.  CONTRAST:  97m OMNIPAQUE IOHEXOL 300 MG/ML  SOLN  COMPARISON:  NM PET IMAGE RESTAG (PS) SKULL BASE TO THIGH dated 10/28/2013; CT CHEST W/CM dated 09/10/2011; CT ANGIO CHEST W/CM &/OR WO/CM dated 12/10/2011  FINDINGS: Bilateral mastectomies with a submuscular implants. No axillary adenopathy. No supraclavicular or internal mammary adenopathy. No mediastinal adenopathy.  Within the left upper lobe there is a new elongated focus of consolidation which measures 13 by 10 mm in axial dimension (image 14, series 5 and 40 mm in craniocaudad dimension. On the comparison exam from 12/10/2011 there is a similar pattern albeit smaller and more medial measuring 32 by 4 x 10 mm. . Airways are normal.  Review of the upper abdomen demonstrates normal adrenal glands. There is diffuse low-attenuation in the liver. Gastric banding device noted. No aggressive  osseous lesion.  IMPRESSION: 1. The linear elongated focus of consolidation in the medial left upper lobe is increased in size compared to prior exam. Due to metabolic activity on comparison exam recommend either percutaneous sampling or bronchoscopy of for sampling of this atypical lesion. 2. No additional evidence of metastatic disease.   Electronically Signed   By: SSuzy BouchardM.D.   On: 10/28/2013 14:01   Nm Pet Image Restag (ps) Skull Base To Thigh  10/28/2013   CLINICAL DATA:  Subsequent treatment strategy for breast carcinoma. Bilateral mastectomies and left axillary lymph node dissection 10/18/2011 for a left-sided pT2 pN1, stage IIB invasive ductal carcinoma, grade 2, estrogen receptor 100% and progesterone receptor 96% positive, with no HER-2 amplification (1.81, repeat 1.35), Mib-1 of 30%. The right breast showed no malignancy.  EXAM: NUCLEAR MEDICINE PET SKULL BASE TO THIGH  TECHNIQUE: 13.3 mCi F-18 FDG was injected intravenously. Full-ring PET imaging was performed from the skull base to thigh after the radiotracer. CT data was obtained and used for attenuation correction and anatomic localization.  FASTING BLOOD GLUCOSE:  Value: 95 mg/dl  COMPARISON:  CT ABD-PELV W/ CM dated 05/19/2012  FINDINGS: NECK  No hypermetabolic lymph nodes in the neck.  CHEST  In left upper lobe there is increased linear nodular thickening extending from the left superior hilum to the left lung apex. Nodularity measures 12 x 15 mm in axial dimension extends over approximately 4 cm. This is increased in thickness compared to prior CT. The metabolic activity  is intense with SUV max of 15.1.  There is a focus of intense metabolic activity in the right upper lobe with out measurable disease on CT portion with SUV max 4.1. The third focus of hypermetabolic activity within the more inferior lingula (image 68) also without clear measurable disease on CT portion.  No hypermetabolic mediastinal lymph nodes. No hypermetabolic  axillary lymph nodes. Bilateral mastectomy anatomy noted.  ABDOMEN/PELVIS  No abnormal hypermetabolic activity within the liver, pancreas, adrenal glands, or spleen. No hypermetabolic lymph nodes in the abdomen or pelvis.  SKELETON  No focal hypermetabolic activity to suggest skeletal metastasis.  IMPRESSION: 1. Intense hypermetabolic activity associated with linear nodular thickening in the left upper lobe. Although this is atypical presentation for breast cancer metastasis, due to the intense metabolic activity recommend either percutaneous sampling or bronchoscopic sampling. Differential diagnosis would also included inflammatory response or fungal infection. 2. Two additional foci of uptake while within the right upper lobe and inferior lingula without clear measurable disease on the CT portion. Within this pattern may suggest that the pulmonary parenchymal findings are an inflammatory response or potentially a fungal infection. 3. No additional evidence of metastatic disease on the whole-body scan.   Electronically Signed   By: Suzy Bouchard M.D.   On: 10/28/2013 13:59        ASSESSMENT: 68 year-old [name] woman   (1)  s/p bilateral mastectomies and left axillary lymph node dissection 10/18/2011 for a left-sided pT2 pN1, stage IIB invasive ductal carcinoma, grade 2, estrogen receptor 100% and progesterone receptor 96% positive, with no HER-2 amplification (1.81, repeat 1.35), Mib-1 of 30%. The right breast showed no malignancy.  (2) Plan was to treat in the adjuvant setting with 4 dose dense cycles of doxorubicin/cyclophosphamide followed by 4 dose dense cycles of paclitaxel. Doxorubicin given by continuous infusion over 72 hours, with Neulasta given at the time of pump removal. Patient received a truncated cycle 1, the continuous infusion disconnected after proximally 6 hours when the patient was admitted to the hospital with unstable angina.  (3)  During hospitalization, CT angio negative  for PE, echo showing well-preserved EF, enzymes/ECG showing a NSTEMI, s/p cath May 7 showing left main, LAD and RCA disease with a well-preserved ejection fraction, revascularization performed 12/13/2011 with an uneventful postoperative course  (4) the patient opted against further chemotherapy and also against post-mastectomy irradiation; she started anastrozole May 2013   PLAN:  Arlissa  is t doing fine from a breast cancer point of view and I don't think what we are seeing in her lung is going to be breast cancer. She is going to need I think a bronchoscopic biopsy of the left lung lesion, as well as assessment of the other lung findings, and I am referring her to pulmonology for that evaluation. She does have a history of smoking, although thankfully she was able to quit.  From a breast cancer point of view she is going to return to see me in 6 months. Her current bronchitis symptoms are being addressed by her primary care physician. Brinklee knows to call for any problems that may develop before that visit here.  Chauncey Cruel, MD     11/04/2013

## 2013-11-08 ENCOUNTER — Telehealth: Payer: Self-pay | Admitting: Internal Medicine

## 2013-11-08 MED ORDER — LEVOFLOXACIN 500 MG PO TABS: 500.0000 mg | ORAL_TABLET | Freq: Every day | ORAL | Status: DC

## 2013-11-08 NOTE — Telephone Encounter (Signed)
OK for levaquin x 5 more days

## 2013-11-08 NOTE — Telephone Encounter (Signed)
RA pt last seen 01/2013 -- seen 4.1.15 for acute ov  Pt returned call - spoke with patient who stated that she does feel improved since her 4.1.15 acute visit with MR and finished her Levaquin 500mg  this morning.  She is still experiencing chest congestion, prod cough (she does not look at the mucus produced), wheezing and chest tightness.  She does have some PND, but denies any real head congestion.  Pt denies any f/c/s, nausea, vomiting, diarrhea, dyspnea, known hemoptysis.  Pt is asking for an additional 1-2days of the Levaquin.  Dr Elsworth Soho please advise, thank you. [facility] Allergies  Allergen Reactions  . Vicodin [Hydrocodone-Acetaminophen] Other (See Comments)    syncope  . Sulfa Antibiotics Other (See Comments)    Unknown; childhood allergy   Per the 4.1.15 ov w/ MR: Patient Instructions     #Left upper lobe lung mass/nodule  - I will talk to Dr Londell Moh my colleague about setting up navigational bronchoscopy  -My CMA wil work on getting a special disc made of the 10/28/13 CT if not you might have to another CT chest  For bronchitis  - take levaquin 500mg  once daily X 5 days

## 2013-11-08 NOTE — Telephone Encounter (Signed)
Pt aware rx for Levaquin 500mg  - 1 po QD x 5 days sent to [facility].

## 2013-11-08 NOTE — Telephone Encounter (Signed)
LMTCB

## 2013-11-08 NOTE — Addendum Note (Signed)
Addended by: Virl Cagey on: 11/08/2013 05:25 PM   Modules accepted: Orders

## 2013-11-12 ENCOUNTER — Telehealth: Payer: Self-pay | Admitting: Internal Medicine

## 2013-11-12 NOTE — Telephone Encounter (Signed)
Per OV 4.2.15 w/ MR: Patient Instructions      #Left upper lobe lung mass/nodule  - I will talk to Dr Londell Moh my colleague about setting up navigational bronchoscopy -My CMA wil work on getting a special disc made of the 10/28/13 CT if not you might have to another CT chest For bronchitis  -  take levaquin 500mg  once daily  X 5 days  ---  Called and spoke with pt. She is aware we will need to ask MR and he is not in until Monday. Please advise thanks

## 2013-11-15 NOTE — Telephone Encounter (Signed)
Mindy/Triage  I d/w Dr Elsworth Soho yesterday via staff message. She has seen him before. So he has agreed to do biopsy and not Dr Lamonte Sakai because she is his patient. Was Anderson Malta able to get Super D for me? IF so, please hand it over to Dr Elsworth Soho. Keep me posted via message.  I am copying Dr Elsworth Soho on this  Dr. Brand Males, M.D., [facility]C.P Pulmonary and Critical Care Medicine Staff Physician [place] [facility] Pager: [phone], If no answer or between  15:00h - 7:00h: call [phone]  11/15/2013 8:44 AM

## 2013-11-15 NOTE — Telephone Encounter (Signed)
Jamie [name] please advise? thanks

## 2013-11-16 ENCOUNTER — Telehealth: Payer: Self-pay | Admitting: Internal Medicine

## 2013-11-16 DIAGNOSIS — R911 Solitary pulmonary nodule: Secondary | ICD-10-CM

## 2013-11-16 NOTE — Telephone Encounter (Signed)
CD rom given to Dr. Elsworth Soho. Stedman Bing, CMA

## 2013-11-17 NOTE — Telephone Encounter (Signed)
Spoke with pt. She reports she does not want to wait until RA comes back to have her procedure done. She wants this taken care of ASAP now. If RA is not here to do it then she wants MR to set her up with someone else to have this done. Pt requesting [place] to call her back today with this information. Please advise MR thanks

## 2013-11-17 NOTE — Telephone Encounter (Signed)
addressed

## 2013-11-17 NOTE — Telephone Encounter (Signed)
Triage (cc ty Byrum)  Spoke to Dr Lamonte Sakai whos says he is willing to review disc and assess for ENB eligibility and then see how soon he can fit patient in. Can you get over disc to him. I last gave instructions to give to [facility]; ? On his desk right now  Keep me posted via phone message system  . Byrum in office all day and so give disc to him now  Thanks  Dr. Brand Males, M.D., [facility].C.P Pulmonary and Critical Care Medicine Staff Physician [place] [facility] Pager: [phone], If no answer or between  15:00h - 7:00h: call [phone]  11/17/2013 10:26 AM

## 2013-11-17 NOTE — Telephone Encounter (Signed)
Spoke with pt and advised that Dr Lamonte Sakai has agreed to review CT disc and we will contact her regarding further instructions for biopsy.  CT given to Dr Lamonte Sakai

## 2013-11-17 NOTE — Telephone Encounter (Signed)
I have evaluated the CT scan. This lesion is in a difficult location, medial and in upper lobe. I believe ENB would be the best first choice to obtain biopsy. We will work on arranging and then call the patient.

## 2013-11-19 NOTE — Telephone Encounter (Signed)
Please get hold of patient so I can counsel her on biopsy  Dr. Brand Males, M.D., [facility].C.P Pulmonary and Critical Care Medicine Staff Physician [place] [facility] Pager: [phone], If no answer or between  15:00h - 7:00h: call [phone]  11/19/2013 11:11 AM

## 2013-11-19 NOTE — Telephone Encounter (Signed)
Dr. Chase Caller spoke with pt.  Routing back to MR per his request.

## 2013-11-19 NOTE — Telephone Encounter (Signed)
Rob  I spoke to patient and gave her run down on EBUS, risks and benefits and limitations including Risks of pneumothorax, hemothorax, sedation/anesthesia and non diagnosis and  complications such as cardiac or respiratory arrest or hypotension, stroke and bleeding all explained. Benefits of diagnosis but limitations of non-diagnosis also explained. Patient verbalized understanding and wished to proceed.   However, she states she still has not been called with a date yet  Could you please ensure a date.   Let me know the date so I can ensure she gets post EBUS fu  Thanks  Dr. Brand Males, M.D., [facility].C.P Pulmonary and Critical Care Medicine Staff Physician [place] [facility] Pager: [phone], If no answer or between  15:00h - 7:00h: call [phone]  11/19/2013 12:02 PM

## 2013-11-22 NOTE — Telephone Encounter (Signed)
Please see Visit Info comments 

## 2013-11-22 NOTE — Pre-Procedure Instructions (Signed)
Jamie [name]  11/22/2013   Your procedure is scheduled on:  11/26/13  Report to [facility]  2 * 3 at 530 AM.  Call this number if you have problems the morning of surgery: [phone]   Remember:   Do not eat food or drink liquids after midnight.   Take these medicines the morning of surgery with A SIP OF WATER: wellbuttrin,flonase,metoprolol,macrobid   Do not wear jewelry, make-up or nail polish.  Do not wear lotions, powders, or perfumes. You may wear deodorant.  Do not shave 48 hours prior to surgery. Men may shave face and neck.  Do not bring valuables to the hospital.  [facility] is not responsible                  for any belongings or valuables.               Contacts, dentures or bridgework may not be worn into surgery.  Leave suitcase in the car. After surgery it may be brought to your room.  For patients admitted to the hospital, discharge time is determined by your                treatment team.               Patients discharged the day of surgery will not be allowed to drive  home.  Name and phone number of your driver: family  Special Instructions: Incentive Spirometry - Practice and bring it with you on the day of surgery.   Please read over the following fact sheets that you were given: Pain Booklet, Coughing and Deep Breathing and Surgical Site Infection Prevention

## 2013-11-23 ENCOUNTER — Encounter (HOSPITAL_COMMUNITY): Payer: Self-pay

## 2013-11-23 ENCOUNTER — Encounter (HOSPITAL_COMMUNITY)
Admission: RE | Admit: 2013-11-23 | Discharge: 2013-11-23 | Disposition: A | Discharge status: Home / Self Care | Payer: Medicare Other | Source: Ambulatory Visit | Attending: Emergency Medicine | Admitting: Emergency Medicine

## 2013-11-23 DIAGNOSIS — E119 Type 2 diabetes mellitus without complications: Secondary | ICD-10-CM | POA: Diagnosis not present

## 2013-11-23 DIAGNOSIS — J449 Chronic obstructive pulmonary disease, unspecified: Secondary | ICD-10-CM | POA: Diagnosis not present

## 2013-11-23 DIAGNOSIS — Z7982 Long term (current) use of aspirin: Secondary | ICD-10-CM | POA: Diagnosis not present

## 2013-11-23 DIAGNOSIS — Z951 Presence of aortocoronary bypass graft: Secondary | ICD-10-CM | POA: Diagnosis not present

## 2013-11-23 DIAGNOSIS — I509 Heart failure, unspecified: Secondary | ICD-10-CM | POA: Diagnosis not present

## 2013-11-23 DIAGNOSIS — Z9010 Acquired absence of unspecified breast and nipple: Secondary | ICD-10-CM | POA: Diagnosis not present

## 2013-11-23 DIAGNOSIS — Z01818 Encounter for other preprocedural examination: Secondary | ICD-10-CM | POA: Diagnosis not present

## 2013-11-23 DIAGNOSIS — I10 Essential (primary) hypertension: Secondary | ICD-10-CM | POA: Diagnosis not present

## 2013-11-23 DIAGNOSIS — E785 Hyperlipidemia, unspecified: Secondary | ICD-10-CM | POA: Diagnosis not present

## 2013-11-23 DIAGNOSIS — Z87891 Personal history of nicotine dependence: Secondary | ICD-10-CM | POA: Diagnosis not present

## 2013-11-23 DIAGNOSIS — Z853 Personal history of malignant neoplasm of breast: Secondary | ICD-10-CM | POA: Diagnosis not present

## 2013-11-23 DIAGNOSIS — Z01812 Encounter for preprocedural laboratory examination: Secondary | ICD-10-CM | POA: Diagnosis not present

## 2013-11-23 DIAGNOSIS — I6529 Occlusion and stenosis of unspecified carotid artery: Secondary | ICD-10-CM | POA: Diagnosis not present

## 2013-11-23 DIAGNOSIS — Z9221 Personal history of antineoplastic chemotherapy: Secondary | ICD-10-CM | POA: Diagnosis not present

## 2013-11-23 DIAGNOSIS — R911 Solitary pulmonary nodule: Secondary | ICD-10-CM | POA: Diagnosis present

## 2013-11-23 DIAGNOSIS — I252 Old myocardial infarction: Secondary | ICD-10-CM | POA: Diagnosis not present

## 2013-11-23 DIAGNOSIS — Z6841 Body Mass Index (BMI) 40.0 and over, adult: Secondary | ICD-10-CM | POA: Diagnosis not present

## 2013-11-23 DIAGNOSIS — I2510 Atherosclerotic heart disease of native coronary artery without angina pectoris: Secondary | ICD-10-CM | POA: Diagnosis not present

## 2013-11-23 DIAGNOSIS — G4733 Obstructive sleep apnea (adult) (pediatric): Secondary | ICD-10-CM | POA: Diagnosis not present

## 2013-11-23 HISTORY — DX: Anxiety disorder, unspecified: F41.9

## 2013-11-23 LAB — BASIC METABOLIC PANEL
BUN: 14 mg/dL (ref 6–23)
CALCIUM: 9.5 mg/dL (ref 8.4–10.5)
CO2: 25 meq/L (ref 19–32)
Chloride: 104 mEq/L (ref 96–112)
Creatinine, Ser: 0.83 mg/dL (ref 0.50–1.10)
GFR calc Af Amer: 82 mL/min — ABNORMAL LOW (ref >90)
GFR, EST NON AFRICAN AMERICAN: 70 mL/min — AB (ref >90)
Glucose, Bld: 149 mg/dL — ABNORMAL HIGH (ref 70–99)
Potassium: 4.3 mEq/L (ref 3.7–5.3)
SODIUM: 142 meq/L (ref 137–147)

## 2013-11-23 LAB — CBC
HCT: 41.1 % (ref 36.0–46.0)
HEMOGLOBIN: 13.3 g/dL (ref 12.0–15.0)
MCH: 29.4 pg (ref 26.0–34.0)
MCHC: 32.4 g/dL (ref 30.0–36.0)
MCV: 90.7 fL (ref 78.0–100.0)
PLATELETS: 175 10*3/uL (ref 150–400)
RBC: 4.53 MIL/uL (ref 3.87–5.11)
RDW: 13.6 % (ref 11.5–15.5)
WBC: 7 10*3/uL (ref 4.0–10.5)

## 2013-11-24 ENCOUNTER — Other Ambulatory Visit: Payer: Self-pay | Admitting: Cardiology

## 2013-11-24 NOTE — Telephone Encounter (Signed)
This is actually an ENB with TBBx's. We are working on scheduling for this coming Friday 11/26/13

## 2013-11-24 NOTE — Progress Notes (Signed)
Anesthesia chart review: Patient is a 68 year old female scheduled for bronchoscopy with endobronchial navigation, left on 11/26/13 by Dr. Lamonte Sakai.  History includes CAD/NSTEMI s/p CABG (LIMA-LAD, SVG-RCA, SVG-diagonal and OM) 02/18/95, diastolic CHF, left breast cancer s/p bilateral mastectomies with reconstruction 10/2011, former smoker, COPD, HLD, DM2 on metformin, OSA, anxiety, HTN, migraines, asthma, carotid artery stenosis, laparoscopic gastric banding '09, nasal sinus surgery, hysterectomy, appendectomy. BMI is 41 consistent with morbid obesity. Cardiologist is Dr. Loralie Champagne, last visit 03/2013.  Pulmonologist is Dr. Brand Males. PCP is Dr. Randell Patient. Oncologist is Dr. Jana Hakim.  EKG on 01/26/13 showed SR, old inferior infarct.  Echo on 02/04/13 showed:  - Left ventricle: The cavity size was normal. Wall thickness was normal. Systolic function was normal. The estimated ejection fraction was in the range of 60% to 65%. Wall motion was normal; there were no regional wall motion abnormalities. There was an increased relative contribution of atrial contraction to ventricular filling.  - Aortic valve: Valve area: 3.07cm^2(VTI). Valve area: 3.14cm^2 (Vmax). - Tricuspid valve: Trivial regurgitation.  Her last cardiac cath was on 12/11/11 prior to her CABG and showed: Total occlusion of the mid RCA with collaterals from the LAD. 75% ostial LM stenosis with damping and chest pain upon vessel engagement. 80% focal proximal LAD stenosis. LVEDP moderately elevated.  Carotid duplex on 12/15/12 showed: 40-59% RICA stenosis, 7-89% LICA stenosis.  CXR on 11/23/13 showed no acute cardiopulmonary disease, left suprahilar nodule.  Spirometry 01/27/13 showed unchanged prominent function with FEV1 of 67% and FVC of 71% with a ratio 73, again no evidence of airway obstruction.  Preoperative labs noted.   Patient has seen cardiology with echo within the past year and had revascularization within the past two  years.  If no acute changes then I would anticipate that she could proceed as planned.  George Hugh [facility] Stay [facility]/Anesthesiology Phone [phone] 11/24/2013 10:13 AM

## 2013-11-26 ENCOUNTER — Ambulatory Visit (HOSPITAL_COMMUNITY): Payer: Medicare Other

## 2013-11-26 ENCOUNTER — Ambulatory Visit (HOSPITAL_COMMUNITY): Payer: Medicare Other | Admitting: Anesthesiology

## 2013-11-26 ENCOUNTER — Encounter (HOSPITAL_COMMUNITY): Payer: Medicare Other | Admitting: Vascular Surgery

## 2013-11-26 ENCOUNTER — Encounter (HOSPITAL_COMMUNITY)
Admission: RE | Disposition: A | Discharge status: Home / Self Care | Payer: Self-pay | Source: Ambulatory Visit | Attending: Emergency Medicine

## 2013-11-26 ENCOUNTER — Encounter (HOSPITAL_COMMUNITY): Payer: Self-pay | Admitting: *Deleted

## 2013-11-26 ENCOUNTER — Ambulatory Visit (HOSPITAL_COMMUNITY)
Admission: RE | Admit: 2013-11-26 | Discharge: 2013-11-26 | Disposition: A | Discharge status: Home / Self Care | Payer: Medicare Other | Source: Ambulatory Visit | Attending: Emergency Medicine | Admitting: Emergency Medicine

## 2013-11-26 DIAGNOSIS — J449 Chronic obstructive pulmonary disease, unspecified: Secondary | ICD-10-CM | POA: Insufficient documentation

## 2013-11-26 DIAGNOSIS — Z87891 Personal history of nicotine dependence: Secondary | ICD-10-CM | POA: Insufficient documentation

## 2013-11-26 DIAGNOSIS — Z01818 Encounter for other preprocedural examination: Secondary | ICD-10-CM | POA: Insufficient documentation

## 2013-11-26 DIAGNOSIS — R911 Solitary pulmonary nodule: Secondary | ICD-10-CM | POA: Diagnosis present

## 2013-11-26 DIAGNOSIS — I10 Essential (primary) hypertension: Secondary | ICD-10-CM | POA: Insufficient documentation

## 2013-11-26 DIAGNOSIS — Z951 Presence of aortocoronary bypass graft: Secondary | ICD-10-CM | POA: Insufficient documentation

## 2013-11-26 DIAGNOSIS — I509 Heart failure, unspecified: Secondary | ICD-10-CM | POA: Insufficient documentation

## 2013-11-26 DIAGNOSIS — E785 Hyperlipidemia, unspecified: Secondary | ICD-10-CM | POA: Insufficient documentation

## 2013-11-26 DIAGNOSIS — Z6841 Body Mass Index (BMI) 40.0 and over, adult: Secondary | ICD-10-CM | POA: Insufficient documentation

## 2013-11-26 DIAGNOSIS — Z7982 Long term (current) use of aspirin: Secondary | ICD-10-CM | POA: Insufficient documentation

## 2013-11-26 DIAGNOSIS — I6529 Occlusion and stenosis of unspecified carotid artery: Secondary | ICD-10-CM | POA: Insufficient documentation

## 2013-11-26 DIAGNOSIS — Z9221 Personal history of antineoplastic chemotherapy: Secondary | ICD-10-CM | POA: Insufficient documentation

## 2013-11-26 DIAGNOSIS — I252 Old myocardial infarction: Secondary | ICD-10-CM | POA: Insufficient documentation

## 2013-11-26 DIAGNOSIS — E119 Type 2 diabetes mellitus without complications: Secondary | ICD-10-CM | POA: Insufficient documentation

## 2013-11-26 DIAGNOSIS — Z853 Personal history of malignant neoplasm of breast: Secondary | ICD-10-CM | POA: Insufficient documentation

## 2013-11-26 DIAGNOSIS — J4489 Other specified chronic obstructive pulmonary disease: Secondary | ICD-10-CM | POA: Insufficient documentation

## 2013-11-26 DIAGNOSIS — Z01812 Encounter for preprocedural laboratory examination: Secondary | ICD-10-CM | POA: Insufficient documentation

## 2013-11-26 DIAGNOSIS — Z9010 Acquired absence of unspecified breast and nipple: Secondary | ICD-10-CM | POA: Insufficient documentation

## 2013-11-26 DIAGNOSIS — G4733 Obstructive sleep apnea (adult) (pediatric): Secondary | ICD-10-CM | POA: Insufficient documentation

## 2013-11-26 DIAGNOSIS — I2510 Atherosclerotic heart disease of native coronary artery without angina pectoris: Secondary | ICD-10-CM | POA: Insufficient documentation

## 2013-11-26 HISTORY — PX: VIDEO BRONCHOSCOPY WITH ENDOBRONCHIAL NAVIGATION: SHX6175

## 2013-11-26 LAB — GLUCOSE, CAPILLARY
GLUCOSE-CAPILLARY: 86 mg/dL (ref 70–99)
GLUCOSE-CAPILLARY: 86 mg/dL (ref 70–99)

## 2013-11-26 LAB — SURGICAL PCR SCREEN
MRSA, PCR: POSITIVE — AB
Staphylococcus aureus: POSITIVE — AB

## 2013-11-26 SURGERY — VIDEO BRONCHOSCOPY WITH ENDOBRONCHIAL NAVIGATION
Anesthesia: General | Laterality: Left

## 2013-11-26 MED ORDER — LACTATED RINGERS IV SOLN
INTRAVENOUS | Status: DC | PRN
  Administered 2013-11-26 (×2): via INTRAVENOUS

## 2013-11-26 MED ORDER — METOPROLOL TARTRATE 50 MG PO TABS
ORAL_TABLET | ORAL | Status: AC
  Administered 2013-11-26: 50 mg
  Filled 2013-11-26: qty 1

## 2013-11-26 MED ORDER — EPHEDRINE SULFATE 50 MG/ML IJ SOLN
INTRAMUSCULAR | Status: AC
  Filled 2013-11-26: qty 1

## 2013-11-26 MED ORDER — GLYCOPYRROLATE 0.2 MG/ML IJ SOLN
INTRAMUSCULAR | Status: AC
  Filled 2013-11-26: qty 3

## 2013-11-26 MED ORDER — SUCCINYLCHOLINE CHLORIDE 20 MG/ML IJ SOLN
INTRAMUSCULAR | Status: DC | PRN
  Administered 2013-11-26: 120 mg via INTRAVENOUS

## 2013-11-26 MED ORDER — MUPIROCIN 2 % EX OINT
TOPICAL_OINTMENT | Freq: Two times a day (BID) | CUTANEOUS | Status: DC
  Administered 2013-11-26: 1 via NASAL
  Filled 2013-11-26 (×2): qty 22

## 2013-11-26 MED ORDER — ONDANSETRON HCL 4 MG/2ML IJ SOLN
INTRAMUSCULAR | Status: AC
  Filled 2013-11-26: qty 2

## 2013-11-26 MED ORDER — GLYCOPYRROLATE 0.2 MG/ML IJ SOLN
INTRAMUSCULAR | Status: DC | PRN
  Administered 2013-11-26: .8 mg via INTRAVENOUS

## 2013-11-26 MED ORDER — MEPERIDINE HCL 25 MG/ML IJ SOLN: 6.2500 mg | INTRAMUSCULAR | Status: DC | PRN

## 2013-11-26 MED ORDER — ROCURONIUM BROMIDE 50 MG/5ML IV SOLN
INTRAVENOUS | Status: AC
  Filled 2013-11-26: qty 1

## 2013-11-26 MED ORDER — HYDROMORPHONE HCL PF 1 MG/ML IJ SOLN: 0.2500 mg | INTRAMUSCULAR | Status: DC | PRN

## 2013-11-26 MED ORDER — PHENYLEPHRINE HCL 10 MG/ML IJ SOLN
INTRAMUSCULAR | Status: DC | PRN
  Administered 2013-11-26 (×2): 80 ug via INTRAVENOUS
  Administered 2013-11-26: 40 ug via INTRAVENOUS
  Administered 2013-11-26 (×4): 80 ug via INTRAVENOUS

## 2013-11-26 MED ORDER — ONDANSETRON HCL 4 MG/2ML IJ SOLN: 4.0000 mg | Freq: Once | INTRAMUSCULAR | Status: DC | PRN

## 2013-11-26 MED ORDER — 0.9 % SODIUM CHLORIDE (POUR BTL) OPTIME
TOPICAL | Status: DC | PRN
  Administered 2013-11-26: 1000 mL

## 2013-11-26 MED ORDER — LIDOCAINE HCL 4 % MT SOLN
OROMUCOSAL | Status: DC | PRN
  Administered 2013-11-26: 4 mL via TOPICAL

## 2013-11-26 MED ORDER — FENTANYL CITRATE 0.05 MG/ML IJ SOLN
INTRAMUSCULAR | Status: DC | PRN
  Administered 2013-11-26 (×3): 50 ug via INTRAVENOUS

## 2013-11-26 MED ORDER — FENTANYL CITRATE 0.05 MG/ML IJ SOLN
INTRAMUSCULAR | Status: AC
  Filled 2013-11-26: qty 5

## 2013-11-26 MED ORDER — NEOSTIGMINE METHYLSULFATE 1 MG/ML IJ SOLN
INTRAMUSCULAR | Status: AC
  Filled 2013-11-26: qty 10

## 2013-11-26 MED ORDER — STERILE WATER FOR INJECTION IJ SOLN
INTRAMUSCULAR | Status: AC
  Filled 2013-11-26: qty 10

## 2013-11-26 MED ORDER — SUCCINYLCHOLINE CHLORIDE 20 MG/ML IJ SOLN
INTRAMUSCULAR | Status: AC
  Filled 2013-11-26: qty 1

## 2013-11-26 MED ORDER — OXYCODONE HCL 5 MG PO TABS: 5.0000 mg | ORAL_TABLET | Freq: Once | ORAL | Status: DC | PRN

## 2013-11-26 MED ORDER — LIDOCAINE HCL (CARDIAC) 20 MG/ML IV SOLN
INTRAVENOUS | Status: AC
  Filled 2013-11-26: qty 10

## 2013-11-26 MED ORDER — PROPOFOL 10 MG/ML IV BOLUS
INTRAVENOUS | Status: AC
  Filled 2013-11-26: qty 20

## 2013-11-26 MED ORDER — EPHEDRINE SULFATE 50 MG/ML IJ SOLN
INTRAMUSCULAR | Status: DC | PRN
  Administered 2013-11-26 (×4): 10 mg via INTRAVENOUS
  Administered 2013-11-26 (×2): 5 mg via INTRAVENOUS

## 2013-11-26 MED ORDER — NEOSTIGMINE METHYLSULFATE 1 MG/ML IJ SOLN
INTRAMUSCULAR | Status: DC | PRN
  Administered 2013-11-26: 5 mg via INTRAVENOUS

## 2013-11-26 MED ORDER — EPINEPHRINE HCL 1 MG/ML IJ SOLN
INTRAMUSCULAR | Status: AC
  Filled 2013-11-26: qty 1

## 2013-11-26 MED ORDER — MIDAZOLAM HCL 2 MG/2ML IJ SOLN
INTRAMUSCULAR | Status: AC
  Filled 2013-11-26: qty 2

## 2013-11-26 MED ORDER — PROPOFOL 10 MG/ML IV BOLUS
INTRAVENOUS | Status: DC | PRN
  Administered 2013-11-26: 30 mg via INTRAVENOUS
  Administered 2013-11-26: 160 mg via INTRAVENOUS

## 2013-11-26 MED ORDER — ONDANSETRON HCL 4 MG/2ML IJ SOLN
INTRAMUSCULAR | Status: DC | PRN
  Administered 2013-11-26: 4 mg via INTRAVENOUS

## 2013-11-26 MED ORDER — PHENYLEPHRINE 40 MCG/ML (10ML) SYRINGE FOR IV PUSH (FOR BLOOD PRESSURE SUPPORT)
PREFILLED_SYRINGE | INTRAVENOUS | Status: AC
  Filled 2013-11-26: qty 10

## 2013-11-26 MED ORDER — OXYCODONE HCL 5 MG/5ML PO SOLN: 5.0000 mg | Freq: Once | ORAL | Status: DC | PRN

## 2013-11-26 MED ORDER — ROCURONIUM BROMIDE 100 MG/10ML IV SOLN
INTRAVENOUS | Status: DC | PRN
  Administered 2013-11-26: 25 mg via INTRAVENOUS

## 2013-11-26 SURGICAL SUPPLY — 38 items
BRUSH CYTOL CELLEBRITY 1.5X140 (MISCELLANEOUS) ×3 IMPLANT
BRUSH SUPERTRAX BIOPSY (INSTRUMENTS) ×3 IMPLANT
BRUSH SUPERTRAX NDL-TIP CYTO (INSTRUMENTS) ×3 IMPLANT
CANISTER SUCTION 2500CC (MISCELLANEOUS) ×3 IMPLANT
CHANNEL WORK EXTEND EDGE 180 (KITS) IMPLANT
CHANNEL WORK EXTEND EDGE 45 (KITS) IMPLANT
CHANNEL WORK EXTEND EDGE 90 (KITS) IMPLANT
CONT SPEC 4OZ CLIKSEAL STRL BL (MISCELLANEOUS) ×3 IMPLANT
COVER TABLE BACK 60X90 (DRAPES) ×3 IMPLANT
FILTER STRAW FLUID ASPIR (MISCELLANEOUS) IMPLANT
FORCEPS BIOP SUPERTRX PREMAR (INSTRUMENTS) IMPLANT
GLOVE BIOGEL M STRL SZ7.5 (GLOVE) ×6 IMPLANT
GLOVE BIOGEL PI IND STRL 6.5 (GLOVE) ×1 IMPLANT
GLOVE BIOGEL PI INDICATOR 6.5 (GLOVE) ×2
GLOVE SURG SS PI 7.0 STRL IVOR (GLOVE) ×3 IMPLANT
GOWN STRL REUS W/ TWL XL LVL3 (GOWN DISPOSABLE) ×1 IMPLANT
GOWN STRL REUS W/TWL MED LVL3 (GOWN DISPOSABLE) ×6 IMPLANT
GOWN STRL REUS W/TWL XL LVL3 (GOWN DISPOSABLE) ×3
KIT LOCATABLE GUIDE (CANNULA) ×3 IMPLANT
KIT MARKER FIDUCIAL DELIVERY (KITS) IMPLANT
KIT PROCEDURE EDGE 180 (KITS) ×3 IMPLANT
KIT PROCEDURE EDGE 45 (KITS) IMPLANT
KIT PROCEDURE EDGE 90 (KITS) IMPLANT
KIT ROOM TURNOVER OR (KITS) ×3 IMPLANT
MARKER SKIN DUAL TIP RULER LAB (MISCELLANEOUS) ×3 IMPLANT
NEEDLE SUPERTRX PREMARK BIOPSY (NEEDLE) ×6 IMPLANT
NS IRRIG 1000ML POUR BTL (IV SOLUTION) ×3 IMPLANT
OIL SILICONE PENTAX (PARTS (SERVICE/REPAIRS)) ×3 IMPLANT
PAD ARMBOARD 7.5X6 YLW CONV (MISCELLANEOUS) ×6 IMPLANT
PATCHES PATIENT (LABEL) ×12 IMPLANT
SPONGE GAUZE 4X4 12PLY (GAUZE/BANDAGES/DRESSINGS) ×3 IMPLANT
SYR 20CC LL (SYRINGE) ×3 IMPLANT
SYR 20ML ECCENTRIC (SYRINGE) ×3 IMPLANT
SYR 50ML SLIP (SYRINGE) IMPLANT
TOWEL OR 17X24 6PK STRL BLUE (TOWEL DISPOSABLE) ×3 IMPLANT
TRAP SPECIMEN MUCOUS 40CC (MISCELLANEOUS) ×3 IMPLANT
TUBE CONNECTING 12'X1/4 (SUCTIONS) ×1
TUBE CONNECTING 12X1/4 (SUCTIONS) ×2 IMPLANT

## 2013-11-26 NOTE — Transfer of Care (Signed)
Immediate Anesthesia Transfer of Care Note  Patient: Jamie [name]  Procedure(s) Performed: Procedure(s): VIDEO BRONCHOSCOPY WITH ENDOBRONCHIAL NAVIGATION (Left)  Patient Location: PACU  Anesthesia Type:General  Level of Consciousness: awake, alert , oriented and patient cooperative  Airway & Oxygen Therapy: Patient Spontanous Breathing and Patient connected to nasal cannula oxygen  Post-op Assessment: Report given to PACU RN and Post -op Vital signs reviewed and stable  Post vital signs: Reviewed and stable  Complications: No apparent anesthesia complications

## 2013-11-26 NOTE — H&P (View-Only) (Signed)
Subjective:    Patient ID: Jamie [name], female    DOB: 12-Feb-1946, 68 y.o.   MRN: [id number]  HPI   PCP- Nira Conn Spry  68 year old ex-smoker presents for evaluation of excessive daytime fatigue and dyspnea. She underwent bilateral mastectomy for left breast cancer. This did involve a lymph node, unfortunately her first chemotherapy was complicated by an acute myocardial infarction. She subsequently required CABG in June 2013. Preop echo showed normal ejection fraction and she did recover but never fully he came her activity level. She was unable to participate in rehabilitation and instead chose to use her daughter swimming pool for exercise. Had LAP-BAND surgery in 2009 and her weight decreased from 280-2-30 pounds, but she has regained to 265 now. Prior to lap band surgery, she had PFTs that showed moderate restriction with FVC of 66%, FEV1 of 64% and a ratio 76 with DLCO 56%.  Spirometry today shows unchanged prominent function with FEV1 of 67% and FVC of 71% with a ratio 73, again no evidence of airway obstruction. She did smoke about 80 pack years before she quit in 2009.  Overnight polysomnogram in November 2009 showed mild obstructive sleep apnea with AHI of 11 events per hour with severe oxygen desaturation to 67%.  She spent 80 minutes with a saturation less than 88%. 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 She did not desaturate on walking   OV 11/04/2013 Chief Complaint  Patient presents with  . Acute Visit    Dr.Alva pt, last OV 01-2013 for abnormal CT and pet scan. Pt states she is having a productive cough, chest congestion, and SOB.       Referred by Dr. Jana Hakim for evaluation of left upper lobe mass  She is morbidly obese. She is ex heavy smoker 80 pack history quit 7 years ago. She is a diagnosis of breast cancer on the left breast from a few years ago. She status post bilateral mastectomies for the  same. First chemotherapy was compensated by my card he'll infarction for which he underwent quadruple bypass according to her history. Since then she's had no more chemotherapy and is in complete remission.  From a respiratory standpoint she is essentially asymptomatic. CT scan of the chest in May 2013 showed some mild left upper lobe platelike atelectasis. Currently on followup CT scan of the chest 10/28/2013 this is much enlarged and is active on PET scan which is 4 cm craniocaudal dimension and 1 cm answers. It is active on PET scan. There no other PET scan uptake areas. It appears as an airway leading up to this mass.  Of note, the last 1 or 2 days she's had bronchitis symptoms with cough and congestion. This is after she got exposed to her grandson who is sick with respiratory infection. She's asking for antibiotics for the same   CT chest 10/28/13  COMPARISON: NM PET IMAGE RESTAG (PS) SKULL BASE TO THIGH dated  10/28/2013; CT CHEST W/CM dated 09/10/2011; CT ANGIO CHEST W/CM &/OR  WO/CM dated 12/10/2011  FINDINGS:  Bilateral mastectomies with a submuscular implants. No axillary  adenopathy. No supraclavicular or internal mammary adenopathy. No  mediastinal adenopathy.  Within the left upper lobe there is a new elongated focus of  consolidation which measures 13 by 10 mm in axial dimension (image  14, series 5 and 40 mm in craniocaudad dimension. On the comparison  exam from 12/10/2011 there is  a similar pattern albeit smaller and  more medial measuring 32 by 4 x 10 mm. . Airways are normal.  Review of the upper abdomen demonstrates normal adrenal glands.  There is diffuse low-attenuation in the liver. Gastric banding  device noted. No aggressive osseous lesion.  IMPRESSION:  1. The linear elongated focus of consolidation in the medial left  upper lobe is increased in size compared to prior exam. Due to  metabolic activity on comparison exam recommend either percutaneous  sampling or  bronchoscopy of for sampling of this atypical lesion.  2. No additional evidence of metastatic disease.  Electronically Signed  By: Suzy Bouchard M.D.  On: 10/28/2013 14:01    PET Scan 10/28/13  No focal hypermetabolic activity to suggest skeletal metastasis.  IMPRESSION:  1. Intense hypermetabolic activity associated with linear nodular  thickening in the left upper lobe. Although this is atypical  presentation for breast cancer metastasis, due to the intense  metabolic activity recommend either percutaneous sampling or  bronchoscopic sampling. Differential diagnosis would also included  inflammatory response or fungal infection.  2. Two additional foci of uptake while within the right upper lobe  and inferior lingula without clear measurable disease on the CT  portion. Within this pattern may suggest that the pulmonary  parenchymal findings are an inflammatory response or potentially a  fungal infection.  3. No additional evidence of metastatic disease on the whole-body  scan.  Electronically Signed  By: Suzy Bouchard M.D.  On: 10/28/2013 13:59  Past Medical History  Diagnosis Date  . Asthma   . COPD (chronic obstructive pulmonary disease)   . UTI (lower urinary tract infection)     freq-bladder implant -removed  . Hx of migraines   . CAD (coronary artery disease)     NSTEMI 5/13:  LHC demonstrated oLM 75%, pLAD 80%, RCA occluded, filled by collaterals from the LAD.  Echo 12/10/11: EF 60%.; CABG 12/13/11: LIMA-LAD, SVG-RCA, SVG-diagonal and OM.   Marland Kitchen HLD (hyperlipidemia)   . Carotid stenosis     Pre-CABG Dopplers 5/13: Bilateral 40-59%  . Hypertension   . Myocardial infarction   . Obesity   . Sleep apnea     sleep study2-3 yrs ago lost weight afterward but gained back  . Breast cancer 08/2011    lt. breast ca  . DM2 (diabetes mellitus, type 2)     diet controlled     Family History  Problem Relation Age of Onset  . Anesthesia problems Neg Hx   . Hypotension Neg Hx    . Malignant hyperthermia Neg Hx   . Pseudochol deficiency Neg Hx   . Heart disease Mother   . Breast cancer Maternal Aunt   . Ovarian cancer Paternal Aunt   . Breast cancer Maternal Aunt   . Heart disease Father      History   Social History  . Marital Status: Married    Spouse Name: N/A    Number of Children: 6  . Years of Education: N/A   Occupational History  . retired    Social History Main Topics  . Smoking status: Former Smoker -- 2.00 packs/day for 40 years    Types: Cigarettes    Quit date: 08/25/2006  . Smokeless tobacco: Never Used  . Alcohol Use: Yes     Comment: 1-2 times a year  . Drug Use: No  . Sexual Activity: Not Currently   Other Topics Concern  . Not on file   Social History Narrative  . No  narrative on file     Allergies  Allergen Reactions  . Vicodin [Hydrocodone-Acetaminophen] Other (See Comments)    syncope  . Sulfa Antibiotics Other (See Comments)    Unknown; childhood allergy     Outpatient Prescriptions Prior to Visit  Medication Sig Dispense Refill  . anastrozole (ARIMIDEX) 1 MG tablet Take 1 tablet (1 mg total) by mouth daily.  90 tablet  3  . aspirin 325 MG EC tablet Take 325 mg by mouth daily.      Marland Kitchen atorvastatin (LIPITOR) 80 MG tablet Take 1 tablet (80 mg total) by mouth daily at 6 PM.  90 tablet  3  . buPROPion (WELLBUTRIN XL) 150 MG 24 hr tablet Take 300 mg by mouth daily.       . fluticasone (FLONASE) 50 MCG/ACT nasal spray Place 2 sprays into both nostrils daily.       Marland Kitchen lisinopril (PRINIVIL,ZESTRIL) 10 MG tablet TAKE ONE TABLET BY MOUTH EVERY DAY  90 tablet  1  . loratadine (CLARITIN) 10 MG tablet Take 10 mg by mouth daily.      . metFORMIN (GLUCOPHAGE) 500 MG tablet Take 1 tablet by mouth 2 (two) times daily.      . metoprolol (LOPRESSOR) 50 MG tablet Take 1 tablet (50 mg total) by mouth 2 (two) times daily.  180 tablet  3  . nitrofurantoin, macrocrystal-monohydrate, (MACROBID) 100 MG capsule Take 1 capsule by mouth  daily.      . trospium (SANCTURA) 20 MG tablet Take 20 mg by mouth at bedtime.        No facility-administered medications prior to visit.      Review of Systems  Constitutional: Negative for fever and unexpected weight change.  HENT: Negative for congestion, dental problem, ear pain, nosebleeds, postnasal drip, rhinorrhea, sinus pressure, sneezing, sore throat and trouble swallowing.   Eyes: Negative for redness and itching.  Respiratory: Positive for cough and shortness of breath. Negative for chest tightness and wheezing.   Cardiovascular: Negative for palpitations and leg swelling.  Gastrointestinal: Negative for nausea and vomiting.  Genitourinary: Negative for dysuria.  Musculoskeletal: Negative for joint swelling.  Skin: Negative for rash.  Neurological: Negative for headaches.  Hematological: Does not bruise/bleed easily.  Psychiatric/Behavioral: Negative for dysphoric mood. The patient is not nervous/anxious.        Objective:   Physical Exam  Vitals reviewed. Constitutional: She is oriented to person, place, and time. She appears well-developed and well-nourished. No distress.  Body mass index is 42.53 kg/(m^2). Looks flat affect Mask on Perioidic cough+  HENT:  Head: Normocephalic and atraumatic.  Right Ear: External ear normal.  Left Ear: External ear normal.  Mouth/Throat: Oropharynx is clear and moist. No oropharyngeal exudate.  Eyes: Conjunctivae and EOM are normal. Pupils are equal, round, and reactive to light. Right eye exhibits no discharge. Left eye exhibits no discharge. No scleral icterus.  Neck: Normal range of motion. Neck supple. No JVD present. No tracheal deviation present. No thyromegaly present.  Cardiovascular: Normal rate, regular rhythm, normal heart sounds and intact distal pulses.  Exam reveals no gallop and no friction rub.   No murmur heard. Pulmonary/Chest: Effort normal and breath sounds normal. No respiratory distress. She has no  wheezes. She has no rales. She exhibits no tenderness.  Abdominal: Soft. Bowel sounds are normal. She exhibits no distension and no mass. There is no tenderness. There is no rebound and no guarding.  Musculoskeletal: Normal range of motion. She exhibits no edema and no  tenderness.  Lymphadenopathy:    She has no cervical adenopathy.  Neurological: She is alert and oriented to person, place, and time. She has normal reflexes. No cranial nerve deficit. She exhibits normal muscle tone. Coordination normal.  Skin: Skin is warm and dry. No rash noted. She is not diaphoretic. No erythema. No pallor.  Psychiatric: She has a normal mood and affect. Her behavior is normal. Judgment and thought content normal.          Assessment & Plan:  #Left upper lobe lung mass/nodule  - I will talk to Dr Londell Moh my colleague about setting up navigational bronchoscopy -My CMA wil work on getting a special disc made of the 10/28/13 CT if not you might have to another CT chest  For bronchitis  -  take levaquin 500mg  once daily  X 5 day

## 2013-11-26 NOTE — Anesthesia Postprocedure Evaluation (Signed)
Anesthesia Post Note  Patient: Jamie [name]  Procedure(s) Performed: Procedure(s) (LRB): VIDEO BRONCHOSCOPY WITH ENDOBRONCHIAL NAVIGATION (Left)  Anesthesia type: general  Patient location: PACU  Post pain: Pain level controlled  Post assessment: Patient's Cardiovascular Status Stable  Last Vitals:  Filed Vitals:   11/26/13 1130  BP:   Pulse: 88  Temp: 36.1 C  Resp: 15    Post vital signs: Reviewed and stable  Level of consciousness: sedated  Complications: No apparent anesthesia complications

## 2013-11-26 NOTE — Anesthesia Preprocedure Evaluation (Addendum)
Anesthesia Evaluation  Patient identified by MRN, date of birth, ID band Patient awake    Reviewed: Allergy & Precautions, H&P , NPO status , Patient's Chart, lab work & pertinent test results  Airway Mallampati: I TM Distance: >3 FB Neck ROM: Full    Dental  (+) Dental Advisory Given, Teeth Intact   Pulmonary shortness of breath and with exertion, asthma , sleep apnea , COPDformer smoker,  Bronchitis finishing antibiotic now Mild OSA did not recommend a mask         Cardiovascular hypertension, Pt. on medications + CAD, + Past MI, + CABG and +CHF     Neuro/Psych Anxiety negative neurological ROS     GI/Hepatic negative GI ROS, Neg liver ROS,   Endo/Other  diabetes, Well Controlled, Type 2, Oral Hypoglycemic AgentsMorbid obesity  Renal/GU negative Renal ROS     Musculoskeletal negative musculoskeletal ROS (+)   Abdominal   Peds  Hematology negative hematology ROS (+)   Anesthesia Other Findings   Reproductive/Obstetrics                      Anesthesia Physical Anesthesia Plan  ASA: III  Anesthesia Plan: General   Post-op Pain Management:    Induction: Intravenous  Airway Management Planned: Oral ETT  Additional Equipment:   Intra-op Plan:   Post-operative Plan: Extubation in OR  Informed Consent: I have reviewed the patients History and Physical, chart, labs and discussed the procedure including the risks, benefits and alternatives for the proposed anesthesia with the patient or authorized representative who has indicated his/her understanding and acceptance.     Plan Discussed with: CRNA and Surgeon  Anesthesia Plan Comments:         Anesthesia Quick Evaluation

## 2013-11-26 NOTE — Discharge Instructions (Signed)
Flexible Bronchoscopy, Care After These instructions give you information on caring for yourself after your procedure. Your doctor may also give you more specific instructions. Call your doctor if you have any problems or questions after your procedure. HOME CARE  Do not eat or drink anything for 2 hours after your procedure. If you try to eat or drink before the medicine wears off, food or drink could go into your lungs. You could also burn yourself.  After 2 hours have passed and when you can cough and gag normally, you may eat soft food and drink liquids slowly.  The day after the test, you may eat your normal diet.  You may do your normal activities.  Keep all doctor visits. GET HELP RIGHT AWAY IF:  You get more and more short of breath.  You get lightheaded.  You feel like you are going to pass out (faint).  You have chest pain.  You have new problems that worry you.  You cough up more than a little blood.  You cough up more blood than before. MAKE SURE YOU:  Understand these instructions.  Will watch your condition.  Will get help right away if you are not doing well or get worse. Document Released: 05/19/2009 Document Revised: 05/12/2013 Document Reviewed: 03/26/2013 [facility] Patient Information 2014 [place].  Please call our office for any problems or questions. [phone].    ,shaa General Anesthesia, Adult, Care After  Refer to this sheet in the next few weeks. These instructions provide you with information on caring for yourself after your procedure. Your health care provider may also give you more specific instructions. Your treatment has been planned according to current medical practices, but problems sometimes occur. Call your health care provider if you have any problems or questions after your procedure.  WHAT TO EXPECT AFTER THE PROCEDURE  After the procedure, it is typical to experience:  Sleepiness.  Nausea and vomiting. HOME CARE  INSTRUCTIONS  For the first 24 hours after general anesthesia:  Have a responsible person with you.  Do not drive a car. If you are alone, do not take public transportation.  Do not drink alcohol.  Do not take medicine that has not been prescribed by your health care provider.  Do not sign important papers or make important decisions.  You may resume a normal diet and activities as directed by your health care provider.  Change bandages (dressings) as directed.  If you have questions or problems that seem related to general anesthesia, call the hospital and ask for the anesthetist or anesthesiologist on call. SEEK MEDICAL CARE IF:  You have nausea and vomiting that continue the day after anesthesia.  You develop a rash. SEEK IMMEDIATE MEDICAL CARE IF:  You have difficulty breathing.  You have chest pain.  You have any allergic problems. Document Released: 10/28/2000 Document Revised: 03/24/2013 Document Reviewed: 02/04/2013  [facility] Patient Information 2014 [place], [place].

## 2013-11-26 NOTE — Interval H&P Note (Signed)
PCCM Interval H&P  Pleasant 68 yo woman with hx CAD/CABG, mild OSA, mixed disease on spirometry with obstruction characterized as COPD/asthma. She does not require scheduled BD's. Hx of breast CA s/p mastectomies B.  A LUL nodule was discovered on CT / PET scan 2/59/56, hypermetabolic. She is referred for bx via FOB.   Filed Vitals:   11/26/13 0548 11/26/13 0624  BP: 154/43   Pulse: 66 70  Temp: 97.6 F (36.4 C)   TempSrc: Oral   Height: 5' 5.5" (1.664 m)   Weight: 114.533 kg (252 lb 8 oz)   SpO2: 97%   Gen: Pleasant, obese woman, in no distress,  Appropriately anxious about procedure  ENT: No lesions,  mouth clear,  oropharynx clear, no postnasal drip  Lungs: No use of accessory muscles, clear without rales or rhonchi  Cardiovascular: RRR, heart sounds normal, no murmur or gallops, no peripheral edema  Musculoskeletal: No deformities, no cyanosis or clubbing  Neuro: alert, non focal  Skin: Warm, no lesions or rashes   Recent Labs Lab 11/23/13 1329  HGB 13.3  HCT 41.1  WBC 7.0  PLT 175    Recent Labs Lab 11/23/13 1329  NA 142  K 4.3  CL 104  CO2 25  GLUCOSE 149*  BUN 14  CREATININE 0.83  CALCIUM 9.5    10/28/12 -- COMPARISON: CT ABD-PELV W/ CM dated 05/19/2012  FINDINGS:  NECK  No hypermetabolic lymph nodes in the neck.  CHEST  In left upper lobe there is increased linear nodular thickening  extending from the left superior hilum to the left lung apex.  Nodularity measures 12 x 15 mm in axial dimension extends over  approximately 4 cm. This is increased in thickness compared to prior  CT. The metabolic activity is intense with SUV max of 15.1.  There is a focus of intense metabolic activity in the right upper  lobe with out measurable disease on CT portion with SUV max 4.1. The  third focus of hypermetabolic activity within the more inferior  lingula (image 68) also without clear measurable disease on CT  portion.  No hypermetabolic mediastinal lymph  nodes. No hypermetabolic  axillary lymph nodes. Bilateral mastectomy anatomy noted.  ABDOMEN/PELVIS  No abnormal hypermetabolic activity within the liver, pancreas,  adrenal glands, or spleen. No hypermetabolic lymph nodes in the  abdomen or pelvis.  SKELETON  No focal hypermetabolic activity to suggest skeletal metastasis.  IMPRESSION:  1. Intense hypermetabolic activity associated with linear nodular  thickening in the left upper lobe. Although this is atypical  presentation for breast cancer metastasis, due to the intense  metabolic activity recommend either percutaneous sampling or  bronchoscopic sampling. Differential diagnosis would also included  inflammatory response or fungal infection.  2. Two additional foci of uptake while within the right upper lobe  and inferior lingula without clear measurable disease on the CT  portion. Within this pattern may suggest that the pulmonary  parenchymal findings are an inflammatory response or potentially a  fungal infection.  3. No additional evidence of metastatic disease on the whole-body  scan  Plan -  FOB with ENB for LUL biopsies. All questions answered, pt ready to proceed.   Baltazar Apo, MD, PhD 11/26/2013, 7:29 AM [facility] [phone] or if no answer [phone]

## 2013-11-26 NOTE — Op Note (Signed)
Video Bronchoscopy with Electromagnetic Navigation Procedure Note  Date of Operation: 11/26/2013  Pre-op Diagnosis: LUL nodule  Post-op Diagnosis: same  Surgeon: Baltazar Apo  Assistants: none  Anesthesia: General endotracheal anesthesia  Operation: Flexible video fiberoptic bronchoscopy with electromagnetic navigation and biopsies.  Estimated Blood Loss: 16XW  Complications: none apparent  Indications and History: Jamie [name] is a 68 y.o. female with a hx of breast cancer, tobacco use.  Recommendation was made to pursue tissue diagnosis via navigational bronchoscopy. The risks, benefits, complications, treatment options and expected outcomes were discussed with the patient.  The possibilities of pneumothorax, pneumonia, reaction to medication, pulmonary aspiration, perforation of a viscus, bleeding, failure to diagnose a condition and creating a complication requiring transfusion or operation were discussed with the patient who freely signed the consent.    Description of Procedure: The patient was seen in the Preoperative Area, was examined and was deemed appropriate to proceed.  The patient was taken to OR10, identified as [name] and the procedure verified as Flexible Video Fiberoptic Bronchoscopy.  A Time Out was held and the above information confirmed.   Prior to the date of the procedure a high-resolution CT scan of the chest was performed. Utilizing [employer] a virtual tracheobronchial tree was generated to allow the creation of distinct navigation pathways to the patient's LUL parenchymal abnormality. General anesthesia was initiated and the patient  was orally intubated. The video fiberoptic bronchoscope was introduced via the endotracheal tube and a general inspection was performed which showed normal airways throughout. The extendable working channel and locator guide were introduced into the bronchoscope. The distinct navigation pathway prepared prior to  this procedure was then utilized to navigate to within 0.7cm of patient's lesion identified on CT scan. The extendable working channel was secured into place and the locator guide was withdrawn. Under fluoroscopic guidance transbronchial needle brushings, transbronchial Wang needle biopsies, and transbronchial forceps biopsies were performed to be sent for cytology and pathology. A bronchioalveolar lavage was performed in the LUL and sent for cytology and microbiology (fungal, AFB smears and cultures). At the end of the procedure a general airway inspection was performed and there was no evidence of active bleeding. The bronchoscope was removed.  The patient tolerated the procedure well. There was no significant blood loss and there were no obvious complications. A post-procedural chest x-ray is pending.  Samples: 1. Transbronchial needle brushings from LUL nodule 2. Transbronchial Wang needle biopsies from LUL nodule 3. Transbronchial forceps biopsies from LUL nodule 4. Bronchoalveolar lavage from LUL  Plans:  The patient will be discharged from the PACU to home when recovered from anesthesia and after chest x-ray is reviewed. We will review the cytology, pathology and microbiology results with the patient when they become available. Outpatient followup will be with Dr Lamonte Sakai or Dr Elsworth Soho.    Baltazar Apo, MD, PhD 11/26/2013, 9:36 AM [facility] [phone] or if no answer [phone]

## 2013-11-30 ENCOUNTER — Encounter (HOSPITAL_COMMUNITY): Payer: Self-pay | Admitting: Emergency Medicine

## 2013-11-30 ENCOUNTER — Telehealth: Payer: Self-pay | Admitting: Internal Medicine

## 2013-11-30 NOTE — Telephone Encounter (Signed)
Pr returned call.

## 2013-11-30 NOTE — Telephone Encounter (Signed)
lmomtcb x1 

## 2013-11-30 NOTE — Telephone Encounter (Signed)
Spoke with patient-she is aware that MR is out at this time and not back in our office until Monday 12-06-13; however RB did the test and is available;I am sending the phone note to both MR and RB so that 1 of them can give results as the patient was told to call here today for results and I am unable to give to her at this time. Pt is anxious to know asap.    Please advise. Thanks.

## 2013-12-01 NOTE — Telephone Encounter (Signed)
Pt is aware of results. Already has an appointment with RB on 12/21/13. Nothing further was needed.

## 2013-12-01 NOTE — Telephone Encounter (Signed)
AFter she sees RB 12/21/13, recommend she follow with Dr Elsworth Soho whose patient she is  ccL to both Dr Elsworth Soho and Lamonte Sakai

## 2013-12-01 NOTE — Telephone Encounter (Signed)
Please let the patient know that her biopsies and washings do NOT show any cancer cells. Her culture results are still pending. We will need to follow up (either with MR, RA or myself) to decide if we need to pursue another kind of biopsy now or whether to wait and follow her CT scan for changes.

## 2013-12-02 NOTE — Telephone Encounter (Signed)
I have not seen this pt for this problem , so defer to Drs Lamonte Sakai & MR for final recommendations Am happy to see her in FU in future

## 2013-12-21 ENCOUNTER — Encounter: Payer: Self-pay | Admitting: Emergency Medicine

## 2013-12-21 ENCOUNTER — Ambulatory Visit (INDEPENDENT_AMBULATORY_CARE_PROVIDER_SITE_OTHER): Payer: Medicare Other | Admitting: Emergency Medicine

## 2013-12-21 VITALS — HR 63 | Ht 65.5 in | Wt 251.0 lb

## 2013-12-21 DIAGNOSIS — R911 Solitary pulmonary nodule: Secondary | ICD-10-CM

## 2013-12-21 DIAGNOSIS — I6529 Occlusion and stenosis of unspecified carotid artery: Secondary | ICD-10-CM

## 2013-12-21 NOTE — Assessment & Plan Note (Signed)
bx's are negative but suspicion for malignancy remains high. I have discussed options with her including VATS and resection vs following CT scans. She would like to discuss with dr Griffith Citron. If she wants to pursue bx then surgical approach would be the next move.

## 2013-12-21 NOTE — Patient Instructions (Signed)
We will arrange for you to see Dr Griffith Citron soon to discuss our biopsy results and the potential next steps.  Our options include referral for a surgical biopsy of your left lung OR following you over several months with a repeat CT scan to see if the lesion changes.  Follow with Dr Lamonte Sakai in 1 month

## 2013-12-21 NOTE — Progress Notes (Signed)
Subjective:    Patient ID: Jamie [name], female    DOB: 1946/06/04, 68 y.o.   MRN: [id number]  HPI PCP- Nira Conn Spry  68 year old ex-smoker presents for evaluation of excessive daytime fatigue and dyspnea. She underwent bilateral mastectomy for left breast cancer. This did involve a lymph node, unfortunately her first chemotherapy was complicated by an acute myocardial infarction. She subsequently required CABG in June 2013. Preop echo showed normal ejection fraction and she did recover but never fully he came her activity level. She was unable to participate in rehabilitation and instead chose to use her daughter swimming pool for exercise. Had LAP-BAND surgery in 2009 and her weight decreased from 280-2-30 pounds, but she has regained to 265 now. Prior to lap band surgery, she had PFTs that showed moderate restriction with FVC of 66%, FEV1 of 64% and a ratio 76 with DLCO 56%.  Spirometry today shows unchanged prominent function with FEV1 of 67% and FVC of 71% with a ratio 73, again no evidence of airway obstruction. She did smoke about 80 pack years before she quit in 2009.  Overnight polysomnogram in November 2009 showed mild obstructive sleep apnea with AHI of 11 events per hour with severe oxygen desaturation to 67%.  She spent 80 minutes with a saturation less than 88%. 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 She did not desaturate on walking   Referred by Dr. Jana Hakim for evaluation of left upper lobe mass  She is morbidly obese. She is ex heavy smoker 80 pack history quit 7 years ago. She is a diagnosis of breast cancer on the left breast from a few years ago. She status post bilateral mastectomies for the same. First chemotherapy was compensated by my card he'll infarction for which he underwent quadruple bypass according to her history. Since then she's had no more chemotherapy and is in complete remission.  From a  respiratory standpoint she is essentially asymptomatic. CT scan of the chest in May 2013 showed some mild left upper lobe platelike atelectasis. Currently on followup CT scan of the chest 10/28/2013 this is much enlarged and is active on PET scan which is 4 cm craniocaudal dimension and 1 cm answers. It is active on PET scan. There no other PET scan uptake areas. It appears as an airway leading up to this mass.  Of note, the last 1 or 2 days she's had bronchitis symptoms with cough and congestion. This is after she got exposed to her grandson who is sick with respiratory infection. She's asking for antibiotics for the same  ROV 12/21/13 -- follows up s/p bronchoscopic biopsies and ENB for PET-positive LUL nodule. All the biopsies were negative. She is doing well post-procedure. She has been experiencing cough, has been treated with abx for bronchitis twice in the last 6 months.    CT chest 10/28/13  COMPARISON: NM PET IMAGE RESTAG (PS) SKULL BASE TO THIGH dated  10/28/2013; CT CHEST W/CM dated 09/10/2011; CT ANGIO CHEST W/CM &/OR  WO/CM dated 12/10/2011  FINDINGS:  Bilateral mastectomies with a submuscular implants. No axillary  adenopathy. No supraclavicular or internal mammary adenopathy. No  mediastinal adenopathy.  Within the left upper lobe there is a new elongated focus of  consolidation which measures 13 by 10 mm in axial dimension (image  14, series 5 and 40 mm in craniocaudad dimension. On the comparison  exam from 12/10/2011 there is a similar  pattern albeit smaller and  more medial measuring 32 by 4 x 10 mm. . Airways are normal.  Review of the upper abdomen demonstrates normal adrenal glands.  There is diffuse low-attenuation in the liver. Gastric banding  device noted. No aggressive osseous lesion.  IMPRESSION:  1. The linear elongated focus of consolidation in the medial left  upper lobe is increased in size compared to prior exam. Due to  metabolic activity on comparison exam  recommend either percutaneous  sampling or bronchoscopy of for sampling of this atypical lesion.  2. No additional evidence of metastatic disease.  Electronically Signed  By: Suzy Bouchard M.D.  On: 10/28/2013 14:01    PET Scan 10/28/13  No focal hypermetabolic activity to suggest skeletal metastasis.  IMPRESSION:  1. Intense hypermetabolic activity associated with linear nodular  thickening in the left upper lobe. Although this is atypical  presentation for breast cancer metastasis, due to the intense  metabolic activity recommend either percutaneous sampling or  bronchoscopic sampling. Differential diagnosis would also included  inflammatory response or fungal infection.  2. Two additional foci of uptake while within the right upper lobe  and inferior lingula without clear measurable disease on the CT  portion. Within this pattern may suggest that the pulmonary  parenchymal findings are an inflammatory response or potentially a  fungal infection.  3. No additional evidence of metastatic disease on the whole-body  scan.  Electronically Signed  By: Suzy Bouchard M.D.  On: 10/28/2013 13:59  Past Medical History  Diagnosis Date  . Asthma   . COPD (chronic obstructive pulmonary disease)   . UTI (lower urinary tract infection)     freq-bladder implant -removed  . Hx of migraines   . CAD (coronary artery disease)     NSTEMI 5/13:  LHC demonstrated oLM 75%, pLAD 80%, RCA occluded, filled by collaterals from the LAD.  Echo 12/10/11: EF 60%.; CABG 12/13/11: LIMA-LAD, SVG-RCA, SVG-diagonal and OM.   Marland Kitchen HLD (hyperlipidemia)   . Carotid stenosis     Pre-CABG Dopplers 5/13: Bilateral 40-59%  . Myocardial infarction   . Obesity   . Sleep apnea     sleep study2-3 yrs ago lost weight afterward but gained back  . Breast cancer 08/2011    lt. breast ca  . DM2 (diabetes mellitus, type 2)     diet controlled  . Shortness of breath   . Anxiety   . Hypertension      Family History   Problem Relation Age of Onset  . Anesthesia problems Neg Hx   . Hypotension Neg Hx   . Malignant hyperthermia Neg Hx   . Pseudochol deficiency Neg Hx   . Heart disease Mother   . Breast cancer Maternal Aunt   . Ovarian cancer Paternal Aunt   . Breast cancer Maternal Aunt   . Heart disease Father      History   Social History  . Marital Status: Married    Spouse Name: N/A    Number of Children: 6  . Years of Education: N/A   Occupational History  . retired    Social History Main Topics  . Smoking status: Former Smoker -- 2.00 packs/day for 40 years    Types: Cigarettes    Quit date: 08/25/2006  . Smokeless tobacco: Never Used  . Alcohol Use: Yes     Comment: 1-2 times a year  . Drug Use: No  . Sexual Activity: Not Currently   Other Topics Concern  . Not on file  Social History Narrative  . No narrative on file     Allergies  Allergen Reactions  . Vicodin [Hydrocodone-Acetaminophen] Other (See Comments)    syncope  . Sulfa Antibiotics Other (See Comments)    Unknown; childhood allergy     Outpatient Prescriptions Prior to Visit  Medication Sig Dispense Refill  . anastrozole (ARIMIDEX) 1 MG tablet Take 1 tablet (1 mg total) by mouth daily.  90 tablet  3  . aspirin 325 MG EC tablet Take 325 mg by mouth daily.      Marland Kitchen atorvastatin (LIPITOR) 80 MG tablet Take 1 tablet (80 mg total) by mouth daily at 6 PM.  90 tablet  3  . buPROPion (WELLBUTRIN XL) 150 MG 24 hr tablet Take 300 mg by mouth daily.       . fluticasone (FLONASE) 50 MCG/ACT nasal spray Place 2 sprays into both nostrils daily.       Marland Kitchen lisinopril (PRINIVIL,ZESTRIL) 10 MG tablet TAKE ONE TABLET BY MOUTH ONCE DAILY  90 tablet  0  . loratadine (CLARITIN) 10 MG tablet Take 10 mg by mouth daily.      . metFORMIN (GLUCOPHAGE) 500 MG tablet Take 1 tablet by mouth 2 (two) times daily.      . metoprolol (LOPRESSOR) 50 MG tablet Take 1 tablet (50 mg total) by mouth 2 (two) times daily.  180 tablet  3  .  nitrofurantoin, macrocrystal-monohydrate, (MACROBID) 100 MG capsule Take 1 capsule by mouth daily.      . Nutritional Supplements (JUICE PLUS FIBRE PO) Take 1 tablet by mouth 2 (two) times daily.      . trospium (SANCTURA) 20 MG tablet Take 20 mg by mouth at bedtime.       Marland Kitchen levofloxacin (LEVAQUIN) 500 MG tablet Take 1 tablet (500 mg total) by mouth daily.  5 tablet  0  . lisinopril (PRINIVIL,ZESTRIL) 10 MG tablet Take 10 mg by mouth daily.       No facility-administered medications prior to visit.      Review of Systems  Constitutional: Negative for fever and unexpected weight change.  HENT: Negative for congestion, dental problem, ear pain, nosebleeds, postnasal drip, rhinorrhea, sinus pressure, sneezing, sore throat and trouble swallowing.   Eyes: Negative for redness and itching.  Respiratory: Positive for cough and shortness of breath. Negative for chest tightness and wheezing.   Cardiovascular: Negative for palpitations and leg swelling.  Gastrointestinal: Negative for nausea and vomiting.  Genitourinary: Negative for dysuria.  Musculoskeletal: Negative for joint swelling.  Skin: Negative for rash.  Neurological: Negative for headaches.  Hematological: Does not bruise/bleed easily.  Psychiatric/Behavioral: Negative for dysphoric mood. The patient is not nervous/anxious.        Objective:   Physical Exam  Vitals reviewed. Constitutional: She is oriented to person, place, and time. She appears well-developed and well-nourished. No distress.  HENT:  Head: Normocephalic and atraumatic.  Right Ear: External ear normal.  Left Ear: External ear normal.  Mouth/Throat: Oropharynx is clear and moist. No oropharyngeal exudate.  Eyes: Conjunctivae and EOM are normal. Pupils are equal, round, and reactive to light. Right eye exhibits no discharge. Left eye exhibits no discharge. No scleral icterus.  Neck: Normal range of motion. Neck supple. No JVD present. No tracheal deviation  present. No thyromegaly present.  Cardiovascular: Normal rate, regular rhythm, normal heart sounds and intact distal pulses.  Exam reveals no gallop and no friction rub.   No murmur heard. Pulmonary/Chest: Effort normal and breath sounds normal. No  respiratory distress. She has no wheezes. She has no rales. She exhibits no tenderness.  Abdominal: Soft. Bowel sounds are normal. She exhibits no distension and no mass. There is no tenderness. There is no rebound and no guarding.  Musculoskeletal: Normal range of motion. She exhibits no edema and no tenderness.  Lymphadenopathy:    She has no cervical adenopathy.  Neurological: She is alert and oriented to person, place, and time. She has normal reflexes. No cranial nerve deficit. She exhibits normal muscle tone. Coordination normal.  Skin: Skin is warm and dry. No rash noted. She is not diaphoretic. No erythema. No pallor.  Psychiatric: She has a normal mood and affect. Her behavior is normal. Judgment and thought content normal.          Assessment & Plan:  Nodule of left lung bx's are negative but suspicion for malignancy remains high. I have discussed options with her including VATS and resection vs following CT scans. She would like to discuss with dr Griffith Citron. If she wants to pursue bx then surgical approach would be the next move.

## 2013-12-23 ENCOUNTER — Telehealth: Payer: Self-pay | Admitting: Oncology

## 2013-12-23 ENCOUNTER — Telehealth: Payer: Self-pay | Admitting: Emergency Medicine

## 2013-12-23 LAB — FUNGUS CULTURE W SMEAR: FUNGAL SMEAR: NONE SEEN

## 2013-12-23 NOTE — Telephone Encounter (Signed)
Myrtle left a VM stating that Dr. Jana Hakim is aware of Referral by Dr. Lamonte Sakai.  Dr. Jana Hakim will review patient's records & per [facility] he will call the patient personally to talk with her.  I have called & let the patient know Jamie [name]

## 2013-12-23 NOTE — Telephone Encounter (Signed)
pt cld to see when appt was sch/adv it was in Oct-Dr Byrum was in [place] stated that she needed to come b4 that-adv pt that I would trans to [place]

## 2013-12-23 NOTE — Telephone Encounter (Signed)
Late Entry:  Received call from Dawn/Dr. Lamonte Sakai 12/21/13 asking for referral to see Dr. Jana Hakim ASAP post video bronc to discuss how to proceed. Will discuss with Dr Jana Hakim today & let Dawn know this.

## 2013-12-23 NOTE — Telephone Encounter (Signed)
I called Jamie [name] w/Dr. Jana Hakim to see if she had a status update concerning Jamie [name]'s appt.  I got her VM & left a message asking her to call me today because the patient is calling us concerning this.  I then called the Jamie [name] to let her know that I had left VM for [facility] & I will wait to hear back from her Jamie [name]

## 2013-12-28 ENCOUNTER — Other Ambulatory Visit: Payer: Self-pay | Admitting: *Deleted

## 2013-12-28 ENCOUNTER — Telehealth: Payer: Self-pay | Admitting: *Deleted

## 2013-12-28 NOTE — Telephone Encounter (Signed)
Received call from Dawn/Dr. Velora Heckler regarding pt.  Returned call & left message that Dr. Jana Hakim was going to call pt & I have moved reminder to do this to the top of his pile.

## 2013-12-29 ENCOUNTER — Other Ambulatory Visit (HOSPITAL_COMMUNITY): Payer: Self-pay | Admitting: *Deleted

## 2013-12-29 DIAGNOSIS — I6529 Occlusion and stenosis of unspecified carotid artery: Secondary | ICD-10-CM

## 2014-01-02 ENCOUNTER — Other Ambulatory Visit: Payer: Self-pay | Admitting: Oncology

## 2014-01-02 DIAGNOSIS — C50919 Malignant neoplasm of unspecified site of unspecified female breast: Secondary | ICD-10-CM

## 2014-01-03 ENCOUNTER — Telehealth: Payer: Self-pay | Admitting: *Deleted

## 2014-01-04 ENCOUNTER — Telehealth: Payer: Self-pay | Admitting: Oncology

## 2014-01-04 ENCOUNTER — Ambulatory Visit (HOSPITAL_BASED_OUTPATIENT_CLINIC_OR_DEPARTMENT_OTHER): Payer: Medicare Other | Admitting: *Deleted

## 2014-01-04 DIAGNOSIS — I6529 Occlusion and stenosis of unspecified carotid artery: Secondary | ICD-10-CM

## 2014-01-04 NOTE — Telephone Encounter (Signed)
[facility] WILL CALL PT RE CT. NO OTHER ORDERS PER 5/31 POF.

## 2014-01-04 NOTE — Progress Notes (Signed)
Carotid duplex complete 

## 2014-01-05 ENCOUNTER — Ambulatory Visit (HOSPITAL_COMMUNITY)
Admission: RE | Admit: 2014-01-05 | Discharge: 2014-01-05 | Disposition: A | Discharge status: Home / Self Care | Payer: Medicare Other | Source: Ambulatory Visit | Attending: Oncology | Admitting: Oncology

## 2014-01-05 DIAGNOSIS — Z951 Presence of aortocoronary bypass graft: Secondary | ICD-10-CM | POA: Insufficient documentation

## 2014-01-05 DIAGNOSIS — C50919 Malignant neoplasm of unspecified site of unspecified female breast: Secondary | ICD-10-CM

## 2014-01-05 DIAGNOSIS — J9819 Other pulmonary collapse: Secondary | ICD-10-CM | POA: Insufficient documentation

## 2014-01-05 DIAGNOSIS — R918 Other nonspecific abnormal finding of lung field: Secondary | ICD-10-CM | POA: Insufficient documentation

## 2014-01-05 DIAGNOSIS — Z9010 Acquired absence of unspecified breast and nipple: Secondary | ICD-10-CM | POA: Insufficient documentation

## 2014-01-05 MED ORDER — IOHEXOL 300 MG/ML  SOLN
100.0000 mL | Freq: Once | INTRAMUSCULAR | Status: AC | PRN
  Administered 2014-01-05: 100 mL via INTRAVENOUS

## 2014-01-06 NOTE — Progress Notes (Signed)
Quick Note:  LMTCB 6/4 ______

## 2014-01-07 ENCOUNTER — Other Ambulatory Visit: Payer: Self-pay | Admitting: Oncology

## 2014-01-07 DIAGNOSIS — C50919 Malignant neoplasm of unspecified site of unspecified female breast: Secondary | ICD-10-CM

## 2014-01-07 NOTE — Progress Notes (Unsigned)
Called Jamie [name] w results of her Chest CT 6/3-- the lesins we have been following remains entirely unchanged. Instead of proceeding to Bx we are going to repeat a Ct in OCT. She is very happy with this plan

## 2014-01-10 LAB — AFB CULTURE WITH SMEAR (NOT AT ARMC): Acid Fast Smear: NONE SEEN

## 2014-01-16 ENCOUNTER — Other Ambulatory Visit: Payer: Self-pay | Admitting: Oncology

## 2014-01-19 ENCOUNTER — Ambulatory Visit: Payer: Medicare Other | Admitting: Emergency Medicine

## 2014-03-02 ENCOUNTER — Other Ambulatory Visit: Payer: Self-pay | Admitting: Cardiology

## 2014-04-22 ENCOUNTER — Telehealth: Payer: Self-pay | Admitting: Emergency Medicine

## 2014-04-22 NOTE — Telephone Encounter (Signed)
Patient called with concerns regarding follow up on " the spot on her lung". Gave patient dates and times for upcoming labs, CT and office visit with Dr Jana Hakim. Instructed patient to have only liquids 4 hours prior to her CT. Patient verbalized understanding. Advised patient to call with any further questions or concerns.

## 2014-05-09 ENCOUNTER — Other Ambulatory Visit: Payer: Self-pay | Admitting: Emergency Medicine

## 2014-05-09 DIAGNOSIS — C50919 Malignant neoplasm of unspecified site of unspecified female breast: Secondary | ICD-10-CM

## 2014-05-09 DIAGNOSIS — N6320 Unspecified lump in the left breast, unspecified quadrant: Secondary | ICD-10-CM

## 2014-05-10 ENCOUNTER — Encounter (HOSPITAL_COMMUNITY): Payer: Self-pay

## 2014-05-10 ENCOUNTER — Other Ambulatory Visit (HOSPITAL_BASED_OUTPATIENT_CLINIC_OR_DEPARTMENT_OTHER): Payer: Medicare Other

## 2014-05-10 ENCOUNTER — Ambulatory Visit (HOSPITAL_COMMUNITY)
Admission: RE | Admit: 2014-05-10 | Discharge: 2014-05-10 | Disposition: A | Discharge status: Home / Self Care | Payer: Medicare Other | Source: Ambulatory Visit | Attending: Oncology | Admitting: Oncology

## 2014-05-10 DIAGNOSIS — C50919 Malignant neoplasm of unspecified site of unspecified female breast: Secondary | ICD-10-CM

## 2014-05-10 DIAGNOSIS — R911 Solitary pulmonary nodule: Secondary | ICD-10-CM | POA: Diagnosis not present

## 2014-05-10 DIAGNOSIS — C50912 Malignant neoplasm of unspecified site of left female breast: Secondary | ICD-10-CM | POA: Insufficient documentation

## 2014-05-10 DIAGNOSIS — Z9221 Personal history of antineoplastic chemotherapy: Secondary | ICD-10-CM | POA: Diagnosis not present

## 2014-05-10 DIAGNOSIS — N6320 Unspecified lump in the left breast, unspecified quadrant: Secondary | ICD-10-CM

## 2014-05-10 LAB — COMPREHENSIVE METABOLIC PANEL (CC13)
ALBUMIN: 3.5 g/dL (ref 3.5–5.0)
ALT: 18 U/L (ref 0–55)
ANION GAP: 8 meq/L (ref 3–11)
AST: 17 U/L (ref 5–34)
Alkaline Phosphatase: 81 U/L (ref 40–150)
BUN: 14.6 mg/dL (ref 7.0–26.0)
CHLORIDE: 109 meq/L (ref 98–109)
CO2: 26 meq/L (ref 22–29)
Calcium: 9.7 mg/dL (ref 8.4–10.4)
Creatinine: 0.9 mg/dL (ref 0.6–1.1)
GLUCOSE: 93 mg/dL (ref 70–140)
POTASSIUM: 4.2 meq/L (ref 3.5–5.1)
SODIUM: 143 meq/L (ref 136–145)
Total Bilirubin: 0.41 mg/dL (ref 0.20–1.20)
Total Protein: 7.1 g/dL (ref 6.4–8.3)

## 2014-05-10 LAB — CBC WITH DIFFERENTIAL/PLATELET
BASO%: 0.3 % (ref 0.0–2.0)
Basophils Absolute: 0 10*3/uL (ref 0.0–0.1)
EOS ABS: 0.1 10*3/uL (ref 0.0–0.5)
EOS%: 1.3 % (ref 0.0–7.0)
HCT: 41.2 % (ref 34.8–46.6)
HGB: 13.1 g/dL (ref 11.6–15.9)
LYMPH#: 1.6 10*3/uL (ref 0.9–3.3)
LYMPH%: 25 % (ref 14.0–49.7)
MCH: 28.7 pg (ref 25.1–34.0)
MCHC: 31.8 g/dL (ref 31.5–36.0)
MCV: 90.2 fL (ref 79.5–101.0)
MONO#: 0.5 10*3/uL (ref 0.1–0.9)
MONO%: 7.8 % (ref 0.0–14.0)
NEUT#: 4.1 10*3/uL (ref 1.5–6.5)
NEUT%: 65.6 % (ref 38.4–76.8)
Platelets: 162 10*3/uL (ref 145–400)
RBC: 4.57 10*6/uL (ref 3.70–5.45)
RDW: 13.7 % (ref 11.2–14.5)
WBC: 6.3 10*3/uL (ref 3.9–10.3)

## 2014-05-10 MED ORDER — IOHEXOL 300 MG/ML  SOLN
80.0000 mL | Freq: Once | INTRAMUSCULAR | Status: AC | PRN
  Administered 2014-05-10: 80 mL via INTRAVENOUS

## 2014-05-13 ENCOUNTER — Other Ambulatory Visit: Payer: Self-pay | Admitting: Cardiology

## 2014-05-17 ENCOUNTER — Telehealth: Payer: Self-pay | Admitting: Oncology

## 2014-05-17 ENCOUNTER — Ambulatory Visit (HOSPITAL_BASED_OUTPATIENT_CLINIC_OR_DEPARTMENT_OTHER): Payer: Medicare Other | Admitting: Oncology

## 2014-05-17 VITALS — BP 133/79 | HR 75 | Temp 98.5°F | Resp 20 | Ht 65.5 in | Wt 243.9 lb

## 2014-05-17 DIAGNOSIS — C50912 Malignant neoplasm of unspecified site of left female breast: Secondary | ICD-10-CM

## 2014-05-17 DIAGNOSIS — Z853 Personal history of malignant neoplasm of breast: Secondary | ICD-10-CM

## 2014-05-17 DIAGNOSIS — R911 Solitary pulmonary nodule: Secondary | ICD-10-CM

## 2014-05-17 NOTE — Progress Notes (Signed)
ID: NALLELY YOST   DOB: [birth year]  MR#: [id number]  DXA#:[id number]   PCP: Verdell Carmine., MD GYN: SURGAutumn Messing, MD;  Theodoro Kos, DO OTHER:  Loralie Champagne, MD;  Lance Morin, MD, Baltazar Apo MD  CHIEF COMPLAINT:  Estrogen receptor positive breast cancer CURRENT TREATMENT: Anastrozole  HISTORY OF PRESENT ILLNESS: From the original intake note:  The patient noted a mass in her left breast and brought it to the attention of her primary physician, Dr. Ward Givens, who set her up for diagnostic mammography. This showed a mass of about 3 cm in diameter (I do not have a copy of that study or comparison with prior mammography from July of 2012). The patient was referred to Dr. Marlou Starks and he set her up for biopsy of the mass 08/28/2011 [facility]. The pathology showed [id number]) an invasive ductal carcinoma, grade 2, estrogen receptor 100% positive, progesterone receptor 96% positive, with an MIB-1 of 30%, and HER-2 equivocal with the ratio by CISH of 1.81.  Diagnostic right mammography was then performed 08/28/2011. Multiple masses were noted, and there were multiple soft tissue nodules by palpation. Ultrasound specifically showed 2 hypoechoic round masses measuring 1.3 and 0.9 cm respectively. 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 With this information and after extensive discussion the patient opted for bilateral mastectomies with left sentinel lymph node sampling, and this was performed 10/18/2011.  Her subsequent history is as detailed below  INTERVAL HISTORY: Laquashia returns today for followup of her left breast carcinoma.  After her last visit here she was referred to pulmonary proceeded to biopsy her left upper lobe nodule. This was done 11/26/2013 and showed ([id number]) benign lung tissue with no atypia or malignancy. Nevertheless there was a real concern that this was a discordant result and the suggestion  was strongly made that the patient proceed to surgery. She opted for further followup. She just had a CT of the chest which shows the mass is certainly no greater, perhaps more "scarlike" than before.  REVIEW OF SYSTEMS: Rosela is "feeling better" overall. Although she is retired she is very busy as her daughter and her daughter's 3 children live with her. She does a lot of driving around. She walks to and from the soccer fields where she drives one of her grandchildren, and of course in stores, which is not in a steady exercise program. She describes herself is severely fatigued. She continues to have problems with poor urine control and occasional urinary tract infections. Her diabetes is not well-controlled. A detailed review of systems today was otherwise noncontributory  PAST MEDICAL HISTORY: Past Medical History  Diagnosis Date  . Asthma   . COPD (chronic obstructive pulmonary disease)   . UTI (lower urinary tract infection)     freq-bladder implant -removed  . Hx of migraines   . CAD (coronary artery disease)     NSTEMI 5/13:  LHC demonstrated oLM 75%, pLAD 80%, RCA occluded, filled by collaterals from the LAD.  Echo 12/10/11: EF 60%.; CABG 12/13/11: LIMA-LAD, SVG-RCA, SVG-diagonal and OM.   Marland Kitchen HLD (hyperlipidemia)   . Carotid stenosis     Pre-CABG Dopplers 5/13: Bilateral 40-59%  . Myocardial infarction   . Obesity   . Sleep apnea     sleep study2-3 yrs ago lost weight afterward but gained back  . Shortness of breath   . Anxiety   . Hypertension   . Breast cancer 08/2011  lt. breast ca  . DM2 (diabetes mellitus, type 2)     diet controlled  hx tobacco abuse, 80 P/Y, resolved  PAST SURGICAL HISTORY: Past Surgical History  Procedure Laterality Date  . Cesarean section  04/1976  . Abdominal hysterectomy  1977  . Laparoscopic gastric banding  2010  . Nasal sinus surgery    . Tonsillectomy    . Appendectomy    . Mastectomy w/ sentinel node biopsy  10/18/2011    Procedure:  MASTECTOMY WITH SENTINEL LYMPH NODE BIOPSY;  Surgeon: Merrie Roof, MD;  Location: [facility];  Service: General;  Laterality: Bilateral;  bilateral mastectomy and left sentinel node biopsy  . Axillary lymph node dissection  10/18/2011    Procedure: AXILLARY LYMPH NODE DISSECTION;  Surgeon: Merrie Roof, MD;  Location: [facility];  Service: General;  Laterality: Left;  . Breast reconstruction  10/18/2011    Procedure: BREAST RECONSTRUCTION;  Surgeon: Theodoro Kos, DO;  Location: [facility];  Service: Plastics;  Laterality: Bilateral;   bilateral breast reconstruction with bilateral tissue expander and placement of flex hd.  . US echocardiography      at [facility]  . Portacath placement  12/02/2011    Procedure: INSERTION PORT-A-CATH;  Surgeon: Merrie Roof, MD;  Location: [facility];  Service: General;  Laterality: Right;  . Coronary artery bypass graft  12/13/2011    Procedure: CORONARY ARTERY BYPASS GRAFTING (CABG);  Surgeon: Gaye Pollack, MD;  Location: [facility];  Service: Open Heart Surgery;  Laterality: N/A;  Times four using endoscopically harvested left greater saphenous vein and left internal mammary artery. Right greater saphenous vein attempted; not appropriate for vein harvest.  . Lesion removal  04/09/2012    Procedure: LESION REMOVAL;  Surgeon: Theodoro Kos, DO;  Location: [facility];  Service: Plastics;  Laterality: Bilateral;  . Port-a-cath removal  04/09/2012    Procedure: REMOVAL PORT-A-CATH;  Surgeon: Merrie Roof, MD;  Location: [facility];  Service: General;  Laterality: Right;  . Video bronchoscopy with endobronchial navigation Left 11/26/2013    Procedure: VIDEO BRONCHOSCOPY WITH ENDOBRONCHIAL NAVIGATION;  Surgeon: Collene Gobble, MD;  Location[facility];  Service: Thoracic;  Laterality: Left;  no salpingo-oophorectomy  FAMILY HISTORY Family History  Problem Relation Age of Onset  . Anesthesia problems Neg Hx   . Hypotension Neg Hx   . Malignant  hyperthermia Neg Hx   . Pseudochol deficiency Neg Hx   . Heart disease Mother   . Breast cancer Maternal Aunt   . Ovarian cancer Paternal Aunt   . Breast cancer Maternal Aunt   . Heart disease Father   The patient's father died at the age of 19. He had a history of asbestos exposure. The patient's mother died at the age of 13 from heart disease. The patient had one brother, no sisters. There is no breast or ovarian cancer in the immediate family but 2 of the patient's 5 maternal aunts had breast cancer (she does not know the age of diagnosis), and one of her father's sisters had ovarian cancer.  GYNECOLOGIC HISTORY: She had menarche age 89, no firm date for her menopause as she had hysterectomy remotely. She is GX P2, with a pair of twins and a daughter as her biological children as noted below. She never took hormone replacement.  SOCIAL HISTORY:  (Updated January 2015) She owns a tuxedo shop. Her husband of 34 years, Shea Evans, is retired but used to  own a shoe shop. He is currently working in [place] with one of his own sons. At home in addition to the patient and her husband is one of the patient's daughters and her 2 children, as well as one of Forrest sons. Altogether they have 6 children and 15 grandchildren and 1 great granddaughter. The patient attends [employer]    ADVANCED DIRECTIVES: Not in place.  They were given a packet of information today.  HEALTH MAINTENANCE:  (Updated January 2015) History  Substance Use Topics  . Smoking status: Former Smoker -- 2.00 packs/day for 40 years    Types: Cigarettes    Quit date: 08/25/2006  . Smokeless tobacco: Never Used  . Alcohol Use: Yes     Comment: 1-2 times a year     Colonoscopy: 2012  PAP: s/p hysterectomy  Bone density:  06/05/2012, Normal  Lipid panel:  August 2013, Dr. Harrell Lark   Allergies  Allergen Reactions  . Vicodin [Hydrocodone-Acetaminophen] Other (See Comments)    syncope  . Sulfa Antibiotics Other (See  Comments)    Unknown; childhood allergy    Current Outpatient Prescriptions  Medication Sig Dispense Refill  . anastrozole (ARIMIDEX) 1 MG tablet Take 1 tablet (1 mg total) by mouth daily.  90 tablet  3  . aspirin 325 MG EC tablet Take 325 mg by mouth daily.      Marland Kitchen atorvastatin (LIPITOR) 80 MG tablet Take 1 tablet (80 mg total) by mouth daily at 6 PM.  90 tablet  3  . buPROPion (WELLBUTRIN XL) 150 MG 24 hr tablet Take 300 mg by mouth daily.       . fluticasone (FLONASE) 50 MCG/ACT nasal spray Place 2 sprays into both nostrils daily.       Marland Kitchen lisinopril (PRINIVIL,ZESTRIL) 10 MG tablet TAKE ONE TABLET BY MOUTH ONCE DAILY  90 tablet  0  . loratadine (CLARITIN) 10 MG tablet Take 10 mg by mouth daily.      . metFORMIN (GLUCOPHAGE) 500 MG tablet Take 1 tablet by mouth 2 (two) times daily.      . metoprolol (LOPRESSOR) 50 MG tablet TAKE ONE TABLET BY MOUTH TWICE DAILY  180 tablet  0  . nitrofurantoin, macrocrystal-monohydrate, (MACROBID) 100 MG capsule Take 1 capsule by mouth daily.      . Nutritional Supplements (JUICE PLUS FIBRE PO) Take 1 tablet by mouth 2 (two) times daily.      . trospium (SANCTURA) 20 MG tablet Take 20 mg by mouth at bedtime.        No current facility-administered medications for this visit.    OBJECTIVE: Middle-aged white woman in no acute distress Filed Vitals:   05/17/14 1110  BP: 133/79  Pulse: 75  Temp: 98.5 F (36.9 C)  Resp: 20     Body mass index is 39.96 kg/(m^2).    ECOG FS: 2  Filed Weights   05/17/14 1110  Weight: 243 lb 14.4 oz (110.632 kg)   (weight was 266 pounds in August of 2014)  Sclerae unicteric, EOMs intact Oropharynx clear, teeth in good repair No cervical or supraclavicular adenopathy Lungs no rales or rhonchi, no wheezes Heart regular rate and rhythm Abd soft, obese, nontender, positive bowel sounds, no masses palpated MSK no focal spinal tenderness, no upper extremity lymphedema; Neuro: nonfocal, well oriented, appropriate  affect Breasts: Status post bilateral mastectomies. There is no evidence of chest wall recurrence. Both axillae are benign.   LAB RESULTS:   Lab Results  Component Value Date  WBC 6.3 05/10/2014   NEUTROABS 4.1 05/10/2014   HGB 13.1 05/10/2014   HCT 41.2 05/10/2014   MCV 90.2 05/10/2014   PLT 162 05/10/2014      Chemistry      Component Value Date/Time   NA 143 05/10/2014 1041   NA 142 11/23/2013 1329   K 4.2 05/10/2014 1041   K 4.3 11/23/2013 1329   CL 104 11/23/2013 1329   CO2 26 05/10/2014 1041   CO2 25 11/23/2013 1329   BUN 14.6 05/10/2014 1041   BUN 14 11/23/2013 1329   CREATININE 0.9 05/10/2014 1041   CREATININE 0.83 11/23/2013 1329      Component Value Date/Time   CALCIUM 9.7 05/10/2014 1041   CALCIUM 9.5 11/23/2013 1329   ALKPHOS 81 05/10/2014 1041   ALKPHOS 76 02/24/2012 1327   AST 17 05/10/2014 1041   AST 36 02/24/2012 1327   ALT 18 05/10/2014 1041   ALT 36* 02/24/2012 1327   BILITOT 0.41 05/10/2014 1041   BILITOT 0.4 02/24/2012 1327       STUDIES: Ct Chest W Contrast  05/10/2014   CLINICAL DATA:  History of left breast cancer diagnosed in 2013. Chemotherapy has completed. Followup lung nodule from prior CT scan.  EXAM: CT CHEST WITH CONTRAST  TECHNIQUE: Multidetector CT imaging of the chest was performed during intravenous contrast administration.  CONTRAST:  56m OMNIPAQUE IOHEXOL 300 MG/ML  SOLN  COMPARISON:  01/05/2014  FINDINGS: Chest wall: No supraclavicular or axillary lymphadenopathy or mass. There are bilateral breast prostheses and surgical changes from bilateral mastectomies. No chest wall lesions are identified. The thyroid gland is grossly normal. The bony thorax is intact.  Mediastinum:  The heart is normal in size. No pericardial effusion. Small scattered stable mediastinal and hilar lymph nodes but no mass or adenopathy. The aorta is normal in caliber. Stable atherosclerotic calcifications. Stable surgical changes from bypass surgery. The esophagus is grossly normal.   Lungs:  Stable left upper lobe lung density which has the appearance of scarring change. Is somewhat elongated and has scattered air bronchograms in it. No new or worrisome pulmonary lesions to suggest pulmonary metastatic disease. No pleural effusion.  Upper abdomen:  The gastric lap band is noted. No complicating features are demonstrated. Normal position and orientation. No findings for upper abdominal metastatic disease. There is diffuse fatty infiltration of the liver noted. Advanced atherosclerotic calcifications involving the aorta and branch vessel ostia.  IMPRESSION: 1. Stable area of probable scarring changes in the left upper lobe. Recommend continued observation. 2. No new or worrisome pulmonary nodules to suggest metastatic disease. 3. Stable gastric lap band. 4. Diffuse fatty infiltration of the liver. 5. Status post bilateral mastectomies without chest wall disease or adenopathy.   Electronically Signed   By: MKalman JewelsM.D.   On: 05/10/2014 14:05       ASSESSMENT: 68year-old [name] woman   (1)  s/p bilateral mastectomies and left axillary lymph node dissection 10/18/2011 for a left-sided pT2 pN1, stage IIB invasive ductal carcinoma, grade 2, estrogen receptor 100% and progesterone receptor 96% positive, with no HER-2 amplification (1.81, repeat 1.35), Mib-1 of 30%. The right breast showed no malignancy.  (2) Plan was to treat in the adjuvant setting with 4 dose dense cycles of doxorubicin/cyclophosphamide followed by 4 dose dense cycles of paclitaxel. Doxorubicin given by continuous infusion over 72 hours, with Neulasta given at the time of pump removal. Patient received a truncated cycle 1, the continuous infusion disconnected after proximally 6 hours  when the patient was admitted to the hospital with unstable angina.  (3)  During hospitalization, CT angio negative for PE, echo showing well-preserved EF, enzymes/ECG showing a NSTEMI, s/p cath May 7 showing left main, LAD and RCA  disease with a well-preserved ejection fraction, revascularization performed 12/13/2011 with an uneventful postoperative course  (4) the patient opted against further chemotherapy and also against post-mastectomy irradiation; she started anastrozole May 2013  (5) enlarging LUL mass noted on chest CT/ PET March 2015, biopsy 11/26/2013 showed no evidence of atypia or malignancy, but felt to be possibly discordant. Repeat chest CT 05/10/2014 shows this to be stable, "suggestive of scarring change".   PLAN:  As far as breast cancer is concerned, Paitynn is doing well, with no evidence of disease recurrence, and good tolerance of the anastrozole. She continues to be severely limited because of her multiple other comorbidities. Today I gave her a copy of the [employer] pamphlet and encouraged her to engage in a regular exercise program. If she does enroll in [employer] she will be guided to safe exercises she will be able to accomplish without injuring herself and which will increase her exercise tolerance over the next 3 months.  We are going to continue to follow the left upper lobe lesion closely. We will repeat a CT of the chest in 3 months unlikely a CT of the chest and PET scan in 6 months. She understands if we see progression the next step will be surgery.  Face has a good understanding of the overall plan. She agrees with that. She will call with any problems that may develop before her next visit here. Chauncey Cruel, MD     05/17/2014

## 2014-05-17 NOTE — Telephone Encounter (Signed)
Gave AVS for Jan 2016- [facility] will call for CT.-Amber

## 2014-05-27 ENCOUNTER — Other Ambulatory Visit: Payer: Self-pay | Admitting: Cardiology

## 2014-05-30 ENCOUNTER — Other Ambulatory Visit: Payer: Self-pay

## 2014-05-30 NOTE — Telephone Encounter (Signed)
She needs to schedule an appt with Dr Aundra Dubin to continue to refill her medications. [facility] can help arrange an appt with Dr Aundra Dubin.  Once she has an appt, she can be given enough refills to last until her appt with Dr Aundra Dubin.

## 2014-06-19 ENCOUNTER — Other Ambulatory Visit: Payer: Self-pay | Admitting: Oncology

## 2014-07-13 ENCOUNTER — Telehealth: Payer: Self-pay | Admitting: Oncology

## 2014-07-13 NOTE — Telephone Encounter (Signed)
S/w pt advised appt chg from 1/19 (md pal) to 1/15 @ 9am (D/T per md). Pt verbalized understanding.

## 2014-07-14 ENCOUNTER — Encounter (HOSPITAL_COMMUNITY): Payer: Self-pay | Admitting: Cardiology

## 2014-08-17 ENCOUNTER — Other Ambulatory Visit (HOSPITAL_BASED_OUTPATIENT_CLINIC_OR_DEPARTMENT_OTHER): Payer: Medicare Other

## 2014-08-17 ENCOUNTER — Encounter (HOSPITAL_COMMUNITY): Payer: Self-pay

## 2014-08-17 ENCOUNTER — Ambulatory Visit (HOSPITAL_COMMUNITY)
Admission: RE | Admit: 2014-08-17 | Discharge: 2014-08-17 | Disposition: A | Discharge status: Home / Self Care | Payer: Medicare Other | Source: Ambulatory Visit | Attending: Oncology | Admitting: Oncology

## 2014-08-17 ENCOUNTER — Telehealth: Payer: Self-pay | Admitting: Oncology

## 2014-08-17 DIAGNOSIS — C50912 Malignant neoplasm of unspecified site of left female breast: Secondary | ICD-10-CM | POA: Insufficient documentation

## 2014-08-17 DIAGNOSIS — Z9013 Acquired absence of bilateral breasts and nipples: Secondary | ICD-10-CM | POA: Insufficient documentation

## 2014-08-17 DIAGNOSIS — R918 Other nonspecific abnormal finding of lung field: Secondary | ICD-10-CM | POA: Insufficient documentation

## 2014-08-17 DIAGNOSIS — Z853 Personal history of malignant neoplasm of breast: Secondary | ICD-10-CM

## 2014-08-17 LAB — COMPREHENSIVE METABOLIC PANEL (CC13)
ALT: 14 U/L (ref 0–55)
AST: 16 U/L (ref 5–34)
Albumin: 3.6 g/dL (ref 3.5–5.0)
Alkaline Phosphatase: 88 U/L (ref 40–150)
Anion Gap: 7 mEq/L (ref 3–11)
BILIRUBIN TOTAL: 0.51 mg/dL (ref 0.20–1.20)
BUN: 13.7 mg/dL (ref 7.0–26.0)
CO2: 27 mEq/L (ref 22–29)
Calcium: 9.2 mg/dL (ref 8.4–10.4)
Chloride: 109 mEq/L (ref 98–109)
Creatinine: 0.8 mg/dL (ref 0.6–1.1)
EGFR: 76 mL/min/{1.73_m2} — AB (ref >90)
GLUCOSE: 93 mg/dL (ref 70–140)
Potassium: 4.4 mEq/L (ref 3.5–5.1)
SODIUM: 142 meq/L (ref 136–145)
Total Protein: 7 g/dL (ref 6.4–8.3)

## 2014-08-17 LAB — CBC WITH DIFFERENTIAL/PLATELET
BASO%: 0.3 % (ref 0.0–2.0)
BASOS ABS: 0 10*3/uL (ref 0.0–0.1)
EOS%: 2.8 % (ref 0.0–7.0)
Eosinophils Absolute: 0.2 10*3/uL (ref 0.0–0.5)
HEMATOCRIT: 40.5 % (ref 34.8–46.6)
HEMOGLOBIN: 12.7 g/dL (ref 11.6–15.9)
LYMPH%: 25.4 % (ref 14.0–49.7)
MCH: 28 pg (ref 25.1–34.0)
MCHC: 31.4 g/dL — AB (ref 31.5–36.0)
MCV: 89.2 fL (ref 79.5–101.0)
MONO#: 0.6 10*3/uL (ref 0.1–0.9)
MONO%: 8.5 % (ref 0.0–14.0)
NEUT#: 4.1 10*3/uL (ref 1.5–6.5)
NEUT%: 63 % (ref 38.4–76.8)
PLATELETS: 168 10*3/uL (ref 145–400)
RBC: 4.54 10*6/uL (ref 3.70–5.45)
RDW: 13.5 % (ref 11.2–14.5)
WBC: 6.5 10*3/uL (ref 3.9–10.3)
lymph#: 1.7 10*3/uL (ref 0.9–3.3)

## 2014-08-17 MED ORDER — IOHEXOL 300 MG/ML  SOLN
80.0000 mL | Freq: Once | INTRAMUSCULAR | Status: AC | PRN
  Administered 2014-08-17: 80 mL via INTRAVENOUS

## 2014-08-17 NOTE — Telephone Encounter (Signed)
pt wanted copy of sch

## 2014-08-19 ENCOUNTER — Ambulatory Visit (HOSPITAL_BASED_OUTPATIENT_CLINIC_OR_DEPARTMENT_OTHER): Payer: Medicare Other | Admitting: Oncology

## 2014-08-19 ENCOUNTER — Telehealth: Payer: Self-pay | Admitting: Oncology

## 2014-08-19 VITALS — BP 112/83 | HR 77 | Temp 98.1°F | Resp 18 | Ht 65.5 in | Wt 245.0 lb

## 2014-08-19 DIAGNOSIS — Z170 Estrogen receptor positive status [ER+]: Secondary | ICD-10-CM

## 2014-08-19 DIAGNOSIS — C50912 Malignant neoplasm of unspecified site of left female breast: Secondary | ICD-10-CM

## 2014-08-19 DIAGNOSIS — J410 Simple chronic bronchitis: Secondary | ICD-10-CM

## 2014-08-19 DIAGNOSIS — I6523 Occlusion and stenosis of bilateral carotid arteries: Secondary | ICD-10-CM

## 2014-08-19 DIAGNOSIS — E669 Obesity, unspecified: Secondary | ICD-10-CM

## 2014-08-19 DIAGNOSIS — I2510 Atherosclerotic heart disease of native coronary artery without angina pectoris: Secondary | ICD-10-CM

## 2014-08-19 DIAGNOSIS — K760 Fatty (change of) liver, not elsewhere classified: Secondary | ICD-10-CM | POA: Insufficient documentation

## 2014-08-19 DIAGNOSIS — I5022 Chronic systolic (congestive) heart failure: Secondary | ICD-10-CM

## 2014-08-19 DIAGNOSIS — E785 Hyperlipidemia, unspecified: Secondary | ICD-10-CM

## 2014-08-19 DIAGNOSIS — I890 Lymphedema, not elsewhere classified: Secondary | ICD-10-CM

## 2014-08-19 DIAGNOSIS — G4733 Obstructive sleep apnea (adult) (pediatric): Secondary | ICD-10-CM

## 2014-08-19 DIAGNOSIS — E1165 Type 2 diabetes mellitus with hyperglycemia: Secondary | ICD-10-CM

## 2014-08-19 NOTE — Addendum Note (Signed)
Addended by: Prentiss Bells on: 08/19/2014 05:25 PM   Modules accepted: Medications

## 2014-08-19 NOTE — Progress Notes (Signed)
ID: Jamie [name]   DOB: 1946-07-24  MR#: [id number]  OFB#:[id number]   PCP: Verdell Carmine., MD GYN: SURGAutumn Messing, MD;  Theodoro Kos, DO OTHER:  Loralie Champagne, MD;  Lance Morin, MD, Baltazar Apo MD  CHIEF COMPLAINT:  Estrogen receptor positive breast cancer  CURRENT TREATMENT: Anastrozole  HISTORY OF PRESENT ILLNESS: From the original intake note:  The patient noted a mass in her left breast and brought it to the attention of her primary physician, Dr. Ward Givens, who set her up for diagnostic mammography. This showed a mass of about 3 cm in diameter (I do not have a copy of that study or comparison with prior mammography from July of 2012). The patient was referred to Dr. Marlou Starks and he set her up for biopsy of the mass 08/28/2011 [facility]. The pathology showed [phone]) an invasive ductal carcinoma, grade 2, estrogen receptor 100% positive, progesterone receptor 96% positive, with an MIB-1 of 30%, and HER-2 equivocal with the ratio by CISH of 1.81.  Diagnostic right mammography was then performed 08/28/2011. Multiple masses were noted, and there were multiple soft tissue nodules by palpation. Ultrasound specifically showed 2 hypoechoic round masses measuring 1.3 and 0.9 cm respectively. 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 With this information and after extensive discussion the patient opted for bilateral mastectomies with left sentinel lymph node sampling, and this was performed 10/18/2011.  Her subsequent history is as detailed below  INTERVAL HISTORY: Jamie [name] returns today for followup of her left breast carcinoma.  She continues on anastrozole, which she is tolerating well, with no obvious side effects. Specifically hot flashes and vaginal dryness are not major concerns. She never developed arthralgias/myalgias as some patients can experience on this drug. She also obtain sedative very reasonable  price.  REVIEW OF SYSTEMS: Jamie [name] has "good days and bad days". On bad days she feels her balance is poor. There have been no falls. She is very careful when stepping down from any position and walks with a cane when she is not holding onto someone. Her hearing is down. Occasionally she gets depressed. She tends to sleep in a recliner most nights. Of course she is not smoking. She bruises easily. She tells me she is no longer checking her sugars because they are "somewhat better". She denies chest pain or pressure. She is short of breath with activity and does not exercise regularly "except when shopping". A detailed review of systems today was otherwise stable  PAST MEDICAL HISTORY: Past Medical History  Diagnosis Date  . Asthma   . COPD (chronic obstructive pulmonary disease)   . UTI (lower urinary tract infection)     freq-bladder implant -removed  . Hx of migraines   . CAD (coronary artery disease)     NSTEMI 5/13:  LHC demonstrated oLM 75%, pLAD 80%, RCA occluded, filled by collaterals from the LAD.  Echo 12/10/11: EF 60%.; CABG 12/13/11: LIMA-LAD, SVG-RCA, SVG-diagonal and OM.   Marland Kitchen HLD (hyperlipidemia)   . Carotid stenosis     Pre-CABG Dopplers 5/13: Bilateral 40-59%  . Myocardial infarction   . Obesity   . Sleep apnea     sleep study2-3 yrs ago lost weight afterward but gained back  . Shortness of breath   . Anxiety   . Hypertension   . Breast cancer 08/2011    lt. breast ca  . DM2 (diabetes mellitus, type 2)     metformin  hx  tobacco abuse, 80 P/Y, resolved  PAST SURGICAL HISTORY: Past Surgical History  Procedure Laterality Date  . Cesarean section  04/1976  . Abdominal hysterectomy  1977  . Laparoscopic gastric banding  2010  . Nasal sinus surgery    . Tonsillectomy    . Appendectomy    . Mastectomy w/ sentinel node biopsy  10/18/2011    Procedure: MASTECTOMY WITH SENTINEL LYMPH NODE BIOPSY;  Surgeon: Merrie Roof, MD;  Location: [place];  Service: General;  Laterality:  Bilateral;  bilateral mastectomy and left sentinel node biopsy  . Axillary lymph node dissection  10/18/2011    Procedure: AXILLARY LYMPH NODE DISSECTION;  Surgeon: Merrie Roof, MD;  Location: [place];  Service: General;  Laterality: Left;  . Breast reconstruction  10/18/2011    Procedure: BREAST RECONSTRUCTION;  Surgeon: Theodoro Kos, DO;  Location: [place];  Service: Plastics;  Laterality: Bilateral;   bilateral breast reconstruction with bilateral tissue expander and placement of flex hd.  . US echocardiography      at [place]  . Portacath placement  12/02/2011    Procedure: INSERTION PORT-A-CATH;  Surgeon: Merrie Roof, MD;  Location: [place];  Service: General;  Laterality: Right;  . Coronary artery bypass graft  12/13/2011    Procedure: CORONARY ARTERY BYPASS GRAFTING (CABG);  Surgeon: Gaye Pollack, MD;  Location: [place];  Service: Open Heart Surgery;  Laterality: N/A;  Times four using endoscopically harvested left greater saphenous vein and left internal mammary artery. Right greater saphenous vein attempted; not appropriate for vein harvest.  . Lesion removal  04/09/2012    Procedure: LESION REMOVAL;  Surgeon: Theodoro Kos, DO;  Location: [place];  Service: Plastics;  Laterality: Bilateral;  . Port-a-cath removal  04/09/2012    Procedure: REMOVAL PORT-A-CATH;  Surgeon: Merrie Roof, MD;  Location: [place];  Service: General;  Laterality: Right;  . Video bronchoscopy with endobronchial navigation Left 11/26/2013    Procedure: VIDEO BRONCHOSCOPY WITH ENDOBRONCHIAL NAVIGATION;  Surgeon: Collene Gobble, MD;  Location: [place];  Service: Thoracic;  Laterality: Left;  . Left heart catheterization with coronary angiogram N/A 12/11/2011    Procedure: LEFT HEART CATHETERIZATION WITH CORONARY ANGIOGRAM;  Surgeon: Larey Dresser, MD;  Location: [facility] CATH LAB;  Service: Cardiovascular;  Laterality: N/A;  no salpingo-oophorectomy  FAMILY HISTORY Family History   Problem Relation Age of Onset  . Anesthesia problems Neg Hx   . Hypotension Neg Hx   . Malignant hyperthermia Neg Hx   . Pseudochol deficiency Neg Hx   . Heart disease Mother   . Breast cancer Maternal Aunt   . Ovarian cancer Paternal Aunt   . Breast cancer Maternal Aunt   . Heart disease Father   The patient's father died at the age of 28. He had a history of asbestos exposure. The patient's mother died at the age of 70 from heart disease. The patient had one brother, no sisters. There is no breast or ovarian cancer in the immediate family but 2 of the patient's 5 maternal aunts had breast cancer (she does not know the age of diagnosis), and one of her father's sisters had ovarian cancer.  GYNECOLOGIC HISTORY: She had menarche age 17, no firm date for her menopause as she had hysterectomy remotely. She is GX P2, with a pair of twins and a daughter as her biological children as noted below. She never took hormone replacement.  SOCIAL HISTORY:  (  Updated January 2015) She used to own a tuxedo shop. Her husband, Shea Evans, is retired but used to own a Higher education careers adviser. He is currently working in [place] with one of his own sons. At home in addition to the patient and her husband is one of the patient's daughters and her 3 children (Josh 25, Shearon Balo, and Harmon Pier 2), as well as one of Forrest sons. Altogether they have 6 children and 15 grandchildren and 1 great granddaughter. The patient attends [employer]    ADVANCED DIRECTIVES: Not in place. The patient has received advanced directives/healthcare power of attorney papers to be completed on notarized at her discretion  HEALTH MAINTENANCE:  (Updated January 2015) History  Substance Use Topics  . Smoking status: Former Smoker -- 2.00 packs/day for 40 years    Types: Cigarettes    Quit date: 08/25/2006  . Smokeless tobacco: Never Used  . Alcohol Use: Yes     Comment: 1-2 times a year     Colonoscopy: 2012  PAP: s/p hysterectomy  Bone  density:  06/05/2012, Normal  Lipid panel:  August 2013, Dr. Harrell Lark   Allergies  Allergen Reactions  . Vicodin [Hydrocodone-Acetaminophen] Other (See Comments)    syncope  . Sulfa Antibiotics Other (See Comments)    Unknown; childhood allergy    Current Outpatient Prescriptions  Medication Sig Dispense Refill  . anastrozole (ARIMIDEX) 1 MG tablet Take 1 tablet (1 mg total) by mouth daily. 90 tablet 3  . aspirin 325 MG EC tablet Take 325 mg by mouth daily.    Marland Kitchen atorvastatin (LIPITOR) 80 MG tablet Take 1 tablet (80 mg total) by mouth daily at 6 PM. 90 tablet 3  . buPROPion (WELLBUTRIN XL) 150 MG 24 hr tablet Take 300 mg by mouth daily.     . fluticasone (FLONASE) 50 MCG/ACT nasal spray Place 2 sprays into both nostrils daily.     Marland Kitchen lisinopril (PRINIVIL,ZESTRIL) 10 MG tablet TAKE ONE TABLET BY MOUTH ONCE DAILY **PATIENT  NEEDS  APPOINTMENT** 90 tablet 0  . loratadine (CLARITIN) 10 MG tablet Take 10 mg by mouth daily.    . metFORMIN (GLUCOPHAGE) 500 MG tablet Take 1 tablet by mouth 2 (two) times daily.    . metoprolol (LOPRESSOR) 50 MG tablet TAKE ONE TABLET BY MOUTH TWICE DAILY 180 tablet 0  . nitrofurantoin, macrocrystal-monohydrate, (MACROBID) 100 MG capsule Take 1 capsule by mouth daily.    . Nutritional Supplements (JUICE PLUS FIBRE PO) Take 1 tablet by mouth 2 (two) times daily.    . trospium (SANCTURA) 20 MG tablet Take 20 mg by mouth at bedtime.      No current facility-administered medications for this visit.    OBJECTIVE: Middle-aged white woman who appears stated age 69 Vitals:   08/19/14 0854  BP: 112/83  Pulse: 77  Temp: 98.1 F (36.7 C)  Resp: 18     Body mass index is 40.14 kg/(m^2).    ECOG FS: 2  Filed Weights   08/19/14 0854  Weight: 245 lb (111.131 kg)   (weight was 266 pounds in August of 2014)  Sclerae unicteric, pupils equal and reactive Oropharynx clear and moist No cervical or supraclavicular adenopathy Lungs no rales or rhonchi Heart regular rate  and rhythm Abd soft, obese, nontender, positive bowel sounds MSK no focal spinal tenderness, no upper extremity lymphedema Neuro: nonfocal, well oriented, appropriate affect Breasts: Status post bilateral mastectomies. There is no evidence of local recurrence. Both axillae are benign.   LAB RESULTS:  Lab Results  Component Value Date   WBC 6.5 08/17/2014   NEUTROABS 4.1 08/17/2014   HGB 12.7 08/17/2014   HCT 40.5 08/17/2014   MCV 89.2 08/17/2014   PLT 168 08/17/2014      Chemistry      Component Value Date/Time   NA 142 08/17/2014 0915   NA 142 11/23/2013 1329   K 4.4 08/17/2014 0915   K 4.3 11/23/2013 1329   CL 104 11/23/2013 1329   CO2 27 08/17/2014 0915   CO2 25 11/23/2013 1329   BUN 13.7 08/17/2014 0915   BUN 14 11/23/2013 1329   CREATININE 0.8 08/17/2014 0915   CREATININE 0.83 11/23/2013 1329      Component Value Date/Time   CALCIUM 9.2 08/17/2014 0915   CALCIUM 9.5 11/23/2013 1329   ALKPHOS 88 08/17/2014 0915   ALKPHOS 76 02/24/2012 1327   AST 16 08/17/2014 0915   AST 36 02/24/2012 1327   ALT 14 08/17/2014 0915   ALT 36* 02/24/2012 1327   BILITOT 0.51 08/17/2014 0915   BILITOT 0.4 02/24/2012 1327       STUDIES: Ct Chest W Contrast  08/17/2014   CLINICAL DATA:  Left breast cancer, bilateral mastectomy. Left upper lobe mass.  EXAM: CT CHEST WITH CONTRAST  TECHNIQUE: Multidetector CT imaging of the chest was performed during intravenous contrast administration.  CONTRAST:  30m OMNIPAQUE IOHEXOL 300 MG/ML  SOLN  COMPARISON:  05/10/2014, PET 10/28/2013 and CT chest 12/10/2011.  FINDINGS: Mediastinum/Nodes: No pathologically enlarged mediastinal, hilar, axillary or internal mammary lymph nodes. Surgical clips in the left axilla. Atherosclerotic calcification of the arterial vasculature. Heart size normal. No pericardial effusion.  Lungs/Pleura: A nodular density in the left upper lobe measures approximately 7 x 10 mm (previously 13 x 15 mm). There is associated  mild bronchiectasis and volume loss extending inferiorly to the left hilum, also less prominent than on 10/28/2013. Lungs are otherwise clear. No pleural fluid. Airway is unremarkable.  Upper abdomen: Visualized portion of the liver appears low in attenuation diffusely. Visualized portions of the gallbladder, adrenal glands, left kidney, spleen and pancreas are grossly unremarkable. Laparoscopic gastric banding. Visualized portions of the stomach and bowel are otherwise unremarkable. No upper abdominal adenopathy.  Musculoskeletal: No worrisome lytic or sclerotic lesions. Degenerative changes are seen in the spine.  IMPRESSION: 1. Decrease in prominence of a nodular density in the left upper lobe, as described above, when compared with 10/28/2013, favoring put post infectious scarring. Consider additional followup in 6-12 months to ensure continued stability, as clinically indicated. 2. Hepatic steatosis.   Electronically Signed   By: MLorin PicketM.D.   On: 08/17/2014 12:27     ASSESSMENT: 69y.o. [name] woman   (1)  s/p bilateral mastectomies and left axillary lymph node dissection 10/18/2011 for a left-sided pT2 pN1, stage IIB invasive ductal carcinoma, grade 2, estrogen receptor 100% and progesterone receptor 96% positive, with no HER-2 amplification (1.81, repeat 1.35), Mib-1 of 30%. The right breast showed no malignancy.  (2) Plan was to treat in the adjuvant setting with 4 dose dense cycles of doxorubicin/cyclophosphamide followed by 4 dose dense cycles of paclitaxel, with Doxorubicin given by continuous infusion over 72 hours. Patient received a truncated cycle 1, the continuous infusion disconnected after proximally 6 hours when the patient was admitted to the hospital with unstable angina.  (3)  During hospitalization, CT angio negative for PE, echo showing well-preserved EF, enzymes/ECG showing a NSTEMI, s/p cath May 7 showing left main, LAD and RCA  disease with a well-preserved ejection  fraction, revascularization performed 12/13/2011 with an uneventful postoperative course  (4) the patient opted against further chemotherapy and also against post-mastectomy irradiation; she started anastrozole May 2013  (a) bone density 06/05/2012 normal  (5) enlarging LUL mass noted on chest CT/ PET March 2015,   (a) biopsy 11/26/2013 showed no evidence of atypia or malignancy, but felt to be possibly discordant.  (b) Repeat chest CT 05/10/2014 and 08/17/2014 shows decreased prominence (post infectious scarring?)   PLAN:  We reviewed her labs and CT scan, all of which are very favorable. She is tolerating the anastrozole well. The plan is going to be to continue that for 5 years at which point she can "graduate" from follow-up here.  She will see as again in 6 months. We will repeat lab work and a chest CT at that time. If the CT continues to show improvement or stability, we will stop obtaining regular scans then area did  I was delighted that she did manage to quit smoking when all these problems started. I encouraged her to exercise everyday even if only a little bit. She will be following up on her diabetes and heart disease with her other physicians.   Jamie [name] has a good understanding of the overall plan. She agrees with it. She knows the goal of treatment in her case is cure. She will call with any problems that may develop before her next visit here. Chauncey Cruel, MD     08/19/2014

## 2014-08-19 NOTE — Telephone Encounter (Signed)
per pof to sch pt appt-gave pt copy of sch-adv CS will call to sch CT

## 2014-08-23 ENCOUNTER — Ambulatory Visit: Payer: Medicare Other | Admitting: Oncology

## 2014-08-27 ENCOUNTER — Other Ambulatory Visit: Payer: Self-pay | Admitting: Cardiology

## 2014-08-31 NOTE — Telephone Encounter (Signed)
What is the medication?  If it is a cardiology medication, she will need to schedule an appointment then we can refill until she comes in for her appointment.

## 2014-09-09 ENCOUNTER — Other Ambulatory Visit: Payer: Self-pay | Admitting: Cardiology

## 2014-09-12 IMAGING — CR DG CHEST 2V
2 series · 2 of 2 positions shown · non-contrast
Comparison: 10/28/2013.

CLINICAL DATA: Preadmission chest radiograph.

EXAM:
CHEST  2 VIEW

[w chest pa]
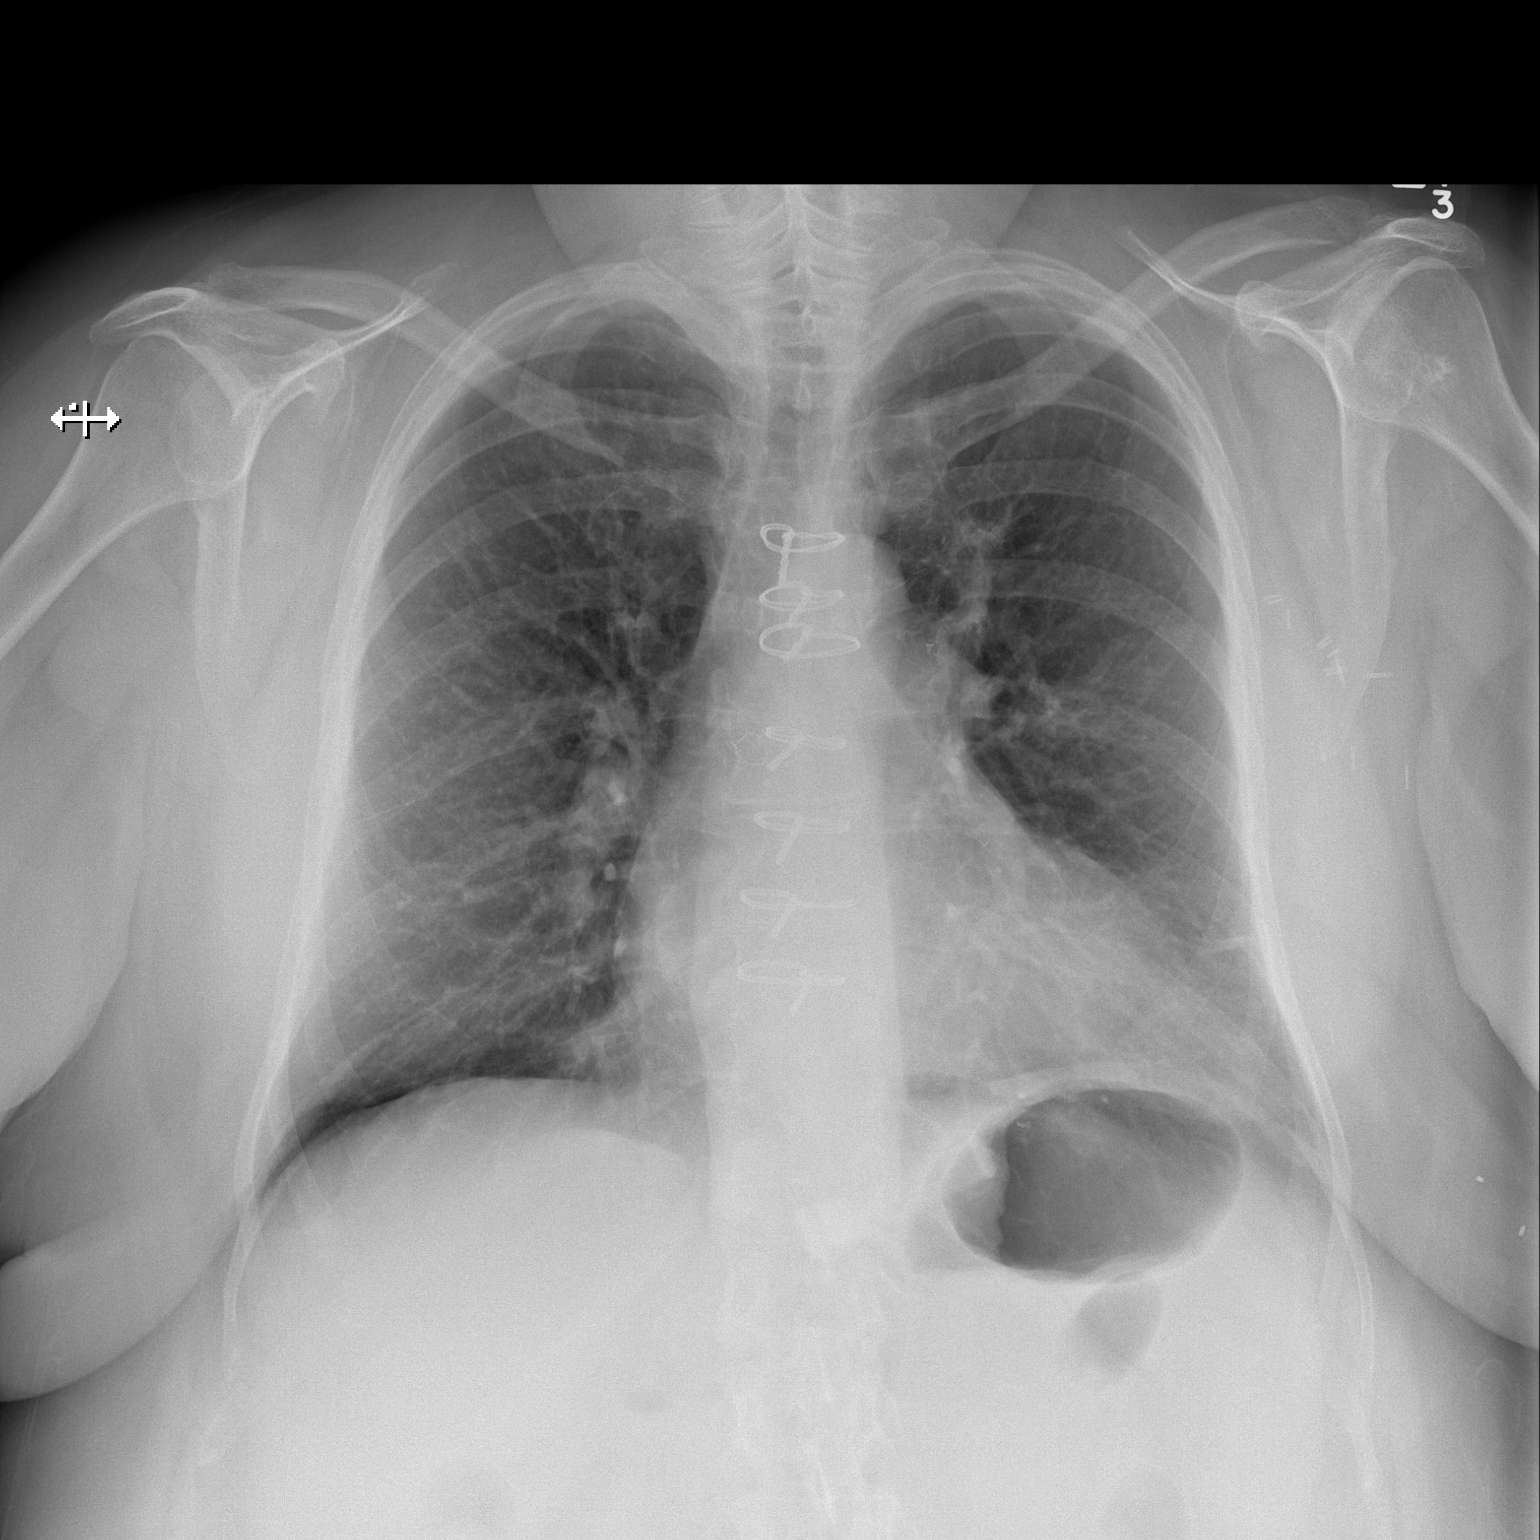

[w chest lat]
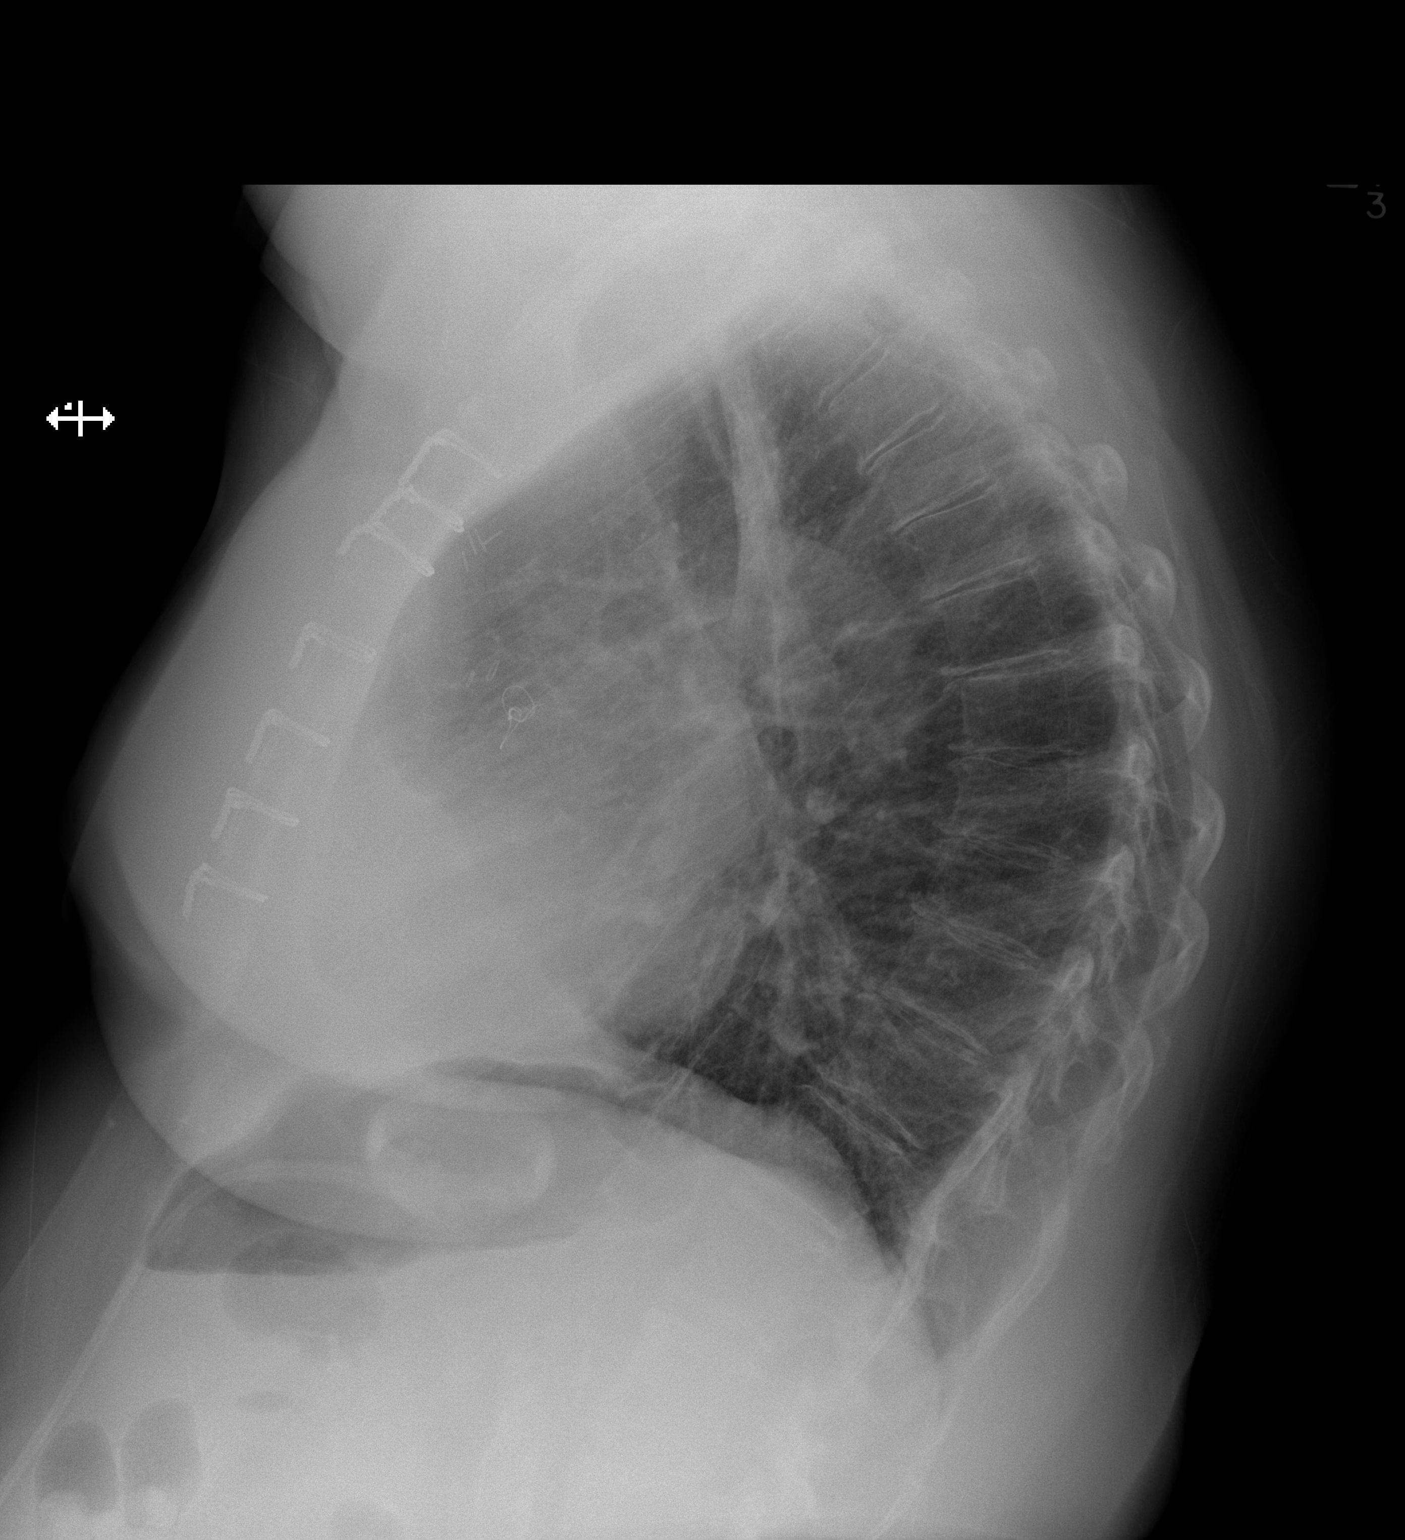

[2 of 2 positions shown; findings below may reference images not displayed]

FINDINGS: Left suprahilar are nodule is again identified, better seen on prior
CT. Surgical clips are present in the left axilla. The right lung
appears clear. CABG/ median sternotomy. No airspace disease or
pleural effusion. Subsegmental atelectasis along the left heart
border.

Laparoscopic gastric band is identified in the upper abdomen.
IMPRESSION: No acute cardiopulmonary disease.  Left suprahilar nodule.

## 2014-09-23 NOTE — Telephone Encounter (Signed)
none

## 2014-10-05 IMAGING — CR DG CHEST 1V PORT
1 series · 1 of 1 positions shown · non-contrast
Comparison: 11/23/2013

CLINICAL DATA: Postop from bronchoscopy in lung biopsy.

EXAM:
PORTABLE CHEST - 1 VIEW

[AP]
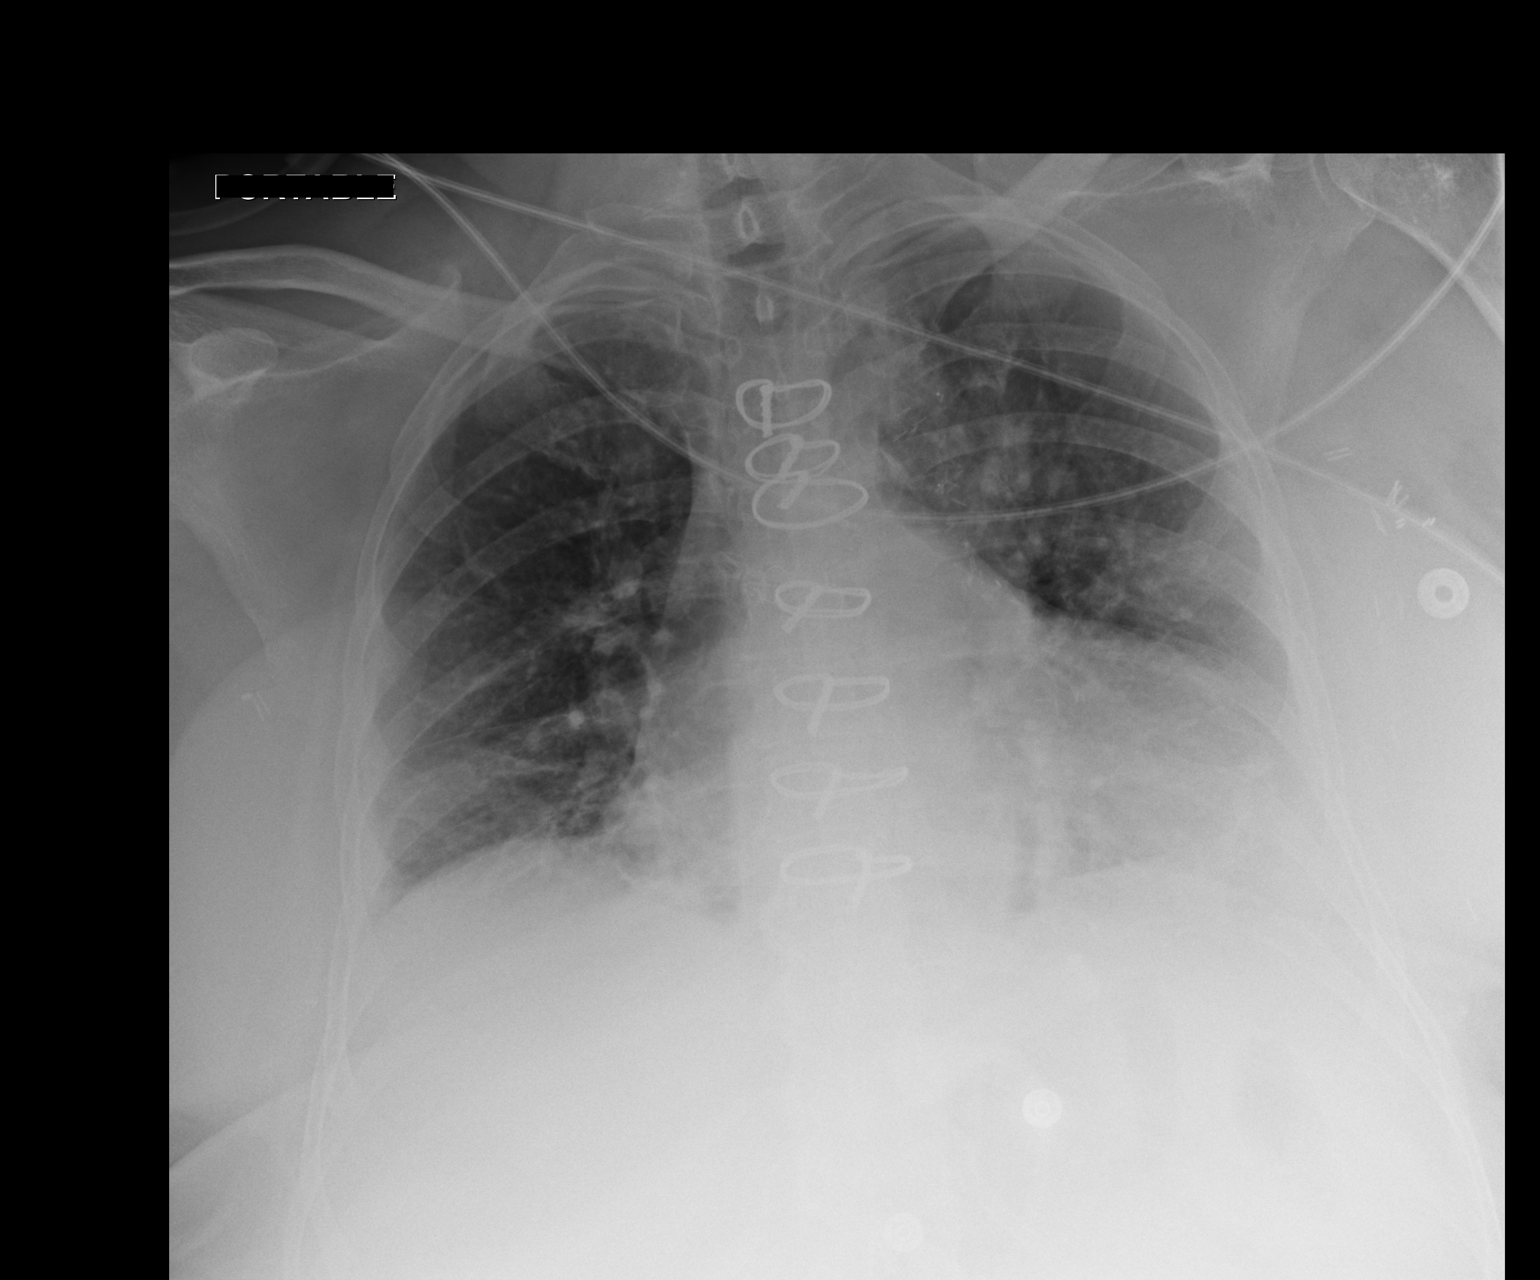

[1 of 1 positions shown; findings below may reference images not displayed]

FINDINGS: Decreased lung volumes are seen with mild bibasilar atelectasis. No
evidence of pneumothorax or hemothorax. Heart size is stable
allowing for decreased lung volumes. Asymmetric nodular density in
the central left upper lobe again noted. Prior median sternotomy and
gastric lap band again noted.
IMPRESSION: Low lung volumes with bibasilar atelectasis. No evidence of postop
pneumothorax.

## 2014-10-10 ENCOUNTER — Other Ambulatory Visit: Payer: Self-pay | Admitting: Cardiology

## 2014-10-25 ENCOUNTER — Other Ambulatory Visit: Payer: Self-pay | Admitting: Cardiology

## 2014-10-26 ENCOUNTER — Other Ambulatory Visit: Payer: Self-pay | Admitting: *Deleted

## 2014-10-26 DIAGNOSIS — C50912 Malignant neoplasm of unspecified site of left female breast: Secondary | ICD-10-CM

## 2014-10-26 MED ORDER — ANASTROZOLE 1 MG PO TABS: 1.0000 mg | ORAL_TABLET | Freq: Every day | ORAL | Status: DC

## 2014-10-28 ENCOUNTER — Emergency Department (HOSPITAL_COMMUNITY)
Admission: EM | Admit: 2014-10-28 | Discharge: 2014-10-29 | Disposition: A | Discharge status: Home / Self Care | Payer: Medicare Other | Attending: Emergency Medicine | Admitting: Emergency Medicine

## 2014-10-28 ENCOUNTER — Encounter (HOSPITAL_COMMUNITY): Payer: Self-pay | Admitting: *Deleted

## 2014-10-28 DIAGNOSIS — Z7982 Long term (current) use of aspirin: Secondary | ICD-10-CM | POA: Insufficient documentation

## 2014-10-28 DIAGNOSIS — Z87440 Personal history of urinary (tract) infections: Secondary | ICD-10-CM | POA: Insufficient documentation

## 2014-10-28 DIAGNOSIS — G43909 Migraine, unspecified, not intractable, without status migrainosus: Secondary | ICD-10-CM | POA: Diagnosis not present

## 2014-10-28 DIAGNOSIS — E669 Obesity, unspecified: Secondary | ICD-10-CM | POA: Diagnosis not present

## 2014-10-28 DIAGNOSIS — E119 Type 2 diabetes mellitus without complications: Secondary | ICD-10-CM | POA: Diagnosis not present

## 2014-10-28 DIAGNOSIS — Z951 Presence of aortocoronary bypass graft: Secondary | ICD-10-CM | POA: Diagnosis not present

## 2014-10-28 DIAGNOSIS — I2510 Atherosclerotic heart disease of native coronary artery without angina pectoris: Secondary | ICD-10-CM | POA: Diagnosis not present

## 2014-10-28 DIAGNOSIS — E785 Hyperlipidemia, unspecified: Secondary | ICD-10-CM | POA: Diagnosis not present

## 2014-10-28 DIAGNOSIS — R112 Nausea with vomiting, unspecified: Secondary | ICD-10-CM | POA: Diagnosis not present

## 2014-10-28 DIAGNOSIS — R109 Unspecified abdominal pain: Secondary | ICD-10-CM | POA: Diagnosis present

## 2014-10-28 DIAGNOSIS — Z853 Personal history of malignant neoplasm of breast: Secondary | ICD-10-CM | POA: Insufficient documentation

## 2014-10-28 DIAGNOSIS — I10 Essential (primary) hypertension: Secondary | ICD-10-CM | POA: Insufficient documentation

## 2014-10-28 DIAGNOSIS — Z9889 Other specified postprocedural states: Secondary | ICD-10-CM | POA: Insufficient documentation

## 2014-10-28 DIAGNOSIS — Z7951 Long term (current) use of inhaled steroids: Secondary | ICD-10-CM | POA: Insufficient documentation

## 2014-10-28 DIAGNOSIS — F419 Anxiety disorder, unspecified: Secondary | ICD-10-CM | POA: Insufficient documentation

## 2014-10-28 DIAGNOSIS — Z87891 Personal history of nicotine dependence: Secondary | ICD-10-CM | POA: Insufficient documentation

## 2014-10-28 DIAGNOSIS — I252 Old myocardial infarction: Secondary | ICD-10-CM | POA: Diagnosis not present

## 2014-10-28 DIAGNOSIS — Z79899 Other long term (current) drug therapy: Secondary | ICD-10-CM | POA: Insufficient documentation

## 2014-10-28 DIAGNOSIS — J449 Chronic obstructive pulmonary disease, unspecified: Secondary | ICD-10-CM | POA: Diagnosis not present

## 2014-10-28 DIAGNOSIS — R197 Diarrhea, unspecified: Secondary | ICD-10-CM | POA: Diagnosis not present

## 2014-10-28 MED ORDER — SODIUM CHLORIDE 0.9 % IV BOLUS (SEPSIS)
1000.0000 mL | Freq: Once | INTRAVENOUS | Status: AC
  Administered 2014-10-29: 1000 mL via INTRAVENOUS

## 2014-10-28 NOTE — ED Notes (Signed)
Patient called EMS due to c/o abdominal pain after eating seafood Patient also with c/o nausea Patient had similar episode several years ago Patient alert and oriented x 4 upon arrival  Patient in NAD

## 2014-10-28 NOTE — ED Notes (Signed)
Bed: RESA Expected date:  Expected time:  Means of arrival:  Comments: EMS 69 yo female abdominal pain

## 2014-10-29 DIAGNOSIS — R112 Nausea with vomiting, unspecified: Secondary | ICD-10-CM | POA: Diagnosis not present

## 2014-10-29 LAB — COMPREHENSIVE METABOLIC PANEL
ALBUMIN: 3.9 g/dL (ref 3.5–5.2)
ALT: 17 U/L (ref 0–35)
AST: 24 U/L (ref 0–37)
Alkaline Phosphatase: 95 U/L (ref 39–117)
Anion gap: 10 (ref 5–15)
BUN: 17 mg/dL (ref 6–23)
CALCIUM: 9.6 mg/dL (ref 8.4–10.5)
CO2: 25 mmol/L (ref 19–32)
CREATININE: 0.97 mg/dL (ref 0.50–1.10)
Chloride: 107 mmol/L (ref 96–112)
GFR calc non Af Amer: 58 mL/min — ABNORMAL LOW (ref >90)
GFR, EST AFRICAN AMERICAN: 67 mL/min — AB (ref >90)
GLUCOSE: 123 mg/dL — AB (ref 70–99)
POTASSIUM: 4.2 mmol/L (ref 3.5–5.1)
Sodium: 142 mmol/L (ref 135–145)
Total Bilirubin: 0.4 mg/dL (ref 0.3–1.2)
Total Protein: 7.5 g/dL (ref 6.0–8.3)

## 2014-10-29 LAB — LIPASE, BLOOD: LIPASE: 19 U/L (ref 11–59)

## 2014-10-29 LAB — CBC WITH DIFFERENTIAL/PLATELET
BASOS ABS: 0 10*3/uL (ref 0.0–0.1)
Basophils Relative: 0 % (ref 0–1)
Eosinophils Absolute: 0.2 10*3/uL (ref 0.0–0.7)
Eosinophils Relative: 2 % (ref 0–5)
HCT: 43.1 % (ref 36.0–46.0)
Hemoglobin: 13.6 g/dL (ref 12.0–15.0)
LYMPHS ABS: 2.5 10*3/uL (ref 0.7–4.0)
Lymphocytes Relative: 19 % (ref 12–46)
MCH: 28.4 pg (ref 26.0–34.0)
MCHC: 31.6 g/dL (ref 30.0–36.0)
MCV: 90 fL (ref 78.0–100.0)
MONO ABS: 1 10*3/uL (ref 0.1–1.0)
Monocytes Relative: 8 % (ref 3–12)
NEUTROS ABS: 9.2 10*3/uL — AB (ref 1.7–7.7)
NEUTROS PCT: 71 % (ref 43–77)
PLATELETS: 203 10*3/uL (ref 150–400)
RBC: 4.79 MIL/uL (ref 3.87–5.11)
RDW: 14.1 % (ref 11.5–15.5)
WBC: 12.9 10*3/uL — ABNORMAL HIGH (ref 4.0–10.5)

## 2014-10-29 NOTE — ED Notes (Signed)
Labs delayed due to patient in bathroom

## 2014-10-29 NOTE — Discharge Instructions (Signed)
Viral Gastroenteritis Ms. Laguardia, continue to stay well hydrated as this food poisoning runs its course. See your primary care physician within 3 days for follow-up. If symptoms worsen come back to emergency department immediately. Thank you. Viral gastroenteritis is also called stomach flu. This illness is caused by a certain type of germ (virus). It can cause sudden watery poop (diarrhea) and throwing up (vomiting). This can cause you to lose body fluids (dehydration). This illness usually lasts for 3 to 8 days. It usually goes away on its own. HOME CARE   Drink enough fluids to keep your pee (urine) clear or pale yellow. Drink small amounts of fluids often.  Ask your doctor how to replace body fluid losses (rehydration).  Avoid:  Foods high in sugar.  Alcohol.  Bubbly (carbonated) drinks.  Tobacco.  Juice.  Caffeine drinks.  Very hot or cold fluids.  Fatty, greasy foods.  Eating too much at one time.  Dairy products until 24 to 48 hours after your watery poop stops.  You may eat foods with active cultures (probiotics). They can be found in some yogurts and supplements.  Wash your hands well to avoid spreading the illness.  Only take medicines as told by your doctor. Do not give aspirin to children. Do not take medicines for watery poop (antidiarrheals).  Ask your doctor if you should keep taking your regular medicines.  Keep all doctor visits as told. GET HELP RIGHT AWAY IF:   You cannot keep fluids down.  You do not pee at least once every 6 to 8 hours.  You are short of breath.  You see blood in your poop or throw up. This may look like coffee grounds.  You have belly (abdominal) pain that gets worse or is just in one small spot (localized).  You keep throwing up or having watery poop.  You have a fever.  The patient is a child younger than 3 months, and he or she has a fever.  The patient is a child older than 3 months, and he or she has a fever and  problems that do not go away.  The patient is a child older than 3 months, and he or she has a fever and problems that suddenly get worse.  The patient is a baby, and he or she has no tears when crying. MAKE SURE YOU:   Understand these instructions.  Will watch your condition.  Will get help right away if you are not doing well or get worse. Document Released: 01/08/2008 Document Revised: 10/14/2011 Document Reviewed: 05/08/2011 [facility] Patient Information 2015 [place], [place]. This information is not intended to replace advice given to you by your health care provider. Make sure you discuss any questions you have with your health care provider.

## 2014-10-29 NOTE — ED Provider Notes (Signed)
CSN: [id number]     Arrival date & time 10/28/14  2342 History   First MD Initiated Contact with Patient 10/28/14 2347     Chief Complaint  Patient presents with  . Abdominal Pain     (Consider location/radiation/quality/duration/timing/severity/associated sxs/prior Treatment) HPI  Jamie [name] is a 69 y.o. female with past medical history of COPD, coronary artery disease, breast cancer, presenting today with abdominal pain. Patient's believes that she had food poisoning. She had shellfish earlier today. She then woke up in the middle the night with cramping abdominal pain diffusely. She had multiple episodes of nausea and vomiting. She also had multiple episodes of diarrhea. She is diaphoretic at the time. Currently she states her symptoms have all improved and she denies any nausea, vomiting or sensation of having a bowel movement. She denies any recent illness. This has happened to her in the past with food poisoning. She has no chest pain or shortness of breath. Patient does have a history of multiple abdominal surgeries including appendectomy, hysterectomy, laparoscopic gastric banding. Patient currently has no further complaints.  10 Systems reviewed and are negative for acute change except as noted in the HPI.     Past Medical History  Diagnosis Date  . Asthma   . COPD (chronic obstructive pulmonary disease)   . UTI (lower urinary tract infection)     freq-bladder implant -removed  . Hx of migraines   . CAD (coronary artery disease)     NSTEMI 5/13:  LHC demonstrated oLM 75%, pLAD 80%, RCA occluded, filled by collaterals from the LAD.  Echo 12/10/11: EF 60%.; CABG 12/13/11: LIMA-LAD, SVG-RCA, SVG-diagonal and OM.   Marland Kitchen HLD (hyperlipidemia)   . Carotid stenosis     Pre-CABG Dopplers 5/13: Bilateral 40-59%  . Myocardial infarction   . Obesity   . Sleep apnea     sleep study2-3 yrs ago lost weight afterward but gained back  . Shortness of breath   . Anxiety   . Hypertension   .  Breast cancer 08/2011    lt. breast ca  . DM2 (diabetes mellitus, type 2)     metformin   Past Surgical History  Procedure Laterality Date  . Cesarean section  04/1976  . Abdominal hysterectomy  1977  . Laparoscopic gastric banding  2010  . Nasal sinus surgery    . Tonsillectomy    . Appendectomy    . Mastectomy w/ sentinel node biopsy  10/18/2011    Procedure: MASTECTOMY WITH SENTINEL LYMPH NODE BIOPSY;  Surgeon: Merrie Roof, MD;  Location: [place];  Service: General;  Laterality: Bilateral;  bilateral mastectomy and left sentinel node biopsy  . Axillary lymph node dissection  10/18/2011    Procedure: AXILLARY LYMPH NODE DISSECTION;  Surgeon: Merrie Roof, MD;  Location: [place];  Service: General;  Laterality: Left;  . Breast reconstruction  10/18/2011    Procedure: BREAST RECONSTRUCTION;  Surgeon: Theodoro Kos, DO;  Location: [place];  Service: Plastics;  Laterality: Bilateral;   bilateral breast reconstruction with bilateral tissue expander and placement of flex hd.  . US echocardiography      at [place]  . Portacath placement  12/02/2011    Procedure: INSERTION PORT-A-CATH;  Surgeon: Merrie Roof, MD;  Location: [place];  Service: General;  Laterality: Right;  . Coronary artery bypass graft  12/13/2011    Procedure: CORONARY ARTERY BYPASS GRAFTING (CABG);  Surgeon: Gaye Pollack, MD;  Location: [place];  Service:  Open Heart Surgery;  Laterality: N/A;  Times four using endoscopically harvested left greater saphenous vein and left internal mammary artery. Right greater saphenous vein attempted; not appropriate for vein harvest.  . Lesion removal  04/09/2012    Procedure: LESION REMOVAL;  Surgeon: Theodoro Kos, DO;  Location: [place];  Service: Plastics;  Laterality: Bilateral;  . Port-a-cath removal  04/09/2012    Procedure: REMOVAL PORT-A-CATH;  Surgeon: Merrie Roof, MD;  Location: [place];  Service: General;  Laterality: Right;  . Video  bronchoscopy with endobronchial navigation Left 11/26/2013    Procedure: VIDEO BRONCHOSCOPY WITH ENDOBRONCHIAL NAVIGATION;  Surgeon: Collene Gobble, MD;  Location: [place];  Service: Thoracic;  Laterality: Left;  . Left heart catheterization with coronary angiogram N/A 12/11/2011    Procedure: LEFT HEART CATHETERIZATION WITH CORONARY ANGIOGRAM;  Surgeon: Larey Dresser, MD;  Location: [facility] CATH LAB;  Service: Cardiovascular;  Laterality: N/A;   Family History  Problem Relation Age of Onset  . Anesthesia problems Neg Hx   . Hypotension Neg Hx   . Malignant hyperthermia Neg Hx   . Pseudochol deficiency Neg Hx   . Heart disease Mother   . Breast cancer Maternal Aunt   . Ovarian cancer Paternal Aunt   . Breast cancer Maternal Aunt   . Heart disease Father    History  Substance Use Topics  . Smoking status: Former Smoker -- 2.00 packs/day for 40 years    Types: Cigarettes    Quit date: 08/25/2006  . Smokeless tobacco: Never Used  . Alcohol Use: Yes     Comment: 1-2 times a year   OB History    No data available     Review of Systems    Allergies  Vicodin and Sulfa antibiotics  Home Medications   Prior to Admission medications   Medication Sig Start Date End Date Taking? Authorizing Provider  anastrozole (ARIMIDEX) 1 MG tablet Take 1 tablet (1 mg total) by mouth daily. 10/26/14   Chauncey Cruel, MD  aspirin 325 MG EC tablet Take 325 mg by mouth daily.    Historical Provider, MD  atorvastatin (LIPITOR) 80 MG tablet Take 1 tablet (80 mg total) by mouth daily at 6 PM. 01/20/12   Scott T Kathlen Mody, PA-C  buPROPion (WELLBUTRIN XL) 150 MG 24 hr tablet Take 300 mg by mouth daily.  08/05/13   Historical Provider, MD  fluticasone (FLONASE) 50 MCG/ACT nasal spray Place 2 sprays into both nostrils daily.  08/14/13   Historical Provider, MD  lisinopril (PRINIVIL,ZESTRIL) 10 MG tablet TAKE ONE TABLET BY MOUTH ONCE DAILY (PATIENT  NEEDS  APPOINTMENT) 10/27/14   Larey Dresser, MD  loratadine  (CLARITIN) 10 MG tablet Take 10 mg by mouth daily.    Historical Provider, MD  metFORMIN (GLUCOPHAGE) 500 MG tablet Take 1 tablet by mouth 2 (two) times daily. 01/17/13   Historical Provider, MD  metoprolol (LOPRESSOR) 50 MG tablet TAKE ONE TABLET BY MOUTH TWICE DAILY **PATIENT  NEEDS  APPOINTMENT** 08/29/14   Larey Dresser, MD  nitrofurantoin, macrocrystal-monohydrate, (MACROBID) 100 MG capsule Take 1 capsule by mouth daily. 01/18/13   Historical Provider, MD  Nutritional Supplements (JUICE PLUS FIBRE PO) Take 1 tablet by mouth 2 (two) times daily.    Historical Provider, MD  trospium (SANCTURA) 20 MG tablet Take 20 mg by mouth at bedtime.  07/21/13   Historical Provider, MD   BP 154/63 mmHg  Pulse 78  Temp(Src) 97.8 F (  36.6 C) (Oral)  Resp 20  SpO2 98% Physical Exam  Constitutional: She is oriented to person, place, and time. She appears well-developed and well-nourished. No distress.  HENT:  Head: Normocephalic and atraumatic.  Nose: Nose normal.  Mouth/Throat: No oropharyngeal exudate.  Dry oropharynx  Eyes: Conjunctivae and EOM are normal. Pupils are equal, round, and reactive to light. No scleral icterus.  Neck: Normal range of motion. Neck supple. No JVD present. No tracheal deviation present. No thyromegaly present.  Cardiovascular: Normal rate and regular rhythm.  Exam reveals no gallop and no friction rub.   Murmur heard. Pulmonary/Chest: Effort normal and breath sounds normal. No respiratory distress. She has no wheezes. She exhibits no tenderness.  Abdominal: Soft. Bowel sounds are normal. She exhibits no distension and no mass. There is no tenderness. There is no rebound and no guarding.  Musculoskeletal: Normal range of motion. She exhibits no edema or tenderness.  Lymphadenopathy:    She has no cervical adenopathy.  Neurological: She is alert and oriented to person, place, and time. No cranial nerve deficit. She exhibits normal muscle tone.  Skin: Skin is warm and dry. No  rash noted. She is not diaphoretic. No erythema. No pallor.  Nursing note and vitals reviewed.   ED Course  Procedures (including critical care time) Labs Review Labs Reviewed  CBC WITH DIFFERENTIAL/PLATELET - Abnormal; Notable for the following:    WBC 12.9 (*)    Neutro Abs 9.2 (*)    All other components within normal limits  COMPREHENSIVE METABOLIC PANEL - Abnormal; Notable for the following:    Glucose, Bld 123 (*)    GFR calc non Af Amer 58 (*)    GFR calc Af Amer 67 (*)    All other components within normal limits  LIPASE, BLOOD    Imaging Review No results found.   EKG Interpretation None      MDM   Final diagnoses:  None    Patient presents emergency department for nausea vomiting diarrhea and crampy abdominal pain in the setting of eating shellfish. Currently she states all her symptoms have resolved. She has no focal tenderness on abdominal exam. I do not believe imaging is warranted currently due to the patient's history. We'll obtain laboratory studies and given a liter of IV fluids.   Patient continues to be stable in the emergency department. Laboratory studies did not show any significant cause for her abdominal pain nausea and vomiting and diarrhea. This is likely due to food poisoning. Patient is instructed to maintain her hydration of the next couple days. Primary care follow-up was advised within 3 days. Her vital signs were within her normal limits and she is safe for discharge.    Everlene Balls, MD 10/29/14 [phone]

## 2014-10-29 NOTE — ED Notes (Signed)
Patient has returned from bathroom No active vomiting Patient remains in NAD Phlebotomy at bedside

## 2014-11-10 ENCOUNTER — Other Ambulatory Visit: Payer: Self-pay | Admitting: Cardiology

## 2014-11-10 ENCOUNTER — Telehealth: Payer: Self-pay

## 2014-11-10 NOTE — Telephone Encounter (Signed)
Called and explained to pt I can not send in refill for lisinopril until she makes a follow up appointment. Transferred her to scheduling so she could make an appointment and told her to tell scheduling to transfer her to the refill dept.

## 2014-11-10 NOTE — Telephone Encounter (Signed)
Larey Dresser, MD at 03/26/2013 5:54 PM  lisinopril (PRINIVIL,ZESTRIL) 10 MG tabletTake 1 tablet (10 mg total) by mouth daily  Patient Instructions     Your physician wants you to follow-up in: 6 months with Dr Aundra Dubin

## 2014-11-30 ENCOUNTER — Ambulatory Visit: Payer: Medicare Other | Admitting: Physician Assistant

## 2014-12-01 ENCOUNTER — Ambulatory Visit (INDEPENDENT_AMBULATORY_CARE_PROVIDER_SITE_OTHER): Payer: Medicare Other | Admitting: Physician Assistant

## 2014-12-01 ENCOUNTER — Encounter: Payer: Self-pay | Admitting: Physician Assistant

## 2014-12-01 VITALS — BP 128/80 | HR 62 | Ht 64.0 in | Wt 248.8 lb

## 2014-12-01 DIAGNOSIS — I6523 Occlusion and stenosis of bilateral carotid arteries: Secondary | ICD-10-CM | POA: Diagnosis not present

## 2014-12-01 DIAGNOSIS — I10 Essential (primary) hypertension: Secondary | ICD-10-CM

## 2014-12-01 DIAGNOSIS — R011 Cardiac murmur, unspecified: Secondary | ICD-10-CM

## 2014-12-01 DIAGNOSIS — I2510 Atherosclerotic heart disease of native coronary artery without angina pectoris: Secondary | ICD-10-CM

## 2014-12-01 DIAGNOSIS — R079 Chest pain, unspecified: Secondary | ICD-10-CM | POA: Diagnosis not present

## 2014-12-01 DIAGNOSIS — I6529 Occlusion and stenosis of unspecified carotid artery: Secondary | ICD-10-CM | POA: Diagnosis not present

## 2014-12-01 DIAGNOSIS — E7800 Pure hypercholesterolemia, unspecified: Secondary | ICD-10-CM

## 2014-12-01 MED ORDER — NITROGLYCERIN 0.4 MG SL SUBL: 0.4000 mg | SUBLINGUAL_TABLET | SUBLINGUAL | Status: DC | PRN

## 2014-12-01 MED ORDER — LISINOPRIL 10 MG PO TABS: 10.0000 mg | ORAL_TABLET | Freq: Every day | ORAL | Status: DC

## 2014-12-01 MED ORDER — METOPROLOL TARTRATE 50 MG PO TABS: ORAL_TABLET | ORAL | Status: DC

## 2014-12-01 MED ORDER — ATORVASTATIN CALCIUM 80 MG PO TABS: 80.0000 mg | ORAL_TABLET | Freq: Every day | ORAL | Status: DC

## 2014-12-01 NOTE — Patient Instructions (Signed)
Medication Instructions:  Your physician recommends that you continue on your current medications as directed. Please refer to the Current Medication list given to you today.  Labwork: NONE  Testing/Procedures: Your physician has requested that you have an echocardiogram. Echocardiography is a painless test that uses sound waves to create images of your heart. It provides your doctor with information about the size and shape of your heart and how well your heart's chambers and valves are working. This procedure takes approximately one hour. There are no restrictions for this procedure.  Your physician has requested that you have a carotid duplex. This test is an ultrasound of the carotid arteries in your neck. It looks at blood flow through these arteries that supply the brain with blood. Allow one hour for this exam. There are no restrictions or special instructions.  Your physician has requested that you have a lexiscan myoview. For further information please visit [email or web address]. Please follow instruction sheet, as given.  Follow-Up: Your physician wants you to follow-up in: [place] DR. Aundra Dubin You will receive a reminder letter in the mail two months in advance. If you don't receive a letter, please call our office to schedule the follow-up appointment.   Any Other Special Instructions Will Be Listed Below (If Applicable).

## 2014-12-01 NOTE — Progress Notes (Signed)
Cardiology Office Note   Date:  12/01/2014   ID:  Jamie [name], DOB August 17, 1945, MRN [id number]  PCP:  Verdell Carmine., MD  Cardiologist:  Dr. Loralie Champagne     Chief Complaint  Patient presents with  . Coronary Artery Disease     History of Present Illness: Jamie [name] is a 69 y.o. female with a hx of CAD, breast CA status post bilateral mastectomies, diabetes, sleep apnea, COPD, obesity, HL, carotid stenosis..  Admitted with a non-STEMI and diastolic HF in 01/1442.  LHC demonstrated ostial left main disease. She was referred for bypass surgery with Dr. Cyndia Bent.  She had a LIMA-LAD, SVG-RCA, SVG-diagonal and OM.  Last seen by Dr. Aundra Dubin 03/2013.  She returns for FU.  Since last seen, she has been doing well overall. Over last several months, she has noted some right-sided chest tightness. This typically occurs at rest. She denies exertional symptoms. She does not do structured exercise. She denies orthopnea, PND or edema. She denies significant dyspnea on exertion. Overall, she is NYHA 2b.  Studies/Reports Reviewed Today:  Carotid US 1/54 RICA 00-86%, LICA 7-61% >> FU 1 year  Echo 02/04/13 - EF 60% to 65%. Wall motion was normal;   - Aortic valve: Valve area: 3.07cm^2(VTI). Valve area: 3.14cm^2 (Vmax).   Past Medical History  Diagnosis Date  . Asthma   . COPD (chronic obstructive pulmonary disease)   . UTI (lower urinary tract infection)     freq-bladder implant -removed  . Hx of migraines   . CAD (coronary artery disease)     NSTEMI 5/13:  LHC demonstrated oLM 75%, pLAD 80%, RCA occluded, filled by collaterals from the LAD.  Echo 12/10/11: EF 60%.; CABG 12/13/11: LIMA-LAD, SVG-RCA, SVG-diagonal and OM.   Marland Kitchen HLD (hyperlipidemia)   . Carotid stenosis     Pre-CABG Dopplers 5/13: Bilateral 40-59%  . Myocardial infarction   . Obesity   . Sleep apnea     sleep study2-3 yrs ago lost weight afterward but gained back  . Shortness of breath   . Anxiety   . Hypertension   .  Breast cancer 08/2011    lt. breast ca  . DM2 (diabetes mellitus, type 2)     metformin  1. Asthma 2. Diet-controlled diabetes 3. OSA: mild on 7/14 sleep study.  4. Frequent UTIs 5. Migraines 6. Breast cancer: s/p bilateral mastectomies, now on Letrozole.  7. CAD: NSTEMI in 5/13 with severe 3VD. CABG 5/13 with LIMA-LAD, SVG-RCA, SVG-D, and SVG-OM.  8. Hyperlipidemia 9. Carotid stenosis: 5/13 dopplers with 40-59% bilateral ICAs. 5/14 dopplers with 40-59% RICA.  10. Obesity: h/o lap banding.  11. Diastolic CHF: Echo (9/50) with EF 60%, technically difficult study. Echo (7/14): EF 60-65%.    Past Surgical History  Procedure Laterality Date  . Cesarean section  04/1976  . Abdominal hysterectomy  1977  . Laparoscopic gastric banding  2010  . Nasal sinus surgery    . Tonsillectomy    . Appendectomy    . Mastectomy w/ sentinel node biopsy  10/18/2011    Procedure: MASTECTOMY WITH SENTINEL LYMPH NODE BIOPSY;  Surgeon: Merrie Roof, MD;  Location: [place];  Service: General;  Laterality: Bilateral;  bilateral mastectomy and left sentinel node biopsy  . Axillary lymph node dissection  10/18/2011    Procedure: AXILLARY LYMPH NODE DISSECTION;  Surgeon: Merrie Roof, MD;  Location: [place];  Service: General;  Laterality: Left;  . Breast reconstruction  10/18/2011  Procedure: BREAST RECONSTRUCTION;  Surgeon: Theodoro Kos, DO;  Location: [place];  Service: Plastics;  Laterality: Bilateral;   bilateral breast reconstruction with bilateral tissue expander and placement of flex hd.  . US echocardiography      at [place]  . Portacath placement  12/02/2011    Procedure: INSERTION PORT-A-CATH;  Surgeon: Merrie Roof, MD;  Location: [place];  Service: General;  Laterality: Right;  . Coronary artery bypass graft  12/13/2011    Procedure: CORONARY ARTERY BYPASS GRAFTING (CABG);  Surgeon: Gaye Pollack, MD;  Location: [place];  Service: Open Heart Surgery;  Laterality: N/A;  Times four using  endoscopically harvested left greater saphenous vein and left internal mammary artery. Right greater saphenous vein attempted; not appropriate for vein harvest.  . Lesion removal  04/09/2012    Procedure: LESION REMOVAL;  Surgeon: Theodoro Kos, DO;  Location: [place];  Service: Plastics;  Laterality: Bilateral;  . Port-a-cath removal  04/09/2012    Procedure: REMOVAL PORT-A-CATH;  Surgeon: Merrie Roof, MD;  Location: [place];  Service: General;  Laterality: Right;  . Video bronchoscopy with endobronchial navigation Left 11/26/2013    Procedure: VIDEO BRONCHOSCOPY WITH ENDOBRONCHIAL NAVIGATION;  Surgeon: Collene Gobble, MD;  Location: [place];  Service: Thoracic;  Laterality: Left;  . Left heart catheterization with coronary angiogram N/A 12/11/2011    Procedure: LEFT HEART CATHETERIZATION WITH CORONARY ANGIOGRAM;  Surgeon: Larey Dresser, MD;  Location: [facility] C[facility] LAB;  Service: Cardiovascular;  Laterality: N/A;     Current Outpatient Prescriptions  Medication Sig Dispense Refill  . anastrozole (ARIMIDEX) 1 MG tablet Take 1 tablet (1 mg total) by mouth daily. 90 tablet 3  . aspirin 325 MG EC tablet Take 325 mg by mouth daily.    Marland Kitchen atorvastatin (LIPITOR) 80 MG tablet Take 1 tablet (80 mg total) by mouth daily at 6 PM. 90 tablet 3  . buPROPion (WELLBUTRIN XL) 150 MG 24 hr tablet Take 300 mg by mouth daily.     . fluticasone (FLONASE) 50 MCG/ACT nasal spray Place 2 sprays into both nostrils daily.     Marland Kitchen lisinopril (PRINIVIL,ZESTRIL) 10 MG tablet Take 1 tablet (10 mg total) by mouth daily. 90 tablet 3  . loratadine (CLARITIN) 10 MG tablet Take 10 mg by mouth daily.    . metFORMIN (GLUCOPHAGE) 500 MG tablet Take 1 tablet by mouth 2 (two) times daily.    . metoprolol (LOPRESSOR) 50 MG tablet TAKE ONE TABLET BY MOUTH TWICE DAILY 180 tablet 3  . mirabegron ER (MYRBETRIQ) 50 MG TB24 tablet Take 50 mg by mouth.    . nitrofurantoin, macrocrystal-monohydrate, (MACROBID) 100  MG capsule Take 1 capsule by mouth daily.    . Nutritional Supplements (JUICE PLUS FIBRE PO) Take 1 tablet by mouth 2 (two) times daily.    . trospium (SANCTURA) 20 MG tablet Take 20 mg by mouth at bedtime.     . nitroGLYCERIN (NITROSTAT) 0.4 MG SL tablet Place 1 tablet (0.4 mg total) under the tongue every 5 (five) minutes as needed. 25 tablet 3   No current facility-administered medications for this visit.    Allergies:   Vicodin and Sulfa antibiotics    Social History:  The patient  reports that she quit smoking about 8 years ago. Her smoking use included Cigarettes. She has a 80 pack-year smoking history. She has never used smokeless tobacco. She reports that she drinks alcohol. She reports that she does  not use illicit drugs.   Family History:  The patient's family history includes Breast cancer in her maternal aunt and maternal aunt; Heart disease in her father and mother; Ovarian cancer in her paternal aunt. There is no history of Anesthesia problems, Hypotension, Malignant hyperthermia, or Pseudochol deficiency.   ROS:   Please see the history of present illness.   Review of Systems  HENT: Positive for hearing loss.   Cardiovascular: Positive for chest pain.  Psychiatric/Behavioral: Positive for depression.  All other systems reviewed and are negative.    PHYSICAL EXAM: VS:  BP 128/80 mmHg  Pulse 62  Ht 5\' 4"  (1.626 m)  Wt 248 lb 12.8 oz (112.855 kg)  BMI 42.69 kg/m2    Wt Readings from Last 3 Encounters:  12/01/14 248 lb 12.8 oz (112.855 kg)  08/19/14 245 lb (111.131 kg)  05/17/14 243 lb 14.4 oz (110.632 kg)     GEN: Well nourished, well developed, in no acute distress HEENT: normal Neck: no JVD,   no masses Cardiac:  Normal C1/Y6, RRR; 2/6 systolic murmur RUSB,  no rubs or gallops, no edema  Respiratory:  clear to auscultation bilaterally, no wheezing, rhonchi or rales. GI: soft, nontender, nondistended, + BS MS: no deformity or atrophy Skin: warm and dry    Neuro:  CNs II-XII intact, Strength and sensation are intact Psych: Normal affect   EKG:  EKG is ordered today.  It demonstrates:   NSR, HR 62, normal axis, no change from prior tracing   Recent Labs: 10/29/2014: ALT 17; BUN 17; Creatinine 0.97; Hemoglobin 13.6; Platelets 203; Potassium 4.2; Sodium 142    Lipid Panel    Component Value Date/Time   CHOL 126 03/10/2012 1015   TRIG 182.0* 03/10/2012 1015   HDL 41.10 03/10/2012 1015   CHOLHDL 3 03/10/2012 1015   VLDL 36.4 03/10/2012 1015   LDLCALC 49 03/10/2012 1015      ASSESSMENT AND PLAN:  Coronary artery disease Over the last several months, she has noticed some chest tightness. This is somewhat atypical. It has been 3 years since her bypass surgery and she does have diabetes. I will arrange a Lexiscan Myoview to rule out ischemia. Continue aspirin, statin, ACE inhibitor, beta blocker.   Hypertension Controlled.  Hyperlipidemia She has labs drawn with primary care. I will request her most recent that the panel. Continue statin.  Carotid stenosis She is due for carotid Dopplers in June 2016.  Murmur I suspect that this is related to aortic sclerosis. Her murmur sounds quite loud. I will arrange a follow-up echocardiogram.  Breast cancer Follow-up with oncology as planned.   Current medicines are reviewed at length with the patient today.  Concerns regarding medicines are as outlined above.  The following changes have been made:    None    Labs/ tests ordered today include:  Orders Placed This Encounter  Procedures  . Myocardial Perfusion Imaging  . EKG 12-Lead  . Echocardiogram    Disposition:   FU with Dr. Loralie Champagne 6 mos (or sooner if stress test is abnormal or chest pain progresses).   Signed, Versie Starks, MHS 12/01/2014 4:54 PM    [facility] [address], [place], [place]  [address] Phone: [phone]; Fax: [phone]

## 2014-12-12 ENCOUNTER — Telehealth (HOSPITAL_COMMUNITY): Payer: Self-pay

## 2014-12-12 NOTE — Telephone Encounter (Signed)
Patient given detailed instructions per Myocardial Perfusion Study Information Sheet for test on 12-13-2014 at 8:15am. Patient verbalized understanding. Jamie [name], Sylvanus Telford A

## 2014-12-13 ENCOUNTER — Ambulatory Visit (HOSPITAL_COMMUNITY): Payer: Medicare Other | Attending: Cardiovascular Disease

## 2014-12-13 ENCOUNTER — Other Ambulatory Visit: Payer: Self-pay

## 2014-12-13 ENCOUNTER — Ambulatory Visit (HOSPITAL_BASED_OUTPATIENT_CLINIC_OR_DEPARTMENT_OTHER): Payer: Medicare Other

## 2014-12-13 DIAGNOSIS — R079 Chest pain, unspecified: Secondary | ICD-10-CM

## 2014-12-13 DIAGNOSIS — I2510 Atherosclerotic heart disease of native coronary artery without angina pectoris: Secondary | ICD-10-CM

## 2014-12-13 DIAGNOSIS — I350 Nonrheumatic aortic (valve) stenosis: Secondary | ICD-10-CM | POA: Diagnosis not present

## 2014-12-13 DIAGNOSIS — R011 Cardiac murmur, unspecified: Secondary | ICD-10-CM | POA: Diagnosis not present

## 2014-12-13 MED ORDER — AMINOPHYLLINE 25 MG/ML IV SOLN
75.0000 mg | Freq: Once | INTRAVENOUS | Status: AC
  Administered 2014-12-13: 75 mg via INTRAVENOUS

## 2014-12-13 MED ORDER — REGADENOSON 0.4 MG/5ML IV SOLN
0.4000 mg | Freq: Once | INTRAVENOUS | Status: AC
  Administered 2014-12-13: 0.4 mg via INTRAVENOUS

## 2014-12-13 MED ORDER — TECHNETIUM TC 99M SESTAMIBI GENERIC - CARDIOLITE
33.0000 | Freq: Once | INTRAVENOUS | Status: AC | PRN
  Administered 2014-12-13: 33 via INTRAVENOUS

## 2014-12-13 MED ORDER — NITROGLYCERIN 0.4 MG SL SUBL
0.4000 mg | SUBLINGUAL_TABLET | SUBLINGUAL | Status: DC | PRN
Start: 2014-12-13 — End: 2017-08-13

## 2014-12-14 ENCOUNTER — Telehealth: Payer: Self-pay | Admitting: *Deleted

## 2014-12-14 ENCOUNTER — Ambulatory Visit (HOSPITAL_COMMUNITY): Payer: Medicare Other | Attending: Cardiology

## 2014-12-14 ENCOUNTER — Encounter: Payer: Self-pay | Admitting: Physician Assistant

## 2014-12-14 LAB — MYOCARDIAL PERFUSION IMAGING
CHL CUP STRESS STAGE 1 GRADE: 0 %
CHL CUP STRESS STAGE 1 HR: 76 {beats}/min
CHL CUP STRESS STAGE 2 GRADE: 0 %
CHL CUP STRESS STAGE 4 SPEED: 0 mph
CHL CUP STRESS STAGE 5 GRADE: 0 %
CHL CUP STRESS STAGE 5 HR: 97 {beats}/min
CHL CUP STRESS STAGE 5 SPEED: 0 mph
CHL CUP STRESS STAGE 6 DBP: 82 mmHg
CHL CUP STRESS STAGE 6 GRADE: 0 %
CHL CUP STRESS STAGE 6 HR: 88 {beats}/min
CHL CUP STRESS STAGE 6 SBP: 160 mmHg
Estimated workload: 1 METS
LV dias vol: 75 mL
LVSYSVOL: 22 mL
NUC STRESS EF: 70 %
NUC STRESS TID: 0.94
Peak HR: 99 {beats}/min
Percent of predicted max HR: 66 %
RATE: 0.33
Rest HR: 75 {beats}/min
SDS: 4
SRS: 5
SSS: 9
Stage 1 DBP: 67 mmHg
Stage 1 SBP: 162 mmHg
Stage 1 Speed: 0 mph
Stage 2 HR: 74 {beats}/min
Stage 2 Speed: 0 mph
Stage 3 DBP: 63 mmHg
Stage 3 Grade: 0 %
Stage 3 HR: 99 {beats}/min
Stage 3 SBP: 151 mmHg
Stage 3 Speed: 0 mph
Stage 4 Grade: 0 %
Stage 4 HR: 99 {beats}/min
Stage 5 DBP: 65 mmHg
Stage 5 SBP: 158 mmHg
Stage 6 Speed: 0 mph

## 2014-12-14 MED ORDER — TECHNETIUM TC 99M SESTAMIBI GENERIC - CARDIOLITE
33.0000 | Freq: Once | INTRAVENOUS | Status: AC | PRN
  Administered 2014-12-14: 33 via INTRAVENOUS

## 2014-12-14 NOTE — Telephone Encounter (Signed)
Pt notified of echo results by phone today with verbal understanding.

## 2014-12-15 ENCOUNTER — Encounter: Payer: Self-pay | Admitting: Physician Assistant

## 2014-12-15 ENCOUNTER — Telehealth: Payer: Self-pay | Admitting: *Deleted

## 2014-12-15 DIAGNOSIS — I2510 Atherosclerotic heart disease of native coronary artery without angina pectoris: Secondary | ICD-10-CM

## 2014-12-15 MED ORDER — ISOSORBIDE MONONITRATE ER 30 MG PO TB24: 30.0000 mg | ORAL_TABLET | Freq: Every day | ORAL | Status: DC

## 2014-12-15 NOTE — Telephone Encounter (Signed)
Pt started on Imdur 30 mg 12/15/14 per SW. PA . 4 week f/u on med change same day Dr. Aundra Dubin in the office discuss recent myoview further, possible cath. Pt agreeable to plan of care

## 2015-01-04 ENCOUNTER — Telehealth: Payer: Self-pay | Admitting: *Deleted

## 2015-01-04 ENCOUNTER — Ambulatory Visit (HOSPITAL_COMMUNITY): Payer: Medicare Other | Attending: Cardiovascular Disease

## 2015-01-04 DIAGNOSIS — I6529 Occlusion and stenosis of unspecified carotid artery: Secondary | ICD-10-CM | POA: Insufficient documentation

## 2015-01-04 MED ORDER — AMLODIPINE BESYLATE 2.5 MG PO TABS: 2.5000 mg | ORAL_TABLET | Freq: Every day | ORAL | Status: DC

## 2015-01-04 NOTE — Telephone Encounter (Signed)
Pt at our office for carotid doppler,pt asked to speak with nurse about headache.  Pt states that since starting isosorbide she has had a continuous headache for about 2 weeks, pt does feel it has helped her chest tightness. I reviewed with Richardson Dopp, PA-C --he recommended pt stop isosorbide and start amlodipine 2.5mg  daily. Pt advised, verbalized understanding.  Pt reminded to keep her appt with [facility] 01/26/15.

## 2015-01-06 ENCOUNTER — Encounter: Payer: Self-pay | Admitting: Physician Assistant

## 2015-01-10 ENCOUNTER — Telehealth: Payer: Self-pay | Admitting: *Deleted

## 2015-01-10 DIAGNOSIS — I6523 Occlusion and stenosis of bilateral carotid arteries: Secondary | ICD-10-CM

## 2015-01-10 NOTE — Telephone Encounter (Signed)
Called pt in error today. I had s/w pt yesterday and forgot to note my call for her results. Pt said that was ok. Pt notified 6/6 of carotid results and agreeable to repeat carotids in 6 months.

## 2015-01-25 NOTE — Progress Notes (Signed)
Cardiology Office Note   Date:  01/26/2015   ID:  Jamie [name], DOB 09-02-1945, MRN [id number]  PCP:  Verdell Carmine., MD  Cardiologist:  Dr. Loralie Champagne     Chief Complaint  Patient presents with  . Coronary Artery Disease     History of Present Illness: Jamie [name] is a 69 y.o. female with a hx of CAD, breast CA status post bilateral mastectomies, diabetes, sleep apnea, COPD, obesity, HL, carotid stenosis..  Admitted with a non-STEMI and diastolic HF in 12/4654.  LHC demonstrated ostial left main disease. She was referred for bypass surgery with Dr. Cyndia Bent.  She had a LIMA-LAD, SVG-RCA, SVG-diagonal and OM.    Here for FU in 11/2014.  She complained of R sided chest tightness.  Lexiscan Myoview was arranged and demonstrated inferolateral ischemia of a small degree. The study was felt to be low risk and EF > 65%.  I reviewed this with Dr. Loralie Champagne and we placed her on long acting Nitrates to advance medical therapy.  If she had worsening or refractory symptoms, she would need to undergo LHC.  FU Carotid US showed some progression of LICA stenosis to 81-27% and repeat recommended in 12.2016.  Echo demonstrated normal LV function with mild diastolic dysfunction and mild aortic stenosis with a mean gradient of 14 mmHg.  She returns for follow-up. She returns with her husband.  Since starting on Imdur, she had no further chest discomfort.  She had to stop this b/c of severe HAs.  She is now on Norvasc.  She did have an episode of chest discomfort while off the Imdur relieved by NTG.  She has not had any further symptoms since she started on Norvasc.  She denies significant change in her breathing.  She is NYHA 2b.  She denies PND, edema.  She sleeps in a recliner due to comfort.  She denies sycnope.    Studies/Reports Reviewed Today:  Carotid US 12/03/68 01-74% RICA  LICA  94-49% range. Normal subclavian arteries, bilaterally. f/u 6 months.  Myoview 12/14/14 Myocardial perfusion  is abnormal. Findings consistent with basal inferolateral ischemia. This is a low risk study. Overall left ventricular systolic function was normal. LV cavity size is normal. The left ventricular ejection fraction is hyperdynamic (>65%). There is no prior study for comparison.  Echo 12/13/14 - EF 60%to 65%. Wall motion was normal; Grade 1 diastolicdysfunction). - Aortic valve: Moderate thickening and calcification, consistentwith sclerosis. Valve mobility was restricted. There was mildstenosis. Valve area (VTI): 1.75 cm^2. Valve area (Vmax): 1.35cm^2. Valve area (Vmean): 1.68 cm^2.Mean gradient (S): 14 mm Hg. Peak gradient (S): 32 mm Hg. - Mitral valve: Calcified annulus.  Carotid US 6/75 RICA 91-63%, LICA 8-46% >> FU 1 year  Echo 02/04/13 - EF 60% to 65%. Wall motion was normal;   - Aortic valve: Valve area: 3.07cm^2(VTI). Valve area: 3.14cm^2 (Vmax).   Past Medical History  Diagnosis Date  . Asthma   . COPD (chronic obstructive pulmonary disease)   . UTI (lower urinary tract infection)     freq-bladder implant -removed  . Hx of migraines   . CAD (coronary artery disease)     NSTEMI 5/13:  LHC demonstrated oLM 75%, pLAD 80%, RCA occluded, filled by collaterals from the LAD.  Echo 12/10/11: EF 60%.; CABG 12/13/11: LIMA-LAD, SVG-RCA, SVG-diagonal and OM.   Marland Kitchen HLD (hyperlipidemia)   . Carotid stenosis     a.  Pre-CABG Dopplers 5/13: Bilateral 40-59%; b.  Carotid US 6/16: RICA  42-68%, LICA 34-19%, normal subclavians bilaterally >> FU 6 months  . Myocardial infarction   . Obesity   . Sleep apnea     sleep study2-3 yrs ago lost weight afterward but gained back  . Shortness of breath   . Anxiety   . Hypertension   . Breast cancer 08/2011    lt. breast ca  . DM2 (diabetes mellitus, type 2)     metformin  . Hx of echocardiogram     Echo 5/16:  EF 60-65%, no RWMA, Gr 1 DD, Ao sclerosis, mild AS, mean 14 mmHg, MAC  . Hx of cardiovascular stress test     Myoview 5/16:  small area of  inf-lat ischemia, EF 70%; Low Risk >> try medical Rx  1. Asthma 2. Diet-controlled diabetes 3. OSA: mild on 7/14 sleep study.  4. Frequent UTIs 5. Migraines 6. Breast cancer: s/p bilateral mastectomies, now on Letrozole.  7. CAD: NSTEMI in 5/13 with severe 3VD. CABG 5/13 with LIMA-LAD, SVG-RCA, SVG-D, and SVG-OM.  8. Hyperlipidemia 9. Carotid stenosis: 5/13 dopplers with 40-59% bilateral ICAs. 5/14 dopplers with 40-59% RICA.  10. Obesity: h/o lap banding.  11. Diastolic CHF: Echo (6/22) with EF 60%, technically difficult study. Echo (7/14): EF 60-65%.    Past Surgical History  Procedure Laterality Date  . Cesarean section  04/1976  . Abdominal hysterectomy  1977  . Laparoscopic gastric banding  2010  . Nasal sinus surgery    . Tonsillectomy    . Appendectomy    . Mastectomy w/ sentinel node biopsy  10/18/2011    Procedure: MASTECTOMY WITH SENTINEL LYMPH NODE BIOPSY;  Surgeon: Merrie Roof, MD;  Location: [facility];  Service: General;  Laterality: Bilateral;  bilateral mastectomy and left sentinel node biopsy  . Axillary lymph node dissection  10/18/2011    Procedure: AXILLARY LYMPH NODE DISSECTION;  Surgeon: Merrie Roof, MD;  Location: [facility];  Service: General;  Laterality: Left;  . Breast reconstruction  10/18/2011    Procedure: BREAST RECONSTRUCTION;  Surgeon: Theodoro Kos, DO;  Location: [facility];  Service: Plastics;  Laterality: Bilateral;   bilateral breast reconstruction with bilateral tissue expander and placement of flex hd.  . US echocardiography      at [facility]  . Portacath placement  12/02/2011    Procedure: INSERTION PORT-A-CATH;  Surgeon: Merrie Roof, MD;  Location: [facility];  Service: General;  Laterality: Right;  . Coronary artery bypass graft  12/13/2011    Procedure: CORONARY ARTERY BYPASS GRAFTING (CABG);  Surgeon: Gaye Pollack, MD;  Location: [facility];  Service: Open Heart Surgery;  Laterality: N/A;  Times four using endoscopically harvested left greater  saphenous vein and left internal mammary artery. Right greater saphenous vein attempted; not appropriate for vein harvest.  . Lesion removal  04/09/2012    Procedure: LESION REMOVAL;  Surgeon: Theodoro Kos, DO;  Location: [facility];  Service: Plastics;  Laterality: Bilateral;  . Port-a-cath removal  04/09/2012    Procedure: REMOVAL PORT-A-CATH;  Surgeon: Merrie Roof, MD;  Location: [facility];  Service: General;  Laterality: Right;  . Video bronchoscopy with endobronchial navigation Left 11/26/2013    Procedure: VIDEO BRONCHOSCOPY WITH ENDOBRONCHIAL NAVIGATION;  Surgeon: Collene Gobble, MD;  Location: [facility];  Service: Thoracic;  Laterality: Left;  . Left heart catheterization with coronary angiogram N/A 12/11/2011    Procedure: LEFT HEART CATHETERIZATION WITH CORONARY ANGIOGRAM;  Surgeon: Larey Dresser, MD;  Location: [facility];  Service: Cardiovascular;  Laterality: N/A;     Current Outpatient Prescriptions  Medication Sig Dispense Refill  . amLODipine (NORVASC) 5 MG tablet Take 1 tablet (5 mg total) by mouth daily. 30 tablet 6  . anastrozole (ARIMIDEX) 1 MG tablet Take 1 tablet (1 mg total) by mouth daily. 90 tablet 3  . aspirin 325 MG EC tablet Take 325 mg by mouth daily.    Marland Kitchen atorvastatin (LIPITOR) 80 MG tablet Take 1 tablet (80 mg total) by mouth daily at 6 PM. 90 tablet 3  . buPROPion (WELLBUTRIN XL) 300 MG 24 hr tablet Take 300 mg by mouth daily.     . fluticasone (FLONASE) 50 MCG/ACT nasal spray Place 2 sprays into both nostrils daily.     Marland Kitchen lisinopril (PRINIVIL,ZESTRIL) 10 MG tablet Take 1 tablet (10 mg total) by mouth daily. 90 tablet 3  . loratadine (CLARITIN) 10 MG tablet Take 10 mg by mouth daily.    . metFORMIN (GLUCOPHAGE) 500 MG tablet Take 1 tablet by mouth 2 (two) times daily.    . metoprolol (LOPRESSOR) 50 MG tablet TAKE ONE TABLET BY MOUTH TWICE DAILY 180 tablet 3  . mirabegron ER (MYRBETRIQ) 50 MG TB24 tablet Take 50 mg by mouth.    .  nitrofurantoin, macrocrystal-monohydrate, (MACROBID) 100 MG capsule Take 1 capsule by mouth daily.    . nitroGLYCERIN (NITROSTAT) 0.4 MG SL tablet Place 1 tablet (0.4 mg total) under the tongue every 5 (five) minutes as needed. 25 tablet 3  . Nutritional Supplements (JUICE PLUS FIBRE PO) Take 1 tablet by mouth 2 (two) times daily.    . trospium (SANCTURA) 20 MG tablet Take 20 mg by mouth at bedtime.      No current facility-administered medications for this visit.    Allergies:   Vicodin and Sulfa antibiotics    Social History:  The patient  reports that she quit smoking about 8 years ago. Her smoking use included Cigarettes. She has a 80 pack-year smoking history. She has never used smokeless tobacco. She reports that she drinks alcohol. She reports that she does not use illicit drugs.   Family History:  The patient's family history includes Breast cancer in her maternal aunt and maternal aunt; Heart disease in her father and mother; Ovarian cancer in her paternal aunt. There is no history of Anesthesia problems, Hypotension, Malignant hyperthermia, or Pseudochol deficiency.   ROS:   Please see the history of present illness.   Review of Systems  All other systems reviewed and are negative.    PHYSICAL EXAM: VS:  BP 140/70 mmHg  Pulse 60  Ht 5\' 4"  (1.626 m)  Wt 247 lb (112.038 kg)  BMI 42.38 kg/m2  SpO2 97%    Wt Readings from Last 3 Encounters:  01/26/15 247 lb (112.038 kg)  12/13/14 246 lb (111.585 kg)  12/01/14 248 lb 12.8 oz (112.855 kg)     GEN: Well nourished, well developed, in no acute distress HEENT: normal Neck: no JVD,   no masses Cardiac:  Normal M0/N0, RRR; 2/6 systolic murmur RUSB,  no rubs or gallops, no edema  Respiratory:  clear to auscultation bilaterally, no wheezing, rhonchi or rales. GI: soft, nontender, nondistended, + BS MS: no deformity or atrophy Skin: warm and dry  Neuro:  CNs II-XII intact, Strength and sensation are intact Psych: Normal  affect   EKG:  EKG is ordered today.  It demonstrates:   NSR, HR 60, normal axis, inf Q waves,  no change from prior tracing   Recent Labs: 10/29/2014: ALT 17; BUN 17; Creatinine, Ser 0.97; Hemoglobin 13.6; Platelets 203; Potassium 4.2; Sodium 142    Lipid Panel    Component Value Date/Time   CHOL 126 03/10/2012 1015   TRIG 182.0* 03/10/2012 1015   HDL 41.10 03/10/2012 1015   CHOLHDL 3 03/10/2012 1015   VLDL 36.4 03/10/2012 1015   LDLCALC 49 03/10/2012 1015      ASSESSMENT AND PLAN:  Coronary artery disease:  She has a recent hx of R sided chest discomfort.  This is unrelated to exertion.  Recent Myoview with low risk findings to suggest inferolateral ischemia and normal ejection fraction. Medical therapy has been recommended initially. She has been placed on isosorbide. However, she could not tolerate this secondary to headache. We therefore changed her to amlodipine.  She is symptom free on medical Rx. Continue aspirin, statin, ACE inhibitor, beta blocker, calcium channel blocker.  We discussed the importance of earlier FU if she has recurrent symptoms on medical Rx.  Hypertension:  BP elevated some today.  Will advance Amlodipine to 5 mg QD.   Hyperlipidemia:  Managed by primary care. LDL 08/30/14: 61.  Carotid stenosis:  Recent carotid ultrasound with progressive disease on the left. Repeat  carotid Dopplers in December 2016.  Aortic stenosis:  Mild by recent echocardiogram.  Consider FU echo in 1-2 years.   Breast cancer:  Follow-up with oncology as planned.    Medication Adjustments: Current medicines are reviewed at length with the patient today.  Concerns regarding medicines are as outlined above.  The following changes have been made:   Discontinued Medications   BUPROPION (WELLBUTRIN XL) 150 MG 24 HR TABLET    Take 300 mg by mouth daily.    Modified Medications   Modified Medication Previous Medication   AMLODIPINE (NORVASC) 5 MG TABLET amLODipine (NORVASC) 2.5 MG  tablet      Take 1 tablet (5 mg total) by mouth daily.    Take 1 tablet (2.5 mg total) by mouth daily.   New Prescriptions   No medications on file    Labs/ tests ordered today include:  Orders Placed This Encounter  Procedures  . EKG 12-Lead      Disposition:   FU Dr. Loralie Champagne 6 mos.    Signed, Versie Starks, MHS 01/26/2015 2:52 PM    [facility] [place], [place], [place]  [address] Phone: [phone]; Fax: [phone]

## 2015-01-26 ENCOUNTER — Encounter: Payer: Self-pay | Admitting: Physician Assistant

## 2015-01-26 ENCOUNTER — Ambulatory Visit (INDEPENDENT_AMBULATORY_CARE_PROVIDER_SITE_OTHER): Payer: Medicare Other | Admitting: Physician Assistant

## 2015-01-26 VITALS — BP 140/70 | HR 60 | Ht 64.0 in | Wt 247.0 lb

## 2015-01-26 DIAGNOSIS — I350 Nonrheumatic aortic (valve) stenosis: Secondary | ICD-10-CM | POA: Insufficient documentation

## 2015-01-26 DIAGNOSIS — I6523 Occlusion and stenosis of bilateral carotid arteries: Secondary | ICD-10-CM | POA: Diagnosis not present

## 2015-01-26 DIAGNOSIS — I10 Essential (primary) hypertension: Secondary | ICD-10-CM

## 2015-01-26 DIAGNOSIS — I2510 Atherosclerotic heart disease of native coronary artery without angina pectoris: Secondary | ICD-10-CM | POA: Diagnosis not present

## 2015-01-26 DIAGNOSIS — E7800 Pure hypercholesterolemia, unspecified: Secondary | ICD-10-CM

## 2015-01-26 MED ORDER — AMLODIPINE BESYLATE 5 MG PO TABS: 5.0000 mg | ORAL_TABLET | Freq: Every day | ORAL | Status: DC

## 2015-01-26 NOTE — Patient Instructions (Addendum)
Medication Instructions:   START TAKING AMLODIPINE 5 MG ONCE A DAY  .  Labwork:   Testing/Procedures:  CAROTID SCHEDULED IN DECEMBER 2016  Follow-Up:  Your physician wants you to follow-up in:  IN [place] will receive a reminder letter in the mail two months in advance. If you don't receive a letter, please call our office to schedule the follow-up appointment.    Any Other Special Instructions Will Be Listed Below (If Applicable).

## 2015-02-13 ENCOUNTER — Encounter (HOSPITAL_COMMUNITY): Payer: Self-pay

## 2015-02-13 ENCOUNTER — Ambulatory Visit (HOSPITAL_COMMUNITY)
Admission: RE | Admit: 2015-02-13 | Discharge: 2015-02-13 | Disposition: A | Discharge status: Home / Self Care | Payer: Medicare Other | Source: Ambulatory Visit | Attending: Oncology | Admitting: Oncology

## 2015-02-13 ENCOUNTER — Other Ambulatory Visit (HOSPITAL_BASED_OUTPATIENT_CLINIC_OR_DEPARTMENT_OTHER): Payer: Medicare Other

## 2015-02-13 ENCOUNTER — Other Ambulatory Visit: Payer: Self-pay | Admitting: Oncology

## 2015-02-13 DIAGNOSIS — K760 Fatty (change of) liver, not elsewhere classified: Secondary | ICD-10-CM

## 2015-02-13 DIAGNOSIS — C50912 Malignant neoplasm of unspecified site of left female breast: Secondary | ICD-10-CM

## 2015-02-13 DIAGNOSIS — Z9013 Acquired absence of bilateral breasts and nipples: Secondary | ICD-10-CM | POA: Diagnosis not present

## 2015-02-13 DIAGNOSIS — G4733 Obstructive sleep apnea (adult) (pediatric): Secondary | ICD-10-CM

## 2015-02-13 DIAGNOSIS — Z853 Personal history of malignant neoplasm of breast: Secondary | ICD-10-CM | POA: Insufficient documentation

## 2015-02-13 DIAGNOSIS — E785 Hyperlipidemia, unspecified: Secondary | ICD-10-CM

## 2015-02-13 DIAGNOSIS — E1165 Type 2 diabetes mellitus with hyperglycemia: Secondary | ICD-10-CM

## 2015-02-13 DIAGNOSIS — I6523 Occlusion and stenosis of bilateral carotid arteries: Secondary | ICD-10-CM

## 2015-02-13 DIAGNOSIS — J410 Simple chronic bronchitis: Secondary | ICD-10-CM

## 2015-02-13 DIAGNOSIS — I5022 Chronic systolic (congestive) heart failure: Secondary | ICD-10-CM

## 2015-02-13 DIAGNOSIS — I890 Lymphedema, not elsewhere classified: Secondary | ICD-10-CM

## 2015-02-13 DIAGNOSIS — I2510 Atherosclerotic heart disease of native coronary artery without angina pectoris: Secondary | ICD-10-CM

## 2015-02-13 DIAGNOSIS — E669 Obesity, unspecified: Secondary | ICD-10-CM

## 2015-02-13 LAB — COMPREHENSIVE METABOLIC PANEL (CC13)
ALT: 26 U/L (ref 0–55)
AST: 21 U/L (ref 5–34)
Albumin: 3.5 g/dL (ref 3.5–5.0)
Alkaline Phosphatase: 78 U/L (ref 40–150)
Anion Gap: 8 mEq/L (ref 3–11)
BILIRUBIN TOTAL: 0.39 mg/dL (ref 0.20–1.20)
BUN: 15 mg/dL (ref 7.0–26.0)
CO2: 26 meq/L (ref 22–29)
CREATININE: 0.8 mg/dL (ref 0.6–1.1)
Calcium: 9.4 mg/dL (ref 8.4–10.4)
Chloride: 108 mEq/L (ref 98–109)
EGFR: 73 mL/min/{1.73_m2} — AB (ref >90)
Glucose: 90 mg/dl (ref 70–140)
Potassium: 4.1 mEq/L (ref 3.5–5.1)
Sodium: 142 mEq/L (ref 136–145)
TOTAL PROTEIN: 6.9 g/dL (ref 6.4–8.3)

## 2015-02-13 LAB — CBC WITH DIFFERENTIAL/PLATELET
BASO%: 0.5 % (ref 0.0–2.0)
Basophils Absolute: 0 10*3/uL (ref 0.0–0.1)
EOS%: 3 % (ref 0.0–7.0)
Eosinophils Absolute: 0.2 10*3/uL (ref 0.0–0.5)
HCT: 40 % (ref 34.8–46.6)
HEMOGLOBIN: 12.7 g/dL (ref 11.6–15.9)
LYMPH%: 28.9 % (ref 14.0–49.7)
MCH: 28.2 pg (ref 25.1–34.0)
MCHC: 31.8 g/dL (ref 31.5–36.0)
MCV: 88.9 fL (ref 79.5–101.0)
MONO#: 0.5 10*3/uL (ref 0.1–0.9)
MONO%: 8.9 % (ref 0.0–14.0)
NEUT#: 3.3 10*3/uL (ref 1.5–6.5)
NEUT%: 58.7 % (ref 38.4–76.8)
PLATELETS: 159 10*3/uL (ref 145–400)
RBC: 4.5 10*6/uL (ref 3.70–5.45)
RDW: 14 % (ref 11.2–14.5)
WBC: 5.6 10*3/uL (ref 3.9–10.3)
lymph#: 1.6 10*3/uL (ref 0.9–3.3)

## 2015-02-13 MED ORDER — IOHEXOL 300 MG/ML  SOLN
100.0000 mL | Freq: Once | INTRAMUSCULAR | Status: AC | PRN
  Administered 2015-02-13: 80 mL via INTRAVENOUS

## 2015-02-20 ENCOUNTER — Ambulatory Visit (HOSPITAL_BASED_OUTPATIENT_CLINIC_OR_DEPARTMENT_OTHER): Payer: Medicare Other | Admitting: Nurse Practitioner

## 2015-02-20 ENCOUNTER — Encounter: Payer: Self-pay | Admitting: Nurse Practitioner

## 2015-02-20 ENCOUNTER — Telehealth: Payer: Self-pay | Admitting: Nurse Practitioner

## 2015-02-20 VITALS — BP 130/63 | HR 63 | Temp 98.2°F | Resp 19 | Ht 64.0 in | Wt 250.2 lb

## 2015-02-20 DIAGNOSIS — C50912 Malignant neoplasm of unspecified site of left female breast: Secondary | ICD-10-CM | POA: Diagnosis present

## 2015-02-20 DIAGNOSIS — R911 Solitary pulmonary nodule: Secondary | ICD-10-CM

## 2015-02-20 DIAGNOSIS — R918 Other nonspecific abnormal finding of lung field: Secondary | ICD-10-CM | POA: Diagnosis not present

## 2015-02-20 DIAGNOSIS — M8580 Other specified disorders of bone density and structure, unspecified site: Secondary | ICD-10-CM | POA: Insufficient documentation

## 2015-02-20 NOTE — Telephone Encounter (Signed)
Gave avs & calendar for July 2017

## 2015-02-20 NOTE — Progress Notes (Signed)
ID: MYLEKA MONCURE   DOB: 1946/04/05  MR#: [id number]  GYJ#:[id number]   PCP: Verdell Carmine., MD GYN: SURGAutumn Messing, MD;  Theodoro Kos, DO OTHER:  Loralie Champagne, MD;  Lance Morin, MD, Baltazar Apo MD  CHIEF COMPLAINT:  Estrogen receptor positive breast cancer  CURRENT TREATMENT: Anastrozole  HISTORY OF PRESENT ILLNESS: From the original intake note:  The patient noted a mass in her left breast and brought it to the attention of her primary physician, Dr. Ward Givens, who set her up for diagnostic mammography. This showed a mass of about 3 cm in diameter (I do not have a copy of that study or comparison with prior mammography from July of 2012). The patient was referred to Dr. Marlou Starks and he set her up for biopsy of the mass 08/28/2011 [facility]. The pathology showed [phone]) an invasive ductal carcinoma, grade 2, estrogen receptor 100% positive, progesterone receptor 96% positive, with an MIB-1 of 30%, and HER-2 equivocal with the ratio by CISH of 1.81.  Diagnostic right mammography was then performed 08/28/2011. Multiple masses were noted, and there were multiple soft tissue nodules by palpation. Ultrasound specifically showed 2 hypoechoic round masses measuring 1.3 and 0.9 cm respectively. 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 With this information and after extensive discussion the patient opted for bilateral mastectomies with left sentinel lymph node sampling, and this was performed 10/18/2011.  Her subsequent history is as detailed below  INTERVAL HISTORY: Charlet returns today for follow up of her left breast carcinoma. She has been on anastrozole since May 2013 and tolerates this drug remarkably well with no side effects that she is aware of. She denies hot flashes, vaginal changes, or arthralgias/myalgias.   REVIEW OF SYSTEMS: Kalleigh denies fevers, chills, nausea, vomiting, or change sin bowel or bladder  habits. She has shortness of breath with exertion and some very mild chest pain. She has been evaluated by a cardiologist who suggests she has a blockage in her heart, but this is not bad enough to require intervention. She is prescribed nitroglycerin, but has probably only used it once. She has a scooter to use to get around long distances as she can not walk very far and her balance is poor. She is headed to [place] this summer and will use it there. She denies headaches, dizziness, or vision changes. She does have hearing loss. A detailed review of systems is otherwise stable.  PAST MEDICAL HISTORY: Past Medical History  Diagnosis Date  . Asthma   . COPD (chronic obstructive pulmonary disease)   . UTI (lower urinary tract infection)     freq-bladder implant -removed  . Hx of migraines   . CAD (coronary artery disease)     NSTEMI 5/13:  LHC demonstrated oLM 75%, pLAD 80%, RCA occluded, filled by collaterals from the LAD.  Echo 12/10/11: EF 60%.; CABG 12/13/11: LIMA-LAD, SVG-RCA, SVG-diagonal and OM.   Marland Kitchen HLD (hyperlipidemia)   . Carotid stenosis     a.  Pre-CABG Dopplers 5/13: Bilateral 40-59%; b.  Carotid US 8/67: RICA 67-20%, LICA 94-70%, normal subclavians bilaterally >> FU 6 months  . Myocardial infarction   . Obesity   . Sleep apnea     sleep study2-3 yrs ago lost weight afterward but gained back  . Shortness of breath   . Anxiety   . Hypertension   . Breast cancer 08/2011    lt. breast ca  . DM2 (  diabetes mellitus, type 2)     metformin  . Hx of echocardiogram     Echo 5/16:  EF 60-65%, no RWMA, Gr 1 DD, Ao sclerosis, mild AS, mean 14 mmHg, MAC  . Hx of cardiovascular stress test     Myoview 5/16:  small area of inf-lat ischemia, EF 70%; Low Risk >> try medical Rx  hx tobacco abuse, 80 P/Y, resolved  PAST SURGICAL HISTORY: Past Surgical History  Procedure Laterality Date  . Cesarean section  04/1976  . Abdominal hysterectomy  1977  . Laparoscopic gastric banding  2010  .  Nasal sinus surgery    . Tonsillectomy    . Appendectomy    . Mastectomy w/ sentinel node biopsy  10/18/2011    Procedure: MASTECTOMY WITH SENTINEL LYMPH NODE BIOPSY;  Surgeon: Merrie Roof, MD;  Location: [facility];  Service: General;  Laterality: Bilateral;  bilateral mastectomy and left sentinel node biopsy  . Axillary lymph node dissection  10/18/2011    Procedure: AXILLARY LYMPH NODE DISSECTION;  Surgeon: Merrie Roof, MD;  Location: [facility];  Service: General;  Laterality: Left;  . Breast reconstruction  10/18/2011    Procedure: BREAST RECONSTRUCTION;  Surgeon: Theodoro Kos, DO;  Location: [facility];  Service: Plastics;  Laterality: Bilateral;   bilateral breast reconstruction with bilateral tissue expander and placement of flex hd.  . US echocardiography      at [place]  . Portacath placement  12/02/2011    Procedure: INSERTION PORT-A-CATH;  Surgeon: Merrie Roof, MD;  Location: [facility];  Service: General;  Laterality: Right;  . Coronary artery bypass graft  12/13/2011    Procedure: CORONARY ARTERY BYPASS GRAFTING (CABG);  Surgeon: Gaye Pollack, MD;  Location: [facility];  Service: Open Heart Surgery;  Laterality: N/A;  Times four using endoscopically harvested left greater saphenous vein and left internal mammary artery. Right greater saphenous vein attempted; not appropriate for vein harvest.  . Lesion removal  04/09/2012    Procedure: LESION REMOVAL;  Surgeon: Theodoro Kos, DO;  Location: [facility];  Service: Plastics;  Laterality: Bilateral;  . Port-a-cath removal  04/09/2012    Procedure: REMOVAL PORT-A-CATH;  Surgeon: Merrie Roof, MD;  Location: [facility];  Service: General;  Laterality: Right;  . Video bronchoscopy with endobronchial navigation Left 11/26/2013    Procedure: VIDEO BRONCHOSCOPY WITH ENDOBRONCHIAL NAVIGATION;  Surgeon: Collene Gobble, MD;  Location: [facility];  Service: Thoracic;  Laterality: Left;  . Left heart catheterization with coronary  angiogram N/A 12/11/2011    Procedure: LEFT HEART CATHETERIZATION WITH CORONARY ANGIOGRAM;  Surgeon: Larey Dresser, MD;  Location: [facility] CATH LAB;  Service: Cardiovascular;  Laterality: N/A;  no salpingo-oophorectomy  FAMILY HISTORY Family History  Problem Relation Age of Onset  . Anesthesia problems Neg Hx   . Hypotension Neg Hx   . Malignant hyperthermia Neg Hx   . Pseudochol deficiency Neg Hx   . Heart disease Mother   . Breast cancer Maternal Aunt   . Ovarian cancer Paternal Aunt   . Breast cancer Maternal Aunt   . Heart disease Father   The patient's father died at the age of 82. He had a history of asbestos exposure. The patient's mother died at the age of 3 from heart disease. The patient had one brother, no sisters. There is no breast or ovarian cancer in the immediate family but 2 of the patient's 5 maternal aunts had breast  cancer (she does not know the age of diagnosis), and one of her father's sisters had ovarian cancer.  GYNECOLOGIC HISTORY: She had menarche age 40, no firm date for her menopause as she had hysterectomy remotely. She is GX P2, with a pair of twins and a daughter as her biological children as noted below. She never took hormone replacement.  SOCIAL HISTORY:  (Updated January 2015) She used to own a tuxedo shop. Her husband, Shea Evans, is retired but used to own a Higher education careers adviser. He is currently working in [place] with one of his own sons. At home in addition to the patient and her husband is one of the patient's daughters and her 3 children (Josh 36, Shearon Balo, and Harmon Pier 2), as well as one of Forrest sons. Altogether they have 6 children and 15 grandchildren and 1 great granddaughter. The patient attends [employer]    ADVANCED DIRECTIVES: Not in place. The patient has received advanced directives/healthcare power of attorney papers to be completed on notarized at her discretion  HEALTH MAINTENANCE:  (Updated January 2015) History  Substance Use Topics  .  Smoking status: Former Smoker -- 2.00 packs/day for 40 years    Types: Cigarettes    Quit date: 08/25/2006  . Smokeless tobacco: Never Used  . Alcohol Use: Yes     Comment: 1-2 times a year     Colonoscopy: 2012  PAP: s/p hysterectomy  Bone density:  06/05/2012, Normal  Lipid panel:  August 2013, Dr. Harrell Lark   Allergies  Allergen Reactions  . Vicodin [Hydrocodone-Acetaminophen] Other (See Comments)    syncope  . Sulfa Antibiotics Other (See Comments)    Unknown; childhood allergy    Current Outpatient Prescriptions  Medication Sig Dispense Refill  . amLODipine (NORVASC) 5 MG tablet Take 1 tablet (5 mg total) by mouth daily. 30 tablet 6  . anastrozole (ARIMIDEX) 1 MG tablet Take 1 tablet (1 mg total) by mouth daily. 90 tablet 3  . aspirin 325 MG EC tablet Take 325 mg by mouth daily.    Marland Kitchen atorvastatin (LIPITOR) 80 MG tablet Take 1 tablet (80 mg total) by mouth daily at 6 PM. 90 tablet 3  . buPROPion (WELLBUTRIN XL) 300 MG 24 hr tablet Take 300 mg by mouth daily.     . fluticasone (FLONASE) 50 MCG/ACT nasal spray Place 2 sprays into both nostrils daily.     Marland Kitchen lisinopril (PRINIVIL,ZESTRIL) 10 MG tablet Take 1 tablet (10 mg total) by mouth daily. 90 tablet 3  . loratadine (CLARITIN) 10 MG tablet Take 10 mg by mouth daily.    . metFORMIN (GLUCOPHAGE) 500 MG tablet Take 1 tablet by mouth 2 (two) times daily.    . metoprolol (LOPRESSOR) 50 MG tablet TAKE ONE TABLET BY MOUTH TWICE DAILY 180 tablet 3  . mirabegron ER (MYRBETRIQ) 50 MG TB24 tablet Take 50 mg by mouth.    . Multiple Vitamin (MULTI VITAMIN DAILY PO) Take 1 tablet by mouth daily.    . nitrofurantoin, macrocrystal-monohydrate, (MACROBID) 100 MG capsule Take 1 capsule by mouth daily.    . nitroGLYCERIN (NITROSTAT) 0.4 MG SL tablet Place 1 tablet (0.4 mg total) under the tongue every 5 (five) minutes as needed. (Patient not taking: Reported on 02/20/2015) 25 tablet 3   No current facility-administered medications for this visit.     OBJECTIVE: Middle-aged white woman who appears stated age 69 Vitals:   02/20/15 0938  BP: 130/63  Pulse: 63  Temp: 98.2 F (36.8 C)  Resp:  19     Body mass index is 42.93 kg/(m^2).    ECOG FS: 2  Filed Weights   02/20/15 0938  Weight: 250 lb 3.2 oz (113.49 kg)   (weight was 266 pounds in August of 2014)  Skin: warm, dry  HEENT: sclerae anicteric, conjunctivae pink, oropharynx clear. No thrush or mucositis.  Lymph Nodes: No cervical or supraclavicular lymphadenopathy  Lungs: clear to auscultation bilaterally, no rales, wheezes, or rhonci  Heart: regular rate and rhythm  Abdomen: round, soft, non tender, positive bowel sounds  Musculoskeletal: No focal spinal tenderness, no peripheral edema  Neuro: non focal, well oriented, positive affect  Breasts: bilateral breasts status post mastectomies. Evidence of recurrent disease. Bilateral axillae benign.   LAB RESULTS:   Lab Results  Component Value Date   WBC 5.6 02/13/2015   NEUTROABS 3.3 02/13/2015   HGB 12.7 02/13/2015   HCT 40.0 02/13/2015   MCV 88.9 02/13/2015   PLT 159 02/13/2015      Chemistry      Component Value Date/Time   NA 142 02/13/2015 0933   NA 142 10/29/2014 0023   K 4.1 02/13/2015 0933   K 4.2 10/29/2014 0023   CL 107 10/29/2014 0023   CO2 26 02/13/2015 0933   CO2 25 10/29/2014 0023   BUN 15.0 02/13/2015 0933   BUN 17 10/29/2014 0023   CREATININE 0.8 02/13/2015 0933   CREATININE 0.97 10/29/2014 0023      Component Value Date/Time   CALCIUM 9.4 02/13/2015 0933   CALCIUM 9.6 10/29/2014 0023   ALKPHOS 78 02/13/2015 0933   ALKPHOS 95 10/29/2014 0023   AST 21 02/13/2015 0933   AST 24 10/29/2014 0023   ALT 26 02/13/2015 0933   ALT 17 10/29/2014 0023   BILITOT 0.39 02/13/2015 0933   BILITOT 0.4 10/29/2014 0023       STUDIES: Ct Chest W Contrast  02/13/2015   CLINICAL DATA:  History of left breast cancer with bilateral mastectomies.  EXAM: CT CHEST WITH CONTRAST  TECHNIQUE:  Multidetector CT imaging of the chest was performed during intravenous contrast administration.  CONTRAST:  49m OMNIPAQUE IOHEXOL 300 MG/ML  SOLN  COMPARISON:  08/17/2014.  FINDINGS: Mediastinum/Nodes: Mediastinal lymph nodes are not enlarged by CT size criteria. No hilar, axillary, subpectoral or internal mammary adenopathy. Surgical clips in both axillary regions. Atherosclerotic calcification of the arterial vasculature. Heart size normal. No pericardial effusion.  Lungs/Pleura: Suprahilar left upper lobe nodule measures approximately 5 x 7 mm (series 5, image 13), previously 7 x 10 mm. Associated mild volume loss and scarring in the surrounding left upper lobe. Lungs are otherwise clear. No pleural fluid. Airway is unremarkable.  Upper abdomen: Visualized portion of the liver is decreased in attenuation diffusely. Small stones layer in the gallbladder. Adrenal glands and visualized portion of the right kidney are unremarkable. Low-attenuation lesion in the lower pole left kidney measures 1.4 cm but is incompletely imaged. Visualized portions of the spleen and pancreas are unremarkable. Laparoscopic band is seen around the proximal stomach. Visualized portions of the stomach and bowel are otherwise grossly unremarkable.  Musculoskeletal: No worrisome lytic or sclerotic lesions. Degenerative changes are seen in the spine.  IMPRESSION: 1. Continued decrease in size of a nodular lesion in the left upper lobe. 2. Hepatic steatosis. 3. Cholelithiasis.   Electronically Signed   By: MLorin PicketM.D.   On: 02/13/2015 11:25     ASSESSMENT: 69y.o. [name] woman   (1)  s/p bilateral mastectomies and left  axillary lymph node dissection 10/18/2011 for a left-sided pT2 pN1, stage IIB invasive ductal carcinoma, grade 2, estrogen receptor 100% and progesterone receptor 96% positive, with no HER-2 amplification (1.81, repeat 1.35), Mib-1 of 30%. The right breast showed no malignancy.  (2) Plan was to treat in the  adjuvant setting with 4 dose dense cycles of doxorubicin/cyclophosphamide followed by 4 dose dense cycles of paclitaxel, with Doxorubicin given by continuous infusion over 72 hours. Patient received a truncated cycle 1, the continuous infusion disconnected after proximally 6 hours when the patient was admitted to the hospital with unstable angina.  (3)  During hospitalization, CT angio negative for PE, echo showing well-preserved EF, enzymes/ECG showing a NSTEMI, s/p cath May 7 showing left main, LAD and RCA disease with a well-preserved ejection fraction, revascularization performed 12/13/2011 with an uneventful postoperative course  (4) the patient opted against further chemotherapy and also against post-mastectomy irradiation; she started anastrozole May 2013  (a) bone density February 2016 showed osteopenia  (5) enlarging LUL mass noted on chest CT/ PET March 2015,   (a) biopsy 11/26/2013 showed no evidence of atypia or malignancy, but felt to be possibly discordant.  (b) Repeat chest CT 05/10/2014 and 08/17/2014 shows decreased prominence (post infectious scarring?)  (c) final chest CT 02/13/2015 showed continued decrease in size    PLAN:  Sierah is doing well as far as her breast cancer is concerned. She is now 3 years out from her definitive surgery with no evidence of recurrent disease. She is tolerating the anastrozole well and will continue this drug until May 2018 to complete 5 years of antiestrogen therapy.   She had a bone density scan in February 2016 that showed osteopenia, but the patient's PCP determined t-score is not low enough for her to treat with bisphosphonates.   We reviewed the details of her most recent chest CT and the previously identified lung nodule continues to shrink. We will conclude follow up of this lesion at this point.   Dorothea will return in 1 year for labs and a follow up visit. She understands and agrees with this plan. She knows the goal of treatment in her  case is cure. She has been encouraged to call with any issues that might arise before her next visit here.   Laurie Panda, NP     02/20/2015

## 2015-07-04 ENCOUNTER — Ambulatory Visit (HOSPITAL_COMMUNITY)
Admission: RE | Admit: 2015-07-04 | Discharge: 2015-07-04 | Disposition: A | Discharge status: Home / Self Care | Payer: Medicare Other | Source: Ambulatory Visit | Attending: Cardiovascular Disease | Admitting: Cardiovascular Disease

## 2015-07-04 DIAGNOSIS — E119 Type 2 diabetes mellitus without complications: Secondary | ICD-10-CM | POA: Insufficient documentation

## 2015-07-04 DIAGNOSIS — I6523 Occlusion and stenosis of bilateral carotid arteries: Secondary | ICD-10-CM | POA: Diagnosis not present

## 2015-07-04 DIAGNOSIS — I10 Essential (primary) hypertension: Secondary | ICD-10-CM | POA: Diagnosis not present

## 2015-07-04 DIAGNOSIS — E785 Hyperlipidemia, unspecified: Secondary | ICD-10-CM | POA: Diagnosis not present

## 2015-07-05 ENCOUNTER — Telehealth: Payer: Self-pay | Admitting: *Deleted

## 2015-07-05 ENCOUNTER — Encounter: Payer: Self-pay | Admitting: Physician Assistant

## 2015-07-05 DIAGNOSIS — I6523 Occlusion and stenosis of bilateral carotid arteries: Secondary | ICD-10-CM

## 2015-07-05 NOTE — Telephone Encounter (Signed)
Pt notified of Carotid results. Pt agreeable to repeat Carotid U/S in 1 year.

## 2015-09-06 ENCOUNTER — Other Ambulatory Visit: Payer: Self-pay | Admitting: Physician Assistant

## 2015-10-03 ENCOUNTER — Other Ambulatory Visit: Payer: Self-pay | Admitting: Physician Assistant

## 2015-11-16 ENCOUNTER — Other Ambulatory Visit: Payer: Self-pay | Admitting: Physician Assistant

## 2015-12-15 ENCOUNTER — Other Ambulatory Visit: Payer: Self-pay | Admitting: Oncology

## 2015-12-27 ENCOUNTER — Other Ambulatory Visit: Payer: Self-pay | Admitting: Physician Assistant

## 2015-12-27 NOTE — Telephone Encounter (Signed)
Rx refill sent to pharmacy. 

## 2016-01-25 ENCOUNTER — Other Ambulatory Visit: Payer: Self-pay | Admitting: Physician Assistant

## 2016-01-25 NOTE — Telephone Encounter (Signed)
amLODipine (NORVASC) 5 MG tablet  Medication   Date: 11/17/2015  Department: [address] Office  Ordering/Authorizing: Liliane Shi, PA-C      Order Providers    Prescribing Provider Encounter Provider   Liliane Shi, PA-C Liliane Shi, PA-C    Medication Detail      Disp Refills Start End     amLODipine (NORVASC) 5 MG tablet 30 tablet 1 11/17/2015     Sig: TAKE ONE TABLET BY MOUTH ONCE DAILY    Notes to Pharmacy: Patient is overdue for an appointment. Please call and schedule for further refills    E-Prescribing Status: Receipt confirmed by pharmacy (11/17/2015 9:23 AM EDT)     Pharmacy    [facility] [place] [place] [address]

## 2016-02-16 ENCOUNTER — Other Ambulatory Visit: Payer: Self-pay

## 2016-02-16 DIAGNOSIS — C50812 Malignant neoplasm of overlapping sites of left female breast: Secondary | ICD-10-CM

## 2016-02-16 DIAGNOSIS — Z170 Estrogen receptor positive status [ER+]: Secondary | ICD-10-CM

## 2016-02-18 NOTE — Progress Notes (Signed)
ID: Jamie [name]   DOB: 12-Mar-1946  MR#: [id number]  KJZ#:[id number]   PCP: Verdell Carmine., MD GYN: SURGAutumn Messing, MD;  Theodoro Kos, DO OTHER:  Loralie Champagne, MD;  Lance Morin, MD, Baltazar Apo MD  CHIEF COMPLAINT:  Estrogen receptor positive breast cancer  CURRENT TREATMENT: Anastrozole  HISTORY OF PRESENT ILLNESS: From the original intake note:  The patient noted a mass in her left breast and brought it to the attention of her primary physician, Dr. Ward Givens, who set her up for diagnostic mammography. This showed a mass of about 3 cm in diameter (I do not have a copy of that study or comparison with prior mammography from July of 2012). The patient was referred to Dr. Marlou Starks and he set her up for biopsy of the mass 08/28/2011 [facility]. The pathology showed [phone]) an invasive ductal carcinoma, grade 2, estrogen receptor 100% positive, progesterone receptor 96% positive, with an MIB-1 of 30%, and HER-2 equivocal with the ratio by CISH of 1.81.  Diagnostic right mammography was then performed 08/28/2011. Multiple masses were noted, and there were multiple soft tissue nodules by palpation. Ultrasound specifically showed 2 hypoechoic round masses measuring 1.3 and 0.9 cm respectively. 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 With this information and after extensive discussion the patient opted for bilateral mastectomies with left sentinel lymph node sampling, and this was performed 10/18/2011.  Her subsequent history is as detailed below  INTERVAL HISTORY: Jamie [name] returns today for long-term follow up of her estrogen receptor positive breast carcinoma. She continues on anastrozole with excellent tolerance. Hot flashes and vaginal dryness are not a major issue. She never developed the arthralgias or myalgias that many patients can experience on this medication. She obtains it at a good price.  REVIEW OF  SYSTEMS: [name] has multiple other medical problems including some lower extremity edema and cellulitis which is being treated with antibiotics. She had multiple episodes of bronchitis this past winter and pneumonia as well. Currently she just as a little bit of a runny nose. She does feel short of breath with activity and is very deconditioned. She rarely has upper rectum cough. She bruises easily. She is a bit depressed. Sometimes she has problems finding words and she has a poor short-term memory but she gives me a very good account of this which is not at all suggestive of Alzheimer's. A detailed review of systems today was otherwise stable  PAST MEDICAL HISTORY: Past Medical History  Diagnosis Date  . Asthma   . COPD (chronic obstructive pulmonary disease) ([place])   . UTI (lower urinary tract infection)     freq-bladder implant -removed  . Hx of migraines   . CAD (coronary artery disease)     NSTEMI 5/13:  LHC demonstrated oLM 75%, pLAD 80%, RCA occluded, filled by collaterals from the LAD.  Echo 12/10/11: EF 60%.; CABG 12/13/11: LIMA-LAD, SVG-RCA, SVG-diagonal and OM.   Marland Kitchen HLD (hyperlipidemia)   . Carotid stenosis     a.  Pre-CABG Dopplers 5/13: Bilateral 40-59%; b.  Carotid US 7/54: RICA 49-20%, LICA 10-07%, normal subclavians bilaterally >> FU 6 months;  c. Carotid US 12/19: RICA 75-88%; LICA 32-54%, normal subclavians >> FU 1 year  . Myocardial infarction ([place])   . Obesity   . Sleep apnea     sleep study2-3 yrs ago lost weight afterward but gained back  . Shortness of breath   . Anxiety   .  Hypertension   . Breast cancer ([place]) 08/2011    lt. breast ca  . DM2 (diabetes mellitus, type 2) ([facility])     metformin  . Hx of echocardiogram     Echo 5/16:  EF 60-65%, no RWMA, Gr 1 DD, Ao sclerosis, mild AS, mean 14 mmHg, MAC  . Hx of cardiovascular stress test     Myoview 5/16:  small area of inf-lat ischemia, EF 70%; Low Risk >> try medical Rx  hx tobacco abuse, 80 P/Y, resolved  PAST SURGICAL  HISTORY: Past Surgical History  Procedure Laterality Date  . Cesarean section  04/1976  . Abdominal hysterectomy  1977  . Laparoscopic gastric banding  2010  . Nasal sinus surgery    . Tonsillectomy    . Appendectomy    . Mastectomy w/ sentinel node biopsy  10/18/2011    Procedure: MASTECTOMY WITH SENTINEL LYMPH NODE BIOPSY;  Surgeon: Merrie Roof, MD;  Location: [facility];  Service: General;  Laterality: Bilateral;  bilateral mastectomy and left sentinel node biopsy  . Axillary lymph node dissection  10/18/2011    Procedure: AXILLARY LYMPH NODE DISSECTION;  Surgeon: Merrie Roof, MD;  Location: [facility];  Service: General;  Laterality: Left;  . Breast reconstruction  10/18/2011    Procedure: BREAST RECONSTRUCTION;  Surgeon: Theodoro Kos, DO;  Location: [facility];  Service: Plastics;  Laterality: Bilateral;   bilateral breast reconstruction with bilateral tissue expander and placement of flex hd.  . US echocardiography      at [place] [facility]  . Portacath placement  12/02/2011    Procedure: INSERTION PORT-A-CATH;  Surgeon: Merrie Roof, MD;  Location: [facility];  Service: General;  Laterality: Right;  . Coronary artery bypass graft  12/13/2011    Procedure: CORONARY ARTERY BYPASS GRAFTING (CABG);  Surgeon: Gaye Pollack, MD;  Location: [facility];  Service: Open Heart Surgery;  Laterality: N/A;  Times four using endoscopically harvested left greater saphenous vein and left internal mammary artery. Right greater saphenous vein attempted; not appropriate for vein harvest.  . Lesion removal  04/09/2012    Procedure: LESION REMOVAL;  Surgeon: Theodoro Kos, DO;  Location: [facility];  Service: Plastics;  Laterality: Bilateral;  . Port-a-cath removal  04/09/2012    Procedure: REMOVAL PORT-A-CATH;  Surgeon: Merrie Roof, MD;  Location: [facility];  Service: General;  Laterality: Right;  . Video bronchoscopy with endobronchial navigation Left 11/26/2013    Procedure: VIDEO BRONCHOSCOPY  WITH ENDOBRONCHIAL NAVIGATION;  Surgeon: Collene Gobble, MD;  Location: [facility];  Service: Thoracic;  Laterality: Left;  . Left heart catheterization with coronary angiogram N/A 12/11/2011    Procedure: LEFT HEART CATHETERIZATION WITH CORONARY ANGIOGRAM;  Surgeon: Larey Dresser, MD;  Location: [facility] CATH LAB;  Service: Cardiovascular;  Laterality: N/A;  no salpingo-oophorectomy  FAMILY HISTORY Family History  Problem Relation Age of Onset  . Anesthesia problems Neg Hx   . Hypotension Neg Hx   . Malignant hyperthermia Neg Hx   . Pseudochol deficiency Neg Hx   . Heart disease Mother   . Breast cancer Maternal Aunt   . Ovarian cancer Paternal Aunt   . Breast cancer Maternal Aunt   . Heart disease Father   The patient's father died at the age of 83. He had a history of asbestos exposure. The patient's mother died at the age of 78 from heart disease. The patient had one brother, no sisters. There is no  breast or ovarian cancer in the immediate family but 2 of the patient's 5 maternal aunts had breast cancer (she does not know the age of diagnosis), and one of her father's sisters had ovarian cancer.  GYNECOLOGIC HISTORY: She had menarche age 22, no firm date for her menopause as she had hysterectomy remotely. She is GX P2, with a pair of twins and a daughter as her biological children as noted below. She never took hormone replacement.  SOCIAL HISTORY:  (Updated January 2015) She used to own a tuxedo shop. Her husband, Shea Evans, is retired but used to own a Higher education careers adviser. He is currently working in [place] with one of his own sons. At home in addition to the patient and her husband is one of the patient's daughters and her 3 children (Josh 38, Shearon Balo, and Harmon Pier 2), as well as one of Forrest sons. Altogether they have 6 children and 15 grandchildren and 1 great granddaughter. The patient attends [employer]    ADVANCED DIRECTIVES: Not in place. The patient has received advanced  directives/healthcare power of attorney papers to be completed on notarized at her discretion  HEALTH MAINTENANCE:  (Updated January 2015) Social History  Substance Use Topics  . Smoking status: Former Smoker -- 2.00 packs/day for 40 years    Types: Cigarettes    Quit date: 08/25/2006  . Smokeless tobacco: Never Used  . Alcohol Use: Yes     Comment: 1-2 times a year     Colonoscopy: 2012  PAP: s/p hysterectomy  Bone density:  06/05/2012, Normal  Lipid panel:  August 2013, Dr. Harrell Lark   Allergies  Allergen Reactions  . Vicodin [Hydrocodone-Acetaminophen] Other (See Comments)    syncope  . Sulfa Antibiotics Other (See Comments)    Unknown; childhood allergy    Current Outpatient Prescriptions  Medication Sig Dispense Refill  . amLODipine (NORVASC) 5 MG tablet Take 1 tablet (5 mg total) by mouth daily. 15 tablet 0  . anastrozole (ARIMIDEX) 1 MG tablet TAKE ONE TABLET BY MOUTH ONCE DAILY 90 tablet 0  . aspirin 325 MG EC tablet Take 325 mg by mouth daily.    Marland Kitchen atorvastatin (LIPITOR) 80 MG tablet Take 1 tablet (80 mg total) by mouth daily at 6 PM. 90 tablet 3  . buPROPion (WELLBUTRIN XL) 300 MG 24 hr tablet Take 300 mg by mouth daily.     . fluticasone (FLONASE) 50 MCG/ACT nasal spray Place 2 sprays into both nostrils daily.     Marland Kitchen lisinopril (PRINIVIL,ZESTRIL) 10 MG tablet TAKE ONE TABLET BY MOUTH ONCE DAILY 90 tablet 0  . loratadine (CLARITIN) 10 MG tablet Take 10 mg by mouth daily.    . metFORMIN (GLUCOPHAGE) 500 MG tablet Take 1 tablet by mouth 2 (two) times daily.    . metoprolol (LOPRESSOR) 50 MG tablet TAKE ONE TABLET BY MOUTH TWICE DAILY 180 tablet 0  . mirabegron ER (MYRBETRIQ) 50 MG TB24 tablet Take 50 mg by mouth.    . Multiple Vitamin (MULTI VITAMIN DAILY PO) Take 1 tablet by mouth daily.    . nitrofurantoin, macrocrystal-monohydrate, (MACROBID) 100 MG capsule Take 1 capsule by mouth daily.    . nitroGLYCERIN (NITROSTAT) 0.4 MG SL tablet Place 1 tablet (0.4 mg total) under  the tongue every 5 (five) minutes as needed. (Patient not taking: Reported on 02/20/2015) 25 tablet 3   No current facility-administered medications for this visit.    OBJECTIVE: Middle-aged white woman In no acute distress Filed Vitals:   02/19/16  1034  BP: 138/82  Pulse: 58  Temp: 97.9 F (36.6 C)  Resp: 18     Body mass index is 44.9 kg/(m^2).    ECOG FS: 2  Filed Weights   02/19/16 1034  Weight: 261 lb 11.2 oz (118.706 kg)   (weight was 266 pounds in August of 2014)  Sclerae unicteric, EOMs intact Oropharynx clear and moist No cervical or supraclavicular adenopathy Lungs no rales or rhonchi Heart regular rate and rhythm Abd soft, obese, nontender, positive bowel sounds MSK kyphosis but no focal spinal tenderness, no upper extremity lymphedema Neuro: nonfocal, well oriented, appropriate affect Breasts: Deferred  LAB RESULTS:   Lab Results  Component Value Date   WBC 6.7 02/19/2016   NEUTROABS 4.2 02/19/2016   HGB 12.4 02/19/2016   HCT 38.7 02/19/2016   MCV 87.4 02/19/2016   PLT 181 02/19/2016      Chemistry      Component Value Date/Time   NA 142 02/13/2015 0933   NA 142 10/29/2014 0023   K 4.1 02/13/2015 0933   K 4.2 10/29/2014 0023   CL 107 10/29/2014 0023   CO2 26 02/13/2015 0933   CO2 25 10/29/2014 0023   BUN 15.0 02/13/2015 0933   BUN 17 10/29/2014 0023   CREATININE 0.8 02/13/2015 0933   CREATININE 0.97 10/29/2014 0023      Component Value Date/Time   CALCIUM 9.4 02/13/2015 0933   CALCIUM 9.6 10/29/2014 0023   ALKPHOS 78 02/13/2015 0933   ALKPHOS 95 10/29/2014 0023   AST 21 02/13/2015 0933   AST 24 10/29/2014 0023   ALT 26 02/13/2015 0933   ALT 17 10/29/2014 0023   BILITOT 0.39 02/13/2015 0933   BILITOT 0.4 10/29/2014 0023       STUDIES: No results found.   ASSESSMENT: 70 y.o. [name] woman   (1)  s/p bilateral mastectomies and left axillary lymph node dissection 10/18/2011 for a left-sided Invasive ductal carcinoma involving  overlapping sites,  pT2 pN1, stage IIB, grade 2, estrogen receptor 100% and progesterone receptor 96% positive, with no HER-2 amplification (1.81, repeat 1.35), Mib-1 of 30%.   (a) The right breast showed no malignancy.  (2) Plan was to treat in the adjuvant setting with 4 dose dense cycles of doxorubicin/cyclophosphamide followed by 4 dose dense cycles of paclitaxel, with Doxorubicin given by continuous infusion over 72 hours. Patient received a truncated cycle 1, the continuous infusion disconnected after proximally 6 hours when the patient was admitted to the hospital with unstable angina.  (3)  During hospitalization, CT angio negative for PE, echo showing well-preserved EF, enzymes/ECG showing a NSTEMI, s/p cath May 7 showing left main, LAD and RCA disease with a well-preserved ejection fraction, revascularization performed 12/13/2011 with an uneventful postoperative course  (4) the patient opted against further chemotherapy and also against post-mastectomy irradiation; she started anastrozole May 2013  (a) bone density February 2016 showed osteopenia  (5) enlarging LUL mass noted on chest CT/ PET March 2015,   (a) biopsy 11/26/2013 showed no evidence of atypia or malignancy, but felt to be possibly discordant.  (b) Repeat chest CT 05/10/2014 and 08/17/2014 shows decreased prominence (post infectious scarring?)  (c) final chest CT 02/13/2015 showed continued decrease in size    PLAN:  Joaquina is now a little over 4 years out from definitive surgery for breast cancer with no evidence of disease recurrence. This is very favorable.  She is tolerating the anastrozole well. The plan will be to continue that for one more  year, at which time she will be ready to "graduate".  Her cellulitis is improved but not entirely cleared. I have suggested that she keep her legs up as much as she can during the day. If there has been no further improvement by later this week she make a primary care physician  again.  She knows to call for any problems that may develop before her next visit here. Chauncey Cruel, MD     02/19/2016

## 2016-02-19 ENCOUNTER — Other Ambulatory Visit (HOSPITAL_BASED_OUTPATIENT_CLINIC_OR_DEPARTMENT_OTHER): Payer: Medicare Other

## 2016-02-19 ENCOUNTER — Telehealth: Payer: Self-pay | Admitting: Oncology

## 2016-02-19 ENCOUNTER — Ambulatory Visit (HOSPITAL_BASED_OUTPATIENT_CLINIC_OR_DEPARTMENT_OTHER): Payer: Medicare Other | Admitting: Oncology

## 2016-02-19 VITALS — BP 138/82 | HR 58 | Temp 97.9°F | Resp 18 | Ht 64.0 in | Wt 261.7 lb

## 2016-02-19 DIAGNOSIS — C50812 Malignant neoplasm of overlapping sites of left female breast: Secondary | ICD-10-CM | POA: Diagnosis present

## 2016-02-19 DIAGNOSIS — R918 Other nonspecific abnormal finding of lung field: Secondary | ICD-10-CM | POA: Diagnosis not present

## 2016-02-19 DIAGNOSIS — M8580 Other specified disorders of bone density and structure, unspecified site: Secondary | ICD-10-CM

## 2016-02-19 LAB — CBC WITH DIFFERENTIAL/PLATELET
BASO%: 0.4 % (ref 0.0–2.0)
Basophils Absolute: 0 10*3/uL (ref 0.0–0.1)
EOS%: 2.9 % (ref 0.0–7.0)
Eosinophils Absolute: 0.2 10*3/uL (ref 0.0–0.5)
HCT: 38.7 % (ref 34.8–46.6)
HGB: 12.4 g/dL (ref 11.6–15.9)
LYMPH%: 24.3 % (ref 14.0–49.7)
MCH: 28 pg (ref 25.1–34.0)
MCHC: 32 g/dL (ref 31.5–36.0)
MCV: 87.4 fL (ref 79.5–101.0)
MONO#: 0.6 10*3/uL (ref 0.1–0.9)
MONO%: 8.9 % (ref 0.0–14.0)
NEUT#: 4.2 10*3/uL (ref 1.5–6.5)
NEUT%: 63.5 % (ref 38.4–76.8)
PLATELETS: 181 10*3/uL (ref 145–400)
RBC: 4.43 10*6/uL (ref 3.70–5.45)
RDW: 14.1 % (ref 11.2–14.5)
WBC: 6.7 10*3/uL (ref 3.9–10.3)
lymph#: 1.6 10*3/uL (ref 0.9–3.3)

## 2016-02-19 LAB — COMPREHENSIVE METABOLIC PANEL
ALT: 19 U/L (ref 0–55)
ANION GAP: 9 meq/L (ref 3–11)
AST: 20 U/L (ref 5–34)
Albumin: 3.5 g/dL (ref 3.5–5.0)
Alkaline Phosphatase: 71 U/L (ref 40–150)
BILIRUBIN TOTAL: 0.42 mg/dL (ref 0.20–1.20)
BUN: 16.5 mg/dL (ref 7.0–26.0)
CALCIUM: 9.3 mg/dL (ref 8.4–10.4)
CO2: 26 mEq/L (ref 22–29)
Chloride: 106 mEq/L (ref 98–109)
Creatinine: 0.9 mg/dL (ref 0.6–1.1)
EGFR: 62 mL/min/{1.73_m2} — ABNORMAL LOW (ref >90)
Glucose: 87 mg/dl (ref 70–140)
Potassium: 4.3 mEq/L (ref 3.5–5.1)
Sodium: 142 mEq/L (ref 136–145)
Total Protein: 6.9 g/dL (ref 6.4–8.3)

## 2016-02-19 NOTE — Telephone Encounter (Signed)
appt made and avs printed °

## 2016-02-23 ENCOUNTER — Other Ambulatory Visit: Payer: Self-pay | Admitting: Physician Assistant

## 2016-03-10 ENCOUNTER — Other Ambulatory Visit: Payer: Self-pay | Admitting: Physician Assistant

## 2016-03-11 ENCOUNTER — Other Ambulatory Visit: Payer: Self-pay | Admitting: *Deleted

## 2016-03-11 MED ORDER — AMLODIPINE BESYLATE 5 MG PO TABS: 5.0000 mg | ORAL_TABLET | Freq: Every day | ORAL | 0 refills | Status: DC

## 2016-03-23 ENCOUNTER — Other Ambulatory Visit: Payer: Self-pay | Admitting: Physician Assistant

## 2016-03-23 ENCOUNTER — Other Ambulatory Visit: Payer: Self-pay | Admitting: Cardiology

## 2016-04-06 ENCOUNTER — Other Ambulatory Visit: Payer: Self-pay | Admitting: Oncology

## 2016-04-22 ENCOUNTER — Other Ambulatory Visit: Payer: Self-pay | Admitting: Physician Assistant

## 2016-04-22 ENCOUNTER — Other Ambulatory Visit: Payer: Self-pay | Admitting: Cardiology

## 2016-04-25 ENCOUNTER — Encounter: Payer: Self-pay | Admitting: Physician Assistant

## 2016-04-25 NOTE — Progress Notes (Signed)
Cardiology Office Note:    Date:  04/26/2016   ID:  Jamie [name], DOB 20-Apr-1946, MRN [id number]  PCP:  Verdell Carmine., MD  Cardiologist:  Dr. Loralie Champagne   Electrophysiologist:  N/a Oncologist:  Chauncey Cruel, MD  Referring MD: Verdell Carmine., MD   Chief Complaint  Patient presents with  . Follow-up    CAD   History of Present Illness:    Jamie [name] is a 70 y.o. female with a hx of CAD, breast CA s/p bilateral mastectomies, DM, sleep apnea, COPD, obesity, HL, carotid aretery disease.  She was admitted with a NSTEMI and diastolic HF in [id number].  LHC demonstrated ostial left main disease. She underwent bypass with Dr. Cyndia Bent (LIMA-LAD, SVG-RCA, SVG-diagonal and OM).  Lexiscan Myoview in 5/16 demonstrated inferolateral ischemia of a small degree. The study was felt to be low risk and EF > 65%.  She was placed on long acting Nitrates to advance medical therapy, but had to stop due to HAs.  She was changed to Amlodipine.  If she has worsening or refractory symptoms, she will need to undergo LHC.  Echo in 5/16 demonstrated normal LV function with mild diastolic dysfunction and mild aortic stenosis with a mean gradient of 14 mmHg.  Last seen by me in 6/16.    She returns for follow-up. She is here alone. She currently has bronchitis and has been treated with antibiotics by her PCP. She is slowly improving. Prior to getting sick, she denies having significant changes in her chronic dyspnea with exertion. She denies orthopnea, PND. She has mild pedal edema that is unchanged. She denies chest discomfort. She denies syncope.  Prior CV studies that were reviewed today include:    Carotid US [id number]:  RICA 123456; LICA 123456, normal subclavians >> FU 1 year  Myoview 12/14/14 Myocardial perfusion is abnormal. Findings consistent with basal inferolateral ischemia. This is a low risk study. Overall left ventricular systolic function was normal. LV cavity size is normal. The left ventricular  ejection fraction is hyperdynamic (>65%). There is no prior study for comparison.  Echo 12/13/14 EF 60-65%, Normal wall motion, grade 1 diastolic dysfunction, aortic sclerosis, mild aortic stenosis, mean gradient 14 mmHg, MAC   Echo 02/04/13 - EF 60% to 65%. Wall motion was normal;   - Aortic valve: Valve area: 3.07cm^2(VTI). Valve area: 3.14cm^2 (Vmax).  Past Medical History:  Diagnosis Date  . Anxiety   . Asthma   . Breast cancer ([place]) 08/2011   lt. breast ca  . CAD (coronary artery disease)    NSTEMI 5/13:  LHC demonstrated oLM 75%, pLAD 80%, RCA occluded, filled by collaterals from the LAD.  Echo 12/10/11: EF 60%.; CABG 12/13/11: LIMA-LAD, SVG-RCA, SVG-diagonal and OM.   Marland Kitchen Carotid stenosis    a.  Pre-CABG Dopplers 5/13: Bilateral 40-59%; b.  Carotid US [id number]: RICA 123456, LICA [id number], normal subclavians bilaterally >> FU 6 months;  c. Carotid US [id number]: RICA 123456; LICA 123456, normal subclavians >> FU 1 year  . COPD (chronic obstructive pulmonary disease) ([place])   . DM2 (diabetes mellitus, type 2) ([facility])    metformin  . HLD (hyperlipidemia)   . Hx of cardiovascular stress test    Myoview 5/16:  small area of inf-lat ischemia, EF 70%; Low Risk >> try medical Rx  . Hx of echocardiogram    Echo 5/16:  EF 60-65%, no RWMA, Gr 1 DD, Ao sclerosis, mild AS, mean 14 mmHg, MAC  . Hx of migraines   .  Hypertension   . Myocardial infarction ([place])   . Obesity   . Shortness of breath   . Sleep apnea    sleep study2-3 yrs ago lost weight afterward but gained back  . UTI (lower urinary tract infection)    freq-bladder implant -removed    Past Surgical History:  Procedure Laterality Date  . ABDOMINAL HYSTERECTOMY  1977  . APPENDECTOMY    . AXILLARY LYMPH NODE DISSECTION  10/18/2011   Procedure: AXILLARY LYMPH NODE DISSECTION;  Surgeon: Merrie Roof, MD;  Location: [facility];  Service: General;  Laterality: Left;  . BREAST RECONSTRUCTION  10/18/2011   Procedure: BREAST RECONSTRUCTION;  Surgeon:  Theodoro Kos, DO;  Location: [facility];  Service: Plastics;  Laterality: Bilateral;   bilateral breast reconstruction with bilateral tissue expander and placement of flex hd.  . CESAREAN SECTION  04/1976  . CORONARY ARTERY BYPASS GRAFT  12/13/2011   Procedure: CORONARY ARTERY BYPASS GRAFTING (CABG);  Surgeon: Gaye Pollack, MD;  Location: [facility];  Service: Open Heart Surgery;  Laterality: N/A;  Times four using endoscopically harvested left greater saphenous vein and left internal mammary artery. Right greater saphenous vein attempted; not appropriate for vein harvest.  . LAPAROSCOPIC GASTRIC BANDING  2010  . LEFT HEART CATHETERIZATION WITH CORONARY ANGIOGRAM N/A 12/11/2011   Procedure: LEFT HEART CATHETERIZATION WITH CORONARY ANGIOGRAM;  Surgeon: Larey Dresser, MD;  Location: [facility] CATH LAB;  Service: Cardiovascular;  Laterality: N/A;  . LESION REMOVAL  04/09/2012   Procedure: LESION REMOVAL;  Surgeon: Theodoro Kos, DO;  Location: [facility];  Service: Plastics;  Laterality: Bilateral;  . MASTECTOMY W/ SENTINEL NODE BIOPSY  10/18/2011   Procedure: MASTECTOMY WITH SENTINEL LYMPH NODE BIOPSY;  Surgeon: Merrie Roof, MD;  Location: [facility];  Service: General;  Laterality: Bilateral;  bilateral mastectomy and left sentinel node biopsy  . NASAL SINUS SURGERY    . PORT-A-CATH REMOVAL  04/09/2012   Procedure: REMOVAL PORT-A-CATH;  Surgeon: Merrie Roof, MD;  Location: [facility];  Service: General;  Laterality: Right;  . PORTACATH PLACEMENT  12/02/2011   Procedure: INSERTION PORT-A-CATH;  Surgeon: Merrie Roof, MD;  Location: [facility];  Service: General;  Laterality: Right;  . TONSILLECTOMY    . US ECHOCARDIOGRAPHY     at [facility] NAVIGATION Left 11/26/2013   Procedure: VIDEO BRONCHOSCOPY WITH ENDOBRONCHIAL NAVIGATION;  Surgeon: Collene Gobble, MD;  Location: [facility];  Service: Thoracic;  Laterality: Left;    Current  Medications: Outpatient Medications Prior to Visit  Medication Sig Dispense Refill  . amLODipine (NORVASC) 5 MG tablet TAKE ONE TABLET BY MOUTH ONCE DAILY 15 tablet 0  . anastrozole (ARIMIDEX) 1 MG tablet TAKE ONE TABLET BY MOUTH ONCE DAILY 90 tablet 0  . atorvastatin (LIPITOR) 80 MG tablet TAKE ONE TABLET BY MOUTH ONCE DAILY AT  6PM 15 tablet 0  . buPROPion (WELLBUTRIN XL) 300 MG 24 hr tablet Take 300 mg by mouth daily.     . fluticasone (FLONASE) 50 MCG/ACT nasal spray Place 2 sprays into both nostrils daily.     Marland Kitchen loratadine (CLARITIN) 10 MG tablet Take 10 mg by mouth daily.    . metFORMIN (GLUCOPHAGE) 500 MG tablet Take 1 tablet by mouth 2 (two) times daily.    . metoprolol (LOPRESSOR) 50 MG tablet TAKE ONE TABLET BY MOUTH TWICE DAILY 30 tablet 0  . mirabegron ER (MYRBETRIQ) 50 MG TB24  tablet Take 50 mg by mouth.    . Multiple Vitamin (MULTI VITAMIN DAILY PO) Take 1 tablet by mouth daily.    . nitrofurantoin, macrocrystal-monohydrate, (MACROBID) 100 MG capsule Take 1 capsule by mouth daily.    . nitroGLYCERIN (NITROSTAT) 0.4 MG SL tablet Place 1 tablet (0.4 mg total) under the tongue every 5 (five) minutes as needed. 25 tablet 3  . aspirin 325 MG EC tablet Take 325 mg by mouth daily.    Marland Kitchen lisinopril (PRINIVIL,ZESTRIL) 10 MG tablet TAKE ONE TABLET BY MOUTH ONCE DAILY 15 tablet 0   No facility-administered medications prior to visit.       Allergies:   Hydrocodone-acetaminophen; Vicodin [hydrocodone-acetaminophen]; Sulfa antibiotics; Escitalopram; and Sulfamethoxazole   Social History   Social History  . Marital status: Married    Spouse name: N/A  . Number of children: 6  . Years of education: N/A   Occupational History  . retired    Social History Main Topics  . Smoking status: Former Smoker    Packs/day: 2.00    Years: 40.00    Types: Cigarettes    Quit date: 08/25/2006  . Smokeless tobacco: Never Used  . Alcohol use Yes     Comment: 1-2 times a year  . Drug use: No  .  Sexual activity: Not Currently   Other Topics Concern  . None   Social History Narrative  . None     Family History:  The patient's family history includes Breast cancer in her maternal aunt and maternal aunt; Heart disease in her father and mother; Ovarian cancer in her paternal aunt.   ROS:   Please see the history of present illness.    Review of Systems  Constitution: Positive for diaphoresis and malaise/fatigue.  HENT: Positive for hearing loss.   Eyes: Positive for visual disturbance.  Cardiovascular: Positive for dyspnea on exertion and leg swelling.  Respiratory: Positive for cough and wheezing.   Hematologic/Lymphatic: Bruises/bleeds easily.  Neurological: Positive for loss of balance.  Psychiatric/Behavioral: Positive for depression.   All other systems reviewed and are negative.   EKGs/Labs/Other Test Reviewed:    EKG:  EKG is  ordered today.  The ekg ordered today demonstrates NSR, HR 70, normal axis, nonspecific ST-T wave changes, QTc 436 ms, no change from prior tracing  Recent Labs: 02/19/2016: ALT 19; BUN 16.5; Creatinine 0.9; HGB 12.4; Platelets 181; Potassium 4.3; Sodium 142   Recent Lipid Panel    Component Value Date/Time   CHOL 126 03/10/2012 1015   TRIG 182.0 (H) 03/10/2012 1015   HDL 41.10 03/10/2012 1015   CHOLHDL 3 03/10/2012 1015   VLDL 36.4 03/10/2012 1015   LDLCALC 49 03/10/2012 1015     Physical Exam:    VS:  BP 140/60   Pulse 70   Ht 5\' 5"  (1.651 m)   Wt 256 lb 6.4 oz (116.3 kg)   BMI 42.67 kg/m     Wt Readings from Last 3 Encounters:  04/26/16 256 lb 6.4 oz (116.3 kg)  02/19/16 261 lb 11.2 oz (118.7 kg)  02/20/15 250 lb 3.2 oz (113.5 kg)     Physical Exam  Constitutional: She is oriented to person, place, and time. She appears well-developed and well-nourished. No distress.  HENT:  Head: Normocephalic and atraumatic.  Eyes: No scleral icterus.  Neck: Normal range of motion. No JVD present.  Cardiovascular: Normal rate,  regular rhythm, S1 normal and S2 normal.  Exam reveals no gallop and no friction rub.  Murmur heard.  Harsh systolic murmur is present with a grade of 2/6  at the upper right sternal border Pulmonary/Chest: She has decreased breath sounds. She has no wheezes. She has no rhonchi. She has no rales.  Abdominal: Soft. There is no tenderness.  Musculoskeletal: She exhibits no edema.  Neurological: She is alert and oriented to person, place, and time.  Skin: Skin is warm and dry.  Psychiatric: She has a normal mood and affect.    ASSESSMENT:    1. Coronary artery disease involving native coronary artery of native heart with angina pectoris ([place])   2. Essential hypertension   3. Pure hypercholesterolemia   4. Bilateral carotid artery disease ([place])   5. Aortic stenosis    PLAN:    In order of problems listed above:  1. Coronary artery disease:   Status post CABG in 2013. Nuclear stress test last year with low risk findings and suggestion of inferolateral ischemia. Medical therapy was initially recommended. She is currently doing well without recurrent angina on current medical therapy which includes amlodipine, metoprolol, aspirin, Lipitor, lisinopril. She remains on aspirin 325 mg daily. This will be reduced to 81 mg daily.  2. Hypertension:   Blood pressure borderline today. I suspect this is likely related to her recent acute illness. Continue to monitor.  3. Hyperlipidemia:  Continue statin. Followed by PCP. Will request recent labs.   4. Carotid artery disease:   Carotid US 11/16 with bilateral ICA 40-59% stenosis. Repeat US in 11/17.Continue statin, aspirin.  5. Aortic stenosis:  Mild by recent echocardiogram.  No symptoms to suggest worsening. Consider arranging follow-up echo at next visit.  Disposition: We discussed Dr. Claris Gladden move to the heart failure clinic. She prefers to follow-up here with Dr. Harrell Gave End.  Medication Adjustments/Labs and Tests Ordered: Current  medicines are reviewed at length with the patient today.  Concerns regarding medicines are outlined above.  Medication changes, Labs and Tests ordered today are outlined in the Patient Instructions noted below. Patient Instructions  Medication Instructions:  1. DECREASE ASPIRIN TO 81 MG DAILY  Labwork: NONE  Testing/Procedures: CAROTID ULTRASOUND NEEDS TO BE SCHEDULED ; ORDER IN EPIC   Follow-Up: Your physician wants you to follow-up in: [place] DR. END OR Asani Deniston, PAC You will receive a reminder letter in the mail two months in advance. If you don't receive a letter, please call our office to schedule the follow-up appointment.  Any Other Special Instructions Will Be Listed Below (If Applicable).  If you need a refill on your cardiac medications before your next appointment, please call your pharmacy.  Signed, Richardson Dopp, PA-C  04/26/2016 10:31 AM    [facility] [address], [place], [place]  [address] Phone: [phone]; Fax: [phone]

## 2016-04-26 ENCOUNTER — Encounter: Payer: Self-pay | Admitting: Physician Assistant

## 2016-04-26 ENCOUNTER — Encounter (INDEPENDENT_AMBULATORY_CARE_PROVIDER_SITE_OTHER): Payer: Self-pay

## 2016-04-26 ENCOUNTER — Ambulatory Visit (INDEPENDENT_AMBULATORY_CARE_PROVIDER_SITE_OTHER): Payer: Medicare Other | Admitting: Physician Assistant

## 2016-04-26 VITALS — BP 140/60 | HR 70 | Ht 65.0 in | Wt 256.4 lb

## 2016-04-26 DIAGNOSIS — I25119 Atherosclerotic heart disease of native coronary artery with unspecified angina pectoris: Secondary | ICD-10-CM | POA: Diagnosis not present

## 2016-04-26 DIAGNOSIS — I10 Essential (primary) hypertension: Secondary | ICD-10-CM

## 2016-04-26 DIAGNOSIS — I779 Disorder of arteries and arterioles, unspecified: Secondary | ICD-10-CM | POA: Diagnosis not present

## 2016-04-26 DIAGNOSIS — E7800 Pure hypercholesterolemia, unspecified: Secondary | ICD-10-CM | POA: Diagnosis not present

## 2016-04-26 DIAGNOSIS — I209 Angina pectoris, unspecified: Secondary | ICD-10-CM

## 2016-04-26 DIAGNOSIS — I739 Peripheral vascular disease, unspecified: Secondary | ICD-10-CM

## 2016-04-26 DIAGNOSIS — I350 Nonrheumatic aortic (valve) stenosis: Secondary | ICD-10-CM

## 2016-04-26 MED ORDER — ASPIRIN EC 81 MG PO TBEC: 81.0000 mg | DELAYED_RELEASE_TABLET | Freq: Every day | ORAL | Status: AC

## 2016-04-26 MED ORDER — LISINOPRIL 10 MG PO TABS: 10.0000 mg | ORAL_TABLET | Freq: Every day | ORAL | 3 refills | Status: DC

## 2016-04-26 NOTE — Patient Instructions (Addendum)
Medication Instructions:  1. DECREASE ASPIRIN TO 81 MG DAILY  Labwork: NONE  Testing/Procedures: CAROTID ULTRASOUND NEEDS TO BE SCHEDULED ; ORDER IN EPIC   Follow-Up: Your physician wants you to follow-up in: [place] DR. END OR SCOTT WEAVER, PAC You will receive a reminder letter in the mail two months in advance. If you don't receive a letter, please call our office to schedule the follow-up appointment.  Any Other Special Instructions Will Be Listed Below (If Applicable).  If you need a refill on your cardiac medications before your next appointment, please call your pharmacy.

## 2016-05-10 ENCOUNTER — Encounter (HOSPITAL_COMMUNITY): Payer: Self-pay

## 2016-05-13 ENCOUNTER — Other Ambulatory Visit: Payer: Self-pay | Admitting: *Deleted

## 2016-05-13 MED ORDER — METOPROLOL TARTRATE 50 MG PO TABS: 50.0000 mg | ORAL_TABLET | Freq: Two times a day (BID) | ORAL | 0 refills | Status: DC

## 2016-05-13 MED ORDER — AMLODIPINE BESYLATE 5 MG PO TABS: 5.0000 mg | ORAL_TABLET | Freq: Every day | ORAL | 3 refills | Status: DC

## 2016-05-13 MED ORDER — METOPROLOL TARTRATE 50 MG PO TABS: 50.0000 mg | ORAL_TABLET | Freq: Two times a day (BID) | ORAL | 3 refills | Status: DC

## 2016-05-13 NOTE — Telephone Encounter (Signed)
metoprolol (LOPRESSOR) 50 MG tablet  Medication  Date: 04/22/2016 Department: [address] Office Ordering/Authorizing: Larey Dresser, MD  Order Providers   Prescribing Provider Encounter Provider  Larey Dresser, MD Larey Dresser, MD  Medication Detail    Disp Refills Start End   metoprolol (LOPRESSOR) 50 MG tablet 30 tablet 0 04/22/2016    Sig: TAKE ONE TABLET BY MOUTH TWICE DAILY   Notes to Pharmacy: Please call our office to schedule an yearly appointment before anymore refills or refill with PCP. [phone]. Thank you 2nd attempt   E-Prescribing Status: Sent to pharmacy (04/22/2016 11:47 AM EDT)    metoprolol (LOPRESSOR) 50 MG tablet  Medication  Date: 04/22/2016 Department: [facility] Office Ordering/Authorizing: Larey Dresser, MD  Order Providers   Prescribing Provider Encounter Provider  Larey Dresser, MD Larey Dresser, MD  Medication Detail    Disp Refills Start End   metoprolol (LOPRESSOR) 50 MG tablet 30 tablet 0 04/22/2016    Sig: TAKE ONE TABLET BY MOUTH TWICE DAILY   Notes to Pharmacy: Please call our office to schedule an yearly appointment before anymore refills or refill with PCP. [phone]. Thank you 2nd attempt   E-Prescribing Status: Sent to pharmacy (04/22/2016 11:47 AM EDT)   Pharmacy   [facility] ([facility] [place] [address] [place]

## 2016-05-20 ENCOUNTER — Other Ambulatory Visit: Payer: Self-pay

## 2016-05-20 MED ORDER — ATORVASTATIN CALCIUM 80 MG PO TABS: 80.0000 mg | ORAL_TABLET | Freq: Every day | ORAL | 5 refills | Status: DC

## 2016-06-17 ENCOUNTER — Ambulatory Visit (HOSPITAL_COMMUNITY)
Admission: RE | Admit: 2016-06-17 | Discharge: 2016-06-17 | Disposition: A | Discharge status: Home / Self Care | Payer: Medicare Other | Source: Ambulatory Visit | Attending: Physician Assistant | Admitting: Physician Assistant

## 2016-06-17 DIAGNOSIS — I6523 Occlusion and stenosis of bilateral carotid arteries: Secondary | ICD-10-CM | POA: Diagnosis not present

## 2016-06-18 ENCOUNTER — Encounter: Payer: Self-pay | Admitting: Physician Assistant

## 2016-06-18 ENCOUNTER — Telehealth: Payer: Self-pay | Admitting: *Deleted

## 2016-06-18 NOTE — Telephone Encounter (Signed)
Pt notified of carotid US results and findings by phone with verbal understanding. I will fax a copy of results to PCP as well.

## 2016-07-15 ENCOUNTER — Other Ambulatory Visit: Payer: Self-pay | Admitting: Oncology

## 2016-10-28 ENCOUNTER — Other Ambulatory Visit: Payer: Self-pay | Admitting: Physician Assistant

## 2016-10-28 MED ORDER — ATORVASTATIN CALCIUM 80 MG PO TABS: 80.0000 mg | ORAL_TABLET | Freq: Every day | ORAL | 5 refills | Status: DC

## 2017-01-29 ENCOUNTER — Other Ambulatory Visit: Payer: Self-pay | Admitting: *Deleted

## 2017-01-29 MED ORDER — ATORVASTATIN CALCIUM 80 MG PO TABS: 80.0000 mg | ORAL_TABLET | Freq: Every day | ORAL | 0 refills | Status: DC

## 2017-01-30 ENCOUNTER — Other Ambulatory Visit: Payer: Self-pay | Admitting: Oncology

## 2017-02-11 ENCOUNTER — Ambulatory Visit (HOSPITAL_COMMUNITY)
Admission: RE | Admit: 2017-02-11 | Discharge: 2017-02-11 | Disposition: A | Discharge status: Home / Self Care | Payer: Medicare Other | Source: Ambulatory Visit | Attending: Oncology | Admitting: Oncology

## 2017-02-11 ENCOUNTER — Other Ambulatory Visit: Payer: Self-pay | Admitting: *Deleted

## 2017-02-11 ENCOUNTER — Other Ambulatory Visit (HOSPITAL_BASED_OUTPATIENT_CLINIC_OR_DEPARTMENT_OTHER): Payer: Medicare Other

## 2017-02-11 DIAGNOSIS — C50812 Malignant neoplasm of overlapping sites of left female breast: Secondary | ICD-10-CM

## 2017-02-11 DIAGNOSIS — R918 Other nonspecific abnormal finding of lung field: Secondary | ICD-10-CM | POA: Insufficient documentation

## 2017-02-11 DIAGNOSIS — I700 Atherosclerosis of aorta: Secondary | ICD-10-CM | POA: Insufficient documentation

## 2017-02-11 LAB — COMPREHENSIVE METABOLIC PANEL
ALBUMIN: 3.7 g/dL (ref 3.5–5.0)
ALK PHOS: 68 U/L (ref 40–150)
ALT: 20 U/L (ref 0–55)
AST: 17 U/L (ref 5–34)
Anion Gap: 8 mEq/L (ref 3–11)
BUN: 18.4 mg/dL (ref 7.0–26.0)
CO2: 26 meq/L (ref 22–29)
Calcium: 9.8 mg/dL (ref 8.4–10.4)
Chloride: 106 mEq/L (ref 98–109)
Creatinine: 1 mg/dL (ref 0.6–1.1)
EGFR: 55 mL/min/{1.73_m2} — AB (ref >90)
GLUCOSE: 86 mg/dL (ref 70–140)
POTASSIUM: 4.5 meq/L (ref 3.5–5.1)
SODIUM: 141 meq/L (ref 136–145)
Total Bilirubin: 0.46 mg/dL (ref 0.20–1.20)
Total Protein: 7.1 g/dL (ref 6.4–8.3)

## 2017-02-11 LAB — CBC WITH DIFFERENTIAL/PLATELET
BASO%: 0.6 % (ref 0.0–2.0)
BASOS ABS: 0 10*3/uL (ref 0.0–0.1)
EOS%: 1.9 % (ref 0.0–7.0)
Eosinophils Absolute: 0.1 10*3/uL (ref 0.0–0.5)
HCT: 39.3 % (ref 34.8–46.6)
HEMOGLOBIN: 12.8 g/dL (ref 11.6–15.9)
LYMPH%: 24.1 % (ref 14.0–49.7)
MCH: 28.9 pg (ref 25.1–34.0)
MCHC: 32.5 g/dL (ref 31.5–36.0)
MCV: 88.8 fL (ref 79.5–101.0)
MONO#: 0.5 10*3/uL (ref 0.1–0.9)
MONO%: 7.1 % (ref 0.0–14.0)
NEUT%: 66.3 % (ref 38.4–76.8)
NEUTROS ABS: 4.7 10*3/uL (ref 1.5–6.5)
Platelets: 197 10*3/uL (ref 145–400)
RBC: 4.43 10*6/uL (ref 3.70–5.45)
RDW: 13 % (ref 11.2–14.5)
WBC: 7.1 10*3/uL (ref 3.9–10.3)
lymph#: 1.7 10*3/uL (ref 0.9–3.3)

## 2017-02-18 ENCOUNTER — Ambulatory Visit (HOSPITAL_BASED_OUTPATIENT_CLINIC_OR_DEPARTMENT_OTHER): Payer: Medicare Other | Admitting: Oncology

## 2017-02-18 VITALS — BP 131/54 | HR 58 | Temp 98.0°F | Resp 18 | Ht 65.0 in | Wt 251.0 lb

## 2017-02-18 DIAGNOSIS — Z853 Personal history of malignant neoplasm of breast: Secondary | ICD-10-CM | POA: Diagnosis not present

## 2017-02-18 DIAGNOSIS — Z170 Estrogen receptor positive status [ER+]: Principal | ICD-10-CM

## 2017-02-18 DIAGNOSIS — M8580 Other specified disorders of bone density and structure, unspecified site: Secondary | ICD-10-CM

## 2017-02-18 DIAGNOSIS — R918 Other nonspecific abnormal finding of lung field: Secondary | ICD-10-CM

## 2017-02-18 DIAGNOSIS — C50812 Malignant neoplasm of overlapping sites of left female breast: Secondary | ICD-10-CM

## 2017-02-18 NOTE — Progress Notes (Signed)
ID: Jamie [name]   DOB: 18-May-1946  MR#: [id number]  CSN#:[id number]   PCP: Verdell Carmine., MD GYN: SURG:  Autumn Messing, MD;  Theodoro Kos, DO OTHER:  Loralie Champagne, MD;  Lance Morin, MD, Baltazar Apo MD  CHIEF COMPLAINT:  Estrogen receptor positive breast cancer  CURRENT TREATMENT: Completing 5 years of Anastrozole  HISTORY OF PRESENT ILLNESS: From the original intake note:  The patient noted a mass in her left breast and brought it to the attention of her primary physician, Dr. Ward Givens, who set her up for diagnostic mammography. This showed a mass of about 3 cm in diameter (I do not have a copy of that study or comparison with prior mammography from July of 2012). The patient was referred to Dr. Marlou Starks and he set her up for biopsy of the mass 08/28/2011 [facility]. The pathology showed [id number]) an invasive ductal carcinoma, grade 2, estrogen receptor 100% positive, progesterone receptor 96% positive, with an MIB-1 of 30%, and HER-2 equivocal with the ratio by CISH of 1.81.  Diagnostic right mammography was then performed 08/28/2011. Multiple masses were noted, and there were multiple soft tissue nodules by palpation. Ultrasound specifically showed 2 hypoechoic round masses measuring 1.3 and 0.9 cm respectively. 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 With this information and after extensive discussion the patient opted for bilateral mastectomies with left sentinel lymph node sampling, and this was performed 10/18/2011.  Her subsequent history is as detailed below  INTERVAL HISTORY: Jamie [name] returns today for follow-up and treatment of her estrogen receptor positive breast cancer. She continues on anastrozole, with good tolerance. Hot flashes and vaginal dryness are not a major issue. She never developed the arthralgias or myalgias that many patients can experience on this medication. She obtains it at a good  price.   REVIEW OF SYSTEMS: Jamie [name] continues to struggle with her lower extremity swelling and history of cellulitis, but that seems to be controlled at present. She tells me she has had several bladder infections since her last visit here. She is followed by urology in [facility]. The antibiotics "teardrop her stomach", and she is just recovering from a recent course. Aside from these issues a detailed review of systems today was stable.  PAST MEDICAL HISTORY: Past Medical History:  Diagnosis Date  . Anxiety   . Asthma   . Breast cancer ([place]) 08/2011   lt. breast ca  . CAD (coronary artery disease)    NSTEMI 5/13:  LHC demonstrated oLM 75%, pLAD 80%, RCA occluded, filled by collaterals from the LAD.  Echo 12/10/11: EF 60%.; CABG 12/13/11: LIMA-LAD, SVG-RCA, SVG-diagonal and OM.   Marland Kitchen Carotid artery disease ([place])    a.  Pre-CABG Dopplers 5/13: Bilateral 40-59%; b.  Carotid US 5/36: RICA 46-80%, LICA 32-12%, normal subclavians bilaterally >> FU 6 months;  c. Carotid US 24/82: RICA 50-03%; LICA 70-48%, normal subclavians >> FU 1 year // d. Carotid US 11/17:  Stable 40-59% bilateral ICA stenosis; F/U 1 year.  Marland Kitchen COPD (chronic obstructive pulmonary disease) ([place])   . DM2 (diabetes mellitus, type 2) ([facility])    metformin  . HLD (hyperlipidemia)   . Hx of cardiovascular stress test    Myoview 5/16:  small area of inf-lat ischemia, EF 70%; Low Risk >> try medical Rx  . Hx of echocardiogram    Echo 5/16:  EF 60-65%, no RWMA, Gr 1 DD, Ao sclerosis, mild AS, mean 14 mmHg,  MAC  . Hx of migraines   . Hypertension   . Myocardial infarction   . Obesity   . Shortness of breath   . Sleep apnea    sleep study2-3 yrs ago lost weight afterward but gained back  . UTI (lower urinary tract infection)    freq-bladder implant -removed  hx tobacco abuse, 80 P/Y, resolved  PAST SURGICAL HISTORY: Past Surgical History:  Procedure Laterality Date  . ABDOMINAL HYSTERECTOMY  1977  . APPENDECTOMY    . AXILLARY LYMPH  NODE DISSECTION  10/18/2011   Procedure: AXILLARY LYMPH NODE DISSECTION;  Surgeon: Merrie Roof, MD;  Location: [facility];  Service: General;  Laterality: Left;  . BREAST RECONSTRUCTION  10/18/2011   Procedure: BREAST RECONSTRUCTION;  Surgeon: Theodoro Kos, DO;  Location: [place];  Service: Plastics;  Laterality: Bilateral;   bilateral breast reconstruction with bilateral tissue expander and placement of flex hd.  . CESAREAN SECTION  04/1976  . CORONARY ARTERY BYPASS GRAFT  12/13/2011   Procedure: CORONARY ARTERY BYPASS GRAFTING (CABG);  Surgeon: Gaye Pollack, MD;  Location: [place];  Service: Open Heart Surgery;  Laterality: N/A;  Times four using endoscopically harvested left greater saphenous vein and left internal mammary artery. Right greater saphenous vein attempted; not appropriate for vein harvest.  . LAPAROSCOPIC GASTRIC BANDING  2010  . LEFT HEART CATHETERIZATION WITH CORONARY ANGIOGRAM N/A 12/11/2011   Procedure: LEFT HEART CATHETERIZATION WITH CORONARY ANGIOGRAM;  Surgeon: Larey Dresser, MD;  Location: [facility] CATH LAB;  Service: Cardiovascular;  Laterality: N/A;  . LESION REMOVAL  04/09/2012   Procedure: LESION REMOVAL;  Surgeon: Theodoro Kos, DO;  Location: [place];  Service: Plastics;  Laterality: Bilateral;  . MASTECTOMY W/ SENTINEL NODE BIOPSY  10/18/2011   Procedure: MASTECTOMY WITH SENTINEL LYMPH NODE BIOPSY;  Surgeon: Merrie Roof, MD;  Location: [place];  Service: General;  Laterality: Bilateral;  bilateral mastectomy and left sentinel node biopsy  . NASAL SINUS SURGERY    . PORT-A-CATH REMOVAL  04/09/2012   Procedure: REMOVAL PORT-A-CATH;  Surgeon: Merrie Roof, MD;  Location: [place];  Service: General;  Laterality: Right;  . PORTACATH PLACEMENT  12/02/2011   Procedure: INSERTION PORT-A-CATH;  Surgeon: Merrie Roof, MD;  Location: [facility];  Service: General;  Laterality: Right;  . TONSILLECTOMY    . US ECHOCARDIOGRAPHY     at [place] Left 11/26/2013   Procedure: VIDEO BRONCHOSCOPY WITH ENDOBRONCHIAL NAVIGATION;  Surgeon: Collene Gobble, MD;  Location: [facility];  Service: Thoracic;  Laterality: Left;  no salpingo-oophorectomy  FAMILY HISTORY Family History  Problem Relation Age of Onset  . Heart disease Mother   . Heart disease Father   . Breast cancer Maternal Aunt   . Ovarian cancer Paternal Aunt   . Breast cancer Maternal Aunt   . Anesthesia problems Neg Hx   . Hypotension Neg Hx   . Malignant hyperthermia Neg Hx   . Pseudochol deficiency Neg Hx   The patient's father died at the age of 106. He had a history of asbestos exposure. The patient's mother died at the age of 74 from heart disease. The patient had one brother, no sisters. There is no breast or ovarian cancer in the immediate family but 2 of the patient's 5 maternal aunts had breast cancer (she does not know the age of diagnosis), and one of her father's sisters had ovarian  cancer.  GYNECOLOGIC HISTORY: She had menarche age 15, no firm date for her menopause as she had hysterectomy remotely. She is GX P2, with a pair of twins and a daughter as her biological children as noted below. She never took hormone replacement.  SOCIAL HISTORY:  (Updated January 2015) She used to own a tuxedo shop. Her husband, Shea Evans, is retired but used to own a Higher education careers adviser. He is currently working in [place] with one of his own sons. At home in addition to the patient and her husband is one of the patient's daughters and her 3 children (Josh 66, Shearon Balo, and Harmon Pier 2), as well as one of Forrest sons. Altogether they have 6 children and 15 grandchildren and 1 great granddaughter. The patient attends [employer]    ADVANCED DIRECTIVES: Not in place. The patient has received advanced directives/healthcare power of attorney papers to be completed on notarized at her discretion  HEALTH MAINTENANCE:  (Updated January 2015) Social  History  Substance Use Topics  . Smoking status: Former Smoker    Packs/day: 2.00    Years: 40.00    Types: Cigarettes    Quit date: 08/25/2006  . Smokeless tobacco: Never Used  . Alcohol use Yes     Comment: 1-2 times a year     Colonoscopy: 2012  PAP: s/p hysterectomy  Bone density:  06/05/2012, Normal  Lipid panel:  August 2013, Dr. Harrell Lark   Allergies  Allergen Reactions  . Hydrocodone-Acetaminophen     Other reaction(s): Other (See Comments) syncope  . Vicodin [Hydrocodone-Acetaminophen] Other (See Comments)    syncope  . Sulfa Antibiotics Other (See Comments)    Unknown; childhood allergy  . Escitalopram Diarrhea and Nausea And Vomiting  . Sulfamethoxazole Rash    Current Outpatient Prescriptions  Medication Sig Dispense Refill  . amLODipine (NORVASC) 5 MG tablet Take 1 tablet (5 mg total) by mouth daily. 90 tablet 3  . aspirin EC 81 MG tablet Take 1 tablet (81 mg total) by mouth daily.    Marland Kitchen atorvastatin (LIPITOR) 80 MG tablet Take 1 tablet (80 mg total) by mouth daily at 6 PM. 90 tablet 0  . buPROPion (WELLBUTRIN XL) 300 MG 24 hr tablet Take 300 mg by mouth daily.     . fluticasone (FLONASE) 50 MCG/ACT nasal spray Place 2 sprays into both nostrils daily.     Marland Kitchen lisinopril (PRINIVIL,ZESTRIL) 10 MG tablet Take 1 tablet (10 mg total) by mouth daily. 90 tablet 3  . loratadine (CLARITIN) 10 MG tablet Take 10 mg by mouth daily.    . metFORMIN (GLUCOPHAGE) 500 MG tablet Take 1 tablet by mouth 2 (two) times daily.    . metoprolol (LOPRESSOR) 50 MG tablet Take 1 tablet (50 mg total) by mouth 2 (two) times daily. 180 tablet 3  . mirabegron ER (MYRBETRIQ) 50 MG TB24 tablet Take 50 mg by mouth.    . Multiple Vitamin (MULTI VITAMIN DAILY PO) Take 1 tablet by mouth daily.    . nitrofurantoin, macrocrystal-monohydrate, (MACROBID) 100 MG capsule Take 1 capsule by mouth daily.    . nitroGLYCERIN (NITROSTAT) 0.4 MG SL tablet Place 1 tablet (0.4 mg total) under the tongue every 5 (five)  minutes as needed. 25 tablet 3   No current facility-administered medications for this visit.     OBJECTIVE: Middle-aged white woman who appears stated age  26:   02/18/17 1200  BP: (!) 131/54  Pulse: (!) 58  Resp: 18  Temp: 98 F (36.7 C)  Body mass index is 41.77 kg/m.    ECOG FS: 1  Filed Weights   02/18/17 1200  Weight: 251 lb (113.9 kg)   (weight was 266 pounds in August of 2014)  Sclerae unicteric, pupils round and equal Oropharynx clear and moist No cervical or supraclavicular adenopathy Lungs no rales or rhonchi Heart regular rate and rhythm Abd soft, obese, nontender, positive bowel sounds MSK no focal spinal tenderness, no upper extremity lymphedema Neuro: nonfocal, well oriented, appropriate affect Breasts: Status post bilateral mastectomies. There is no evidence of chest wall recurrence. Both axillae are benign.  LAB RESULTS:   Lab Results  Component Value Date   WBC 7.1 02/11/2017   NEUTROABS 4.7 02/11/2017   HGB 12.8 02/11/2017   HCT 39.3 02/11/2017   MCV 88.8 02/11/2017   PLT 197 02/11/2017      Chemistry      Component Value Date/Time   NA 141 02/11/2017 1234   K 4.5 02/11/2017 1234   CL 107 10/29/2014 0023   CO2 26 02/11/2017 1234   BUN 18.4 02/11/2017 1234   CREATININE 1.0 02/11/2017 1234      Component Value Date/Time   CALCIUM 9.8 02/11/2017 1234   ALKPHOS 68 02/11/2017 1234   AST 17 02/11/2017 1234   ALT 20 02/11/2017 1234   BILITOT 0.46 02/11/2017 1234       STUDIES: Dg Chest 2 View  Result Date: 02/11/2017 CLINICAL DATA:  Worsening cough for 2 days. Nodules seen on CT. Previous smoker. History of hypertension, diabetes, left breast cancer status post bilateral mastectomy EXAM: CHEST  2 VIEW COMPARISON:  Chest x-ray dated 04/07/2016 and chest CT dated 05/14/2016. FINDINGS: Heart size is upper normal, stable. Atherosclerotic changes again noted at the aortic arch. Median sternotomy wires appear intact and stable in  alignment, for presumed CABG. Lungs appear clear. No pulmonary nodule or mass seen. No pleural effusion or pneumothorax. No acute or suspicious osseous finding.  Lap band in place. IMPRESSION: 1. No active cardiopulmonary disease. No evidence of pneumonia or pulmonary edema. 2. No pulmonary nodule or mass identified. Report from chest CT of 05/14/2016 recommended a follow-up low-dose chest CT be performed in October of 2018 for the small pulmonary nodules. 3. Aortic atherosclerosis. Electronically Signed   By: Franki Cabot M.D.   On: 02/11/2017 14:31    CT LUNG SCREEN WO CONTRAST10/05/2016 [facility] Result Narrative  CLINICAL DATA:72 year old female former smoker (quit 11 years ago) with with 40 pack-year history of smoking. Lung cancer screening examination. Additional history of breast cancer status post double mastectomy and chemotherapy.  EXAM: CT CHEST WITHOUT CONTRAST LOW-DOSE FOR LUNG CANCER SCREENING  TECHNIQUE: Multidetector CT imaging of the chest was performed following the standard protocol without IV contrast.  COMPARISON:Chest CT 02/13/2015.  FINDINGS: Cardiovascular: Heart size is normal. There is no significant pericardial fluid, thickening or pericardial calcification. There is aortic atherosclerosis, as well as atherosclerosis of the great vessels of the mediastinum and the coronary arteries, including calcified atherosclerotic plaque in the left main, left anterior descending, left circumflex and right coronary arteries. Status post median sternotomy for CABG, including LIMA to the LAD. Calcifications of the aortic valve and mitral annulus.  Mediastinum/Nodes: No pathologically enlarged mediastinal or hilar lymph nodes. Please note that accurate exclusion of hilar adenopathy is limited on noncontrast CT scans. Esophagus is unremarkable in appearance. No axillary lymphadenopathy. Surgical clips in the left axilla, presumably from prior  lymph node dissection.  Lungs/Pleura: Small pulmonary nodules are  noted in the right lung, measuring up to a volume derived mean diameter of only 2.5 mm in the posterior aspect of the right upper lobe where there is a subpleural nodule (image 94 of series 3). No other larger more suspicious appearing pulmonary nodules or masses are noted. Area of linear scarring at site of remote left upper lobe nodule is similar to prior study 02/13/2015. No acute consolidative airspace disease. No pleural effusions. Mild linear scarring in the left lower lobe abutting the major fissure. Mild linear scarring in the inferior segment of the lingula.  Upper Abdomen: Decreased attenuation throughout the hepatic parenchyma, compatible with hepatic steatosis. LapBand in place immediately beneath the gastroesophageal junction. Aortic atherosclerosis.  Musculoskeletal: Median sternotomy wires. There are no aggressive appearing lytic or blastic lesions noted in the visualized portions of the skeleton.  IMPRESSION: 1. Lung-RADS Category 2, benign appearance or behavior. Continue annual screening with low-dose chest CT without contrast in 12 months. 2. Aortic atherosclerosis, in addition to left main and 3 vessel coronary artery disease. Status post median sternotomy for CABG, including LIMA to the LAD. 3. There are calcifications of the aortic valve and mitral annulus. Echocardiographic correlation for evaluation of potential valvular dysfunction may be warranted if clinically indicated. 4. Hepatic steatosis.   Electronically Signed By: DanielEntrikin M.D. On: 05/14/2016 12:57   ASSESSMENT: 71 y.o. [name] woman   (1)  s/p bilateral mastectomies and left axillary lymph node dissection 10/18/2011 for a left-sided Invasive ductal carcinoma involving overlapping sites,  pT2 pN1, stage IIB, grade 2, estrogen receptor 100% and progesterone receptor 96% positive, with no HER-2 amplification (1.81,  repeat 1.35), Mib-1 of 30%.   (a) The right breast showed no malignancy.  (2) Plan was to treat in the adjuvant setting with 4 dose dense cycles of doxorubicin/cyclophosphamide followed by 4 dose dense cycles of paclitaxel, with Doxorubicin given by continuous infusion over 72 hours. Patient received a truncated cycle 1, the continuous infusion disconnected after proximally 6 hours when the patient was admitted to the hospital with unstable angina.  (3)  During hospitalization, CT angio negative for PE, echo showing well-preserved EF, enzymes/ECG showing a NSTEMI, s/p cath May 7 showing left main, LAD and RCA disease with a well-preserved ejection fraction, revascularization performed 12/13/2011 with an uneventful postoperative course  (4) the patient opted against further chemotherapy and also against post-mastectomy irradiation; she started anastrozole May 2013, completing 5 years June 2018  (a) bone density February 2016 showed osteopenia  (5) enlarging LUL mass noted on chest CT/ PET March 2015,   (a) biopsy 11/26/2013 showed no evidence of atypia or malignancy, but felt to be possibly discordant.  (b) chest CT 05/10/2014 and 08/17/2014 show decreased prominence (post infectious scarring?)  (c) chest CT 02/13/2015 showed continued decrease in size    PLAN:  Jamie [name] is now a little more than 5 years out from definitive surgery for her breast cancer with no evidence of disease recurrence. This is very favorable.  She completed 5 years of anastrozole a few months ago. We discussed the fact that in some cases continuing anastrozole and additional 2 years may decrease the risk of recurrence outside the breast up to 1%. This is not a motivator for her and she is comfortable knowing off the medication at this point.  The one thing that she would like before being completely released is to have a repeat CT scan of the chest. I have put that in for October and made sure that a copy ago*primary  care  physician as well as to myself. Assuming there has been no change I do not think any further yearly CTs would be warranted in the absence of specific symptoms to evaluate.  Otherwise I feel comfortable releasing her from follow-up here. All Jamie [name] will need in terms of breast cancer follow-up is a yearly physician breast exam  Of course I will be glad to see her at any point in the future if and when the need arises, but as of now were making no further routine appointment for her here.   Chauncey Cruel, MD     02/19/2017

## 2017-03-24 ENCOUNTER — Encounter (HOSPITAL_COMMUNITY): Payer: Self-pay

## 2017-04-21 ENCOUNTER — Other Ambulatory Visit: Payer: Self-pay | Admitting: *Deleted

## 2017-04-23 ENCOUNTER — Telehealth: Payer: Self-pay | Admitting: Oncology

## 2017-04-23 NOTE — Telephone Encounter (Signed)
Spoke with patient regarding her appts on 10/16 that were added per 9/17 sch msg

## 2017-04-26 ENCOUNTER — Other Ambulatory Visit: Payer: Self-pay | Admitting: Physician Assistant

## 2017-04-26 DIAGNOSIS — I25119 Atherosclerotic heart disease of native coronary artery with unspecified angina pectoris: Secondary | ICD-10-CM

## 2017-04-26 DIAGNOSIS — I10 Essential (primary) hypertension: Secondary | ICD-10-CM

## 2017-05-20 ENCOUNTER — Ambulatory Visit (HOSPITAL_COMMUNITY)
Admission: RE | Admit: 2017-05-20 | Discharge: 2017-05-20 | Disposition: A | Discharge status: Home / Self Care | Payer: Medicare Other | Source: Ambulatory Visit | Attending: Oncology | Admitting: Oncology

## 2017-05-20 ENCOUNTER — Other Ambulatory Visit (HOSPITAL_BASED_OUTPATIENT_CLINIC_OR_DEPARTMENT_OTHER): Payer: Medicare Other

## 2017-05-20 DIAGNOSIS — K8020 Calculus of gallbladder without cholecystitis without obstruction: Secondary | ICD-10-CM | POA: Diagnosis not present

## 2017-05-20 DIAGNOSIS — C50812 Malignant neoplasm of overlapping sites of left female breast: Secondary | ICD-10-CM

## 2017-05-20 DIAGNOSIS — I700 Atherosclerosis of aorta: Secondary | ICD-10-CM | POA: Insufficient documentation

## 2017-05-20 DIAGNOSIS — Z170 Estrogen receptor positive status [ER+]: Secondary | ICD-10-CM | POA: Diagnosis present

## 2017-05-20 DIAGNOSIS — K760 Fatty (change of) liver, not elsewhere classified: Secondary | ICD-10-CM | POA: Diagnosis not present

## 2017-05-20 LAB — CBC WITH DIFFERENTIAL/PLATELET
BASO%: 0.5 % (ref 0.0–2.0)
Basophils Absolute: 0 10*3/uL (ref 0.0–0.1)
EOS%: 2 % (ref 0.0–7.0)
Eosinophils Absolute: 0.1 10*3/uL (ref 0.0–0.5)
HCT: 39.2 % (ref 34.8–46.6)
HGB: 12.5 g/dL (ref 11.6–15.9)
LYMPH%: 30.6 % (ref 14.0–49.7)
MCH: 28.6 pg (ref 25.1–34.0)
MCHC: 31.9 g/dL (ref 31.5–36.0)
MCV: 89.7 fL (ref 79.5–101.0)
MONO#: 0.5 10*3/uL (ref 0.1–0.9)
MONO%: 8.1 % (ref 0.0–14.0)
NEUT%: 58.8 % (ref 38.4–76.8)
NEUTROS ABS: 3.6 10*3/uL (ref 1.5–6.5)
Platelets: 181 10*3/uL (ref 145–400)
RBC: 4.37 10*6/uL (ref 3.70–5.45)
RDW: 13.6 % (ref 11.2–14.5)
WBC: 6 10*3/uL (ref 3.9–10.3)
lymph#: 1.9 10*3/uL (ref 0.9–3.3)

## 2017-05-20 LAB — COMPREHENSIVE METABOLIC PANEL
ALT: 28 U/L (ref 0–55)
AST: 25 U/L (ref 5–34)
Albumin: 3.6 g/dL (ref 3.5–5.0)
Alkaline Phosphatase: 72 U/L (ref 40–150)
Anion Gap: 9 mEq/L (ref 3–11)
BILIRUBIN TOTAL: 0.48 mg/dL (ref 0.20–1.20)
BUN: 14.4 mg/dL (ref 7.0–26.0)
CO2: 24 meq/L (ref 22–29)
Calcium: 9.2 mg/dL (ref 8.4–10.4)
Chloride: 107 mEq/L (ref 98–109)
Creatinine: 1 mg/dL (ref 0.6–1.1)
EGFR: 55 mL/min/{1.73_m2} — AB (ref >60)
GLUCOSE: 99 mg/dL (ref 70–140)
Potassium: 3.9 mEq/L (ref 3.5–5.1)
SODIUM: 140 meq/L (ref 136–145)
TOTAL PROTEIN: 7.1 g/dL (ref 6.4–8.3)

## 2017-05-20 MED ORDER — IOPAMIDOL (ISOVUE-300) INJECTION 61%
75.0000 mL | Freq: Once | INTRAVENOUS | Status: AC | PRN
  Administered 2017-05-20: 75 mL via INTRAVENOUS

## 2017-05-20 MED ORDER — IOPAMIDOL (ISOVUE-300) INJECTION 61%
INTRAVENOUS | Status: AC
  Filled 2017-05-20: qty 75

## 2017-05-21 ENCOUNTER — Other Ambulatory Visit: Payer: Self-pay | Admitting: Oncology

## 2017-05-21 NOTE — Progress Notes (Unsigned)
ID: Jamie [name]   DOB: July 22, 1946  MR#: [id number]  CSN#:[id number]   PCP: Verdell Carmine., MD GYN: SURG:  Autumn Messing, MD;  Theodoro Kos, DO OTHER:  Loralie Champagne, MD;  Lance Morin, MD, Baltazar Apo MD  CHIEF COMPLAINT:  Estrogen receptor positive breast cancer  CURRENT TREATMENT: Completing 5 years of Anastrozole  HISTORY OF PRESENT ILLNESS: From the original intake note:  The patient noted a mass in her left breast and brought it to the attention of her primary physician, Dr. Ward Givens, who set her up for diagnostic mammography. This showed a mass of about 3 cm in diameter (I do not have a copy of that study or comparison with prior mammography from July of 2012). The patient was referred to Dr. Marlou Starks and he set her up for biopsy of the mass 08/28/2011 at the [facility]. The pathology showed [id number]) an invasive ductal carcinoma, grade 2, estrogen receptor 100% positive, progesterone receptor 96% positive, with an MIB-1 of 30%, and HER-2 equivocal with the ratio by CISH of 1.81.  Diagnostic right mammography was then performed 08/28/2011. Multiple masses were noted, and there were multiple soft tissue nodules by palpation. Ultrasound specifically showed 2 hypoechoic round masses measuring 1.3 and 0.9 cm respectively. 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 With this information and after extensive discussion the patient opted for bilateral mastectomies with left sentinel lymph node sampling, and this was performed 10/18/2011.  Her subsequent history is as detailed below  INTERVAL HISTORY: Jamie [name] returns today for follow-up and treatment of her estrogen receptor positive breast cancer. She continues on anastrozole, with good tolerance. Hot flashes and vaginal dryness are not a major issue. She never developed the arthralgias or myalgias that many patients can experience on this medication. She obtains it at a good  price.   REVIEW OF SYSTEMS: Jamie [name] continues to struggle with her lower extremity swelling and history of cellulitis, but that seems to be controlled at present. She tells me she has had several bladder infections since her last visit here. She is followed by urology in [facility]. The antibiotics "teardrop her stomach", and she is just recovering from a recent course. Aside from these issues a detailed review of systems today was stable.  PAST MEDICAL HISTORY: Past Medical History:  Diagnosis Date  . Anxiety   . Asthma   . Breast cancer ([place]) 08/2011   lt. breast ca  . CAD (coronary artery disease)    NSTEMI 5/13:  LHC demonstrated oLM 75%, pLAD 80%, RCA occluded, filled by collaterals from the LAD.  Echo 12/10/11: EF 60%.; CABG 12/13/11: LIMA-LAD, SVG-RCA, SVG-diagonal and OM.   Marland Kitchen Carotid artery disease ([place])    a.  Pre-CABG Dopplers 5/13: Bilateral 40-59%; b.  Carotid US 6/29: RICA 52-84%, LICA 13-24%, normal subclavians bilaterally >> FU 6 months;  c. Carotid US 40/10: RICA 27-25%; LICA 36-64%, normal subclavians >> FU 1 year // d. Carotid US 11/17:  Stable 40-59% bilateral ICA stenosis; F/U 1 year.  Marland Kitchen COPD (chronic obstructive pulmonary disease) ([place])   . DM2 (diabetes mellitus, type 2) ([facility])    metformin  . HLD (hyperlipidemia)   . Hx of cardiovascular stress test    Myoview 5/16:  small area of inf-lat ischemia, EF 70%; Low Risk >> try medical Rx  . Hx of echocardiogram    Echo 5/16:  EF 60-65%, no RWMA, Gr 1 DD, Ao sclerosis, mild AS, mean 14 mmHg,  MAC  . Hx of migraines   . Hypertension   . Myocardial infarction   . Obesity   . Shortness of breath   . Sleep apnea    sleep study2-3 yrs ago lost weight afterward but gained back  . UTI (lower urinary tract infection)    freq-bladder implant -removed  hx tobacco abuse, 80 P/Y, resolved  PAST SURGICAL HISTORY: Past Surgical History:  Procedure Laterality Date  . ABDOMINAL HYSTERECTOMY  1977  . APPENDECTOMY    . AXILLARY LYMPH  NODE DISSECTION  10/18/2011   Procedure: AXILLARY LYMPH NODE DISSECTION;  Surgeon: Merrie Roof, MD;  Location: [facility];  Service: General;  Laterality: Left;  . BREAST RECONSTRUCTION  10/18/2011   Procedure: BREAST RECONSTRUCTION;  Surgeon: Theodoro Kos, DO;  Location: [facility];  Service: Plastics;  Laterality: Bilateral;   bilateral breast reconstruction with bilateral tissue expander and placement of flex hd.  . CESAREAN SECTION  04/1976  . CORONARY ARTERY BYPASS GRAFT  12/13/2011   Procedure: CORONARY ARTERY BYPASS GRAFTING (CABG);  Surgeon: Gaye Pollack, MD;  Location: [facility];  Service: Open Heart Surgery;  Laterality: N/A;  Times four using endoscopically harvested left greater saphenous vein and left internal mammary artery. Right greater saphenous vein attempted; not appropriate for vein harvest.  . LAPAROSCOPIC GASTRIC BANDING  2010  . LEFT HEART CATHETERIZATION WITH CORONARY ANGIOGRAM N/A 12/11/2011   Procedure: LEFT HEART CATHETERIZATION WITH CORONARY ANGIOGRAM;  Surgeon: Larey Dresser, MD;  Location: [facility] CATH LAB;  Service: Cardiovascular;  Laterality: N/A;  . LESION REMOVAL  04/09/2012   Procedure: LESION REMOVAL;  Surgeon: Theodoro Kos, DO;  Location: [place];  Service: Plastics;  Laterality: Bilateral;  . MASTECTOMY W/ SENTINEL NODE BIOPSY  10/18/2011   Procedure: MASTECTOMY WITH SENTINEL LYMPH NODE BIOPSY;  Surgeon: Merrie Roof, MD;  Location: [facility];  Service: General;  Laterality: Bilateral;  bilateral mastectomy and left sentinel node biopsy  . NASAL SINUS SURGERY    . PORT-A-CATH REMOVAL  04/09/2012   Procedure: REMOVAL PORT-A-CATH;  Surgeon: Merrie Roof, MD;  Location: [facility];  Service: General;  Laterality: Right;  . PORTACATH PLACEMENT  12/02/2011   Procedure: INSERTION PORT-A-CATH;  Surgeon: Merrie Roof, MD;  Location: [facility];  Service: General;  Laterality: Right;  . TONSILLECTOMY    . US ECHOCARDIOGRAPHY     at [place] Left 11/26/2013   Procedure: VIDEO BRONCHOSCOPY WITH ENDOBRONCHIAL NAVIGATION;  Surgeon: Collene Gobble, MD;  Location: [facility];  Service: Thoracic;  Laterality: Left;  no salpingo-oophorectomy  FAMILY HISTORY Family History  Problem Relation Age of Onset  . Heart disease Mother   . Heart disease Father   . Breast cancer Maternal Aunt   . Ovarian cancer Paternal Aunt   . Breast cancer Maternal Aunt   . Anesthesia problems Neg Hx   . Hypotension Neg Hx   . Malignant hyperthermia Neg Hx   . Pseudochol deficiency Neg Hx   The patient's father died at the age of 75. He had a history of asbestos exposure. The patient's mother died at the age of 25 from heart disease. The patient had one brother, no sisters. There is no breast or ovarian cancer in the immediate family but 2 of the patient's 5 maternal aunts had breast cancer (she does not know the age of diagnosis), and one of her father's sisters had ovarian  cancer.  GYNECOLOGIC HISTORY: She had menarche age 24, no firm date for her menopause as she had hysterectomy remotely. She is GX P2, with a pair of twins and a daughter as her biological children as noted below. She never took hormone replacement.  SOCIAL HISTORY:  (Updated January 2015) She used to own a tuxedo shop. Her husband, Shea Evans, is retired but used to own a Higher education careers adviser. He is currently working in [place] with one of his own sons. At home in addition to the patient and her husband is one of the patient's daughters and her 3 children (Josh 64, Shearon Balo, and Harmon Pier 2), as well as one of Forrest sons. Altogether they have 6 children and 15 grandchildren and 1 great granddaughter. The patient attends [employer]    ADVANCED DIRECTIVES: Not in place. The patient has received advanced directives/healthcare power of attorney papers to be completed on notarized at her discretion  HEALTH MAINTENANCE:  (Updated January 2015) Social  History  Substance Use Topics  . Smoking status: Former Smoker    Packs/day: 2.00    Years: 40.00    Types: Cigarettes    Quit date: 08/25/2006  . Smokeless tobacco: Never Used  . Alcohol use Yes     Comment: 1-2 times a year     Colonoscopy: 2012  PAP: s/p hysterectomy  Bone density:  06/05/2012, Normal  Lipid panel:  August 2013, Dr. Harrell Lark   Allergies  Allergen Reactions  . Hydrocodone-Acetaminophen     Other reaction(s): Other (See Comments) syncope  . Vicodin [Hydrocodone-Acetaminophen] Other (See Comments)    syncope  . Sulfa Antibiotics Other (See Comments)    Unknown; childhood allergy  . Escitalopram Diarrhea and Nausea And Vomiting  . Sulfamethoxazole Rash    Current Outpatient Prescriptions  Medication Sig Dispense Refill  . amLODipine (NORVASC) 5 MG tablet Take 1 tablet (5 mg total) by mouth daily. 90 tablet 3  . aspirin EC 81 MG tablet Take 1 tablet (81 mg total) by mouth daily.    Marland Kitchen atorvastatin (LIPITOR) 80 MG tablet Take 1 tablet (80 mg total) by mouth daily at 6 PM. 90 tablet 0  . buPROPion (WELLBUTRIN XL) 300 MG 24 hr tablet Take 300 mg by mouth daily.     . fluticasone (FLONASE) 50 MCG/ACT nasal spray Place 2 sprays into both nostrils daily.     Marland Kitchen lisinopril (PRINIVIL,ZESTRIL) 10 MG tablet TAKE ONE TABLET BY MOUTH ONCE DAILY 90 tablet 0  . loratadine (CLARITIN) 10 MG tablet Take 10 mg by mouth daily.    . metFORMIN (GLUCOPHAGE) 500 MG tablet Take 1 tablet by mouth 2 (two) times daily.    . metoprolol (LOPRESSOR) 50 MG tablet Take 1 tablet (50 mg total) by mouth 2 (two) times daily. 180 tablet 3  . mirabegron ER (MYRBETRIQ) 50 MG TB24 tablet Take 50 mg by mouth.    . Multiple Vitamin (MULTI VITAMIN DAILY PO) Take 1 tablet by mouth daily.    . nitrofurantoin, macrocrystal-monohydrate, (MACROBID) 100 MG capsule Take 1 capsule by mouth daily.    . nitroGLYCERIN (NITROSTAT) 0.4 MG SL tablet Place 1 tablet (0.4 mg total) under the tongue every 5 (five) minutes  as needed. 25 tablet 3   No current facility-administered medications for this visit.     OBJECTIVE: Middle-aged white woman who appears stated age  There were no vitals filed for this visit.   There is no height or weight on file to calculate BMI.  ECOG FS: 1  There were no vitals filed for this visit. (weight was 266 pounds in August of 2014)  Sclerae unicteric, pupils round and equal Oropharynx clear and moist No cervical or supraclavicular adenopathy Lungs no rales or rhonchi Heart regular rate and rhythm Abd soft, obese, nontender, positive bowel sounds MSK no focal spinal tenderness, no upper extremity lymphedema Neuro: nonfocal, well oriented, appropriate affect Breasts: Status post bilateral mastectomies. There is no evidence of chest wall recurrence. Both axillae are benign.  LAB RESULTS:   Lab Results  Component Value Date   WBC 6.0 05/20/2017   NEUTROABS 3.6 05/20/2017   HGB 12.5 05/20/2017   HCT 39.2 05/20/2017   MCV 89.7 05/20/2017   PLT 181 05/20/2017      Chemistry      Component Value Date/Time   NA 140 05/20/2017 1013   K 3.9 05/20/2017 1013   CL 107 10/29/2014 0023   CO2 24 05/20/2017 1013   BUN 14.4 05/20/2017 1013   CREATININE 1.0 05/20/2017 1013      Component Value Date/Time   CALCIUM 9.2 05/20/2017 1013   ALKPHOS 72 05/20/2017 1013   AST 25 05/20/2017 1013   ALT 28 05/20/2017 1013   BILITOT 0.48 05/20/2017 1013       STUDIES: Ct Chest W Contrast  Result Date: 05/20/2017 CLINICAL DATA:  Follow-up left breast cancer diagnosed in 2013. Oral chemotherapy completed. EXAM: CT CHEST WITH CONTRAST TECHNIQUE: Multidetector CT imaging of the chest was performed during intravenous contrast administration. CONTRAST:  75m ISOVUE-300 IOPAMIDOL (ISOVUE-300) INJECTION 61% COMPARISON:  CT 05/14/2016 and 02/13/2015. FINDINGS: Patient has high-density external breast prostheses bilaterally which were not removed for the examination. These create  significant beam hardening artifact, limiting the quality of the examination. The should be removed on any subsequent imaging. Cardiovascular: There is atherosclerosis of the aorta, great vessels and coronary arteries post median sternotomy and CABG. There is dense calcification at the origin of the left subclavian artery. No acute vascular findings are evident. Aortic valvular and mitral annular calcifications again noted. The heart size is normal. There is no pericardial effusion. Mediastinum/Nodes: There are no enlarged mediastinal, hilar or axillary lymph nodes.Previous left axillary nodal dissection. No obvious internal mammary adenopathy, although evaluation limited by the artifact. The thyroid gland, trachea and esophagus demonstrate no significant findings. Lungs/Pleura: There is no pleural effusion. The lungs appear stable with mild emphysema and subpleural reticulation. There is stable linear scarring in the left upper lobe and a stable tiny right upper lobe nodule on image 43. No new or enlarging pulmonary nodules. Upper abdomen: Hepatic steatosis, cholelithiasis, a laparoscopic gastric band and a cyst in the lower pole of the left kidney are noted. Musculoskeletal/Chest wall: There is no chest wall mass or suspicious osseous finding. In addition to the external breast prostheses, the patient has bilateral subpectoral breast implants. IMPRESSION: 1. Stable chest CT without evidence of local recurrence or metastatic disease. 2. Study is limited by artifact from the external breast prostheses which should be removed on subsequent CT imaging. 3. Hepatic steatosis and cholelithiasis. 4.  Aortic Atherosclerosis (ICD10-I70.0). Electronically Signed   By: WRichardean SaleM.D.   On: 05/20/2017 15:13    CT [facility]CRE[facility]  CLINICAL DATA:71year old female former smoker (quit 11 years ago) with with 40 pack-year history of smoking. Lung  cancer screening examination. Additional history of breast cancer status post double mastectomy and chemotherapy.  EXAM: CT CHEST WITHOUT CONTRAST  LOW-DOSE FOR LUNG CANCER SCREENING  TECHNIQUE: Multidetector CT imaging of the chest was performed following the standard protocol without IV contrast.  COMPARISON:Chest CT 02/13/2015.  FINDINGS: Cardiovascular: Heart size is normal. There is no significant pericardial fluid, thickening or pericardial calcification. There is aortic atherosclerosis, as well as atherosclerosis of the great vessels of the mediastinum and the coronary arteries, including calcified atherosclerotic plaque in the left main, left anterior descending, left circumflex and right coronary arteries. Status post median sternotomy for CABG, including LIMA to the LAD. Calcifications of the aortic valve and mitral annulus.  Mediastinum/Nodes: No pathologically enlarged mediastinal or hilar lymph nodes. Please note that accurate exclusion of hilar adenopathy is limited on noncontrast CT scans. Esophagus is unremarkable in appearance. No axillary lymphadenopathy. Surgical clips in the left axilla, presumably from prior lymph node dissection.  Lungs/Pleura: Small pulmonary nodules are noted in the right lung, measuring up to a volume derived mean diameter of only 2.5 mm in the posterior aspect of the right upper lobe where there is a subpleural nodule (image 94 of series 3). No other larger more suspicious appearing pulmonary nodules or masses are noted. Area of linear scarring at site of remote left upper lobe nodule is similar to prior study 02/13/2015. No acute consolidative airspace disease. No pleural effusions. Mild linear scarring in the left lower lobe abutting the major fissure. Mild linear scarring in the inferior segment of the lingula.  Upper Abdomen: Decreased attenuation throughout the hepatic parenchyma, compatible with hepatic steatosis. LapBand in  place immediately beneath the gastroesophageal junction. Aortic atherosclerosis.  Musculoskeletal: Median sternotomy wires. There are no aggressive appearing lytic or blastic lesions noted in the visualized portions of the skeleton.  IMPRESSION: 1. Lung-RADS Category 2, benign appearance or behavior. Continue annual screening with low-dose chest CT without contrast in 12 months. 2. Aortic atherosclerosis, in addition to left main and 3 vessel coronary artery disease. Status post median sternotomy for CABG, including LIMA to the LAD. 3. There are calcifications of the aortic valve and mitral annulus. Echocardiographic correlation for evaluation of potential valvular dysfunction may be warranted if clinically indicated. 4. Hepatic steatosis.   Electronically Signed By: DanielEntrikin M.D. On: 05/14/2016 12:57   ASSESSMENT: 71 y.o. [name] woman   (1)  s/p bilateral mastectomies and left axillary lymph node dissection 10/18/2011 for a left-sided Invasive ductal carcinoma involving overlapping sites,  pT2 pN1, stage IIB, grade 2, estrogen receptor 100% and progesterone receptor 96% positive, with no HER-2 amplification (1.81, repeat 1.35), Mib-1 of 30%.   (a) The right breast showed no malignancy.  (2) Plan was to treat in the adjuvant setting with 4 dose dense cycles of doxorubicin/cyclophosphamide followed by 4 dose dense cycles of paclitaxel, with Doxorubicin given by continuous infusion over 72 hours. Patient received a truncated cycle 1, the continuous infusion disconnected after proximally 6 hours when the patient was admitted to the hospital with unstable angina.  (3)  During hospitalization, CT angio negative for PE, echo showing well-preserved EF, enzymes/ECG showing a NSTEMI, s/p cath May 7 showing left main, LAD and RCA disease with a well-preserved ejection fraction, revascularization performed 12/13/2011 with an uneventful postoperative course  (4) the patient opted  against further chemotherapy and also against post-mastectomy irradiation; she started anastrozole May 2013, completing 5 years June 2018  (a) bone density February 2016 showed osteopenia  (5) enlarging LUL mass noted on chest CT/ PET March 2015,   (a) biopsy 11/26/2013 showed no evidence of atypia or malignancy, but felt to  be possibly discordant.  (b) chest CT 05/10/2014 and 08/17/2014 show decreased prominence (post infectious scarring?)  (c) chest CT 02/13/2015 showed continued decrease in size    PLAN:  Jamie [name] is now a little more than 5 years out from definitive surgery for her breast cancer with no evidence of disease recurrence. This is very favorable.  She completed 5 years of anastrozole a few months ago. We discussed the fact that in some cases continuing anastrozole and additional 2 years may decrease the risk of recurrence outside the breast up to 1%. This is not a motivator for her and she is comfortable knowing off the medication at this point.  The one thing that she would like before being completely released is to have a repeat CT scan of the chest. I have put that in for October and made sure that a copy ago*primary care physician as well as to myself. Assuming there has been no change I do not think any further yearly CTs would be warranted in the absence of specific symptoms to evaluate.  Otherwise I feel comfortable releasing her from follow-up here. All Jamie [name] will need in terms of breast cancer follow-up is a yearly physician breast exam  Of course I will be glad to see her at any point in the future if and when the need arises, but as of now were making no further routine appointment for her here.   Chauncey Cruel, MD     05/21/2017

## 2017-07-04 ENCOUNTER — Other Ambulatory Visit: Payer: Self-pay | Admitting: Physician Assistant

## 2017-07-08 ENCOUNTER — Other Ambulatory Visit: Payer: Self-pay | Admitting: Physician Assistant

## 2017-07-08 DIAGNOSIS — I6523 Occlusion and stenosis of bilateral carotid arteries: Secondary | ICD-10-CM

## 2017-07-11 ENCOUNTER — Ambulatory Visit (HOSPITAL_COMMUNITY)
Admission: RE | Admit: 2017-07-11 | Discharge: 2017-07-11 | Disposition: A | Discharge status: Home / Self Care | Payer: Medicare Other | Source: Ambulatory Visit | Attending: Cardiovascular Disease | Admitting: Cardiovascular Disease

## 2017-07-11 DIAGNOSIS — I6523 Occlusion and stenosis of bilateral carotid arteries: Secondary | ICD-10-CM | POA: Diagnosis not present

## 2017-07-17 ENCOUNTER — Ambulatory Visit (INDEPENDENT_AMBULATORY_CARE_PROVIDER_SITE_OTHER): Payer: Medicare Other | Admitting: Internal Medicine

## 2017-07-17 ENCOUNTER — Encounter: Payer: Self-pay | Admitting: Internal Medicine

## 2017-07-17 VITALS — BP 138/62 | HR 68 | Ht 65.0 in | Wt 255.2 lb

## 2017-07-17 DIAGNOSIS — I6523 Occlusion and stenosis of bilateral carotid arteries: Secondary | ICD-10-CM | POA: Diagnosis not present

## 2017-07-17 DIAGNOSIS — R0602 Shortness of breath: Secondary | ICD-10-CM

## 2017-07-17 DIAGNOSIS — I5032 Chronic diastolic (congestive) heart failure: Secondary | ICD-10-CM | POA: Diagnosis not present

## 2017-07-17 DIAGNOSIS — I10 Essential (primary) hypertension: Secondary | ICD-10-CM

## 2017-07-17 DIAGNOSIS — I2510 Atherosclerotic heart disease of native coronary artery without angina pectoris: Secondary | ICD-10-CM | POA: Diagnosis not present

## 2017-07-17 DIAGNOSIS — I350 Nonrheumatic aortic (valve) stenosis: Secondary | ICD-10-CM | POA: Diagnosis not present

## 2017-07-17 MED ORDER — FUROSEMIDE 40 MG PO TABS: 40.0000 mg | ORAL_TABLET | Freq: Every day | ORAL | 1 refills | Status: DC

## 2017-07-17 NOTE — Progress Notes (Signed)
Follow-up Outpatient Visit Date: 07/17/2017  Primary Care Provider: Verdell Carmine., MD [address] [place] [address]  Chief Complaint: Shortness of breath  HPI:  Ms. Hoffmeister is a 71 y.o. year-old female with history of coronary artery disease status post CABG (2585), diastolic heart failure, hyperlipidemia, carotid artery stenosis, breast cancer status post bilateral mastectomies, diabetes mellitus, COPD, sleep apnea, and obesity, who presents for evaluation of shortness of breath.  She was last seen in our office in 04/2016 by Richardson Dopp, which time she was doing relatively well.  About 2-3 months ago, Ms. Greeley began noticing significant exertional dyspnea that is now present with even light activity such as walking from one room to another.  Symptoms began after she returned from a trip to [employer].  She spent most of the time getting around on her scooter.  During the trip, she noticed progressive swelling in her legs, which has not improved since then.  She denies chest pain,, orthopnea, PND, palpitations, and lightheadedness.  Ms. Scatena is particularly concerned, because worsening shortness of breath was her anginal equivalent leading up to NSTEMI and subsequent CABG in 2013.  --------------------------------------------------------------------------------------------------  Past Medical History:  Diagnosis Date  . Anxiety   . Asthma   . Breast cancer ([place] [facility]) 08/2011   lt. breast ca  . CAD (coronary artery disease)    NSTEMI 5/13:  LHC demonstrated oLM 75%, pLAD 80%, RCA occluded, filled by collaterals from the LAD.  Echo 12/10/11: EF 60%.; CABG 12/13/11: LIMA-LAD, SVG-RCA, SVG-diagonal and OM.   Marland Kitchen Carotid artery disease ([facility])    a.  Pre-CABG Dopplers 5/13: Bilateral 40-59%; b.  Carotid US 2/77: RICA 82-42%, LICA 35-36%, normal subclavians bilaterally >> FU 6 months;  c. Carotid US 14/43: RICA 15-40%; LICA 08-67%, normal subclavians >> FU 1 year // d. Carotid US 11/17:   Stable 40-59% bilateral ICA stenosis; F/U 1 year.  Marland Kitchen COPD (chronic obstructive pulmonary disease) ([facility])   . DM2 (diabetes mellitus, type 2) ([facility])    metformin  . HLD (hyperlipidemia)   . Hx of cardiovascular stress test    Myoview 5/16:  small area of inf-lat ischemia, EF 70%; Low Risk >> try medical Rx  . Hx of echocardiogram    Echo 5/16:  EF 60-65%, no RWMA, Gr 1 DD, Ao sclerosis, mild AS, mean 14 mmHg, MAC  . Hx of migraines   . Hypertension   . Myocardial infarction ([facility])   . Obesity   . Shortness of breath   . Sleep apnea    sleep study2-3 yrs ago lost weight afterward but gained back  . UTI (lower urinary tract infection)    freq-bladder implant -removed   Past Surgical History:  Procedure Laterality Date  . ABDOMINAL HYSTERECTOMY  1977  . APPENDECTOMY    . AXILLARY LYMPH NODE DISSECTION  10/18/2011   Procedure: AXILLARY LYMPH NODE DISSECTION;  Surgeon: Merrie Roof, MD;  Location: [facility];  Service: General;  Laterality: Left;  . BREAST RECONSTRUCTION  10/18/2011   Procedure: BREAST RECONSTRUCTION;  Surgeon: Theodoro Kos, DO;  Location: [facility];  Service: Plastics;  Laterality: Bilateral;   bilateral breast reconstruction with bilateral tissue expander and placement of flex hd.  . CESAREAN SECTION  04/1976  . CORONARY ARTERY BYPASS GRAFT  12/13/2011   Procedure: CORONARY ARTERY BYPASS GRAFTING (CABG);  Surgeon: Gaye Pollack, MD;  Location: [facility];  Service: Open Heart Surgery;  Laterality: N/A;  Times four using endoscopically harvested  left greater saphenous vein and left internal mammary artery. Right greater saphenous vein attempted; not appropriate for vein harvest.  . LAPAROSCOPIC GASTRIC BANDING  2010  . LEFT HEART CATHETERIZATION WITH CORONARY ANGIOGRAM N/A 12/11/2011   Procedure: LEFT HEART CATHETERIZATION WITH CORONARY ANGIOGRAM;  Surgeon: Larey Dresser, MD;  Location: [facility] CATH LAB;  Service: Cardiovascular;  Laterality: N/A;  . LESION REMOVAL  04/09/2012   Procedure:  LESION REMOVAL;  Surgeon: Theodoro Kos, DO;  Location: [facility];  Service: Plastics;  Laterality: Bilateral;  . MASTECTOMY W/ SENTINEL NODE BIOPSY  10/18/2011   Procedure: MASTECTOMY WITH SENTINEL LYMPH NODE BIOPSY;  Surgeon: Merrie Roof, MD;  Location: [facility];  Service: General;  Laterality: Bilateral;  bilateral mastectomy and left sentinel node biopsy  . NASAL SINUS SURGERY    . PORT-A-CATH REMOVAL  04/09/2012   Procedure: REMOVAL PORT-A-CATH;  Surgeon: Merrie Roof, MD;  Location: [facility];  Service: General;  Laterality: Right;  . PORTACATH PLACEMENT  12/02/2011   Procedure: INSERTION PORT-A-CATH;  Surgeon: Merrie Roof, MD;  Location: [facility];  Service: General;  Laterality: Right;  . TONSILLECTOMY    . US ECHOCARDIOGRAPHY     at [facility] NAVIGATION Left 11/26/2013   Procedure: VIDEO BRONCHOSCOPY WITH ENDOBRONCHIAL NAVIGATION;  Surgeon: Collene Gobble, MD;  Location: [facility];  Service: Thoracic;  Laterality: Left;    Current Meds  Medication Sig  . amLODipine (NORVASC) 5 MG tablet Take 1 tablet (5 mg total) by mouth daily. Please make overdue yearly appt with Dr. Saunders Revel before anymore refills. 2nd attempt  . aspirin EC 81 MG tablet Take 1 tablet (81 mg total) by mouth daily.  Marland Kitchen atorvastatin (LIPITOR) 80 MG tablet Take 1 tablet (80 mg total) by mouth daily at 6 PM. Please make overdue yearly appt with Dr. Saunders Revel before anymore refills. 2nd attempt  . buPROPion (WELLBUTRIN XL) 150 MG 24 hr tablet Take 1 tablet by mouth daily.  . cephALEXin (KEFLEX) 500 MG capsule Take 1 capsule by mouth daily. abx for bladder issues  . fluticasone (FLONASE) 50 MCG/ACT nasal spray Place 2 sprays into both nostrils daily.   Marland Kitchen lisinopril (PRINIVIL,ZESTRIL) 10 MG tablet TAKE ONE TABLET BY MOUTH ONCE DAILY  . loratadine (CLARITIN) 10 MG tablet Take 10 mg by mouth daily.  . metFORMIN (GLUCOPHAGE) 500 MG tablet Take 1 tablet by mouth 2  (two) times daily.  . metoprolol tartrate (LOPRESSOR) 50 MG tablet Take 1 tablet (50 mg total) by mouth 2 (two) times daily. Please make overdue yearly appt with Dr. Saunders Revel before anymore refills. 2nd attempt  . mirabegron ER (MYRBETRIQ) 50 MG TB24 tablet Take 50 mg by mouth.  . Multiple Vitamin (MULTI VITAMIN DAILY PO) Take 1 tablet by mouth daily.  . nitrofurantoin, macrocrystal-monohydrate, (MACROBID) 100 MG capsule Take 1 capsule by mouth daily.  . nitroGLYCERIN (NITROSTAT) 0.4 MG SL tablet Place 1 tablet (0.4 mg total) under the tongue every 5 (five) minutes as needed.    Allergies: Hydrocodone-acetaminophen; Vicodin [hydrocodone-acetaminophen]; Sulfa antibiotics; Escitalopram; and Sulfamethoxazole  Social History   Socioeconomic History  . Marital status: Married    Spouse name: Not on file  . Number of children: 6  . Years of education: Not on file  . Highest education level: Not on file  Social Needs  . Financial resource strain: Not on file  . Food insecurity - worry: Not on file  .  Food insecurity - inability: Not on file  . Transportation needs - medical: Not on file  . Transportation needs - non-medical: Not on file  Occupational History  . Occupation: retired  Tobacco Use  . Smoking status: Former Smoker    Packs/day: 2.00    Years: 40.00    Pack years: 80.00    Types: Cigarettes    Last attempt to quit: 08/25/2006    Years since quitting: 10.9  . Smokeless tobacco: Never Used  Substance and Sexual Activity  . Alcohol use: Yes    Comment: 1-2 times a year  . Drug use: No  . Sexual activity: Not Currently  Other Topics Concern  . Not on file  Social History Narrative  . Not on file    Family History  Problem Relation Age of Onset  . Heart disease Mother   . Heart disease Father   . Breast cancer Maternal Aunt   . Ovarian cancer Paternal Aunt   . Breast cancer Maternal Aunt   . Anesthesia problems Neg Hx   . Hypotension Neg Hx   . Malignant hyperthermia  Neg Hx   . Pseudochol deficiency Neg Hx     Review of Systems: A 12-system review of systems was performed and was negative except as noted in the HPI.  --------------------------------------------------------------------------------------------------  Physical Exam: BP 138/62   Pulse 68   Ht _0  (1.651 m)   Wt 255 lb 3.2 oz (115.8 kg)   SpO2 98%   BMI 42.47 kg/m   General: Morbidly obese woman, seated in the exam room.  She appears somewhat anxious. HEENT: No conjunctival pallor or scleral icterus. Moist mucous membranes.  OP clear. Neck: Supple without lymphadenopathy or thyromegaly.  JVP difficult to assess due to body habitus.  No carotid bruits. Lungs: Normal work of breathing.  Breath sounds are mildly diminished throughout without wheezes or crackles. Heart: Distant heart sounds.  Regular rate and rhythm without murmurs, rubs, or gallops.  Unable to assess PMI due to body habitus. Abd: Bowel sounds present.  Obese, soft, nontender, nondistended.  Unable to assess HSM due to body habitus. Ext: 2+ pretibial edema bilaterally.  2+ radial and pedal pulses bilaterally. Skin: Warm and dry without rash.  EKG:  Normal sinus rhythm with borderline inferior Q-waves. No significant change from prior tracing on 04/26/16.  Lab Results  Component Value Date   WBC 6.0 05/20/2017   HGB 12.5 05/20/2017   HCT 39.2 05/20/2017   MCV 89.7 05/20/2017   PLT 181 05/20/2017    Lab Results  Component Value Date   NA 140 05/20/2017   K 3.9 05/20/2017   CL 107 10/29/2014   CO2 24 05/20/2017   BUN 14.4 05/20/2017   CREATININE 1.0 05/20/2017   GLUCOSE 99 05/20/2017   ALT 28 05/20/2017    Lab Results  Component Value Date   CHOL 126 03/10/2012   HDL 41.10 03/10/2012   LDLCALC 49 03/10/2012   TRIG 182.0 (H) 03/10/2012   CHOLHDL 3 03/10/2012    --------------------------------------------------------------------------------------------------  ASSESSMENT AND PLAN: Shortness of  breath with chronic diastolic heart failure Shortness of breath could be due to any number of factors.  Given lower extremity edema and recent trip to [employer] with extended periods of immobilization, pulmonary embolism is a consideration.  Worsening heart failure and/or coronary insufficiency are also considerations, particularly given the patient's presentation with just shortness of breath at the time of NSTEMI in 2013.  We have agreed to check a  d-dimer today.  If it is elevated, we will need to obtain an urgent CT of the chest to exclude PE.  I will make arrangements for echocardiogram within the next week.  Based on the results, will likely need to proceed with cardiac catheterization thereafter.  In the meantime, we will start furosemide 40 mg daily.  I will check a CBC and basic metabolic panel today.  Coronary artery disease Though Ms. Langhans does not have any chest pain, I am concerned that her progressive dyspnea may reflect an anginal equivalent.  EKG today is unchanged.  We will begin with echocardiogram, as outlined above.  I suspect that we will need to proceed with cardiac catheterization shortly thereafter.  We will continue current doses of aspirin and metoprolol.  Aortic stenosis This was noted to be mild by most recent echo in 2016.  It would be unusual for her aortic stenosis to progress to severe stage to explain her dyspnea in such a short a time frame.  However, I feel it is important to assess this with repeat echocardiogram, which we will do within the next week.  Hypertension Blood pressure mildly elevated today (goal less than 130/80).  Patient encouraged to limit salt intake.  I will not make any other medication changes at this time.  Carotid artery stenosis No neurologic symptoms reported.  Carotid Dopplers last week showed mild stenosis (less than 40%) involving both internal carotid arteries.  Continue medical therapy and risk factor modification.  Follow-up: Return  to clinic in 1 month.  Nelva Bush, MD 07/17/2017 4:12 PM

## 2017-07-17 NOTE — Patient Instructions (Addendum)
Medication Instructions:  Start lasix (furosemide) 40 mg daily.  Labwork: BMET/CBCd/D-Dimer today  Testing/Procedures: Your physician has requested that you have an echocardiogram. Echocardiography is a painless test that uses sound waves to create images of your heart. It provides your doctor with information about the size and shape of your heart and how well your heart's chambers and valves are working. This procedure takes approximately one hour. There are no restrictions for this procedure.  NEXT WEEK PLEASE  Follow-Up: Your physician recommends that you schedule a follow-up appointment in: 1 month with Richardson Dopp, PA or Dr End.       If you need a refill on your cardiac medications before your next appointment, please call your pharmacy.

## 2017-07-18 ENCOUNTER — Telehealth: Payer: Self-pay | Admitting: *Deleted

## 2017-07-18 ENCOUNTER — Encounter: Payer: Self-pay | Admitting: Internal Medicine

## 2017-07-18 DIAGNOSIS — I10 Essential (primary) hypertension: Secondary | ICD-10-CM | POA: Insufficient documentation

## 2017-07-18 DIAGNOSIS — I2510 Atherosclerotic heart disease of native coronary artery without angina pectoris: Secondary | ICD-10-CM

## 2017-07-18 DIAGNOSIS — R0602 Shortness of breath: Secondary | ICD-10-CM | POA: Insufficient documentation

## 2017-07-18 DIAGNOSIS — I5033 Acute on chronic diastolic (congestive) heart failure: Secondary | ICD-10-CM | POA: Insufficient documentation

## 2017-07-18 DIAGNOSIS — I5032 Chronic diastolic (congestive) heart failure: Secondary | ICD-10-CM | POA: Insufficient documentation

## 2017-07-18 LAB — CBC WITH DIFFERENTIAL/PLATELET
BASOS ABS: 0 10*3/uL (ref 0.0–0.2)
Basos: 0 %
EOS (ABSOLUTE): 0.2 10*3/uL (ref 0.0–0.4)
Eos: 2 %
HEMATOCRIT: 39.5 % (ref 34.0–46.6)
HEMOGLOBIN: 12.9 g/dL (ref 11.1–15.9)
LYMPHS: 28 %
Lymphocytes Absolute: 1.8 10*3/uL (ref 0.7–3.1)
MCH: 27.6 pg (ref 26.6–33.0)
MCHC: 32.7 g/dL (ref 31.5–35.7)
MCV: 84 fL (ref 79–97)
MONOS ABS: 0.6 10*3/uL (ref 0.1–0.9)
Monocytes: 9 %
NEUTROS PCT: 61 %
Neutrophils Absolute: 3.9 10*3/uL (ref 1.4–7.0)
Platelets: 207 10*3/uL (ref 150–379)
RBC: 4.68 x10E6/uL (ref 3.77–5.28)
RDW: 13.6 % (ref 12.3–15.4)
WBC: 6.5 10*3/uL (ref 3.4–10.8)

## 2017-07-18 LAB — BASIC METABOLIC PANEL
BUN/Creatinine Ratio: 19 (ref 12–28)
BUN: 20 mg/dL (ref 8–27)
CALCIUM: 9.9 mg/dL (ref 8.7–10.3)
CHLORIDE: 105 mmol/L (ref 96–106)
CO2: 29 mmol/L (ref 20–29)
Creatinine, Ser: 1.06 mg/dL — ABNORMAL HIGH (ref 0.57–1.00)
GFR calc Af Amer: 61 mL/min/{1.73_m2} (ref >59)
GFR calc non Af Amer: 53 mL/min/{1.73_m2} — ABNORMAL LOW (ref >59)
GLUCOSE: 111 mg/dL — AB (ref 65–99)
POTASSIUM: 4.3 mmol/L (ref 3.5–5.2)
SODIUM: 139 mmol/L (ref 134–144)

## 2017-07-18 LAB — D-DIMER, QUANTITATIVE (NOT AT ARMC): D-DIMER: 0.5 mg{FEU}/L — AB (ref 0.00–0.49)

## 2017-07-18 NOTE — Telephone Encounter (Signed)
-----   Message from Nelva Bush, MD sent at 07/18/2017  7:06 AM EST ----- Please let Ms. Ciesla know that her d-dimer is negative when adjusted for age (negative is < 0.72). Labs are otherwise stable. I recommend that we proceed with echo and furosemide, as discussed yesterday. We should repeat a BMP when she returns next week for her echo to ensure stable renal function and potassium.

## 2017-07-18 NOTE — Telephone Encounter (Signed)
Discussed with patient

## 2017-07-24 ENCOUNTER — Ambulatory Visit (HOSPITAL_COMMUNITY): Payer: Medicare Other | Attending: Cardiovascular Disease

## 2017-07-24 ENCOUNTER — Other Ambulatory Visit: Payer: Medicare Other | Admitting: *Deleted

## 2017-07-24 ENCOUNTER — Other Ambulatory Visit: Payer: Self-pay

## 2017-07-24 DIAGNOSIS — R0602 Shortness of breath: Secondary | ICD-10-CM | POA: Insufficient documentation

## 2017-07-24 DIAGNOSIS — I350 Nonrheumatic aortic (valve) stenosis: Secondary | ICD-10-CM | POA: Diagnosis not present

## 2017-07-24 DIAGNOSIS — I371 Nonrheumatic pulmonary valve insufficiency: Secondary | ICD-10-CM | POA: Diagnosis not present

## 2017-07-24 DIAGNOSIS — I071 Rheumatic tricuspid insufficiency: Secondary | ICD-10-CM | POA: Insufficient documentation

## 2017-07-24 DIAGNOSIS — I2510 Atherosclerotic heart disease of native coronary artery without angina pectoris: Secondary | ICD-10-CM

## 2017-07-24 LAB — BASIC METABOLIC PANEL
BUN/Creatinine Ratio: 21 (ref 12–28)
BUN: 21 mg/dL (ref 8–27)
CO2: 22 mmol/L (ref 20–29)
Calcium: 9.5 mg/dL (ref 8.7–10.3)
Chloride: 103 mmol/L (ref 96–106)
Creatinine, Ser: 0.99 mg/dL (ref 0.57–1.00)
GFR calc Af Amer: 66 mL/min/{1.73_m2} (ref >59)
GFR, EST NON AFRICAN AMERICAN: 57 mL/min/{1.73_m2} — AB (ref >59)
Glucose: 180 mg/dL — ABNORMAL HIGH (ref 65–99)
POTASSIUM: 3.9 mmol/L (ref 3.5–5.2)
SODIUM: 140 mmol/L (ref 134–144)

## 2017-07-24 MED ORDER — PERFLUTREN LIPID MICROSPHERE
1.0000 mL | INTRAVENOUS | Status: AC | PRN
  Administered 2017-07-24: 1 mL via INTRAVENOUS

## 2017-07-28 ENCOUNTER — Telehealth: Payer: Self-pay | Admitting: Internal Medicine

## 2017-07-28 NOTE — Telephone Encounter (Signed)
Follow up    Patient returning call for lab results. Please call

## 2017-07-28 NOTE — Telephone Encounter (Signed)
Follow Up: ° ° ° °Returning your call, concerning her lab results. °

## 2017-07-30 ENCOUNTER — Other Ambulatory Visit: Payer: Self-pay | Admitting: Physician Assistant

## 2017-07-30 NOTE — Telephone Encounter (Signed)
lmtcb to go over test results

## 2017-08-01 NOTE — Telephone Encounter (Signed)
Pt called back and has been notified of both echo and lab results by phone with verbal understanding. Asked how pt was her breathing any better. Pt states somewhat however not really. Advised pt of recommendation to keep appt with Brynda Rim. PA 08/20/17 to discuss possible cath . Pt is agreeable to plan of care and thanked me for my call.

## 2017-08-03 ENCOUNTER — Other Ambulatory Visit: Payer: Self-pay | Admitting: Physician Assistant

## 2017-08-03 DIAGNOSIS — I10 Essential (primary) hypertension: Secondary | ICD-10-CM

## 2017-08-03 DIAGNOSIS — I25119 Atherosclerotic heart disease of native coronary artery with unspecified angina pectoris: Secondary | ICD-10-CM

## 2017-08-13 ENCOUNTER — Ambulatory Visit (INDEPENDENT_AMBULATORY_CARE_PROVIDER_SITE_OTHER): Payer: Medicare Other | Admitting: Physician Assistant

## 2017-08-13 ENCOUNTER — Encounter: Payer: Self-pay | Admitting: Physician Assistant

## 2017-08-13 VITALS — BP 128/62 | HR 78 | Ht 65.0 in | Wt 248.4 lb

## 2017-08-13 DIAGNOSIS — I25119 Atherosclerotic heart disease of native coronary artery with unspecified angina pectoris: Secondary | ICD-10-CM | POA: Diagnosis not present

## 2017-08-13 DIAGNOSIS — R0602 Shortness of breath: Secondary | ICD-10-CM

## 2017-08-13 DIAGNOSIS — I209 Angina pectoris, unspecified: Secondary | ICD-10-CM

## 2017-08-13 DIAGNOSIS — I10 Essential (primary) hypertension: Secondary | ICD-10-CM

## 2017-08-13 DIAGNOSIS — R079 Chest pain, unspecified: Secondary | ICD-10-CM | POA: Diagnosis not present

## 2017-08-13 DIAGNOSIS — I5032 Chronic diastolic (congestive) heart failure: Secondary | ICD-10-CM | POA: Diagnosis not present

## 2017-08-13 DIAGNOSIS — I350 Nonrheumatic aortic (valve) stenosis: Secondary | ICD-10-CM | POA: Diagnosis not present

## 2017-08-13 MED ORDER — NITROGLYCERIN 0.4 MG SL SUBL: 0.4000 mg | SUBLINGUAL_TABLET | SUBLINGUAL | 3 refills | Status: DC | PRN

## 2017-08-13 NOTE — Patient Instructions (Addendum)
Medication Instructions:  Your physician recommends that you continue on your current medications as directed. Please refer to the Current Medication list given to you today.   Labwork: TODAY:  BMET, CBC, & PT/INR  Testing/Procedures: Your physician has requested that you have a cardiac catheterization. Cardiac catheterization is used to diagnose and/or treat various heart conditions. Doctors may recommend this procedure for a number of different reasons. The most common reason is to evaluate chest pain. Chest pain can be a symptom of coronary artery disease (CAD), and cardiac catheterization can show whether plaque is narrowing or blocking your heart's arteries. This procedure is also used to evaluate the valves, as well as measure the blood flow and oxygen levels in different parts of your heart. For further information please visit [email or web address]. Please follow instruction sheet, as given.   Follow-Up: Your physician recommends that you schedule a follow-up appointment in: [place], PA-C      Any Other Special Instructions Will Be Listed Below (If Applicable).   [facility] OFFICE [address] [place] [address] Dept: [phone] Loc: [facility]  08/13/2017  You are scheduled for a Cardiac Catheterization on Friday, January 18 with Dr. Harrell Gave End.  1. Please arrive at the [facility] (Main Entrance A) at [facility]: [place], [place] [address] at 5:30 AM (two hours before your procedure to ensure your preparation). Free valet parking service is available.   Special note: Every effort is made to have your procedure done on time. Please understand that emergencies sometimes delay scheduled procedures.  2. Diet: Do not eat or drink anything after midnight prior to your procedure except sips of water to take  medications.  3. Labs: WILL BE DRAWN TODAY WHILE YOU ARE HERE  4. Medication instructions in preparation for your procedure:  STOP THE METFORMIN ON January 16 (after the PM dose) and RESTART IT 48 HOURS AFTER YOUR PROCEDURE.  HOLD YOUR LASIX THE MORNING OF YOUR PROCEDURE   On the morning of your procedure, take your Aspirin and any morning medicines NOT listed above.  You may use sips of water.  5. Plan for one night stay--bring personal belongings. 6. Bring a current list of your medications and current insurance cards. 7. You MUST have a responsible person to drive you home. 8. Someone MUST be with you the first 24 hours after you arrive home or your discharge will be delayed. 9. Please wear clothes that are easy to get on and off and wear slip-on shoes.  Thank you for allowing [place] to care for you!   --  Invasive Cardiovascular services     If you need a refill on your cardiac medications before your next appointment, please call your pharmacy.

## 2017-08-13 NOTE — H&P (View-Only) (Signed)
Cardiology Office Note:    Date:  08/13/2017   ID:  Jamie [name], DOB 15-Sep-1945, MRN [id number]  PCP:  Jamie [name]., MD  Cardiologist:  Jamie Bush, MD   Referring MD: Jamie [name]., MD   Chief Complaint  Patient presents with  . Shortness of Breath    History of Present Illness:    Jamie [name] is a 72 y.o. female with a hx of coronary artery disease status post non-STEMI in May 2013 followed by CABG, diastolic heart failure, breast cancer status post bilateral mastectomies, diabetes, sleep apnea, COPD, carotid artery disease, obesity.  Nuclear stress test in May 2016 demonstrated a small area of inferolateral ischemia.  This is felt to be low risk and she was treated medically.  She was last seen in clinic by Jamie [name] End December 2018.  She presented with complaints of worsening dyspnea and lower extremity edema.  She had recently been on a trip to [place].  D-dimer was negative for pulmonary embolism.  Echocardiogram demonstrated normal LV function with mild aortic stenosis.  It was felt that if her breathing did not improve, she would likely need left heart catheterization.  Jamie [name] returns for follow-up.  She is here alone.  Since last seen, she has been treated for gout and bronchitis.  She had 2 rounds of antibiotics and developed diarrhea.  She continues to feel short of breath with minimal activity.  She had some discomfort in her chest one night while lying in her recliner.  She did not take nitroglycerin.  She did not have any associated symptoms.  Overall, her breathing is worse some days and better other days.  She denies PND.  Her edema is improved.  She denies syncope.  Prior CV studies:   The following studies were reviewed today:  Echo 07/24/17 Mild concentric LVH, EF 40-98, normal diastolic function, mild aortic stenosis (mean 14, peak 31; LVOT/AV 0.46), MAC, mild LAE  Carotid US 07/11/17 Bilateral ICA 1-39 >> Follow-up 2 years  Chest CT  05/20/17 IMPRESSION: 1. Stable chest CT without evidence of local recurrence or metastatic disease. 2. Study is limited by artifact from the external breast prostheses which should be removed on subsequent CT imaging. 3. Hepatic steatosis and cholelithiasis. 4.  Aortic Atherosclerosis (ICD10-I70.0).  Carotid US 11/91:  RICA 47-82%; LICA 95-62%, normal subclavians >> FU 1 year  Myoview 12/14/14 Myocardial perfusion is abnormal. Findings consistent with basal inferolateral ischemia. This is a low risk study. Overall left ventricular systolic function was normal. LV cavity size is normal. The left ventricular ejection fraction is hyperdynamic (>65%). There is no prior study for comparison.  Echo 12/13/14 EF 60-65%, Normal wall motion, grade 1 diastolic dysfunction, aortic sclerosis, mild aortic stenosis, mean gradient 14 mmHg, MAC   Echo 02/04/13 - EF 60% to 65%. Wall motion was normal;  - Aortic valve: Valve area: 3.07cm^2(VTI). Valve area: 3.14cm^2 (Vmax).   Past Medical History:  Diagnosis Date  . Anxiety   . Asthma   . Breast cancer ([place]) 08/2011   lt. breast ca  . CAD (coronary artery disease)    NSTEMI 5/13:  LHC demonstrated oLM 75%, pLAD 80%, RCA occluded, filled by collaterals from the LAD.  Echo 12/10/11: EF 60%.; CABG 12/13/11: LIMA-LAD, SVG-RCA, SVG-diagonal and OM.   Marland Kitchen Carotid artery disease ([place])    a.  Pre-CABG Dopplers 5/13: Bilateral 40-59%; b.  Carotid US 1/30: RICA 86-57%, LICA 84-69%, normal subclavians bilaterally >> FU 6 months;  c. Carotid US 82/70: RICA 78-67%; LICA 54-49%, normal subclavians >> FU 1 year // d. Carotid US 11/17:  Stable 40-59% bilateral ICA stenosis; F/U 1 year.  Marland Kitchen COPD (chronic obstructive pulmonary disease) ([place])   . DM2 (diabetes mellitus, type 2) (HCC)    metformin  . HLD (hyperlipidemia)   . Hx of cardiovascular stress test    Myoview 5/16:  small area of inf-lat ischemia, EF 70%; Low Risk >> try medical Rx  . Hx of echocardiogram     Echo 5/16:  EF 60-65%, no RWMA, Gr 1 DD, Ao sclerosis, mild AS, mean 14 mmHg, MAC  . Hx of migraines   . Hypertension   . Myocardial infarction ([place])   . Obesity   . Shortness of breath   . Sleep apnea    sleep study2-3 yrs ago lost weight afterward but gained back  . UTI (lower urinary tract infection)    freq-bladder implant -removed    Past Surgical History:  Procedure Laterality Date  . ABDOMINAL HYSTERECTOMY  1977  . APPENDECTOMY    . AXILLARY LYMPH NODE DISSECTION  10/18/2011   Procedure: AXILLARY LYMPH NODE DISSECTION;  Surgeon: Merrie Roof, MD;  Location: [facility];  Service: General;  Laterality: Left;  . BREAST RECONSTRUCTION  10/18/2011   Procedure: BREAST RECONSTRUCTION;  Surgeon: Theodoro Kos, DO;  Location: [place];  Service: Plastics;  Laterality: Bilateral;   bilateral breast reconstruction with bilateral tissue expander and placement of flex hd.  . CESAREAN SECTION  04/1976  . CORONARY ARTERY BYPASS GRAFT  12/13/2011   Procedure: CORONARY ARTERY BYPASS GRAFTING (CABG);  Surgeon: Gaye Pollack, MD;  Location: [facility];  Service: Open Heart Surgery;  Laterality: N/A;  Times four using endoscopically harvested left greater saphenous vein and left internal mammary artery. Right greater saphenous vein attempted; not appropriate for vein harvest.  . LAPAROSCOPIC GASTRIC BANDING  2010  . LEFT HEART CATHETERIZATION WITH CORONARY ANGIOGRAM N/A 12/11/2011   Procedure: LEFT HEART CATHETERIZATION WITH CORONARY ANGIOGRAM;  Surgeon: Larey Dresser, MD;  [facility] C[facility];  Service: Cardiovascular;  Laterality: N/A;  . LESION REMOVAL  04/09/2012   Procedure: LESION REMOVAL;  Surgeon: Theodoro Kos, DO;  Location: [facility] [place];  Service: Plastics;  Laterality: Bilateral;  . MASTECTOMY W/ SENTINEL NODE BIOPSY  10/18/2011   Procedure: MASTECTOMY WITH SENTINEL LYMPH NODE BIOPSY;  Surgeon: Merrie Roof, MD;  Location: [facility];  Service: General;  Laterality: Bilateral;  bilateral  mastectomy and left sentinel node biopsy  . NASAL SINUS SURGERY    . PORT-A-CATH REMOVAL  04/09/2012   Procedure: REMOVAL PORT-A-CATH;  Surgeon: Merrie Roof, MD;  Location: [facility];  Service: General;  Laterality: Right;  . PORTACATH PLACEMENT  12/02/2011   Procedure: INSERTION PORT-A-CATH;  Surgeon: Merrie Roof, MD;  Location: [facility];  Service: General;  Laterality: Right;  . TONSILLECTOMY    . US ECHOCARDIOGRAPHY     at [place] NAVIGATION Left 11/26/2013   Procedure: VIDEO BRONCHOSCOPY WITH ENDOBRONCHIAL NAVIGATION;  Surgeon: Collene Gobble, MD;  Location: [facility];  Service: Thoracic;  Laterality: Left;    Current Medications: Current Meds  Medication Sig  . amLODipine (NORVASC) 5 MG tablet TAKE 1 TABLET BY MOUTH ONCE DAILY -PLEASE  MAKE  OVERDUE  YEARLY  APPOINTMENT  WITH  DR.  END  BEFORE  ANYMORE  REFILLS.  [phone].  Marland Kitchen aspirin  EC 81 MG tablet Take 1 tablet (81 mg total) by mouth daily.  Marland Kitchen atorvastatin (LIPITOR) 80 MG tablet Take 1 tablet (80 mg total) by mouth daily at 6 PM. Please make overdue yearly appt with Dr. Saunders Revel before anymore refills. 2nd attempt  . buPROPion (WELLBUTRIN XL) 150 MG 24 hr tablet Take 1 tablet by mouth daily.  . cephALEXin (KEFLEX) 500 MG capsule Take 1 capsule by mouth daily. abx for bladder issues  . fluticasone (FLONASE) 50 MCG/ACT nasal spray Place 2 sprays into both nostrils daily.   . furosemide (LASIX) 40 MG tablet Take 1 tablet (40 mg total) by mouth daily.  Marland Kitchen lisinopril (PRINIVIL,ZESTRIL) 10 MG tablet TAKE 1 TABLET BY MOUTH ONCE DAILY(NEEDS TO CALL OFFICE FOR APPT)  . loratadine (CLARITIN) 10 MG tablet Take 10 mg by mouth daily.  . metFORMIN (GLUCOPHAGE) 500 MG tablet Take 1 tablet by mouth 2 (two) times daily.  . metoprolol tartrate (LOPRESSOR) 50 MG tablet TAKE 1 TABLET BY MOUTH TWICE DAILY(PLEASE MAKE OVERDUE YEARLY APPT WITH DR BEFORE ANY MORE REFILLS 2ND ATTEMPT)  . mirabegron  ER (MYRBETRIQ) 50 MG TB24 tablet Take 50 mg by mouth.  . Multiple Vitamin (MULTI VITAMIN DAILY PO) Take 1 tablet by mouth daily.  . nitroGLYCERIN (NITROSTAT) 0.4 MG SL tablet Place 1 tablet (0.4 mg total) under the tongue every 5 (five) minutes as needed.     Allergies:   Hydrocodone-acetaminophen; Vicodin [hydrocodone-acetaminophen]; Sulfa antibiotics; Escitalopram; and Sulfamethoxazole   Social History   Tobacco Use  . Smoking status: Former Smoker    Packs/day: 2.00    Years: 40.00    Pack years: 80.00    Types: Cigarettes    Last attempt to quit: 08/25/2006    Years since quitting: 10.9  . Smokeless tobacco: Never Used  Substance Use Topics  . Alcohol use: Yes    Comment: 1-2 times a year  . Drug use: No     Family Hx: The patient's family history includes Breast cancer in her maternal aunt and maternal aunt; Heart disease in her father and mother; Ovarian cancer in her paternal aunt. There is no history of Anesthesia problems, Hypotension, Malignant hyperthermia, or Pseudochol deficiency.  ROS:   Please see the history of present illness.    Review of Systems  Respiratory: Positive for cough and wheezing.   Gastrointestinal: Positive for constipation, diarrhea and nausea.   All other systems reviewed and are negative.   EKGs/Labs/Other Test Reviewed:    EKG:  EKG is not ordered today.    Recent Labs: 05/20/2017: ALT 28 07/17/2017: Hemoglobin 12.9; Platelets 207 07/24/2017: BUN 21; Creatinine, Ser 0.99; Potassium 3.9; Sodium 140   Recent Lipid Panel Lab Results  Component Value Date/Time   CHOL 126 03/10/2012 10:15 AM   TRIG 182.0 (H) 03/10/2012 10:15 AM   HDL 41.10 03/10/2012 10:15 AM   CHOLHDL 3 03/10/2012 10:15 AM   LDLCALC 49 03/10/2012 10:15 AM    Physical Exam:    VS:  BP 128/62   Pulse 78   Ht _0  (1.651 m)   Wt 248 lb 6.4 oz (112.7 kg)   SpO2 98%   BMI 41.34 kg/m     Wt Readings from Last 3 Encounters:  08/13/17 248 lb 6.4 oz (112.7 kg)    07/17/17 255 lb 3.2 oz (115.8 kg)  02/18/17 251 lb (113.9 kg)     Physical Exam  Constitutional: She is oriented to person, place, and time. She appears well-developed and well-nourished. No  distress.  HENT:  Head: Normocephalic and atraumatic.  Neck: No JVD (at 90 degrees) present.  Cardiovascular: Normal rate and regular rhythm.  No murmur heard. Pulmonary/Chest: Effort normal. She has no wheezes. She has no rales.  Abdominal: Soft.  Musculoskeletal: She exhibits no edema.  Neurological: She is alert and oriented to person, place, and time.  Skin: Skin is warm and dry.    ASSESSMENT:    1. Shortness of breath   2. Coronary artery disease involving native coronary artery of native heart with angina pectoris ([place])   3. Chronic diastolic heart failure ([place])   4. Nonrheumatic aortic valve stenosis   5. Essential hypertension    PLAN:    In order of problems listed above:  1. Shortness of breath She continues to struggle with dyspnea with minimal activity.  She has not had significant improvement in her breathing with initiation of diuresis.  She had similar symptoms prior to her myocardial infarction in 2013.  I suspect her symptoms are an anginal equivalent.  Jamie [name] End also felt that she would need cardiac catheterization if her breathing did not improve with diuresis.  I reviewed this with Dr. Saunders Revel by phone today.  We have recommended proceeding with a right and left cardiac catheterization.  Risks and benefits of cardiac catheterization have been discussed with the patient.  These include bleeding, infection, kidney damage, stroke, heart attack, death.  The patient understands these risks and is willing to proceed.   2. Coronary artery disease Status post myocardial infarction in 2013 followed by CABG.  Low risk stress test in 2016.  Recently, she has had worsening dyspnea with exertion concerning for anginal equivalent.  As noted, she will be set up for right and left  heart catheterization.  Continue amlodipine, aspirin, statin, ACE inhibitor, beta-blocker.  She knows to go to the emergency room if she has any worsening symptoms prior to her planned procedure.  -R/L heart catheterization with Jamie [name] End  08/22/17  3. Chronic diastolic heart failure (HCC) Volume status appears stable.  Continue current therapy.  Basic metabolic panel will be obtained today as part of her pre-cardiac catheterization labs.  4. Nonrheumatic aortic valve stenosis Mild aortic stenosis by recent echocardiogram.  5. Essential hypertension The patient's blood pressure is controlled on her current regimen.  Continue current therapy.    Dispo:  Return in about 2 weeks (around 08/27/2017) for Post Procedure Follow Up, w/ Dr. Saunders Revel, or Richardson Dopp, PA-C.   Medication Adjustments/Labs and Tests Ordered: Current medicines are reviewed at length with the patient today.  Concerns regarding medicines are outlined above.  Tests Ordered: Orders Placed This Encounter  Procedures  . Basic metabolic panel  . CBC  . INR/PT   Medication Changes: No orders of the defined types were placed in this encounter.   Signed, Richardson Dopp, PA-C  08/13/2017 12:08 PM    [facility] [address], [place], [place]  [address] Phone: [phone]; Fax: [phone]

## 2017-08-13 NOTE — Progress Notes (Signed)
Cardiology Office Note:    Date:  08/13/2017   ID:  Jamie [name], DOB 15-Sep-1945, MRN [id number]  PCP:  Verdell Carmine., MD  Cardiologist:  Nelva Bush, MD   Referring MD: Verdell Carmine., MD   Chief Complaint  Patient presents with  . Shortness of Breath    History of Present Illness:    Jamie [name] is a 72 y.o. female with a hx of coronary artery disease status post non-STEMI in May 2013 followed by CABG, diastolic heart failure, breast cancer status post bilateral mastectomies, diabetes, sleep apnea, COPD, carotid artery disease, obesity.  Nuclear stress test in May 2016 demonstrated a small area of inferolateral ischemia.  This is felt to be low risk and she was treated medically.  She was last seen in clinic by Dr. Harrell Gave End December 2018.  She presented with complaints of worsening dyspnea and lower extremity edema.  She had recently been on a trip to [place].  D-dimer was negative for pulmonary embolism.  Echocardiogram demonstrated normal LV function with mild aortic stenosis.  It was felt that if her breathing did not improve, she would likely need left heart catheterization.  Jamie [name] returns for follow-up.  She is here alone.  Since last seen, she has been treated for gout and bronchitis.  She had 2 rounds of antibiotics and developed diarrhea.  She continues to feel short of breath with minimal activity.  She had some discomfort in her chest one night while lying in her recliner.  She did not take nitroglycerin.  She did not have any associated symptoms.  Overall, her breathing is worse some days and better other days.  She denies PND.  Her edema is improved.  She denies syncope.  Prior CV studies:   The following studies were reviewed today:  Echo 07/24/17 Mild concentric LVH, EF 40-98, normal diastolic function, mild aortic stenosis (mean 14, peak 31; LVOT/AV 0.46), MAC, mild LAE  Carotid US 07/11/17 Bilateral ICA 1-39 >> Follow-up 2 years  Chest CT  05/20/17 IMPRESSION: 1. Stable chest CT without evidence of local recurrence or metastatic disease. 2. Study is limited by artifact from the external breast prostheses which should be removed on subsequent CT imaging. 3. Hepatic steatosis and cholelithiasis. 4.  Aortic Atherosclerosis (ICD10-I70.0).  Carotid US 11/91:  RICA 47-82%; LICA 95-62%, normal subclavians >> FU 1 year  Myoview 12/14/14 Myocardial perfusion is abnormal. Findings consistent with basal inferolateral ischemia. This is a low risk study. Overall left ventricular systolic function was normal. LV cavity size is normal. The left ventricular ejection fraction is hyperdynamic (>65%). There is no prior study for comparison.  Echo 12/13/14 EF 60-65%, Normal wall motion, grade 1 diastolic dysfunction, aortic sclerosis, mild aortic stenosis, mean gradient 14 mmHg, MAC   Echo 02/04/13 - EF 60% to 65%. Wall motion was normal;  - Aortic valve: Valve area: 3.07cm^2(VTI). Valve area: 3.14cm^2 (Vmax).   Past Medical History:  Diagnosis Date  . Anxiety   . Asthma   . Breast cancer ([place]) 08/2011   lt. breast ca  . CAD (coronary artery disease)    NSTEMI 5/13:  LHC demonstrated oLM 75%, pLAD 80%, RCA occluded, filled by collaterals from the LAD.  Echo 12/10/11: EF 60%.; CABG 12/13/11: LIMA-LAD, SVG-RCA, SVG-diagonal and OM.   Marland Kitchen Carotid artery disease ([place])    a.  Pre-CABG Dopplers 5/13: Bilateral 40-59%; b.  Carotid US 1/30: RICA 86-57%, LICA 84-69%, normal subclavians bilaterally >> FU 6 months;  c. Carotid US 82/70: RICA 78-67%; LICA 54-49%, normal subclavians >> FU 1 year // d. Carotid US 11/17:  Stable 40-59% bilateral ICA stenosis; F/U 1 year.  Marland Kitchen COPD (chronic obstructive pulmonary disease) ([place])   . DM2 (diabetes mellitus, type 2) (HCC)    metformin  . HLD (hyperlipidemia)   . Hx of cardiovascular stress test    Myoview 5/16:  small area of inf-lat ischemia, EF 70%; Low Risk >> try medical Rx  . Hx of echocardiogram     Echo 5/16:  EF 60-65%, no RWMA, Gr 1 DD, Ao sclerosis, mild AS, mean 14 mmHg, MAC  . Hx of migraines   . Hypertension   . Myocardial infarction ([place])   . Obesity   . Shortness of breath   . Sleep apnea    sleep study2-3 yrs ago lost weight afterward but gained back  . UTI (lower urinary tract infection)    freq-bladder implant -removed    Past Surgical History:  Procedure Laterality Date  . ABDOMINAL HYSTERECTOMY  1977  . APPENDECTOMY    . AXILLARY LYMPH NODE DISSECTION  10/18/2011   Procedure: AXILLARY LYMPH NODE DISSECTION;  Surgeon: Merrie Roof, MD;  Location: [place];  Service: General;  Laterality: Left;  . BREAST RECONSTRUCTION  10/18/2011   Procedure: BREAST RECONSTRUCTION;  Surgeon: Theodoro Kos, DO;  Location: [place];  Service: Plastics;  Laterality: Bilateral;   bilateral breast reconstruction with bilateral tissue expander and placement of flex hd.  . CESAREAN SECTION  04/1976  . CORONARY ARTERY BYPASS GRAFT  12/13/2011   Procedure: CORONARY ARTERY BYPASS GRAFTING (CABG);  Surgeon: Gaye Pollack, MD;  Location: [place];  Service: Open Heart Surgery;  Laterality: N/A;  Times four using endoscopically harvested left greater saphenous vein and left internal mammary artery. Right greater saphenous vein attempted; not appropriate for vein harvest.  . LAPAROSCOPIC GASTRIC BANDING  2010  . LEFT HEART CATHETERIZATION WITH CORONARY ANGIOGRAM N/A 12/11/2011   Procedure: LEFT HEART CATHETERIZATION WITH CORONARY ANGIOGRAM;  Surgeon: Larey Dresser, MD;  [facility] C[facility];  Service: Cardiovascular;  Laterality: N/A;  . LESION REMOVAL  04/09/2012   Procedure: LESION REMOVAL;  Surgeon: Theodoro Kos, DO;  Location: [place];  Service: Plastics;  Laterality: Bilateral;  . MASTECTOMY W/ SENTINEL NODE BIOPSY  10/18/2011   Procedure: MASTECTOMY WITH SENTINEL LYMPH NODE BIOPSY;  Surgeon: Merrie Roof, MD;  Location: [place];  Service: General;  Laterality: Bilateral;  bilateral  mastectomy and left sentinel node biopsy  . NASAL SINUS SURGERY    . PORT-A-CATH REMOVAL  04/09/2012   Procedure: REMOVAL PORT-A-CATH;  Surgeon: Merrie Roof, MD;  Location: [place];  Service: General;  Laterality: Right;  . PORTACATH PLACEMENT  12/02/2011   Procedure: INSERTION PORT-A-CATH;  Surgeon: Merrie Roof, MD;  Location: [place];  Service: General;  Laterality: Right;  . TONSILLECTOMY    . US ECHOCARDIOGRAPHY     at [place] NAVIGATION Left 11/26/2013   Procedure: VIDEO BRONCHOSCOPY WITH ENDOBRONCHIAL NAVIGATION;  Surgeon: Collene Gobble, MD;  Location: [facility];  Service: Thoracic;  Laterality: Left;    Current Medications: Current Meds  Medication Sig  . amLODipine (NORVASC) 5 MG tablet TAKE 1 TABLET BY MOUTH ONCE DAILY -PLEASE  MAKE  OVERDUE  YEARLY  APPOINTMENT  WITH  DR.  END  BEFORE  ANYMORE  REFILLS.  [phone].  Marland Kitchen aspirin  EC 81 MG tablet Take 1 tablet (81 mg total) by mouth daily.  . atorvastatin (LIPITOR) 80 MG tablet Take 1 tablet (80 mg total) by mouth daily at 6 PM. Please make overdue yearly appt with Dr. End before anymore refills. 2nd attempt  . buPROPion (WELLBUTRIN XL) 150 MG 24 hr tablet Take 1 tablet by mouth daily.  . cephALEXin (KEFLEX) 500 MG capsule Take 1 capsule by mouth daily. abx for bladder issues  . fluticasone (FLONASE) 50 MCG/ACT nasal spray Place 2 sprays into both nostrils daily.   . furosemide (LASIX) 40 MG tablet Take 1 tablet (40 mg total) by mouth daily.  . lisinopril (PRINIVIL,ZESTRIL) 10 MG tablet TAKE 1 TABLET BY MOUTH ONCE DAILY(NEEDS TO CALL OFFICE FOR APPT)  . loratadine (CLARITIN) 10 MG tablet Take 10 mg by mouth daily.  . metFORMIN (GLUCOPHAGE) 500 MG tablet Take 1 tablet by mouth 2 (two) times daily.  . metoprolol tartrate (LOPRESSOR) 50 MG tablet TAKE 1 TABLET BY MOUTH TWICE DAILY(PLEASE MAKE OVERDUE YEARLY APPT WITH DR BEFORE ANY MORE REFILLS 2ND ATTEMPT)  . mirabegron  ER (MYRBETRIQ) 50 MG TB24 tablet Take 50 mg by mouth.  . Multiple Vitamin (MULTI VITAMIN DAILY PO) Take 1 tablet by mouth daily.  . nitroGLYCERIN (NITROSTAT) 0.4 MG SL tablet Place 1 tablet (0.4 mg total) under the tongue every 5 (five) minutes as needed.     Allergies:   Hydrocodone-acetaminophen; Vicodin [hydrocodone-acetaminophen]; Sulfa antibiotics; Escitalopram; and Sulfamethoxazole   Social History   Tobacco Use  . Smoking status: Former Smoker    Packs/day: 2.00    Years: 40.00    Pack years: 80.00    Types: Cigarettes    Last attempt to quit: 08/25/2006    Years since quitting: 10.9  . Smokeless tobacco: Never Used  Substance Use Topics  . Alcohol use: Yes    Comment: 1-2 times a year  . Drug use: No     Family Hx: The patient's family history includes Breast cancer in her maternal aunt and maternal aunt; Heart disease in her father and mother; Ovarian cancer in her paternal aunt. There is no history of Anesthesia problems, Hypotension, Malignant hyperthermia, or Pseudochol deficiency.  ROS:   Please see the history of present illness.    Review of Systems  Respiratory: Positive for cough and wheezing.   Gastrointestinal: Positive for constipation, diarrhea and nausea.   All other systems reviewed and are negative.   EKGs/Labs/Other Test Reviewed:    EKG:  EKG is not ordered today.    Recent Labs: 05/20/2017: ALT 28 07/17/2017: Hemoglobin 12.9; Platelets 207 07/24/2017: BUN 21; Creatinine, Ser 0.99; Potassium 3.9; Sodium 140   Recent Lipid Panel Lab Results  Component Value Date/Time   CHOL 126 03/10/2012 10:15 AM   TRIG 182.0 (H) 03/10/2012 10:15 AM   HDL 41.10 03/10/2012 10:15 AM   CHOLHDL 3 03/10/2012 10:15 AM   LDLCALC 49 03/10/2012 10:15 AM    Physical Exam:    VS:  BP 128/62   Pulse 78   Ht 5' 5" (1.651 m)   Wt 248 lb 6.4 oz (112.7 kg)   SpO2 98%   BMI 41.34 kg/m     Wt Readings from Last 3 Encounters:  08/13/17 248 lb 6.4 oz (112.7 kg)    07/17/17 255 lb 3.2 oz (115.8 kg)  02/18/17 251 lb (113.9 kg)     Physical Exam  Constitutional: She is oriented to person, place, and time. She appears well-developed and well-nourished. No   distress.  HENT:  Head: Normocephalic and atraumatic.  Neck: No JVD (at 90 degrees) present.  Cardiovascular: Normal rate and regular rhythm.  No murmur heard. Pulmonary/Chest: Effort normal. She has no wheezes. She has no rales.  Abdominal: Soft.  Musculoskeletal: She exhibits no edema.  Neurological: She is alert and oriented to person, place, and time.  Skin: Skin is warm and dry.    ASSESSMENT:    1. Shortness of breath   2. Coronary artery disease involving native coronary artery of native heart with angina pectoris ([place])   3. Chronic diastolic heart failure ([place])   4. Nonrheumatic aortic valve stenosis   5. Essential hypertension    PLAN:    In order of problems listed above:  1. Shortness of breath She continues to struggle with dyspnea with minimal activity.  She has not had significant improvement in her breathing with initiation of diuresis.  She had similar symptoms prior to her myocardial infarction in 2013.  I suspect her symptoms are an anginal equivalent.  Dr. Harrell Gave End also felt that she would need cardiac catheterization if her breathing did not improve with diuresis.  I reviewed this with Dr. Saunders Revel by phone today.  We have recommended proceeding with a right and left cardiac catheterization.  Risks and benefits of cardiac catheterization have been discussed with the patient.  These include bleeding, infection, kidney damage, stroke, heart attack, death.  The patient understands these risks and is willing to proceed.   2. Coronary artery disease Status post myocardial infarction in 2013 followed by CABG.  Low risk stress test in 2016.  Recently, she has had worsening dyspnea with exertion concerning for anginal equivalent.  As noted, she will be set up for right and left  heart catheterization.  Continue amlodipine, aspirin, statin, ACE inhibitor, beta-blocker.  She knows to go to the emergency room if she has any worsening symptoms prior to her planned procedure.  -R/L heart catheterization with Dr. Harrell Gave End  08/22/17  3. Chronic diastolic heart failure (HCC) Volume status appears stable.  Continue current therapy.  Basic metabolic panel will be obtained today as part of her pre-cardiac catheterization labs.  4. Nonrheumatic aortic valve stenosis Mild aortic stenosis by recent echocardiogram.  5. Essential hypertension The patient's blood pressure is controlled on her current regimen.  Continue current therapy.    Dispo:  Return in about 2 weeks (around 08/27/2017) for Post Procedure Follow Up, w/ Dr. Saunders Revel, or Richardson Dopp, PA-C.   Medication Adjustments/Labs and Tests Ordered: Current medicines are reviewed at length with the patient today.  Concerns regarding medicines are outlined above.  Tests Ordered: Orders Placed This Encounter  Procedures  . Basic metabolic panel  . CBC  . INR/PT   Medication Changes: No orders of the defined types were placed in this encounter.   Signed, Richardson Dopp, PA-C  08/13/2017 12:08 PM    [facility] [address], [place], [place]  [address] Phone: [phone]; Fax: [phone]

## 2017-08-13 NOTE — Addendum Note (Signed)
Addended by: Gaetano Net on: 08/13/2017 12:10 PM   Modules accepted: Orders

## 2017-08-14 ENCOUNTER — Telehealth: Payer: Self-pay

## 2017-08-14 LAB — BASIC METABOLIC PANEL
BUN/Creatinine Ratio: 19 (ref 12–28)
BUN: 21 mg/dL (ref 8–27)
CHLORIDE: 99 mmol/L (ref 96–106)
CO2: 23 mmol/L (ref 20–29)
Calcium: 10 mg/dL (ref 8.7–10.3)
Creatinine, Ser: 1.12 mg/dL — ABNORMAL HIGH (ref 0.57–1.00)
GFR calc Af Amer: 57 mL/min/{1.73_m2} — ABNORMAL LOW (ref >59)
GFR calc non Af Amer: 49 mL/min/{1.73_m2} — ABNORMAL LOW (ref >59)
GLUCOSE: 119 mg/dL — AB (ref 65–99)
Potassium: 3.6 mmol/L (ref 3.5–5.2)
SODIUM: 141 mmol/L (ref 134–144)

## 2017-08-14 LAB — PROTIME-INR
INR: 1.1 (ref 0.8–1.2)
PROTHROMBIN TIME: 11.2 s (ref 9.1–12.0)

## 2017-08-14 LAB — CBC
HEMATOCRIT: 40.2 % (ref 34.0–46.6)
Hemoglobin: 12.9 g/dL (ref 11.1–15.9)
MCH: 27.6 pg (ref 26.6–33.0)
MCHC: 32.1 g/dL (ref 31.5–35.7)
MCV: 86 fL (ref 79–97)
Platelets: 217 10*3/uL (ref 150–379)
RBC: 4.67 x10E6/uL (ref 3.77–5.28)
RDW: 13.9 % (ref 12.3–15.4)
WBC: 6.5 10*3/uL (ref 3.4–10.8)

## 2017-08-14 MED ORDER — FUROSEMIDE 40 MG PO TABS: 20.0000 mg | ORAL_TABLET | Freq: Every day | ORAL | 3 refills | Status: DC

## 2017-08-14 NOTE — Telephone Encounter (Signed)
-----   Message from Liliane Shi, [place] sent at 08/14/2017 10:44 AM EST ----- Please call patient. Creatinine somewhat increased.  Glucose elevated.  Hemoglobin, INR normal. Decrease Lasix to 20 mg daily.  Hold Lasix and lisinopril for 24 hours prior to cardiac catheterization. Otherwise, Continue current medications and follow up as planned. Richardson Dopp, PA-C 08/14/2017 10:44 AM

## 2017-08-14 NOTE — Telephone Encounter (Signed)
Informed patient of results and verbal understanding expressed.  Instructed patient to DECREASE LASIX to 20 mg daily. She will hold Lasix and Lisinopril the day of and day before her catheterization. She was grateful for call and agrees with treatment plan.  She has plenty of Lasix now. She will have the medication refilled at the correct dosage at next visit.

## 2017-08-20 ENCOUNTER — Ambulatory Visit: Payer: Medicare Other | Admitting: Physician Assistant

## 2017-08-21 ENCOUNTER — Telehealth: Payer: Self-pay

## 2017-08-21 NOTE — Telephone Encounter (Signed)
Patient contacted pre-catheterization at [facility] scheduled for:  08/22/2017 @ 0730 Verified arrival time and place:  [facility] @ 0530 Confirmed AM meds to be taken pre-cath with sip of water: Take ASA Hold metformin, lisinopril and lasix starting 08/21/2017 and am of procedure Confirmed patient has responsible person to drive home post procedure and observe patient for 24 hours:  yes Addl concerns:  none

## 2017-08-22 ENCOUNTER — Encounter (HOSPITAL_COMMUNITY)
Admission: RE | Disposition: A | Discharge status: Home / Self Care | Payer: Self-pay | Source: Ambulatory Visit | Attending: Internal Medicine

## 2017-08-22 ENCOUNTER — Ambulatory Visit (HOSPITAL_COMMUNITY)
Admission: RE | Admit: 2017-08-22 | Discharge: 2017-08-22 | Disposition: A | Discharge status: Home / Self Care | Payer: Medicare Other | Source: Ambulatory Visit | Attending: Internal Medicine | Admitting: Internal Medicine

## 2017-08-22 DIAGNOSIS — E669 Obesity, unspecified: Secondary | ICD-10-CM | POA: Insufficient documentation

## 2017-08-22 DIAGNOSIS — I5032 Chronic diastolic (congestive) heart failure: Secondary | ICD-10-CM | POA: Diagnosis present

## 2017-08-22 DIAGNOSIS — Z7984 Long term (current) use of oral hypoglycemic drugs: Secondary | ICD-10-CM | POA: Diagnosis not present

## 2017-08-22 DIAGNOSIS — Z7982 Long term (current) use of aspirin: Secondary | ICD-10-CM | POA: Insufficient documentation

## 2017-08-22 DIAGNOSIS — E785 Hyperlipidemia, unspecified: Secondary | ICD-10-CM | POA: Insufficient documentation

## 2017-08-22 DIAGNOSIS — E119 Type 2 diabetes mellitus without complications: Secondary | ICD-10-CM | POA: Diagnosis not present

## 2017-08-22 DIAGNOSIS — G4730 Sleep apnea, unspecified: Secondary | ICD-10-CM | POA: Insufficient documentation

## 2017-08-22 DIAGNOSIS — Z87891 Personal history of nicotine dependence: Secondary | ICD-10-CM | POA: Insufficient documentation

## 2017-08-22 DIAGNOSIS — I25119 Atherosclerotic heart disease of native coronary artery with unspecified angina pectoris: Secondary | ICD-10-CM | POA: Diagnosis not present

## 2017-08-22 DIAGNOSIS — Z8249 Family history of ischemic heart disease and other diseases of the circulatory system: Secondary | ICD-10-CM | POA: Diagnosis not present

## 2017-08-22 DIAGNOSIS — J449 Chronic obstructive pulmonary disease, unspecified: Secondary | ICD-10-CM | POA: Insufficient documentation

## 2017-08-22 DIAGNOSIS — I252 Old myocardial infarction: Secondary | ICD-10-CM | POA: Diagnosis not present

## 2017-08-22 DIAGNOSIS — Z951 Presence of aortocoronary bypass graft: Secondary | ICD-10-CM | POA: Insufficient documentation

## 2017-08-22 DIAGNOSIS — I350 Nonrheumatic aortic (valve) stenosis: Secondary | ICD-10-CM | POA: Insufficient documentation

## 2017-08-22 DIAGNOSIS — R0602 Shortness of breath: Secondary | ICD-10-CM | POA: Diagnosis present

## 2017-08-22 DIAGNOSIS — I25118 Atherosclerotic heart disease of native coronary artery with other forms of angina pectoris: Secondary | ICD-10-CM | POA: Diagnosis present

## 2017-08-22 DIAGNOSIS — Z853 Personal history of malignant neoplasm of breast: Secondary | ICD-10-CM | POA: Insufficient documentation

## 2017-08-22 DIAGNOSIS — Z6841 Body Mass Index (BMI) 40.0 and over, adult: Secondary | ICD-10-CM | POA: Diagnosis not present

## 2017-08-22 DIAGNOSIS — I110 Hypertensive heart disease with heart failure: Secondary | ICD-10-CM | POA: Diagnosis not present

## 2017-08-22 DIAGNOSIS — Z9013 Acquired absence of bilateral breasts and nipples: Secondary | ICD-10-CM | POA: Insufficient documentation

## 2017-08-22 DIAGNOSIS — Z79899 Other long term (current) drug therapy: Secondary | ICD-10-CM | POA: Diagnosis not present

## 2017-08-22 DIAGNOSIS — I5033 Acute on chronic diastolic (congestive) heart failure: Secondary | ICD-10-CM | POA: Diagnosis present

## 2017-08-22 DIAGNOSIS — I700 Atherosclerosis of aorta: Secondary | ICD-10-CM | POA: Insufficient documentation

## 2017-08-22 DIAGNOSIS — Z7951 Long term (current) use of inhaled steroids: Secondary | ICD-10-CM | POA: Diagnosis not present

## 2017-08-22 HISTORY — PX: INTRAVASCULAR PRESSURE WIRE/FFR STUDY: CATH118243

## 2017-08-22 HISTORY — PX: RIGHT/LEFT HEART CATH AND CORONARY/GRAFT ANGIOGRAPHY: CATH118267

## 2017-08-22 HISTORY — PX: CORONARY PRESSURE/FFR STUDY: CATH118243

## 2017-08-22 LAB — POCT I-STAT 3, VENOUS BLOOD GAS (G3P V)
ACID-BASE DEFICIT: 1 mmol/L (ref 0.0–2.0)
ACID-BASE DEFICIT: 1 mmol/L (ref 0.0–2.0)
Bicarbonate: 25.4 mmol/L (ref 20.0–28.0)
Bicarbonate: 25 mmol/L (ref 20.0–28.0)
O2 Saturation: 72 %
O2 Saturation: 72 %
PH VEN: 7.334 (ref 7.250–7.430)
TCO2: 27 mmol/L (ref 22–32)
TCO2: 26 mmol/L (ref 22–32)
pCO2, Ven: 47.1 mmHg (ref 44.0–60.0)
pCO2, Ven: 46.9 mmHg (ref 44.0–60.0)
pH, Ven: 7.34 (ref 7.250–7.430)
pO2, Ven: 41 mmHg (ref 32.0–45.0)
pO2, Ven: 41 mmHg (ref 32.0–45.0)

## 2017-08-22 LAB — POCT I-STAT 3, ART BLOOD GAS (G3+)
ACID-BASE DEFICIT: 1 mmol/L (ref 0.0–2.0)
Bicarbonate: 24.4 mmol/L (ref 20.0–28.0)
O2 Saturation: 96 %
PCO2 ART: 41.9 mmHg (ref 32.0–48.0)
PO2 ART: 83 mmHg (ref 83.0–108.0)
TCO2: 26 mmol/L (ref 22–32)
pH, Arterial: 7.374 (ref 7.350–7.450)

## 2017-08-22 LAB — GLUCOSE, CAPILLARY
Glucose-Capillary: 103 mg/dL — ABNORMAL HIGH (ref 65–99)
Glucose-Capillary: 113 mg/dL — ABNORMAL HIGH (ref 65–99)

## 2017-08-22 LAB — POCT ACTIVATED CLOTTING TIME: ACTIVATED CLOTTING TIME: 301 s

## 2017-08-22 SURGERY — RIGHT/LEFT HEART CATH AND CORONARY/GRAFT ANGIOGRAPHY
Anesthesia: LOCAL

## 2017-08-22 MED ORDER — SODIUM CHLORIDE 0.9 % WEIGHT BASED INFUSION: 1.0000 mL/kg/h | INTRAVENOUS | Status: DC

## 2017-08-22 MED ORDER — SODIUM CHLORIDE 0.9 % IV SOLN: INTRAVENOUS | Status: DC

## 2017-08-22 MED ORDER — VERAPAMIL HCL 2.5 MG/ML IV SOLN
INTRAVENOUS | Status: DC | PRN
  Administered 2017-08-22: 10 mL via INTRA_ARTERIAL

## 2017-08-22 MED ORDER — METFORMIN HCL 500 MG PO TABS: 500.0000 mg | ORAL_TABLET | Freq: Two times a day (BID) | ORAL | Status: DC

## 2017-08-22 MED ORDER — IOPAMIDOL (ISOVUE-370) INJECTION 76%
INTRAVENOUS | Status: AC
  Filled 2017-08-22: qty 125

## 2017-08-22 MED ORDER — VERAPAMIL HCL 2.5 MG/ML IV SOLN
INTRAVENOUS | Status: AC
  Filled 2017-08-22: qty 2

## 2017-08-22 MED ORDER — HEPARIN SODIUM (PORCINE) 1000 UNIT/ML IJ SOLN
INTRAMUSCULAR | Status: DC | PRN
  Administered 2017-08-22 (×2): 5000 [IU] via INTRAVENOUS

## 2017-08-22 MED ORDER — IOPAMIDOL (ISOVUE-370) INJECTION 76%
INTRAVENOUS | Status: AC
  Filled 2017-08-22: qty 50

## 2017-08-22 MED ORDER — HEPARIN (PORCINE) IN NACL 2-0.9 UNIT/ML-% IJ SOLN
INTRAMUSCULAR | Status: AC
  Filled 2017-08-22: qty 1000

## 2017-08-22 MED ORDER — MIDAZOLAM HCL 2 MG/2ML IJ SOLN
INTRAMUSCULAR | Status: AC
  Filled 2017-08-22: qty 2

## 2017-08-22 MED ORDER — ASPIRIN 81 MG PO CHEW: 81.0000 mg | CHEWABLE_TABLET | ORAL | Status: DC

## 2017-08-22 MED ORDER — HEPARIN (PORCINE) IN NACL 2-0.9 UNIT/ML-% IJ SOLN
INTRAMUSCULAR | Status: AC | PRN
  Administered 2017-08-22: 1500 mL

## 2017-08-22 MED ORDER — FENTANYL CITRATE (PF) 100 MCG/2ML IJ SOLN
INTRAMUSCULAR | Status: DC | PRN
  Administered 2017-08-22 (×2): 25 ug via INTRAVENOUS

## 2017-08-22 MED ORDER — SODIUM CHLORIDE 0.9 % IV SOLN: 250.0000 mL | INTRAVENOUS | Status: DC | PRN

## 2017-08-22 MED ORDER — HYDRALAZINE HCL 20 MG/ML IJ SOLN: 5.0000 mg | INTRAMUSCULAR | Status: DC | PRN

## 2017-08-22 MED ORDER — FENTANYL CITRATE (PF) 100 MCG/2ML IJ SOLN
INTRAMUSCULAR | Status: AC
  Filled 2017-08-22: qty 2

## 2017-08-22 MED ORDER — SODIUM CHLORIDE 0.9% FLUSH: 3.0000 mL | INTRAVENOUS | Status: DC | PRN

## 2017-08-22 MED ORDER — MIDAZOLAM HCL 2 MG/2ML IJ SOLN
INTRAMUSCULAR | Status: DC | PRN
Start: 2017-08-22 — End: 2017-08-22
  Administered 2017-08-22: 1 mg via INTRAVENOUS
  Administered 2017-08-22 (×2): 0.5 mg via INTRAVENOUS

## 2017-08-22 MED ORDER — FENTANYL CITRATE (PF) 100 MCG/2ML IJ SOLN
INTRAMUSCULAR | Status: DC | PRN
  Administered 2017-08-22: 25 ug via INTRAVENOUS

## 2017-08-22 MED ORDER — SODIUM CHLORIDE 0.9% FLUSH: 3.0000 mL | Freq: Two times a day (BID) | INTRAVENOUS | Status: DC

## 2017-08-22 MED ORDER — ADENOSINE (DIAGNOSTIC) 140MCG/KG/MIN
INTRAVENOUS | Status: AC | PRN
  Administered 2017-08-22: 140 ug/kg/min via INTRAVENOUS

## 2017-08-22 MED ORDER — NITROGLYCERIN 1 MG/10 ML FOR IR/CATH LAB
INTRA_ARTERIAL | Status: DC | PRN
  Administered 2017-08-22 (×2): 200 ug via INTRACORONARY

## 2017-08-22 MED ORDER — IOPAMIDOL (ISOVUE-370) INJECTION 76%
INTRAVENOUS | Status: DC | PRN
  Administered 2017-08-22: 155 mL via INTRA_ARTERIAL

## 2017-08-22 MED ORDER — LABETALOL HCL 5 MG/ML IV SOLN: 10.0000 mg | INTRAVENOUS | Status: DC | PRN

## 2017-08-22 MED ORDER — HEPARIN SODIUM (PORCINE) 1000 UNIT/ML IJ SOLN
INTRAMUSCULAR | Status: AC
  Filled 2017-08-22: qty 1

## 2017-08-22 MED ORDER — LIDOCAINE HCL (PF) 1 % IJ SOLN
INTRAMUSCULAR | Status: AC
  Filled 2017-08-22: qty 30

## 2017-08-22 MED ORDER — SODIUM CHLORIDE 0.9 % WEIGHT BASED INFUSION
3.0000 mL/kg/h | INTRAVENOUS | Status: AC
  Administered 2017-08-22: 3 mL/kg/h via INTRAVENOUS

## 2017-08-22 MED ORDER — LIDOCAINE HCL (PF) 1 % IJ SOLN
INTRAMUSCULAR | Status: DC | PRN
  Administered 2017-08-22 (×2): 2 mL via SUBCUTANEOUS

## 2017-08-22 SURGICAL SUPPLY — 22 items
CATH BALLN WEDGE 5F 110CM (CATHETERS) ×2 IMPLANT
CATH INFINITI 5 FR IM (CATHETERS) ×2 IMPLANT
CATH INFINITI 5 FR JL3.5 (CATHETERS) ×2 IMPLANT
CATH INFINITI 5 FR MPA2 (CATHETERS) ×2 IMPLANT
CATH INFINITI 5FR MULTPACK ANG (CATHETERS) ×2 IMPLANT
CATH MICROCATH NAVVUS (MICROCATHETER) ×1 IMPLANT
CATH VISTA GUIDE 6FR JL3.5 SH (CATHETERS) ×2 IMPLANT
COVER PRB 48X5XTLSCP FOLD TPE (BAG) ×1 IMPLANT
COVER PROBE 5X48 (BAG) ×2
DEVICE RAD COMP TR BAND LRG (VASCULAR PRODUCTS) ×2 IMPLANT
GLIDESHEATH SLEND SS 6F .021 (SHEATH) ×2 IMPLANT
GUIDEWIRE .025 260CM (WIRE) ×2 IMPLANT
GUIDEWIRE INQWIRE 1.5J.035X260 (WIRE) ×1 IMPLANT
INQWIRE 1.5J .035X260CM (WIRE) ×2
KIT ESSENTIALS PG (KITS) ×2 IMPLANT
KIT HEART LEFT (KITS) ×2 IMPLANT
MICROCATHETER NAVVUS (MICROCATHETER) ×2
PACK CARDIAC CATHETERIZATION (CUSTOM PROCEDURE TRAY) ×2 IMPLANT
SHEATH GLIDE SLENDER 4/5FR (SHEATH) ×2 IMPLANT
TRANSDUCER W/STOPCOCK (MISCELLANEOUS) ×2 IMPLANT
TUBING CIL FLEX 10 FLL-RA (TUBING) ×2 IMPLANT
WIRE RUNTHROUGH .014X180CM (WIRE) ×4 IMPLANT

## 2017-08-22 NOTE — Discharge Instructions (Signed)

## 2017-08-22 NOTE — Brief Op Note (Signed)
Brief Cardiac Catheterization Note  Date: 08/22/2017 Time: 9:23 AM  PATIENT:  Jamie [name]  72 y.o. female  PRE-OPERATIVE DIAGNOSIS:  cad, sob  POST-OPERATIVE DIAGNOSIS:  same  PROCEDURE:  Procedure(s): RIGHT/LEFT HEART CATH AND CORONARY/GRAFT ANGIOGRAPHY (N/A) INTRAVASCULAR PRESSURE WIRE/FFR STUDY (N/A)  SURGEON:  Surgeon(s) and Role:    * Merric Yost, MD - Primary  FINDINGS: 1. 70% ostial LMCA stenosis (FFR 0.82), 40% mid LAD stenosis, and CTO of mid RCA. 2. Atretic LIMA->LAD, Patent SVG->D with occlusion of jump portion to OM, and patent SVG->RPDA. 3. Mildly elevated left and right heart filling pressures. 4. Normal Fick cardiac output.  RECOMMENDATIONS: 1. Medical therapy, including addition of long-acting nitrate and/or ranolazine. 2. Aggressive secondary prevention. 3. If symptoms are refractory to medical therapy and weight loss, high-risk PCI to ostial LMCA could be considered.  Nelva Bush, MD [facility] Pager: [phone]

## 2017-08-22 NOTE — Progress Notes (Signed)
R brachial sheath pulled, pressure held for 12 minutes. Level 0. Dsg clean, dry, intact

## 2017-08-22 NOTE — Interval H&P Note (Signed)
History and Physical Interval Note:  08/22/2017 7:01 AM  Jamie [name]  has presented today for cardiac catheterization, with the diagnosis of shortness of breath, chronic diastolic heart failure, and coronary artery disease. The various methods of treatment have been discussed with the patient and family. After consideration of risks, benefits and other options for treatment, the patient has consented to  Procedure(s): RIGHT/LEFT HEART CATH AND CORONARY/GRAFT ANGIOGRAPHY (N/A) as a surgical intervention .  The patient's history has been reviewed, patient examined, no change in status, stable for surgery.  I have reviewed the patient's chart and labs.  Questions were answered to the patient's satisfaction.    Cath Lab Visit (complete for each Cath Lab visit)  Clinical Evaluation Leading to the Procedure:   ACS: No.  Non-ACS:    Anginal Classification: No angina; NYHA class III  Anti-ischemic medical therapy: Maximal Therapy (2 or more classes of medications)  Non-Invasive Test Results: No non-invasive testing performed  Prior CABG: Previous CABG  Jamie [name]

## 2017-08-25 ENCOUNTER — Encounter (HOSPITAL_COMMUNITY): Payer: Self-pay | Admitting: Internal Medicine

## 2017-08-27 ENCOUNTER — Ambulatory Visit: Payer: Medicare Other | Admitting: Physician Assistant

## 2017-09-04 NOTE — Progress Notes (Signed)
Cardiology Office Note:    Date:  09/05/2017   ID:  Jamie [name], DOB 19-Mar-1946, MRN [id number]  PCP:  Verdell Carmine., MD  Cardiologist:  Nelva Bush, MD   Referring MD: Verdell Carmine., MD   Chief Complaint  Patient presents with  . Hospitalization Follow-up    s/p cardiac cath    History of Present Illness:    Jamie [name] is a 72 y.o. female with a hx of coronary artery disease status post non-STEMI in May 2013 followed by CABG, diastolic heart failure, breast cancer status post bilateral mastectomies, diabetes, sleep apnea, COPD, carotid artery disease, obesity.  Nuclear stress test in May 2016 demonstrated a small area of inferolateral ischemia.  This was felt to be low risk and she was treated medically.  She was seen in clinic in December 2018 with worsening shortness of breath and lower extremity edema.  In follow-up 08/13/17, she noted dyspnea with minimal activity as well as chest discomfort.  I therefore set her up for cardiac catheterization.  This demonstrated an atretic LIMA-LAD, patent SVG-D1 with an occluded jump portion to the OM 2, patent SVG-RCA.  There was also 70% ostial left main stenosis which was evaluated by FFR.  This was not felt to be hemodynamically significant.  Aggressive medical therapy was recommended.  Nitrates were added.  Ranolazine could be considered if she was intolerant to isosorbide.  If she has no relief of symptoms, pulmonary consultation could be considered.  If she has continued dyspnea despite maximal antianginal therapy, adequate diuresis, weight loss and pulmonary evaluation, PCI to the left main could be considered.    Jamie [name] returns for follow-up.  She is here today with her husband.  Since the cardiac catheterization, she does feel that her breathing has improved somewhat.  However, she still describes NYHA 2b-3 symptoms.  She sleeps in a recliner for comfort.  She denies PND or edema.  She has not had further chest discomfort.  She  denies syncope.  Prior CV studies:   The following studies were reviewed today:  Cardiac catheterization 08/22/17 Left main ostial 70 LAD proximal 50 RCA mid 100 LIMA-LAD atretic SVG-D1 patent with occluded jump portion to OM2 SVG-RCA patent FFR of ostial left main 0.82 (not hemodynamically significant) Mean RA 8, PCWP 23, LVEDP 23, peak to peak AV gradient 15  Echo 07/24/17 Mild concentric LVH, EF 66-06, normal diastolic function, mild aortic stenosis (mean 14, peak 31; LVOT/AV 0.46), MAC, mild LAE  Carotid US 07/11/17 Bilateral ICA 1-39 >> Follow-up 2 years  Chest CT 05/20/17 IMPRESSION: 1. Stable chest CT without evidence of local recurrence or metastatic disease. 2. Study is limited by artifact from the external breast prostheses which should be removed on subsequent CT imaging. 3. Hepatic steatosis and cholelithiasis. 4. Aortic Atherosclerosis (ICD10-I70.0).  Carotid US 30/16:  RICA 01-09%; LICA 32-35%, normal subclavians >> FU 1 year  Myoview 12/14/14 Myocardial perfusion is abnormal. Findings consistent with basal inferolateral ischemia. This is a low risk study. Overall left ventricular systolic function was normal. LV cavity size is normal. The left ventricular ejection fraction is hyperdynamic (>65%). There is no prior study for comparison.  Echo 12/13/14 EF 60-65%,Normal wall motion, grade 1 diastolic dysfunction, aortic sclerosis, mild aortic stenosis, mean gradient 14 mmHg, MAC  Echo 02/04/13 - EF 60% to 65%. Wall motion was normal;  - Aortic valve: Valve area: 3.07cm^2(VTI). Valve area: 3.14cm^2 (Vmax).  Past Medical History:  Diagnosis Date  . Anxiety   .  Asthma   . Breast cancer ([place]) 08/2011   lt. breast ca  . CAD (coronary artery disease)    NSTEMI 5/13:  LHC demonstrated oLM 75%, pLAD 80%, RCA occluded, filled by collaterals from the LAD.  Echo 12/10/11: EF 60%.; CABG 12/13/11: LIMA-LAD, SVG-RCA, SVG-diagonal and OM.   Marland Kitchen Carotid artery disease  ([place])    a.  Pre-CABG Dopplers 5/13: Bilateral 40-59%; b.  Carotid US 3/54: RICA 65-68%, LICA 12-75%, normal subclavians bilaterally >> FU 6 months;  c. Carotid US 17/00: RICA 17-49%; LICA 44-96%, normal subclavians >> FU 1 year // d. Carotid US 11/17:  Stable 40-59% bilateral ICA stenosis; F/U 1 year.  Marland Kitchen COPD (chronic obstructive pulmonary disease) ([place])   . DM2 (diabetes mellitus, type 2) (HCC)    metformin  . HLD (hyperlipidemia)   . Hx of cardiovascular stress test    Myoview 5/16:  small area of inf-lat ischemia, EF 70%; Low Risk >> try medical Rx  . Hx of echocardiogram    Echo 5/16:  EF 60-65%, no RWMA, Gr 1 DD, Ao sclerosis, mild AS, mean 14 mmHg, MAC  . Hx of migraines   . Hypertension   . Myocardial infarction ([place])   . Obesity   . Shortness of breath   . Sleep apnea    sleep study2-3 yrs ago lost weight afterward but gained back  . UTI (lower urinary tract infection)    freq-bladder implant -removed    Past Surgical History:  Procedure Laterality Date  . ABDOMINAL HYSTERECTOMY  1977  . APPENDECTOMY    . AXILLARY LYMPH NODE DISSECTION  10/18/2011   Procedure: AXILLARY LYMPH NODE DISSECTION;  Surgeon: Merrie Roof, MD;  Location: [facility];  Service: General;  Laterality: Left;  . BREAST RECONSTRUCTION  10/18/2011   Procedure: BREAST RECONSTRUCTION;  Surgeon: Theodoro Kos, DO;  Location: [facility];  Service: Plastics;  Laterality: Bilateral;   bilateral breast reconstruction with bilateral tissue expander and placement of flex hd.  . CESAREAN SECTION  04/1976  . CORONARY ARTERY BYPASS GRAFT  12/13/2011   Procedure: CORONARY ARTERY BYPASS GRAFTING (CABG);  Surgeon: Gaye Pollack, MD;  Location: [facility];  Service: Open Heart Surgery;  Laterality: N/A;  Times four using endoscopically harvested left greater saphenous vein and left internal mammary artery. Right greater saphenous vein attempted; not appropriate for vein harvest.  . INTRAVASCULAR PRESSURE WIRE/FFR STUDY N/A 08/22/2017    Procedure: INTRAVASCULAR PRESSURE WIRE/FFR STUDY;  Surgeon: Nelva Bush, MD;  Location: [facility];  Service: Cardiovascular;  Laterality: N/A;  . LAPAROSCOPIC GASTRIC BANDING  2010  . LEFT HEART CATHETERIZATION WITH CORONARY ANGIOGRAM N/A 12/11/2011   Procedure: LEFT HEART CATHETERIZATION WITH CORONARY ANGIOGRAM;  Surgeon: Larey Dresser, MD;  Location: [facility] C[facility];  Service: Cardiovascular;  Laterality: N/A;  . LESION REMOVAL  04/09/2012   Procedure: LESION REMOVAL;  Surgeon: Theodoro Kos, DO;  Location: [facility];  Service: Plastics;  Laterality: Bilateral;  . MASTECTOMY W/ SENTINEL NODE BIOPSY  10/18/2011   Procedure: MASTECTOMY WITH SENTINEL LYMPH NODE BIOPSY;  Surgeon: Merrie Roof, MD;  Location: [facility];  Service: General;  Laterality: Bilateral;  bilateral mastectomy and left sentinel node biopsy  . NASAL SINUS SURGERY    . PORT-A-CATH REMOVAL  04/09/2012   Procedure: REMOVAL PORT-A-CATH;  Surgeon: Merrie Roof, MD;  Location: [facility];  Service: General;  Laterality: Right;  . PORTACATH PLACEMENT  12/02/2011   Procedure: INSERTION  PORT-A-CATH;  Surgeon: Merrie Roof, MD;  Location: [place];  Service: General;  Laterality: Right;  . RIGHT/LEFT HEART CATH AND CORONARY/GRAFT ANGIOGRAPHY N/A 08/22/2017   Procedure: RIGHT/LEFT HEART CATH AND CORONARY/GRAFT ANGIOGRAPHY;  Surgeon: Nelva Bush, MD;  Location: [facility];  Service: Cardiovascular;  Laterality: N/A;  . TONSILLECTOMY    . US ECHOCARDIOGRAPHY     at [place] Left 11/26/2013   Procedure: VIDEO BRONCHOSCOPY WITH ENDOBRONCHIAL NAVIGATION;  Surgeon: Collene Gobble, MD;  Location: [facility];  Service: Thoracic;  Laterality: Left;    Current Medications: Current Meds  Medication Sig  . albuterol (PROVENTIL HFA;VENTOLIN HFA) 108 (90 Base) MCG/ACT inhaler Inhale 2 puffs into the lungs every 6 (six) hours as needed for wheezing  or shortness of breath.  Marland Kitchen amLODipine (NORVASC) 5 MG tablet TAKE 1 TABLET BY MOUTH ONCE DAILY -PLEASE  MAKE  OVERDUE  YEARLY  APPOINTMENT  WITH  DR.  END  BEFORE  ANYMORE  REFILLS.  [phone].  Marland Kitchen aspirin EC 81 MG tablet Take 1 tablet (81 mg total) by mouth daily.  Marland Kitchen atorvastatin (LIPITOR) 80 MG tablet Take 1 tablet (80 mg total) by mouth daily at 6 PM. Please make overdue yearly appt with Dr. Saunders Revel before anymore refills. 2nd attempt  . buPROPion (WELLBUTRIN XL) 300 MG 24 hr tablet Take 300 mg by mouth daily.   . cephALEXin (KEFLEX) 500 MG capsule Take 500 mg by mouth daily. abx for bladder issues   . fluticasone (FLONASE) 50 MCG/ACT nasal spray Place 2 sprays into both nostrils daily.   . furosemide (LASIX) 40 MG tablet Take 0.5 tablets (20 mg total) by mouth daily.  Marland Kitchen lisinopril (PRINIVIL,ZESTRIL) 10 MG tablet TAKE 1 TABLET BY MOUTH ONCE DAILY(NEEDS TO CALL OFFICE FOR APPT)  . metFORMIN (GLUCOPHAGE) 500 MG tablet Take 1 tablet (500 mg total) by mouth 2 (two) times daily.  . metoprolol tartrate (LOPRESSOR) 50 MG tablet TAKE 1 TABLET BY MOUTH TWICE DAILY(PLEASE MAKE OVERDUE YEARLY APPT WITH DR BEFORE ANY MORE REFILLS 2ND ATTEMPT)  . mirabegron ER (MYRBETRIQ) 50 MG TB24 tablet Take 50 mg by mouth daily.   . Multiple Vitamin (MULTI VITAMIN DAILY PO) Take 1 tablet by mouth daily.  . nitroGLYCERIN (NITROSTAT) 0.4 MG SL tablet Place 1 tablet (0.4 mg total) under the tongue every 5 (five) minutes as needed.  Vladimir Faster Glycol-Propyl Glycol (SYSTANE OP) Place 1 drop into both eyes 4 (four) times daily as needed (for dry eyes).     Allergies:   Vicodin [hydrocodone-acetaminophen]; Sulfa antibiotics; Escitalopram; and Sulfamethoxazole   Social History   Tobacco Use  . Smoking status: Former Smoker    Packs/day: 2.00    Years: 40.00    Pack years: 80.00    Types: Cigarettes    Last attempt to quit: 08/25/2006    Years since quitting: 11.0  . Smokeless tobacco: Never Used  Substance Use Topics    . Alcohol use: Yes    Comment: 1-2 times a year  . Drug use: No     Family Hx: The patient's family history includes Breast cancer in her maternal aunt and maternal aunt; Heart disease in her father and mother; Ovarian cancer in her paternal aunt. There is no history of Anesthesia problems, Hypotension, Malignant hyperthermia, or Pseudochol deficiency.  ROS:   Please see the history of present illness.    ROS All other systems reviewed and are negative.   EKGs/Labs/Other  Test Reviewed:    EKG:  EKG is  ordered today.  The ekg ordered today demonstrates NSR, HR 62, normal axis, nonspecific ST-T wave changes, QTC 428, no change from prior tracing  Recent Labs: 05/20/2017: ALT 28 08/13/2017: BUN 21; Creatinine, Ser 1.12; Hemoglobin 12.9; Platelets 217; Potassium 3.6; Sodium 141   Recent Lipid Panel Lab Results  Component Value Date/Time   CHOL 126 03/10/2012 10:15 AM   TRIG 182.0 (H) 03/10/2012 10:15 AM   HDL 41.10 03/10/2012 10:15 AM   CHOLHDL 3 03/10/2012 10:15 AM   LDLCALC 49 03/10/2012 10:15 AM    Physical Exam:    VS:  BP 126/60   Pulse 60   Ht _0  (1.651 m)   Wt 247 lb (112 kg)   SpO2 96%   BMI 41.10 kg/m     Wt Readings from Last 3 Encounters:  09/05/17 247 lb (112 kg)  08/22/17 250 lb (113.4 kg)  08/13/17 248 lb 6.4 oz (112.7 kg)     Physical Exam  Constitutional: She is oriented to person, place, and time. She appears well-developed and well-nourished. No distress.  HENT:  Head: Normocephalic and atraumatic.  Eyes: No scleral icterus.  Neck: No JVD present.  Cardiovascular: Normal rate and regular rhythm.  No murmur heard. Pulmonary/Chest: Effort normal. She has no rales.  Abdominal: Soft.  Musculoskeletal: She exhibits no edema.  Right wrist without hematoma  Neurological: She is alert and oriented to person, place, and time.  Skin: Skin is warm and dry.    ASSESSMENT:    1. Coronary artery disease involving native coronary artery of native  heart with angina pectoris ([place])   2. Chronic diastolic heart failure ([place])   3. Nonrheumatic aortic valve stenosis   4. Chronic obstructive pulmonary disease, unspecified COPD type ([place])   5. Essential hypertension    PLAN:    In order of problems listed above:  1.  Coronary artery disease History of myocardial infarction in 2013 followed by CABG.  Recent cardiac catheterization with 2/4 bypass grafts patent.  She does have 70% left main stenosis but this was not hemodynamically significant by FFR.  Medical therapy has been recommended.  Nitrates were to be started but she never received a prescription.  I have recommended that we start her on isosorbide 30 mg daily.  If she cannot tolerate this, we can switch her to ranolazine.  Continue amlodipine, aspirin, statin, beta-blocker.  She has noted some nausea since her catheterization.  Obtain BMET today.  2.  Chronic diastolic heart failure (HCC) Volume appears stable on exam.  Continue current therapy.  3.  Nonrheumatic aortic valve stenosis Mild by recent echocardiogram.    4.  Chronic obstructive pulmonary disease, unspecified COPD type ([place]) PFTs and 2009 did demonstrate moderate obstruction.  If she continues to have dyspnea despite maximal antianginal therapy, consider referral to pulmonology.  5.  Essential hypertension The patient's blood pressure is controlled on her current regimen.  Continue current therapy.   Dispo:  Return in about 6 weeks (around 10/17/2017) for Close Follow Up, w/ Dr. Saunders Revel, or Richardson Dopp, PA-C.   Medication Adjustments/Labs and Tests Ordered: Current medicines are reviewed at length with the patient today.  Concerns regarding medicines are outlined above.  Tests Ordered: Orders Placed This Encounter  Procedures  . Basic metabolic panel  . EKG 12-Lead   Medication Changes: Meds ordered this encounter  Medications  . isosorbide mononitrate (IMDUR) 30 MG 24 hr tablet    Sig:  Take 1 tablet (30 mg  total) by mouth daily.    Dispense:  30 tablet    Refill:  11    Order Specific Question:   Supervising Provider    Answer:   Sueanne Margarita [[id number]]    Signed, Richardson Dopp, PA-C  09/05/2017 9:06 AM    [facility] [place], [place],   [address] Phone: [phone]; Fax: [phone]

## 2017-09-05 ENCOUNTER — Encounter: Payer: Self-pay | Admitting: Physician Assistant

## 2017-09-05 ENCOUNTER — Other Ambulatory Visit: Payer: Self-pay | Admitting: *Deleted

## 2017-09-05 ENCOUNTER — Ambulatory Visit (INDEPENDENT_AMBULATORY_CARE_PROVIDER_SITE_OTHER): Payer: Medicare Other | Admitting: Physician Assistant

## 2017-09-05 VITALS — BP 126/60 | HR 60 | Ht 65.0 in | Wt 247.0 lb

## 2017-09-05 DIAGNOSIS — I5032 Chronic diastolic (congestive) heart failure: Secondary | ICD-10-CM

## 2017-09-05 DIAGNOSIS — I10 Essential (primary) hypertension: Secondary | ICD-10-CM | POA: Diagnosis not present

## 2017-09-05 DIAGNOSIS — I25119 Atherosclerotic heart disease of native coronary artery with unspecified angina pectoris: Secondary | ICD-10-CM | POA: Diagnosis not present

## 2017-09-05 DIAGNOSIS — I350 Nonrheumatic aortic (valve) stenosis: Secondary | ICD-10-CM | POA: Diagnosis not present

## 2017-09-05 DIAGNOSIS — J449 Chronic obstructive pulmonary disease, unspecified: Secondary | ICD-10-CM | POA: Diagnosis not present

## 2017-09-05 DIAGNOSIS — Z79899 Other long term (current) drug therapy: Secondary | ICD-10-CM

## 2017-09-05 DIAGNOSIS — I209 Angina pectoris, unspecified: Secondary | ICD-10-CM

## 2017-09-05 LAB — BASIC METABOLIC PANEL
BUN / CREAT RATIO: 22 (ref 12–28)
BUN: 25 mg/dL (ref 8–27)
CO2: 21 mmol/L (ref 20–29)
CREATININE: 1.16 mg/dL — AB (ref 0.57–1.00)
Calcium: 9.6 mg/dL (ref 8.7–10.3)
Chloride: 101 mmol/L (ref 96–106)
GFR calc Af Amer: 54 mL/min/{1.73_m2} — ABNORMAL LOW (ref >59)
GFR, EST NON AFRICAN AMERICAN: 47 mL/min/{1.73_m2} — AB (ref >59)
Glucose: 85 mg/dL (ref 65–99)
POTASSIUM: 4.5 mmol/L (ref 3.5–5.2)
SODIUM: 140 mmol/L (ref 134–144)

## 2017-09-05 MED ORDER — ISOSORBIDE MONONITRATE ER 30 MG PO TB24: 30.0000 mg | ORAL_TABLET | Freq: Every day | ORAL | 11 refills | Status: DC

## 2017-09-05 NOTE — Patient Instructions (Addendum)
Medication Instructions:  1. START IMDUR 30 MG DAILY; RX HAS BEEN SENT IN  Labwork: TODAY BMET  Testing/Procedures: NONE ORDERED TODAY  Follow-Up: 10/20/17 @ 10:20 WITH DR. END   Any Other Special Instructions Will Be Listed Below (If Applicable).     If you need a refill on your cardiac medications before your next appointment, please call your pharmacy.

## 2017-09-18 ENCOUNTER — Other Ambulatory Visit: Payer: Medicare Other

## 2017-09-18 ENCOUNTER — Telehealth: Payer: Self-pay | Admitting: *Deleted

## 2017-09-18 NOTE — Telephone Encounter (Signed)
That is fine.  Agree she did not need the BMET today. Labs normal. Thank you. Continue current medications and follow up as planned.  Richardson Dopp, PA-C    09/18/2017 3:23 PM

## 2017-09-18 NOTE — Telephone Encounter (Signed)
Pt aware of lab results and recommendations per Richardson Dopp PA-C.  Pt verbalized understanding and agrees with this plan.

## 2017-09-18 NOTE — Telephone Encounter (Signed)
Scott, this pt showed up today for her repeat BMET you ordered on 09/05/17, to reassess her creatinine.  She just had full panel labs done at Dr. Verdie Drown office with [facility] on 2/12.  You can see all the labs they did on her and the results in that 2/12 OV note from Dr. Harrell Lark.  Her CMET came back normal and her creatinine was 1.10 and BUN 18 from that visit.   She wanted for you to use those lab results vs getting re-stuck and getting a BMET done today.  Pt cancelled her lab appt, and I told her that I would send you this message to make you aware of recent labs done at her PCP on 2/12, and you can see these in the office note.

## 2017-09-19 NOTE — Progress Notes (Signed)
This encounter was created in error - please disregard.

## 2017-10-20 ENCOUNTER — Ambulatory Visit (INDEPENDENT_AMBULATORY_CARE_PROVIDER_SITE_OTHER): Payer: Medicare Other | Admitting: Internal Medicine

## 2017-10-20 ENCOUNTER — Encounter: Payer: Self-pay | Admitting: Internal Medicine

## 2017-10-20 VITALS — BP 116/64 | HR 61 | Ht 65.5 in | Wt 245.0 lb

## 2017-10-20 DIAGNOSIS — I10 Essential (primary) hypertension: Secondary | ICD-10-CM

## 2017-10-20 DIAGNOSIS — I25118 Atherosclerotic heart disease of native coronary artery with other forms of angina pectoris: Secondary | ICD-10-CM

## 2017-10-20 DIAGNOSIS — E785 Hyperlipidemia, unspecified: Secondary | ICD-10-CM | POA: Diagnosis not present

## 2017-10-20 DIAGNOSIS — I350 Nonrheumatic aortic (valve) stenosis: Secondary | ICD-10-CM | POA: Diagnosis not present

## 2017-10-20 DIAGNOSIS — J449 Chronic obstructive pulmonary disease, unspecified: Secondary | ICD-10-CM | POA: Diagnosis not present

## 2017-10-20 DIAGNOSIS — I5032 Chronic diastolic (congestive) heart failure: Secondary | ICD-10-CM

## 2017-10-20 MED ORDER — METOPROLOL TARTRATE 25 MG PO TABS: 25.0000 mg | ORAL_TABLET | Freq: Two times a day (BID) | ORAL | 3 refills | Status: DC

## 2017-10-20 NOTE — Progress Notes (Addendum)
Follow-up Outpatient Visit Date: 10/20/2017  Primary Care Provider: Verdell Carmine., MD [address] [place] [address]  Chief Complaint: Dyspnea on exertion  HPI:  Jamie [name] is a 72 y.o. year-old female with history of coronary artery disease status post CABG (1914), diastolic heart failure, hyperlipidemia, carotid artery stenosis, breast cancer status post bilateral mastectomies, diabetes mellitus, COPD, sleep apnea, and obesity, who presents for follow-up of shortness of breath.  I last saw Jamie [name] in December, at which time she began experiencing worsening dyspnea on exertion, which she noted as Jamie [name] anginal equivalent.  Subsequent echo showed preserved LVEF.  Due to continued symptoms with medical therapy, we proceeded with left and right heart catheterization in January.  This showed severe native CAD with 70% ostial LMCA, 50% proximal LAD, and 100% mid RCA lesions.  LIMA to LAD was found to be atretic.  SVG to D1 was patent; the jump portion to OM2 was chronically occluded.  SVG to the distal RCA was patent.  FFR of the ostial LMCA and LAD was not hemodynamically significant by FFR (0.82).  Left heart, right heart, pulmonary artery pressures were mildly elevated.  We agreed to optimize medical therapy.  She followed up with Richardson Dopp, PA, 6 weeks ago and noted that Jamie [name] breathing had improved somewhat though she still noted considerable exertional dyspnea as well as some orthopnea.  Today, Jamie [name] reports that she feels about the same.  Last week, she experienced 5 days of significant nausea.  She saw Jamie [name] bariatric surgeon, Dr. Hassell Done, who deflated Jamie [name] lap band.  She experienced brief severe right upper quadrant pain with this procedure but subsequently has not had any further nausea.  She denies any edema since starting furosemide.  She still notes exertional dyspnea when walking for more than 100 feet at a time.  She is able to perform all of Jamie [name] usual tasks at home.  She has not had  any chest pain, palpitations, or lightheadedness.  --------------------------------------------------------------------------------------------------  Past Medical History:  Diagnosis Date  . Anxiety   . Asthma   . Breast cancer ([facility]) 08/2011   lt. breast ca  . CAD (coronary artery disease)    NSTEMI 5/13:  LHC demonstrated oLM 75%, pLAD 80%, RCA occluded, filled by collaterals from the LAD.  Echo 12/10/11: EF 60%.; CABG 12/13/11: LIMA-LAD, SVG-RCA, SVG-diagonal and OM.   Marland Kitchen Carotid artery disease ([facility])    a.  Pre-CABG Do[place]lers 5/13: Bilateral 40-59%; b.  Carotid US 7/82: RICA 95-62%, LICA 13-08%, normal subclavians bilaterally >> FU 6 months;  c. Carotid US 65/78: RICA 46-96%; LICA 29-52%, normal subclavians >> FU 1 year // d. Carotid US 11/17:  Stable 40-59% bilateral ICA stenosis; F/U 1 year.  Marland Kitchen COPD (chronic obstructive pulmonary disease) ([facility])   . DM2 (diabetes mellitus, type 2) ([facility])    metformin  . HLD (hyperlipidemia)   . Hx of cardiovascular stress test    Myoview 5/16:  small area of inf-lat ischemia, EF 70%; Low Risk >> try medical Rx  . Hx of echocardiogram    Echo 5/16:  EF 60-65%, no RWMA, Gr 1 DD, Ao sclerosis, mild AS, mean 14 mmHg, MAC  . Hx of migraines   . Hypertension   . Myocardial infarction ([facility])   . Obesity   . Shortness of breath   . Sleep apnea    sleep study2-3 yrs ago lost weight afterward but gained back  . UTI (lower urinary tract infection)    freq-bladder  implant -removed   Past Surgical History:  Procedure Laterality Date  . ABDOMINAL HYSTERECTOMY  1977  . APPENDECTOMY    . AXILLARY LYMPH NODE DISSECTION  10/18/2011   Procedure: AXILLARY LYMPH NODE DISSECTION;  Surgeon: Merrie Roof, MD;  Location: [facility];  Service: General;  Laterality: Left;  . BREAST RECONSTRUCTION  10/18/2011   Procedure: BREAST RECONSTRUCTION;  Surgeon: Theodoro Kos, DO;  Location: [facility];  Service: Plastics;  Laterality: Bilateral;   bilateral breast reconstruction with  bilateral tissue expander and placement of flex hd.  . CESAREAN SECTION  04/1976  . CORONARY ARTERY BYPASS GRAFT  12/13/2011   Procedure: CORONARY ARTERY BYPASS GRAFTING (CABG);  Surgeon: Gaye Pollack, MD;  Location: [facility];  Service: Open Heart Surgery;  Laterality: N/A;  Times four using endoscopically harvested left greater saphenous vein and left internal mammary artery. Right greater saphenous vein attempted; not appropriate for vein harvest.  . INTRAVASCULAR PRESSURE WIRE/FFR STUDY N/A 08/22/2017   Procedure: INTRAVASCULAR PRESSURE WIRE/FFR STUDY;  Surgeon: Nelva Bush, MD;  Location: [facility];  Service: Cardiovascular;  Laterality: N/A;  . LAPAROSCOPIC GASTRIC BANDING  2010  . LEFT HEART CATHETERIZATION WITH CORONARY ANGIOGRAM N/A 12/11/2011   Procedure: LEFT HEART CATHETERIZATION WITH CORONARY ANGIOGRAM;  Surgeon: Larey Dresser, MD;  Location: [facility];  Service: Cardiovascular;  Laterality: N/A;  . LESION REMOVAL  04/09/2012   Procedure: LESION REMOVAL;  Surgeon: Theodoro Kos, DO;  Location: [facility];  Service: Plastics;  Laterality: Bilateral;  . MASTECTOMY W/ SENTINEL NODE BIOPSY  10/18/2011   Procedure: MASTECTOMY WITH SENTINEL LYMPH NODE BIOPSY;  Surgeon: Merrie Roof, MD;  Location: [facility];  Service: General;  Laterality: Bilateral;  bilateral mastectomy and left sentinel node biopsy  . NASAL SINUS SURGERY    . PORT-A-CATH REMOVAL  04/09/2012   Procedure: REMOVAL PORT-A-CATH;  Surgeon: Merrie Roof, MD;  Location: [facility];  Service: General;  Laterality: Right;  . PORTACATH PLACEMENT  12/02/2011   Procedure: INSERTION PORT-A-CATH;  Surgeon: Merrie Roof, MD;  Location: [facility];  Service: General;  Laterality: Right;  . RIGHT/LEFT HEART CATH AND CORONARY/GRAFT ANGIOGRAPHY N/A 08/22/2017   Procedure: RIGHT/LEFT HEART CATH AND CORONARY/GRAFT ANGIOGRAPHY;  Surgeon: Nelva Bush, MD;  Location: [facility] CV [facility];  Service:  Cardiovascular;  Laterality: N/A;  . TONSILLECTOMY    . US ECHOCARDIOGRAPHY     at [facility] [facility] Left 11/26/2013   Procedure: VIDEO BRONCHOSCOPY WITH ENDOBRONCHIAL NAVIGATION;  Surgeon: Collene Gobble, MD;  Location: [facility];  Service: Thoracic;  Laterality: Left;    Current Meds  Medication Sig  . albuterol (PROVENTIL HFA;VENTOLIN HFA) 108 (90 Base) MCG/ACT inhaler Inhale 2 puffs into the lungs every 6 (six) hours as needed for wheezing or shortness of breath.  Marland Kitchen amLODipine (NORVASC) 5 MG tablet TAKE 1 TABLET BY MOUTH ONCE DAILY -PLEASE  MAKE  OVERDUE  YEARLY  APPOINTMENT  WITH  DR.  Rahil Passey  BEFORE  ANYMORE  REFILLS.  [phone].  Marland Kitchen aspirin EC 81 MG tablet Take 1 tablet (81 mg total) by mouth daily.  Marland Kitchen atorvastatin (LIPITOR) 80 MG tablet Take 1 tablet (80 mg total) by mouth daily at 6 PM. Please make overdue yearly appt with Dr. Saunders Revel before anymore refills. 2nd attempt  . buPROPion (WELLBUTRIN XL) 300 MG 24 hr tablet Take 300 mg by mouth daily.   . cephALEXin (KEFLEX)  500 MG capsule Take 500 mg by mouth daily. abx for bladder issues   . Cholecalciferol (VITAMIN D3) 1000 units CAPS Take 1 capsule by mouth daily.  . fluticasone (FLONASE) 50 MCG/ACT nasal spray Place 2 sprays into both nostrils daily.   . furosemide (LASIX) 40 MG tablet Take 0.5 tablets (20 mg total) by mouth daily.  . isosorbide mononitrate (IMDUR) 30 MG 24 hr tablet Take 1 tablet (30 mg total) by mouth daily.  Marland Kitchen lisinopril (PRINIVIL,ZESTRIL) 10 MG tablet TAKE 1 TABLET BY MOUTH ONCE DAILY(NEEDS TO CALL OFFICE FOR APPT)  . metFORMIN (GLUCOPHAGE) 500 MG tablet Take 1 tablet (500 mg total) by mouth 2 (two) times daily.  . Multiple Vitamin (MULTI VITAMIN DAILY PO) Take 1 tablet by mouth daily.  . nitroGLYCERIN (NITROSTAT) 0.4 MG SL tablet Place 1 tablet (0.4 mg total) under the tongue every 5 (five) minutes as needed.  Vladimir Faster Glycol-Propyl Glycol (SYSTANE OP) Place 1 drop into  both eyes 4 (four) times daily as needed (for dry eyes).  . [DISCONTINUED] metoprolol tartrate (LOPRESSOR) 50 MG tablet TAKE 1 TABLET BY MOUTH TWICE DAILY(PLEASE MAKE OVERDUE YEARLY APPT WITH DR BEFORE ANY MORE REFILLS 2ND ATTEMPT)    Allergies: Vicodin [hydrocodone-acetaminophen]; Sulfa antibiotics; Escitalopram; and Sulfamethoxazole  Social History   Socioeconomic History  . Marital status: Married    Spouse name: Not on file  . Number of children: 6  . Years of education: Not on file  . Highest education level: Not on file  Social Needs  . Financial resource strain: Not on file  . Food insecurity - worry: Not on file  . Food insecurity - inability: Not on file  . Transportation needs - medical: Not on file  . Transportation needs - non-medical: Not on file  Occupational History  . Occupation: retired  Tobacco Use  . Smoking status: Former Smoker    Packs/day: 2.00    Years: 40.00    Pack years: 80.00    Types: Cigarettes    Last attempt to quit: 08/25/2006    Years since quitting: 11.1  . Smokeless tobacco: Never Used  Substance and Sexual Activity  . Alcohol use: Yes    Comment: 1-2 times a year  . Drug use: No  . Sexual activity: Not Currently  Other Topics Concern  . Not on file  Social History Narrative  . Not on file    Family History  Problem Relation Age of Onset  . Heart disease Mother   . Heart disease Father   . Breast cancer Maternal Aunt   . Ovarian cancer Paternal Aunt   . Breast cancer Maternal Aunt   . Anesthesia problems Neg Hx   . Hypotension Neg Hx   . Malignant hyperthermia Neg Hx   . Pseudochol deficiency Neg Hx     Review of Systems: A 12-system review of systems was performed and was negative except as noted in the HPI.  --------------------------------------------------------------------------------------------------  Physical Exam: BP 116/64   Pulse 61   Ht 5' 5.5" (1.664 m)   Wt 245 lb (111.1 kg)   BMI 40.15 kg/m    General: Morbidly obese woman, seated comfortably in the exam room HEENT: No conjunctival pallor or scleral icterus. Moist mucous membranes.  OP clear. Neck: Supple without lymphadenopathy, thyromegaly, JVD, or HJR. Lungs: Normal work of breathing. Clear to auscultation bilaterally without wheezes or crackles. Heart: Distant heart sounds.  Regular rate and rhythm without murmurs, rubs, or gallops. Non-displaced PMI. Abd: Bowel sounds present.  Soft, NT/ND without hepatosplenomegaly Ext: No lower extremity edema. Radial, PT, and DP pulses are 2+ bilaterally.  Left radial arteriotomy site is well-healed. Skin: Warm and dry without rash.   Lab Results  Component Value Date   WBC 6.5 08/13/2017   HGB 12.9 08/13/2017   HCT 40.2 08/13/2017   MCV 86 08/13/2017   PLT 217 08/13/2017    Lab Results  Component Value Date   NA 140 09/05/2017   K 4.5 09/05/2017   CL 101 09/05/2017   CO2 21 09/05/2017   BUN 25 09/05/2017   CREATININE 1.16 (H) 09/05/2017   GLUCOSE 85 09/05/2017   ALT 28 05/20/2017    Lab Results  Component Value Date   CHOL 126 03/10/2012   HDL 41.10 03/10/2012   LDLCALC 49 03/10/2012   TRIG 182.0 (H) 03/10/2012   CHOLHDL 3 03/10/2012    --------------------------------------------------------------------------------------------------  ASSESSMENT AND PLAN: Coronary artery disease with stable angina Jamie [name] identifies shortness of breath as Jamie [name] anginal equivalent, though certainly this could be due to any number of factors.  She has severe native CAD with failure of multiple grafts.  Most significant lesion is obviously the ostial LMCA, though this was not hemodynamically significant by FFR.  We have agreed to continue with medical therapy, including metoprolol, amlodipine, and isosorbide mononitrate.  If Jamie [name] symptoms do not improve or progress despite addressing the issues outlined below, we would need to reconsider PCI to the ostial LMCA in the future.  Chronic  diastolic heart failure Jamie [name] appears euvolemic on exam today but reports NYHA class III symptoms.  We will continue Jamie [name] current dose of furosemide 20 mg daily.  I have encouraged Jamie [name] to increase Jamie [name] activity gradually with the hope that some of Jamie [name] symptoms are due to obesity and deconditioning.  We will decrease Jamie [name] metoprolol tartrate from 50 mg twice daily to 25 mg twice daily to see if excessive beta-blockade could be worsening some of Jamie [name] symptoms.  Hyperlipidemia Most recent LDL [facility]) was at goal at 91 in 07/2018.  We will continue with atorvastatin 80 mg daily.  Aortic stenosis Mild aortic stenosis noted by recent echocardiogram and catheterization.  Continue clinical follow-up.  COPD Remote PFTs were notable for moderate obstructive defect.  Further consultation with pulmonology may be worthwhile to see if treatment optimization could improve Jamie [name] dyspnea.  Essential hypertension Blood pressure is very well controlled today.  No medication changes at this time.  Follow-up: Return to clinic in 3 months with Richardson Dopp, PA.  Nelva Bush, MD 10/21/2017 8:44 PM

## 2017-10-20 NOTE — Patient Instructions (Addendum)
Medication Instructions:  Metoprolol Tartrate (Lopressor) 25 mg by mouth twice per day  -- If you need a refill on your cardiac medications before your next appointment, please call your pharmacy. --  Labwork: None ordered  Testing/Procedures: None ordered  Follow-Up: Your physician wants you to follow-up in: 3 months with Richardson Dopp PA    Thank you for choosing [facility]!!    Any Other Special Instructions Will Be Listed Below (If Applicable).

## 2017-10-21 ENCOUNTER — Encounter: Payer: Self-pay | Admitting: Internal Medicine

## 2017-10-21 NOTE — Addendum Note (Signed)
Addended by: Sacheen Arrasmith A on: 10/21/2017 08:54 PM   Modules accepted: Level of Service

## 2017-10-23 ENCOUNTER — Other Ambulatory Visit: Payer: Self-pay | Admitting: Physician Assistant

## 2017-10-23 ENCOUNTER — Other Ambulatory Visit: Payer: Self-pay | Admitting: *Deleted

## 2017-12-10 ENCOUNTER — Telehealth: Payer: Self-pay | Admitting: Internal Medicine

## 2017-12-10 NOTE — Telephone Encounter (Signed)
Spoke with patient who continues to be SOB.  She has suffered 2 bouts of Bronchitis and has stopped taking antibiotics due to nausea.    I referred her to her PCP for further evaluation.  I told her to call us if symptoms worsen.  She verbalized understanding.  Please advise, thank you

## 2017-12-10 NOTE — Telephone Encounter (Signed)
Please have Jamie [name] speak with her PCP about a referral to pulmonology.  In the meantime, I suggest that we increase isosorbide mononitrate to 60 mg daily and have her follow-up in 2 to 3 weeks with me or an APP to reassess her symptoms.  Nelva Bush, MD [facility] Pager: [phone]

## 2017-12-10 NOTE — Telephone Encounter (Signed)
Pt c/o Shortness Of Breath: STAT if SOB developed within the last 24 hours or pt is noticeably SOB on the phone  1. Are you currently SOB (can you hear that pt is SOB on the phone)? Yes , yes   2. How long have you been experiencing SOB?  November   3. Are you SOB when sitting or when up moving around?  Up moving   4. Are you currently experiencing any other symptoms? Weak and tired

## 2017-12-11 ENCOUNTER — Other Ambulatory Visit: Payer: Self-pay

## 2017-12-11 MED ORDER — ISOSORBIDE MONONITRATE ER 60 MG PO TB24: 60.0000 mg | ORAL_TABLET | Freq: Every day | ORAL | 3 refills | Status: DC

## 2017-12-19 ENCOUNTER — Encounter: Payer: Self-pay | Admitting: Physician Assistant

## 2018-01-12 ENCOUNTER — Telehealth: Payer: Self-pay | Admitting: Physician Assistant

## 2018-01-12 ENCOUNTER — Ambulatory Visit (INDEPENDENT_AMBULATORY_CARE_PROVIDER_SITE_OTHER): Payer: Medicare Other | Admitting: Physician Assistant

## 2018-01-12 ENCOUNTER — Encounter: Payer: Self-pay | Admitting: Physician Assistant

## 2018-01-12 VITALS — BP 104/72 | HR 67 | Ht 65.6 in | Wt 234.1 lb

## 2018-01-12 DIAGNOSIS — I350 Nonrheumatic aortic (valve) stenosis: Secondary | ICD-10-CM | POA: Diagnosis not present

## 2018-01-12 DIAGNOSIS — I10 Essential (primary) hypertension: Secondary | ICD-10-CM | POA: Diagnosis not present

## 2018-01-12 DIAGNOSIS — I25118 Atherosclerotic heart disease of native coronary artery with other forms of angina pectoris: Secondary | ICD-10-CM

## 2018-01-12 DIAGNOSIS — I25119 Atherosclerotic heart disease of native coronary artery with unspecified angina pectoris: Secondary | ICD-10-CM

## 2018-01-12 DIAGNOSIS — I5032 Chronic diastolic (congestive) heart failure: Secondary | ICD-10-CM

## 2018-01-12 MED ORDER — LISINOPRIL 10 MG PO TABS: 5.0000 mg | ORAL_TABLET | Freq: Every day | ORAL | 3 refills | Status: DC

## 2018-01-12 MED ORDER — ISOSORBIDE MONONITRATE ER 30 MG PO TB24: 30.0000 mg | ORAL_TABLET | ORAL | 3 refills | Status: DC

## 2018-01-12 MED ORDER — AMLODIPINE BESYLATE 2.5 MG PO TABS: 2.5000 mg | ORAL_TABLET | Freq: Every day | ORAL | 3 refills | Status: DC

## 2018-01-12 MED ORDER — ISOSORBIDE MONONITRATE ER 30 MG PO TB24: 90.0000 mg | ORAL_TABLET | Freq: Every day | ORAL | 3 refills | Status: DC

## 2018-01-12 NOTE — Progress Notes (Signed)
Cardiology Office Note:    Date:  01/12/2018   ID:  DUSTIE BRITTLE, DOB 1946-03-13, MRN [id number]  PCP:  Verdell Carmine., MD  Cardiologist:  Nelva Bush, MD   Referring MD: Verdell Carmine., MD   Chief Complaint  Patient presents with  . Follow-up    CAD    History of Present Illness:    Jamie [name] is a 72 y.o. female with coronary artery disease status post non-STEMI in May 2013 followed by CABG, diastolic heart failure, breast cancer status post bilateral mastectomies, diabetes, sleep apnea, COPD, carotid artery disease, obesity. Nuclear stress test in May 2016 was low risk with a small area of inferolateral ischemia. She had continued symptoms of shortness of breath and Cardiac Catheterization in 08/2017 demonstrated an atretic LIMA-LAD, patent SVG-D1 with an occluded jump portion to the OM2, patent SVG-RCA.  There was also 70% ostial left main stenosis which was not hemodynamically significant by FFR.  Medical therapy was recommended.  If she has no relief of symptoms, pulmonary consultation could be considered.  If she has continued dyspnea despite maximal antianginal therapy, adequate diuresis, weight loss and pulmonary evaluation, PCI to the left main could be considered.    She was last seen by Dr. Saunders Revel in March 2019.  She continued to complain of significant shortness of breath.  Her beta-blocker dose was reduced to see if this was contributing to some of her symptoms.  It was questioned if obesity and deconditioning was also contributing to her symptoms.  Increased activity was also encouraged.  The patient called in with shortness of breath last month.  Her Isosorbide dose was increased and she was asked to discuss with her PCP a referral to Pulmonology.   Jamie [name] returns for follow-up.  Unfortunately, she has had several bouts of upper respiratory tract infection.  She unfortunately gets sick with all antibiotics.  She seems to be improving.  She does note that her  breathing seemed to improve after increasing isosorbide.  She did see pulmonology last week (Dr. Worthy Keeler with [phone]).  She was placed on Spiriva and PFTs are pending.  She denies chest discomfort.  Her cough is improved.  She denies PND or edema.  She denies syncope.  Prior CV studies:   The following studies were reviewed today:  Cardiac catheterization 08/22/17 Left main ostial 70 LAD proximal 50 RCA mid 100 LIMA-LAD atretic SVG-D1 patent with occluded jump portion to OM2 SVG-RCA patent FFR of ostial left main 0.82 (not hemodynamically significant) Mean RA 8, PCWP 23, LVEDP 23, peak to peak AV gradient 15  Echo 07/24/17 Mild concentric LVH, EF 25-63, normal diastolic function, mild aortic stenosis(mean 14, peak 31; LVOT/AV 0.46), MAC, mild LAE  Carotid US 07/11/17 Bilateral ICA 1-39>>Follow-up 2 years  Chest CT 05/20/17 IMPRESSION: 1. Stable chest CT without evidence of local recurrence or metastatic disease. 2. Study is limited by artifact from the external breast prostheses which should be removed on subsequent CT imaging. 3. Hepatic steatosis and cholelithiasis. 4. Aortic Atherosclerosis (ICD10-I70.0).  Carotid US 89/37:  RICA 34-28%; LICA 76-81%, normal subclavians >> FU 1 year  Myoview 12/14/14 Myocardial perfusion is abnormal. Findings consistent with basal inferolateral ischemia. This is a low risk study. Overall left ventricular systolic function was normal. LV cavity size is normal. The left ventricular ejection fraction is hyperdynamic (>65%). There is no prior study for comparison.  Echo 12/13/14 EF 60-65%,Normal wall motion, grade 1 diastolic dysfunction, aortic sclerosis, mild aortic stenosis,  mean gradient 14 mmHg, MAC  Echo 02/04/13 - EF 60% to 65%. Wall motion was normal;  - Aortic valve: Valve area: 3.07cm^2(VTI). Valve area: 3.14cm^2 (Vmax).  Past Medical History:  Diagnosis Date  . Anxiety   . Asthma   . Breast cancer ([place]) 08/2011   lt.  breast ca  . CAD (coronary artery disease)    NSTEMI 5/13:  LHC demonstrated oLM 75%, pLAD 80%, RCA occluded, filled by collaterals from the LAD.  Echo 12/10/11: EF 60%.; CABG 12/13/11: LIMA-LAD, SVG-RCA, SVG-diagonal and OM.   Marland Kitchen Carotid artery disease ([place])    a.  Pre-CABG Dopplers 5/13: Bilateral 40-59%; b.  Carotid US 6/29: RICA 52-84%, LICA 13-24%, normal subclavians bilaterally >> FU 6 months;  c. Carotid US 40/10: RICA 27-25%; LICA 36-64%, normal subclavians >> FU 1 year // d. Carotid US 11/17:  Stable 40-59% bilateral ICA stenosis; F/U 1 year.  Marland Kitchen COPD (chronic obstructive pulmonary disease) ([place])   . DM2 (diabetes mellitus, type 2) ([facility])    metformin  . HLD (hyperlipidemia)   . Hx of cardiovascular stress test    Myoview 5/16:  small area of inf-lat ischemia, EF 70%; Low Risk >> try medical Rx  . Hx of echocardiogram    Echo 5/16:  EF 60-65%, no RWMA, Gr 1 DD, Ao sclerosis, mild AS, mean 14 mmHg, MAC  . Hx of migraines   . Hypertension   . Myocardial infarction ([place])   . Obesity   . Shortness of breath   . Sleep apnea    sleep study2-3 yrs ago lost weight afterward but gained back  . UTI (lower urinary tract infection)    freq-bladder implant -removed   Surgical Hx: The patient  has a past surgical history that includes Cesarean section (04/1976); Abdominal hysterectomy (1977); Laparoscopic gastric banding (2010); Nasal sinus surgery; Tonsillectomy; Appendectomy; Mastectomy w/ sentinel node biopsy (10/18/2011); Axillary lymph node dissection (10/18/2011); Breast reconstruction (10/18/2011); US ECHOCARDIOGRAPHY; Portacath placement (12/02/2011); Coronary artery bypass graft (12/13/2011); Lesion removal (04/09/2012); Port-a-cath removal (04/09/2012); Video bronchoscopy with endobronchial navigation (Left, 11/26/2013); left heart catheterization with coronary angiogram (N/A, 12/11/2011); RIGHT/LEFT HEART CATH AND CORONARY/GRAFT ANGIOGRAPHY (N/A, 08/22/2017); and INTRAVASCULAR PRESSURE WIRE/FFR STUDY (N/A,  08/22/2017).   Current Medications: Current Meds  Medication Sig  . albuterol (PROVENTIL HFA;VENTOLIN HFA) 108 (90 Base) MCG/ACT inhaler Inhale 2 puffs into the lungs every 6 (six) hours as needed for wheezing or shortness of breath.  Marland Kitchen amLODipine (NORVASC) 5 MG tablet Take 5 mg by mouth daily.  Marland Kitchen aspirin EC 81 MG tablet Take 1 tablet (81 mg total) by mouth daily.  Marland Kitchen atorvastatin (LIPITOR) 80 MG tablet TAKE 1 TABLET BY MOUTH ONCE DAILY AT  6PM  . buPROPion (WELLBUTRIN XL) 300 MG 24 hr tablet Take 300 mg by mouth daily.   . cephALEXin (KEFLEX) 500 MG capsule Take 500 mg by mouth daily. abx for bladder issues   . Cholecalciferol (VITAMIN D3) 1000 units CAPS Take 1 capsule by mouth daily.  . fluticasone (FLONASE) 50 MCG/ACT nasal spray Place 2 sprays into both nostrils daily.   . furosemide (LASIX) 40 MG tablet Take 0.5 tablets (20 mg total) by mouth daily.  Marland Kitchen lisinopril (PRINIVIL,ZESTRIL) 10 MG tablet Take 10 mg by mouth daily.  . metFORMIN (GLUCOPHAGE) 500 MG tablet Take 1 tablet (500 mg total) by mouth 2 (two) times daily.  . metoprolol tartrate (LOPRESSOR) 25 MG tablet Take 1 tablet (25 mg total) by mouth 2 (two) times daily.  . Multiple Vitamin (  MULTI VITAMIN DAILY PO) Take 1 tablet by mouth daily.  . nitroGLYCERIN (NITROSTAT) 0.4 MG SL tablet Place 1 tablet (0.4 mg total) under the tongue every 5 (five) minutes as needed.  Vladimir Faster Glycol-Propyl Glycol (SYSTANE OP) Place 1 drop into both eyes 4 (four) times daily as needed (for dry eyes).  . [DISCONTINUED] isosorbide mononitrate (IMDUR) 60 MG 24 hr tablet Take 1 tablet (60 mg total) by mouth daily.     Allergies:   Vicodin [hydrocodone-acetaminophen]; Sulfa antibiotics; Escitalopram; and Sulfamethoxazole   Social History   Tobacco Use  . Smoking status: Former Smoker    Packs/day: 2.00    Years: 40.00    Pack years: 80.00    Types: Cigarettes    Last attempt to quit: 08/25/2006    Years since quitting: 11.3  . Smokeless  tobacco: Never Used  Substance Use Topics  . Alcohol use: Yes    Comment: 1-2 times a year  . Drug use: No     Family Hx: The patient's family history includes Breast cancer in her maternal aunt and maternal aunt; Heart disease in her father and mother; Ovarian cancer in her paternal aunt. There is no history of Anesthesia problems, Hypotension, Malignant hyperthermia, or Pseudochol deficiency.  ROS:   Please see the history of present illness.    Review of Systems  Constitution: Positive for malaise/fatigue.  HENT: Positive for hearing loss.   Eyes: Positive for visual disturbance.  Cardiovascular: Positive for dyspnea on exertion.  Respiratory: Positive for cough.   Musculoskeletal: Positive for back pain.   All other systems reviewed and are negative.   EKGs/Labs/Other Test Reviewed:    EKG:  EKG is  ordered today.  The ekg ordered today demonstrates normal sinus rhythm, heart rate 67, normal axis, nonspecific ST-T wave changes, QTC 420, similar to prior tracing dated 09/05/2017  Recent Labs: 05/20/2017: ALT 28 08/13/2017: Hemoglobin 12.9; Platelets 217 09/05/2017: BUN 25; Creatinine, Ser 1.16; Potassium 4.5; Sodium 140   Recent Lipid Panel Labs from PCP 09/16/2017: Component Name Value Ref Range  LDL Direct 64 <150 mg/dL  Total Cholesterol 123 25 - 199 MG/DL  Triglycerides 174 (H) 10 - 150 MG/DL  HDL Cholesterol 40 35 - 135 MG/DL  Total Chol / HDL Cholesterol 3.1 <4.5      Physical Exam:    VS:  BP 104/72   Pulse 67   Ht 5' 5.6" (1.666 m)   Wt 234 lb 1.9 oz (106.2 kg)   SpO2 94%   BMI 38.25 kg/m     Wt Readings from Last 3 Encounters:  01/12/18 234 lb 1.9 oz (106.2 kg)  10/20/17 245 lb (111.1 kg)  09/05/17 247 lb (112 kg)     Physical Exam  Constitutional: She is oriented to person, place, and time. She appears well-developed and well-nourished. No distress.  HENT:  Head: Normocephalic and atraumatic.  Neck: Neck supple. No JVD present.  Cardiovascular:  Normal rate, regular rhythm, S1 normal and S2 normal.  Murmur heard.  Systolic murmur is present with a grade of 1/6 at the upper right sternal border. Pulmonary/Chest: Effort normal. She has no rales.  Abdominal: Soft. There is no hepatomegaly.  Musculoskeletal: She exhibits no edema.  Neurological: She is alert and oriented to person, place, and time.  Skin: Skin is warm and dry.    ASSESSMENT & PLAN:    Coronary artery disease of native artery of native heart with stable angina pectoris ([place]) History of myocardial infarction in 2013  followed by CABG.  Cardiac catheterization in January 2019 demonstrated 2/4 bypass grafts patent.  She also has 70% left main stenosis which was not hemodynamically significant by FFR.  She has been managed medically.  Her anginal equivalent seems to be shortness of breath.  However, her shortness of breath is likely multifactorial.  She has recently seen pulmonology and has had some medication changes made.  PFTs are pending.  Jamie [name] does feel that the increase in isosorbide seems to have helped some.  She tells me that she is currently taking isosorbide 30 mg twice a day.  Her blood pressure somewhat tenuous.  I did consider starting her on Ranolazine today.  However, we do not have any samples.  -Decrease lisinopril to 5 mg daily  -Change isosorbide to 60 mg once daily  -If needed, increase isosorbide to 90 mg once daily  -If her breathing is not significantly improved, she will call and we can consider ranolazine 500 mg   Chronic diastolic congestive heart failure ([place]) EF 60-65 by echo December 2018.  Her volume appears stable on current medical therapy.  Nonrheumatic aortic valve stenosis Mild aortic stenosis by echocardiogram December 2018.  Essential hypertension The patient's blood pressure is controlled on her current regimen.  Continue current therapy.   COPD She is now followed [facility] pul[facility]ology [facility].  She has recently been started  on Spiriva and PFTs are pending.     Dispo:  Return in about 3 months (around 04/14/2018) for Routine Follow Up, w/ Dr. Saunders Revel, or Richardson Dopp, PA-C.   Medication Adjustments/Labs and Tests Ordered: Current medicines are reviewed at length with the patient today.  Concerns regarding medicines are outlined above.  Tests Ordered: Orders Placed This Encounter  Procedures  . EKG 12-Lead   Medication Changes: Meds ordered this encounter  Medications  . DISCONTD: isosorbide mononitrate (IMDUR) 30 MG 24 hr tablet    Sig: Take 1 tablet (30 mg total) by mouth 90 (ninety) minutes before procedure for 1 dose.    Dispense:  90 tablet    Refill:  3    DOSE INCREASE  . isosorbide mononitrate (IMDUR) 30 MG 24 hr tablet    Sig: Take 3 tablets (90 mg total) by mouth daily.    Dispense:  270 tablet    Refill:  3    DOSE INCREASE    Signed, Richardson Dopp, PA-C  01/12/2018 10:33 AM    [address] [place], [place]  [address] Phone: [phone]; Fax: [phone]

## 2018-01-12 NOTE — Patient Instructions (Signed)
Medication Instructions:  Check your medications when you get home. We have you listed as taking Lisinopril 10 mg Once daily  I want you to decrease this to Lisinopril 5 mg (take 1/2 tablet of the 10 mg pill)  -If you are already taking Lisinopril 5 mg, call us  You are currently taking Isosorbide 30 mg Twice daily  Change the Isosorbide to take both 30 mg tablets to the morning to equal 60 mg Once daily  After 1 week, if you still feel like your breathing is not better, increase the Isosorbide to 90 mg Once daily (three 30 mg tablets together)  Call if you continue to have shortness of breath - if we have Ranexa samples in, I will try you on this medication  Labwork: None   Testing/Procedures: None   Follow-Up: Nelva Bush, MD in 3 months   Any Other Special Instructions Will Be Listed Below (If Applicable).  If you need a refill on your cardiac medications before your next appointment, please call your pharmacy.

## 2018-01-12 NOTE — Telephone Encounter (Signed)
New Message  Pt c/o medication issue:  1. Name of Medication: metoprolol tartrate (LOPRESSOR) 25 MG tablet, lisinopril (PRINIVIL,ZESTRIL) 10 MG tablet, furosemide (LASIX) 40 MG tablet, and isosorbide mononitrate (IMDUR) 30 MG 24 hr tablet  2. How are you currently taking this medication (dosage and times per day)?   3. Are you having a reaction (difficulty breathing--STAT)? no  4. What is your medication issue? Pt states she is taking a half dosage of the metformin, lisinopril, lasix twice a day and a whole pill of the isosorbide twice a day

## 2018-01-12 NOTE — Telephone Encounter (Signed)
Pt called back to let us know that she has Lisinopril 10 mg tablet and is taking 1/2 tablet daily = 5 mg daily. Pt advised Per Richardson Dopp, PA to decrease Amlodipine from 5 mg daily to 2.5 mg daily. I have corrected pt's medication list to reflect Lisinopril 10 mg tab with directions to take 1/2 tab daily = 5 mg daily as well as dose change for Amlodipine to 2.5 mg daily. I will send out new AVS to the pt. Pt thanked me for the help. Pt address verified.

## 2018-01-12 NOTE — Addendum Note (Signed)
Addended by: Michae Kava on: 01/12/2018 02:00 PM   Modules accepted: Orders

## 2018-01-20 ENCOUNTER — Ambulatory Visit: Payer: Medicare Other | Admitting: Physician Assistant

## 2018-03-24 ENCOUNTER — Other Ambulatory Visit: Payer: Self-pay | Admitting: Internal Medicine

## 2018-03-25 NOTE — Telephone Encounter (Signed)
Please review refill 

## 2018-03-30 ENCOUNTER — Emergency Department (HOSPITAL_COMMUNITY): Payer: Medicare Other

## 2018-03-30 ENCOUNTER — Other Ambulatory Visit: Payer: Self-pay

## 2018-03-30 ENCOUNTER — Emergency Department (HOSPITAL_COMMUNITY)
Admission: EM | Admit: 2018-03-30 | Discharge: 2018-03-30 | Disposition: A | Discharge status: Home / Self Care | Payer: Medicare Other | Attending: Emergency Medicine | Admitting: Emergency Medicine

## 2018-03-30 ENCOUNTER — Encounter (HOSPITAL_COMMUNITY): Payer: Self-pay

## 2018-03-30 DIAGNOSIS — Z7984 Long term (current) use of oral hypoglycemic drugs: Secondary | ICD-10-CM | POA: Insufficient documentation

## 2018-03-30 DIAGNOSIS — Z7982 Long term (current) use of aspirin: Secondary | ICD-10-CM | POA: Diagnosis not present

## 2018-03-30 DIAGNOSIS — F17200 Nicotine dependence, unspecified, uncomplicated: Secondary | ICD-10-CM | POA: Insufficient documentation

## 2018-03-30 DIAGNOSIS — Z951 Presence of aortocoronary bypass graft: Secondary | ICD-10-CM | POA: Insufficient documentation

## 2018-03-30 DIAGNOSIS — I5032 Chronic diastolic (congestive) heart failure: Secondary | ICD-10-CM | POA: Diagnosis not present

## 2018-03-30 DIAGNOSIS — Z79899 Other long term (current) drug therapy: Secondary | ICD-10-CM | POA: Insufficient documentation

## 2018-03-30 DIAGNOSIS — M546 Pain in thoracic spine: Secondary | ICD-10-CM | POA: Insufficient documentation

## 2018-03-30 DIAGNOSIS — J449 Chronic obstructive pulmonary disease, unspecified: Secondary | ICD-10-CM | POA: Insufficient documentation

## 2018-03-30 DIAGNOSIS — R0602 Shortness of breath: Secondary | ICD-10-CM | POA: Diagnosis present

## 2018-03-30 DIAGNOSIS — I252 Old myocardial infarction: Secondary | ICD-10-CM | POA: Diagnosis not present

## 2018-03-30 DIAGNOSIS — E119 Type 2 diabetes mellitus without complications: Secondary | ICD-10-CM | POA: Diagnosis not present

## 2018-03-30 DIAGNOSIS — I2510 Atherosclerotic heart disease of native coronary artery without angina pectoris: Secondary | ICD-10-CM | POA: Insufficient documentation

## 2018-03-30 DIAGNOSIS — I110 Hypertensive heart disease with heart failure: Secondary | ICD-10-CM | POA: Diagnosis not present

## 2018-03-30 LAB — BASIC METABOLIC PANEL
ANION GAP: 9 (ref 5–15)
BUN: 13 mg/dL (ref 8–23)
CHLORIDE: 108 mmol/L (ref 98–111)
CO2: 24 mmol/L (ref 22–32)
Calcium: 9.4 mg/dL (ref 8.9–10.3)
Creatinine, Ser: 1.06 mg/dL — ABNORMAL HIGH (ref 0.44–1.00)
GFR calc Af Amer: 59 mL/min — ABNORMAL LOW (ref >60)
GFR calc non Af Amer: 51 mL/min — ABNORMAL LOW (ref >60)
GLUCOSE: 115 mg/dL — AB (ref 70–99)
POTASSIUM: 4.2 mmol/L (ref 3.5–5.1)
Sodium: 141 mmol/L (ref 135–145)

## 2018-03-30 LAB — CBC
HEMATOCRIT: 45.3 % (ref 36.0–46.0)
HEMOGLOBIN: 13.9 g/dL (ref 12.0–15.0)
MCH: 28.7 pg (ref 26.0–34.0)
MCHC: 30.7 g/dL (ref 30.0–36.0)
MCV: 93.4 fL (ref 78.0–100.0)
Platelets: 194 10*3/uL (ref 150–400)
RBC: 4.85 MIL/uL (ref 3.87–5.11)
RDW: 14.3 % (ref 11.5–15.5)
WBC: 8.7 10*3/uL (ref 4.0–10.5)

## 2018-03-30 LAB — I-STAT TROPONIN, ED
Troponin i, poc: 0.01 ng/mL (ref 0.00–0.08)
Troponin i, poc: 0 ng/mL (ref 0.00–0.08)

## 2018-03-30 MED ORDER — NITROGLYCERIN 0.4 MG SL SUBL
0.4000 mg | SUBLINGUAL_TABLET | SUBLINGUAL | Status: DC | PRN
  Administered 2018-03-30 (×2): 0.4 mg via SUBLINGUAL
  Filled 2018-03-30: qty 1

## 2018-03-30 MED ORDER — IOPAMIDOL (ISOVUE-370) INJECTION 76%
INTRAVENOUS | Status: AC
  Administered 2018-03-30: 100 mL
  Filled 2018-03-30: qty 100

## 2018-03-30 MED ORDER — KETOROLAC TROMETHAMINE 30 MG/ML IJ SOLN: 30.0000 mg | Freq: Once | INTRAMUSCULAR | Status: DC

## 2018-03-30 MED ORDER — FENTANYL CITRATE (PF) 100 MCG/2ML IJ SOLN
50.0000 ug | Freq: Once | INTRAMUSCULAR | Status: AC
  Administered 2018-03-30: 50 ug via INTRAVENOUS
  Filled 2018-03-30: qty 2

## 2018-03-30 MED ORDER — IOPAMIDOL (ISOVUE-370) INJECTION 76%: 100.0000 mL | Freq: Once | INTRAVENOUS | Status: DC | PRN

## 2018-03-30 MED ORDER — KETOROLAC TROMETHAMINE 30 MG/ML IJ SOLN
15.0000 mg | Freq: Once | INTRAMUSCULAR | Status: AC
  Administered 2018-03-30: 15 mg via INTRAVENOUS
  Filled 2018-03-30: qty 1

## 2018-03-30 NOTE — ED Triage Notes (Signed)
Pt reports this morning she woke up with tingling, shortness of breath, and chest pain. Pt reports back pain as well that is still present and some shakiness.

## 2018-03-30 NOTE — Discharge Instructions (Addendum)
RETURN TO ER IMMEDIATELY IF YOU HAVE ANY CHEST PAIN, JAW/ARM PAIN, WEAKNESS IN ARM OR LEG, BREATHING PROBLEMS, OR SUDDEN WORSENING OF SYMPTOMS.

## 2018-03-30 NOTE — ED Notes (Signed)
ED Provider at bedside. 

## 2018-03-30 NOTE — ED Notes (Signed)
Patient transported to CT 

## 2018-03-30 NOTE — ED Provider Notes (Signed)
[facility] EMERGENCY DEPARTMENT Provider Note   CSN: [id number] Arrival date & time: 03/30/18  1232     History   Chief Complaint Chief Complaint  Patient presents with  . Shortness of Breath    HPI Jamie [name] is a 72 y.o. female.  72 year old female with past medical history including CAD s/p CABG, breast cancer, OSA, HTN, HLD, T2DM, COPD who p/w back pain.  This morning the patient began having pain across her mid thoracic back involving both sides that progressively worsened once it began.  She initially reported tingling in her arms but then later noted it felt like she was tingling everywhere.  The tingling has resolved but she continues to have severe upper back pain.  She moved around to try to get more comfortable but this did not improve her pain.  She notes some associated shortness of breath.  She does admit that she has been having ongoing problems with shortness of breath but has seen some improvement since increasing her isosorbide dose.  She denies any chest or jaw pain, abdominal pain, extremity weakness, numbness, nausea/vomiting, diaphoresis, trauma, or change in physical activity.  She is compliant with her medications.  The history is provided by the patient.    Past Medical History:  Diagnosis Date  . Anxiety   . Asthma   . Breast cancer ([place]) 08/2011   lt. breast ca  . CAD (coronary artery disease)    NSTEMI 5/13:  LHC demonstrated oLM 75%, pLAD 80%, RCA occluded, filled by collaterals from the LAD.  Echo 12/10/11: EF 60%.; CABG 12/13/11: LIMA-LAD, SVG-RCA, SVG-diagonal and OM.   Marland Kitchen Carotid artery disease ([place])    a.  Pre-CABG Dopplers 5/13: Bilateral 40-59%; b.  Carotid US 6/71: RICA 24-58%, LICA 09-98%, normal subclavians bilaterally >> FU 6 months;  c. Carotid US 33/82: RICA 50-53%; LICA 97-67%, normal subclavians >> FU 1 year // d. Carotid US 11/17:  Stable 40-59% bilateral ICA stenosis; F/U 1 year.  Marland Kitchen COPD (chronic obstructive pulmonary  disease) ([place])   . DM2 (diabetes mellitus, type 2) ([facility])    metformin  . HLD (hyperlipidemia)   . Hx of cardiovascular stress test    Myoview 5/16:  small area of inf-lat ischemia, EF 70%; Low Risk >> try medical Rx  . Hx of echocardiogram    Echo 5/16:  EF 60-65%, no RWMA, Gr 1 DD, Ao sclerosis, mild AS, mean 14 mmHg, MAC  . Hx of migraines   . Hypertension   . Myocardial infarction ([place])   . Obesity   . Shortness of breath   . Sleep apnea    sleep study2-3 yrs ago lost weight afterward but gained back  . UTI (lower urinary tract infection)    freq-bladder implant -removed    Patient Active Problem List   Diagnosis Date Noted  . Shortness of breath 07/18/2017  . Chronic diastolic heart failure ([place]) 07/18/2017  . Essential hypertension 07/18/2017  . Malignant neoplasm of overlapping sites of left breast in female, estrogen receptor positive ([place]) 02/16/2016  . Osteopenia 02/20/2015  . Aortic stenosis 01/26/2015  . Fatty liver disease, nonalcoholic 34/19/3790  . Nodule of left lung 11/26/2013  . Diabetes mellitus, type 2 ([place]) 08/16/2013  . OSA (obstructive sleep apnea) 01/28/2013  . Lymphedema of arm 05/07/2012  . Acquired absence of bilateral breasts and nipples 04/10/2012  . Carotid stenosis 03/11/2012  . Preoperative evaluation of a medical condition to rule out surgical contraindications (TAR required) 03/11/2012  .  Constipation 12/12/2011  . CHF (congestive heart failure) ([place]) 12/10/2011  . COPD (chronic obstructive pulmonary disease) ([place]) 12/10/2011  . Hyperlipidemia LDL goal <70 12/10/2011  . Obesity 12/10/2011  . Coronary artery disease 12/10/2011    Past Surgical History:  Procedure Laterality Date  . ABDOMINAL HYSTERECTOMY  1977  . APPENDECTOMY    . AXILLARY LYMPH NODE DISSECTION  10/18/2011   Procedure: AXILLARY LYMPH NODE DISSECTION;  Surgeon: Merrie Roof, MD;  Location: [place];  Service: General;  Laterality: Left;  . BREAST RECONSTRUCTION  10/18/2011    Procedure: BREAST RECONSTRUCTION;  Surgeon: Theodoro Kos, DO;  Location: [facility];  Service: Plastics;  Laterality: Bilateral;   bilateral breast reconstruction with bilateral tissue expander and placement of flex hd.  . CESAREAN SECTION  04/1976  . CORONARY ARTERY BYPASS GRAFT  12/13/2011   Procedure: CORONARY ARTERY BYPASS GRAFTING (CABG);  Surgeon: Gaye Pollack, MD;  Location: [facility];  Service: Open Heart Surgery;  Laterality: N/A;  Times four using endoscopically harvested left greater saphenous vein and left internal mammary artery. Right greater saphenous vein attempted; not appropriate for vein harvest.  . INTRAVASCULAR PRESSURE WIRE/FFR STUDY N/A 08/22/2017   Procedure: INTRAVASCULAR PRESSURE WIRE/FFR STUDY;  Surgeon: Nelva Bush, MD;  Location: [facility] CV LAB;  Service: Cardiovascular;  Laterality: N/A;  . LAPAROSCOPIC GASTRIC BANDING  2010  . LEFT HEART CATHETERIZATION WITH CORONARY ANGIOGRAM N/A 12/11/2011   Procedure: LEFT HEART CATHETERIZATION WITH CORONARY ANGIOGRAM;  Surgeon: Larey Dresser, MD;  Location: [facility] CATH LAB;  Service: Cardiovascular;  Laterality: N/A;  . LESION REMOVAL  04/09/2012   Procedure: LESION REMOVAL;  Surgeon: Theodoro Kos, DO;  Location: [facility];  Service: Plastics;  Laterality: Bilateral;  . MASTECTOMY W/ SENTINEL NODE BIOPSY  10/18/2011   Procedure: MASTECTOMY WITH SENTINEL LYMPH NODE BIOPSY;  Surgeon: Merrie Roof, MD;  Location: [facility];  Service: General;  Laterality: Bilateral;  bilateral mastectomy and left sentinel node biopsy  . NASAL SINUS SURGERY    . PORT-A-CATH REMOVAL  04/09/2012   Procedure: REMOVAL PORT-A-CATH;  Surgeon: Merrie Roof, MD;  Location: [facility];  Service: General;  Laterality: Right;  . PORTACATH PLACEMENT  12/02/2011   Procedure: INSERTION PORT-A-CATH;  Surgeon: Merrie Roof, MD;  Location: [facility];  Service: General;  Laterality: Right;  . RIGHT/LEFT HEART CATH AND CORONARY/GRAFT  ANGIOGRAPHY N/A 08/22/2017   Procedure: RIGHT/LEFT HEART CATH AND CORONARY/GRAFT ANGIOGRAPHY;  Surgeon: Nelva Bush, MD;  Location: [facility] CV LAB;  Service: Cardiovascular;  Laterality: N/A;  . TONSILLECTOMY    . US ECHOCARDIOGRAPHY     at [facility] NAVIGATION Left 11/26/2013   Procedure: VIDEO BRONCHOSCOPY WITH ENDOBRONCHIAL NAVIGATION;  Surgeon: Collene Gobble, MD;  Location: [facility];  Service: Thoracic;  Laterality: Left;     OB History   None      Home Medications    Prior to Admission medications   Medication Sig Start Date End Date Taking? Authorizing Provider  albuterol (PROVENTIL HFA;VENTOLIN HFA) 108 (90 Base) MCG/ACT inhaler Inhale 2 puffs into the lungs every 6 (six) hours as needed for wheezing or shortness of breath.   Yes [provider]  furosemide (LASIX) 40 MG tablet TAKE 1 TABLET BY MOUTH ONCE DAILY 03/25/18  Yes End, Harrell Gave, MD  metoprolol tartrate (LOPRESSOR) 25 MG tablet Take 1 tablet (25 mg total) by mouth 2 (two) times daily. 10/20/17 03/30/18  Yes End, Harrell Gave, MD  amLODipine (NORVASC) 2.5 MG tablet Take 1 tablet (2.5 mg total) by mouth daily. 01/12/18 04/12/18  Richardson Dopp T, PA-C  aspirin EC 81 MG tablet Take 1 tablet (81 mg total) by mouth daily. 04/26/16   Richardson Dopp T, PA-C  atorvastatin (LIPITOR) 80 MG tablet TAKE 1 TABLET BY MOUTH ONCE DAILY AT  6PM Patient taking differently: 80 mg daily.  10/23/17   Richardson Dopp T, PA-C  buPROPion (WELLBUTRIN XL) 300 MG 24 hr tablet Take 300 mg by mouth daily.  06/20/17   [provider]  cephALEXin (KEFLEX) 500 MG capsule Take 500 mg by mouth daily. abx for bladder issues  03/05/17   [provider]  Cholecalciferol (VITAMIN D3) 1000 units CAPS Take 1 capsule by mouth daily.    [provider]  fluticasone (FLONASE) 50 MCG/ACT nasal spray Place 2 sprays into both nostrils daily.  08/14/13   [provider]  furosemide  (LASIX) 40 MG tablet Take 0.5 tablets (20 mg total) by mouth daily. 08/14/17 08/09/18  Richardson Dopp T, PA-C  isosorbide mononitrate (IMDUR) 30 MG 24 hr tablet Take 3 tablets (90 mg total) by mouth daily. 01/19/18   Richardson Dopp T, PA-C  lisinopril (PRINIVIL,ZESTRIL) 10 MG tablet Take 0.5 tablets (5 mg total) by mouth daily. 01/12/18 04/12/18  Richardson Dopp T, PA-C  metFORMIN (GLUCOPHAGE) 500 MG tablet Take 1 tablet (500 mg total) by mouth 2 (two) times daily. 08/25/17   End, Harrell Gave, MD  Multiple Vitamin (MULTI VITAMIN DAILY PO) Take 1 tablet by mouth daily.    [provider]  nitroGLYCERIN (NITROSTAT) 0.4 MG SL tablet Place 1 tablet (0.4 mg total) under the tongue every 5 (five) minutes as needed. 08/13/17   Richardson Dopp T, PA-C  Polyethyl Glycol-Propyl Glycol (SYSTANE OP) Place 1 drop into both eyes 4 (four) times daily as needed (for dry eyes).    [provider]    Family History Family History  Problem Relation Age of Onset  . Heart disease Mother   . Heart disease Father   . Breast cancer Maternal Aunt   . Ovarian cancer Paternal Aunt   . Breast cancer Maternal Aunt   . Anesthesia problems Neg Hx   . Hypotension Neg Hx   . Malignant hyperthermia Neg Hx   . Pseudochol deficiency Neg Hx     Social History Social History   Tobacco Use  . Smoking status: Former Smoker    Packs/day: 2.00    Years: 40.00    Pack years: 80.00    Types: Cigarettes    Last attempt to quit: 08/25/2006    Years since quitting: 11.6  . Smokeless tobacco: Never Used  Substance Use Topics  . Alcohol use: Yes    Comment: 1-2 times a year  . Drug use: No     Allergies   Vicodin [hydrocodone-acetaminophen]; Escitalopram; Sulfa antibiotics; and Sulfamethoxazole   Review of Systems Review of Systems All other systems reviewed and are negative except that which was mentioned in HPI   Physical Exam Updated Vital Signs BP 121/82   Pulse (!) 59   Resp 18   SpO2 96%   Physical  Exam  Constitutional: She is oriented to person, place, and time. She appears well-developed and well-nourished. No distress.  HENT:  Head: Normocephalic and atraumatic.  Moist mucous membranes  Eyes: Pupils are equal, round, and reactive to light. Conjunctivae are normal.  Neck: Neck supple.  Cardiovascular: Normal rate, regular rhythm  and normal heart sounds.  No murmur heard. Pulmonary/Chest: Effort normal and breath sounds normal.  Abdominal: Soft. Bowel sounds are normal. She exhibits no distension. There is no tenderness.  Musculoskeletal: She exhibits no edema.  No midline spinal tenderness  Neurological: She is alert and oriented to person, place, and time. No sensory deficit.  Fluent speech 5/5 strength BUE  Skin: Skin is warm and dry. No rash noted.  Psychiatric: She has a normal mood and affect. Judgment normal.  Nursing note and vitals reviewed.    ED Treatments / Results  Labs (all labs ordered are listed, but only abnormal results are displayed) Labs Reviewed  BASIC METABOLIC PANEL - Abnormal; Notable for the following components:      Result Value   Glucose, Bld 115 (*)    Creatinine, Ser 1.06 (*)    GFR calc non Af Amer 51 (*)    GFR calc Af Amer 59 (*)    All other components within normal limits  CBC  I-STAT TROPONIN, ED  I-STAT TROPONIN, ED    EKG EKG Interpretation  Date/Time:  Monday March 30 2018 12:39:19 EDT Ventricular Rate:  82 PR Interval:  150 QRS Duration: 78 QT Interval:  378 QTC Calculation: 441 R Axis:   37 Text Interpretation:  Sinus rhythm with sinus arrhythmia with frequent Premature ventricular complexes Inferior infarct , age undetermined Abnormal ECG frequent PVCs new from previous Confirmed by Theotis Burrow [phone]) on 03/30/2018 1:37:11 PM   Radiology Dg Chest 2 View  Result Date: 03/30/2018 CLINICAL DATA:  Shortness of breath and chest pain EXAM: CHEST - 2 VIEW COMPARISON:  11/26/2017 FINDINGS: Cardiac shadow is enlarged.  Postsurgical changes are noted. Lungs are well aerated bilaterally without focal infiltrate or sizable effusion. Degenerative changes of thoracic spine are noted. IMPRESSION: No active cardiopulmonary disease. Electronically Signed   By: Inez Catalina M.D.   On: 03/30/2018 13:10   Ct Angio Chest Pe W/cm &/or Wo Cm  Result Date: 03/30/2018 CLINICAL DATA:  Pt c/o pain between shoulders and difficulty breathing that began around 9 am today EXAM: CT ANGIOGRAPHY CHEST WITH CONTRAST TECHNIQUE: Multidetector CT imaging of the chest was performed using the standard protocol during bolus administration of intravenous contrast. Multiplanar CT image reconstructions and MIPs were obtained to evaluate the vascular anatomy. CONTRAST:  133m ISOVUE-370 IOPAMIDOL (ISOVUE-370) INJECTION 76% COMPARISON:  Chest x-ray on 03/30/2018 FINDINGS: Cardiovascular: Status post median sternotomy and CABG. The heart is mildly enlarged. There is atherosclerotic calcification of the coronary arteries and thoracic aorta. No aneurysm. Mediastinum/Nodes: The visualized portion of the thyroid gland has a normal appearance. Esophagus is normal in appearance. Lungs/Pleura: Small calcified granuloma is identified along the periphery of the LEFT UPPER lobe on image 58 of series 7. No suspicious pulmonary nodules. No pleural effusions or consolidations. Upper Abdomen: Patient has lap band surrounding the proximal stomach, without evidence for obstruction. Somewhat tortuous appearance of the distal esophagus. It is difficult to exclude hiatal hernia. Gallbladder is present. Musculoskeletal: Median sternotomy changes. No acute osseous abnormalities. Additional: The patient has bilateral retropectoral implants. Surgical clips are noted in the LEFT axilla. Review of the MIP images confirms the above findings. IMPRESSION: 1. Technically adequate exam showing no acute pulmonary embolus. 2. Mild cardiomegaly; coronary artery disease and CABG. 3.  Aortic  atherosclerosis.  (ICD10-I70.0) 4. Bilateral breast implants.  LEFT axillary node dissection. 5. No acute pulmonary abnormality. Electronically Signed   By: ENolon NationsM.D.   On: 03/30/2018 16:13  Procedures Procedures (including critical care time)  Medications Ordered in ED Medications  nitroGLYCERIN (NITROSTAT) SL tablet 0.4 mg (0.4 mg Sublingual Given 03/30/18 1443)  iopamidol (ISOVUE-370) 76 % injection 100 mL (has no administration in time range)  fentaNYL (SUBLIMAZE) injection 50 mcg (50 mcg Intravenous Given 03/30/18 1447)  iopamidol (ISOVUE-370) 76 % injection (100 mLs  Contrast Given 03/30/18 1541)  ketorolac (TORADOL) 30 MG/ML injection 15 mg (15 mg Intravenous Given 03/30/18 1737)     Initial Impression / Assessment and Plan / ED Course  I have reviewed the triage vital signs and the nursing notes.  Pertinent labs & imaging results that were available during my care of the patient were reviewed by me and considered in my medical decision making (see chart for details).    VS stable and EKG reassuring, no acute ischemic changes. She had frequent PVC on EKG but not on monitor during evaluation.  Lab work showed negative serial troponins, reassuring BMP and CBC.  Chest x-ray negative acute.  She does have history of breast cancer and given sudden onset of symptoms, obtain CTA of chest to evaluate for PE.  CTA was negative for PE or acute pulmonary process.  I also see no abnormal findings of her thoracic spine.  She has no neurologic deficits to suggest acute spinal process.  She has continued to deny any chest or abdominal pain to suggest ACS or aortic dissection.  I have discussed supportive measures and instructed to see PCP in 2 days for reassessment.  I have extensively reviewed return precautions with the patient and her husband including chest pain, jaw pain, extremity weakness, vomiting, diaphoresis, or any sudden changes in symptoms.  She voiced understanding.  Final  Clinical Impressions(s) / ED Diagnoses   Final diagnoses:  Acute bilateral thoracic back pain    ED Discharge Orders    None       Little, Wenda Overland, MD 03/30/18 1754

## 2018-05-03 ENCOUNTER — Encounter (HOSPITAL_COMMUNITY): Payer: Self-pay | Admitting: Emergency Medicine

## 2018-05-03 ENCOUNTER — Emergency Department (HOSPITAL_COMMUNITY)
Admission: EM | Admit: 2018-05-03 | Discharge: 2018-05-03 | Disposition: A | Discharge status: Home / Self Care | Payer: Medicare Other | Attending: Emergency Medicine | Admitting: Emergency Medicine

## 2018-05-03 ENCOUNTER — Other Ambulatory Visit: Payer: Self-pay

## 2018-05-03 ENCOUNTER — Emergency Department (HOSPITAL_COMMUNITY): Payer: Medicare Other

## 2018-05-03 DIAGNOSIS — R110 Nausea: Secondary | ICD-10-CM | POA: Insufficient documentation

## 2018-05-03 DIAGNOSIS — I252 Old myocardial infarction: Secondary | ICD-10-CM | POA: Diagnosis not present

## 2018-05-03 DIAGNOSIS — E119 Type 2 diabetes mellitus without complications: Secondary | ICD-10-CM | POA: Diagnosis not present

## 2018-05-03 DIAGNOSIS — I110 Hypertensive heart disease with heart failure: Secondary | ICD-10-CM | POA: Insufficient documentation

## 2018-05-03 DIAGNOSIS — K5900 Constipation, unspecified: Secondary | ICD-10-CM | POA: Diagnosis not present

## 2018-05-03 DIAGNOSIS — K8050 Calculus of bile duct without cholangitis or cholecystitis without obstruction: Secondary | ICD-10-CM | POA: Insufficient documentation

## 2018-05-03 DIAGNOSIS — Z7984 Long term (current) use of oral hypoglycemic drugs: Secondary | ICD-10-CM | POA: Diagnosis not present

## 2018-05-03 DIAGNOSIS — Z79899 Other long term (current) drug therapy: Secondary | ICD-10-CM | POA: Insufficient documentation

## 2018-05-03 DIAGNOSIS — Z87891 Personal history of nicotine dependence: Secondary | ICD-10-CM | POA: Diagnosis not present

## 2018-05-03 DIAGNOSIS — I5032 Chronic diastolic (congestive) heart failure: Secondary | ICD-10-CM | POA: Diagnosis not present

## 2018-05-03 DIAGNOSIS — Z853 Personal history of malignant neoplasm of breast: Secondary | ICD-10-CM | POA: Insufficient documentation

## 2018-05-03 DIAGNOSIS — Z7982 Long term (current) use of aspirin: Secondary | ICD-10-CM | POA: Diagnosis not present

## 2018-05-03 DIAGNOSIS — J449 Chronic obstructive pulmonary disease, unspecified: Secondary | ICD-10-CM | POA: Diagnosis not present

## 2018-05-03 DIAGNOSIS — R1011 Right upper quadrant pain: Secondary | ICD-10-CM | POA: Diagnosis present

## 2018-05-03 DIAGNOSIS — I2510 Atherosclerotic heart disease of native coronary artery without angina pectoris: Secondary | ICD-10-CM | POA: Insufficient documentation

## 2018-05-03 DIAGNOSIS — Z951 Presence of aortocoronary bypass graft: Secondary | ICD-10-CM | POA: Diagnosis not present

## 2018-05-03 LAB — URINALYSIS, ROUTINE W REFLEX MICROSCOPIC
Bilirubin Urine: NEGATIVE
Glucose, UA: NEGATIVE mg/dL
Hgb urine dipstick: NEGATIVE
Ketones, ur: NEGATIVE mg/dL
Leukocytes, UA: NEGATIVE
Nitrite: NEGATIVE
Protein, ur: NEGATIVE mg/dL
SPECIFIC GRAVITY, URINE: 1.019 (ref 1.005–1.030)
pH: 5 (ref 5.0–8.0)

## 2018-05-03 LAB — CBC
HCT: 40.3 % (ref 36.0–46.0)
HEMOGLOBIN: 12.5 g/dL (ref 12.0–15.0)
MCH: 28.5 pg (ref 26.0–34.0)
MCHC: 31 g/dL (ref 30.0–36.0)
MCV: 91.8 fL (ref 78.0–100.0)
Platelets: 165 10*3/uL (ref 150–400)
RBC: 4.39 MIL/uL (ref 3.87–5.11)
RDW: 13.4 % (ref 11.5–15.5)
WBC: 5.9 10*3/uL (ref 4.0–10.5)

## 2018-05-03 LAB — COMPREHENSIVE METABOLIC PANEL
ALK PHOS: 69 U/L (ref 38–126)
ALT: 16 U/L (ref 0–44)
ANION GAP: 8 (ref 5–15)
AST: 19 U/L (ref 15–41)
Albumin: 3.3 g/dL — ABNORMAL LOW (ref 3.5–5.0)
BUN: 19 mg/dL (ref 8–23)
CALCIUM: 8.7 mg/dL — AB (ref 8.9–10.3)
CO2: 24 mmol/L (ref 22–32)
Chloride: 108 mmol/L (ref 98–111)
Creatinine, Ser: 1.11 mg/dL — ABNORMAL HIGH (ref 0.44–1.00)
GFR calc non Af Amer: 48 mL/min — ABNORMAL LOW (ref >60)
GFR, EST AFRICAN AMERICAN: 56 mL/min — AB (ref >60)
Glucose, Bld: 116 mg/dL — ABNORMAL HIGH (ref 70–99)
POTASSIUM: 3.6 mmol/L (ref 3.5–5.1)
Sodium: 140 mmol/L (ref 135–145)
TOTAL PROTEIN: 6.3 g/dL — AB (ref 6.5–8.1)
Total Bilirubin: 0.5 mg/dL (ref 0.3–1.2)

## 2018-05-03 LAB — LIPASE, BLOOD: Lipase: 33 U/L (ref 11–51)

## 2018-05-03 LAB — TROPONIN I

## 2018-05-03 MED ORDER — OXYCODONE-ACETAMINOPHEN 5-325 MG PO TABS: 1.0000 | ORAL_TABLET | Freq: Four times a day (QID) | ORAL | 0 refills | Status: DC | PRN

## 2018-05-03 MED ORDER — ONDANSETRON 4 MG PO TBDP: ORAL_TABLET | ORAL | 0 refills | Status: DC

## 2018-05-03 NOTE — ED Provider Notes (Signed)
[facility] EMERGENCY DEPARTMENT Provider Note   CSN: [id number] Arrival date & time: 05/03/18  0813     History   Chief Complaint Chief Complaint  Patient presents with  . Abdominal Pain  . Back Pain    HPI Jamie [name] is a 72 y.o. female.  HPI Patient states she last ate at 5:00 yesterday evening.  She had a barbecue sandwich.  States she woke up this morning at 4 AM with right upper quadrant abdominal pain.  This radiated through to her thoracic back.  Associated with nausea.  No fever or chills.  No chest pain or shortness of breath.  Patient has intermittent constipation which is unchanged.  No recent melanotic or grossly bloody stools.  Denies any urinary symptoms.  States pain is currently resolved at this time.  No previous history of the same. Past Medical History:  Diagnosis Date  . Anxiety   . Asthma   . Breast cancer ([place]) 08/2011   lt. breast ca  . CAD (coronary artery disease)    NSTEMI 5/13:  LHC demonstrated oLM 75%, pLAD 80%, RCA occluded, filled by collaterals from the LAD.  Echo 12/10/11: EF 60%.; CABG 12/13/11: LIMA-LAD, SVG-RCA, SVG-diagonal and OM.   Marland Kitchen Carotid artery disease ([place])    a.  Pre-CABG Dopplers 5/13: Bilateral 40-59%; b.  Carotid US 9/89: RICA 21-19%, LICA 41-74%, normal subclavians bilaterally >> FU 6 months;  c. Carotid US 08/14: RICA 48-18%; LICA 56-31%, normal subclavians >> FU 1 year // d. Carotid US 11/17:  Stable 40-59% bilateral ICA stenosis; F/U 1 year.  Marland Kitchen COPD (chronic obstructive pulmonary disease) ([place])   . DM2 (diabetes mellitus, type 2) (H[facility])    metformin  . HLD (hyperlipidemia)   . Hx of cardiovascular stress test    Myoview 5/16:  small area of inf-lat ischemia, EF 70%; Low Risk >> try medical Rx  . Hx of echocardiogram    Echo 5/16:  EF 60-65%, no RWMA, Gr 1 DD, Ao sclerosis, mild AS, mean 14 mmHg, MAC  . Hx of migraines   . Hypertension   . Myocardial infarction ([place])   . Obesity   . Shortness of breath     . Sleep apnea    sleep study2-3 yrs ago lost weight afterward but gained back  . UTI (lower urinary tract infection)    freq-bladder implant -removed    Patient Active Problem List   Diagnosis Date Noted  . Shortness of breath 07/18/2017  . Chronic diastolic heart failure ([place]) 07/18/2017  . Essential hypertension 07/18/2017  . 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 Osteopenia 02/20/2015  . Aortic stenosis 01/26/2015  . Fatty liver disease, nonalcoholic 49/70/2637  . Nodule of left lung 11/26/2013  . Diabetes mellitus, type 2 ([place]) 08/16/2013  . OSA (obstructive sleep apnea) 01/28/2013  . Lymphedema of arm 05/07/2012  . Acquired absence of bilateral breasts and nipples 04/10/2012  . Carotid stenosis 03/11/2012  . Preoperative evaluation of a medical condition to rule out surgical contraindications (TAR required) 03/11/2012  . Constipation 12/12/2011  . CHF (congestive heart failure) ([place]) 12/10/2011  . COPD (chronic obstructive pulmonary disease) ([place]) 12/10/2011  . Hyperlipidemia LDL goal <70 12/10/2011  . Obesity 12/10/2011  . Coronary artery disease 12/10/2011    Past Surgical History:  Procedure Laterality Date  . ABDOMINAL HYSTERECTOMY  1977  . APPENDECTOMY    . AXILLARY LYMPH NODE DISSECTION  10/18/2011  Procedure: AXILLARY LYMPH NODE DISSECTION;  Surgeon: Merrie Roof, MD;  Location: [facility];  Service: General;  Laterality: Left;  . BREAST RECONSTRUCTION  10/18/2011   Procedure: BREAST RECONSTRUCTION;  Surgeon: Theodoro Kos, DO;  Location: [facility];  Service: Plastics;  Laterality: Bilateral;   bilateral breast reconstruction with bilateral tissue expander and placement of flex hd.  . CESAREAN SECTION  04/1976  . CORONARY ARTERY BYPASS GRAFT  12/13/2011   Procedure: CORONARY ARTERY BYPASS GRAFTING (CABG);  Surgeon: Gaye Pollack, MD;  Location: [facility];  Service: Open Heart Surgery;  Laterality: N/A;   Times four using endoscopically harvested left greater saphenous vein and left internal mammary artery. Right greater saphenous vein attempted; not appropriate for vein harvest.  . INTRAVASCULAR PRESSURE WIRE/FFR STUDY N/A 08/22/2017   Procedure: INTRAVASCULAR PRESSURE WIRE/FFR STUDY;  Surgeon: Nelva Bush, MD;  Location: [facility] CV LAB;  Service: Cardiovascular;  Laterality: N/A;  . LAPAROSCOPIC GASTRIC BANDING  2010  . LEFT HEART CATHETERIZATION WITH CORONARY ANGIOGRAM N/A 12/11/2011   Procedure: LEFT HEART CATHETERIZATION WITH CORONARY ANGIOGRAM;  Surgeon: Larey Dresser, MD;  Location[facility] CATH [facility];  Service: Cardiovascular;  Laterality: N/A;  . LESION REMOVAL  04/09/2012   Procedure: LESION REMOVAL;  Surgeon: Theodoro Kos, DO;  Location: [facility];  Service: Plastics;  Laterality: Bilateral;  . MASTECTOMY W/ SENTINEL NODE BIOPSY  10/18/2011   Procedure: MASTECTOMY WITH SENTINEL LYMPH NODE BIOPSY;  Surgeon: Merrie Roof, MD;  Location: [facility];  Service: General;  Laterality: Bilateral;  bilateral mastectomy and left sentinel node biopsy  . NASAL SINUS SURGERY    . PORT-A-CATH REMOVAL  04/09/2012   Procedure: REMOVAL PORT-A-CATH;  Surgeon: Merrie Roof, MD;  Location: [facility];  Service: General;  Laterality: Right;  . PORTACATH PLACEMENT  12/02/2011   Procedure: INSERTION PORT-A-CATH;  Surgeon: Merrie Roof, MD;  Location: [facility];  Service: General;  Laterality: Right;  . RIGHT/LEFT HEART CATH AND CORONARY/GRAFT ANGIOGRAPHY N/A 08/22/2017   Procedure: RIGHT/LEFT HEART CATH AND CORONARY/GRAFT ANGIOGRAPHY;  Surgeon: Nelva Bush, MD;  Location: [facility] CV LAB;  Service: Cardiovascular;  Laterality: N/A;  . TONSILLECTOMY    . US ECHOCARDIOGRAPHY     at [place] NAVIGATION Left 11/26/2013   Procedure: VIDEO BRONCHOSCOPY WITH ENDOBRONCHIAL NAVIGATION;  Surgeon: Collene Gobble, MD;  Location: [facility];   Service: Thoracic;  Laterality: Left;     OB History   None      Home Medications    Prior to Admission medications   Medication Sig Start Date End Date Taking? Authorizing Provider  albuterol (PROVENTIL HFA;VENTOLIN HFA) 108 (90 Base) MCG/ACT inhaler Inhale 2 puffs into the lungs every 6 (six) hours as needed for wheezing or shortness of breath.   Yes [provider]  albuterol (PROVENTIL) (2.5 MG/3ML) 0.083% nebulizer solution Inhale 3 mLs into the lungs every 6 (six) hours as needed for wheezing or shortness of breath. 02/19/18  Yes [provider]  amLODipine (NORVASC) 2.5 MG tablet Take 1 tablet (2.5 mg total) by mouth daily. 01/12/18 05/03/18 Yes Weaver, Scott T, PA-C  aspirin EC 81 MG tablet Take 1 tablet (81 mg total) by mouth daily. 04/26/16  Yes Weaver, Scott T, PA-C  atorvastatin (LIPITOR) 80 MG tablet TAKE 1 TABLET BY MOUTH ONCE DAILY AT  6PM Patient taking differently: 80 mg daily.  10/23/17  Yes Richardson Dopp T, PA-C  buPROPion (WELLBUTRIN XL) 300 MG 24 hr tablet Take 300 mg by mouth daily.  06/20/17  Yes [provider]  cephALEXin (KEFLEX) 500 MG capsule Take 500 mg by mouth daily. abx for bladder issues  03/05/17  Yes [provider]  Cholecalciferol (VITAMIN D3) 1000 units CAPS Take 1 capsule by mouth daily.   Yes [provider]  fluticasone (FLONASE) 50 MCG/ACT nasal spray Place 2 sprays into both nostrils daily.  08/14/13  Yes [provider]  furosemide (LASIX) 40 MG tablet Take 0.5 tablets (20 mg total) by mouth daily. 08/14/17 08/09/18 Yes Weaver, Scott T, PA-C  isosorbide mononitrate (IMDUR) 30 MG 24 hr tablet Take 3 tablets (90 mg total) by mouth daily. Patient taking differently: Take 60 mg by mouth daily.  01/19/18  Yes Weaver, Scott T, PA-C  lisinopril (PRINIVIL,ZESTRIL) 10 MG tablet Take 0.5 tablets (5 mg total) by mouth daily. 01/12/18 05/03/18 Yes Weaver, Scott T, PA-C  loratadine (CLARITIN) 10 MG tablet Take 10 mg by  mouth daily.   Yes [provider]  metFORMIN (GLUCOPHAGE) 500 MG tablet Take 1 tablet (500 mg total) by mouth 2 (two) times daily. Patient taking differently: Take 500 mg by mouth daily.  08/25/17  Yes End, Harrell Gave, MD  metoprolol tartrate (LOPRESSOR) 25 MG tablet Take 1 tablet (25 mg total) by mouth 2 (two) times daily. 10/20/17 05/03/18 Yes End, Harrell Gave, MD  Multiple Vitamin (MULTI VITAMIN DAILY PO) Take 1 tablet by mouth daily.   Yes [provider]  nitroGLYCERIN (NITROSTAT) 0.4 MG SL tablet Place 1 tablet (0.4 mg total) under the tongue every 5 (five) minutes as needed. 08/13/17  Yes Weaver, Scott T, PA-C  Polyethyl Glycol-Propyl Glycol (SYSTANE OP) Place 1 drop into both eyes 4 (four) times daily as needed (for dry eyes).   Yes [provider]  furosemide (LASIX) 40 MG tablet TAKE 1 TABLET BY MOUTH ONCE DAILY Patient not taking: Reported on 05/03/2018 03/25/18   End, Harrell Gave, MD  ondansetron (ZOFRAN ODT) 4 MG disintegrating tablet 79m ODT q4 hours prn nausea/vomit 05/03/18   YJulianne Rice MD  oxyCODONE-acetaminophen (PERCOCET/ROXICET) 5-325 MG tablet Take 1 tablet by mouth every 6 (six) hours as needed for severe pain. 05/03/18   YJulianne Rice MD    Family History Family History  Problem Relation Age of Onset  . Heart disease Mother   . Heart disease Father   . Breast cancer Maternal Aunt   . Ovarian cancer Paternal Aunt   . Breast cancer Maternal Aunt   . Anesthesia problems Neg Hx   . Hypotension Neg Hx   . Malignant hyperthermia Neg Hx   . Pseudochol deficiency Neg Hx     Social History Social History   Tobacco Use  . Smoking status: Former Smoker    Packs/day: 2.00    Years: 40.00    Pack years: 80.00    Types: Cigarettes    Last attempt to quit: 08/25/2006    Years since quitting: 11.6  . Smokeless tobacco: Never Used  Substance Use Topics  . Alcohol use: Yes    Comment: 1-2 times a year  . Drug use: No     Allergies     Vicodin [hydrocodone-acetaminophen]; Escitalopram; Sulfa antibiotics; and Sulfamethoxazole   Review of Systems Review of Systems  Constitutional: Negative for chills and fever.  Respiratory: Negative for cough and shortness of breath.   Cardiovascular: Negative for chest pain, palpitations and leg swelling.  Gastrointestinal: Positive for abdominal pain, constipation and  nausea. Negative for diarrhea and vomiting.  Genitourinary: Negative for dysuria, flank pain, frequency and hematuria.  Musculoskeletal: Positive for back pain. Negative for neck pain and neck stiffness.  Skin: Negative for rash and wound.  Neurological: Negative for dizziness, weakness, light-headedness, numbness and headaches.  All other systems reviewed and are negative.    Physical Exam Updated Vital Signs BP (!) 155/76 (BP Location: Right Wrist)   Pulse 71   Temp 98.4 F (36.9 C) (Oral)   Resp 20   Ht 5' 5.5" (1.664 m)   Wt 108.9 kg   SpO2 98%   BMI 39.33 kg/m   Physical Exam  Constitutional: She is oriented to person, place, and time. She appears well-developed and well-nourished. No distress.  HENT:  Head: Normocephalic and atraumatic.  Mouth/Throat: Oropharynx is clear and moist. No oropharyngeal exudate.  Eyes: Pupils are equal, round, and reactive to light. EOM are normal.  Neck: Normal range of motion. Neck supple. No JVD present.  Cardiovascular: Normal rate and regular rhythm. Exam reveals no gallop and no friction rub.  No murmur heard. Pulmonary/Chest: Effort normal and breath sounds normal. No stridor. No respiratory distress. She has no wheezes. She has no rales. She exhibits no tenderness.  Abdominal: Soft. Bowel sounds are normal. There is tenderness. There is no rebound and no guarding.  Very mild right upper quadrant tenderness to palpation without rebound or guarding.  Musculoskeletal: Normal range of motion. She exhibits no edema or tenderness.  No CVA tenderness bilaterally.  No  midline thoracic or lumbar tenderness.  No lower extremity swelling, asymmetry or tenderness.  Lymphadenopathy:    She has no cervical adenopathy.  Neurological: She is alert and oriented to person, place, and time.  Moving all extremities without focal deficit.  Sensation fully intact.  Skin: Skin is warm and dry. Capillary refill takes less than 2 seconds. No rash noted. She is not diaphoretic. No erythema.  Psychiatric: She has a normal mood and affect. Her behavior is normal.  Nursing note and vitals reviewed.    ED Treatments / Results  Labs (all labs ordered are listed, but only abnormal results are displayed) Labs Reviewed  COMPREHENSIVE METABOLIC PANEL - Abnormal; Notable for the following components:      Result Value   Glucose, Bld 116 (*)    Creatinine, Ser 1.11 (*)    Calcium 8.7 (*)    Total Protein 6.3 (*)    Albumin 3.3 (*)    GFR calc non Af Amer 48 (*)    GFR calc Af Amer 56 (*)    All other components within normal limits  LIPASE, BLOOD  CBC  URINALYSIS, ROUTINE W REFLEX MICROSCOPIC  TROPONIN I    EKG EKG Interpretation  Date/Time:  Sunday May 03 2018 08:20:58 EDT Ventricular Rate:  71 PR Interval:  160 QRS Duration: 78 QT Interval:  392 QTC Calculation: 425 R Axis:   17 Text Interpretation:  Normal sinus rhythm Inferior infarct , age undetermined Anterior infarct , age undetermined Abnormal ECG Confirmed by Julianne Rice [phone]) on 05/03/2018 12:22:04 PM   Radiology US Abdomen Limited  Result Date: 05/03/2018 CLINICAL DATA:  Right upper quadrant pain. History of breast carcinoma EXAM: ULTRASOUND ABDOMEN LIMITED RIGHT UPPER QUADRANT COMPARISON:  None. FINDINGS: Gallbladder: Within the gallbladder, there are multiple small echogenic foci which move and shadow consistent with cholelithiasis. Largest individual gallstone measures 2 mm in length. There is no gallbladder wall thickening or pericholecystic fluid. No sonographic Murphy sign noted by  sonographer. Common bile duct: Diameter: 3 mm. No intrahepatic or extrahepatic biliary duct dilatation evident. Liver: No focal lesion identified. Liver echogenicity overall is increased. Portal vein is patent on color Doppler imaging with normal direction of blood flow towards the liver. IMPRESSION: 1. Cholelithiasis. No gallbladder wall thickening or pericholecystic fluid. 2. Increase in liver echogenicity, a finding felt to be indicative of hepatic steatosis. While no focal liver lesions are evident on this study, it must be cautioned that the sensitivity of ultrasound for detection of focal liver lesions is diminished in this circumstance. Electronically Signed   By: Lowella Grip III M.D.   On: 05/03/2018 10:20    Procedures Procedures (including critical care time)  Medications Ordered in ED Medications - No data to display   Initial Impression / Assessment and Plan / ED Course  I have reviewed the triage vital signs and the nursing notes.  Pertinent labs & imaging results that were available during my care of the patient were reviewed by me and considered in my medical decision making (see chart for details).     Patient pain has completely resolved.  LFTs and lipase are within normal limits.  Normal white blood cell count.  Ultrasound of the abdomen with evidence of cholelithiasis and no evidence of cholecystitis.  Advised to follow-up closely with general surgery.  Strict return precautions have been given. Final Clinical Impressions(s) / ED Diagnoses   Final diagnoses:  Biliary colic    ED Discharge Orders         Ordered    oxyCODONE-acetaminophen (PERCOCET/ROXICET) 5-325 MG tablet  Every 6 hours PRN     05/03/18 1220    ondansetron (ZOFRAN ODT) 4 MG disintegrating tablet     05/03/18 1220           Julianne Rice, MD 05/03/18 1222

## 2018-05-03 NOTE — ED Triage Notes (Signed)
Pt. Stated, I woke up this morning and I had a terrible stomach and back pain, and some nausea.

## 2018-05-03 NOTE — ED Notes (Signed)
Pt aware of need for UA, toileting offered. Pt declines to go to the restroom, states she went just prior to coming to the ED.

## 2018-05-03 NOTE — Discharge Instructions (Addendum)
Call and make an appointment to follow-up with general surgery.  Return for persistent pain, vomiting, fever or any concerns.

## 2018-05-03 NOTE — ED Notes (Signed)
Patient verbalizes understanding of discharge instructions. Opportunity for questioning and answers were provided. Armband removed by staff, pt discharged from ED in wheelchair.  

## 2018-06-11 ENCOUNTER — Other Ambulatory Visit: Payer: Self-pay | Admitting: Surgery

## 2018-06-15 ENCOUNTER — Telehealth: Payer: Self-pay | Admitting: *Deleted

## 2018-06-15 NOTE — Telephone Encounter (Signed)
   Primary Cardiologist: Nelva Bush, MD  Chart reviewed as part of pre-operative protocol coverage. Patient was contacted 06/15/2018 in reference to pre-operative risk assessment for pending surgery as outlined below.  Jamie [name] was last seen on 01/12/18 by Richardson Dopp.  Since that day, Jamie [name] has done well after medication adjustment. Currently able to get 6.05 mets of activity.   Therefore, based on ACC/AHA guidelines, the patient would be at acceptable risk for the planned procedure without further cardiovascular testing.   Medical management is recommended after last cath 08/2017. No clear request of holding ASA. Per patient, surgeon likely going to keep on ASA. Will ask Dr. Saunders Revel to review and give recommendations.   Please forward your response to [facility] PREOP.   Thank you   Leanor Kail, PA 06/15/2018, 2:35 PM

## 2018-06-15 NOTE — Telephone Encounter (Signed)
   [facility] Pre-operative Risk Assessment    Request for surgical clearance:  1. What type of surgery is being performed? LAPAROSCOPIC CHOLECYSTECTOMY     2. When is this surgery scheduled? TBD   3. Are there any medications that need to be held prior to surgery and how long? PT IS ON ASA   4. Practice name and name of physician performing surgery? [facility]; DR. Coralie Keens   5. What is your office phone and fax number? [facility] # [id number]; FAX # [phone] ;ATT: Malachi Bonds, CMA   6. Anesthesia type (None, local, MAC, general) ? GENERAL    Julaine Hua 06/15/2018, 9:53 AM  _________________________________________________________________   (provider comments below)

## 2018-06-17 NOTE — Telephone Encounter (Signed)
The patient has a complex cardiac history with significant shortness of breath in the past (anginal equivalent).  She was last seen by Richardson Dopp in June, at which time 3 month f/u was recommended.  It has now been 5 months since we last saw her.  I recommend that she be seen in the office to reassess her symptoms before proceeding with elective surgery.

## 2018-06-17 NOTE — Telephone Encounter (Signed)
Called pt to schedule appt with VB on Nov 19 for surgical clearance.

## 2018-06-17 NOTE — Telephone Encounter (Signed)
Dr. Saunders Revel has recommend that patient have OV prior to proceeding with surgery.   Burtis Junes, RN, [facility] [address] [place], [place]  [phone]

## 2018-06-22 ENCOUNTER — Encounter: Payer: Self-pay | Admitting: Internal Medicine

## 2018-06-22 ENCOUNTER — Ambulatory Visit (INDEPENDENT_AMBULATORY_CARE_PROVIDER_SITE_OTHER): Payer: Medicare Other | Admitting: Internal Medicine

## 2018-06-22 VITALS — BP 112/58 | HR 85 | Ht 65.5 in | Wt 247.0 lb

## 2018-06-22 DIAGNOSIS — E785 Hyperlipidemia, unspecified: Secondary | ICD-10-CM | POA: Diagnosis not present

## 2018-06-22 DIAGNOSIS — I10 Essential (primary) hypertension: Secondary | ICD-10-CM

## 2018-06-22 DIAGNOSIS — I25119 Atherosclerotic heart disease of native coronary artery with unspecified angina pectoris: Secondary | ICD-10-CM

## 2018-06-22 DIAGNOSIS — K8020 Calculus of gallbladder without cholecystitis without obstruction: Secondary | ICD-10-CM | POA: Insufficient documentation

## 2018-06-22 DIAGNOSIS — I25118 Atherosclerotic heart disease of native coronary artery with other forms of angina pectoris: Secondary | ICD-10-CM

## 2018-06-22 MED ORDER — NITROGLYCERIN 0.4 MG SL SUBL: 0.4000 mg | SUBLINGUAL_TABLET | SUBLINGUAL | 3 refills | Status: AC | PRN

## 2018-06-22 NOTE — Progress Notes (Signed)
Follow-up Outpatient Visit Date: 06/22/2018  Primary Care Provider: Verdell Carmine., MD [address] [place] [address]  Chief Complaint: Shortness of breath and abdominal pain  HPI:  Jamie [name] is a 72 y.o. year-old female with history of coronary artery disease status post CABG (9323), diastolic heart failure, hyperlipidemia, carotid artery stenosis, breast cancer status post bilateral mastectomies, diabetes mellitus, COPD, sleep apnea, and obesity, who presents for follow-up of shortness of breath.  She was last seen by Richardson Dopp, PA, in June.  At that time, she continued to complain of shortness of breath, though she had experienced several upper respiratory tract infections leading up to that visit.  She noted that her breathing had improved with escalation of isosorbide mononitrate.  She presented to the ED in late September with right upper quadrant abdominal pain and associated nausea.  She was diagnosed with cholelithiasis and is contemplating cholecystectomy with Dr. Rush Farmer in the near future.  Today, [name] reports that she has continued to have bouts of right upper quadrant pain, most recently 1-2 weeks ago.  She denies chest pain, edema, palpitations, and lightheadedness.  Her shortness of breath improved significantly after switching from isosorbide mononitrate 30 mg BID to 60 mg daily.  Over the last week or two, she has noticed increased shortness of breath with activity and cough, concerning for recurrent bronchitis.  She decided to increase isosorbide mononitrate to 75 mg daily as well to see if that would help her shortness of breath.  --------------------------------------------------------------------------------------------------  Past Medical History:  Diagnosis Date  . Anxiety   . Asthma   . Breast cancer ([place]) 08/2011   lt. breast ca  . CAD (coronary artery disease)    NSTEMI 5/13:  LHC demonstrated oLM 75%, pLAD 80%, RCA occluded, filled by  collaterals from the LAD.  Echo 12/10/11: EF 60%.; CABG 12/13/11: LIMA-LAD, SVG-RCA, SVG-diagonal and OM.   Marland Kitchen Carotid artery disease ([place])    a.  Pre-CABG [place] 5/13: Bilateral 40-59%; b.  Carotid US 5/57: RICA 32-20%, LICA 25-42%, normal subclavians bilaterally >> FU 6 months;  c. Carotid US 70/62: RICA 37-62%; LICA 83-15%, normal subclavians >> FU 1 year // d. Carotid US 11/17:  Stable 40-59% bilateral ICA stenosis; F/U 1 year.  Marland Kitchen COPD (chronic obstructive pulmonary disease) ([place])   . DM2 (diabetes mellitus, type 2) ([facility])    metformin  . HLD (hyperlipidemia)   . Hx of cardiovascular stress test    Myoview 5/16:  small area of inf-lat ischemia, EF 70%; Low Risk >> try medical Rx  . Hx of echocardiogram    Echo 5/16:  EF 60-65%, no RWMA, Gr 1 DD, Ao sclerosis, mild AS, mean 14 mmHg, MAC  . Hx of migraines   . Hypertension   . Myocardial infarction ([facility])   . Obesity   . Shortness of breath   . Sleep apnea    sleep study2-3 yrs ago lost weight afterward but gained back  . UTI (lower urinary tract infection)    freq-bladder implant -removed   Past Surgical History:  Procedure Laterality Date  . ABDOMINAL HYSTERECTOMY  1977  . APPENDECTOMY    . AXILLARY LYMPH NODE DISSECTION  10/18/2011   Procedure: AXILLARY LYMPH NODE DISSECTION;  Surgeon: Merrie Roof, MD;  Location: [facility];  Service: General;  Laterality: Left;  . BREAST RECONSTRUCTION  10/18/2011   Procedure: BREAST RECONSTRUCTION;  Surgeon: Theodoro Kos, DO;  Location: [facility];  Service: Plastics;  Laterality: Bilateral;  bilateral breast reconstruction with bilateral tissue expander and placement of flex hd.  . CESAREAN SECTION  04/1976  . CORONARY ARTERY BYPASS GRAFT  12/13/2011   Procedure: CORONARY ARTERY BYPASS GRAFTING (CABG);  Surgeon: Gaye Pollack, MD;  Location: [facility];  Service: Open Heart Surgery;  Laterality: N/A;  Times four using endoscopically harvested left greater saphenous vein and left internal mammary artery. Right  greater saphenous vein attempted; not appropriate for vein harvest.  . INTRAVASCULAR PRESSURE WIRE/FFR STUDY N/A 08/22/2017   Procedure: INTRAVASCULAR PRESSURE WIRE/FFR STUDY;  Surgeon: Nelva Bush, MD;  Location: [facility];  Service: Cardiovascular;  Laterality: N/A;  . LAPAROSCOPIC GASTRIC BANDING  2010  . LEFT HEART CATHETERIZATION WITH CORONARY ANGIOGRAM N/A 12/11/2011   Procedure: LEFT HEART CATHETERIZATION WITH CORONARY ANGIOGRAM;  Surgeon: Larey Dresser, MD;  Location: [facility] C[facility];  Service: Cardiovascular;  Laterality: N/A;  . LESION REMOVAL  04/09/2012   Procedure: LESION REMOVAL;  Surgeon: Theodoro Kos, DO;  Location: [facility];  Service: Plastics;  Laterality: Bilateral;  . MASTECTOMY W/ SENTINEL NODE BIOPSY  10/18/2011   Procedure: MASTECTOMY WITH SENTINEL LYMPH NODE BIOPSY;  Surgeon: Merrie Roof, MD;  Location: [facility];  Service: General;  Laterality: Bilateral;  bilateral mastectomy and left sentinel node biopsy  . NASAL SINUS SURGERY    . PORT-A-CATH REMOVAL  04/09/2012   Procedure: REMOVAL PORT-A-CATH;  Surgeon: Merrie Roof, MD;  Location: [facility];  Service: General;  Laterality: Right;  . PORTACATH PLACEMENT  12/02/2011   Procedure: INSERTION PORT-A-CATH;  Surgeon: Merrie Roof, MD;  Location: [facility];  Service: General;  Laterality: Right;  . RIGHT/LEFT HEART CATH AND CORONARY/GRAFT ANGIOGRAPHY N/A 08/22/2017   Procedure: RIGHT/LEFT HEART CATH AND CORONARY/GRAFT ANGIOGRAPHY;  Surgeon: Nelva Bush, MD;  Location: [facility];  Service: Cardiovascular;  Laterality: N/A;  . TONSILLECTOMY    . US ECHOCARDIOGRAPHY     at [place] Left 11/26/2013   Procedure: VIDEO BRONCHOSCOPY WITH ENDOBRONCHIAL NAVIGATION;  Surgeon: Collene Gobble, MD;  Location: [facility];  Service: Thoracic;  Laterality: Left;    Current Meds  Medication Sig  . albuterol (PROVENTIL HFA;VENTOLIN  HFA) 108 (90 Base) MCG/ACT inhaler Inhale 2 puffs into the lungs every 6 (six) hours as needed for wheezing or shortness of breath.  Marland Kitchen albuterol (PROVENTIL) (2.5 MG/3ML) 0.083% nebulizer solution Inhale 3 mLs into the lungs every 6 (six) hours as needed for wheezing or shortness of breath.  Marland Kitchen aspirin EC 81 MG tablet Take 1 tablet (81 mg total) by mouth daily.  Marland Kitchen atorvastatin (LIPITOR) 80 MG tablet Take 80 mg by mouth daily.  Marland Kitchen buPROPion (WELLBUTRIN XL) 300 MG 24 hr tablet Take 300 mg by mouth daily.   . cephALEXin (KEFLEX) 500 MG capsule Take 500 mg by mouth daily. abx for bladder issues   . Cholecalciferol (VITAMIN D3) 1000 units CAPS Take 1 capsule by mouth daily.  . fluticasone (FLONASE) 50 MCG/ACT nasal spray Place 2 sprays into both nostrils daily.   . furosemide (LASIX) 40 MG tablet Take 0.5 tablets (20 mg total) by mouth daily.  . isosorbide dinitrate (ISORDIL) 30 MG tablet Take 60 mg by mouth as directed.  . loratadine (CLARITIN) 10 MG tablet Take 10 mg by mouth daily.  . metFORMIN (GLUCOPHAGE) 500 MG tablet Take 1 tablet (500 mg total) by mouth 2 (two) times daily. (Patient taking differently: Take  500 mg by mouth daily. )  . metoprolol tartrate (LOPRESSOR) 25 MG tablet Take 1 tablet (25 mg total) by mouth 2 (two) times daily.  . Multiple Vitamin (MULTI VITAMIN DAILY PO) Take 1 tablet by mouth daily.  . nitroGLYCERIN (NITROSTAT) 0.4 MG SL tablet Place 1 tablet (0.4 mg total) under the tongue every 5 (five) minutes as needed.  . ondansetron (ZOFRAN ODT) 4 MG disintegrating tablet 65m ODT q4 hours prn nausea/vomit  . oxyCODONE-acetaminophen (PERCOCET/ROXICET) 5-325 MG tablet Take 1 tablet by mouth every 6 (six) hours as needed for severe pain.  .Vladimir FasterGlycol-Propyl Glycol (SYSTANE OP) Place 1 drop into both eyes 4 (four) times daily as needed (for dry eyes).    Allergies: Vicodin [hydrocodone-acetaminophen]; Escitalopram; Sulfa antibiotics; and Sulfamethoxazole  Social History    Tobacco Use  . Smoking status: Former Smoker    Packs/day: 2.00    Years: 40.00    Pack years: 80.00    Types: Cigarettes    Last attempt to quit: 08/25/2006    Years since quitting: 11.8  . Smokeless tobacco: Never Used  Substance Use Topics  . Alcohol use: Yes    Comment: 1-2 times a year  . Drug use: No    Family History  Problem Relation Age of Onset  . Heart disease Mother   . Heart disease Father   . Breast cancer Maternal Aunt   . Ovarian cancer Paternal Aunt   . Breast cancer Maternal Aunt   . Anesthesia problems Neg Hx   . Hypotension Neg Hx   . Malignant hyperthermia Neg Hx   . Pseudochol deficiency Neg Hx     Review of Systems: A 12-system review of systems was performed and was negative except as noted in the HPI.  --------------------------------------------------------------------------------------------------  Physical Exam: BP (!) 112/58   Pulse 85   Ht 5' 5.5" (1.664 m)   Wt 247 lb (112 kg)   BMI 40.48 kg/m   General:  NAD. HEENT: No conjunctival pallor or scleral icterus. Moist mucous membranes.  OP clear. Neck: Supple without lymphadenopathy, thyromegaly, JVD, or HJR. No carotid bruit. Lungs: Normal work of breathing. Mildly diminished breath sounds throughout without wheezes or crackles. Heart: Distant heart sounds.  Regular rate and rhythm without murmurs, rubs, or gallops. Unable to assess PMI due to body habitus. Abd: Bowel sounds present. Soft, NT/ND.  Unable to assess HSM due to body habitus. Ext: No lower extremity edema. Radial, PT, and DP pulses are 2+ bilaterally. Skin: Warm and dry without rash.  EKG:  NSR with occasional PVC's inferior-posterior MI, and non-specific ST/T changes.  Other than PVC's, no significant change from prior tracing.  Lab Results  Component Value Date   WBC 5.9 05/03/2018   HGB 12.5 05/03/2018   HCT 40.3 05/03/2018   MCV 91.8 05/03/2018   PLT 165 05/03/2018    Lab Results  Component Value Date   NA  140 05/03/2018   K 3.6 05/03/2018   CL 108 05/03/2018   CO2 24 05/03/2018   BUN 19 05/03/2018   CREATININE 1.11 (H) 05/03/2018   GLUCOSE 116 (H) 05/03/2018   ALT 16 05/03/2018    Lab Results  Component Value Date   CHOL 126 03/10/2012   HDL 41.10 03/10/2012   LDLCALC 49 03/10/2012   TRIG 182.0 (H) 03/10/2012   CHOLHDL 3 03/10/2012   Outside labs: BMP (05/21/2018): Sodium 141, potassium 4.8, chloride 102, CO2 30, BUN 18, creatinine 1.26, glucose 100 calcium 9.8  Lipid panel (  02/10/2018): Total cholesterol 136, triglycerides 118, HDL 48, direct LDL 79 --------------------------------------------------------------------------------------------------  ASSESSMENT AND PLAN: Coronary artery disease with stable angina Shortness of breath has been anginal equivalent.  It is a little bit more pronounced over the last couple weeks in the setting of bronchitis, though overall, Ms. ramella has experienced significant improvement with escalation of isosorbide mononitrate.  She is now taking 75 mg daily, which I recommend continuing.  Given cath results earlier this year with moderate to severe ostial LMCA stenosis that was not hemodynamically significant by FFR and patent diagonal vein graft that partially supplies the LAD, I do not think that further intervention is indicated at this time.  We will continue with aggressive secondary prevention.  I will defer management of her bronchitis to her PCP and pulmonologist.  Hyperlipidemia LDL most recently found to be slightly above goal at 79.  It was previously well controlled.  We will continue atorvastatin 80 mg daily.  The patient should work on lifestyle modifications including weight loss and diet.  Cholelithiasis Patient has symptomatic cholelithiasis that will require surgical intervention in the near future.  I think she is optimized from a cardiac standpoint at this time to undergo laparoscopic cholecystectomy paren intermediate risk procedure).   Further testing and/or intervention is unlikely to further mitigate her perioperative cardiovascular risk.  I recommend continuation of aspirin 81 mg daily in the perioperative period, if possible.  Hypertension Blood pressure number is well controlled.  Continue current doses of isosorbide mononitrate, lisinopril, and metoprolol.  Follow-up: Return to see me in [place] in 4 months.  Nelva Bush, MD 06/22/2018 1:41 PM

## 2018-06-22 NOTE — Patient Instructions (Signed)
Medication Instructions:  none If you need a refill on your cardiac medications before your next appointment, please call your pharmacy.   Lab work: none If you have labs (blood work) drawn today and your tests are completely normal, you will receive your results only by: Marland Kitchen MyChart Message (if you have MyChart) OR . A paper copy in the mail If you have any lab test that is abnormal or we need to change your treatment, we will call you to review the results.  Testing/Procedures: none  Follow-Up: At [facility], you and your health needs are our priority.  As part of our continuing mission to provide you with exceptional heart care, we have created designated Provider Care Teams.  These Care Teams include your primary Cardiologist (physician) and Advanced Practice Providers (APPs -  Physician Assistants and Nurse Practitioners) who all work together to provide you with the care you need, when you need it. You will need a follow up appointment in 4 months.  Please call our office 2 months in advance to schedule this appointment.  You may see Dr End in [place] or one of the following Advanced Practice Providers on your designated Care Team:   Murray Hodgkins, NP Christell Niralya, PA-C . Marrianne Mood, PA-C  Any Other Special Instructions Will Be Listed Below (If Applicable).

## 2018-06-23 ENCOUNTER — Ambulatory Visit: Payer: Medicare Other | Admitting: Physician Assistant

## 2018-06-24 ENCOUNTER — Telehealth: Payer: Self-pay | Admitting: Cardiology

## 2018-06-24 NOTE — Telephone Encounter (Signed)
   Primary Cardiologist: Nelva Bush, MD  Chart reviewed as part of pre-operative protocol coverage. Pt was seen by Dr End 06/22/18.  Based on ACC/AHA guidelines, NOELIA LENART would be at acceptable risk for the planned procedure without further cardiovascular testing.   OK to hold ASA 3 days pre op if neccessary.   I will route this recommendation to the requesting party via Epic fax function and remove from pre-op pool.  Please call with questions.  Kerin Ransom, PA-C 06/24/2018, 4:45 PM

## 2018-08-10 NOTE — Pre-Procedure Instructions (Addendum)
Jamie [name]  08/10/2018      [place] (SE), [place] [address] [place] [address] Phone: [phone] Fax: [place], [place] [address] [place] [address] Phone: [phone] Fax: [phone]    Your procedure is scheduled on Monday, January 13th.  Report to [facility] Admitting at 6:45 A.M.  Call this number if you have problems the morning of surgery:  [phone]   Remember:  Do not eat or drink after midnight.  You may drink clear liquids until 5:45 A.M. (3 hours before your procedure time) .  Clear liquids allowed are: Water, Juice (non-citric and without pulp), Carbonated beverages, Clear Tea, Black Coffee only and Gatorade.     Take these medicines the morning of surgery with A SIP OF WATER: amLODipine (NORVASC) buPROPion (WELLBUTRIN XL)  fluticasone (FLONASE) isosorbide mononitrate (IMDUR) loratadine (CLARITIN)  metoprolol tartrate (LOPRESSOR) Polyethyl Glycol-Propyl Glycol (SYSTANE)-as needed Glycopyrrolate-Formoterol (BEVESPI AEROSPHERE)  albuterol (PROVENTIL) (2.5 MG/3ML) 0.083% nebulizer solution  budesonide-formoterol (SYMBICORT)  Follow your surgeon's instructions on when to stop Asprin.  If no instructions were given by your surgeon then you will need to call the office to get those instructions.    As of today, STOP taking any Aspirin (unless otherwise instructed by your surgeon), Aleve, Naproxen, Ibuprofen, Motrin, Advil, Goody's, BC's, all herbal medications, fish oil, and all vitamins.   WHAT DO I DO ABOUT MY DIABETES MEDICATION?   Do not take oral diabetes medicines (pills) the morning of surgery. DO NOT TAKE YOUR metFORMIN (GLUCOHPHAGE).   How to Manage Your Diabetes Before and After Surgery  Why is it important to control my blood sugar before and after  surgery? . Improving blood sugar levels before and after surgery helps healing and can limit problems. . A way of improving blood sugar control is eating a healthy diet by: o  Eating less sugar and carbohydrates o  Increasing activity/exercise o  Talking with your doctor about reaching your blood sugar goals . High blood sugars (greater than 180 mg/dL) can raise your risk of infections and slow your recovery, so you will need to focus on controlling your diabetes during the weeks before surgery. . Make sure that the doctor who takes care of your diabetes knows about your planned surgery including the date and location.  How do I manage my blood sugar before surgery? . Check your blood sugar at least 4 times a day, starting 2 days before surgery, to make sure that the level is not too high or low. o Check your blood sugar the morning of your surgery when you wake up and every 2 hours until you get to the Short Stay unit. . If your blood sugar is less than 70 mg/dL, you will need to treat for low blood sugar: o Do not take insulin. o Treat a low blood sugar (less than 70 mg/dL) with  cup of clear juice (cranberry or apple), 4 glucose tablets, OR glucose gel. o Recheck blood sugar in 15 minutes after treatment (to make sure it is greater than 70 mg/dL). If your blood sugar is not greater than 70 mg/dL on recheck, call [phone] for further instructions. . Report your blood sugar to the short stay nurse when you get to Short Stay.  . If you are admitted to  the hospital after surgery: o Your blood sugar will be checked by the staff and you will probably be given insulin after surgery (instead of oral diabetes medicines) to make sure you have good blood sugar levels. o The goal for blood sugar control after surgery is 80-180 mg/dL.    Do not wear jewelry, make-up or nail polish.  Do not wear lotions, powders, or perfumes, or deodorant.  Do not shave 48 hours prior to surgery.    Do not bring  valuables to the hospital.  [facility] is not responsible for any belongings or valuables.  Contacts, dentures or bridgework may not be worn into surgery.  Leave your suitcase in the car.  After surgery it may be brought to your room.  For patients admitted to the hospital, discharge time will be determined by your treatment team.  Patients discharged the day of surgery will not be allowed to drive home.   Special instructions:   [facility]- Preparing For Surgery  Before surgery, you can play an important role. Because skin is not sterile, your skin needs to be as free of germs as possible. You can reduce the number of germs on your skin by washing with CHG (chlorahexidine gluconate) Soap before surgery.  CHG is an antiseptic cleaner which kills germs and bonds with the skin to continue killing germs even after washing.    Oral Hygiene is also important to reduce your risk of infection.  Remember - BRUSH YOUR TEETH THE MORNING OF SURGERY WITH YOUR REGULAR TOOTHPASTE  Please do not use if you have an allergy to CHG or antibacterial soaps. If your skin becomes reddened/irritated stop using the CHG.  Do not shave (including legs and underarms) for at least 48 hours prior to first CHG shower. It is OK to shave your face.  Please follow these instructions carefully.   1. Shower the NIGHT BEFORE SURGERY and the MORNING OF SURGERY with CHG.   2. If you chose to wash your hair, wash your hair first as usual with your normal shampoo.  3. After you shampoo, rinse your hair and body thoroughly to remove the shampoo.  4. Use CHG as you would any other liquid soap. You can apply CHG directly to the skin and wash gently with a scrungie or a clean washcloth.   5. Apply the CHG Soap to your body ONLY FROM THE NECK DOWN.  Do not use on open wounds or open sores. Avoid contact with your eyes, ears, mouth and genitals (private parts). Wash Face and genitals (private parts)  with your normal  soap.  6. Wash thoroughly, paying special attention to the area where your surgery will be performed.  7. Thoroughly rinse your body with warm water from the neck down.  8. DO NOT shower/wash with your normal soap after using and rinsing off the CHG Soap.  9. Pat yourself dry with a CLEAN TOWEL.  10. Wear CLEAN PAJAMAS to bed the night before surgery, wear comfortable clothes the morning of surgery  11. Place CLEAN SHEETS on your bed the night of your first shower and DO NOT SLEEP WITH PETS.    Day of Surgery:  Do not apply any deodorants/lotions.  Please wear clean clothes to the hospital/surgery center.   Remember to brush your teeth WITH YOUR REGULAR TOOTHPASTE.  Please read over the following fact sheets that you were given.

## 2018-08-11 ENCOUNTER — Encounter (HOSPITAL_COMMUNITY): Payer: Self-pay

## 2018-08-11 ENCOUNTER — Encounter (HOSPITAL_COMMUNITY)
Admission: RE | Admit: 2018-08-11 | Discharge: 2018-08-11 | Disposition: A | Discharge status: Home / Self Care | Payer: Medicare Other | Source: Ambulatory Visit | Attending: Surgery | Admitting: Surgery

## 2018-08-11 ENCOUNTER — Other Ambulatory Visit: Payer: Self-pay

## 2018-08-11 DIAGNOSIS — Z6841 Body Mass Index (BMI) 40.0 and over, adult: Secondary | ICD-10-CM | POA: Diagnosis not present

## 2018-08-11 DIAGNOSIS — I2510 Atherosclerotic heart disease of native coronary artery without angina pectoris: Secondary | ICD-10-CM | POA: Insufficient documentation

## 2018-08-11 DIAGNOSIS — E119 Type 2 diabetes mellitus without complications: Secondary | ICD-10-CM | POA: Diagnosis not present

## 2018-08-11 DIAGNOSIS — Z853 Personal history of malignant neoplasm of breast: Secondary | ICD-10-CM | POA: Insufficient documentation

## 2018-08-11 DIAGNOSIS — I252 Old myocardial infarction: Secondary | ICD-10-CM | POA: Insufficient documentation

## 2018-08-11 DIAGNOSIS — Z7982 Long term (current) use of aspirin: Secondary | ICD-10-CM | POA: Diagnosis not present

## 2018-08-11 DIAGNOSIS — Z87440 Personal history of urinary (tract) infections: Secondary | ICD-10-CM | POA: Insufficient documentation

## 2018-08-11 DIAGNOSIS — Z01818 Encounter for other preprocedural examination: Secondary | ICD-10-CM | POA: Diagnosis present

## 2018-08-11 DIAGNOSIS — K8020 Calculus of gallbladder without cholecystitis without obstruction: Secondary | ICD-10-CM | POA: Insufficient documentation

## 2018-08-11 DIAGNOSIS — Z7951 Long term (current) use of inhaled steroids: Secondary | ICD-10-CM | POA: Diagnosis not present

## 2018-08-11 DIAGNOSIS — E669 Obesity, unspecified: Secondary | ICD-10-CM | POA: Insufficient documentation

## 2018-08-11 DIAGNOSIS — Z79899 Other long term (current) drug therapy: Secondary | ICD-10-CM | POA: Insufficient documentation

## 2018-08-11 DIAGNOSIS — Z87891 Personal history of nicotine dependence: Secondary | ICD-10-CM | POA: Diagnosis not present

## 2018-08-11 DIAGNOSIS — J449 Chronic obstructive pulmonary disease, unspecified: Secondary | ICD-10-CM | POA: Insufficient documentation

## 2018-08-11 DIAGNOSIS — I5030 Unspecified diastolic (congestive) heart failure: Secondary | ICD-10-CM | POA: Insufficient documentation

## 2018-08-11 DIAGNOSIS — I110 Hypertensive heart disease with heart failure: Secondary | ICD-10-CM | POA: Insufficient documentation

## 2018-08-11 DIAGNOSIS — G4733 Obstructive sleep apnea (adult) (pediatric): Secondary | ICD-10-CM | POA: Insufficient documentation

## 2018-08-11 DIAGNOSIS — Z951 Presence of aortocoronary bypass graft: Secondary | ICD-10-CM | POA: Diagnosis not present

## 2018-08-11 DIAGNOSIS — Z7984 Long term (current) use of oral hypoglycemic drugs: Secondary | ICD-10-CM | POA: Diagnosis not present

## 2018-08-11 DIAGNOSIS — Z9013 Acquired absence of bilateral breasts and nipples: Secondary | ICD-10-CM | POA: Insufficient documentation

## 2018-08-11 DIAGNOSIS — E785 Hyperlipidemia, unspecified: Secondary | ICD-10-CM | POA: Insufficient documentation

## 2018-08-11 LAB — COMPREHENSIVE METABOLIC PANEL
ALT: 22 U/L (ref 0–44)
AST: 20 U/L (ref 15–41)
Albumin: 3.3 g/dL — ABNORMAL LOW (ref 3.5–5.0)
Alkaline Phosphatase: 62 U/L (ref 38–126)
Anion gap: 9 (ref 5–15)
BUN: 19 mg/dL (ref 8–23)
CO2: 24 mmol/L (ref 22–32)
Calcium: 9.3 mg/dL (ref 8.9–10.3)
Chloride: 106 mmol/L (ref 98–111)
Creatinine, Ser: 1.21 mg/dL — ABNORMAL HIGH (ref 0.44–1.00)
GFR calc Af Amer: 51 mL/min — ABNORMAL LOW (ref >60)
GFR calc non Af Amer: 44 mL/min — ABNORMAL LOW (ref >60)
GLUCOSE: 150 mg/dL — AB (ref 70–99)
POTASSIUM: 3.6 mmol/L (ref 3.5–5.1)
Sodium: 139 mmol/L (ref 135–145)
Total Bilirubin: 0.7 mg/dL (ref 0.3–1.2)
Total Protein: 6.9 g/dL (ref 6.5–8.1)

## 2018-08-11 LAB — GLUCOSE, CAPILLARY: Glucose-Capillary: 194 mg/dL — ABNORMAL HIGH (ref 70–99)

## 2018-08-11 LAB — SURGICAL PCR SCREEN
MRSA, PCR: POSITIVE — AB
Staphylococcus aureus: POSITIVE — AB

## 2018-08-11 LAB — CBC
HCT: 44.2 % (ref 36.0–46.0)
Hemoglobin: 13.7 g/dL (ref 12.0–15.0)
MCH: 28.7 pg (ref 26.0–34.0)
MCHC: 31 g/dL (ref 30.0–36.0)
MCV: 92.5 fL (ref 80.0–100.0)
NRBC: 0 % (ref 0.0–0.2)
Platelets: 188 10*3/uL (ref 150–400)
RBC: 4.78 MIL/uL (ref 3.87–5.11)
RDW: 15.1 % (ref 11.5–15.5)
WBC: 6.7 10*3/uL (ref 4.0–10.5)

## 2018-08-11 LAB — HEMOGLOBIN A1C
Hgb A1c MFr Bld: 6.1 % — ABNORMAL HIGH (ref 4.8–5.6)
Mean Plasma Glucose: 128.37 mg/dL

## 2018-08-11 NOTE — Progress Notes (Addendum)
PCP - Dr. Nira Conn [facility]  Cardiologist - Dr. Harrell Gave End  Chest x-ray -03/30/18  EKG - 06/22/18 Stress Test -12/14/14  ECHO - 07/24/17 Cardiac Cath - 08/22/17  Sleep Study - 2014, negative study. Does not use a CPAP  Pt does not check CBG at home. Currently does not have a meter.   Aspirin Instructions: Will hold 5 days prior to procedure.   Anesthesia review: Yes, hx of CAD.   Patient denies shortness of breath, fever, cough and chest pain at PAT appointment   Patient verbalized understanding of instructions that were given to them at the PAT appointment. Patient was also instructed that they will need to review over the PAT instructions again at home before surgery.

## 2018-08-12 NOTE — Progress Notes (Signed)
Anesthesia Chart Review:  Case:  [id number] Date/Time:  08/17/18 0830   Procedure:  LAPAROSCOPIC CHOLECYSTECTOMY (N/A )   Anesthesia type:  General   Pre-op diagnosis:  SYMPTOMATIC GALLSTONES   Location:  [facility] ROOM 09 / [facility]   Surgeon:  Coralie Keens, MD      DISCUSSION: 73 yo female former smoker. Pertinent hx includes COPD, Asthma, CAD (s/p CABG 3007), diastolic HF, DMII, OSA, s/p Bilateral mastectomy  Pt follows with Dr. Saunders Revel for CAD. Per his last note 06/22/18: "Shortness of breath has been anginal equivalent.  It is a little bit more pronounced over the last couple weeks in the setting of bronchitis, though overall, Ms. tullo has experienced significant improvement with escalation of isosorbide mononitrate.  She is now taking 75 mg daily, which I recommend continuing.  Given cath results earlier this year with moderate to severe ostial LMCA stenosis that was not hemodynamically significant by FFR and patent diagonal vein graft that partially supplies the LAD, I do not think that further intervention is indicated at this time.  We will continue with aggressive secondary prevention.  I will defer management of her bronchitis to her PCP and pulmonologist."  Pt was then given cardiac clearance per Kerin Ransom, PA-C on 06/24/2018. Previous clearance on 06/15/2018 by Leanor Kail, PA-C stated the pt was able to achieve 6.05 METS of activity.  Anticipate she can proceed as planned barring acute status change.   VS: BP (!) 139/57   Pulse 72   Temp 36.6 C   Resp 20   Ht _0  (1.651 m)   Wt 112.4 kg   SpO2 95%   BMI 41.22 kg/m   PROVIDERS: Spry, Marsh Dolly., MD is PCP  End, Harrell Gave, MD is Cardiologist  LABS: Labs reviewed: Acceptable for surgery. (all labs ordered are listed, but only abnormal results are displayed)  Labs Reviewed  SURGICAL PCR SCREEN - Abnormal; Notable for the following components:      Result Value   MRSA, PCR POSITIVE (*)    Staphylococcus aureus  POSITIVE (*)    All other components within normal limits  GLUCOSE, CAPILLARY - Abnormal; Notable for the following components:   Glucose-Capillary 194 (*)    All other components within normal limits  HEMOGLOBIN A1C - Abnormal; Notable for the following components:   Hgb A1c MFr Bld 6.1 (*)    All other components within normal limits  COMPREHENSIVE METABOLIC PANEL - Abnormal; Notable for the following components:   Glucose, Bld 150 (*)    Creatinine, Ser 1.21 (*)    Albumin 3.3 (*)    GFR calc non Af Amer 44 (*)    GFR calc Af Amer 51 (*)    All other components within normal limits  CBC     IMAGES: CT Angio Chest 03/30/2018: IMPRESSION: 1. Technically adequate exam showing no acute pulmonary embolus. 2. Mild cardiomegaly; coronary artery disease and CABG. 3.  Aortic atherosclerosis.  (ICD10-I70.0) 4. Bilateral breast implants.  LEFT axillary node dissection. 5. No acute pulmonary abnormality.   EKG: 06/22/18: Sinus rhythm with sinus arrhythmia and occ PVCs. Rate 85. Nonspecific ST-T changes.  CV: Cath 08/22/2017: Conclusions: 1. Severe native coronary artery disease, including 70% ostial LMCA, 50% proximal LAD, and 100% mid RCA lesions. 2. Atretic LIMA->LAD. 3. Patent SVG->D1; jump portion to OM2 appears chronically occluded. 4. Widely patent SVG->RCA. 5. FFR of ostial LMCA and proximal LAD lesions is not significant (FFR 0.82), though this may be influenced by some  retrograde from via the SVG->D. 6. Mildly elevated left heart, right heart, and pulmonary artery pressures. 7. Normal Fick cardiac output/index. 8. Mild aortic stenosis.  Recommendations: 1. Aggressive medical therapy. I will start isosorbide mononitrate 30 mg daily, which should be escalated as tolerated. If the patient is intolerant of isosorbide mononitrate of has incomplete relief of symptoms, addition of ranolazine and pulmonary consultation should be considered. 2. If marked dyspnea persists despite  maximal antianginal therapy, adequate diuresis, weight loss, and pulmonary evaluation, PCI to the LMCA could be considered. 3. Continue secondary prevention, including high-intensity statin therapy and diabetes control.  TTE 07/24/17: Left ventricle: The cavity size was normal. There was mild   concentric hypertrophy. Systolic function was normal. The   estimated ejection fraction was in the range of 60% to 65%. Left   ventricular diastolic function parameters were normal. - Aortic valve: There was mild stenosis. Valve area (VTI): 1.44   cm^2. Valve area (Vmax): 1.24 cm^2. Valve area (Vmean): 1.43   cm^2. - Mitral valve: Calcified annulus. Moderately thickened, moderately   calcified leaflets . - Left atrium: The atrium was mildly dilated. - Atrial septum: No defect or patent foramen ovale was identified.  Past Medical History:  Diagnosis Date  . Anxiety   . Asthma   . Breast cancer ([facility]) 08/2011   lt. breast ca  . CAD (coronary artery disease)    NSTEMI 5/13:  LHC demonstrated oLM 75%, pLAD 80%, RCA occluded, filled by collaterals from the LAD.  Echo 12/10/11: EF 60%.; CABG 12/13/11: LIMA-LAD, SVG-RCA, SVG-diagonal and OM.   Marland Kitchen Carotid artery disease ([place])    a.  Pre-CABG Dopplers 5/13: Bilateral 40-59%; b.  Carotid US 6/01: RICA 09-32%, LICA 35-57%, normal subclavians bilaterally >> FU 6 months;  c. Carotid US 32/20: RICA 25-42%; LICA 70-62%, normal subclavians >> FU 1 year // d. Carotid US 11/17:  Stable 40-59% bilateral ICA stenosis; F/U 1 year.  Marland Kitchen COPD (chronic obstructive pulmonary disease) ([place])   . DM2 (diabetes mellitus, type 2) ([facility])    metformin  . HLD (hyperlipidemia)   . Hx of cardiovascular stress test    Myoview 5/16:  small area of inf-lat ischemia, EF 70%; Low Risk >> try medical Rx  . Hx of echocardiogram    Echo 5/16:  EF 60-65%, no RWMA, Gr 1 DD, Ao sclerosis, mild AS, mean 14 mmHg, MAC  . Hx of migraines   . Hypertension   . Myocardial infarction ([place])   .  Obesity   . Shortness of breath   . Sleep apnea    sleep study2-3 yrs ago lost weight afterward but gained back  . UTI (lower urinary tract infection)    freq-bladder implant -removed    Past Surgical History:  Procedure Laterality Date  . ABDOMINAL HYSTERECTOMY  1977  . APPENDECTOMY    . AXILLARY LYMPH NODE DISSECTION  10/18/2011   Procedure: AXILLARY LYMPH NODE DISSECTION;  Surgeon: Merrie Roof, MD;  Location: [facility];  Service: General;  Laterality: Left;  . BREAST RECONSTRUCTION  10/18/2011   Procedure: BREAST RECONSTRUCTION;  Surgeon: Theodoro Kos, DO;  Location: [facility];  Service: Plastics;  Laterality: Bilateral;   bilateral breast reconstruction with bilateral tissue expander and placement of flex hd.  Marland Kitchen CARDIAC CATHETERIZATION    . CESAREAN SECTION  04/1976  . CORONARY ARTERY BYPASS GRAFT  12/13/2011   Procedure: CORONARY ARTERY BYPASS GRAFTING (CABG);  Surgeon: Gaye Pollack, MD;  Location: [facility];  Service: Open  Heart Surgery;  Laterality: N/A;  Times four using endoscopically harvested left greater saphenous vein and left internal mammary artery. Right greater saphenous vein attempted; not appropriate for vein harvest.  . EYE SURGERY     bilateral cataract extraction   . INTRAVASCULAR PRESSURE WIRE/FFR STUDY N/A 08/22/2017   Procedure: INTRAVASCULAR PRESSURE WIRE/FFR STUDY;  Surgeon: Nelva Bush, MD;  Location: [facility] CV LAB;  Service: Cardiovascular;  Laterality: N/A;  . LAPAROSCOPIC GASTRIC BANDING  2010  . LEFT HEART CATHETERIZATION WITH CORONARY ANGIOGRAM N/A 12/11/2011   Procedure: LEFT HEART CATHETERIZATION WITH CORONARY ANGIOGRAM;  Surgeon: Larey Dresser, MD;  Location: [facility] CATH LAB;  Service: Cardiovascular;  Laterality: N/A;  . LESION REMOVAL  04/09/2012   Procedure: LESION REMOVAL;  Surgeon: Theodoro Kos, DO;  Location: [facility];  Service: Plastics;  Laterality: Bilateral;  . MASTECTOMY W/ SENTINEL NODE BIOPSY  10/18/2011   Procedure: MASTECTOMY  WITH SENTINEL LYMPH NODE BIOPSY;  Surgeon: Merrie Roof, MD;  Location: [facility];  Service: General;  Laterality: Bilateral;  bilateral mastectomy and left sentinel node biopsy  . NASAL SINUS SURGERY    . PORT-A-CATH REMOVAL  04/09/2012   Procedure: REMOVAL PORT-A-CATH;  Surgeon: Merrie Roof, MD;  Location: [facility];  Service: General;  Laterality: Right;  . PORTACATH PLACEMENT  12/02/2011   Procedure: INSERTION PORT-A-CATH;  Surgeon: Merrie Roof, MD;  Location: [facility];  Service: General;  Laterality: Right;  . RIGHT/LEFT HEART CATH AND CORONARY/GRAFT ANGIOGRAPHY N/A 08/22/2017   Procedure: RIGHT/LEFT HEART CATH AND CORONARY/GRAFT ANGIOGRAPHY;  Surgeon: Nelva Bush, MD;  Location: [facility] CV LAB;  Service: Cardiovascular;  Laterality: N/A;  . TONSILLECTOMY    . US ECHOCARDIOGRAPHY     at [facility] Left 11/26/2013   Procedure: VIDEO BRONCHOSCOPY WITH ENDOBRONCHIAL NAVIGATION;  Surgeon: Collene Gobble, MD;  Location: [facility];  Service: Thoracic;  Laterality: Left;    MEDICATIONS: . albuterol (PROVENTIL) (2.5 MG/3ML) 0.083% nebulizer solution  . amLODipine (NORVASC) 2.5 MG tablet  . aspirin EC 81 MG tablet  . atorvastatin (LIPITOR) 80 MG tablet  . budesonide-formoterol (SYMBICORT) 160-4.5 MCG/ACT inhaler  . buPROPion (WELLBUTRIN XL) 300 MG 24 hr tablet  . cephALEXin (KEFLEX) 500 MG capsule  . Cholecalciferol (VITAMIN D3) 1000 units CAPS  . fluticasone (FLONASE) 50 MCG/ACT nasal spray  . furosemide (LASIX) 40 MG tablet  . Glycopyrrolate-Formoterol (BEVESPI AEROSPHERE) 9-4.8 MCG/ACT AERO  . isosorbide mononitrate (IMDUR) 30 MG 24 hr tablet  . lisinopril (PRINIVIL,ZESTRIL) 10 MG tablet  . loratadine (CLARITIN) 10 MG tablet  . metFORMIN (GLUCOPHAGE) 500 MG tablet  . metoprolol tartrate (LOPRESSOR) 25 MG tablet  . Multiple Vitamin (MULTI VITAMIN DAILY PO)  . nitroGLYCERIN (NITROSTAT) 0.4 MG SL tablet  .  Polyethyl Glycol-Propyl Glycol (SYSTANE) 0.4-0.3 % SOLN   No current facility-administered medications for this encounter.     Wynonia Musty [facility] Stay [facility]/Anesthesiology Phone [phone] 08/12/2018 10:23 AM

## 2018-08-12 NOTE — Anesthesia Preprocedure Evaluation (Addendum)
Anesthesia Evaluation  Patient identified by MRN, date of birth, ID band Patient awake    Reviewed: Allergy & Precautions, NPO status , Patient's Chart, lab work & pertinent test results, reviewed documented beta blocker date and time   Airway Mallampati: III  TM Distance: >3 FB Neck ROM: Full    Dental no notable dental hx. (+) Missing, Chipped, Poor Dentition, Dental Advisory Given   Pulmonary asthma , sleep apnea , COPD, former smoker,    Pulmonary exam normal        Cardiovascular hypertension, Pt. on medications and Pt. on home beta blockers + CAD, + Past MI and + CABG  Normal cardiovascular exam  CV: Cath 08/22/2017: Conclusions: 1. Severe native coronary artery disease, including 70% ostial LMCA, 50% proximal LAD, and 100% mid RCA lesions. 2. Atretic LIMA->LAD. 3. Patent SVG->D1; jump portion to OM2 appears chronically occluded. 4. Widely patent SVG->RCA. 5. FFR of ostial LMCA and proximal LAD lesions is not significant (FFR 0.82), though this may be influenced by some retrograde from via the SVG->D. 6. Mildly elevated left heart, right heart, and pulmonary artery pressures. 7. Normal Fick cardiac output/index. 8. Mild aortic stenosis.  Recommendations: 1. Aggressive medical therapy. I will start isosorbide mononitrate 30 mg daily, which should be escalated as tolerated. If the patient is intolerant of isosorbide mononitrate of has incomplete relief of symptoms, addition of ranolazine and pulmonary consultation should be considered. 2. If marked dyspnea persists despite maximal antianginal therapy, adequate diuresis, weight loss, and pulmonary evaluation, PCI to the LMCA could be considered. 3. Continue secondary prevention, including high-intensity statin therapy and diabetes control.  TTE 07/24/17: Left ventricle: The cavity size was normal. There was mild concentric hypertrophy. Systolic function was normal.  The estimated ejection fraction was in the range of 60% to 65%. Left ventricular diastolic function parameters were normal. - Aortic valve: There was mild stenosis. Valve area (VTI): 1.44 cm^2. Valve area (Vmax): 1.24 cm^2. Valve area (Vmean): 1.43 cm^2. - Mitral valve: Calcified annulus. Moderately thickened, moderately calcified leaflets . - Left atrium: The atrium was mildly dilated. - Atrial septum: No defect or patent foramen ovale was identified.   Neuro/Psych PSYCHIATRIC DISORDERS Anxiety negative neurological ROS     GI/Hepatic negative GI ROS, Neg liver ROS,   Endo/Other  diabetesMorbid obesity  Renal/GU negative Renal ROS  negative genitourinary   Musculoskeletal negative musculoskeletal ROS (+)   Abdominal   Peds negative pediatric ROS (+)  Hematology negative hematology ROS (+)   Anesthesia Other Findings   Reproductive/Obstetrics negative OB ROS                          Anesthesia Physical Anesthesia Plan  ASA: III  Anesthesia Plan: General   Post-op Pain Management:    Induction: Intravenous  PONV Risk Score and Plan: 4 or greater and Ondansetron, Dexamethasone, Diphenhydramine and Treatment may vary due to age or medical condition  Airway Management Planned: Oral ETT  Additional Equipment:   Intra-op Plan:   Post-operative Plan: Extubation in OR  Informed Consent: I have reviewed the patients History and Physical, chart, labs and discussed the procedure including the risks, benefits and alternatives for the proposed anesthesia with the patient or authorized representative who has indicated his/her understanding and acceptance.   Dental advisory given  Plan Discussed with: CRNA and Anesthesiologist  Anesthesia Plan Comments: (See PAT note by Karoline Caldwell, PA-C )       Anesthesia Quick Evaluation

## 2018-08-16 NOTE — H&P (Signed)
  Jamie [name]  Location: [facility] Patient #: [id number] DOB: 1945-11-14 Married / Language: English / Race: White Female   History of Present Illness  The patient is a 73 year old female who presents for evaluation of gall stones. This patient is known to our group having had previous laparoscopic band placed by Dr. Hassell Done and having been operated on by Dr. Marlou Starks for breast cancer in the past. She has a significant cardiac history. Most recently she presented with back pain to the emergency department. She was found to have cholelithiasis on ultrasound with no other abnormalities. Her function tests were normal. She has now had at least 3 other attacks of right upper quadrant abdominal pain which she described as sharp moderate intensity. This is occurring after meals. She has no nausea or vomiting. Bowel movements are normal.   Allergies  Vicodin *ANALGESICS - OPIOID*  Sulfa Antibiotics  Escitalopram Oxalate *ANTIDEPRESSANTS*  Allergies Reconciled   Medication History  Albuterol Sulfate (108 (90 Base)MCG/ACT Aero Pow Br Act, Inhalation) Active. AmLODIPine Besylate (5MG  Tablet, Oral) Active. Aspirin (81MG  Tablet Chewable, Oral) Active. Atorvastatin Calcium (80MG  Tablet, Oral) Active. BuPROPion HCl ER (XL) (300MG  Tablet ER 24HR, Oral) Active. Fluticasone Propionate (50MCG/ACT Suspension, Nasal) Active. Furosemide (40MG  Tablet, Oral) Active. Isosorbide Mononitrate ER (30MG  Tablet ER 24HR, Oral) Active. Lisinopril (10MG  Tablet, Oral) Active. MetFORMIN HCl (500MG  Tablet, Oral) Active. Metoprolol Tartrate (50MG  Tablet, Oral) Active. Myrbetriq (50MG  Tablet ER 24HR, Oral) Active. Nitroglycerin (0.4MG  Tab Sublingual, Sublingual) Active. Multivitamin Adult (Oral) Active. Bevespi Aerosphere (9-4.8MCG/ACT Aerosol, Inhalation) Active. Medications Reconciled  Vitals   Weight: 247.2 lb Height: 65.5in Body Surface Area: 2.18 m Body Mass Index:  40.51 kg/m  Temp.: 98.34F  Pulse: 72 (Regular)     Physical Exam  General Mental Status-Alert. General Appearance-Consistent with stated age. Hydration-Well hydrated. Voice-Normal.  Head and Neck Head-normocephalic, atraumatic with no lesions or palpable masses.  Eye Eyeball - Bilateral-Extraocular movements intact. Sclera/Conjunctiva - Bilateral-No scleral icterus.  Chest and Lung Exam Chest and lung exam reveals -quiet, even and easy respiratory effort with no use of accessory muscles and on auscultation, normal breath sounds, no adventitious sounds and normal vocal resonance. Inspection Chest Wall - Normal. Back - normal.  Cardiovascular Cardiovascular examination reveals -on palpation PMI is normal in location and amplitude, no palpable S3 or S4. Normal cardiac borders., normal heart sounds, regular rate and rhythm with no murmurs, carotid auscultation reveals no bruits and normal pedal pulses bilaterally.  Abdomen Inspection Inspection of the abdomen reveals - No Hernias. Skin - Scar - no surgical scars. Palpation/Percussion Palpation and Percussion of the abdomen reveal - Soft, Non Tender, No Rebound tenderness, No Rigidity (guarding) and No hepatosplenomegaly. Auscultation Auscultation of the abdomen reveals - Bowel sounds normal. Note: Her abdomen is nontender today. It is morbidly obese. I can feel her reservoir from her lap band in the right upper quadrant. She has a tiny umbilical hernia   Neurologic - Did not examine.  Musculoskeletal - Did not examine.    Assessment & Plan   SYMPTOMATIC CHOLELITHIASIS (K80.20)  Impression: This is a patient with symptomatic gallstones and increasing attacks of biliary colic. Laparoscopic cholecystectomy is recommended. I discussed this with her in detail. I gave her literature regarding the surgery. I discussed the risk which includes but is not limited to bleeding, infection, injury to surrounding  structures, or pulmonary issues, etc. She will need preoperative cardiac clearance prior to surgery. Surgery will be scheduled

## 2018-08-17 ENCOUNTER — Encounter (HOSPITAL_COMMUNITY): Payer: Self-pay

## 2018-08-17 ENCOUNTER — Ambulatory Visit (HOSPITAL_COMMUNITY): Payer: Medicare Other | Admitting: Physician Assistant

## 2018-08-17 ENCOUNTER — Encounter (HOSPITAL_COMMUNITY)
Admission: RE | Disposition: A | Discharge status: Home / Self Care | Payer: Self-pay | Source: Home / Self Care | Attending: Surgery

## 2018-08-17 ENCOUNTER — Other Ambulatory Visit: Payer: Self-pay

## 2018-08-17 ENCOUNTER — Ambulatory Visit (HOSPITAL_COMMUNITY): Payer: Medicare Other | Admitting: Anesthesiology

## 2018-08-17 ENCOUNTER — Ambulatory Visit (HOSPITAL_COMMUNITY)
Admission: RE | Admit: 2018-08-17 | Discharge: 2018-08-17 | Disposition: A | Discharge status: Home / Self Care | Payer: Medicare Other | Attending: Surgery | Admitting: Surgery

## 2018-08-17 DIAGNOSIS — Z7982 Long term (current) use of aspirin: Secondary | ICD-10-CM | POA: Diagnosis not present

## 2018-08-17 DIAGNOSIS — J449 Chronic obstructive pulmonary disease, unspecified: Secondary | ICD-10-CM | POA: Diagnosis not present

## 2018-08-17 DIAGNOSIS — Z951 Presence of aortocoronary bypass graft: Secondary | ICD-10-CM | POA: Insufficient documentation

## 2018-08-17 DIAGNOSIS — I10 Essential (primary) hypertension: Secondary | ICD-10-CM | POA: Insufficient documentation

## 2018-08-17 DIAGNOSIS — Z87891 Personal history of nicotine dependence: Secondary | ICD-10-CM | POA: Diagnosis not present

## 2018-08-17 DIAGNOSIS — F419 Anxiety disorder, unspecified: Secondary | ICD-10-CM | POA: Diagnosis not present

## 2018-08-17 DIAGNOSIS — Z7951 Long term (current) use of inhaled steroids: Secondary | ICD-10-CM | POA: Insufficient documentation

## 2018-08-17 DIAGNOSIS — G4730 Sleep apnea, unspecified: Secondary | ICD-10-CM | POA: Insufficient documentation

## 2018-08-17 DIAGNOSIS — J45909 Unspecified asthma, uncomplicated: Secondary | ICD-10-CM | POA: Diagnosis not present

## 2018-08-17 DIAGNOSIS — K8010 Calculus of gallbladder with chronic cholecystitis without obstruction: Secondary | ICD-10-CM | POA: Diagnosis not present

## 2018-08-17 DIAGNOSIS — Z79899 Other long term (current) drug therapy: Secondary | ICD-10-CM | POA: Insufficient documentation

## 2018-08-17 DIAGNOSIS — I252 Old myocardial infarction: Secondary | ICD-10-CM | POA: Insufficient documentation

## 2018-08-17 DIAGNOSIS — Z7984 Long term (current) use of oral hypoglycemic drugs: Secondary | ICD-10-CM | POA: Insufficient documentation

## 2018-08-17 DIAGNOSIS — K8020 Calculus of gallbladder without cholecystitis without obstruction: Secondary | ICD-10-CM | POA: Diagnosis present

## 2018-08-17 DIAGNOSIS — I2510 Atherosclerotic heart disease of native coronary artery without angina pectoris: Secondary | ICD-10-CM | POA: Diagnosis not present

## 2018-08-17 DIAGNOSIS — Z6841 Body Mass Index (BMI) 40.0 and over, adult: Secondary | ICD-10-CM | POA: Insufficient documentation

## 2018-08-17 HISTORY — PX: CHOLECYSTECTOMY: SHX55

## 2018-08-17 HISTORY — DX: Calculus of gallbladder without cholecystitis without obstruction: K80.20

## 2018-08-17 LAB — GLUCOSE, CAPILLARY
GLUCOSE-CAPILLARY: 98 mg/dL (ref 70–99)
Glucose-Capillary: 115 mg/dL — ABNORMAL HIGH (ref 70–99)

## 2018-08-17 SURGERY — LAPAROSCOPIC CHOLECYSTECTOMY
Anesthesia: General | Site: Abdomen

## 2018-08-17 MED ORDER — SCOPOLAMINE 1 MG/3DAYS TD PT72
MEDICATED_PATCH | TRANSDERMAL | Status: AC
  Administered 2018-08-17: 1.5 mg
  Filled 2018-08-17: qty 1

## 2018-08-17 MED ORDER — CEFAZOLIN SODIUM-DEXTROSE 2-4 GM/100ML-% IV SOLN
INTRAVENOUS | Status: AC
  Filled 2018-08-17: qty 100

## 2018-08-17 MED ORDER — PROMETHAZINE HCL 25 MG/ML IJ SOLN: 6.2500 mg | INTRAMUSCULAR | Status: DC | PRN

## 2018-08-17 MED ORDER — ROCURONIUM BROMIDE 50 MG/5ML IV SOSY
PREFILLED_SYRINGE | INTRAVENOUS | Status: DC | PRN
  Administered 2018-08-17: 40 mg via INTRAVENOUS

## 2018-08-17 MED ORDER — PROPOFOL 10 MG/ML IV BOLUS
INTRAVENOUS | Status: DC | PRN
  Administered 2018-08-17: 130 mg via INTRAVENOUS

## 2018-08-17 MED ORDER — BUPIVACAINE-EPINEPHRINE 0.25% -1:200000 IJ SOLN
INTRAMUSCULAR | Status: DC | PRN
  Administered 2018-08-17: 20 mL

## 2018-08-17 MED ORDER — SUCCINYLCHOLINE CHLORIDE 200 MG/10ML IV SOSY
PREFILLED_SYRINGE | INTRAVENOUS | Status: AC
  Filled 2018-08-17: qty 10

## 2018-08-17 MED ORDER — SODIUM CHLORIDE 0.9 % IR SOLN
Status: DC | PRN
  Administered 2018-08-17: 1000 mL

## 2018-08-17 MED ORDER — DEXAMETHASONE SODIUM PHOSPHATE 10 MG/ML IJ SOLN
INTRAMUSCULAR | Status: AC
  Filled 2018-08-17: qty 1

## 2018-08-17 MED ORDER — CHLORHEXIDINE GLUCONATE CLOTH 2 % EX PADS: 6.0000 | MEDICATED_PAD | Freq: Once | CUTANEOUS | Status: DC

## 2018-08-17 MED ORDER — PHENYLEPHRINE 40 MCG/ML (10ML) SYRINGE FOR IV PUSH (FOR BLOOD PRESSURE SUPPORT)
PREFILLED_SYRINGE | INTRAVENOUS | Status: DC | PRN
  Administered 2018-08-17: 120 ug via INTRAVENOUS
  Administered 2018-08-17: 80 ug via INTRAVENOUS
  Administered 2018-08-17: 120 ug via INTRAVENOUS

## 2018-08-17 MED ORDER — BUPIVACAINE-EPINEPHRINE (PF) 0.25% -1:200000 IJ SOLN
INTRAMUSCULAR | Status: AC
  Filled 2018-08-17: qty 30

## 2018-08-17 MED ORDER — SUCCINYLCHOLINE CHLORIDE 200 MG/10ML IV SOSY
PREFILLED_SYRINGE | INTRAVENOUS | Status: DC | PRN
  Administered 2018-08-17: 120 mg via INTRAVENOUS

## 2018-08-17 MED ORDER — LACTATED RINGERS IV SOLN
INTRAVENOUS | Status: DC
  Administered 2018-08-17: 08:00:00 via INTRAVENOUS

## 2018-08-17 MED ORDER — DEXAMETHASONE SODIUM PHOSPHATE 10 MG/ML IJ SOLN
INTRAMUSCULAR | Status: DC | PRN
  Administered 2018-08-17: 10 mg via INTRAVENOUS

## 2018-08-17 MED ORDER — 0.9 % SODIUM CHLORIDE (POUR BTL) OPTIME
TOPICAL | Status: DC | PRN
  Administered 2018-08-17: 1000 mL

## 2018-08-17 MED ORDER — LIDOCAINE 2% (20 MG/ML) 5 ML SYRINGE
INTRAMUSCULAR | Status: DC | PRN
  Administered 2018-08-17: 80 mg via INTRAVENOUS

## 2018-08-17 MED ORDER — LACTATED RINGERS IV SOLN
INTRAVENOUS | Status: DC | PRN
  Administered 2018-08-17: 08:00:00 via INTRAVENOUS

## 2018-08-17 MED ORDER — FENTANYL CITRATE (PF) 250 MCG/5ML IJ SOLN
INTRAMUSCULAR | Status: DC | PRN
  Administered 2018-08-17: 100 ug via INTRAVENOUS
  Administered 2018-08-17: 50 ug via INTRAVENOUS

## 2018-08-17 MED ORDER — GABAPENTIN 300 MG PO CAPS
300.0000 mg | ORAL_CAPSULE | ORAL | Status: AC
  Administered 2018-08-17: 300 mg via ORAL
  Filled 2018-08-17: qty 1

## 2018-08-17 MED ORDER — ONDANSETRON HCL 4 MG/2ML IJ SOLN
INTRAMUSCULAR | Status: DC | PRN
  Administered 2018-08-17: 4 mg via INTRAVENOUS

## 2018-08-17 MED ORDER — FENTANYL CITRATE (PF) 100 MCG/2ML IJ SOLN
INTRAMUSCULAR | Status: AC
  Administered 2018-08-17: 50 ug via INTRAVENOUS
  Filled 2018-08-17: qty 2

## 2018-08-17 MED ORDER — SUGAMMADEX SODIUM 500 MG/5ML IV SOLN
INTRAVENOUS | Status: DC | PRN
  Administered 2018-08-17: 100 mg via INTRAVENOUS
  Administered 2018-08-17: 300 mg via INTRAVENOUS

## 2018-08-17 MED ORDER — CEFAZOLIN SODIUM-DEXTROSE 2-4 GM/100ML-% IV SOLN
2.0000 g | INTRAVENOUS | Status: AC
  Administered 2018-08-17: 2 g via INTRAVENOUS

## 2018-08-17 MED ORDER — FENTANYL CITRATE (PF) 100 MCG/2ML IJ SOLN
25.0000 ug | INTRAMUSCULAR | Status: DC | PRN
  Administered 2018-08-17 (×2): 50 ug via INTRAVENOUS

## 2018-08-17 MED ORDER — SUGAMMADEX SODIUM 500 MG/5ML IV SOLN
INTRAVENOUS | Status: AC
  Filled 2018-08-17: qty 5

## 2018-08-17 MED ORDER — PHENYLEPHRINE 40 MCG/ML (10ML) SYRINGE FOR IV PUSH (FOR BLOOD PRESSURE SUPPORT)
PREFILLED_SYRINGE | INTRAVENOUS | Status: AC
  Filled 2018-08-17: qty 10

## 2018-08-17 MED ORDER — FENTANYL CITRATE (PF) 250 MCG/5ML IJ SOLN
INTRAMUSCULAR | Status: AC
  Filled 2018-08-17: qty 5

## 2018-08-17 MED ORDER — ACETAMINOPHEN 500 MG PO TABS
1000.0000 mg | ORAL_TABLET | ORAL | Status: AC
  Administered 2018-08-17: 1000 mg via ORAL
  Filled 2018-08-17: qty 2

## 2018-08-17 MED ORDER — ROCURONIUM BROMIDE 50 MG/5ML IV SOSY
PREFILLED_SYRINGE | INTRAVENOUS | Status: AC
  Filled 2018-08-17: qty 5

## 2018-08-17 MED ORDER — ONDANSETRON HCL 4 MG/2ML IJ SOLN
INTRAMUSCULAR | Status: AC
  Filled 2018-08-17: qty 2

## 2018-08-17 SURGICAL SUPPLY — 34 items
ADH SKN CLS APL DERMABOND .7 (GAUZE/BANDAGES/DRESSINGS) ×1
APPLIER CLIP 5 13 M/L LIGAMAX5 (MISCELLANEOUS) ×3
BAG SPEC RTRVL LRG 6X4 10 (ENDOMECHANICALS) ×1
CANISTER SUCT 3000ML PPV (MISCELLANEOUS) ×3 IMPLANT
CHLORAPREP W/TINT 26ML (MISCELLANEOUS) ×3 IMPLANT
CLIP APPLIE 5 13 M/L LIGAMAX5 (MISCELLANEOUS) ×1 IMPLANT
COVER SURGICAL LIGHT HANDLE (MISCELLANEOUS) ×3 IMPLANT
COVER WAND RF STERILE (DRAPES) ×3 IMPLANT
DERMABOND ADVANCED (GAUZE/BANDAGES/DRESSINGS) ×2
DERMABOND ADVANCED .7 DNX12 (GAUZE/BANDAGES/DRESSINGS) ×1 IMPLANT
ELECT REM PT RETURN 9FT ADLT (ELECTROSURGICAL) ×3
ELECTRODE REM PT RTRN 9FT ADLT (ELECTROSURGICAL) ×1 IMPLANT
GLOVE SURG SIGNA 7.5 PF LTX (GLOVE) ×3 IMPLANT
GOWN STRL REUS W/ TWL LRG LVL3 (GOWN DISPOSABLE) ×2 IMPLANT
GOWN STRL REUS W/ TWL XL LVL3 (GOWN DISPOSABLE) ×1 IMPLANT
GOWN STRL REUS W/TWL LRG LVL3 (GOWN DISPOSABLE) ×6
GOWN STRL REUS W/TWL XL LVL3 (GOWN DISPOSABLE) ×3
KIT BASIN OR (CUSTOM PROCEDURE TRAY) ×3 IMPLANT
KIT TURNOVER KIT B (KITS) ×3 IMPLANT
NS IRRIG 1000ML POUR BTL (IV SOLUTION) ×3 IMPLANT
PAD ARMBOARD 7.5X6 YLW CONV (MISCELLANEOUS) ×3 IMPLANT
POUCH SPECIMEN RETRIEVAL 10MM (ENDOMECHANICALS) ×3 IMPLANT
SCISSORS LAP 5X35 DISP (ENDOMECHANICALS) ×3 IMPLANT
SET IRRIG TUBING LAPAROSCOPIC (IRRIGATION / IRRIGATOR) ×3 IMPLANT
SET TUBE SMOKE EVAC HIGH FLOW (TUBING) ×3 IMPLANT
SLEEVE ENDOPATH XCEL 5M (ENDOMECHANICALS) ×6 IMPLANT
SPECIMEN JAR SMALL (MISCELLANEOUS) ×3 IMPLANT
SUT MNCRL AB 4-0 PS2 18 (SUTURE) ×3 IMPLANT
TOWEL OR 17X24 6PK STRL BLUE (TOWEL DISPOSABLE) ×3 IMPLANT
TOWEL OR 17X26 10 PK STRL BLUE (TOWEL DISPOSABLE) ×3 IMPLANT
TRAY LAPAROSCOPIC MC (CUSTOM PROCEDURE TRAY) ×3 IMPLANT
TROCAR XCEL BLUNT TIP 100MML (ENDOMECHANICALS) ×3 IMPLANT
TROCAR XCEL NON-BLD 5MMX100MML (ENDOMECHANICALS) ×3 IMPLANT
WATER STERILE IRR 1000ML POUR (IV SOLUTION) ×3 IMPLANT

## 2018-08-17 NOTE — Transfer of Care (Signed)
Immediate Anesthesia Transfer of Care Note  Patient: Jamie [name]  Procedure(s) Performed: LAPAROSCOPIC CHOLECYSTECTOMY (N/A Abdomen)  Patient Location: PACU  Anesthesia Type:General  Level of Consciousness: awake and drowsy  Airway & Oxygen Therapy: Patient Spontanous Breathing and Patient connected to nasal cannula oxygen  Post-op Assessment: Report given to RN and Post -op Vital signs reviewed and stable  Post vital signs: Reviewed and stable  Last Vitals:  Vitals Value Taken Time  BP 169/69 08/17/2018 10:02 AM  Temp    Pulse 82 08/17/2018 10:02 AM  Resp 11 08/17/2018 10:02 AM  SpO2 96 % 08/17/2018 10:02 AM  Vitals shown include unvalidated device data.  Last Pain:  Vitals:   08/17/18 0739  TempSrc:   PainSc: 0-No pain         Complications: No apparent anesthesia complications

## 2018-08-17 NOTE — Interval H&P Note (Signed)
History and Physical Interval Note:no change in H and P  08/17/2018 8:17 AM  Jamie [name]  has presented today for surgery, with the diagnosis of SYMPTOMATIC GALLSTONES  The various methods of treatment have been discussed with the patient and family. After consideration of risks, benefits and other options for treatment, the patient has consented to  Procedure(s): LAPAROSCOPIC CHOLECYSTECTOMY (N/A) as a surgical intervention .  The patient's history has been reviewed, patient examined, no change in status, stable for surgery.  I have reviewed the patient's chart and labs.  Questions were answered to the patient's satisfaction.     Coralie Keens

## 2018-08-17 NOTE — Anesthesia Procedure Notes (Signed)
Procedure Name: Intubation Date/Time: 08/17/2018 9:15 AM Performed by: Harden Mo, CRNA Pre-anesthesia Checklist: Patient identified, Emergency Drugs available, Suction available and Patient being monitored Patient Re-evaluated:Patient Re-evaluated prior to induction Oxygen Delivery Method: Circle System Utilized Preoxygenation: Pre-oxygenation with 100% oxygen Induction Type: IV induction and Rapid sequence Ventilation: Mask ventilation without difficulty and Oral airway inserted - appropriate to patient size Laryngoscope Size: Sabra Heck and 2 Grade View: Grade II Tube type: Oral Tube size: 7.5 mm Number of attempts: 1 Airway Equipment and Method: Stylet and Oral airway Placement Confirmation: ETT inserted through vocal cords under direct vision,  positive ETCO2 and breath sounds checked- equal and bilateral Secured at: 22 cm Tube secured with: Tape Dental Injury: Teeth and Oropharynx as per pre-operative assessment

## 2018-08-17 NOTE — Discharge Instructions (Signed)
[facility] ______[facility] LAPAROSCOPIC SURGERY: POST OP INSTRUCTIONS Always review your discharge instruction sheet given to you by the facility where your surgery was performed. IF YOU HAVE DISABILITY OR FAMILY LEAVE FORMS, YOU MUST BRING THEM TO THE OFFICE FOR PROCESSING.   DO NOT GIVE THEM TO YOUR DOCTOR.  1. A prescription for pain medication may be given to you upon discharge.  Take your pain medication as prescribed, if needed.  If narcotic pain medicine is not needed, then you may take acetaminophen (Tylenol) or ibuprofen (Advil) as needed. 2. Take your usually prescribed medications unless otherwise directed. 3. If you need a refill on your pain medication, please contact your pharmacy.  They will contact our office to request authorization. Prescriptions will not be filled after 5pm or on week-ends. 4. You should follow a light diet the first few days after arrival home, such as soup and crackers, etc.  Be sure to include lots of fluids daily. 5. Most patients will experience some swelling and bruising in the area of the incisions.  Ice packs will help.  Swelling and bruising can take several days to resolve.  6. It is common to experience some constipation if taking pain medication after surgery.  Increasing fluid intake and taking a stool softener (such as Colace) will usually help or prevent this problem from occurring.  A mild laxative (Milk of Magnesia or Miralax) should be taken according to package instructions if there are no bowel movements after 48 hours. 7. Unless discharge instructions indicate otherwise, you may remove your bandages 24-48 hours after surgery, and you may shower at that time.  You may have steri-strips (small skin tapes) in place directly over the incision.  These strips should be left on the skin for 7-10 days.  If your surgeon used skin glue on the incision, you may shower in 24 hours.  The glue will flake off over the next 2-3 weeks.  Any sutures or  staples will be removed at the office during your follow-up visit. 8. ACTIVITIES:  You may resume regular (light) daily activities beginning the next day--such as daily self-care, walking, climbing stairs--gradually increasing activities as tolerated.  You may have sexual intercourse when it is comfortable.  Refrain from any heavy lifting or straining until approved by your doctor. a. You may drive when you are no longer taking prescription pain medication, you can comfortably wear a seatbelt, and you can safely maneuver your car and apply brakes. b. RETURN TO WORK:  __________________________________________________________ 9. You should see your doctor in the office for a follow-up appointment approximately 2-3 weeks after your surgery.  Make sure that you call for this appointment within a day or two after you arrive home to insure a convenient appointment time. 10. OTHER INSTRUCTIONS:no lifting more than 15 pounds for 2 weeks 11. Ok to shower starting tomorrow __________________________________________________________________________________________________________________________ __________________________________________________________________________________________________________________________ WHEN TO CALL YOUR DOCTOR: 1. Fever over 101.0 2. Inability to urinate 3. Continued bleeding from incision. 4. Increased pain, redness, or drainage from the incision. 5. Increasing abdominal pain  The clinic staff is available to answer your questions during regular business hours.  Please dont hesitate to call and ask to speak to one of the nurses for clinical concerns.  If you have a medical emergency, go to the nearest emergency room or call 911.  A surgeon from [facility] is always on call at the hospital. [address] [place], [place], [place]  [address] ? [id number], [place],  [place]   [address] [phone] ? [phone] ? FAX [phone] Web site:  [email or web address]

## 2018-08-17 NOTE — Op Note (Signed)
Laparoscopic Cholecystectomy Procedure Note  Indications: This patient presents with symptomatic gallbladder disease and will undergo laparoscopic cholecystectomy.  Pre-operative Diagnosis: symptomatic cholelithiasis  Post-operative Diagnosis: Same  Surgeon: Coralie Keens   Assistants: 0  Anesthesia: General endotracheal anesthesia  ASA Class: 3  Procedure Details  The patient was seen again in the Holding Room. The risks, benefits, complications, treatment options, and expected outcomes were discussed with the patient. The possibilities of reaction to medication, pulmonary aspiration, perforation of viscus, bleeding, recurrent infection, finding a normal gallbladder, the need for additional procedures, failure to diagnose a condition, the possible need to convert to an open procedure, and creating a complication requiring transfusion or operation were discussed with the patient. The likelihood of improving the patient's symptoms with return to their baseline status is good.  The patient and/or family concurred with the proposed plan, giving informed consent. The site of surgery properly noted. The patient was taken to Operating Room, identified as [name] and the procedure verified as Laparoscopic Cholecystectomy with Intraoperative Cholangiogram. A Time Out was held and the above information confirmed.  Prior to the induction of general anesthesia, antibiotic prophylaxis was administered. General endotracheal anesthesia was then administered and tolerated well. After the induction, the abdomen was prepped with Chloraprep and draped in sterile fashion. The patient was positioned in the supine position.  Local anesthetic agent was injected into the skin near the umbilicus and an incision made. We dissected down to the abdominal fascia with blunt dissection.  The fascia was incised vertically and we entered the peritoneal cavity bluntly.  A pursestring suture of 0-Vicryl was placed  around the fascial opening.  The Hasson cannula was inserted and secured with the stay suture.  Pneumoperitoneum was then created with CO2 and tolerated well without any adverse changes in the patient's vital signs. A 5-mm port was placed in the subxiphoid position.  Two 5-mm ports were placed in the right upper quadrant. All skin incisions were infiltrated with a local anesthetic agent before making the incision and placing the trocars.   We positioned the patient in reverse Trendelenburg, tilted slightly to the patient's left.  The gallbladder was identified, the fundus grasped and retracted cephalad. Adhesions were lysed bluntly and with the electrocautery where indicated, taking care not to injure any adjacent organs or viscus. The infundibulum was grasped and retracted laterally, exposing the peritoneum overlying the triangle of Calot. This was then divided and exposed in a blunt fashion. The cystic duct was clearly identified and bluntly dissected circumferentially. A critical view of the cystic duct and cystic artery was obtained.  The cystic duct was then ligated with clips and divided. The cystic artery was, dissected free, ligated with clips and divided as well.   The gallbladder was dissected from the liver bed in retrograde fashion with the electrocautery. The gallbladder was removed and placed in an Endocatch sac. The liver bed was irrigated and inspected. Hemostasis was achieved with the electrocautery. Copious irrigation was utilized and was repeatedly aspirated until clear.  The gallbladder and Endocatch sac were then removed through the umbilical port site.  The pursestring suture was used to close the umbilical fascia.    We again inspected the right upper quadrant for hemostasis.  Pneumoperitoneum was released as we removed the trocars.  4-0 Monocryl was used to close the skin.  Skin glue was then applied. The patient was then extubated and brought to the recovery room in stable condition.  Instrument, sponge, and needle counts were correct  at closure and at the conclusion of the case.   Findings: Mild chronic Cholecystitis with Cholelithiasis  Estimated Blood Loss: Minimal         Drains: 0         Specimens: Gallbladder           Complications: None; patient tolerated the procedure well.         Disposition: PACU - hemodynamically stable.         Condition: stable

## 2018-08-17 NOTE — Anesthesia Postprocedure Evaluation (Signed)
Anesthesia Post Note  Patient: Jamie [name]  Procedure(s) Performed: LAPAROSCOPIC CHOLECYSTECTOMY (N/A Abdomen)     Patient location during evaluation: PACU Anesthesia Type: General Level of consciousness: awake and alert and oriented Pain management: pain level controlled Vital Signs Assessment: post-procedure vital signs reviewed and stable Respiratory status: spontaneous breathing, nonlabored ventilation and respiratory function stable Cardiovascular status: blood pressure returned to baseline and stable Postop Assessment: no apparent nausea or vomiting Anesthetic complications: no Comments: SpO2 95% on RA. Drinking liquids.    Last Vitals:  Vitals:   08/17/18 1117 08/17/18 1140  BP: (!) 119/56 (!) 162/73  Pulse: 69 67  Resp: 14 16  Temp: (!) 36.3 C   SpO2: 92% 95%    Last Pain:  Vitals:   08/17/18 1047  TempSrc:   PainSc: 4                  Jamie [name] A.

## 2018-08-18 ENCOUNTER — Encounter (HOSPITAL_COMMUNITY): Payer: Self-pay | Admitting: Surgery

## 2018-10-15 ENCOUNTER — Ambulatory Visit: Payer: Medicare Other | Admitting: Internal Medicine

## 2018-11-03 ENCOUNTER — Other Ambulatory Visit: Payer: Self-pay | Admitting: Physician Assistant

## 2018-11-10 ENCOUNTER — Telehealth: Payer: Self-pay

## 2018-11-10 NOTE — Telephone Encounter (Signed)
Call attempted to reschedule face to face visit with Jamie Ercelle, PA on 11/20/2018 to a telephone or video call due to current clinic policies related to [place] [facility] precautions. No answer on home phone; left voicemail message on patient's cell requesting for her to call back to discuss.

## 2018-11-16 NOTE — Telephone Encounter (Signed)
Attempted to call patient. [facility] 11/16/2018

## 2018-11-18 ENCOUNTER — Telehealth: Payer: Self-pay | Admitting: Internal Medicine

## 2018-11-18 NOTE — Telephone Encounter (Signed)
Virtual Visit Pre-Appointment Phone Call  Steps For Call:  1. Confirm consent - "In the setting of the current Covid19 crisis, you are scheduled for a (phone or video) visit with your provider on (date) at (time).  Just as we do with many in-office visits, in order for you to participate in this visit, we must obtain consent.  If you'd like, I can send this to your mychart (if signed up) or email for you to review.  Otherwise, I can obtain your verbal consent now.  All virtual visits are billed to your insurance company just like a normal visit would be.  By agreeing to a virtual visit, we'd like you to understand that the technology does not allow for your provider to perform an examination, and thus may limit your provider's ability to fully assess your condition.  Finally, though the technology is pretty good, we cannot assure that it will always work on either your or our end, and in the setting of a video visit, we may have to convert it to a phone-only visit.  In either situation, we cannot ensure that we have a secure connection.  Are you willing to proceed?" STAFF: Did the patient verbally acknowledge consent to telehealth visit? Document YES/NO here: Yes   2. Confirm the BEST phone number to call the day of the visit by including in appointment notes  3. Give patient instructions for WebEx/MyChart download to smartphone as below or Doximity/Doxy.me if video visit (depending on what platform provider is using)  4. Advise patient to be prepared with their blood pressure, heart rate, weight, any heart rhythm information, their current medicines, and a piece of paper and pen handy for any instructions they may receive the day of their visit  5. Inform patient they will receive a phone call 15 minutes prior to their appointment time (may be from unknown caller ID) so they should be prepared to answer  6. Confirm that appointment type is correct in Epic appointment notes (VIDEO vs  PHONE)     TELEPHONE CALL NOTE  Jamie [name] has been deemed a candidate for a follow-up tele-health visit to limit community exposure during the Covid-19 pandemic. I spoke with the patient via phone to ensure availability of phone/video source, confirm preferred email & phone number, and discuss instructions and expectations.  I reminded Jamie [name] to be prepared with any vital sign and/or heart rhythm information that could potentially be obtained via home monitoring, at the time of her visit. I reminded Jamie [name] to expect a phone call at the time of her visit if her visit.  Jamie [name] 11/18/2018 4:11 PM   INSTRUCTIONS FOR DOWNLOADING THE [place] APP TO SMARTPHONE  - If Apple, ask patient to go to App Store and type in WebEx in the search bar. [employer], the blue/green circle. If Android, go to [employer] and type in [employer] in the search bar. The app is free but as with any other app downloads, their phone may require them to verify saved payment information or Apple/Android password.  - The patient does NOT have to create an account. - On the day of the visit, the assist will walk the patient through joining the meeting with the meeting number/password.  INSTRUCTIONS FOR DOWNLOADING THE MYCHART APP TO SMARTPHONE  - The patient must first make sure to have activated MyChart and know their login information - If Apple, go to [employer] and type in  MyChart in the search bar and download the app. If Android, ask patient to go to [employer] and type in [place] in the search bar and download the app. The app is free but as with any other app downloads, their phone may require them to verify saved payment information or Apple/Android password.  - The patient will need to then log into the app with their MyChart username and password, and select [facility] as their healthcare provider to link the account. When it is time for your visit, go to the  MyChart app, find appointments, and click Begin Video Visit. Be sure to Select Allow for your device to access the Microphone and Camera for your visit. You will then be connected, and your provider will be with you shortly.  **If they have any issues connecting, or need assistance please contact MyChart service desk [phone])**  **If using a computer, in order to ensure the best quality for their visit they will need to use either of the following Internet Browsers: [employer], or Google Chrome**  IF USING DOXIMITY or DOXY.ME - The patient will receive a link just prior to their visit, either by text or email (to be determined day of appointment depending on if it's doxy.me or Doximity).     FULL LENGTH CONSENT FOR TELE-HEALTH VISIT   I hereby voluntarily request, consent and authorize [facility] and its employed or contracted physicians, physician assistants, nurse practitioners or other licensed health care professionals (the Practitioner), to provide me with telemedicine health care services (the Services") as deemed necessary by the treating Practitioner. I acknowledge and consent to receive the Services by the Practitioner via telemedicine. I understand that the telemedicine visit will involve communicating with the Practitioner through live audiovisual communication technology and the disclosure of certain medical information by electronic transmission. I acknowledge that I have been given the opportunity to request an in-person assessment or other available alternative prior to the telemedicine visit and am voluntarily participating in the telemedicine visit.  I understand that I have the right to withhold or withdraw my consent to the use of telemedicine in the course of my care at any time, without affecting my right to future care or treatment, and that the Practitioner or I may terminate the telemedicine visit at any time. I understand that I have the right to  inspect all information obtained and/or recorded in the course of the telemedicine visit and may receive copies of available information for a reasonable fee.  I understand that some of the potential risks of receiving the Services via telemedicine include:   Delay or interruption in medical evaluation due to technological equipment failure or disruption;  Information transmitted may not be sufficient (e.g. poor resolution of images) to allow for appropriate medical decision making by the Practitioner; and/or   In rare instances, security protocols could fail, causing a breach of personal health information.  Furthermore, I acknowledge that it is my responsibility to provide information about my medical history, conditions and care that is complete and accurate to the best of my ability. I acknowledge that Practitioner's advice, recommendations, and/or decision may be based on factors not within their control, such as incomplete or inaccurate data provided by me or distortions of diagnostic images or specimens that may result from electronic transmissions. I understand that the practice of medicine is not an exact science and that Practitioner makes no warranties or guarantees regarding treatment outcomes. I acknowledge that I will receive a copy  of this consent concurrently upon execution via email to the email address I last provided but may also request a printed copy by calling the office of [facility].    I understand that my insurance will be billed for this visit.   I have read or had this consent read to me.  I understand the contents of this consent, which adequately explains the benefits and risks of the Services being provided via telemedicine.   I have been provided ample opportunity to ask questions regarding this consent and the Services and have had my questions answered to my satisfaction.  I give my informed consent for the services to be provided through the use of  telemedicine in my medical care  By participating in this telemedicine visit I agree to the above.

## 2018-11-19 NOTE — Progress Notes (Signed)
Virtual Visit via Video Note   This visit type was conducted due to national recommendations for restrictions regarding the COVID-19 Pandemic (e.g. social distancing) in an effort to limit this patient's exposure and mitigate transmission in our community.  Due to her co-morbid illnesses, this patient is at least at moderate risk for complications without adequate follow up.  This format is felt to be most appropriate for this patient at this time.  All issues noted in this document were discussed and addressed.  A limited physical exam was performed with this format.  Please refer to the patient's chart for her consent to telehealth for [facility].   Evaluation Performed:  Follow-up visit  Date:  11/20/2018   ID:  Jamie [name], DOB 1945/10/21, MRN [id number]  Patient Location: Home Provider Location: Office  PCP:  Verdell Carmine., MD  Cardiologist:  Nelva Bush, MD  Electrophysiologist:  None   Chief Complaint:  Shortness of breath  History of Present Illness:    Jamie [name] is a 73 y.o. female with history of coronary artery disease status post CABG (2536), diastolic heart failure, hyperlipidemia, carotid artery stenosis, breast cancer status post bilateral mastectomies, diabetes mellitus, COPD, sleep apnea, and obesity.  We are speaking today for follow-up of her shortness of breath and coronary artery disease.  I last saw Jamie [name] in 06/2018, at which time she reported improved shortness of breath after transitioning from isosorbide mononitrate 30 mg twice daily to 60 mg daily.  She complained of intermittent right upper quadrant pain and has subsequently undergone a laparoscopic cholecystectomy without incident.  Though it took several week to recover, her abdominal pain has resolved.  Jamie [name] notes that her shortness of breath, which had improved with changing the timing of isosorbide mononitrate, as returned gradually over the last few months.  Even walking around the  house now causes dyspnea.  She denies edema but has gained a few pounds due to inactivity during the COVID-19 pandemic.  She notes a single episode of having a "weird feeling" in her chest at night a week or two ago while in bed.  It resolved spontaneously over the course of a few minutes.  She otherwise denies chest pain, palpitations, lightheadedness, and orthopnea.  Jamie [name] notes that her allergies have been particularly bad recently and she just began using her nebulizer yesterday.  She is not sure if it has helped her breathing much.  The patient does not have symptoms concerning for COVID-19 infection (fever, chills, cough, or new shortness of breath).    Past Medical History:  Diagnosis Date  . Anxiety   . Asthma   . Breast cancer ([place]) 08/2011   lt. breast ca  . CAD (coronary artery disease)    NSTEMI 5/13:  LHC demonstrated oLM 75%, pLAD 80%, RCA occluded, filled by collaterals from the LAD.  Echo 12/10/11: EF 60%.; CABG 12/13/11: LIMA-LAD, SVG-RCA, SVG-diagonal and OM.   Marland Kitchen Carotid artery disease ([place])    a.  Pre-CABG Dopplers 5/13: Bilateral 40-59%; b.  Carotid US 6/44: RICA 03-47%, LICA 42-59%, normal subclavians bilaterally >> FU 6 months;  c. Carotid US 56/38: RICA 75-64%; LICA 33-29%, normal subclavians >> FU 1 year // d. Carotid US 11/17:  Stable 40-59% bilateral ICA stenosis; F/U 1 year.  Marland Kitchen COPD (chronic obstructive pulmonary disease) ([place])   . DM2 (diabetes mellitus, type 2) ([facility])    metformin  . Gallstones    Symptomatic  . HLD (hyperlipidemia)   .  Hx of cardiovascular stress test    Myoview 5/16:  small area of inf-lat ischemia, EF 70%; Low Risk >> try medical Rx  . Hx of echocardiogram    Echo 5/16:  EF 60-65%, no RWMA, Gr 1 DD, Ao sclerosis, mild AS, mean 14 mmHg, MAC  . Hx of migraines   . Hypertension   . Myocardial infarction ([place])   . Obesity   . Shortness of breath   . Sleep apnea    sleep study2-3 yrs ago lost weight afterward but gained back  . UTI (lower  urinary tract infection)    freq-bladder implant -removed   Past Surgical History:  Procedure Laterality Date  . ABDOMINAL HYSTERECTOMY  1977  . APPENDECTOMY    . AXILLARY LYMPH NODE DISSECTION  10/18/2011   Procedure: AXILLARY LYMPH NODE DISSECTION;  Surgeon: Merrie Roof, MD;  Location: [place];  Service: General;  Laterality: Left;  . BREAST RECONSTRUCTION  10/18/2011   Procedure: BREAST RECONSTRUCTION;  Surgeon: Theodoro Kos, DO;  Location: [place];  Service: Plastics;  Laterality: Bilateral;   bilateral breast reconstruction with bilateral tissue expander and placement of flex hd.  Marland Kitchen CARDIAC CATHETERIZATION    . CESAREAN SECTION  04/1976  . CHOLECYSTECTOMY N/A 08/17/2018   Procedure: LAPAROSCOPIC CHOLECYSTECTOMY;  Surgeon: Coralie Keens, MD;  Location: [place];  Service: General;  Laterality: N/A;  . CORONARY ARTERY BYPASS GRAFT  12/13/2011   Procedure: CORONARY ARTERY BYPASS GRAFTING (CABG);  Surgeon: Gaye Pollack, MD;  Location: [place];  Service: Open Heart Surgery;  Laterality: N/A;  Times four using endoscopically harvested left greater saphenous vein and left internal mammary artery. Right greater saphenous vein attempted; not appropriate for vein harvest.  . EYE SURGERY     bilateral cataract extraction   . INTRAVASCULAR PRESSURE WIRE/FFR STUDY N/A 08/22/2017   Procedure: INTRAVASCULAR PRESSURE WIRE/FFR STUDY;  Surgeon: Nelva Bush, MD;  Location: [place] CV LAB;  Service: Cardiovascular;  Laterality: N/A;  . LAPAROSCOPIC GASTRIC BANDING  2010  . LEFT HEART CATHETERIZATION WITH CORONARY ANGIOGRAM N/A 12/11/2011   Procedure: LEFT HEART CATHETERIZATION WITH CORONARY ANGIOGRAM;  Surgeon: Larey Dresser, MD;  Location[facility];  Service: Cardiovascular;  Laterality: N/A;  . LESION REMOVAL  04/09/2012   Procedure: LESION REMOVAL;  Surgeon: Theodoro Kos, DO;  Location: [place];  Service: Plastics;  Laterality: Bilateral;  . MASTECTOMY W/ SENTINEL NODE BIOPSY   10/18/2011   Procedure: MASTECTOMY WITH SENTINEL LYMPH NODE BIOPSY;  Surgeon: Merrie Roof, MD;  Location: [place];  Service: General;  Laterality: Bilateral;  bilateral mastectomy and left sentinel node biopsy  . NASAL SINUS SURGERY    . PORT-A-CATH REMOVAL  04/09/2012   Procedure: REMOVAL PORT-A-CATH;  Surgeon: Merrie Roof, MD;  Location: [place];  Service: General;  Laterality: Right;  . PORTACATH PLACEMENT  12/02/2011   Procedure: INSERTION PORT-A-CATH;  Surgeon: Merrie Roof, MD;  Location: [place];  Service: General;  Laterality: Right;  . RIGHT/LEFT HEART CATH AND CORONARY/GRAFT ANGIOGRAPHY N/A 08/22/2017   Procedure: RIGHT/LEFT HEART CATH AND CORONARY/GRAFT ANGIOGRAPHY;  Surgeon: Nelva Bush, MD;  Location: [place] CV LAB;  Service: Cardiovascular;  Laterality: N/A;  . TONSILLECTOMY    . US ECHOCARDIOGRAPHY     at [place] NAVIGATION Left 11/26/2013   Procedure: VIDEO BRONCHOSCOPY WITH ENDOBRONCHIAL NAVIGATION;  Surgeon: Collene Gobble, MD;  Location: [place];  Service:  Thoracic;  Laterality: Left;     Current Meds  Medication Sig  . albuterol (PROVENTIL) (2.5 MG/3ML) 0.083% nebulizer solution Inhale 3 mLs into the lungs every 6 (six) hours as needed for wheezing or shortness of breath.  Marland Kitchen amLODipine (NORVASC) 2.5 MG tablet Take 2.5 mg by mouth daily.  Marland Kitchen aspirin EC 81 MG tablet Take 1 tablet (81 mg total) by mouth daily.  Marland Kitchen atorvastatin (LIPITOR) 80 MG tablet TAKE 1 TABLET BY MOUTH ONCE DAILY AT  6PM  . buPROPion (WELLBUTRIN XL) 300 MG 24 hr tablet Take 300 mg by mouth daily.   . cephALEXin (KEFLEX) 500 MG capsule Take 500 mg by mouth daily.   . Cholecalciferol (VITAMIN D3) 1000 units CAPS Take 1,000 Units by mouth daily.   . fluticasone (FLONASE) 50 MCG/ACT nasal spray Place 2 sprays into both nostrils daily.   . furosemide (LASIX) 40 MG tablet Take 0.5 tablets (20 mg total) by mouth daily. (Patient taking  differently: Take 20 mg by mouth 2 (two) times daily. )  . Glycopyrrolate-Formoterol (BEVESPI AEROSPHERE) 9-4.8 MCG/ACT AERO Inhale 2 puffs into the lungs daily.  . isosorbide mononitrate (IMDUR) 30 MG 24 hr tablet Take 60 mg by mouth daily.  Marland Kitchen lisinopril (PRINIVIL,ZESTRIL) 10 MG tablet Take 0.5 tablets (5 mg total) by mouth daily.  Marland Kitchen loratadine (CLARITIN) 10 MG tablet Take 10 mg by mouth daily.  . metFORMIN (GLUCOPHAGE) 500 MG tablet Take 1 tablet (500 mg total) by mouth 2 (two) times daily. (Patient taking differently: Take 500 mg by mouth every evening. )  . metoprolol tartrate (LOPRESSOR) 25 MG tablet Take 1 tablet (25 mg total) by mouth 2 (two) times daily.  . Multiple Vitamin (MULTI VITAMIN DAILY PO) Take 1 tablet by mouth daily.  . nitroGLYCERIN (NITROSTAT) 0.4 MG SL tablet Place 1 tablet (0.4 mg total) under the tongue every 5 (five) minutes as needed.  Vladimir Faster Glycol-Propyl Glycol (SYSTANE) 0.4-0.3 % SOLN Place 1 drop into both eyes 4 (four) times daily as needed (for dry eyes).      Allergies:   Penicillins; Vicodin [hydrocodone-acetaminophen]; Sulfa antibiotics; and Sulfamethoxazole   Social History   Tobacco Use  . Smoking status: Former Smoker    Packs/day: 2.00    Years: 40.00    Pack years: 80.00    Types: Cigarettes    Last attempt to quit: 08/25/2006    Years since quitting: 12.2  . Smokeless tobacco: Never Used  Substance Use Topics  . Alcohol use: Yes    Comment: 1-2 times a year  . Drug use: No     Family Hx: The patient's family history includes Breast cancer in her maternal aunt and maternal aunt; Heart disease in her father and mother; Ovarian cancer in her paternal aunt. There is no history of Anesthesia problems, Hypotension, Malignant hyperthermia, or Pseudochol deficiency.  ROS:   Please see the history of present illness.   All other systems reviewed and are negative.   Prior CV studies:   The following studies were reviewed today:  L/RHC  (08/22/2017): 1. Severe native coronary artery disease, including 70% ostial LMCA, 50% proximal LAD, and 100% mid RCA lesions. 2. Atretic LIMA->LAD. 3. Patent SVG->D1; jump portion to OM2 appears chronically occluded. 4. Widely patent SVG->RCA. 5. FFR of ostial LMCA and proximal LAD lesions is not significant (FFR 0.82), though this may be influenced by some retrograde from via the SVG->D. 6. Mildly elevated left heart, right heart, and pulmonary artery pressures. 7. Normal Fick  cardiac output/index. 8. Mild aortic stenosis.  Echo (07/24/2017): - Left ventricle: The cavity size was normal. There was mild   concentric hypertrophy. Systolic function was normal. The   estimated ejection fraction was in the range of 60% to 65%. Left   ventricular diastolic function parameters were normal. - Aortic valve: There was mild stenosis. Valve area (VTI): 1.44   cm^2. Valve area (Vmax): 1.24 cm^2. Valve area (Vmean): 1.43   cm^2. - Mitral valve: Calcified annulus. Moderately thickened, moderately   calcified leaflets . - Left atrium: The atrium was mildly dilated. - Atrial septum: No defect or patent foramen ovale was identified.  Labs/Other Tests and Data Reviewed:    EKG:  No ECG reviewed.  Recent Labs: 08/11/2018: ALT 22; BUN 19; Creatinine, Ser 1.21; Hemoglobin 13.7; Platelets 188; Potassium 3.6; Sodium 139   Recent Lipid Panel Lab Results  Component Value Date/Time   CHOL 126 03/10/2012 10:15 AM   TRIG 182.0 (H) 03/10/2012 10:15 AM   HDL 41.10 03/10/2012 10:15 AM   CHOLHDL 3 03/10/2012 10:15 AM   LDLCALC 49 03/10/2012 10:15 AM    Wt Readings from Last 3 Encounters:  11/20/18 247 lb (112 kg)  08/17/18 247 lb (112 kg)  08/11/18 247 lb 11.2 oz (112.4 kg)     Objective:    Vital Signs:  Ht _0  (1.651 m)   Wt 247 lb (112 kg)   BMI 41.10 kg/m    Obese female in no acute distress.  ASSESSMENT & PLAN:    Shortness of breath: This is likely multifactorial but has been an  anginal equivalent for Jamie [name] in the past.  Underlying lung disease and obesity with weight gain in the setting of inactivity could certainly be contributing.  We have agreed to increase isosorbide mononitrate to 90 mg daily.  If symptoms to not improve, ischemia evaluation will need to be considered.  We would also need to consider repeat echo, with history of mild aortic stenosis.  However, I have a low suspicion that her AS would progress to the point of causing symptoms so quickly.  Coronary artery disease: As above, dyspnea has been the patient's anginal equivalent.  We will escalate isosorbide mononitrate and continue current doses of metoprolol and amlodipine.  Hypertension: Jamie [name] does not have a blood pressure cuff.  She does not report symptoms of low blood pressure.  We will increase Imdur to 90 mg daily; other meds to remain unchanged.  Hyperlipidemia: Lipids followed by PCP (no recent values in our system).  Continue atorvastatin 80 mg daily for goal LDL < 70.  COVID-19 Education: The signs and symptoms of COVID-19 were discussed with the patient and how to seek care for testing (follow up with PCP or arrange E-visit).  The importance of social distancing was discussed today.  Time:   Today, I have spent 15 minutes with the patient with telehealth technology discussing the above problems.  An additional 10 minutes were spent reviewing the patient's chart and documenting today's encounter.   Medication Adjustments/Labs and Tests Ordered: Current medicines are reviewed at length with the patient today.  Concerns regarding medicines are outlined above.   Tests Ordered: None.  Medication Changes: Meds ordered this encounter  Medications  . isosorbide mononitrate (IMDUR) 30 MG 24 hr tablet    Sig: Take 1 tablet (30 mg total) by mouth daily.    Dispense:  270 tablet    Refill:  1    Disposition:  Follow up in 4  week(s)  Signed, Nelva Bush, MD  11/20/2018 11:25 AM     [facility]

## 2018-11-20 ENCOUNTER — Encounter: Payer: Self-pay | Admitting: Internal Medicine

## 2018-11-20 ENCOUNTER — Other Ambulatory Visit: Payer: Self-pay

## 2018-11-20 ENCOUNTER — Telehealth (INDEPENDENT_AMBULATORY_CARE_PROVIDER_SITE_OTHER): Payer: Medicare Other | Admitting: Internal Medicine

## 2018-11-20 VITALS — Ht 65.0 in | Wt 247.0 lb

## 2018-11-20 DIAGNOSIS — I10 Essential (primary) hypertension: Secondary | ICD-10-CM

## 2018-11-20 DIAGNOSIS — E785 Hyperlipidemia, unspecified: Secondary | ICD-10-CM

## 2018-11-20 DIAGNOSIS — R0602 Shortness of breath: Secondary | ICD-10-CM

## 2018-11-20 DIAGNOSIS — I25119 Atherosclerotic heart disease of native coronary artery with unspecified angina pectoris: Secondary | ICD-10-CM

## 2018-11-20 MED ORDER — ISOSORBIDE MONONITRATE ER 30 MG PO TB24: 30.0000 mg | ORAL_TABLET | Freq: Every day | ORAL | 1 refills | Status: DC

## 2018-11-20 NOTE — Patient Instructions (Addendum)
Medication Instructions:  Your physician has recommended you make the following change in your medication:  1- INCREASE Isosorbide to 90 mg by mouth once a day.  If you need a refill on your cardiac medications before your next appointment, please call your pharmacy.   Lab work: none If you have labs (blood work) drawn today and your tests are completely normal, you will receive your results only by: Marland Kitchen MyChart Message (if you have MyChart) OR . A paper copy in the mail If you have any lab test that is abnormal or we need to change your treatment, we will call you to review the results.  Testing/Procedures: none  Follow-Up: At [facility], you and your health needs are our priority.  As part of our continuing mission to provide you with exceptional heart care, we have created designated Provider Care Teams.  These Care Teams include your primary Cardiologist (physician) and Advanced Practice Providers (APPs -  Physician Assistants and Nurse Practitioners) who all work together to provide you with the care you need, when you need it. You will need a follow up appointment in 4 weeks for a Virtual Visit  (already scheduled).   You may see Nelva Bush, MD .   Please call us if your breathing worsens.

## 2018-12-15 NOTE — Progress Notes (Signed)
Virtual Visit via Video Note   This visit type was conducted due to national recommendations for restrictions regarding the COVID-19 Pandemic (e.g. social distancing) in an effort to limit this patient's exposure and mitigate transmission in our community.  Due to her co-morbid illnesses, this patient is at least at moderate risk for complications without adequate follow up.  This format is felt to be most appropriate for this patient at this time.  All issues noted in this document were discussed and addressed.  A limited physical exam was performed with this format.  Please refer to the patient's chart for her consent to telehealth for [facility].   Date:  12/16/2018   ID:  Jamie [name], DOB 1945/09/06, MRN [id number]  Patient Location: Home Provider Location: Office  PCP:  Verdell Carmine., MD  Cardiologist:  Nelva Bush, MD  Electrophysiologist:  None   Evaluation Performed:  Follow-Up Visit  Chief Complaint: Shortness of breath  History of Present Illness:    Jamie [name] is a 73 y.o. female with history of coronary artery disease status post CABG (0177), diastolic heart failure, hyperlipidemia, carotid artery stenosis, breast cancer status post bilateral mastectomies, diabetes mellitus, COPD, sleep apnea, and obesity.  We last spoke about a month ago, at which time Jamie [name] reported worsening shortness of breath (her anginal equivalent).  We agreed to increase isosorbide mononitrate to 90 mg daily.  Today, Jamie [name] reports that her breathing is somewhat better, though that it did not improve as quickly as with her prior escalation of isosorbide mononitrate.  She is not doing much in the way of activity.  She wonders if some of her shortness of breath may be related to deconditioning or primary lung disease.  She was previously prescribed the Bevespi inhaler but has been unable to afford this.  She denies chest pain and palpitations.  She had brief swelling of the right cheek  but otherwise no edema.  This has resolved on its own.  Weight has been stable.  The patient does not have symptoms concerning for COVID-19 infection (fever, chills, cough, or new shortness of breath).    Past Medical History:  Diagnosis Date   Anxiety    Asthma    Breast cancer ([place]) 08/2011   lt. breast ca   CAD (coronary artery disease)    NSTEMI 5/13:  LHC demonstrated oLM 75%, pLAD 80%, RCA occluded, filled by collaterals from the LAD.  Echo 12/10/11: EF 60%.; CABG 12/13/11: LIMA-LAD, SVG-RCA, SVG-diagonal and OM.    Carotid artery disease ([place])    a.  Pre-CABG Dopplers 5/13: Bilateral 40-59%; b.  Carotid US 9/39: RICA 03-00%, LICA 92-33%, normal subclavians bilaterally >> FU 6 months;  c. Carotid US 00/76: RICA 22-63%; LICA 33-54%, normal subclavians >> FU 1 year // d. Carotid US 11/17:  Stable 40-59% bilateral ICA stenosis; F/U 1 year.   COPD (chronic obstructive pulmonary disease) (HCC)    DM2 (diabetes mellitus, type 2) (HCC)    metformin   Gallstones    Symptomatic   HLD (hyperlipidemia)    Hx of cardiovascular stress test    Myoview 5/16:  small area of inf-lat ischemia, EF 70%; Low Risk >> try medical Rx   Hx of echocardiogram    Echo 5/16:  EF 60-65%, no RWMA, Gr 1 DD, Ao sclerosis, mild AS, mean 14 mmHg, MAC   Hx of migraines    Hypertension    Myocardial infarction ([place])    Obesity  Shortness of breath    Sleep apnea    sleep study2-3 yrs ago lost weight afterward but gained back   UTI (lower urinary tract infection)    freq-bladder implant -removed   Past Surgical History:  Procedure Laterality Date   ABDOMINAL HYSTERECTOMY  1977   APPENDECTOMY     AXILLARY LYMPH NODE DISSECTION  10/18/2011   Procedure: AXILLARY LYMPH NODE DISSECTION;  Surgeon: Merrie Roof, MD;  Location: [place];  Service: General;  Laterality: Left;   BREAST RECONSTRUCTION  10/18/2011   Procedure: BREAST RECONSTRUCTION;  Surgeon: Theodoro Kos, DO;  Location: [place];   Service: Plastics;  Laterality: Bilateral;   bilateral breast reconstruction with bilateral tissue expander and placement of flex hd.   CARDIAC CATHETERIZATION     CESAREAN SECTION  04/1976   CHOLECYSTECTOMY N/A 08/17/2018   Procedure: LAPAROSCOPIC CHOLECYSTECTOMY;  Surgeon: Coralie Keens, MD;  Location: [place];  Service: General;  Laterality: N/A;   CORONARY ARTERY BYPASS GRAFT  12/13/2011   Procedure: CORONARY ARTERY BYPASS GRAFTING (CABG);  Surgeon: Gaye Pollack, MD;  Location: [place];  Service: Open Heart Surgery;  Laterality: N/A;  Times four using endoscopically harvested left greater saphenous vein and left internal mammary artery. Right greater saphenous vein attempted; not appropriate for vein harvest.   EYE SURGERY     bilateral cataract extraction    INTRAVASCULAR PRESSURE WIRE/FFR STUDY N/A 08/22/2017   Procedure: INTRAVASCULAR PRESSURE WIRE/FFR STUDY;  Surgeon: Nelva Bush, MD;  Location: [place] CV LAB;  Service: Cardiovascular;  Laterality: N/A;   LAPAROSCOPIC GASTRIC BANDING  2010   LEFT HEART CATHETERIZATION WITH CORONARY ANGIOGRAM N/A 12/11/2011   Procedure: LEFT HEART CATHETERIZATION WITH CORONARY ANGIOGRAM;  Surgeon: Larey Dresser, MD;  Location: [facility] CATH LAB;  Service: Cardiovascular;  Laterality: N/A;   LESION REMOVAL  04/09/2012   Procedure: LESION REMOVAL;  Surgeon: Theodoro Kos, DO;  Location: [place];  Service: Plastics;  Laterality: Bilateral;   MASTECTOMY W/ SENTINEL NODE BIOPSY  10/18/2011   Procedure: MASTECTOMY WITH SENTINEL LYMPH NODE BIOPSY;  Surgeon: Merrie Roof, MD;  Location: [place];  Service: General;  Laterality: Bilateral;  bilateral mastectomy and left sentinel node biopsy   NASAL SINUS SURGERY     PORT-A-CATH REMOVAL  04/09/2012   Procedure: REMOVAL PORT-A-CATH;  Surgeon: Merrie Roof, MD;  Location: [place];  Service: General;  Laterality: Right;   PORTACATH PLACEMENT  12/02/2011   Procedure:  INSERTION PORT-A-CATH;  Surgeon: Merrie Roof, MD;  Location: [place];  Service: General;  Laterality: Right;   RIGHT/LEFT HEART CATH AND CORONARY/GRAFT ANGIOGRAPHY N/A 08/22/2017   Procedure: RIGHT/LEFT HEART CATH AND CORONARY/GRAFT ANGIOGRAPHY;  Surgeon: Nelva Bush, MD;  Location: [place] CV LAB;  Service: Cardiovascular;  Laterality: N/A;   TONSILLECTOMY     US ECHOCARDIOGRAPHY     at [place] Left 11/26/2013   Procedure: VIDEO BRONCHOSCOPY WITH ENDOBRONCHIAL NAVIGATION;  Surgeon: Collene Gobble, MD;  Location: [facility];  Service: Thoracic;  Laterality: Left;     Current Meds  Medication Sig   albuterol (PROVENTIL) (2.5 MG/3ML) 0.083% nebulizer solution Inhale 3 mLs into the lungs every 6 (six) hours as needed for wheezing or shortness of breath.   amLODipine (NORVASC) 2.5 MG tablet Take 2.5 mg by mouth daily.   aspirin EC 81 MG tablet Take 1 tablet (81 mg total) by mouth daily.   atorvastatin (  LIPITOR) 80 MG tablet TAKE 1 TABLET BY MOUTH ONCE DAILY AT  6PM   buPROPion (WELLBUTRIN XL) 300 MG 24 hr tablet Take 300 mg by mouth daily.    cephALEXin (KEFLEX) 500 MG capsule Take 500 mg by mouth daily.    Cholecalciferol (VITAMIN D3) 1000 units CAPS Take 1,000 Units by mouth daily.    fluticasone (FLONASE) 50 MCG/ACT nasal spray Place 2 sprays into both nostrils daily.    furosemide (LASIX) 40 MG tablet Take 0.5 tablets (20 mg total) by mouth daily. (Patient taking differently: Take 20 mg by mouth 2 (two) times daily. )   Glycopyrrolate-Formoterol (BEVESPI AEROSPHERE) 9-4.8 MCG/ACT AERO Inhale 2 puffs into the lungs daily.   isosorbide mononitrate (IMDUR) 30 MG 24 hr tablet Take 1 tablet (30 mg total) by mouth daily. (Patient taking differently: Take 90 mg by mouth daily. )   lisinopril (PRINIVIL,ZESTRIL) 10 MG tablet Take 0.5 tablets (5 mg total) by mouth daily.   loratadine (CLARITIN) 10 MG tablet Take 10 mg by  mouth daily.   metFORMIN (GLUCOPHAGE) 500 MG tablet Take 1 tablet (500 mg total) by mouth 2 (two) times daily. (Patient taking differently: Take 500 mg by mouth every evening. )   metoprolol tartrate (LOPRESSOR) 25 MG tablet Take 1 tablet (25 mg total) by mouth 2 (two) times daily.   Multiple Vitamin (MULTI VITAMIN DAILY PO) Take 1 tablet by mouth daily.   nitroGLYCERIN (NITROSTAT) 0.4 MG SL tablet Place 1 tablet (0.4 mg total) under the tongue every 5 (five) minutes as needed.   Polyethyl Glycol-Propyl Glycol (SYSTANE) 0.4-0.3 % SOLN Place 1 drop into both eyes 4 (four) times daily as needed (for dry eyes).      Allergies:   Penicillins; Vicodin [hydrocodone-acetaminophen]; Sulfa antibiotics; and Sulfamethoxazole   Social History   Tobacco Use   Smoking status: Former Smoker    Packs/day: 2.00    Years: 40.00    Pack years: 80.00    Types: Cigarettes    Last attempt to quit: 08/25/2006    Years since quitting: 12.3   Smokeless tobacco: Never Used  Substance Use Topics   Alcohol use: Yes    Comment: 1-2 times a year   Drug use: No     Family Hx: The patient's family history includes Breast cancer in her maternal aunt and maternal aunt; Heart disease in her father and mother; Ovarian cancer in her paternal aunt. There is no history of Anesthesia problems, Hypotension, Malignant hyperthermia, or Pseudochol deficiency.  ROS:   Please see the history of present illness.   All other systems reviewed and are negative.   Prior CV studies:   The following studies were reviewed today:  L/RHC (08/22/2017): 1. Severe native coronary artery disease, including 70% ostial LMCA, 50% proximal LAD, and 100% mid RCA lesions. 2. Atretic LIMA->LAD. 3. Patent SVG->D1; jump portion to OM2 appears chronically occluded. 4. Widely patent SVG->RCA. 5. FFR of ostial LMCA and proximal LAD lesions is not significant (FFR 0.82), though this may be influenced by some retrograde from via the  SVG->D. 6. Mildly elevated left heart, right heart, and pulmonary artery pressures. 7. Normal Fick cardiac output/index. 8. Mild aortic stenosis.  Echo (07/24/2017): - Left ventricle: The cavity size was normal. There was mild concentric hypertrophy. Systolic function was normal. The estimated ejection fraction was in the range of 60% to 65%. Left ventricular diastolic function parameters were normal. - Aortic valve: There was mild stenosis. Valve area (VTI): 1.44 cm^2. Valve area (  Vmax): 1.24 cm^2. Valve area (Vmean): 1.43 cm^2. - Mitral valve: Calcified annulus. Moderately thickened, moderately calcified leaflets . - Left atrium: The atrium was mildly dilated. - Atrial septum: No defect or patent foramen ovale was identified.  Labs/Other Tests and Data Reviewed:    EKG:  No ECG reviewed.  Recent Labs: 08/11/2018: ALT 22; BUN 19; Creatinine, Ser 1.21; Hemoglobin 13.7; Platelets 188; Potassium 3.6; Sodium 139   Recent Lipid Panel Lab Results  Component Value Date/Time   CHOL 126 03/10/2012 10:15 AM   TRIG 182.0 (H) 03/10/2012 10:15 AM   HDL 41.10 03/10/2012 10:15 AM   CHOLHDL 3 03/10/2012 10:15 AM   LDLCALC 49 03/10/2012 10:15 AM    Wt Readings from Last 3 Encounters:  12/16/18 247 lb (112 kg)  11/20/18 247 lb (112 kg)  08/17/18 247 lb (112 kg)     Objective:    Vital Signs:  BP 120/88 (BP Location: Left Arm, Patient Position: Sitting, Cuff Size: Normal)    Ht _0  (1.651 m)    Wt 247 lb (112 kg)    BMI 41.10 kg/m    VITAL SIGNS:  reviewed GEN:  no acute distress  ASSESSMENT & PLAN:    Coronary artery disease with stable angina: Dyspnea on exertion has always been the patient's anginal equivalent, though shortness of breath is likely multifactorial.  She has noted some improvement with increased dose of isosorbide mononitrate.  We have agreed to continue her current dose and try to increase her activity to see if this helps improve her dyspnea.  We will  also continue amlodipine 2.5 mg daily and metoprolol tartrate 25 mg twice daily for antianginal therapy.  Given that underlying lung disease is likely contributing, I have asked Mr. Pavich to speak with her PCP about potential alternatives to [facility] for management of obstructive lung disease.  Hyperlipidemia: Continue atorvastatin 80 mg daily.  Hypertension: Systolic blood pressure normal but diastolic reading mildly elevated.  We will continue current medications and follow-up blood pressure and need for further titration of regimen when Jamie [name] follows up with [place] in 3 months.  COVID-19 Education: The signs and symptoms of COVID-19 were discussed with the patient and how to seek care for testing (follow up with PCP or arrange E-visit).  The importance of social distancing was discussed today.  Time:   Today, I have spent 10 minutes with the patient with telehealth technology discussing the above problems.     Medication Adjustments/Labs and Tests Ordered: Current medicines are reviewed at length with the patient today.  Concerns regarding medicines are outlined above.   Tests Ordered: None.  Medication Changes: None.  Disposition:  Follow up in 3 month(s)  Signed, Nelva Bush, MD  12/16/2018 2:31 PM    [facility]

## 2018-12-16 ENCOUNTER — Encounter: Payer: Self-pay | Admitting: Internal Medicine

## 2018-12-16 ENCOUNTER — Telehealth (INDEPENDENT_AMBULATORY_CARE_PROVIDER_SITE_OTHER): Payer: Medicare Other | Admitting: Internal Medicine

## 2018-12-16 ENCOUNTER — Other Ambulatory Visit: Payer: Self-pay

## 2018-12-16 VITALS — BP 120/88 | Ht 65.0 in | Wt 247.0 lb

## 2018-12-16 DIAGNOSIS — I10 Essential (primary) hypertension: Secondary | ICD-10-CM

## 2018-12-16 DIAGNOSIS — I25118 Atherosclerotic heart disease of native coronary artery with other forms of angina pectoris: Secondary | ICD-10-CM

## 2018-12-16 DIAGNOSIS — E785 Hyperlipidemia, unspecified: Secondary | ICD-10-CM | POA: Diagnosis not present

## 2018-12-16 NOTE — Patient Instructions (Signed)
Medication Instructions:  Your physician recommends that you continue on your current medications as directed. Please refer to the Current Medication list given to you today.  If you need a refill on your cardiac medications before your next appointment, please call your pharmacy.   Lab work: none If you have labs (blood work) drawn today and your tests are completely normal, you will receive your results only by: Marland Kitchen MyChart Message (if you have MyChart) OR . A paper copy in the mail If you have any lab test that is abnormal or we need to change your treatment, we will call you to review the results.  Testing/Procedures: none  Follow-Up: At [facility], you and your health needs are our priority.  As part of our continuing mission to provide you with exceptional heart care, we have created designated Provider Care Teams.  These Care Teams include your primary Cardiologist (physician) and Advanced Practice Providers (APPs -  Physician Assistants and Nurse Practitioners) who all work together to provide you with the care you need, when you need it. You will need a follow up appointment in 3 months.  Please call our office 2 months in advance to schedule this appointment.  You may see Nelva Bush, MD or one of the following Advanced Practice Providers on your designated Care Team:   Murray Hodgkins, NP Christell Glender, PA-C . Marrianne Mood, PA-C

## 2018-12-22 ENCOUNTER — Other Ambulatory Visit: Payer: Self-pay | Admitting: Physician Assistant

## 2018-12-24 ENCOUNTER — Other Ambulatory Visit: Payer: Self-pay | Admitting: Physician Assistant

## 2018-12-26 ENCOUNTER — Other Ambulatory Visit: Payer: Self-pay | Admitting: Physician Assistant

## 2018-12-30 ENCOUNTER — Other Ambulatory Visit: Payer: Self-pay | Admitting: Physician Assistant

## 2019-01-08 ENCOUNTER — Telehealth: Payer: Self-pay | Admitting: Internal Medicine

## 2019-01-08 MED ORDER — METOPROLOL TARTRATE 25 MG PO TABS: 25.0000 mg | ORAL_TABLET | Freq: Two times a day (BID) | ORAL | 3 refills | Status: DC

## 2019-01-08 NOTE — Telephone Encounter (Signed)
Patient states that Richardson Dopp, [place] denied her Metoprolol and would like to discuss. Please call.

## 2019-01-16 ENCOUNTER — Other Ambulatory Visit: Payer: Self-pay | Admitting: Physician Assistant

## 2019-01-29 ENCOUNTER — Telehealth: Payer: Self-pay | Admitting: Internal Medicine

## 2019-01-29 NOTE — Telephone Encounter (Signed)
There a 2 directions written for the pt Isosorbide on 12/16/18 telemedicine visit with Dr. Saunders Revel Isosorbide 30mg  daily and (Patient taking differently 90mg  daily) Pt is requesting 90mg  daily refill. Will route to Dr. Saunders Revel for approval

## 2019-01-29 NOTE — Telephone Encounter (Signed)
Pt c/o medication issue:  1. Name of Medication: Isosorbide Mononitrate 30 mg  2. How are you currently taking this medication (dosage and times per day)? Previously took 30 mg 2xday in AM, now taking 3 at a time but would prefer a 90mg  dose to take once a day  3. Are you having a reaction (difficulty breathing--STAT)? NO  4. What is your medication issue? Picked up a new prescription in the last week and the dosage is back to the 30 mg rather than 90mg 

## 2019-01-31 NOTE — Telephone Encounter (Signed)
Patient should be taking isosorbide mononitrate 90 mg daily.  Please refill medication as such.  Thanks.  Nelva Bush, MD [facility] Pager: [phone]

## 2019-02-01 MED ORDER — ISOSORBIDE MONONITRATE ER 30 MG PO TB24: 90.0000 mg | ORAL_TABLET | Freq: Every day | ORAL | 1 refills | Status: DC

## 2019-02-06 IMAGING — CT CT ANGIO CHEST
2 of 6 series · 19 of 36 positions shown · IV contrast (iopamidol)
Comparison: Chest x-ray on 03/30/2018

CLINICAL DATA: Pt c/o pain between shoulders and difficulty
breathing that began around 9 am today

EXAM:
CT ANGIOGRAPHY CHEST WITH CONTRAST
TECHNIQUE: Multidetector CT imaging of the chest was performed using the
standard protocol during bolus administration of intravenous
contrast. Multiplanar CT image reconstructions and MIPs were
obtained to evaluate the vascular anatomy.
CONTRAST:  100mL DD1XT1-2NS IOPAMIDOL (DD1XT1-2NS) INJECTION 76%

[Series 8: pe thins · axial · 0.72mm/px · z∈[+1021,+1255]mm · 18 of 373 slices shown]
[im 19/373  lung]
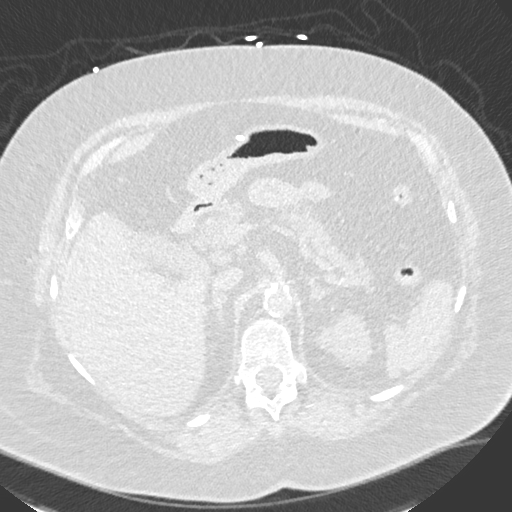
[im 38/373  mediastinal]
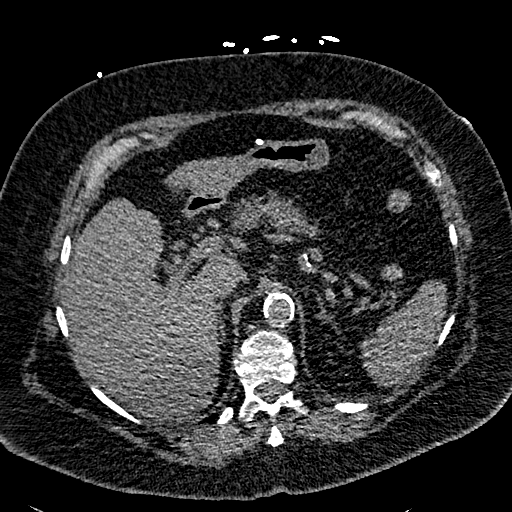
[im 56/373  lung]
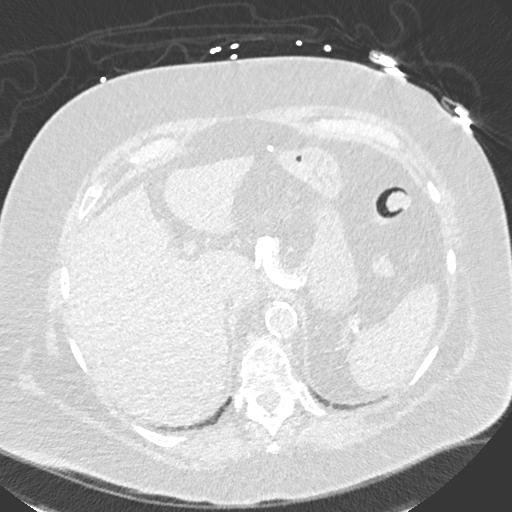
[im 75/373  mediastinal]
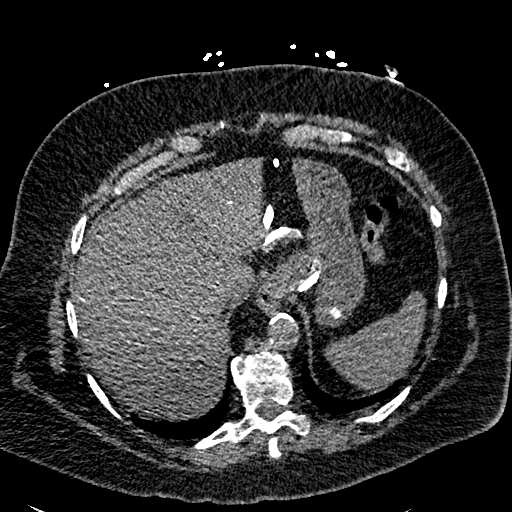
[im 94/373  lung]
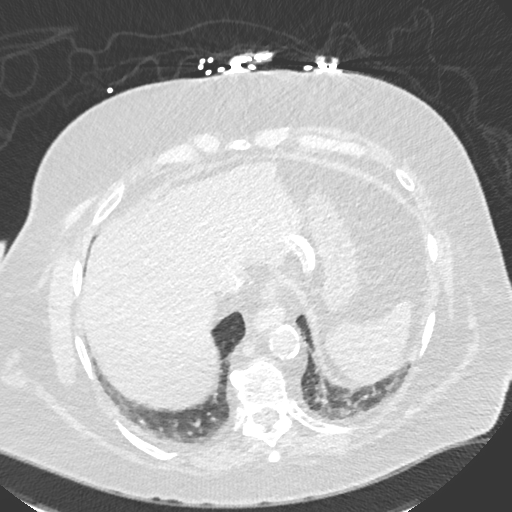
[im 112/373  mediastinal]
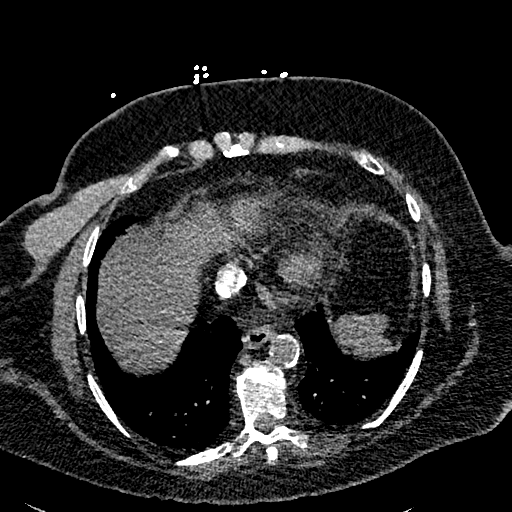
[im 131/373  lung]
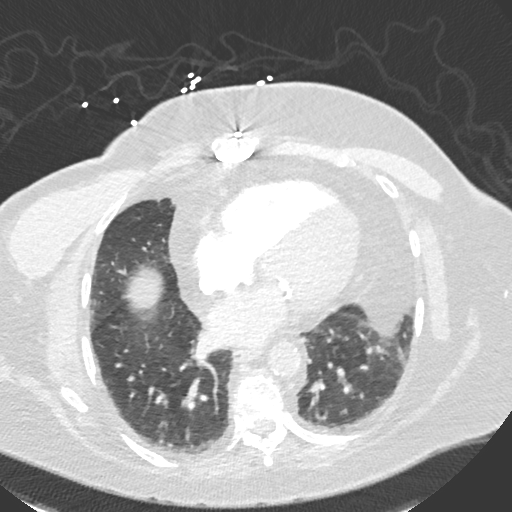
[im 149/373  mediastinal]
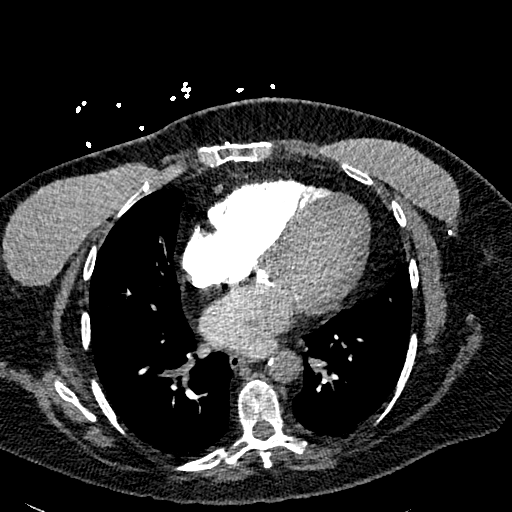
[im 168/373  lung]
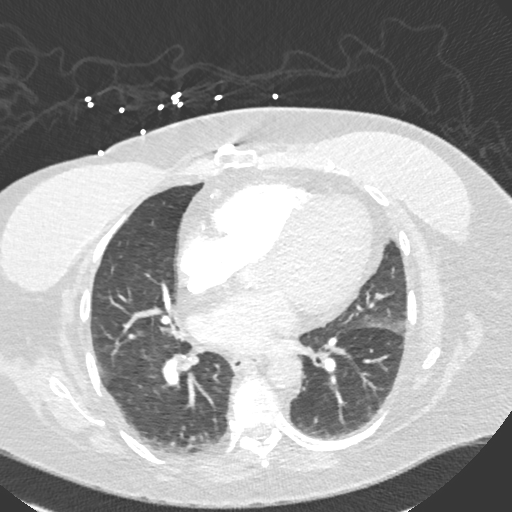
[im 205/373  mediastinal]
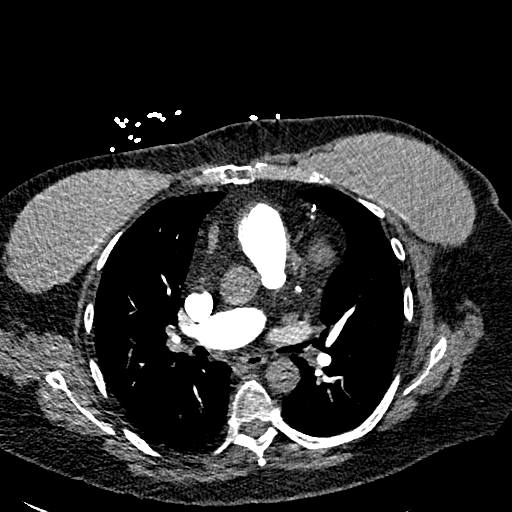
[im 224/373  lung]
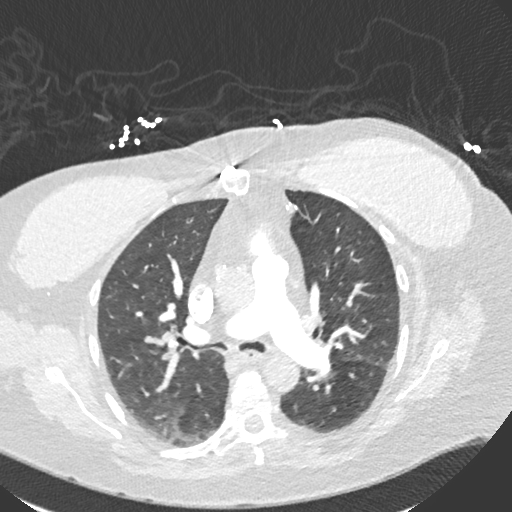
[im 242/373  mediastinal]
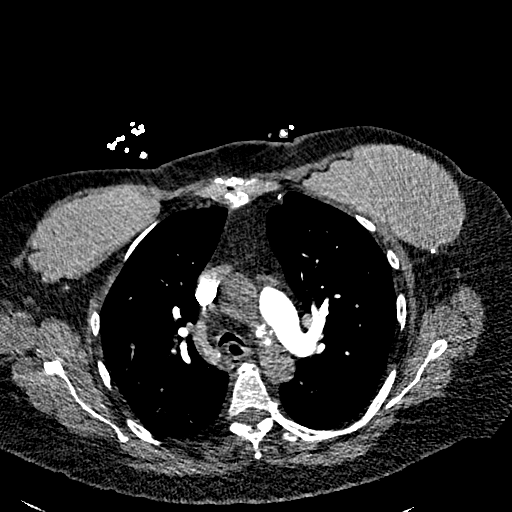
[im 261/373  lung]
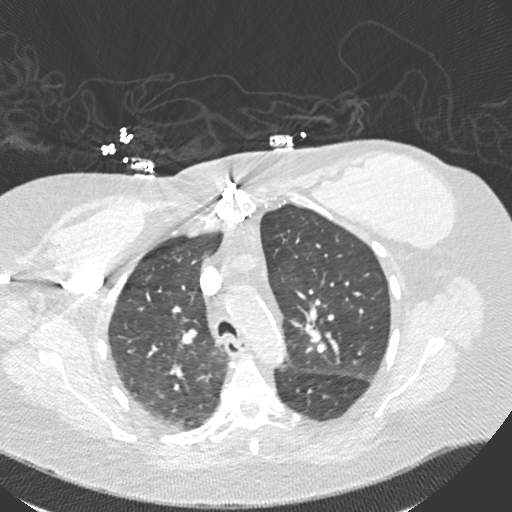
[im 280/373  mediastinal]
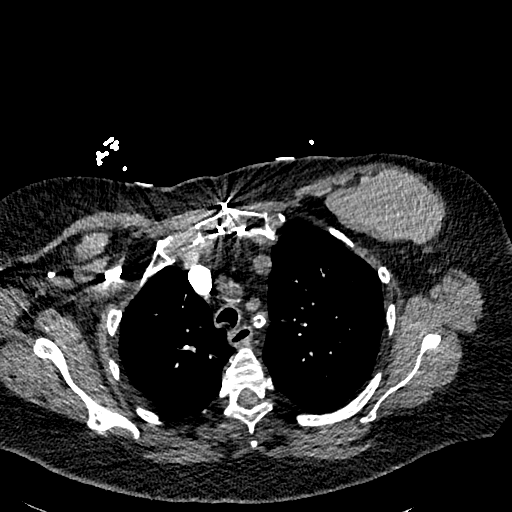
[im 298/373  lung]
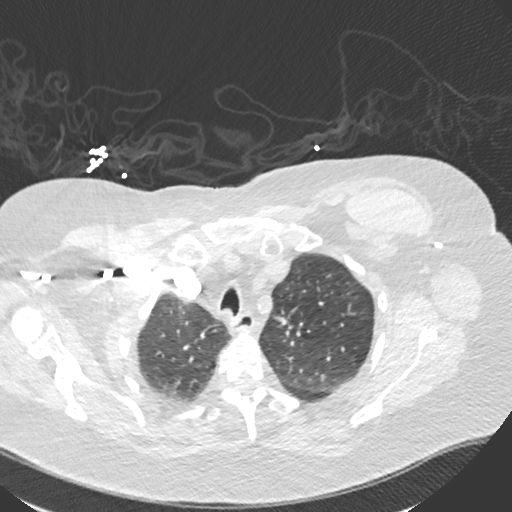
[im 317/373  mediastinal]
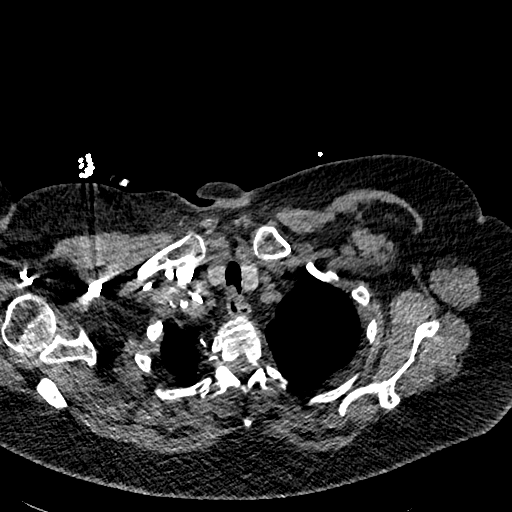
[im 335/373  lung]
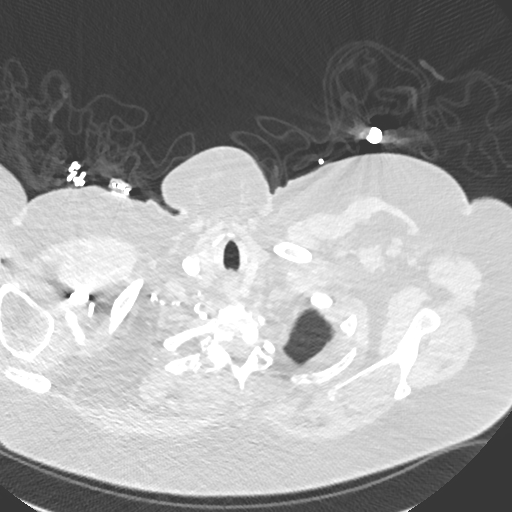
[im 354/373  mediastinal]
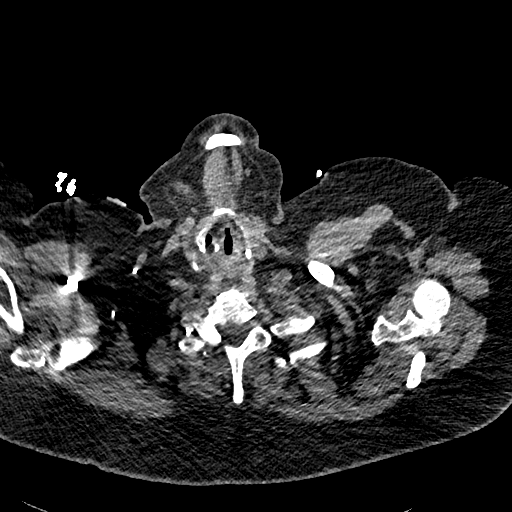

[Series 9: pe 2mm cor · coronal · 0.54mm/px · 1 of 151 slices shown]
[im 76/151  mediastinal]
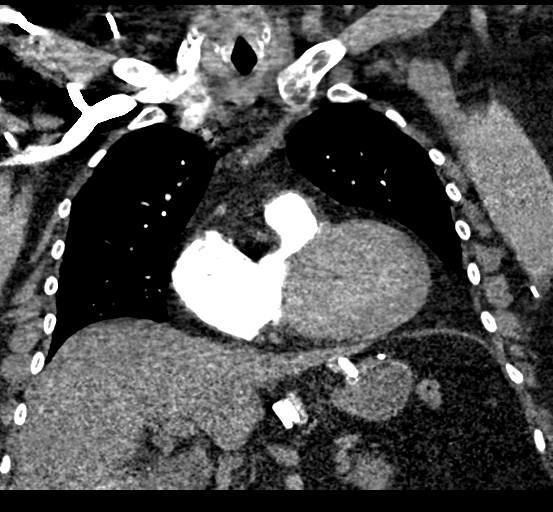

[19 of 36 positions shown; findings below may reference images not displayed]

FINDINGS: Cardiovascular: Status post median sternotomy and CABG. The heart is
mildly enlarged. There is atherosclerotic calcification of the
coronary arteries and thoracic aorta. No aneurysm.

Mediastinum/Nodes: The visualized portion of the thyroid gland has a
normal appearance. Esophagus is normal in appearance.

Lungs/Pleura: Small calcified granuloma is identified along the
periphery of the LEFT UPPER lobe on image 58 of series 7. No
suspicious pulmonary nodules. No pleural effusions or
consolidations.

Upper Abdomen: Patient has lap band surrounding the proximal
stomach, without evidence for obstruction. Somewhat tortuous
appearance of the distal esophagus. It is difficult to exclude
hiatal hernia. Gallbladder is present.

Musculoskeletal: Median sternotomy changes. No acute osseous
abnormalities.

Additional: The patient has bilateral retropectoral implants.
Surgical clips are noted in the LEFT axilla.

Review of the MIP images confirms the above findings.
IMPRESSION: 1. Technically adequate exam showing no acute pulmonary embolus.
2. Mild cardiomegaly; coronary artery disease and CABG.
3.  Aortic atherosclerosis.  ([id number])
4. Bilateral breast implants.  LEFT axillary node dissection.
5. No acute pulmonary abnormality.

## 2019-03-30 ENCOUNTER — Other Ambulatory Visit: Payer: Self-pay

## 2019-03-30 ENCOUNTER — Ambulatory Visit (INDEPENDENT_AMBULATORY_CARE_PROVIDER_SITE_OTHER): Payer: Medicare Other | Admitting: Cardiovascular Disease

## 2019-03-30 ENCOUNTER — Encounter: Payer: Self-pay | Admitting: Cardiovascular Disease

## 2019-03-30 VITALS — BP 100/60 | HR 66 | Ht 65.0 in | Wt 252.5 lb

## 2019-03-30 DIAGNOSIS — I25118 Atherosclerotic heart disease of native coronary artery with other forms of angina pectoris: Secondary | ICD-10-CM | POA: Diagnosis not present

## 2019-03-30 MED ORDER — ISOSORBIDE MONONITRATE ER 30 MG PO TB24: 30.0000 mg | ORAL_TABLET | Freq: Every day | ORAL | 3 refills | Status: DC

## 2019-03-30 NOTE — Progress Notes (Addendum)
Cardiology Office Note  Date:  03/30/2019   ID:  Jamie, [name] Dec 17, 1945, MRN [id number]  PCP:  Verdell Carmine., MD   Chief Complaint  Patient presents with  . Other    Patient c/o worsening SOB. meds reviewed verbally with patient.     HPI:  Jamie [name] is a 73 y.o. female with history of  coronary artery disease status post CABG (0000000),  diastolic heart failure,  hyperlipidemia,  carotid artery stenosis,  breast cancer status post bilateral mastectomies, diabetes mellitus,  COPD, prior smoker sleep apnea,   Obesity Cardiac cath 08/2017 for SOB:   Dr. Saunders Revel patient REDS VEST 29% on todays visit Tries to do activities , stops activity after minimal activity secondary to SOB Sx worse in past year Up 5 pounds past 6 months No edema  Does not use inhalers Does not seem to help, albuterol bevespi inhalers seemed to help little bit, too expensive  CR 1.2 in July 2020  On prior office visit had  worsening shortness of breath (her anginal equivalent).  increased isosorbide mononitrate to 90 mg daily.  Today only taking imdur 30 daily (mistake with meds) since 10/04/2018 BP low today  deconditioning , primary lung disease also contributing  previously prescribed the Bevespi inhaler but has been unable to afford this.    She denies chest pain and palpitations.  She had brief swelling of the right cheek but otherwise no edema.  This has resolved on its own.  Weight has been stable.  Cardiac cath 08/2017 1. Severe native coronary artery disease, including 70% ostial LMCA, 50% proximal LAD, and 100% mid RCA lesions. 2. Atretic LIMA->LAD. 3. Patent SVG->D1; jump portion to OM2 appears chronically occluded. 4. Widely patent SVG->RCA. 5. FFR of ostial LMCA and proximal LAD lesions is not significant (FFR 0.82), though this may be influenced by some retrograde from via the SVG->D. 6. Mildly elevated left heart, right heart, and pulmonary artery pressures. 7. Normal Fick  cardiac output/index. 8. Mild aortic stenosis.    PMH:   has a past medical history of Anxiety, Asthma, Breast cancer ([place]) (08/2011), CAD (coronary artery disease), Carotid artery disease ([place]), COPD (chronic obstructive pulmonary disease) ([place]), DM2 (diabetes mellitus, type 2) ([place]), Gallstones, HLD (hyperlipidemia), cardiovascular stress test, echocardiogram, migraines, Hypertension, Myocardial infarction ([place]), Obesity, Shortness of breath, Sleep apnea, and UTI (lower urinary tract infection).  PSH:    Past Surgical History:  Procedure Laterality Date  . ABDOMINAL HYSTERECTOMY  1977  . APPENDECTOMY    . AXILLARY LYMPH NODE DISSECTION  10/18/2011   Procedure: AXILLARY LYMPH NODE DISSECTION;  Surgeon: Merrie Roof, MD;  Location: [place];  Service: General;  Laterality: Left;  . BREAST RECONSTRUCTION  10/18/2011   Procedure: BREAST RECONSTRUCTION;  Surgeon: Theodoro Kos, DO;  Location: [place];  Service: Plastics;  Laterality: Bilateral;   bilateral breast reconstruction with bilateral tissue expander and placement of flex hd.  Marland Kitchen CARDIAC CATHETERIZATION    . CESAREAN SECTION  04/1976  . CHOLECYSTECTOMY N/A 08/17/2018   Procedure: LAPAROSCOPIC CHOLECYSTECTOMY;  Surgeon: Coralie Keens, MD;  Location: [facility];  Service: General;  Laterality: N/A;  . CORONARY ARTERY BYPASS GRAFT  12/13/2011   Procedure: CORONARY ARTERY BYPASS GRAFTING (CABG);  Surgeon: Gaye Pollack, MD;  Location: [facility];  Service: Open Heart Surgery;  Laterality: N/A;  Times four using endoscopically harvested left greater saphenous vein and left internal mammary artery. Right greater saphenous vein attempted; not appropriate for vein harvest.  .  EYE SURGERY     bilateral cataract extraction   . INTRAVASCULAR PRESSURE WIRE/FFR STUDY N/A 08/22/2017   Procedure: INTRAVASCULAR PRESSURE WIRE/FFR STUDY;  Surgeon: Nelva Bush, MD;  Location: [facility] CV LAB;  Service: Cardiovascular;  Laterality: N/A;  . LAPAROSCOPIC GASTRIC  BANDING  2010  . LEFT HEART CATHETERIZATION WITH CORONARY ANGIOGRAM N/A 12/11/2011   Procedure: LEFT HEART CATHETERIZATION WITH CORONARY ANGIOGRAM;  Surgeon: Larey Dresser, MD;  Location: [facility] CATH LAB;  Service: Cardiovascular;  Laterality: N/A;  . LESION REMOVAL  04/09/2012   Procedure: LESION REMOVAL;  Surgeon: Theodoro Kos, DO;  Location: [place];  Service: Plastics;  Laterality: Bilateral;  . MASTECTOMY W/ SENTINEL NODE BIOPSY  10/18/2011   Procedure: MASTECTOMY WITH SENTINEL LYMPH NODE BIOPSY;  Surgeon: Merrie Roof, MD;  Location: [place];  Service: General;  Laterality: Bilateral;  bilateral mastectomy and left sentinel node biopsy  . NASAL SINUS SURGERY    . PORT-A-CATH REMOVAL  04/09/2012   Procedure: REMOVAL PORT-A-CATH;  Surgeon: Merrie Roof, MD;  Location: [place];  Service: General;  Laterality: Right;  . PORTACATH PLACEMENT  12/02/2011   Procedure: INSERTION PORT-A-CATH;  Surgeon: Merrie Roof, MD;  Location: [place];  Service: General;  Laterality: Right;  . RIGHT/LEFT HEART CATH AND CORONARY/GRAFT ANGIOGRAPHY N/A 08/22/2017   Procedure: RIGHT/LEFT HEART CATH AND CORONARY/GRAFT ANGIOGRAPHY;  Surgeon: Nelva Bush, MD;  Location: [place] CV LAB;  Service: Cardiovascular;  Laterality: N/A;  . TONSILLECTOMY    . US ECHOCARDIOGRAPHY     at [place] NAVIGATION Left 11/26/2013   Procedure: VIDEO BRONCHOSCOPY WITH ENDOBRONCHIAL NAVIGATION;  Surgeon: Collene Gobble, MD;  Location: [facility];  Service: Thoracic;  Laterality: Left;    Current Outpatient Medications  Medication Sig Dispense Refill  . albuterol (PROVENTIL) (2.5 MG/3ML) 0.083% nebulizer solution Inhale 3 mLs into the lungs every 6 (six) hours as needed for wheezing or shortness of breath.  2  . amLODipine (NORVASC) 2.5 MG tablet Take 1 tablet by mouth once daily 90 tablet 0  . aspirin EC 81 MG tablet Take 1 tablet (81 mg total) by mouth  daily.    Marland Kitchen atorvastatin (LIPITOR) 80 MG tablet TAKE 1 TABLET BY MOUTH ONCE DAILY AT  6PM 90 tablet 3  . buPROPion (WELLBUTRIN XL) 300 MG 24 hr tablet Take 300 mg by mouth daily.     . cephALEXin (KEFLEX) 500 MG capsule Take 500 mg by mouth daily.     . Cholecalciferol (VITAMIN D3) 1000 units CAPS Take 1,000 Units by mouth daily.     . fluticasone (FLONASE) 50 MCG/ACT nasal spray Place 2 sprays into both nostrils daily.     . Glycopyrrolate-Formoterol (BEVESPI AEROSPHERE) 9-4.8 MCG/ACT AERO Inhale 2 puffs into the lungs daily.    . isosorbide mononitrate (IMDUR) 30 MG 24 hr tablet Take 3 tablets (90 mg total) by mouth daily. 270 tablet 1  . lisinopril (ZESTRIL) 10 MG tablet TAKE 1 TABLET BY MOUTH ONCE DAILY, NEED OFFICE VISIT 90 tablet 2  . loratadine (CLARITIN) 10 MG tablet Take 10 mg by mouth daily.    . metFORMIN (GLUCOPHAGE) 500 MG tablet Take 1 tablet (500 mg total) by mouth 2 (two) times daily. (Patient taking differently: Take 500 mg by mouth every evening. )    . metoprolol tartrate (LOPRESSOR) 25 MG tablet Take 1 tablet (25 mg total) by mouth 2 (two) times daily. [place]  tablet 3  . Multiple Vitamin (MULTI VITAMIN DAILY PO) Take 1 tablet by mouth daily.    . nitroGLYCERIN (NITROSTAT) 0.4 MG SL tablet Place 1 tablet (0.4 mg total) under the tongue every 5 (five) minutes as needed. 25 tablet 3  . Polyethyl Glycol-Propyl Glycol (SYSTANE) 0.4-0.3 % SOLN Place 1 drop into both eyes 4 (four) times daily as needed (for dry eyes).     . furosemide (LASIX) 40 MG tablet Take 0.5 tablets (20 mg total) by mouth daily. (Patient taking differently: Take 20 mg by mouth 2 (two) times daily. ) 45 tablet 3   No current facility-administered medications for this visit.      Allergies:   Penicillins, Vicodin [hydrocodone-acetaminophen], Sulfa antibiotics, and Sulfamethoxazole   Social History:  The patient  reports that she quit smoking about 12 years ago. Her smoking use included cigarettes. She has a 80.00  pack-year smoking history. She has never used smokeless tobacco. She reports current alcohol use. She reports that she does not use drugs.   Family History:   family history includes Breast cancer in her maternal aunt and maternal aunt; Heart disease in her father and mother; Ovarian cancer in her paternal aunt.    Review of Systems: Review of Systems  Constitutional: Negative.        Weight gain  HENT: Negative.   Respiratory: Positive for shortness of breath.   Cardiovascular: Negative.   Gastrointestinal: Negative.   Musculoskeletal: Negative.   Neurological: Negative.   Psychiatric/Behavioral: Negative.   All other systems reviewed and are negative.   PHYSICAL EXAM: VS:  BP 100/60 (BP Location: Right Arm, Patient Position: Sitting, Cuff Size: Normal)   Pulse 66   Ht 5\' 5"  (1.651 m)   Wt 252 lb 8 oz (114.5 kg)   BMI 42.02 kg/m  , BMI Body mass index is 42.02 kg/m. GEN: Well nourished, well developed, in no acute distress HEENT: normal Neck: no JVD, carotid bruits, or masses Cardiac: RRR; no murmurs, rubs, or gallops,no edema  Respiratory:  clear to auscultation bilaterally, normal work of breathing GI: soft, nontender, nondistended, + BS MS: no deformity or atrophy Skin: warm and dry, no rash Neuro:  Strength and sensation are intact Psych: euthymic mood, full affect   Recent Labs: 08/11/2018: ALT 22; BUN 19; Creatinine, Ser 1.21; Hemoglobin 13.7; Platelets 188; Potassium 3.6; Sodium 139    Lipid Panel Lab Results  Component Value Date   CHOL 126 03/10/2012   HDL 41.10 03/10/2012   LDLCALC 49 03/10/2012   TRIG 182.0 (H) 03/10/2012      Wt Readings from Last 3 Encounters:  03/30/19 252 lb 8 oz (114.5 kg)  12/16/18 247 lb (112 kg)  11/20/18 247 lb (112 kg)       ASSESSMENT AND PLAN:  Problem List Items Addressed This Visit      Cardiology Problems   Coronary artery disease of native heart with stable angina pectoris ([place]) - Primary   Relevant Orders    EKG 12-Lead     Shortness of breath Likely multifactorial though concerning for angina Known severe coronary disease based on catheterization June 2019 She reports symptoms are getting worse over the past year Blood pressure low, did not realize it but was only taking Imdur 30 and systolic pressure [id number] up to 110 on today's visit verified by myself REDS VEST score today is 29% indicating she is euvolemic.  Clinically also appears euvolemic ---We will discuss with Dr. Saunders Revel if she is a candidate  for recatheterization, out of concern for progression of her underlying coronary disease ---We have called pulmonary to have her reestablish care, was last seen 1 year ago [facility] in [place] Currently not on inhalers -Reports she is unable to exercise but morbid obesity likely contributing to her symptoms.  Potentially might be a candidate for respiratory rehab if she is willing.  -Might benefit from meeting with a dietitian, weight up an additional 5 pounds, she is approximately 90 to 100 pounds over her ideal weight  Hypertension Low blood pressure, only on Imdur 30, we will hold the amlodipine  COPD As above referral back to pulmonary, seen 1 year ago   Disposition:   F/U  1 month with Dr. Saunders Revel   Total encounter time more than 45 minutes  Greater than 50% was spent in counseling and coordination of care with the patient    Signed, Esmond Plants, M.D., Ph.D. [place], [place]

## 2019-03-30 NOTE — Patient Instructions (Addendum)
Medication Instructions:  Your physician has recommended you make the following change in your medication:  1. STOP Amlodipine (Norvasc) Blood pressure low 2. DECREASE Isosorbide mononitrate (Imdur) to 30 mg once daily   If you need a refill on your cardiac medications before your next appointment, please call your pharmacy.    Lab work: Lipid panel done today   If you have labs (blood work) drawn today and your tests are completely normal, you will receive your results only by: Marland Kitchen MyChart Message (if you have MyChart) OR . A paper copy in the mail If you have any lab test that is abnormal or we need to change your treatment, we will call you to review the results.   Testing/Procedures: No new testing needed   Follow-Up: At [facility], you and your health needs are our priority.  As part of our continuing mission to provide you with exceptional heart care, we have created designated Provider Care Teams.  These Care Teams include your primary Cardiologist (physician) and Advanced Practice Providers (APPs -  Physician Assistants and Nurse Practitioners) who all work together to provide you with the care you need, when you need it.  . You will need a follow up appointment 1 month with Dr. Harrell Gave End . SEE PULMONARY APPT INFO BELOW   . Providers on your designated Care Team:   . Murray Hodgkins, NP . Christell Hannie, PA-C . Marrianne Mood, PA-C  Any Other Special Instructions Will Be Listed Below (If Applicable).  For educational health videos Log in to : [email or web address] Or : [email or web address], password : [email or web address]  Jamie [name], [place]  [phone] (Work) [facility]  [address]  [address] [place], [place] [address]  [id number]  August 28th at 11:40AM  Please arrive 15 minutes early and have insurance and photo ID available. Please wear mask and one healthy person with mask can come with you.

## 2019-03-31 LAB — LIPID PANEL
Chol/HDL Ratio: 3.1 ratio (ref 0.0–4.4)
Cholesterol, Total: 131 mg/dL (ref 100–199)
HDL: 42 mg/dL (ref >39)
LDL Calculated: 51 mg/dL (ref 0–99)
Triglycerides: 192 mg/dL — ABNORMAL HIGH (ref 0–149)
VLDL Cholesterol Cal: 38 mg/dL (ref 5–40)

## 2019-04-01 ENCOUNTER — Encounter: Payer: Self-pay | Admitting: *Deleted

## 2019-04-06 ENCOUNTER — Ambulatory Visit: Payer: Medicare Other | Admitting: Physician Assistant

## 2019-05-05 ENCOUNTER — Encounter: Payer: Self-pay | Admitting: Internal Medicine

## 2019-05-05 ENCOUNTER — Ambulatory Visit (INDEPENDENT_AMBULATORY_CARE_PROVIDER_SITE_OTHER): Payer: Medicare Other | Admitting: Internal Medicine

## 2019-05-05 ENCOUNTER — Other Ambulatory Visit: Payer: Self-pay

## 2019-05-05 VITALS — BP 110/80 | HR 71 | Ht 65.0 in | Wt 254.2 lb

## 2019-05-05 DIAGNOSIS — I25118 Atherosclerotic heart disease of native coronary artery with other forms of angina pectoris: Secondary | ICD-10-CM

## 2019-05-05 DIAGNOSIS — I5032 Chronic diastolic (congestive) heart failure: Secondary | ICD-10-CM | POA: Diagnosis not present

## 2019-05-05 DIAGNOSIS — E785 Hyperlipidemia, unspecified: Secondary | ICD-10-CM

## 2019-05-05 DIAGNOSIS — I350 Nonrheumatic aortic (valve) stenosis: Secondary | ICD-10-CM

## 2019-05-05 DIAGNOSIS — J449 Chronic obstructive pulmonary disease, unspecified: Secondary | ICD-10-CM

## 2019-05-05 MED ORDER — LISINOPRIL 5 MG PO TABS: 5.0000 mg | ORAL_TABLET | Freq: Every day | ORAL | 3 refills | Status: DC

## 2019-05-05 MED ORDER — ISOSORBIDE MONONITRATE ER 30 MG PO TB24: 30.0000 mg | ORAL_TABLET | Freq: Two times a day (BID) | ORAL | 2 refills | Status: DC

## 2019-05-05 NOTE — Progress Notes (Signed)
Follow-up Outpatient Visit Date: 05/05/2019  Primary Care Provider: Verdell Carmine., MD [address] [place] [address]  Chief Complaint: Shortness of breath  HPI:  Jamie [name] is a 73 y.o. year-old female with history of coronary artery disease status post CABG (8299), diastolic heart failure, hyperlipidemia, carotid artery stenosis, breast cancer status post bilateral mastectomies, diabetes mellitus, COPD, sleep apnea, and obesity, who presents for follow-up of coronary artery disease and shortness of breath.  She was last seen a month ago by Dr. Rockey Situ due to worsening shortness of breath.  She was referred back to pulmonology.  Amlodipine was also held in the setting of soft blood pressure.  She notes that at some point isosorbide mononitrate prescription was changed from 30 mg twice daily to 30 mg daily.  For a while, she felt like the twice daily dosing has relieving her breathing quite a bit.  Today, [name] reports that her exertional dyspnea is about the same.  She gets short of breath walking from one room to the other in her home as well as standing in the kitchen, cooking.  She denies chest pain, palpitations, lightheadedness, and edema.  She was started on a new inhaler by her pulmonologist but first started using it yesterday and thinks it is too early to tell if it is helping her at all.  She reports having undergone a sleep study a year or two ago and was told that it showed mild OSA.  She was not started on CPAP.  She has slept in a chair for many years and repots that lying in bed is very painful for her back.  --------------------------------------------------------------------------------------------------  Past Medical History:  Diagnosis Date  . Anxiety   . Asthma   . Breast cancer ([facility]) 08/2011   lt. breast ca  . CAD (coronary artery disease)    NSTEMI 5/13:  LHC demonstrated oLM 75%, pLAD 80%, RCA occluded, filled by collaterals from the LAD.  Echo 12/10/11: EF  60%.; CABG 12/13/11: LIMA-LAD, SVG-RCA, SVG-diagonal and OM.   Marland Kitchen Carotid artery disease ([facility])    a.  Pre-CABG Dopplers 5/13: Bilateral 40-59%; b.  Carotid US 3/71: RICA 69-67%, LICA 89-38%, normal subclavians bilaterally >> FU 6 months;  c. Carotid US 10/17: RICA 51-02%; LICA 58-52%, normal subclavians >> FU 1 year // d. Carotid US 11/17:  Stable 40-59% bilateral ICA stenosis; F/U 1 year.  Marland Kitchen COPD (chronic obstructive pulmonary disease) ([facility])   . DM2 (diabetes mellitus, type 2) ([facility])    metformin  . Gallstones    Symptomatic  . HLD (hyperlipidemia)   . Hx of cardiovascular stress test    Myoview 5/16:  small area of inf-lat ischemia, EF 70%; Low Risk >> try medical Rx  . Hx of echocardiogram    Echo 5/16:  EF 60-65%, no RWMA, Gr 1 DD, Ao sclerosis, mild AS, mean 14 mmHg, MAC  . Hx of migraines   . Hypertension   . Myocardial infarction ([facility])   . Obesity   . Shortness of breath   . Sleep apnea    sleep study2-3 yrs ago lost weight afterward but gained back  . UTI (lower urinary tract infection)    freq-bladder implant -removed   Past Surgical History:  Procedure Laterality Date  . ABDOMINAL HYSTERECTOMY  1977  . APPENDECTOMY    . AXILLARY LYMPH NODE DISSECTION  10/18/2011   Procedure: AXILLARY LYMPH NODE DISSECTION;  Surgeon: Merrie Roof, MD;  Location: [facility];  Service: General;  Laterality: Left;  . BREAST RECONSTRUCTION  10/18/2011   Procedure: BREAST RECONSTRUCTION;  Surgeon: Theodoro Kos, DO;  Location: [facility];  Service: Plastics;  Laterality: Bilateral;   bilateral breast reconstruction with bilateral tissue expander and placement of flex hd.  Marland Kitchen CARDIAC CATHETERIZATION    . CESAREAN SECTION  04/1976  . CHOLECYSTECTOMY N/A 08/17/2018   Procedure: LAPAROSCOPIC CHOLECYSTECTOMY;  Surgeon: Coralie Keens, MD;  Location: [facility];  Service: General;  Laterality: N/A;  . CORONARY ARTERY BYPASS GRAFT  12/13/2011   Procedure: CORONARY ARTERY BYPASS GRAFTING (CABG);  Surgeon: Gaye Pollack, MD;  Location: [facility];  Service: Open Heart Surgery;  Laterality: N/A;  Times four using endoscopically harvested left greater saphenous vein and left internal mammary artery. Right greater saphenous vein attempted; not appropriate for vein harvest.  . EYE SURGERY     bilateral cataract extraction   . INTRAVASCULAR PRESSURE WIRE/FFR STUDY N/A 08/22/2017   Procedure: INTRAVASCULAR PRESSURE WIRE/FFR STUDY;  Surgeon: Nelva Bush, MD;  Location: [facility] CV LAB;  Service: Cardiovascular;  Laterality: N/A;  . LAPAROSCOPIC GASTRIC BANDING  2010  . LEFT HEART CATHETERIZATION WITH CORONARY ANGIOGRAM N/A 12/11/2011   Procedure: LEFT HEART CATHETERIZATION WITH CORONARY ANGIOGRAM;  Surgeon: Larey Dresser, MD;  Location: [facility] CATH LAB;  Service: Cardiovascular;  Laterality: N/A;  . LESION REMOVAL  04/09/2012   Procedure: LESION REMOVAL;  Surgeon: Theodoro Kos, DO;  Location: [facility];  Service: Plastics;  Laterality: Bilateral;  . MASTECTOMY W/ SENTINEL NODE BIOPSY  10/18/2011   Procedure: MASTECTOMY WITH SENTINEL LYMPH NODE BIOPSY;  Surgeon: Merrie Roof, MD;  Location: [facility];  Service: General;  Laterality: Bilateral;  bilateral mastectomy and left sentinel node biopsy  . NASAL SINUS SURGERY    . PORT-A-CATH REMOVAL  04/09/2012   Procedure: REMOVAL PORT-A-CATH;  Surgeon: Merrie Roof, MD;  Location: [facility];  Service: General;  Laterality: Right;  . PORTACATH PLACEMENT  12/02/2011   Procedure: INSERTION PORT-A-CATH;  Surgeon: Merrie Roof, MD;  Location: [facility];  Service: General;  Laterality: Right;  . RIGHT/LEFT HEART CATH AND CORONARY/GRAFT ANGIOGRAPHY N/A 08/22/2017   Procedure: RIGHT/LEFT HEART CATH AND CORONARY/GRAFT ANGIOGRAPHY;  Surgeon: Nelva Bush, MD;  Location: [facility] CV LAB;  Service: Cardiovascular;  Laterality: N/A;  . TONSILLECTOMY    . US ECHOCARDIOGRAPHY     at [facility]  Left 11/26/2013   Procedure: VIDEO BRONCHOSCOPY WITH ENDOBRONCHIAL NAVIGATION;  Surgeon: Collene Gobble, MD;  Location: [facility];  Service: Thoracic;  Laterality: Left;    Current Meds  Medication Sig  . albuterol (PROVENTIL) (2.5 MG/3ML) 0.083% nebulizer solution Inhale 3 mLs into the lungs every 6 (six) hours as needed for wheezing or shortness of breath.  Marland Kitchen aspirin EC 81 MG tablet Take 1 tablet (81 mg total) by mouth daily.  Marland Kitchen atorvastatin (LIPITOR) 80 MG tablet TAKE 1 TABLET BY MOUTH ONCE DAILY AT  6PM  . buPROPion (WELLBUTRIN XL) 300 MG 24 hr tablet Take 300 mg by mouth daily.   . cephALEXin (KEFLEX) 500 MG capsule Take 500 mg by mouth daily.   . Cholecalciferol (VITAMIN D3) 1000 units CAPS Take 1,000 Units by mouth daily.   . fluticasone (FLONASE) 50 MCG/ACT nasal spray Place 2 sprays into both nostrils daily.   . furosemide (LASIX) 40 MG tablet Take 0.5 tablets (20 mg total) by mouth daily. (Patient taking differently:  Take 20 mg by mouth 2 (two) times daily. )  . Glycopyrrolate-Formoterol (BEVESPI AEROSPHERE) 9-4.8 MCG/ACT AERO Inhale 2 puffs into the lungs daily.  . isosorbide mononitrate (IMDUR) 30 MG 24 hr tablet Take 1 tablet (30 mg total) by mouth daily.  Marland Kitchen lisinopril (ZESTRIL) 10 MG tablet TAKE 1 TABLET BY MOUTH ONCE DAILY, NEED OFFICE VISIT  . loratadine (CLARITIN) 10 MG tablet Take 10 mg by mouth daily.  . metFORMIN (GLUCOPHAGE) 500 MG tablet Take 1 tablet (500 mg total) by mouth 2 (two) times daily. (Patient taking differently: Take 500 mg by mouth every evening. )  . metoprolol tartrate (LOPRESSOR) 25 MG tablet Take 1 tablet (25 mg total) by mouth 2 (two) times daily.  . Multiple Vitamin (MULTI VITAMIN DAILY PO) Take 1 tablet by mouth daily.  . nitroGLYCERIN (NITROSTAT) 0.4 MG SL tablet Place 1 tablet (0.4 mg total) under the tongue every 5 (five) minutes as needed.  Vladimir Faster Glycol-Propyl Glycol (SYSTANE) 0.4-0.3 % SOLN Place 1 drop into both eyes 4 (four) times daily as  needed (for dry eyes).     Allergies: Penicillins, Vicodin [hydrocodone-acetaminophen], Sulfa antibiotics, and Sulfamethoxazole  Social History   Tobacco Use  . Smoking status: Former Smoker    Packs/day: 2.00    Years: 40.00    Pack years: 80.00    Types: Cigarettes    Quit date: 08/25/2006    Years since quitting: 12.7  . Smokeless tobacco: Never Used  Substance Use Topics  . Alcohol use: Yes    Comment: 1-2 times a year  . Drug use: No    Family History  Problem Relation Age of Onset  . Heart disease Mother   . Heart disease Father   . Breast cancer Maternal Aunt   . Ovarian cancer Paternal Aunt   . Breast cancer Maternal Aunt   . Anesthesia problems Neg Hx   . Hypotension Neg Hx   . Malignant hyperthermia Neg Hx   . Pseudochol deficiency Neg Hx     Review of Systems: A 12-system review of systems was performed and was negative except as noted in the HPI.  --------------------------------------------------------------------------------------------------  Physical Exam: BP 110/80 (BP Location: Left Arm, Patient Position: Sitting, Cuff Size: Large)   Pulse 71   Ht _0  (1.651 m)   Wt 254 lb 4 oz (115.3 kg)   SpO2 97%   BMI 42.31 kg/m   General:  NAD HEENT: No conjunctival pallor or scleral icterus. Neck: Supple without lymphadenopathy, thyromegaly, JVD, or HJR, though evaluation is limited by body habitus. Lungs: Normal work of breathing. Clear to auscultation bilaterally without wheezes or crackles. Heart: Regular rate and rhythm with 2/6 systolic murmur. Abd: Bowel sounds present. Soft, NT/ND. Ext: No lower extremity edema. Radial, PT, and DP pulses are 2+ bilaterally. Skin: Warm and dry without rash.  EKG:  NSR with non-specific ST segment changes.  No significant change since 03/30/2019.  Lab Results  Component Value Date   WBC 6.7 08/11/2018   HGB 13.7 08/11/2018   HCT 44.2 08/11/2018   MCV 92.5 08/11/2018   PLT 188 08/11/2018    Lab Results   Component Value Date   NA 139 08/11/2018   K 3.6 08/11/2018   CL 106 08/11/2018   CO2 24 08/11/2018   BUN 19 08/11/2018   CREATININE 1.21 (H) 08/11/2018   GLUCOSE 150 (H) 08/11/2018   ALT 22 08/11/2018    Lab Results  Component Value Date   CHOL 131 03/30/2019  HDL 42 03/30/2019   LDLCALC 51 03/30/2019   TRIG 192 (H) 03/30/2019   CHOLHDL 3.1 03/30/2019    --------------------------------------------------------------------------------------------------  ASSESSMENT AND PLAN: Coronary artery disease with stable angina: Ms. Walls feels about the same as at our last visit.  Though she denies chest pain, she continues to have significant shortness of breath with minimal activity that is likely multifactorial.  I suspect that some degree of coronary insufficiency is contributing.  We will increase isosorbide mononitrate to 30 mg BID and continue metoprolol tartrate 25 mg daily.  We will continue aspirin and atorvastatin for secondary prevention.  If dyspnea is unchanged despite continued use of recently added inhaler and echocardiogram does not show significant progression in aortic stenosis, we will need to consider repeat catheterization and PCI to ostial LMCA  Aortic stenosis and chronic HFpEF: Ms. Gorsline is grossly euvolemic, though body habitus limits evaluation.  She was noted to have mild AS on most recent echo in 2018.  Given increased shortness of breath since then, we will repeat an echocardiogram.  Continue furosemide 20 mg daily.  Hypertension: BP is low normal again today.  Given escalation of isosorbide mononitrate to 30 mg BID, we will decrease lisinopril to 5 mg daily to minimize risk for hypotension.  Hyperlipidemia: LDL at goal.   Continue atorvastatin 80 mg daily.  Work on lifestyle modifications to help lower triglycerides.  If hypertriglyceridemia persists, Vascepa may be a consideration.  COPD: Mild based on recent assessment by pulmonary at [facility].  Continue  current inhalers.  Weight loss also encouraged, as morbid obesity and deconditioning are likely contributing to her shortness of breath.  Morbid obesity: BMI > 40 with multiple comorbidities.  Weight loss through diet and exercise encouraged.  Follow-up: RTC 1 month.  Nelva Bush, MD 05/05/2019 11:27 AM

## 2019-05-05 NOTE — Patient Instructions (Signed)
Medication Instructions:  Your physician has recommended you make the following change in your medication:  1- DECREASE Lisinopril to 5 mg by mouth once a day. 2- CHANGE Imdur to 30 mg by mouth two times a day.  If you need a refill on your cardiac medications before your next appointment, please call your pharmacy.   Lab work: NONE If you have labs (blood work) drawn today and your tests are completely normal, you will receive your results only by: Marland Kitchen MyChart Message (if you have MyChart) OR . A paper copy in the mail If you have any lab test that is abnormal or we need to change your treatment, we will call you to review the results.  Testing/Procedures: Your physician has requested that you have an echocardiogram at [facility]. Echocardiography is a painless test that uses sound waves to create images of your heart. It provides your doctor with information about the size and shape of your heart and how well your heart's chambers and valves are working. This procedure takes approximately one hour. There are no restrictions for this procedure. You may get an IV, if needed, to receive an ultrasound enhancing agent through to better visualize your heart.     Follow-Up: At [facility], you and your health needs are our priority.  As part of our continuing mission to provide you with exceptional heart care, we have created designated Provider Care Teams.  These Care Teams include your primary Cardiologist (physician) and Advanced Practice Providers (APPs -  Physician Assistants and Nurse Practitioners) who all work together to provide you with the care you need, when you need it. You will need a follow up appointment in 1 months. You may see Nelva Bush, MD or one of the following Advanced Practice Providers on your designated Care Team:   Murray Hodgkins, NP Christell Valjean, PA-C . Marrianne Mood, PA-C    Echocardiogram An echocardiogram is a procedure that uses painless sound  waves (ultrasound) to produce an image of the heart. Images from an echocardiogram can provide important information about:  Signs of coronary artery disease (CAD).  Aneurysm detection. An aneurysm is a weak or damaged part of an artery wall that bulges out from the normal force of blood pumping through the body.  Heart size and shape. Changes in the size or shape of the heart can be associated with certain conditions, including heart failure, aneurysm, and CAD.  Heart muscle function.  Heart valve function.  Signs of a past heart attack.  Fluid buildup around the heart.  Thickening of the heart muscle.  A tumor or infectious growth around the heart valves. Tell a health care provider about:  Any allergies you have.  All medicines you are taking, including vitamins, herbs, eye drops, creams, and over-the-counter medicines.  Any blood disorders you have.  Any surgeries you have had.  Any medical conditions you have.  Whether you are pregnant or may be pregnant. What are the risks? Generally, this is a safe procedure. However, problems may occur, including:  Allergic reaction to dye (contrast) that may be used during the procedure. What happens before the procedure? No specific preparation is needed. You may eat and drink normally. What happens during the procedure?   An IV tube may be inserted into one of your veins.  You may receive contrast through this tube. A contrast is an injection that improves the quality of the pictures from your heart.  A gel will be applied to your chest.  A wand-like tool (transducer) will be moved over your chest. The gel will help to transmit the sound waves from the transducer.  The sound waves will harmlessly bounce off of your heart to allow the heart images to be captured in real-time motion. The images will be recorded on a computer. The procedure may vary among health care providers and hospitals. What happens after the procedure?   You may return to your normal, everyday life, including diet, activities, and medicines, unless your health care provider tells you not to do that. Summary  An echocardiogram is a procedure that uses painless sound waves (ultrasound) to produce an image of the heart.  Images from an echocardiogram can provide important information about the size and shape of your heart, heart muscle function, heart valve function, and fluid buildup around your heart.  You do not need to do anything to prepare before this procedure. You may eat and drink normally.  After the echocardiogram is completed, you may return to your normal, everyday life, unless your health care provider tells you not to do that. This information is not intended to replace advice given to you by your health care provider. Make sure you discuss any questions you have with your health care provider. Document Released: 07/19/2000 Document Revised: 11/12/2018 Document Reviewed: 08/24/2016 Elsevier Patient Education  2020 [employer].

## 2019-05-18 ENCOUNTER — Ambulatory Visit (HOSPITAL_COMMUNITY): Payer: Medicare Other

## 2019-05-24 ENCOUNTER — Telehealth (HOSPITAL_COMMUNITY): Payer: Self-pay

## 2019-05-24 NOTE — Telephone Encounter (Signed)
New message    Just an FYI. We have made several attempts to contact this patient including sending a letter to schedule or reschedule their echocardiogram. We will be removing the patient from the echo WQ.   10.14.20 @ 2:15pm lm on cell phone vm to call Ailine Hefferan  [phone] 10.13.20 no show  10.5.20 @ 12:24pm lm on home vm Tayana Shankle  9.30.20 @ 3:00pm lm on cell phone vm  - Aquilla Shambley

## 2019-06-01 ENCOUNTER — Telehealth: Payer: Self-pay | Admitting: *Deleted

## 2019-06-01 NOTE — Telephone Encounter (Signed)
Will forward to scheduling to please reach out to the patient to see if she would like to r/s her echo prior to her follow up, which may need to be moved out.

## 2019-06-01 NOTE — Telephone Encounter (Signed)
-----   Message from Horton Finer sent at 06/01/2019 12:33 PM EDT ----- Regarding: echo-FYI Patient has upcoming appointment scheduled for 06/09/2019. Did not show for echo on 10//13/2020

## 2019-06-02 NOTE — Telephone Encounter (Signed)
Patient has been rescheduled for 11/3  Please advise if office visit should be moved

## 2019-06-02 NOTE — Telephone Encounter (Signed)
Thank you for rescheduling her. She is fine for her 11/4 appt with Dr. Saunders Revel if her echo is on 11/3. Thanks!

## 2019-06-07 ENCOUNTER — Other Ambulatory Visit: Payer: Self-pay | Admitting: Internal Medicine

## 2019-06-07 MED ORDER — FUROSEMIDE 40 MG PO TABS: 20.0000 mg | ORAL_TABLET | Freq: Every day | ORAL | 3 refills | Status: DC

## 2019-06-07 MED ORDER — FUROSEMIDE 20 MG PO TABS: 20.0000 mg | ORAL_TABLET | Freq: Two times a day (BID) | ORAL | 3 refills | Status: DC

## 2019-06-07 NOTE — Telephone Encounter (Signed)
Refill sent for Furosemide 40 mg take 1/2 tablet daily.

## 2019-06-08 ENCOUNTER — Ambulatory Visit (HOSPITAL_COMMUNITY): Payer: Medicare Other | Attending: Cardiology

## 2019-06-08 ENCOUNTER — Other Ambulatory Visit: Payer: Self-pay

## 2019-06-08 DIAGNOSIS — I350 Nonrheumatic aortic (valve) stenosis: Secondary | ICD-10-CM | POA: Diagnosis present

## 2019-06-08 DIAGNOSIS — I5032 Chronic diastolic (congestive) heart failure: Secondary | ICD-10-CM | POA: Diagnosis present

## 2019-06-08 MED ORDER — PERFLUTREN LIPID MICROSPHERE
1.0000 mL | INTRAVENOUS | Status: AC | PRN
  Administered 2019-06-08: 2 mL via INTRAVENOUS

## 2019-06-09 ENCOUNTER — Ambulatory Visit (INDEPENDENT_AMBULATORY_CARE_PROVIDER_SITE_OTHER): Payer: Medicare Other | Admitting: Internal Medicine

## 2019-06-09 ENCOUNTER — Encounter: Payer: Self-pay | Admitting: Internal Medicine

## 2019-06-09 VITALS — BP 128/68 | HR 65 | Temp 97.2°F | Ht 65.0 in | Wt 253.0 lb

## 2019-06-09 DIAGNOSIS — I25118 Atherosclerotic heart disease of native coronary artery with other forms of angina pectoris: Secondary | ICD-10-CM | POA: Diagnosis not present

## 2019-06-09 DIAGNOSIS — I10 Essential (primary) hypertension: Secondary | ICD-10-CM | POA: Diagnosis not present

## 2019-06-09 DIAGNOSIS — I350 Nonrheumatic aortic (valve) stenosis: Secondary | ICD-10-CM | POA: Diagnosis not present

## 2019-06-09 DIAGNOSIS — E785 Hyperlipidemia, unspecified: Secondary | ICD-10-CM | POA: Diagnosis not present

## 2019-06-09 MED ORDER — ISOSORBIDE MONONITRATE ER 60 MG PO TB24: 60.0000 mg | ORAL_TABLET | Freq: Two times a day (BID) | ORAL | 1 refills | Status: DC

## 2019-06-09 NOTE — Progress Notes (Signed)
Follow-up Outpatient Visit Date: 06/09/2019  Primary Care Provider: Verdell Carmine., MD [address] [place] [address]  Chief Complaint: Shortness of breath  HPI:  Ms. Vossler is a 73 y.o. year-old female with history of coronary artery disease status post CABG (9163), diastolic heart failure, hyperlipidemia, carotid artery stenosis, breast cancer status post bilateral mastectomies, diabetes mellitus, COPD, sleep apnea, and obesity, who presents for follow-up of shortness of breath and coronary artery disease.  I last saw her on 05/05/2019, at which time Ms. Brigitte Pulse reported stable exertional dyspnea present with mild activity such as walking from room to room in her home.  She remained without chest pain but again noted that dyspnea has been her anginal equivalent.  We agreed to increase isosorbide mononitrate to 30 mg twice daily and continue use of recently added inhaler for possible underlying lung disease.  Echocardiogram performed yesterday showed LVEF of 60 to 65% with grade 1 diastolic dysfunction and moderate aortic stenosis (mean gradient 20 mmHg).  Today, Ms. Strubel reports that she feels about the same as at our last visit.  She still has shortness of breath with minimal activity.  This did not change significantly with escalation of isosorbide mononitrate.  She denies chest pain, palpitations, and lightheadedness.  She is not able to lie flat due to chronic back pain rather than orthopnea.  --------------------------------------------------------------------------------------------------  Past Medical History:  Diagnosis Date   Anxiety    Asthma    Breast cancer ([facility]) 08/2011   lt. breast ca   CAD (coronary artery disease)    NSTEMI 5/13:  LHC demonstrated oLM 75%, pLAD 80%, RCA occluded, filled by collaterals from the LAD.  Echo 12/10/11: EF 60%.; CABG 12/13/11: LIMA-LAD, SVG-RCA, SVG-diagonal and OM.    Carotid artery disease ([facility])    a.  Pre-CABG Dopplers 5/13:  Bilateral 40-59%; b.  Carotid US 8/46: RICA 65-99%, LICA 35-70%, normal subclavians bilaterally >> FU 6 months;  c. Carotid US 17/79: RICA 39-03%; LICA 00-92%, normal subclavians >> FU 1 year // d. Carotid US 11/17:  Stable 40-59% bilateral ICA stenosis; F/U 1 year.   COPD (chronic obstructive pulmonary disease) (HCC)    DM2 (diabetes mellitus, type 2) (HCC)    metformin   Gallstones    Symptomatic   HLD (hyperlipidemia)    Hx of cardiovascular stress test    Myoview 5/16:  small area of inf-lat ischemia, EF 70%; Low Risk >> try medical Rx   Hx of echocardiogram    Echo 5/16:  EF 60-65%, no RWMA, Gr 1 DD, Ao sclerosis, mild AS, mean 14 mmHg, MAC   Hx of migraines    Hypertension    Myocardial infarction ([facility])    Obesity    Shortness of breath    Sleep apnea    sleep study2-3 yrs ago lost weight afterward but gained back   UTI (lower urinary tract infection)    freq-bladder implant -removed   Past Surgical History:  Procedure Laterality Date   ABDOMINAL HYSTERECTOMY  1977   APPENDECTOMY     AXILLARY LYMPH NODE DISSECTION  10/18/2011   Procedure: AXILLARY LYMPH NODE DISSECTION;  Surgeon: Merrie Roof, MD;  Location: [facility];  Service: General;  Laterality: Left;   BREAST RECONSTRUCTION  10/18/2011   Procedure: BREAST RECONSTRUCTION;  Surgeon: Theodoro Kos, DO;  Location: [facility];  Service: Plastics;  Laterality: Bilateral;   bilateral breast reconstruction with bilateral tissue expander and placement of flex hd.   CARDIAC CATHETERIZATION  CESAREAN SECTION  04/1976   CHOLECYSTECTOMY N/A 08/17/2018   Procedure: LAPAROSCOPIC CHOLECYSTECTOMY;  Surgeon: Coralie Keens, MD;  Location: [facility];  Service: General;  Laterality: N/A;   CORONARY ARTERY BYPASS GRAFT  12/13/2011   Procedure: CORONARY ARTERY BYPASS GRAFTING (CABG);  Surgeon: Gaye Pollack, MD;  Location: [facility];  Service: Open Heart Surgery;  Laterality: N/A;  Times four using endoscopically harvested left  greater saphenous vein and left internal mammary artery. Right greater saphenous vein attempted; not appropriate for vein harvest.   EYE SURGERY     bilateral cataract extraction    INTRAVASCULAR PRESSURE WIRE/FFR STUDY N/A 08/22/2017   Procedure: INTRAVASCULAR PRESSURE WIRE/FFR STUDY;  Surgeon: Nelva Bush, MD;  Location: [facility];  Service: Cardiovascular;  Laterality: N/A;   LAPAROSCOPIC GASTRIC BANDING  2010   LEFT HEART CATHETERIZATION WITH CORONARY ANGIOGRAM N/A 12/11/2011   Procedure: LEFT HEART CATHETERIZATION WITH CORONARY ANGIOGRAM;  Surgeon: Larey Dresser, MD;  Location: [facility];  Service: Cardiovascular;  Laterality: N/A;   LESION REMOVAL  04/09/2012   Procedure: LESION REMOVAL;  Surgeon: Theodoro Kos, DO;  Location: [facility];  Service: Plastics;  Laterality: Bilateral;   MASTECTOMY W/ SENTINEL NODE BIOPSY  10/18/2011   Procedure: MASTECTOMY WITH SENTINEL LYMPH NODE BIOPSY;  Surgeon: Merrie Roof, MD;  Location: [facility]  Service: General;  Laterality: Bilateral;  bilateral mastectomy and left sentinel node biopsy   NASAL SINUS SURGERY     PORT-A-CATH REMOVAL  04/09/2012   Procedure: REMOVAL PORT-A-CATH;  Surgeon: Merrie Roof, MD;  Location: [facility];  Service: General;  Laterality: Right;   PORTACATH PLACEMENT  12/02/2011   Procedure: INSERTION PORT-A-CATH;  Surgeon: Merrie Roof, MD;  Location: [facility];  Service: General;  Laterality: Right;   RIGHT/LEFT HEART CATH AND CORONARY/GRAFT ANGIOGRAPHY N/A 08/22/2017   Procedure: RIGHT/LEFT HEART CATH AND CORONARY/GRAFT ANGIOGRAPHY;  Surgeon: Nelva Bush, MD;  Location: [facility];  Service: Cardiovascular;  Laterality: N/A;   TONSILLECTOMY     US ECHOCARDIOGRAPHY     at [facility] Left 11/26/2013   Procedure: VIDEO BRONCHOSCOPY WITH ENDOBRONCHIAL NAVIGATION;  Surgeon: Collene Gobble, MD;  Location: [facility];   Service: Thoracic;  Laterality: Left;    Current Meds  Medication Sig   albuterol (PROVENTIL) (2.5 MG/3ML) 0.083% nebulizer solution Inhale 3 mLs into the lungs every 6 (six) hours as needed for wheezing or shortness of breath.   aspirin EC 81 MG tablet Take 1 tablet (81 mg total) by mouth daily.   atorvastatin (LIPITOR) 80 MG tablet TAKE 1 TABLET BY MOUTH ONCE DAILY AT  6PM   buPROPion (WELLBUTRIN XL) 300 MG 24 hr tablet Take 300 mg by mouth daily.    cephALEXin (KEFLEX) 500 MG capsule Take 500 mg by mouth daily.    Cholecalciferol (VITAMIN D3) 1000 units CAPS Take 1,000 Units by mouth daily.    fluticasone (FLONASE) 50 MCG/ACT nasal spray Place 2 sprays into both nostrils daily.    Fluticasone-Salmeterol (ADVAIR) 250-50 MCG/DOSE AEPB Inhale into the lungs.   furosemide (LASIX) 40 MG tablet Take 0.5 tablets (20 mg total) by mouth daily.   Glycopyrrolate-Formoterol (BEVESPI AEROSPHERE) 9-4.8 MCG/ACT AERO Inhale 2 puffs into the lungs daily.   lisinopril (ZESTRIL) 5 MG tablet Take 1 tablet (5 mg total) by mouth daily.   loratadine (CLARITIN) 10 MG tablet Take 10 mg by mouth daily.  metFORMIN (GLUCOPHAGE) 500 MG tablet Take 1 tablet (500 mg total) by mouth 2 (two) times daily. (Patient taking differently: Take 500 mg by mouth every evening. )   metoprolol tartrate (LOPRESSOR) 25 MG tablet Take 1 tablet (25 mg total) by mouth 2 (two) times daily.   Multiple Vitamin (MULTI VITAMIN DAILY PO) Take 1 tablet by mouth daily.   nitroGLYCERIN (NITROSTAT) 0.4 MG SL tablet Place 1 tablet (0.4 mg total) under the tongue every 5 (five) minutes as needed.   Polyethyl Glycol-Propyl Glycol (SYSTANE) 0.4-0.3 % SOLN Place 1 drop into both eyes 4 (four) times daily as needed (for dry eyes).    [DISCONTINUED] isosorbide mononitrate (IMDUR) 30 MG 24 hr tablet Take 1 tablet (30 mg total) by mouth 2 (two) times daily.    Allergies: Penicillins, Vicodin [hydrocodone-acetaminophen], Sulfa  antibiotics, and Sulfamethoxazole  Social History   Tobacco Use   Smoking status: Former Smoker    Packs/day: 2.00    Years: 40.00    Pack years: 80.00    Types: Cigarettes    Quit date: 08/25/2006    Years since quitting: 12.7   Smokeless tobacco: Never Used  Substance Use Topics   Alcohol use: Yes    Comment: 1-2 times a year   Drug use: No    Family History  Problem Relation Age of Onset   Heart disease Mother    Heart disease Father    Breast cancer Maternal Aunt    Ovarian cancer Paternal Aunt    Breast cancer Maternal Aunt    Anesthesia problems Neg Hx    Hypotension Neg Hx    Malignant hyperthermia Neg Hx    Pseudochol deficiency Neg Hx     Review of Systems: A 12-system review of systems was performed and was negative except as noted in the HPI.  --------------------------------------------------------------------------------------------------  Physical Exam: BP 128/68 (BP Location: Right Arm, Patient Position: Sitting, Cuff Size: Large)    Pulse 65    Temp (!) 97.2 F (36.2 C)    Ht _0  (1.651 m)    Wt 253 lb (114.8 kg)    BMI 42.10 kg/m   General: NAD. HEENT: No conjunctival pallor or scleral icterus.  Facemask in place.  Neck: Supple without lymphadenopathy, thyromegaly, JVD, or HJR. Lungs: Normal work of breathing. Clear to auscultation bilaterally without wheezes or crackles. Heart: Distant heart sounds.  Regular rate and rhythm with 1/6 systolic murmur.  No rubs or gallops.  Unable to assess PMI due to body habitus. Abd: Bowel sounds present. Soft, NT/ND without hepatosplenomegaly Ext: Trace pretibial edema.  2+ radial pulses bilaterally. Skin: Warm and dry without rash.  EKG: Normal sinus rhythm with inferior Q waves.  Otherwise, no significant abnormality.  Lab Results  Component Value Date   WBC 6.7 08/11/2018   HGB 13.7 08/11/2018   HCT 44.2 08/11/2018   MCV 92.5 08/11/2018   PLT 188 08/11/2018    Lab Results  Component  Value Date   NA 139 08/11/2018   K 3.6 08/11/2018   CL 106 08/11/2018   CO2 24 08/11/2018   BUN 19 08/11/2018   CREATININE 1.21 (H) 08/11/2018   GLUCOSE 150 (H) 08/11/2018   ALT 22 08/11/2018    Lab Results  Component Value Date   CHOL 131 03/30/2019   HDL 42 03/30/2019   LDLCALC 51 03/30/2019   TRIG 192 (H) 03/30/2019   CHOLHDL 3.1 03/30/2019    --------------------------------------------------------------------------------------------------  ASSESSMENT AND PLAN: Coronary artery disease with stable angina:  Unfortunate, Ms. reader denies any significant improvement with recent escalation of isosorbide mononitrate.  It should be noted that her primary symptom is shortness of breath, which has been an anginal equivalent in the past.  She appears grossly euvolemic on exam today.  Recent echo showed preserved LVEF with mild to moderate aortic stenosis.  We have agreed to increase isosorbide mononitrate to 60 mg twice daily in an effort to control her symptoms.  If she does not have any significant improvement, we will reconsider PCI to the ostial LMCA and/or pulmonary consultation.  Aortic stenosis: Mild to moderate aortic stenosis with mean gradient of 20 mmHg noted on yesterday's echo.  We will continue clinical follow-up.  Hypertension: Blood pressure well controlled.  As above, we will increase isosorbide mononitrate to 60 mg twice daily.  If blood pressure drops significantly or Ms. Sasaki has symptoms of hypotension, I have asked her to stop taking lisinopril.  Hyperlipidemia: LDL well controlled.  Continue high intensity statin therapy.  Follow-up: Return to clinic in 1 month.  Nelva Bush, MD 06/09/2019 1:14 PM

## 2019-06-09 NOTE — Patient Instructions (Signed)
Medication Instructions:  Your physician has recommended you make the following change in your medication:  1- INCREASE Imdur to 60 mg by mouth two times a day.  *If you need a refill on your cardiac medications before your next appointment, please call your pharmacy*  Lab Work: none If you have labs (blood work) drawn today and your tests are completely normal, you will receive your results only by: Marland Kitchen MyChart Message (if you have MyChart) OR . A paper copy in the mail If you have any lab test that is abnormal or we need to change your treatment, we will call you to review the results.  Testing/Procedures: none  Follow-Up: At [facility], you and your health needs are our priority.  As part of our continuing mission to provide you with exceptional heart care, we have created designated Provider Care Teams.  These Care Teams include your primary Cardiologist (physician) and Advanced Practice Providers (APPs -  Physician Assistants and Nurse Practitioners) who all work together to provide you with the care you need, when you need it.  Your next appointment:   1 month.  The format for your next appointment:   In Person  Provider:    You may see Nelva Bush, MD or one of the following Advanced Practice Providers on your designated Care Team:    Murray Hodgkins, NP  Christell Shayda, PA-C  Marrianne Mood, PA-C   Other Instructions  If you blood pressure drops to around 90/60 or you experience lightheadedness, you may stop your lisinopril.

## 2019-06-19 ENCOUNTER — Other Ambulatory Visit: Payer: Self-pay

## 2019-06-19 ENCOUNTER — Encounter (HOSPITAL_COMMUNITY): Payer: Self-pay | Admitting: Internal Medicine

## 2019-06-19 ENCOUNTER — Observation Stay (HOSPITAL_COMMUNITY)
Admission: EM | Admit: 2019-06-19 | Discharge: 2019-06-20 | Disposition: A | Discharge status: Home / Self Care | Payer: Medicare Other | Attending: Family Medicine | Admitting: Family Medicine

## 2019-06-19 DIAGNOSIS — N183 Chronic kidney disease, stage 3 unspecified: Secondary | ICD-10-CM | POA: Insufficient documentation

## 2019-06-19 DIAGNOSIS — Z7984 Long term (current) use of oral hypoglycemic drugs: Secondary | ICD-10-CM | POA: Insufficient documentation

## 2019-06-19 DIAGNOSIS — I252 Old myocardial infarction: Secondary | ICD-10-CM | POA: Insufficient documentation

## 2019-06-19 DIAGNOSIS — Z882 Allergy status to sulfonamides status: Secondary | ICD-10-CM | POA: Insufficient documentation

## 2019-06-19 DIAGNOSIS — D123 Benign neoplasm of transverse colon: Secondary | ICD-10-CM | POA: Insufficient documentation

## 2019-06-19 DIAGNOSIS — Z853 Personal history of malignant neoplasm of breast: Secondary | ICD-10-CM | POA: Diagnosis not present

## 2019-06-19 DIAGNOSIS — E1122 Type 2 diabetes mellitus with diabetic chronic kidney disease: Secondary | ICD-10-CM | POA: Diagnosis not present

## 2019-06-19 DIAGNOSIS — E119 Type 2 diabetes mellitus without complications: Secondary | ICD-10-CM

## 2019-06-19 DIAGNOSIS — I25118 Atherosclerotic heart disease of native coronary artery with other forms of angina pectoris: Secondary | ICD-10-CM | POA: Diagnosis not present

## 2019-06-19 DIAGNOSIS — J441 Chronic obstructive pulmonary disease with (acute) exacerbation: Secondary | ICD-10-CM | POA: Diagnosis present

## 2019-06-19 DIAGNOSIS — K760 Fatty (change of) liver, not elsewhere classified: Secondary | ICD-10-CM | POA: Diagnosis not present

## 2019-06-19 DIAGNOSIS — G4733 Obstructive sleep apnea (adult) (pediatric): Secondary | ICD-10-CM | POA: Diagnosis not present

## 2019-06-19 DIAGNOSIS — E785 Hyperlipidemia, unspecified: Secondary | ICD-10-CM | POA: Insufficient documentation

## 2019-06-19 DIAGNOSIS — Z7982 Long term (current) use of aspirin: Secondary | ICD-10-CM | POA: Diagnosis not present

## 2019-06-19 DIAGNOSIS — K5730 Diverticulosis of large intestine without perforation or abscess without bleeding: Secondary | ICD-10-CM | POA: Diagnosis not present

## 2019-06-19 DIAGNOSIS — Z6841 Body Mass Index (BMI) 40.0 and over, adult: Secondary | ICD-10-CM | POA: Insufficient documentation

## 2019-06-19 DIAGNOSIS — Z7951 Long term (current) use of inhaled steroids: Secondary | ICD-10-CM | POA: Insufficient documentation

## 2019-06-19 DIAGNOSIS — Z20828 Contact with and (suspected) exposure to other viral communicable diseases: Secondary | ICD-10-CM | POA: Diagnosis not present

## 2019-06-19 DIAGNOSIS — Z170 Estrogen receptor positive status [ER+]: Secondary | ICD-10-CM

## 2019-06-19 DIAGNOSIS — I10 Essential (primary) hypertension: Secondary | ICD-10-CM | POA: Diagnosis present

## 2019-06-19 DIAGNOSIS — K625 Hemorrhage of anus and rectum: Secondary | ICD-10-CM

## 2019-06-19 DIAGNOSIS — Z79899 Other long term (current) drug therapy: Secondary | ICD-10-CM | POA: Insufficient documentation

## 2019-06-19 DIAGNOSIS — M8580 Other specified disorders of bone density and structure, unspecified site: Secondary | ICD-10-CM | POA: Insufficient documentation

## 2019-06-19 DIAGNOSIS — I130 Hypertensive heart and chronic kidney disease with heart failure and stage 1 through stage 4 chronic kidney disease, or unspecified chronic kidney disease: Secondary | ICD-10-CM | POA: Insufficient documentation

## 2019-06-19 DIAGNOSIS — Z880 Allergy status to penicillin: Secondary | ICD-10-CM | POA: Insufficient documentation

## 2019-06-19 DIAGNOSIS — K922 Gastrointestinal hemorrhage, unspecified: Secondary | ICD-10-CM

## 2019-06-19 DIAGNOSIS — E1165 Type 2 diabetes mellitus with hyperglycemia: Secondary | ICD-10-CM

## 2019-06-19 DIAGNOSIS — K921 Melena: Principal | ICD-10-CM | POA: Insufficient documentation

## 2019-06-19 DIAGNOSIS — I350 Nonrheumatic aortic (valve) stenosis: Secondary | ICD-10-CM | POA: Insufficient documentation

## 2019-06-19 DIAGNOSIS — Z8249 Family history of ischemic heart disease and other diseases of the circulatory system: Secondary | ICD-10-CM | POA: Insufficient documentation

## 2019-06-19 DIAGNOSIS — I5032 Chronic diastolic (congestive) heart failure: Secondary | ICD-10-CM | POA: Diagnosis not present

## 2019-06-19 DIAGNOSIS — Z87891 Personal history of nicotine dependence: Secondary | ICD-10-CM | POA: Insufficient documentation

## 2019-06-19 DIAGNOSIS — Z885 Allergy status to narcotic agent status: Secondary | ICD-10-CM | POA: Insufficient documentation

## 2019-06-19 DIAGNOSIS — Z951 Presence of aortocoronary bypass graft: Secondary | ICD-10-CM | POA: Diagnosis not present

## 2019-06-19 DIAGNOSIS — C50812 Malignant neoplasm of overlapping sites of left female breast: Secondary | ICD-10-CM

## 2019-06-19 DIAGNOSIS — J449 Chronic obstructive pulmonary disease, unspecified: Secondary | ICD-10-CM

## 2019-06-19 DIAGNOSIS — Z803 Family history of malignant neoplasm of breast: Secondary | ICD-10-CM | POA: Insufficient documentation

## 2019-06-19 DIAGNOSIS — D62 Acute posthemorrhagic anemia: Secondary | ICD-10-CM | POA: Diagnosis present

## 2019-06-19 DIAGNOSIS — I5033 Acute on chronic diastolic (congestive) heart failure: Secondary | ICD-10-CM | POA: Diagnosis present

## 2019-06-19 DIAGNOSIS — Z881 Allergy status to other antibiotic agents status: Secondary | ICD-10-CM | POA: Insufficient documentation

## 2019-06-19 DIAGNOSIS — R55 Syncope and collapse: Secondary | ICD-10-CM

## 2019-06-19 LAB — GLUCOSE, CAPILLARY
Glucose-Capillary: 121 mg/dL — ABNORMAL HIGH (ref 70–99)
Glucose-Capillary: 94 mg/dL (ref 70–99)
Glucose-Capillary: 161 mg/dL — ABNORMAL HIGH (ref 70–99)
Glucose-Capillary: 105 mg/dL — ABNORMAL HIGH (ref 70–99)
Glucose-Capillary: 125 mg/dL — ABNORMAL HIGH (ref 70–99)

## 2019-06-19 LAB — CBC
HCT: 33.9 % — ABNORMAL LOW (ref 36.0–46.0)
HCT: 32.2 % — ABNORMAL LOW (ref 36.0–46.0)
HCT: 30 % — ABNORMAL LOW (ref 36.0–46.0)
HCT: 29.5 % — ABNORMAL LOW (ref 36.0–46.0)
Hemoglobin: 10.8 g/dL — ABNORMAL LOW (ref 12.0–15.0)
Hemoglobin: 9.8 g/dL — ABNORMAL LOW (ref 12.0–15.0)
Hemoglobin: 9.6 g/dL — ABNORMAL LOW (ref 12.0–15.0)
Hemoglobin: 9.4 g/dL — ABNORMAL LOW (ref 12.0–15.0)
MCH: 29.2 pg (ref 26.0–34.0)
MCH: 28.2 pg (ref 26.0–34.0)
MCH: 29.3 pg (ref 26.0–34.0)
MCH: 29.4 pg (ref 26.0–34.0)
MCHC: 31.9 g/dL (ref 30.0–36.0)
MCHC: 30.4 g/dL (ref 30.0–36.0)
MCHC: 32 g/dL (ref 30.0–36.0)
MCHC: 31.9 g/dL (ref 30.0–36.0)
MCV: 91.6 fL (ref 80.0–100.0)
MCV: 92.8 fL (ref 80.0–100.0)
MCV: 91.5 fL (ref 80.0–100.0)
MCV: 92.2 fL (ref 80.0–100.0)
Platelets: 221 10*3/uL (ref 150–400)
Platelets: 164 10*3/uL (ref 150–400)
Platelets: 135 10*3/uL — ABNORMAL LOW (ref 150–400)
Platelets: 145 10*3/uL — ABNORMAL LOW (ref 150–400)
RBC: 3.7 MIL/uL — ABNORMAL LOW (ref 3.87–5.11)
RBC: 3.47 MIL/uL — ABNORMAL LOW (ref 3.87–5.11)
RBC: 3.28 MIL/uL — ABNORMAL LOW (ref 3.87–5.11)
RBC: 3.2 MIL/uL — ABNORMAL LOW (ref 3.87–5.11)
RDW: 13.7 % (ref 11.5–15.5)
RDW: 13.6 % (ref 11.5–15.5)
RDW: 13.6 % (ref 11.5–15.5)
RDW: 13.7 % (ref 11.5–15.5)
WBC: 8.8 10*3/uL (ref 4.0–10.5)
WBC: 8.9 10*3/uL (ref 4.0–10.5)
WBC: 7.2 10*3/uL (ref 4.0–10.5)
WBC: 5.5 10*3/uL (ref 4.0–10.5)
nRBC: 0 % (ref 0.0–0.2)
nRBC: 0 % (ref 0.0–0.2)
nRBC: 0 % (ref 0.0–0.2)
nRBC: 0 % (ref 0.0–0.2)

## 2019-06-19 LAB — COMPREHENSIVE METABOLIC PANEL
ALT: 25 U/L (ref 0–44)
AST: 24 U/L (ref 15–41)
Albumin: 3.3 g/dL — ABNORMAL LOW (ref 3.5–5.0)
Alkaline Phosphatase: 64 U/L (ref 38–126)
Anion gap: 12 (ref 5–15)
BUN: 23 mg/dL (ref 8–23)
CO2: 21 mmol/L — ABNORMAL LOW (ref 22–32)
Calcium: 8.8 mg/dL — ABNORMAL LOW (ref 8.9–10.3)
Chloride: 106 mmol/L (ref 98–111)
Creatinine, Ser: 1.26 mg/dL — ABNORMAL HIGH (ref 0.44–1.00)
GFR calc Af Amer: 49 mL/min — ABNORMAL LOW (ref >60)
GFR calc non Af Amer: 42 mL/min — ABNORMAL LOW (ref >60)
Glucose, Bld: 149 mg/dL — ABNORMAL HIGH (ref 70–99)
Potassium: 4 mmol/L (ref 3.5–5.1)
Sodium: 139 mmol/L (ref 135–145)
Total Bilirubin: 0.4 mg/dL (ref 0.3–1.2)
Total Protein: 6.1 g/dL — ABNORMAL LOW (ref 6.5–8.1)

## 2019-06-19 LAB — MRSA PCR SCREENING: MRSA by PCR: POSITIVE — AB

## 2019-06-19 LAB — LACTIC ACID, PLASMA
Lactic Acid, Venous: 1.8 mmol/L (ref 0.5–1.9)
Lactic Acid, Venous: 1.7 mmol/L (ref 0.5–1.9)

## 2019-06-19 LAB — POC OCCULT BLOOD, ED: Fecal Occult Bld: POSITIVE — AB

## 2019-06-19 LAB — CBG MONITORING, ED: Glucose-Capillary: 132 mg/dL — ABNORMAL HIGH (ref 70–99)

## 2019-06-19 LAB — SARS CORONAVIRUS 2 (TAT 6-24 HRS): SARS Coronavirus 2: NEGATIVE

## 2019-06-19 LAB — TYPE AND SCREEN
ABO/RH(D): O NEG
Antibody Screen: NEGATIVE

## 2019-06-19 MED ORDER — PEG 3350-KCL-NA BICARB-NACL 420 G PO SOLR
4000.0000 mL | Freq: Once | ORAL | Status: AC
  Administered 2019-06-19: 18:00:00 4000 mL via ORAL
  Filled 2019-06-19 (×2): qty 4000

## 2019-06-19 MED ORDER — MUPIROCIN 2 % EX OINT
TOPICAL_OINTMENT | Freq: Two times a day (BID) | CUTANEOUS | Status: DC
  Administered 2019-06-19 (×2): via NASAL
  Filled 2019-06-19: qty 22

## 2019-06-19 MED ORDER — SODIUM CHLORIDE 0.9 % IV BOLUS
1000.0000 mL | Freq: Once | INTRAVENOUS | Status: AC
  Administered 2019-06-19: 02:00:00 1000 mL via INTRAVENOUS

## 2019-06-19 MED ORDER — INSULIN ASPART 100 UNIT/ML ~~LOC~~ SOLN
0.0000 [IU] | SUBCUTANEOUS | Status: DC
  Administered 2019-06-19: 05:00:00 1 [IU] via SUBCUTANEOUS
  Administered 2019-06-19: 12:00:00 2 [IU] via SUBCUTANEOUS
  Administered 2019-06-19: 1 [IU] via SUBCUTANEOUS

## 2019-06-19 MED ORDER — ACETAMINOPHEN 325 MG PO TABS: 650.0000 mg | ORAL_TABLET | Freq: Four times a day (QID) | ORAL | Status: DC | PRN

## 2019-06-19 MED ORDER — ACETAMINOPHEN 650 MG RE SUPP: 650.0000 mg | Freq: Four times a day (QID) | RECTAL | Status: DC | PRN

## 2019-06-19 MED ORDER — ONDANSETRON HCL 4 MG PO TABS: 4.0000 mg | ORAL_TABLET | Freq: Four times a day (QID) | ORAL | Status: DC | PRN

## 2019-06-19 MED ORDER — METOPROLOL TARTRATE 25 MG PO TABS
25.0000 mg | ORAL_TABLET | Freq: Two times a day (BID) | ORAL | Status: DC
  Administered 2019-06-19 (×2): 25 mg via ORAL
  Filled 2019-06-19 (×2): qty 1

## 2019-06-19 MED ORDER — ATORVASTATIN CALCIUM 80 MG PO TABS
80.0000 mg | ORAL_TABLET | Freq: Every day | ORAL | Status: DC
  Administered 2019-06-19: 80 mg via ORAL
  Filled 2019-06-19: qty 1

## 2019-06-19 MED ORDER — PANTOPRAZOLE SODIUM 40 MG IV SOLR
40.0000 mg | Freq: Two times a day (BID) | INTRAVENOUS | Status: DC
  Administered 2019-06-19 (×2): 40 mg via INTRAVENOUS
  Filled 2019-06-19 (×2): qty 40

## 2019-06-19 MED ORDER — MOMETASONE FURO-FORMOTEROL FUM 200-5 MCG/ACT IN AERO
2.0000 | INHALATION_SPRAY | Freq: Two times a day (BID) | RESPIRATORY_TRACT | Status: DC
  Administered 2019-06-19 – 2019-06-20 (×3): 2 via RESPIRATORY_TRACT
  Filled 2019-06-19: qty 8.8

## 2019-06-19 MED ORDER — SODIUM CHLORIDE 0.9 % IV SOLN: INTRAVENOUS | Status: DC

## 2019-06-19 MED ORDER — SODIUM CHLORIDE 0.9 % IV SOLN
INTRAVENOUS | Status: AC
  Administered 2019-06-19: 05:00:00 via INTRAVENOUS

## 2019-06-19 MED ORDER — ONDANSETRON HCL 4 MG/2ML IJ SOLN: 4.0000 mg | Freq: Four times a day (QID) | INTRAMUSCULAR | Status: DC | PRN

## 2019-06-19 NOTE — ED Notes (Signed)
Admitting doctor just went in to see the pt

## 2019-06-19 NOTE — H&P (Addendum)
History and Physical    Jamie [name] HEN:[id number] DOB: 09-03-1945 DOA: 06/19/2019  PCP: Verdell Carmine., MD  Patient coming from: Home.  Chief Complaint: Rectal bleeding.  HPI: Jamie [name] is a 73 y.o. female with history of CAD status post CABG, COPD, breast cancer in remission, chronic kidney disease stage III, diabetes mellitus type 2 presents to the ER after patient had multiple episodes of frank rectal bleeding since last evening.  Patient states that she had frank rectal bleeding without any abdominal pain but did have some gurgling sound before the episodes.  Denies any nausea vomiting chest pain or shortness of breath.  While waiting in the ER patient almost passed out.  ED Course: In the ER patient's vital signs were stable but EKG showed normal sinus rhythm hemoglobin was around 10 and a drop of about 3 g from January of this year.  Creatinine was 1.2 LFTs were normal potassium 4 9 lactic acid 1.8.  COVID-19 test is pending.  Patient admitted for acute GI bleeding.  Review of Systems: As per HPI, rest all negative.   Past Medical History:  Diagnosis Date  . Anxiety   . Asthma   . Breast cancer ([place]) 08/2011   lt. breast ca  . CAD (coronary artery disease)    NSTEMI 5/13:  LHC demonstrated oLM 75%, pLAD 80%, RCA occluded, filled by collaterals from the LAD.  Echo 12/10/11: EF 60%.; CABG 12/13/11: LIMA-LAD, SVG-RCA, SVG-diagonal and OM.   Marland Kitchen Carotid artery disease ([place])    a.  Pre-CABG Dopplers 5/13: Bilateral 40-59%; b.  Carotid US 3/61: RICA 44-31%, LICA 54-00%, normal subclavians bilaterally >> FU 6 months;  c. Carotid US 86/76: RICA 19-50%; LICA 93-26%, normal subclavians >> FU 1 year // d. Carotid US 11/17:  Stable 40-59% bilateral ICA stenosis; F/U 1 year.  Marland Kitchen COPD (chronic obstructive pulmonary disease) ([place])   . DM2 (diabetes mellitus, type 2) (HCC)    metformin  . Gallstones    Symptomatic  . HLD (hyperlipidemia)   . Hx of cardiovascular stress test    Myoview  5/16:  small area of inf-lat ischemia, EF 70%; Low Risk >> try medical Rx  . Hx of echocardiogram    Echo 5/16:  EF 60-65%, no RWMA, Gr 1 DD, Ao sclerosis, mild AS, mean 14 mmHg, MAC  . Hx of migraines   . Hypertension   . Myocardial infarction ([place])   . Obesity   . Shortness of breath   . Sleep apnea    sleep study2-3 yrs ago lost weight afterward but gained back  . UTI (lower urinary tract infection)    freq-bladder implant -removed    Past Surgical History:  Procedure Laterality Date  . ABDOMINAL HYSTERECTOMY  1977  . APPENDECTOMY    . AXILLARY LYMPH NODE DISSECTION  10/18/2011   Procedure: AXILLARY LYMPH NODE DISSECTION;  Surgeon: Merrie Roof, MD;  Location: [place];  Service: General;  Laterality: Left;  . BREAST RECONSTRUCTION  10/18/2011   Procedure: BREAST RECONSTRUCTION;  Surgeon: Theodoro Kos, DO;  Location: [place];  Service: Plastics;  Laterality: Bilateral;   bilateral breast reconstruction with bilateral tissue expander and placement of flex hd.  Marland Kitchen CARDIAC CATHETERIZATION    . CESAREAN SECTION  04/1976  . CHOLECYSTECTOMY N/A 08/17/2018   Procedure: LAPAROSCOPIC CHOLECYSTECTOMY;  Surgeon: Coralie Keens, MD;  Location: [place];  Service: General;  Laterality: N/A;  . CORONARY ARTERY BYPASS GRAFT  12/13/2011   Procedure: CORONARY  ARTERY BYPASS GRAFTING (CABG);  Surgeon: Gaye Pollack, MD;  Location: [place];  Service: Open Heart Surgery;  Laterality: N/A;  Times four using endoscopically harvested left greater saphenous vein and left internal mammary artery. Right greater saphenous vein attempted; not appropriate for vein harvest.  . EYE SURGERY     bilateral cataract extraction   . INTRAVASCULAR PRESSURE WIRE/FFR STUDY N/A 08/22/2017   Procedure: INTRAVASCULAR PRESSURE WIRE/FFR STUDY;  Surgeon: Nelva Bush, MD;  Location: [place] [facility];  Service: Cardiovascular;  Laterality: N/A;  . LAPAROSCOPIC GASTRIC BANDING  2010  . LEFT HEART CATHETERIZATION WITH CORONARY  ANGIOGRAM N/A 12/11/2011   Procedure: LEFT HEART CATHETERIZATION WITH CORONARY ANGIOGRAM;  Surgeon: Larey Dresser, MD;  Location: [facility];  Service: Cardiovascular;  Laterality: N/A;  . LESION REMOVAL  04/09/2012   Procedure: LESION REMOVAL;  Surgeon: Theodoro Kos, DO;  Location: [employer];  Service: Plastics;  Laterality: Bilateral;  . MASTECTOMY W/ SENTINEL NODE BIOPSY  10/18/2011   Procedure: MASTECTOMY WITH SENTINEL LYMPH NODE BIOPSY;  Surgeon: Merrie Roof, MD;  Location: [place];  Service: General;  Laterality: Bilateral;  bilateral mastectomy and left sentinel node biopsy  . NASAL SINUS SURGERY    . PORT-A-CATH REMOVAL  04/09/2012   Procedure: REMOVAL PORT-A-CATH;  Surgeon: Merrie Roof, MD;  Location: [place];  Service: General;  Laterality: Right;  . PORTACATH PLACEMENT  12/02/2011   Procedure: INSERTION PORT-A-CATH;  Surgeon: Merrie Roof, MD;  Location: [place];  Service: General;  Laterality: Right;  . RIGHT/LEFT HEART CATH AND CORONARY/GRAFT ANGIOGRAPHY N/A 08/22/2017   Procedure: RIGHT/LEFT HEART CATH AND CORONARY/GRAFT ANGIOGRAPHY;  Surgeon: Nelva Bush, MD;  Location: [place] [facility];  Service: Cardiovascular;  Laterality: N/A;  . TONSILLECTOMY    . US ECHOCARDIOGRAPHY     at [place] NAVIGATION Left 11/26/2013   Procedure: VIDEO BRONCHOSCOPY WITH ENDOBRONCHIAL NAVIGATION;  Surgeon: Collene Gobble, MD;  Location: [place];  Service: Thoracic;  Laterality: Left;     reports that she quit smoking about 12 years ago. Her smoking use included cigarettes. She has a 80.00 pack-year smoking history. She has never used smokeless tobacco. She reports current alcohol use. She reports that she does not use drugs.  Allergies  Allergen Reactions  . Penicillins Nausea Only and Other (See Comments)    DID THE REACTION INVOLVE: Swelling of the face/tongue/throat, SOB, or low BP? No Sudden or severe  rash/hives, skin peeling, or the inside of the mouth or nose? No Did it require medical treatment? #  #  #  YES  #  #  #  When did it last happen? Unknown   . Vicodin [Hydrocodone-Acetaminophen] Other (See Comments)    Syncope   . Sulfa Antibiotics Rash  . Sulfamethoxazole Rash    Family History  Problem Relation Age of Onset  . Heart disease Mother   . Heart disease Father   . Breast cancer Maternal Aunt   . Ovarian cancer Paternal Aunt   . Breast cancer Maternal Aunt   . Anesthesia problems Neg Hx   . Hypotension Neg Hx   . Malignant hyperthermia Neg Hx   . Pseudochol deficiency Neg Hx     Prior to Admission medications   Medication Sig Start Date End Date Taking? Authorizing Provider  albuterol (PROVENTIL) (2.5 MG/3ML) 0.083% nebulizer solution Inhale 3 mLs into the lungs every 6 (six) hours  as needed for wheezing or shortness of breath. 02/19/18  Yes [provider]  aspirin EC 81 MG tablet Take 1 tablet (81 mg total) by mouth daily. 04/26/16  Yes Weaver, Scott T, PA-C  atorvastatin (LIPITOR) 80 MG tablet TAKE 1 TABLET BY MOUTH ONCE DAILY AT  6PM Patient taking differently: Take 80 mg by mouth daily at 6 PM.  11/03/18  Yes End, Harrell Gave, MD  buPROPion (WELLBUTRIN XL) 300 MG 24 hr tablet Take 300 mg by mouth daily.  06/20/17  Yes [provider]  cephALEXin (KEFLEX) 500 MG capsule Take 500 mg by mouth daily.  03/05/17  Yes [provider]  Cholecalciferol (VITAMIN D3) 1000 units CAPS Take 1,000 Units by mouth daily.    Yes [provider]  fluticasone (FLONASE) 50 MCG/ACT nasal spray Place 2 sprays into both nostrils daily.  08/14/13  Yes [provider]  Fluticasone-Salmeterol (ADVAIR) 250-50 MCG/DOSE AEPB Inhale 1 puff into the lungs 2 (two) times daily.  04/30/19  Yes [provider]  furosemide (LASIX) 20 MG tablet Take 20 mg by mouth 2 (two) times daily. 06/07/19  Yes [provider]  Glycopyrrolate-Formoterol  (BEVESPI AEROSPHERE) 9-4.8 MCG/ACT AERO Inhale 2 puffs into the lungs daily.   Yes [provider]  isosorbide mononitrate (IMDUR) 60 MG 24 hr tablet Take 1 tablet (60 mg total) by mouth 2 (two) times daily. 06/09/19 09/07/19 Yes End, Harrell Gave, MD  lisinopril (ZESTRIL) 5 MG tablet Take 1 tablet (5 mg total) by mouth daily. 05/05/19 08/03/19 Yes End, Harrell Gave, MD  loratadine (CLARITIN) 10 MG tablet Take 10 mg by mouth daily.   Yes [provider]  metFORMIN (GLUCOPHAGE) 500 MG tablet Take 1 tablet (500 mg total) by mouth 2 (two) times daily. Patient taking differently: Take 500 mg by mouth every evening.  08/25/17  Yes End, Harrell Gave, MD  metoprolol tartrate (LOPRESSOR) 25 MG tablet Take 1 tablet (25 mg total) by mouth 2 (two) times daily. 01/08/19  Yes End, Harrell Gave, MD  Multiple Vitamin (MULTI VITAMIN DAILY PO) Take 1 tablet by mouth daily.   Yes [provider]  nitroGLYCERIN (NITROSTAT) 0.4 MG SL tablet Place 1 tablet (0.4 mg total) under the tongue every 5 (five) minutes as needed. 06/22/18  Yes End, Harrell Gave, MD  Polyethyl Glycol-Propyl Glycol (SYSTANE) 0.4-0.3 % SOLN Place 1 drop into both eyes 4 (four) times daily as needed (for dry eyes).    Yes [provider]  furosemide (LASIX) 40 MG tablet Take 0.5 tablets (20 mg total) by mouth daily. Patient not taking: Reported on 06/19/2019 06/07/19 06/01/20  End, Harrell Gave, MD    Physical Exam: Constitutional: Moderately built and nourished. Vitals:   06/19/19 0054 06/19/19 0057 06/19/19 0130  BP: (!) 115/45  (!) 114/43  Pulse: 67  62  Resp: (!) 26  15  Temp: 97.8 F (36.6 C)    TempSrc: Oral    SpO2: 97%  96%  Weight:  117.9 kg   Height:  _0  (1.651 m)    Eyes: Nonicteric no pallor. ENMT: No discharge from the ears eyes nose or mouth. Neck: No mass felt.  No neck rigidity. Respiratory: No rhonchi or crepitations. Cardiovascular: S1-S2 heard. Abdomen: Soft nontender bowel sounds present.  Musculoskeletal: No edema.  No joint effusion. Skin: No rash. Neurologic: Alert awake oriented to time place and person.  Moves all extremities. Psychiatric: Appears normal per normal affect.   Labs on Admission: I have personally reviewed following labs and imaging studies  CBC: Recent Labs  Lab 06/19/19 0103  WBC 8.8  HGB 10.8*  HCT 33.9*  MCV 91.6  PLT 935   Basic Metabolic Panel: Recent Labs  Lab 06/19/19 0103  NA 139  K 4.0  CL 106  CO2 21*  GLUCOSE 149*  BUN 23  CREATININE 1.26*  CALCIUM 8.8*   GFR: Estimated Creatinine Clearance: 50.3 mL/min (A) (by C-G formula based on SCr of 1.26 mg/dL (H)). Liver Function Tests: Recent Labs  Lab 06/19/19 0103  AST 24  ALT 25  ALKPHOS 64  BILITOT 0.4  PROT 6.1*  ALBUMIN 3.3*   No results for input(s): LIPASE, AMYLASE in the last 168 hours. No results for input(s): AMMONIA in the last 168 hours. Coagulation Profile: No results for input(s): INR, PROTIME in the last 168 hours. Cardiac Enzymes: No results for input(s): CKTOTAL, CKMB, CKMBINDEX, TROPONINI in the last 168 hours. BNP (last 3 results) No results for input(s): PROBNP in the last 8760 hours. HbA1C: No results for input(s): HGBA1C in the last 72 hours. CBG: No results for input(s): GLUCAP in the last 168 hours. Lipid Profile: No results for input(s): CHOL, HDL, LDLCALC, TRIG, CHOLHDL, LDLDIRECT in the last 72 hours. Thyroid Function Tests: No results for input(s): TSH, T4TOTAL, FREET4, T3FREE, THYROIDAB in the last 72 hours. Anemia Panel: No results for input(s): VITAMINB12, FOLATE, FERRITIN, TIBC, IRON, RETICCTPCT in the last 72 hours. Urine analysis:    Component Value Date/Time   COLORURINE YELLOW 05/03/2018 [place] 05/03/2018 1121   LABSPEC 1.019 05/03/2018 1121   PHURINE 5.0 05/03/2018 1121   GLUCOSEU NEGATIVE 05/03/2018 1121   HGBUR NEGATIVE 05/03/2018 1121   BILIRUBINUR NEGATIVE 05/03/2018 1121   KETONESUR NEGATIVE  05/03/2018 1121   PROTEINUR NEGATIVE 05/03/2018 1121   UROBILINOGEN 0.2 12/12/2011 1832   NITRITE NEGATIVE 05/03/2018 1121   LEUKOCYTESUR NEGATIVE 05/03/2018 1121   Sepsis Labs: _0 (procalcitonin:4,lacticidven:4) )No results found for this or any previous visit (from the past 240 hour(s)).   Radiological Exams on Admission: No results found.  EKG: Independently reviewed.  Normal sinus rhythm.  Assessment/Plan Principal Problem:   Acute GI bleeding Active Problems:   COPD (chronic obstructive pulmonary disease) (HCC)   Coronary artery disease of native heart with stable angina pectoris (HCC)   OSA (obstructive sleep apnea)   Diabetes mellitus, type 2 (HCC)   Malignant neoplasm of overlapping sites of left breast in female, estrogen receptor positive ([place])   Chronic diastolic heart failure (HCC)   Essential hypertension   Acute blood loss anemia    1. Acute GI bleeding -painless rectal bleeding likely lower GI.  Patient states he has had a colonoscopy multiple times at [facility] records of which of are not accessible.  Will hold patient's aspirin.  Patient denies taking any NSAIDs.  Protonix.  Check serial CBCs and IV fluids and consult gastroenterologist in the morning earlier if patient becomes hypotensive or has further bleeding. 2. Acute blood loss anemia follow CBC.  Hold aspirin. 3. History of CAD denies any chest pain.  Patient's blood pressure remained stable will continue patient's beta-blocker.  On statins. 4. Hypertension we will hold lisinopril due to having syncopal-like episode.  Continue beta-blockers if blood pressure stable. 5. Diastolic dysfunction hold Lasix and presently on IV fluids. 6. Possible syncope likely from GI bleed.  Follow blood pressure trends closely. 7. Sleep apnea mention in the chart but patient states he does not use CPAP. 8. COPD not wheezing.  On inhalers. 9. Diabetes  mellitus type 2 on sliding scale coverage. 10. Chronic kidney  disease stage III creatinine appears to be at baseline. 11. Breast cancer in remission.   DVT prophylaxis: SCDs due to GI bleed. Code Status: Full code. Family Communication: Discussed with patient. Disposition Plan: Home. Consults called: None. Admission status: Observation.   Rise Patience MD [facility] Pager [phone].  If 7PM-7AM, please contact night-coverage [email or web address] Password [email or web address]  06/19/2019, 2:41 AM

## 2019-06-19 NOTE — Progress Notes (Signed)
Subjective: Interval History: No new bleeding. Feels tired and fatigue. No chest pain or Sob or palpitation.   Objective: Vital signs in last 24 hours: Temp:  [97.6 F (36.4 C)-98.3 F (36.8 C)] 97.6 F (36.4 C) (11/14 1147) Pulse Rate:  [62-87] 73 (11/14 1147) Resp:  [14-26] 18 (11/14 1147) BP: (114-147)/(43-72) 141/64 (11/14 1147) SpO2:  [96 %-100 %] 100 % (11/14 1210) Weight:  [114 kg-117.9 kg] 114 kg (11/14 0447)  Intake/Output from previous day: 11/13 0701 - 11/14 0700 In: 1593.7 [I.V.:1077.6] Out: -  Intake/Output this shift: Total I/O In: 360 [P.O.:360] Out: -   Eyes: Nonicteric no pallor. ENMT: No discharge from the ears eyes nose or mouth. Neck: No mass felt.  No neck rigidity. Respiratory: No rhonchi or crepitations. Cardiovascular: S1-S2 heard. Abdomen: Soft nontender bowel sounds present. Musculoskeletal: No edema.  No joint effusion. Skin: No rash. Neurologic: Alert awake oriented to time place and person.  Moves all extremities. Psychiatric: Appears normal per normal affect.  Results for orders placed or performed during the hospital encounter of 06/19/19 (from the past 24 hour(s))  Comprehensive metabolic panel     Status: Abnormal   Collection Time: 06/19/19  1:03 AM  Result Value Ref Range   Sodium 139 135 - 145 mmol/L   Potassium 4.0 3.5 - 5.1 mmol/L   Chloride 106 98 - 111 mmol/L   CO2 21 (L) 22 - 32 mmol/L   Glucose, Bld 149 (H) 70 - 99 mg/dL   BUN 23 8 - 23 mg/dL   Creatinine, Ser 1.26 (H) 0.44 - 1.00 mg/dL   Calcium 8.8 (L) 8.9 - 10.3 mg/dL   Total Protein 6.1 (L) 6.5 - 8.1 g/dL   Albumin 3.3 (L) 3.5 - 5.0 g/dL   AST 24 15 - 41 U/L   ALT 25 0 - 44 U/L   Alkaline Phosphatase 64 38 - 126 U/L   Total Bilirubin 0.4 0.3 - 1.2 mg/dL   GFR calc non Af Amer 42 (L) >60 mL/min   GFR calc Af Amer 49 (L) >60 mL/min   Anion gap 12 5 - 15  CBC     Status: Abnormal   Collection Time: 06/19/19  1:03 AM  Result Value Ref Range   WBC 8.8 4.0 - 10.5 K/uL    RBC 3.70 (L) 3.87 - 5.11 MIL/uL   Hemoglobin 10.8 (L) 12.0 - 15.0 g/dL   HCT 33.9 (L) 36.0 - 46.0 %   MCV 91.6 80.0 - 100.0 fL   MCH 29.2 26.0 - 34.0 pg   MCHC 31.9 30.0 - 36.0 g/dL   RDW 13.7 11.5 - 15.5 %   Platelets 221 150 - 400 K/uL   nRBC 0.0 0.0 - 0.2 %  Type and screen [place]     Status: None   Collection Time: 06/19/19  1:04 AM  Result Value Ref Range   ABO/RH(D) O NEG    Antibody Screen NEG    Sample Expiration      06/22/2019,2359 Performed at [facility], [address] [place], [place] [address]   POC occult blood, ED     Status: Abnormal   Collection Time: 06/19/19  1:18 AM  Result Value Ref Range   Fecal Occult Bld POSITIVE (A) NEGATIVE  SARS CORONAVIRUS 2 (TAT 6-24 HRS) Nasopharyngeal Nasopharyngeal Swab     Status: None   Collection Time: 06/19/19  1:20 AM   Specimen: Nasopharyngeal Swab  Result Value Ref Range   SARS  Coronavirus 2 NEGATIVE NEGATIVE  Lactic acid, plasma     Status: None   Collection Time: 06/19/19  3:05 AM  Result Value Ref Range   Lactic Acid, Venous 1.8 0.5 - 1.9 mmol/L  CBG monitoring, ED     Status: Abnormal   Collection Time: 06/19/19  3:33 AM  Result Value Ref Range   Glucose-Capillary 132 (H) 70 - 99 mg/dL  MRSA PCR Screening     Status: Abnormal   Collection Time: 06/19/19  4:25 AM   Specimen: Nasal Mucosa; Nasopharyngeal  Result Value Ref Range   MRSA by PCR POSITIVE (A) NEGATIVE  Lactic acid, plasma     Status: None   Collection Time: 06/19/19  5:14 AM  Result Value Ref Range   Lactic Acid, Venous 1.7 0.5 - 1.9 mmol/L  CBC     Status: Abnormal   Collection Time: 06/19/19  5:14 AM  Result Value Ref Range   WBC 8.9 4.0 - 10.5 K/uL   RBC 3.47 (L) 3.87 - 5.11 MIL/uL   Hemoglobin 9.8 (L) 12.0 - 15.0 g/dL   HCT 32.2 (L) 36.0 - 46.0 %   MCV 92.8 80.0 - 100.0 fL   MCH 28.2 26.0 - 34.0 pg   MCHC 30.4 30.0 - 36.0 g/dL   RDW 13.6 11.5 - 15.5 %   Platelets 164 150 - 400 K/uL   nRBC 0.0 0.0 - 0.2 %   Glucose, capillary     Status: Abnormal   Collection Time: 06/19/19  5:16 AM  Result Value Ref Range   Glucose-Capillary 121 (H) 70 - 99 mg/dL  Glucose, capillary     Status: None   Collection Time: 06/19/19  8:17 AM  Result Value Ref Range   Glucose-Capillary 94 70 - 99 mg/dL   Comment 1 Notify RN    Comment 2 Document in Chart   CBC     Status: Abnormal   Collection Time: 06/19/19  9:11 AM  Result Value Ref Range   WBC 7.2 4.0 - 10.5 K/uL   RBC 3.28 (L) 3.87 - 5.11 MIL/uL   Hemoglobin 9.6 (L) 12.0 - 15.0 g/dL   HCT 30.0 (L) 36.0 - 46.0 %   MCV 91.5 80.0 - 100.0 fL   MCH 29.3 26.0 - 34.0 pg   MCHC 32.0 30.0 - 36.0 g/dL   RDW 13.6 11.5 - 15.5 %   Platelets 135 (L) 150 - 400 K/uL   nRBC 0.0 0.0 - 0.2 %  Glucose, capillary     Status: Abnormal   Collection Time: 06/19/19 11:43 AM  Result Value Ref Range   Glucose-Capillary 161 (H) 70 - 99 mg/dL   Comment 1 Notify RN    Comment 2 Document in Chart     Studies/Results: No results found.  Scheduled Meds: . atorvastatin  80 mg Oral q1800  . insulin aspart  0-9 Units Subcutaneous Q4H  . metoprolol tartrate  25 mg Oral BID  . mometasone-formoterol  2 puff Inhalation BID  . mupirocin ointment   Nasal BID  . pantoprazole (PROTONIX) IV  40 mg Intravenous Q12H  . polyethylene glycol-electrolytes  4,000 mL Oral Once   Continuous Infusions: . sodium chloride 75 mL/hr at 06/19/19 0447  . sodium chloride     PRN Meds:acetaminophen **OR** acetaminophen, ondansetron **OR** ondansetron (ZOFRAN) IV  Assessment/Plan: Principal Problem:   Acute GI bleeding Active Problems:   COPD (chronic obstructive pulmonary disease) (HCC)   Coronary artery disease of native heart with stable angina pectoris (  HCC)   OSA (obstructive sleep apnea)   Diabetes mellitus, type 2 (HCC)   Malignant neoplasm of overlapping sites of left breast in female, estrogen receptor positive ([place])   Chronic diastolic heart failure (HCC)   Essential  hypertension   Acute blood loss anemia   1. Acute GI bleeding -painless rectal bleeding likely lower GI.  Patient states he has had a colonoscopy multiple times at [facility] records of which of are not accessible.  Will hold patient's aspirin.  Patient denies taking any NSAIDs.  Protonix.  Check serial CBCs and IV fluids and consult gastroenterologist in the morning earlier if patient becomes hypotensive or has further bleeding. - Called GI who saw the Pt this AM - Will follow GI recs; appreciate it   2. Acute blood loss anemia follow CBC.  Hold aspirin.  3. History of CAD denies any chest pain.  Patient's blood pressure remained stable will continue patient's beta-blocker.  On statins.  4. Hypertension we will hold lisinopril due to having syncopal-like episode.  Continue beta-blockers if blood pressure stable.  5. Diastolic dysfunction hold Lasix and presently on IV fluids. - monitor   6. Possible syncope likely from GI bleed.  Follow blood pressure trends closely.  7. Sleep apnea mention in the chart but patient states he does not use CPAP.  8. COPD not wheezing.  On inhalers.  9. Diabetes mellitus type 2 on sliding scale coverage.  10. Chronic kidney disease stage III creatinine appears to be at baseline.  11. Breast cancer in remission.   Chart Reviewed and will cont current Plan and GI recs    LOS: 0 days   Sheanna Dail Izetta Dakin

## 2019-06-19 NOTE — ED Provider Notes (Signed)
[facility] EMERGENCY DEPARTMENT Provider Note   CSN: [id number] Arrival date & time: 06/19/19  0040    History   Chief Complaint Chief Complaint  Patient presents with  . GI Bleeding    HPI Jamie [name] is a 73 y.o. female.   The history is provided by the patient.  She has history diabetes, hyperlipidemia, hypertension, coronary artery disease and comes in because of rectal bleeding.  She states that she has had approximately 6 bloody bowel movements since this afternoon and has passed some clots.  There has not been any abdominal pain but there has been some nausea without vomiting.  She complains of weakness and lightheadedness.  She is on low-dose aspirin but not on any systemic anticoagulants.  She denies prior history of rectal bleeding but does admit to history of diverticulosis.  When she arrived at triage, she had a syncopal episode.  Past Medical History:  Diagnosis Date  . Anxiety   . Asthma   . Breast cancer ([place]) 08/2011   lt. breast ca  . CAD (coronary artery disease)    NSTEMI 5/13:  LHC demonstrated oLM 75%, pLAD 80%, RCA occluded, filled by collaterals from the LAD.  Echo 12/10/11: EF 60%.; CABG 12/13/11: LIMA-LAD, SVG-RCA, SVG-diagonal and OM.   Marland Kitchen Carotid artery disease ([place])    a.  Pre-CABG Dopplers 5/13: Bilateral 40-59%; b.  Carotid US 7/86: RICA 76-72%, LICA 09-47%, normal subclavians bilaterally >> FU 6 months;  c. Carotid US 09/62: RICA 83-66%; LICA 29-47%, normal subclavians >> FU 1 year // d. Carotid US 11/17:  Stable 40-59% bilateral ICA stenosis; F/U 1 year.  Marland Kitchen COPD (chronic obstructive pulmonary disease) ([place])   . DM2 (diabetes mellitus, type 2) ([facility])    metformin  . Gallstones    Symptomatic  . HLD (hyperlipidemia)   . Hx of cardiovascular stress test    Myoview 5/16:  small area of inf-lat ischemia, EF 70%; Low Risk >> try medical Rx  . Hx of echocardiogram    Echo 5/16:  EF 60-65%, no RWMA, Gr 1 DD, Ao sclerosis, mild AS, mean  14 mmHg, MAC  . Hx of migraines   . Hypertension   . Myocardial infarction ([place])   . Obesity   . Shortness of breath   . Sleep apnea    sleep study2-3 yrs ago lost weight afterward but gained back  . UTI (lower urinary tract infection)    freq-bladder implant -removed    Patient Active Problem List   Diagnosis Date Noted  . Symptomatic cholelithiasis 06/22/2018  . Shortness of breath 07/18/2017  . Chronic diastolic heart failure ([place]) 07/18/2017  . Essential hypertension 07/18/2017  . 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 Osteopenia 02/20/2015  . Aortic valve stenosis 01/26/2015  . Fatty liver disease, nonalcoholic 65/46/5035  . Nodule of left lung 11/26/2013  . Diabetes mellitus, type 2 ([place]) 08/16/2013  . OSA (obstructive sleep apnea) 01/28/2013  . Lymphedema of arm 05/07/2012  . Acquired absence of bilateral breasts and nipples 04/10/2012  . Carotid stenosis 03/11/2012  . Preoperative evaluation of a medical condition to rule out surgical contraindications (TAR required) 03/11/2012  . Constipation 12/12/2011  . CHF (congestive heart failure) ([place]) 12/10/2011  . COPD (chronic obstructive pulmonary disease) ([place]) 12/10/2011  . Hyperlipidemia LDL goal <70 12/10/2011  . Obesity 12/10/2011  . Coronary artery disease of native heart with stable angina pectoris ([place]) 12/10/2011  Past Surgical History:  Procedure Laterality Date  . ABDOMINAL HYSTERECTOMY  1977  . APPENDECTOMY    . AXILLARY LYMPH NODE DISSECTION  10/18/2011   Procedure: AXILLARY LYMPH NODE DISSECTION;  Surgeon: Merrie Roof, MD;  Location: [facility];  Service: General;  Laterality: Left;  . BREAST RECONSTRUCTION  10/18/2011   Procedure: BREAST RECONSTRUCTION;  Surgeon: Theodoro Kos, DO;  Location: [facility];  Service: Plastics;  Laterality: Bilateral;   bilateral breast reconstruction with bilateral tissue expander and placement of flex hd.   Marland Kitchen CARDIAC CATHETERIZATION    . CESAREAN SECTION  04/1976  . CHOLECYSTECTOMY N/A 08/17/2018   Procedure: LAPAROSCOPIC CHOLECYSTECTOMY;  Surgeon: Coralie Keens, MD;  Location: [facility];  Service: General;  Laterality: N/A;  . CORONARY ARTERY BYPASS GRAFT  12/13/2011   Procedure: CORONARY ARTERY BYPASS GRAFTING (CABG);  Surgeon: Gaye Pollack, MD;  Location: [facility];  Service: Open Heart Surgery;  Laterality: N/A;  Times four using endoscopically harvested left greater saphenous vein and left internal mammary artery. Right greater saphenous vein attempted; not appropriate for vein harvest.  . EYE SURGERY     bilateral cataract extraction   . INTRAVASCULAR PRESSURE WIRE/FFR STUDY N/A 08/22/2017   Procedure: INTRAVASCULAR PRESSURE WIRE/FFR STUDY;  Surgeon: Nelva Bush, MD;  Location: [facility] CV LAB;  Service: Cardiovascular;  Laterality: N/A;  . LAPAROSCOPIC GASTRIC BANDING  2010  . LEFT HEART CATHETERIZATION WITH CORONARY ANGIOGRAM N/A 12/11/2011   Procedure: LEFT HEART CATHETERIZATION WITH CORONARY ANGIOGRAM;  Surgeon: Larey Dresser, MD;  Location: [facility] C[facility];  Service: Cardiovascular;  Laterality: N/A;  . LESION REMOVAL  04/09/2012   Procedure: LESION REMOVAL;  Surgeon: Theodoro Kos, DO;  Location: [facility];  Service: Plastics;  Laterality: Bilateral;  . MASTECTOMY W/ SENTINEL NODE BIOPSY  10/18/2011   Procedure: MASTECTOMY WITH SENTINEL LYMPH NODE BIOPSY;  Surgeon: Merrie Roof, MD;  Location: [facility];  Service: General;  Laterality: Bilateral;  bilateral mastectomy and left sentinel node biopsy  . NASAL SINUS SURGERY    . PORT-A-CATH REMOVAL  04/09/2012   Procedure: REMOVAL PORT-A-CATH;  Surgeon: Merrie Roof, MD;  Location: [facility];  Service: General;  Laterality: Right;  . PORTACATH PLACEMENT  12/02/2011   Procedure: INSERTION PORT-A-CATH;  Surgeon: Merrie Roof, MD;  Location: [facility];  Service: General;  Laterality: Right;  . RIGHT/LEFT HEART CATH  AND CORONARY/GRAFT ANGIOGRAPHY N/A 08/22/2017   Procedure: RIGHT/LEFT HEART CATH AND CORONARY/GRAFT ANGIOGRAPHY;  Surgeon: Nelva Bush, MD;  Location: [facility] CV LAB;  Service: Cardiovascular;  Laterality: N/A;  . TONSILLECTOMY    . US ECHOCARDIOGRAPHY     at [place] NAVIGATION Left 11/26/2013   Procedure: VIDEO BRONCHOSCOPY WITH ENDOBRONCHIAL NAVIGATION;  Surgeon: Collene Gobble, MD;  Location: [facility];  Service: Thoracic;  Laterality: Left;     OB History   No obstetric history on file.      Home Medications    Prior to Admission medications   Medication Sig Start Date End Date Taking? Authorizing Provider  albuterol (PROVENTIL) (2.5 MG/3ML) 0.083% nebulizer solution Inhale 3 mLs into the lungs every 6 (six) hours as needed for wheezing or shortness of breath. 02/19/18   [provider]  aspirin EC 81 MG tablet Take 1 tablet (81 mg total) by mouth daily. 04/26/16   Richardson Dopp T, PA-C  atorvastatin (LIPITOR) 80 MG tablet TAKE 1 TABLET BY MOUTH ONCE  DAILY AT  [facility] 11/03/18   End, Harrell Gave, MD  buPROPion (WELLBUTRIN XL) 300 MG 24 hr tablet Take 300 mg by mouth daily.  06/20/17   [provider]  cephALEXin (KEFLEX) 500 MG capsule Take 500 mg by mouth daily.  03/05/17   [provider]  Cholecalciferol (VITAMIN D3) 1000 units CAPS Take 1,000 Units by mouth daily.     [provider]  fluticasone (FLONASE) 50 MCG/ACT nasal spray Place 2 sprays into both nostrils daily.  08/14/13   [provider]  Fluticasone-Salmeterol (ADVAIR) 250-50 MCG/DOSE AEPB Inhale into the lungs. 04/30/19   [provider]  furosemide (LASIX) 40 MG tablet Take 0.5 tablets (20 mg total) by mouth daily. 06/07/19 06/01/20  End, Harrell Gave, MD  Glycopyrrolate-Formoterol (BEVESPI AEROSPHERE) 9-4.8 MCG/ACT AERO Inhale 2 puffs into the lungs daily.    [provider]  isosorbide mononitrate (IMDUR) 60 MG 24 hr  tablet Take 1 tablet (60 mg total) by mouth 2 (two) times daily. 06/09/19 09/07/19  End, Harrell Gave, MD  lisinopril (ZESTRIL) 5 MG tablet Take 1 tablet (5 mg total) by mouth daily. 05/05/19 08/03/19  End, Harrell Gave, MD  loratadine (CLARITIN) 10 MG tablet Take 10 mg by mouth daily.    [provider]  metFORMIN (GLUCOPHAGE) 500 MG tablet Take 1 tablet (500 mg total) by mouth 2 (two) times daily. Patient taking differently: Take 500 mg by mouth every evening.  08/25/17   End, Harrell Gave, MD  metoprolol tartrate (LOPRESSOR) 25 MG tablet Take 1 tablet (25 mg total) by mouth 2 (two) times daily. 01/08/19   End, Harrell Gave, MD  Multiple Vitamin (MULTI VITAMIN DAILY PO) Take 1 tablet by mouth daily.    [provider]  nitroGLYCERIN (NITROSTAT) 0.4 MG SL tablet Place 1 tablet (0.4 mg total) under the tongue every 5 (five) minutes as needed. 06/22/18   End, Harrell Gave, MD  Polyethyl Glycol-Propyl Glycol (SYSTANE) 0.4-0.3 % SOLN Place 1 drop into both eyes 4 (four) times daily as needed (for dry eyes).     [provider]    Family History Family History  Problem Relation Age of Onset  . Heart disease Mother   . Heart disease Father   . Breast cancer Maternal Aunt   . Ovarian cancer Paternal Aunt   . Breast cancer Maternal Aunt   . Anesthesia problems Neg Hx   . Hypotension Neg Hx   . Malignant hyperthermia Neg Hx   . Pseudochol deficiency Neg Hx     Social History Social History   Tobacco Use  . Smoking status: Former Smoker    Packs/day: 2.00    Years: 40.00    Pack years: 80.00    Types: Cigarettes    Quit date: 08/25/2006    Years since quitting: 12.8  . Smokeless tobacco: Never Used  Substance Use Topics  . Alcohol use: Yes    Comment: 1-2 times a year  . Drug use: No     Allergies   Penicillins, Vicodin [hydrocodone-acetaminophen], Sulfa antibiotics, and Sulfamethoxazole   Review of Systems Review of Systems  All other systems reviewed and are  negative.    Physical Exam Updated Vital Signs BP (!) 114/43   Pulse 62   Temp 97.8 F (36.6 C) (Oral)   Resp 15   Ht _0  (1.651 m)   Wt 117.9 kg   SpO2 96%   BMI 43.27 kg/m   Physical Exam Vitals signs and nursing note reviewed.    73 year old  female, resting comfortably and in no acute distress. Vital signs are normal. Oxygen saturation is 96%, which is normal. Head is normocephalic and atraumatic. PERRLA, EOMI. Oropharynx is clear. Neck is nontender and supple without adenopathy or JVD. Back is nontender and there is no CVA tenderness. Lungs are clear without rales, wheezes, or rhonchi. Chest is nontender. Heart has regular rate and rhythm without murmur. Abdomen is soft, flat, nontender without masses or hepatosplenomegaly and peristalsis is hypoactive. Rectal: Normal sphincter tone, no masses.  Moderate amount of dark red blood present. Extremities have no cyanosis or edema, full range of motion is present. Skin is warm and dry without rash. Neurologic: Mental status is normal, cranial nerves are intact, there are no motor or sensory deficits.  ED Treatments / Results  Labs (all labs ordered are listed, but only abnormal results are displayed) Labs Reviewed  COMPREHENSIVE METABOLIC PANEL - Abnormal; Notable for the following components:      Result Value   CO2 21 (*)    Glucose, Bld 149 (*)    Creatinine, Ser 1.26 (*)    Calcium 8.8 (*)    Total Protein 6.1 (*)    Albumin 3.3 (*)    GFR calc non Af Amer 42 (*)    GFR calc Af Amer 49 (*)    All other components within normal limits  CBC - Abnormal; Notable for the following components:   RBC 3.70 (*)    Hemoglobin 10.8 (*)    HCT 33.9 (*)    All other components within normal limits  POC OCCULT BLOOD, ED - Abnormal; Notable for the following components:   Fecal Occult Bld POSITIVE (*)    All other components within normal limits  SARS CORONAVIRUS 2 (TAT 6-24 HRS)  LACTIC ACID, PLASMA  LACTIC ACID,  PLASMA  TYPE AND SCREEN   Procedures Procedures  CRITICAL CARE Performed by: Delora Fuel Total critical care time: 45 minutes Critical care time was exclusive of separately billable procedures and treating other patients. Critical care was necessary to treat or prevent imminent or life-threatening deterioration. Critical care was time spent personally by me on the following activities: development of treatment plan with patient and/or surrogate as well as nursing, discussions with consultants, evaluation of patient's response to treatment, examination of patient, obtaining history from patient or surrogate, ordering and performing treatments and interventions, ordering and review of laboratory studies, ordering and review of radiographic studies, pulse oximetry and re-evaluation of patient's condition.  Medications Ordered in ED Medications  sodium chloride 0.9 % bolus 1,000 mL (0 mLs Intravenous Stopped 06/19/19 0213)     Initial Impression / Assessment and Plan / ED Course  I have reviewed the triage vital signs and the nursing notes.  Pertinent labs & imaging results that were available during my care of the patient were reviewed by me and considered in my medical decision making (see chart for details).  Rectal bleeding of uncertain cause.  Statistically, this is most likely going to be from diverticulosis, consider angiodysplasia.  Doubt upper GI source.  Hemoglobin has come back at 10.8 which is a 3 g drop compared with August 11, 2018.  Blood pressure has remained adequate and heart rate has been stable.  Mild renal insufficiency is present and not significantly changed from baseline.  Case is discussed with Dr. Hal Hope of [facility] hospitalist, who agrees to admit the patient.  Final Clinical Impressions(s) / ED Diagnoses   Final diagnoses:  Rectal bleeding  Anemia due to acute  blood loss  Syncope and collapse    ED Discharge Orders    None       Delora Fuel, MD  98/92/11 [phone]

## 2019-06-19 NOTE — ED Notes (Signed)
Pt states that she needed to use the restroom immediately prior to being triaged.

## 2019-06-19 NOTE — ED Notes (Signed)
Report called to rn on 4e 

## 2019-06-19 NOTE — ED Triage Notes (Addendum)
Pt c/o blood in stool x 5 hours. This nurse witnessed a moderate amount of blood in toilet after pt had a BM. No clots noted. Pt states that she feels that she is going to pass out (pale, cool, clammy).

## 2019-06-19 NOTE — Consult Note (Signed)
Consult Note for [facility]  Reason for Consult: Hematochezia Referring Physician: [facility]  Jamie [name] HPI: This is a 73 year old female with a PMH of diverticulitis, CAD, COPD, DM, hyperlipidemia, breast cancer, and HTN admitted for complaints of hematochezia.  The bleeding started acutely last evening and she reports 6 bouts of bleeding.  IN the hospital she had a small bloody bowel movement.  The patient denies any prior history of hematochezia.  In [facility] she recalls having several colonoscopies, but she cannot remember the last procedure and the reasons for her procedure.  Ever since her MI and CABG, she does not feel that her memory is as sharp.  In the ER her HGB did drop down by 3 grams.  Currently she is hemodynamically stable.  One to two months ago, she was treated for diverticulitis.  Past Medical History:  Diagnosis Date  . Anxiety   . Asthma   . Breast cancer ([place]) 08/2011   lt. breast ca  . CAD (coronary artery disease)    NSTEMI 5/13:  LHC demonstrated oLM 75%, pLAD 80%, RCA occluded, filled by collaterals from the LAD.  Echo 12/10/11: EF 60%.; CABG 12/13/11: LIMA-LAD, SVG-RCA, SVG-diagonal and OM.   Marland Kitchen Carotid artery disease ([place])    a.  Pre-CABG Dopplers 5/13: Bilateral 40-59%; b.  Carotid US 5/09: RICA 32-67%, LICA 12-45%, normal subclavians bilaterally >> FU 6 months;  c. Carotid US 80/99: RICA 83-38%; LICA 25-05%, normal subclavians >> FU 1 year // d. Carotid US 11/17:  Stable 40-59% bilateral ICA stenosis; F/U 1 year.  Marland Kitchen COPD (chronic obstructive pulmonary disease) ([place])   . DM2 (diabetes mellitus, type 2) (HCC)    metformin  . Gallstones    Symptomatic  . HLD (hyperlipidemia)   . Hx of cardiovascular stress test    Myoview 5/16:  small area of inf-lat ischemia, EF 70%; Low Risk >> try medical Rx  . Hx of echocardiogram    Echo 5/16:  EF 60-65%, no RWMA, Gr 1 DD, Ao sclerosis, mild AS, mean 14 mmHg, MAC  . Hx of migraines   . Hypertension   . Myocardial  infarction ([place])   . Obesity   . Shortness of breath   . Sleep apnea    sleep study2-3 yrs ago lost weight afterward but gained back  . UTI (lower urinary tract infection)    freq-bladder implant -removed    Past Surgical History:  Procedure Laterality Date  . ABDOMINAL HYSTERECTOMY  1977  . APPENDECTOMY    . AXILLARY LYMPH NODE DISSECTION  10/18/2011   Procedure: AXILLARY LYMPH NODE DISSECTION;  Surgeon: Merrie Roof, MD;  Location: [facility];  Service: General;  Laterality: Left;  . BREAST RECONSTRUCTION  10/18/2011   Procedure: BREAST RECONSTRUCTION;  Surgeon: Theodoro Kos, DO;  Location: [facility];  Service: Plastics;  Laterality: Bilateral;   bilateral breast reconstruction with bilateral tissue expander and placement of flex hd.  Marland Kitchen CARDIAC CATHETERIZATION    . CESAREAN SECTION  04/1976  . CHOLECYSTECTOMY N/A 08/17/2018   Procedure: LAPAROSCOPIC CHOLECYSTECTOMY;  Surgeon: Coralie Keens, MD;  Location: [facility];  Service: General;  Laterality: N/A;  . CORONARY ARTERY BYPASS GRAFT  12/13/2011   Procedure: CORONARY ARTERY BYPASS GRAFTING (CABG);  Surgeon: Gaye Pollack, MD;  Location: [facility];  Service: Open Heart Surgery;  Laterality: N/A;  Times four using endoscopically harvested left greater saphenous vein and left internal mammary artery. Right greater saphenous vein attempted; not appropriate  for vein harvest.  . EYE SURGERY     bilateral cataract extraction   . INTRAVASCULAR PRESSURE WIRE/FFR STUDY N/A 08/22/2017   Procedure: INTRAVASCULAR PRESSURE WIRE/FFR STUDY;  Surgeon: Nelva Bush, MD;  Location: [facility];  Service: Cardiovascular;  Laterality: N/A;  . LAPAROSCOPIC GASTRIC BANDING  2010  . LEFT HEART CATHETERIZATION WITH CORONARY ANGIOGRAM N/A 12/11/2011   Procedure: LEFT HEART CATHETERIZATION WITH CORONARY ANGIOGRAM;  Surgeon: Larey Dresser, MD;  Location: [facility] C[facility];  Service: Cardiovascular;  Laterality: N/A;  . LESION REMOVAL  04/09/2012   Procedure: LESION  REMOVAL;  Surgeon: Theodoro Kos, DO;  Location: [facility];  Service: Plastics;  Laterality: Bilateral;  . MASTECTOMY W/ SENTINEL NODE BIOPSY  10/18/2011   Procedure: MASTECTOMY WITH SENTINEL LYMPH NODE BIOPSY;  Surgeon: Merrie Roof, MD;  Location: [place];  Service: General;  Laterality: Bilateral;  bilateral mastectomy and left sentinel node biopsy  . NASAL SINUS SURGERY    . PORT-A-CATH REMOVAL  04/09/2012   Procedure: REMOVAL PORT-A-CATH;  Surgeon: Merrie Roof, MD;  Location: [place];  Service: General;  Laterality: Right;  . PORTACATH PLACEMENT  12/02/2011   Procedure: INSERTION PORT-A-CATH;  Surgeon: Merrie Roof, MD;  Location: [place];  Service: General;  Laterality: Right;  . RIGHT/LEFT HEART CATH AND CORONARY/GRAFT ANGIOGRAPHY N/A 08/22/2017   Procedure: RIGHT/LEFT HEART CATH AND CORONARY/GRAFT ANGIOGRAPHY;  Surgeon: Nelva Bush, MD;  Location: [place];  Service: Cardiovascular;  Laterality: N/A;  . TONSILLECTOMY    . US ECHOCARDIOGRAPHY     at [facility] NAVIGATION Left 11/26/2013   Procedure: VIDEO BRONCHOSCOPY WITH ENDOBRONCHIAL NAVIGATION;  Surgeon: Collene Gobble, MD;  Location: [facility];  Service: Thoracic;  Laterality: Left;    Family History  Problem Relation Age of Onset  . Heart disease Mother   . Heart disease Father   . Breast cancer Maternal Aunt   . Ovarian cancer Paternal Aunt   . Breast cancer Maternal Aunt   . Anesthesia problems Neg Hx   . Hypotension Neg Hx   . Malignant hyperthermia Neg Hx   . Pseudochol deficiency Neg Hx     Social History:  reports that she quit smoking about 12 years ago. Her smoking use included cigarettes. She has a 80.00 pack-year smoking history. She has never used smokeless tobacco. She reports current alcohol use. She reports that she does not use drugs.  Allergies:  Allergies  Allergen Reactions  . Penicillins Nausea Only and Other  (See Comments)    DID THE REACTION INVOLVE: Swelling of the face/tongue/throat, SOB, or low BP? No Sudden or severe rash/hives, skin peeling, or the inside of the mouth or nose? No Did it require medical treatment? #  #  #  YES  #  #  #  When did it last happen? Unknown   . Vicodin [Hydrocodone-Acetaminophen] Other (See Comments)    Syncope   . Sulfa Antibiotics Rash  . Sulfamethoxazole Rash    Medications:  Scheduled: . atorvastatin  80 mg Oral q1800  . insulin aspart  0-9 Units Subcutaneous Q4H  . metoprolol tartrate  25 mg Oral BID  . mometasone-formoterol  2 puff Inhalation BID  . mupirocin ointment   Nasal BID  . pantoprazole (PROTONIX) IV  40 mg Intravenous Q12H  . polyethylene glycol-electrolytes  4,000 mL Oral Once   Continuous: . sodium chloride 75 mL/hr at 06/19/19  37  . sodium chloride      Results for orders placed or performed during the hospital encounter of 06/19/19 (from the past 24 hour(s))  Comprehensive metabolic panel     Status: Abnormal   Collection Time: 06/19/19  1:03 AM  Result Value Ref Range   Sodium 139 135 - 145 mmol/L   Potassium 4.0 3.5 - 5.1 mmol/L   Chloride 106 98 - 111 mmol/L   CO2 21 (L) 22 - 32 mmol/L   Glucose, Bld 149 (H) 70 - 99 mg/dL   BUN 23 8 - 23 mg/dL   Creatinine, Ser 1.26 (H) 0.44 - 1.00 mg/dL   Calcium 8.8 (L) 8.9 - 10.3 mg/dL   Total Protein 6.1 (L) 6.5 - 8.1 g/dL   Albumin 3.3 (L) 3.5 - 5.0 g/dL   AST 24 15 - 41 U/L   ALT 25 0 - 44 U/L   Alkaline Phosphatase 64 38 - 126 U/L   Total Bilirubin 0.4 0.3 - 1.2 mg/dL   GFR calc non Af Amer 42 (L) >60 mL/min   GFR calc Af Amer 49 (L) >60 mL/min   Anion gap 12 5 - 15  CBC     Status: Abnormal   Collection Time: 06/19/19  1:03 AM  Result Value Ref Range   WBC 8.8 4.0 - 10.5 K/uL   RBC 3.70 (L) 3.87 - 5.11 MIL/uL   Hemoglobin 10.8 (L) 12.0 - 15.0 g/dL   HCT 33.9 (L) 36.0 - 46.0 %   MCV 91.6 80.0 - 100.0 fL   MCH 29.2 26.0 - 34.0 pg   MCHC 31.9 30.0 - 36.0 g/dL   RDW  13.7 11.5 - 15.5 %   Platelets 221 150 - 400 K/uL   nRBC 0.0 0.0 - 0.2 %  Type and screen [employer]     Status: None   Collection Time: 06/19/19  1:04 AM  Result Value Ref Range   ABO/RH(D) O NEG    Antibody Screen NEG    Sample Expiration      06/22/2019,2359 Performed at [facility], [address], [place], [place] [address]   POC occult blood, ED     Status: Abnormal   Collection Time: 06/19/19  1:18 AM  Result Value Ref Range   Fecal Occult Bld POSITIVE (A) NEGATIVE  SARS CORONAVIRUS 2 (TAT 6-24 HRS) Nasopharyngeal Nasopharyngeal Swab     Status: None   Collection Time: 06/19/19  1:20 AM   Specimen: Nasopharyngeal Swab  Result Value Ref Range   SARS Coronavirus 2 NEGATIVE NEGATIVE  Lactic acid, plasma     Status: None   Collection Time: 06/19/19  3:05 AM  Result Value Ref Range   Lactic Acid, Venous 1.8 0.5 - 1.9 mmol/L  CBG monitoring, ED     Status: Abnormal   Collection Time: 06/19/19  3:33 AM  Result Value Ref Range   Glucose-Capillary 132 (H) 70 - 99 mg/dL  MRSA PCR Screening     Status: Abnormal   Collection Time: 06/19/19  4:25 AM   Specimen: Nasal Mucosa; Nasopharyngeal  Result Value Ref Range   MRSA by PCR POSITIVE (A) NEGATIVE  Lactic acid, plasma     Status: None   Collection Time: 06/19/19  5:14 AM  Result Value Ref Range   Lactic Acid, Venous 1.7 0.5 - 1.9 mmol/L  CBC     Status: Abnormal   Collection Time: 06/19/19  5:14 AM  Result Value Ref Range  WBC 8.9 4.0 - 10.5 K/uL   RBC 3.47 (L) 3.87 - 5.11 MIL/uL   Hemoglobin 9.8 (L) 12.0 - 15.0 g/dL   HCT 32.2 (L) 36.0 - 46.0 %   MCV 92.8 80.0 - 100.0 fL   MCH 28.2 26.0 - 34.0 pg   MCHC 30.4 30.0 - 36.0 g/dL   RDW 13.6 11.5 - 15.5 %   Platelets 164 150 - 400 K/uL   nRBC 0.0 0.0 - 0.2 %  Glucose, capillary     Status: Abnormal   Collection Time: 06/19/19  5:16 AM  Result Value Ref Range   Glucose-Capillary 121 (H) 70 - 99 mg/dL  Glucose, capillary     Status: None    Collection Time: 06/19/19  8:17 AM  Result Value Ref Range   Glucose-Capillary 94 70 - 99 mg/dL   Comment 1 Notify RN    Comment 2 Document in Chart   CBC     Status: Abnormal   Collection Time: 06/19/19  9:11 AM  Result Value Ref Range   WBC 7.2 4.0 - 10.5 K/uL   RBC 3.28 (L) 3.87 - 5.11 MIL/uL   Hemoglobin 9.6 (L) 12.0 - 15.0 g/dL   HCT 30.0 (L) 36.0 - 46.0 %   MCV 91.5 80.0 - 100.0 fL   MCH 29.3 26.0 - 34.0 pg   MCHC 32.0 30.0 - 36.0 g/dL   RDW 13.6 11.5 - 15.5 %   Platelets 135 (L) 150 - 400 K/uL   nRBC 0.0 0.0 - 0.2 %  Glucose, capillary     Status: Abnormal   Collection Time: 06/19/19 11:43 AM  Result Value Ref Range   Glucose-Capillary 161 (H) 70 - 99 mg/dL   Comment 1 Notify RN    Comment 2 Document in Chart   CBC     Status: Abnormal   Collection Time: 06/19/19 12:56 PM  Result Value Ref Range   WBC 5.5 4.0 - 10.5 K/uL   RBC 3.20 (L) 3.87 - 5.11 MIL/uL   Hemoglobin 9.4 (L) 12.0 - 15.0 g/dL   HCT 29.5 (L) 36.0 - 46.0 %   MCV 92.2 80.0 - 100.0 fL   MCH 29.4 26.0 - 34.0 pg   MCHC 31.9 30.0 - 36.0 g/dL   RDW 13.7 11.5 - 15.5 %   Platelets 145 (L) 150 - 400 K/uL   nRBC 0.0 0.0 - 0.2 %  Glucose, capillary     Status: Abnormal   Collection Time: 06/19/19  4:42 PM  Result Value Ref Range   Glucose-Capillary 105 (H) 70 - 99 mg/dL   Comment 1 Notify RN    Comment 2 Document in Chart      No results found.  ROS:  As stated above in the HPI otherwise negative.  Blood pressure (!) 145/67, pulse 88, temperature 97.6 F (36.4 C), temperature source Oral, resp. rate 18, height _0  (1.651 m), weight 114 kg, SpO2 100 %.    PE: Gen: NAD, Alert and Oriented HEENT:  [place]/AT, EOMI Neck: Supple, no LAD Lungs: CTA Bilaterally CV: RRR without M/G/R ABM: Soft, NTND, +BS Ext: No C/C/E  Assessment/Plan: 1) Lower GI bleed - probable diverticular bleed. 2) CAD. 3) Obesity.   The patient is stable.  With her history of diverticulitis and her current complaints of  hematochezia, she most likely has a diverticular bleed.  Access to her prior colonoscopies are not possible.  The plan will be to perform a colonoscopy.  Plan: 1) Colonoscopy tomorrow.  Swain Acree D 06/19/2019, 5:41 PM

## 2019-06-20 ENCOUNTER — Observation Stay (HOSPITAL_COMMUNITY): Payer: Medicare Other | Admitting: Anesthesiology

## 2019-06-20 ENCOUNTER — Encounter (HOSPITAL_COMMUNITY)
Admission: EM | Disposition: A | Discharge status: Home / Self Care | Payer: Self-pay | Source: Home / Self Care | Attending: Emergency Medicine

## 2019-06-20 DIAGNOSIS — K922 Gastrointestinal hemorrhage, unspecified: Secondary | ICD-10-CM | POA: Diagnosis not present

## 2019-06-20 DIAGNOSIS — J449 Chronic obstructive pulmonary disease, unspecified: Secondary | ICD-10-CM | POA: Diagnosis not present

## 2019-06-20 DIAGNOSIS — Z20828 Contact with and (suspected) exposure to other viral communicable diseases: Secondary | ICD-10-CM | POA: Diagnosis not present

## 2019-06-20 DIAGNOSIS — K921 Melena: Secondary | ICD-10-CM | POA: Diagnosis not present

## 2019-06-20 DIAGNOSIS — D123 Benign neoplasm of transverse colon: Secondary | ICD-10-CM | POA: Diagnosis not present

## 2019-06-20 HISTORY — PX: POLYPECTOMY: SHX5525

## 2019-06-20 HISTORY — PX: COLONOSCOPY WITH PROPOFOL: SHX5780

## 2019-06-20 LAB — GLUCOSE, CAPILLARY
Glucose-Capillary: 120 mg/dL — ABNORMAL HIGH (ref 70–99)
Glucose-Capillary: 128 mg/dL — ABNORMAL HIGH (ref 70–99)
Glucose-Capillary: 99 mg/dL (ref 70–99)
Glucose-Capillary: 99 mg/dL (ref 70–99)

## 2019-06-20 LAB — BASIC METABOLIC PANEL
Anion gap: 10 (ref 5–15)
BUN: 11 mg/dL (ref 8–23)
CO2: 20 mmol/L — ABNORMAL LOW (ref 22–32)
Calcium: 8.5 mg/dL — ABNORMAL LOW (ref 8.9–10.3)
Chloride: 113 mmol/L — ABNORMAL HIGH (ref 98–111)
Creatinine, Ser: 1.15 mg/dL — ABNORMAL HIGH (ref 0.44–1.00)
GFR calc Af Amer: 54 mL/min — ABNORMAL LOW (ref >60)
GFR calc non Af Amer: 47 mL/min — ABNORMAL LOW (ref >60)
Glucose, Bld: 104 mg/dL — ABNORMAL HIGH (ref 70–99)
Potassium: 3.8 mmol/L (ref 3.5–5.1)
Sodium: 143 mmol/L (ref 135–145)

## 2019-06-20 LAB — CBC
HCT: 30.7 % — ABNORMAL LOW (ref 36.0–46.0)
Hemoglobin: 9.7 g/dL — ABNORMAL LOW (ref 12.0–15.0)
MCH: 29.2 pg (ref 26.0–34.0)
MCHC: 31.6 g/dL (ref 30.0–36.0)
MCV: 92.5 fL (ref 80.0–100.0)
Platelets: 150 10*3/uL (ref 150–400)
RBC: 3.32 MIL/uL — ABNORMAL LOW (ref 3.87–5.11)
RDW: 13.8 % (ref 11.5–15.5)
WBC: 5.1 10*3/uL (ref 4.0–10.5)
nRBC: 0 % (ref 0.0–0.2)

## 2019-06-20 LAB — PROTIME-INR
INR: 1.1 (ref 0.8–1.2)
Prothrombin Time: 14 seconds (ref 11.4–15.2)

## 2019-06-20 SURGERY — COLONOSCOPY WITH PROPOFOL
Anesthesia: Monitor Anesthesia Care

## 2019-06-20 MED ORDER — LACTATED RINGERS IV SOLN
INTRAVENOUS | Status: DC | PRN
  Administered 2019-06-20: 09:00:00 via INTRAVENOUS

## 2019-06-20 MED ORDER — PROPOFOL 10 MG/ML IV BOLUS
INTRAVENOUS | Status: DC | PRN
  Administered 2019-06-20: 10 mg via INTRAVENOUS
  Administered 2019-06-20: 20 mg via INTRAVENOUS

## 2019-06-20 MED ORDER — ONDANSETRON HCL 4 MG/2ML IJ SOLN
INTRAMUSCULAR | Status: DC | PRN
  Administered 2019-06-20: 4 mg via INTRAVENOUS

## 2019-06-20 MED ORDER — PROPOFOL 500 MG/50ML IV EMUL
INTRAVENOUS | Status: DC | PRN
  Administered 2019-06-20: 75 ug/kg/min via INTRAVENOUS

## 2019-06-20 SURGICAL SUPPLY — 21 items

## 2019-06-20 NOTE — Transfer of Care (Signed)
Immediate Anesthesia Transfer of Care Note  Patient: Jamie [name]  Procedure(s) Performed: COLONOSCOPY WITH PROPOFOL (N/A ) POLYPECTOMY  Patient Location: PACU  Anesthesia Type:MAC  Level of Consciousness: awake and alert   Airway & Oxygen Therapy: Patient Spontanous Breathing and Patient connected to nasal cannula oxygen  Post-op Assessment: Report given to RN and Post -op Vital signs reviewed and stable  Post vital signs: Reviewed and stable  Last Vitals:  Vitals Value Taken Time  BP 100/54 06/20/19 0941  Temp 37.2 C 06/20/19 0940  Pulse 66 06/20/19 0943  Resp 16 06/20/19 0943  SpO2 96 % 06/20/19 0943  Vitals shown include unvalidated device data.  Last Pain:  Vitals:   06/20/19 0940  TempSrc: Temporal  PainSc: 0-No pain         Complications: No apparent anesthesia complications

## 2019-06-20 NOTE — Progress Notes (Signed)
IV's and telemetry discontinued. CCMD notified. Discharge instructions reviewed with patient. All questions answered.

## 2019-06-20 NOTE — Discharge Summary (Signed)
Physician Discharge Summary  Patient ID: Jamie [name] MRN: [id number] DOB/AGE: [birth year] 73 y.o.  Admit date: 06/19/2019 Discharge date: 06/20/2019  Admission Diagnoses: GI bleed  Discharge Diagnoses:  Principal Problem:   Acute GI bleeding Active Problems:   COPD (chronic obstructive pulmonary disease) (HCC)   Coronary artery disease of native heart with stable angina pectoris (HCC)   OSA (obstructive sleep apnea)   Diabetes mellitus, type 2 (HCC)   Malignant neoplasm of overlapping sites of left breast in female, estrogen receptor positive (HCC)   Chronic diastolic heart failure (HCC)   Essential hypertension   Acute blood loss anemia   Discharged Condition: fair  Hospital Course:   HPI on Admission: Jamie [name] is a 73 y.o. female with history of CAD status post CABG, COPD, breast cancer in remission, chronic kidney disease stage III, diabetes mellitus type 2 presents to the ER after patient had multiple episodes of frank rectal bleeding since last evening.  Patient states that she had frank rectal bleeding without any abdominal pain but did have some gurgling sound before the episodes.  Denies any nausea vomiting chest pain or shortness of breath.  While waiting in the ER patient almost passed out. ED Course: In the ER patient's vital signs were stable but EKG showed normal sinus rhythm hemoglobin was around 10 and a drop of about 3 g from January of this year.  Creatinine was 1.2 LFTs were normal potassium 4 9 lactic acid 1.8.  COVID-19 test is pending.  Patient admitted for acute GI bleeding.   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 Patient states he has had a colonoscopy multiple times at [facility] records of which of are not accessible. - S/P Colonoscopy on 06/20/19:  - Two 3 to 4 mm polyps in the transverse colon, removed with a cold snare. Resected and retrieved.   - Diverticulosis in the sigmoid colon. Recommendation: - Resume regular diet.                            - Continue present medications.                           - Await pathology results.                           - Repeat colonoscopy in 7 years for surveillance.                           - Okay to D/C home.                          - Follow up with primary GI in [place].  Pt was advised to f/u with her GI. Warning S/S given for which she must seek immediate medical attention. Pt expressed understandings.   2. Acute blood loss anemia follow CBC. Held ASA on admission. But new bleeding noticed. H/H remained stable.  - May resume ASA  3. History of CAD denies any chest pain. Patient's blood pressure remained stable will continue patient's beta-blocker. Cont home meds  - May resume home meds   4. Sleep apnea mention in the chart but patient states he does not use CPAP. - Rec to use it - f/u PCP   5. Diabetes mellitus type 2: Was on sliding scale coverage. - May resume home meds  - F/U PCp   6. Chronic kidney disease: - stage III creatinine appears to be at baseline.  7. Breast cancer: in remission.   Consults: GI  Significant Diagnostic Studies: Colonoscopy.   Treatments: Regular   Discharge Exam: Blood pressure (!) 152/64, pulse 77, temperature 97.8 F (36.6 C), temperature source Oral, resp. rate 19, height 5\' 5"  (1.651 m), weight 115 kg, SpO2 97 %.  Eyes:Nonicteric no pallor. ENMT:No discharge from the ears eyes nose or mouth. Neck:No mass felt. No neck rigidity. Respiratory:No rhonchi  or crepitations. Cardiovascular:S1-S2 heard. Abdomen:Soft nontender bowel sounds present. Musculoskeletal:No edema. No joint effusion. Skin:No rash. Neurologic:Alert awake oriented to time place and person. Moves all extremities. Psychiatric:Appears normal per normal affect.  Disposition: Discharge disposition: 01-Home or Self Care     Tolerated regular diet well. OK to d/c home with diet, meds and f/u instructions.    Discharge Instructions    Call MD for:   Complete by: As directed    Call MD experiences shortness of breath, chest pain, palpitation, focal neurological deficit, extreme lightheadedness, hematemesis, hematochezia, coffee-ground emesis or black tarry stool.   Diet - low sodium heart healthy   Complete by: As directed    Discharge instructions   Complete by: As directed    Diverticulosis   Increase activity slowly   Complete by: As directed      Allergies as of 06/20/2019      Reactions   Penicillins Nausea Only, Other (See Comments)   DID THE REACTION INVOLVE: Swelling of the face/tongue/throat, SOB, or low BP? No Sudden or severe rash/hives, skin peeling, or the inside of the mouth or nose? No Did it require medical treatment? #  #  #  YES  #  #  #  When did it last happen? Unknown   Vicodin [hydrocodone-acetaminophen] Other (See Comments)   Syncope   Sulfa Antibiotics Rash   Sulfamethoxazole Rash      Medication List    TAKE these medications   albuterol (2.5 MG/3ML) 0.083% nebulizer solution Commonly known as: PROVENTIL Inhale 3 mLs into the lungs every 6 (six) hours as needed for wheezing or shortness of breath.   aspirin EC 81 MG tablet Take 1 tablet (81 mg total) by mouth daily.   atorvastatin 80 MG tablet Commonly known as: LIPITOR TAKE 1 TABLET BY MOUTH ONCE DAILY AT  6PM What changed: See the new instructions.   Bevespi Aerosphere 9-4.8 MCG/ACT Aero Generic drug: Glycopyrrolate-Formoterol Inhale 2 puffs into the lungs daily.    buPROPion 300 MG 24 hr tablet Commonly known as: WELLBUTRIN XL Take 300 mg by mouth daily.   cephALEXin 500 MG capsule Commonly known as: KEFLEX Take 500 mg by mouth daily.   fluticasone 50 MCG/ACT nasal spray Commonly known as: FLONASE Place 2 sprays into both nostrils daily.   Fluticasone-Salmeterol 250-50 MCG/DOSE Aepb Commonly known as: ADVAIR Inhale 1 puff into the lungs 2 (two) times daily.   furosemide 20 MG tablet Commonly known as: LASIX Take 20 mg by mouth 2 (two) times daily. What changed: Another medication with the same name  was removed. Continue taking this medication, and follow the directions you see here.   isosorbide mononitrate 60 MG 24 hr tablet Commonly known as: IMDUR Take 1 tablet (60 mg total) by mouth 2 (two) times daily.   lisinopril 5 MG tablet Commonly known as: ZESTRIL Take 1 tablet (5 mg total) by mouth daily.   loratadine 10 MG tablet Commonly known as: CLARITIN Take 10 mg by mouth daily.   metFORMIN 500 MG tablet Commonly known as: GLUCOPHAGE Take 1 tablet (500 mg total) by mouth 2 (two) times daily. What changed: when to take this   metoprolol tartrate 25 MG tablet Commonly known as: LOPRESSOR Take 1 tablet (25 mg total) by mouth 2 (two) times daily.   MULTI VITAMIN DAILY PO Take 1 tablet by mouth daily.   nitroGLYCERIN 0.4 MG SL tablet Commonly known as: NITROSTAT Place 1 tablet (0.4 mg total) under the tongue every 5 (five) minutes as needed.   Systane 0.4-0.3 % Soln Generic drug: Polyethyl Glycol-Propyl Glycol Place 1 drop into both eyes 4 (four) times daily as needed (for dry eyes).   Vitamin D3 25 MCG (1000 UT) Caps Take 1,000 Units by mouth daily.        Signed: Thornell Mule 06/20/2019, 1:55 PM

## 2019-06-20 NOTE — Op Note (Signed)
[facility] Patient Name: Jamie [name] Procedure Date : 06/20/2019 MRN: [id number] Attending MD: Carol Ada , MD Date of Birth: 1946-06-05 CSN: [id number] Age: 73 Admit Type: Inpatient Procedure:                Colonoscopy Indications:              Hematochezia Providers:                Carol Ada, MD, Glori Bickers, RN, Laverda Sorenson,                            Technician, Eligha Bridegroom CRNA, CRNA Referring MD:              Medicines:                Propofol per Anesthesia Complications:            No immediate complications. Estimated Blood Loss:     Estimated blood loss: none. Procedure:                Pre-Anesthesia Assessment:                           - Prior to the procedure, a History and Physical                            was performed, and patient medications and                            allergies were reviewed. The patient's tolerance of                            previous anesthesia was also reviewed. The risks                            and benefits of the procedure and the sedation                            options and risks were discussed with the patient.                            All questions were answered, and informed consent                            was obtained. Prior Anticoagulants: The patient has                            taken no previous anticoagulant or antiplatelet                            agents. ASA Grade Assessment: II - A patient with                            mild systemic disease. After reviewing the risks  and benefits, the patient was deemed in                            satisfactory condition to undergo the procedure.                           - Sedation was administered by an anesthesia                            professional. Deep sedation was attained.                           After obtaining informed consent, the colonoscope                            was passed under direct vision.  Throughout the                            procedure, the patient's blood pressure, pulse, and                            oxygen saturations were monitored continuously. The                            [id number] Olympus colonoscope was                            introduced through the anus and advanced to the the                            cecum, identified by appendiceal orifice and                            ileocecal valve. The colonoscopy was somewhat                            difficult due to significant looping. Successful                            completion of the procedure was aided by using                            manual pressure and straightening and shortening                            the scope to obtain bowel loop reduction. The                            patient tolerated the procedure well. The quality                            of the bowel preparation was good. The ileocecal  valve, appendiceal orifice, and rectum were                            photographed. Scope In: 9:08:48 AM Scope Out: 9:33:53 AM Scope Withdrawal Time: 0 hours 18 minutes 34 seconds  Total Procedure Duration: 0 hours 25 minutes 5 seconds  Findings:      Two sessile polyps were found in the transverse colon. The polyps were 3       to 4 mm in size. These polyps were removed with a cold snare. Resection       and retrieval were complete.      Scattered small and large-mouthed diverticula were found in the sigmoid       colon. Impression:               - Two 3 to 4 mm polyps in the transverse colon,                            removed with a cold snare. Resected and retrieved.                           - Diverticulosis in the sigmoid colon. Recommendation:           - Return patient to hospital ward for ongoing care.                           - Resume regular diet.                           - Continue present medications.                           - Await  pathology results.                           - Repeat colonoscopy in 7 years for surveillance.                           - Okay to D/C home.                           - Follow up with primary GI in [place]. Procedure Code(s):        --- Professional ---                           [id number], Colonoscopy, flexible; with removal of                            tumor(s), polyp(s), or other lesion(s) by snare                            technique Diagnosis Code(s):        --- Professional ---                           K63.5, Polyp of colon  K92.1, Melena (includes Hematochezia)                           K57.30, Diverticulosis of large intestine without                            perforation or abscess without bleeding CPT copyright 2019 American Medical Association. All rights reserved. The codes documented in this report are preliminary and upon coder review may  be revised to meet current compliance requirements. Carol Ada, MD Carol Ada, MD 06/20/2019 9:47:45 AM This report has been signed electronically. Number of Addenda: 0

## 2019-06-20 NOTE — Care Management Obs Status (Signed)
[place] NOTIFICATION   Patient Details  Name: TITIANNA NOLDE MRN: [id number] Date of Birth: 1946/03/09   Medicare Observation Status Notification Given:  Yes    Claudie Leach, RN 06/20/2019, 1:10 PM

## 2019-06-20 NOTE — Anesthesia Procedure Notes (Signed)
Procedure Name: MAC Date/Time: 06/20/2019 9:08 AM Performed by: Eligha Bridegroom, CRNA Pre-anesthesia Checklist: Patient identified, Emergency Drugs available, Suction available, Timeout performed and Patient being monitored Patient Re-evaluated:Patient Re-evaluated prior to induction Oxygen Delivery Method: Nasal cannula Preoxygenation: Pre-oxygenation with 100% oxygen Induction Type: IV induction

## 2019-06-20 NOTE — Progress Notes (Signed)
Patient back to room at this time. V/s and assessment complete. Patient resting with no complaints. Will continue to monitor.  Emelda Fear, RN

## 2019-06-20 NOTE — Anesthesia Postprocedure Evaluation (Signed)
Anesthesia Post Note  Patient: Jamie [name]  Procedure(s) Performed: COLONOSCOPY WITH PROPOFOL (N/A ) POLYPECTOMY     Patient location during evaluation: PACU Anesthesia Type: MAC Level of consciousness: awake and alert Pain management: pain level controlled Vital Signs Assessment: post-procedure vital signs reviewed and stable Respiratory status: spontaneous breathing, nonlabored ventilation, respiratory function stable and patient connected to nasal cannula oxygen Cardiovascular status: stable and blood pressure returned to baseline Postop Assessment: no apparent nausea or vomiting Anesthetic complications: no    Last Vitals:  Vitals:   06/20/19 1000 06/20/19 1021  BP: (!) 144/53 (!) 152/64  Pulse: 75 77  Resp: 19   Temp:  36.6 C  SpO2: 96% 97%    Last Pain:  Vitals:   06/20/19 1021  TempSrc: Oral  PainSc:                  [name]

## 2019-06-20 NOTE — Plan of Care (Signed)
Continue to monitor

## 2019-06-20 NOTE — Plan of Care (Signed)
Discharge to home °

## 2019-06-20 NOTE — Anesthesia Preprocedure Evaluation (Addendum)
Anesthesia Evaluation  Patient identified by MRN, date of birth, ID band Patient awake    Reviewed: Allergy & Precautions, NPO status , Patient's Chart, lab work & pertinent test results  Airway Mallampati: III  TM Distance: >3 FB Neck ROM: Full    Dental  (+) Chipped, Poor Dentition   Pulmonary shortness of breath and with exertion, asthma , sleep apnea , COPD,  COPD inhaler, former smoker,    Pulmonary exam normal breath sounds clear to auscultation       Cardiovascular hypertension, Pt. on medications and Pt. on home beta blockers + angina + CAD, + Past MI, + CABG and +CHF  Normal cardiovascular exam Rhythm:Regular Rate:Normal  ECG: NSR, rate 63  ECHO: 1. Left ventricular ejection fraction, by visual estimation, is 60 to 65%. The left ventricle has normal function. There is no left ventricular hypertrophy. 2. Left ventricular diastolic parameters are consistent with Grade I diastolic dysfunction (impaired relaxation). 3. Global right ventricle has normal systolic function.The right ventricular size is normal. No increase in right ventricular wall thickness. 4. Left atrial size was normal. 5. Right atrial size was normal. 6. The mitral valve is normal in structure. No evidence of mitral valve regurgitation. No evidence of mitral stenosis. 7. The tricuspid valve is normal in structure. Tricuspid valve regurgitation is not demonstrated. 8. Aortic valve mean gradient measures 20.0 mmHg. 9. Aortic valve peak gradient measures 37.0 mmHg. 10. The aortic valve is normal in structure. Aortic valve regurgitation is not visualized. Moderate aortic valve stenosis. 11. There is Moderate sclerosis of the aortic valve. 12. There is Moderate thickening of the aortic valve. 13. The pulmonic valve was normal in structure. Pulmonic valve regurgitation is not visualized. 14. The inferior vena cava is normal in size with greater than 50%  respiratory variability, suggesting right atrial pressure of 3 mmHg. In comparison to the previous echocardiogram(s):   Neuro/Psych Anxiety negative neurological ROS     GI/Hepatic negative GI ROS, Neg liver ROS,   Endo/Other  diabetes, Oral Hypoglycemic AgentsMorbid obesity  Renal/GU negative Renal ROS     Musculoskeletal negative musculoskeletal ROS (+)   Abdominal (+) + obese,   Peds  Hematology  (+) anemia , HLD   Anesthesia Other Findings GI bleed  Reproductive/Obstetrics                            Anesthesia Physical Anesthesia Plan  ASA: IV  Anesthesia Plan: MAC   Post-op Pain Management:    Induction: Intravenous  PONV Risk Score and Plan: 2 and Propofol infusion and Treatment may vary due to age or medical condition  Airway Management Planned: Simple Face Mask  Additional Equipment:   Intra-op Plan:   Post-operative Plan:   Informed Consent: I have reviewed the patients History and Physical, chart, labs and discussed the procedure including the risks, benefits and alternatives for the proposed anesthesia with the patient or authorized representative who has indicated his/her understanding and acceptance.     Dental advisory given  Plan Discussed with: CRNA  Anesthesia Plan Comments:        Anesthesia Quick Evaluation

## 2019-06-21 ENCOUNTER — Encounter (HOSPITAL_COMMUNITY): Payer: Self-pay | Admitting: Gastroenterology

## 2019-06-22 LAB — SURGICAL PATHOLOGY

## 2019-07-14 NOTE — Progress Notes (Signed)
Follow-up Outpatient Visit Date: 07/15/2019  Primary Care Provider: Verdell Carmine., MD [address] [place] [address]  Chief Complaint: Follow-up coronary artery disease  HPI:  Jamie [name] is a 73 y.o. female with history of coronary artery disease status post CABG (6045), diastolic heart failure, hyperlipidemia, carotid artery stenosis, breast cancer status post bilateral mastectomies, diabetes mellitus, COPD, sleep apnea, and obesity, who presents for follow-up of shortness of breath and coronary artery disease, who presents for follow-up of shortness of breath (this has been her anginal equivalent).  I last saw her in early November, at which time she continued to have significant exertional dyspnea.  We agreed to increase isosorbide mononitrate to 60 mg twice daily.  Unfortunately, this did not seem to make much difference in her symptoms.  She was hospitalized in mid November with rectal bleeding, found to be due to diverticulosis.  She noticed worsening shortness of breath and fatigue in the setting of her acute GI bleed, though her symptoms have returned to baseline.  Follow-up hemoglobins through her PCP reportedly are still not back to her prediverticular bleed levels.  She denies chest pain, palpitations, lightheadedness, and edema.  --------------------------------------------------------------------------------------------------  Past Medical History:  Diagnosis Date  . Anxiety   . Asthma   . Breast cancer ([place]) 08/2011   lt. breast ca  . CAD (coronary artery disease)    NSTEMI 5/13:  LHC demonstrated oLM 75%, pLAD 80%, RCA occluded, filled by collaterals from the LAD.  Echo 12/10/11: EF 60%.; CABG 12/13/11: LIMA-LAD, SVG-RCA, SVG-diagonal and OM.   Marland Kitchen Carotid artery disease ([place])    a.  Pre-CABG Dopplers 5/13: Bilateral 40-59%; b.  Carotid US 4/09: RICA 81-19%, LICA 14-78%, normal subclavians bilaterally >> FU 6 months;  c. Carotid US 29/56: RICA 21-30%; LICA 86-57%,  normal subclavians >> FU 1 year // d. Carotid US 11/17:  Stable 40-59% bilateral ICA stenosis; F/U 1 year.  Marland Kitchen COPD (chronic obstructive pulmonary disease) ([place])   . DM2 (diabetes mellitus, type 2) ([facility])    metformin  . Gallstones    Symptomatic  . HLD (hyperlipidemia)   . Hx of cardiovascular stress test    Myoview 5/16:  small area of inf-lat ischemia, EF 70%; Low Risk >> try medical Rx  . Hx of echocardiogram    Echo 5/16:  EF 60-65%, no RWMA, Gr 1 DD, Ao sclerosis, mild AS, mean 14 mmHg, MAC  . Hx of migraines   . Hypertension   . Myocardial infarction ([place])   . Obesity   . Shortness of breath   . Sleep apnea    sleep study2-3 yrs ago lost weight afterward but gained back  . UTI (lower urinary tract infection)    freq-bladder implant -removed   Past Surgical History:  Procedure Laterality Date  . ABDOMINAL HYSTERECTOMY  1977  . APPENDECTOMY    . AXILLARY LYMPH NODE DISSECTION  10/18/2011   Procedure: AXILLARY LYMPH NODE DISSECTION;  Surgeon: Merrie Roof, MD;  Location: [place];  Service: General;  Laterality: Left;  . BREAST RECONSTRUCTION  10/18/2011   Procedure: BREAST RECONSTRUCTION;  Surgeon: Theodoro Kos, DO;  Location: [place];  Service: Plastics;  Laterality: Bilateral;   bilateral breast reconstruction with bilateral tissue expander and placement of flex hd.  Marland Kitchen CARDIAC CATHETERIZATION    . CESAREAN SECTION  04/1976  . CHOLECYSTECTOMY N/A 08/17/2018   Procedure: LAPAROSCOPIC CHOLECYSTECTOMY;  Surgeon: Coralie Keens, MD;  Location: [place];  Service: General;  Laterality:  N/A;  . COLONOSCOPY WITH PROPOFOL N/A 06/20/2019   Procedure: COLONOSCOPY WITH PROPOFOL;  Surgeon: Carol Ada, MD;  Location: [place];  Service: Endoscopy;  Laterality: N/A;  . CORONARY ARTERY BYPASS GRAFT  12/13/2011   Procedure: CORONARY ARTERY BYPASS GRAFTING (CABG);  Surgeon: Gaye Pollack, MD;  Location: [place];  Service: Open Heart Surgery;  Laterality: N/A;  Times four using endoscopically  harvested left greater saphenous vein and left internal mammary artery. Right greater saphenous vein attempted; not appropriate for vein harvest.  . EYE SURGERY     bilateral cataract extraction   . INTRAVASCULAR PRESSURE WIRE/FFR STUDY N/A 08/22/2017   Procedure: INTRAVASCULAR PRESSURE WIRE/FFR STUDY;  Surgeon: Nelva Bush, MD;  Location: [place] CV LAB;  Service: Cardiovascular;  Laterality: N/A;  . LAPAROSCOPIC GASTRIC BANDING  2010  . LEFT HEART CATHETERIZATION WITH CORONARY ANGIOGRAM N/A 12/11/2011   Procedure: LEFT HEART CATHETERIZATION WITH CORONARY ANGIOGRAM;  Surgeon: Larey Dresser, MD;  Location[facility] CATH LAB;  Service: Cardiovascular;  Laterality: N/A;  . LESION REMOVAL  04/09/2012   Procedure: LESION REMOVAL;  Surgeon: Theodoro Kos, DO;  Location: [place];  Service: Plastics;  Laterality: Bilateral;  . MASTECTOMY W/ SENTINEL NODE BIOPSY  10/18/2011   Procedure: MASTECTOMY WITH SENTINEL LYMPH NODE BIOPSY;  Surgeon: Merrie Roof, MD;  Location: [place];  Service: General;  Laterality: Bilateral;  bilateral mastectomy and left sentinel node biopsy  . NASAL SINUS SURGERY    . POLYPECTOMY  06/20/2019   Procedure: POLYPECTOMY;  Surgeon: Carol Ada, MD;  Location: [place];  Service: Endoscopy;;  . PORT-A-CATH REMOVAL  04/09/2012   Procedure: REMOVAL PORT-A-CATH;  Surgeon: Merrie Roof, MD;  Location: [place];  Service: General;  Laterality: Right;  . PORTACATH PLACEMENT  12/02/2011   Procedure: INSERTION PORT-A-CATH;  Surgeon: Merrie Roof, MD;  Location: [place];  Service: General;  Laterality: Right;  . RIGHT/LEFT HEART CATH AND CORONARY/GRAFT ANGIOGRAPHY N/A 08/22/2017   Procedure: RIGHT/LEFT HEART CATH AND CORONARY/GRAFT ANGIOGRAPHY;  Surgeon: Nelva Bush, MD;  Location: [place] CV LAB;  Service: Cardiovascular;  Laterality: N/A;  . TONSILLECTOMY    . US ECHOCARDIOGRAPHY     at [place] Left 11/26/2013   Procedure: VIDEO BRONCHOSCOPY WITH ENDOBRONCHIAL NAVIGATION;  Surgeon: Collene Gobble, MD;  Location: [facility];  Service: Thoracic;  Laterality: Left;    Current Meds  Medication Sig  . albuterol (PROVENTIL) (2.5 MG/3ML) 0.083% nebulizer solution Inhale 3 mLs into the lungs every 6 (six) hours as needed for wheezing or shortness of breath.  Marland Kitchen aspirin EC 81 MG tablet Take 1 tablet (81 mg total) by mouth daily.  Marland Kitchen atorvastatin (LIPITOR) 80 MG tablet TAKE 1 TABLET BY MOUTH ONCE DAILY AT  6PM (Patient taking differently: Take 80 mg by mouth daily at 6 PM. )  . buPROPion (WELLBUTRIN XL) 300 MG 24 hr tablet Take 300 mg by mouth daily.   . cephALEXin (KEFLEX) 500 MG capsule Take 500 mg by mouth daily.   . Cholecalciferol (VITAMIN D3) 1000 units CAPS Take 1,000 Units by mouth daily.   . fluticasone (FLONASE) 50 MCG/ACT nasal spray Place 2 sprays into both nostrils daily.   . furosemide (LASIX) 20 MG tablet Take 10 mg by mouth 2 (two) times daily.   . Glycopyrrolate-Formoterol (BEVESPI AEROSPHERE) 9-4.8 MCG/ACT AERO Inhale 2 puffs into the lungs daily.  . isosorbide mononitrate (IMDUR) 60  MG 24 hr tablet Take 1 tablet (60 mg total) by mouth 2 (two) times daily.  Marland Kitchen lisinopril (ZESTRIL) 5 MG tablet Take 1 tablet (5 mg total) by mouth daily.  Marland Kitchen loratadine (CLARITIN) 10 MG tablet Take 10 mg by mouth daily.  . metFORMIN (GLUCOPHAGE) 500 MG tablet Take 1 tablet (500 mg total) by mouth 2 (two) times daily. (Patient taking differently: Take 500 mg by mouth every evening. )  . metoprolol tartrate (LOPRESSOR) 25 MG tablet Take 1 tablet (25 mg total) by mouth 2 (two) times daily.  . Multiple Vitamin (MULTI VITAMIN DAILY PO) Take 1 tablet by mouth daily.  . nitroGLYCERIN (NITROSTAT) 0.4 MG SL tablet Place 1 tablet (0.4 mg total) under the tongue every 5 (five) minutes as needed.  Vladimir Faster Glycol-Propyl Glycol (SYSTANE) 0.4-0.3 % SOLN Place 1 drop into both eyes 4 (four)  times daily as needed (for dry eyes).     Allergies: Penicillins, Vicodin [hydrocodone-acetaminophen], Sulfa antibiotics, and Sulfamethoxazole  Social History   Tobacco Use  . Smoking status: Former Smoker    Packs/day: 2.00    Years: 40.00    Pack years: 80.00    Types: Cigarettes    Quit date: 08/25/2006    Years since quitting: 12.8  . Smokeless tobacco: Never Used  Substance Use Topics  . Alcohol use: Yes    Comment: 1-2 times a year  . Drug use: No    Family History  Problem Relation Age of Onset  . Heart disease Mother   . Heart disease Father   . Breast cancer Maternal Aunt   . Ovarian cancer Paternal Aunt   . Breast cancer Maternal Aunt   . Anesthesia problems Neg Hx   . Hypotension Neg Hx   . Malignant hyperthermia Neg Hx   . Pseudochol deficiency Neg Hx     Review of Systems: A 12-system review of systems was performed and was negative except as noted in the HPI.  --------------------------------------------------------------------------------------------------  Physical Exam: BP 130/70 (BP Location: Right Wrist, Patient Position: Sitting, Cuff Size: Large)   Pulse 68   Ht _0  (1.651 m)   Wt 253 lb 8 oz (115 kg)   SpO2 95%   BMI 42.18 kg/m   General: NAD. HEENT: No conjunctival pallor or scleral icterus. Facemask in place. Neck: Supple without lymphadenopathy, thyromegaly, JVD, or HJR. Lungs: Normal work of breathing. Clear to auscultation bilaterally without wheezes or crackles. Heart: Regular rate and rhythm without murmurs, rubs, or gallops.  Unable to assess PMI due to body habitus. Abd: Bowel sounds present. Soft, NT/ND.  Unable to assess HSM due to body habitus. Ext: No lower extremity edema. Skin: Warm and dry without rash.  EKG: Normal sinus rhythm with inferolateral Q waves.  No significant change since 06/09/2019.  Lab Results  Component Value Date   WBC 5.1 06/20/2019   HGB 9.7 (L) 06/20/2019   HCT 30.7 (L) 06/20/2019   MCV 92.5  06/20/2019   PLT 150 06/20/2019    Lab Results  Component Value Date   NA 143 06/20/2019   K 3.8 06/20/2019   CL 113 (H) 06/20/2019   CO2 20 (L) 06/20/2019   BUN 11 06/20/2019   CREATININE 1.15 (H) 06/20/2019   GLUCOSE 104 (H) 06/20/2019   ALT 25 06/19/2019    Lab Results  Component Value Date   CHOL 131 03/30/2019   HDL 42 03/30/2019   LDLCALC 51 03/30/2019   TRIG 192 (H) 03/30/2019   CHOLHDL 3.1  03/30/2019    --------------------------------------------------------------------------------------------------  ASSESSMENT AND PLAN: Coronary artery disease with stable angina: Jamie [name] continues to have significant exertional dyspnea that is likely multifactorial.  It is notable that her anginal equivalent leading up to CABG was shortness of breath.  We had previously discussed left main intervention given atretic LIMA to LAD graft (left coronary artery is still supplied by patent SVG to diagonal; FFR of ostial LMCA was borderline significant in 08/2017).  However, in light of recent diverticular bleed and incomplete hemoglobin recovery, I do not recommend proceeding with any intervention at this time.  I think it is reasonable to continue low-dose aspirin, as tolerated as well as isosorbide mononitrate 60 mg twice daily.  I encouraged Jamie [name] to try to lose weight and exercise, has morbid obesity and deconditioning are likely contributing to her dyspnea.  Aortic stenosis: Mild to moderate by most recent echo, with a mean gradient of 20 mmHg.  I do not believe this is driving the patient's dyspnea.  We will continue clinical follow-up.  Hyperlipidemia: Continue statin therapy.  Hypertension: Blood pressure borderline elevated (goal less 130/80).  No medication change today.  Continue to work on lifestyle modifications.  Acute blood loss anemia: Continue close follow-up with PCP +/- GI.  Avoid escalation of antiplatelet therapy, if possible, as there is certainly risk for  recurrent diverticular bleed.  Follow-up: Return to clinic in 3 months.  Nelva Bush, MD 07/16/2019 2:03 PM

## 2019-07-15 ENCOUNTER — Ambulatory Visit (INDEPENDENT_AMBULATORY_CARE_PROVIDER_SITE_OTHER): Payer: Medicare Other | Admitting: Internal Medicine

## 2019-07-15 ENCOUNTER — Other Ambulatory Visit: Payer: Self-pay

## 2019-07-15 ENCOUNTER — Encounter: Payer: Self-pay | Admitting: Internal Medicine

## 2019-07-15 VITALS — BP 130/70 | HR 68 | Ht 65.0 in | Wt 253.5 lb

## 2019-07-15 DIAGNOSIS — D62 Acute posthemorrhagic anemia: Secondary | ICD-10-CM

## 2019-07-15 DIAGNOSIS — E785 Hyperlipidemia, unspecified: Secondary | ICD-10-CM | POA: Diagnosis not present

## 2019-07-15 DIAGNOSIS — I350 Nonrheumatic aortic (valve) stenosis: Secondary | ICD-10-CM | POA: Diagnosis not present

## 2019-07-15 DIAGNOSIS — I25118 Atherosclerotic heart disease of native coronary artery with other forms of angina pectoris: Secondary | ICD-10-CM | POA: Diagnosis not present

## 2019-07-15 DIAGNOSIS — I10 Essential (primary) hypertension: Secondary | ICD-10-CM | POA: Diagnosis not present

## 2019-07-15 NOTE — Patient Instructions (Signed)
Medication Instructions:  Your physician recommends that you continue on your current medications as directed. Please refer to the Current Medication list given to you today.  *If you need a refill on your cardiac medications before your next appointment, please call your pharmacy*  Lab Work: none If you have labs (blood work) drawn today and your tests are completely normal, you will receive your results only by: . MyChart Message (if you have MyChart) OR . A paper copy in the mail If you have any lab test that is abnormal or we need to change your treatment, we will call you to review the results.  Testing/Procedures: none  Follow-Up: At [facility], you and your health needs are our priority.  As part of our continuing mission to provide you with exceptional heart care, we have created designated Provider Care Teams.  These Care Teams include your primary Cardiologist (physician) and Advanced Practice Providers (APPs -  Physician Assistants and Nurse Practitioners) who all work together to provide you with the care you need, when you need it.  Your next appointment:   3 month(s)  The format for your next appointment:   In Person  Provider:    You may see Christopher End, MD or one of the following Advanced Practice Providers on your designated Care Team:    Christopher Berge, NP  Ryan Dunn, PA-C  Jacquelyn Visser, PA-C 

## 2019-07-16 ENCOUNTER — Encounter: Payer: Self-pay | Admitting: Internal Medicine

## 2019-10-14 ENCOUNTER — Encounter: Payer: Self-pay | Admitting: Internal Medicine

## 2019-10-14 ENCOUNTER — Other Ambulatory Visit: Payer: Self-pay

## 2019-10-14 ENCOUNTER — Ambulatory Visit (INDEPENDENT_AMBULATORY_CARE_PROVIDER_SITE_OTHER): Payer: Medicare Other | Admitting: Internal Medicine

## 2019-10-14 VITALS — BP 110/60 | HR 62 | Ht 65.5 in | Wt 249.4 lb

## 2019-10-14 DIAGNOSIS — I10 Essential (primary) hypertension: Secondary | ICD-10-CM

## 2019-10-14 DIAGNOSIS — E785 Hyperlipidemia, unspecified: Secondary | ICD-10-CM | POA: Diagnosis not present

## 2019-10-14 DIAGNOSIS — I25118 Atherosclerotic heart disease of native coronary artery with other forms of angina pectoris: Secondary | ICD-10-CM

## 2019-10-14 DIAGNOSIS — I350 Nonrheumatic aortic (valve) stenosis: Secondary | ICD-10-CM

## 2019-10-14 NOTE — Patient Instructions (Signed)
Medication Instructions:  Your physician recommends that you continue on your current medications as directed. Please refer to the Current Medication list given to you today.  *If you need a refill on your cardiac medications before your next appointment, please call your pharmacy*   Lab Work: None If you have labs (blood work) drawn today and your tests are completely normal, you will receive your results only by: Marland Kitchen MyChart Message (if you have MyChart) OR . A paper copy in the mail If you have any lab test that is abnormal or we need to change your treatment, we will call you to review the results.   Testing/Procedures: None   Follow-Up: At [facility], you and your health needs are our priority.  As part of our continuing mission to provide you with exceptional heart care, we have created designated Provider Care Teams.  These Care Teams include your primary Cardiologist (physician) and Advanced Practice Providers (APPs -  Physician Assistants and Nurse Practitioners) who all work together to provide you with the care you need, when you need it.  We recommend signing up for the patient portal called "MyChart".  Sign up information is provided on this After Visit Summary.  MyChart is used to connect with patients for Virtual Visits (Telemedicine).  Patients are able to view lab/test results, encounter notes, upcoming appointments, etc.  Non-urgent messages can be sent to your provider as well.   To learn more about what you can do with MyChart, go to [email or web address].    Your next appointment:   6 month(s)  The format for your next appointment:   In Person  Provider:    You may see Nelva Bush, MD or one of the following Advanced Practice Providers on your designated Care Team:    Murray Hodgkins, NP  Christell Micheal, PA-C  Marrianne Mood, PA-C

## 2019-10-14 NOTE — Progress Notes (Signed)
Follow-up Outpatient Visit Date: 10/14/2019  Primary Care Provider: Verdell Carmine., MD [address] [place] [address]  Chief Complaint: Follow-up coronary artery disease  HPI:  Jamie [name] is a 74 y.o. female with history of coronary artery disease status post CABG (6203), diastolic heart failure, hyperlipidemia, carotid artery stenosis, breast cancer status post bilateral mastectomies, diabetes mellitus, COPD, sleep apnea, and obesity, who presents for follow-up of coronary artery disease and chronic diastolic heart failure.  I last saw her in 07/2019, at which time symptoms persisted despite escalation of isosorbide mononitrate to 60 mg twice daily.  Of note, she had been hospitalized a month earlier with rectal bleeding due to diverticulosis.  Shortness of breath and fatigue had worsened in the setting of her acute GI bleed, though they had subsequently returned back to baseline.  Hemoglobin is still lower than baseline.  Labs earlier this month through her PCP were notable for hemoglobin of 12.8.  Today, Jamie [name] happily reports that she is feeling a little bit better.  She is not sure exactly why this is, but she has cut down on sodas and is drinking more water.  She has lost 4 pounds since our last visit.  She denies chest pain, palpitations, lightheadedness, and edema.  She has been trying to walk around the house some but is limited in her mobility due to joint pain.  She denies further rectal bleeding.  --------------------------------------------------------------------------------------------------  Past Medical History:  Diagnosis Date  . Anxiety   . Asthma   . Breast cancer ([place]) 08/2011   lt. breast ca  . CAD (coronary artery disease)    NSTEMI 5/13:  LHC demonstrated oLM 75%, pLAD 80%, RCA occluded, filled by collaterals from the LAD.  Echo 12/10/11: EF 60%.; CABG 12/13/11: LIMA-LAD, SVG-RCA, SVG-diagonal and OM.   Marland Kitchen Carotid artery disease ([place])    a.  Pre-CABG  Dopplers 5/13: Bilateral 40-59%; b.  Carotid US 5/59: RICA 74-16%, LICA 38-45%, normal subclavians bilaterally >> FU 6 months;  c. Carotid US 36/46: RICA 80-32%; LICA 12-24%, normal subclavians >> FU 1 year // d. Carotid US 11/17:  Stable 40-59% bilateral ICA stenosis; F/U 1 year.  Marland Kitchen COPD (chronic obstructive pulmonary disease) ([place])   . DM2 (diabetes mellitus, type 2) ([facility])    metformin  . Gallstones    Symptomatic  . HLD (hyperlipidemia)   . Hx of cardiovascular stress test    Myoview 5/16:  small area of inf-lat ischemia, EF 70%; Low Risk >> try medical Rx  . Hx of echocardiogram    Echo 5/16:  EF 60-65%, no RWMA, Gr 1 DD, Ao sclerosis, mild AS, mean 14 mmHg, MAC  . Hx of migraines   . Hypertension   . Myocardial infarction ([place])   . Obesity   . Shortness of breath   . Sleep apnea    sleep study2-3 yrs ago lost weight afterward but gained back  . UTI (lower urinary tract infection)    freq-bladder implant -removed   Past Surgical History:  Procedure Laterality Date  . ABDOMINAL HYSTERECTOMY  1977  . APPENDECTOMY    . AXILLARY LYMPH NODE DISSECTION  10/18/2011   Procedure: AXILLARY LYMPH NODE DISSECTION;  Surgeon: Merrie Roof, MD;  Location: [place];  Service: General;  Laterality: Left;  . BREAST RECONSTRUCTION  10/18/2011   Procedure: BREAST RECONSTRUCTION;  Surgeon: Theodoro Kos, DO;  Location: [place];  Service: Plastics;  Laterality: Bilateral;   bilateral breast reconstruction with bilateral  tissue expander and placement of flex hd.  Marland Kitchen CARDIAC CATHETERIZATION    . CESAREAN SECTION  04/1976  . CHOLECYSTECTOMY N/A 08/17/2018   Procedure: LAPAROSCOPIC CHOLECYSTECTOMY;  Surgeon: Coralie Keens, MD;  Location: [place];  Service: General;  Laterality: N/A;  . COLONOSCOPY WITH PROPOFOL N/A 06/20/2019   Procedure: COLONOSCOPY WITH PROPOFOL;  Surgeon: Carol Ada, MD;  Location: [place];  Service: Endoscopy;  Laterality: N/A;  . CORONARY ARTERY BYPASS GRAFT  12/13/2011    Procedure: CORONARY ARTERY BYPASS GRAFTING (CABG);  Surgeon: Gaye Pollack, MD;  Location: [place];  Service: Open Heart Surgery;  Laterality: N/A;  Times four using endoscopically harvested left greater saphenous vein and left internal mammary artery. Right greater saphenous vein attempted; not appropriate for vein harvest.  . EYE SURGERY     bilateral cataract extraction   . INTRAVASCULAR PRESSURE WIRE/FFR STUDY N/A 08/22/2017   Procedure: INTRAVASCULAR PRESSURE WIRE/FFR STUDY;  Surgeon: Nelva Bush, MD;  Location: [place];  Service: Cardiovascular;  Laterality: N/A;  . LAPAROSCOPIC GASTRIC BANDING  2010  . LEFT HEART CATHETERIZATION WITH CORONARY ANGIOGRAM N/A 12/11/2011   Procedure: LEFT HEART CATHETERIZATION WITH CORONARY ANGIOGRAM;  Surgeon: Larey Dresser, MD;  Location: [facility];  Service: Cardiovascular;  Laterality: N/A;  . LESION REMOVAL  04/09/2012   Procedure: LESION REMOVAL;  Surgeon: Theodoro Kos, DO;  Location: [place];  Service: Plastics;  Laterality: Bilateral;  . MASTECTOMY W/ SENTINEL NODE BIOPSY  10/18/2011   Procedure: MASTECTOMY WITH SENTINEL LYMPH NODE BIOPSY;  Surgeon: Merrie Roof, MD;  Location: [place];  Service: General;  Laterality: Bilateral;  bilateral mastectomy and left sentinel node biopsy  . NASAL SINUS SURGERY    . POLYPECTOMY  06/20/2019   Procedure: POLYPECTOMY;  Surgeon: Carol Ada, MD;  Location: [place];  Service: Endoscopy;;  . PORT-A-CATH REMOVAL  04/09/2012   Procedure: REMOVAL PORT-A-CATH;  Surgeon: Merrie Roof, MD;  Location: [place];  Service: General;  Laterality: Right;  . PORTACATH PLACEMENT  12/02/2011   Procedure: INSERTION PORT-A-CATH;  Surgeon: Merrie Roof, MD;  Location: [place];  Service: General;  Laterality: Right;  . RIGHT/LEFT HEART CATH AND CORONARY/GRAFT ANGIOGRAPHY N/A 08/22/2017   Procedure: RIGHT/LEFT HEART CATH AND CORONARY/GRAFT ANGIOGRAPHY;  Surgeon: Nelva Bush,  MD;  Location: [facility] [facility];  Service: Cardiovascular;  Laterality: N/A;  . TONSILLECTOMY    . US ECHOCARDIOGRAPHY     at [place] Left 11/26/2013   Procedure: VIDEO BRONCHOSCOPY WITH ENDOBRONCHIAL NAVIGATION;  Surgeon: Collene Gobble, MD;  Location: [place];  Service: Thoracic;  Laterality: Left;     Recent CV Pertinent Labs: Lab Results  Component Value Date   CHOL 131 03/30/2019   HDL 42 03/30/2019   LDLCALC 51 03/30/2019   TRIG 192 (H) 03/30/2019   CHOLHDL 3.1 03/30/2019   CHOLHDL 3 03/10/2012   INR 1.1 06/20/2019   K 3.8 06/20/2019   K 3.9 05/20/2017   MG 2.6 (H) 12/14/2011   BUN 11 06/20/2019   BUN 25 09/05/2017   BUN 14.4 05/20/2017   CREATININE 1.15 (H) 06/20/2019   CREATININE 1.0 05/20/2017    Past medical and surgical history were reviewed and updated in EPIC.  Current Meds  Medication Sig  . albuterol (PROVENTIL) (2.5 MG/3ML) 0.083% nebulizer solution Inhale 3 mLs into the lungs every 6 (six) hours as needed for wheezing or shortness of  breath.  Marland Kitchen aspirin EC 81 MG tablet Take 1 tablet (81 mg total) by mouth daily.  Marland Kitchen atorvastatin (LIPITOR) 80 MG tablet TAKE 1 TABLET BY MOUTH ONCE DAILY AT  6PM (Patient taking differently: Take 80 mg by mouth daily at 6 PM. )  . buPROPion (WELLBUTRIN XL) 300 MG 24 hr tablet Take 300 mg by mouth daily.   . cephALEXin (KEFLEX) 500 MG capsule Take 500 mg by mouth daily.   . Cholecalciferol (VITAMIN D3) 1000 units CAPS Take 1,000 Units by mouth daily.   . fluticasone (FLONASE) 50 MCG/ACT nasal spray Place 2 sprays into both nostrils daily.   . furosemide (LASIX) 20 MG tablet Take 10 mg by mouth 2 (two) times daily.   . Glycopyrrolate-Formoterol (BEVESPI AEROSPHERE) 9-4.8 MCG/ACT AERO Inhale 2 puffs into the lungs daily.  . isosorbide mononitrate (IMDUR) 60 MG 24 hr tablet Take 1 tablet (60 mg total) by mouth 2 (two) times daily.  Marland Kitchen lisinopril (ZESTRIL) 5 MG tablet Take 1  tablet (5 mg total) by mouth daily.  Marland Kitchen loratadine (CLARITIN) 10 MG tablet Take 10 mg by mouth daily.  . metFORMIN (GLUCOPHAGE) 500 MG tablet Take 1 tablet (500 mg total) by mouth 2 (two) times daily. (Patient taking differently: Take 500 mg by mouth every evening. )  . metoprolol tartrate (LOPRESSOR) 25 MG tablet Take 1 tablet (25 mg total) by mouth 2 (two) times daily.  . Multiple Vitamin (MULTI VITAMIN DAILY PO) Take 1 tablet by mouth daily.  . nitroGLYCERIN (NITROSTAT) 0.4 MG SL tablet Place 1 tablet (0.4 mg total) under the tongue every 5 (five) minutes as needed.  Vladimir Faster Glycol-Propyl Glycol (SYSTANE) 0.4-0.3 % SOLN Place 1 drop into both eyes 4 (four) times daily as needed (for dry eyes).     Allergies: Penicillins, Vicodin [hydrocodone-acetaminophen], Sulfa antibiotics, and Sulfamethoxazole  Social History   Tobacco Use  . Smoking status: Former Smoker    Packs/day: 2.00    Years: 40.00    Pack years: 80.00    Types: Cigarettes    Quit date: 08/25/2006    Years since quitting: 13.1  . Smokeless tobacco: Never Used  Substance Use Topics  . Alcohol use: Yes    Comment: 1-2 times a year  . Drug use: No    Family History  Problem Relation Age of Onset  . Heart disease Mother   . Heart disease Father   . Breast cancer Maternal Aunt   . Ovarian cancer Paternal Aunt   . Breast cancer Maternal Aunt   . Anesthesia problems Neg Hx   . Hypotension Neg Hx   . Malignant hyperthermia Neg Hx   . Pseudochol deficiency Neg Hx     Review of Systems: A 12-system review of systems was performed and was negative except as noted in the HPI.  --------------------------------------------------------------------------------------------------  Physical Exam: BP 110/60 (BP Location: Left Arm, Patient Position: Sitting, Cuff Size: Large)   Pulse 62   Ht 5' 5.5" (1.664 m)   Wt 249 lb 6 oz (113.1 kg)   SpO2 98%   BMI 40.87 kg/m   General: NAD. Neck: No JVD or HJR, though body  habitus limits evaluation. Lungs: Clear to auscultation bilaterally without wheezes or crackles. Heart: Distant heart sounds.  Regular rate and rhythm with 1/6 systolic murmur.  No rubs or gallops. Abdomen: Obese and soft.  No tenderness. Extremities: No lower extremity edema.  EKG: Normal sinus rhythm without abnormality.  Lab Results  Component Value Date  WBC 5.1 06/20/2019   HGB 9.7 (L) 06/20/2019   HCT 30.7 (L) 06/20/2019   MCV 92.5 06/20/2019   PLT 150 06/20/2019    Lab Results  Component Value Date   NA 143 06/20/2019   K 3.8 06/20/2019   CL 113 (H) 06/20/2019   CO2 20 (L) 06/20/2019   BUN 11 06/20/2019   CREATININE 1.15 (H) 06/20/2019   GLUCOSE 104 (H) 06/20/2019   ALT 25 06/19/2019    Lab Results  Component Value Date   CHOL 131 03/30/2019   HDL 42 03/30/2019   LDLCALC 51 03/30/2019   TRIG 192 (H) 03/30/2019   CHOLHDL 3.1 03/30/2019    --------------------------------------------------------------------------------------------------  ASSESSMENT AND PLAN: Coronary artery disease with stable angina: Jamie [name] reports improved shortness of breath since our last visit, with dyspnea notably being her anginal equivalent.  I suspect that normalization of her hemoglobin as well as some lifestyle modifications have contributed to this.  Given that she is able to perform her ADLs without significant limitation from her shortness of breath, we have agreed to defer medication changes as well as repeat catheterization and intervention to the LMCA.  Aortic stenosis: Chronic dyspnea has actually improved, as noted above.  Most recent evaluation of left ear revealed continued mild to moderate narrowing.  We will continue with clinical follow-up.  Hyperlipidemia: LDL at goal (63 on 10/05/2019).  Continue atorvastatin 80 mg daily.    Hypertension: Blood pressure well controlled today.  No medication changes at this time.  Morbid obesity: BMI remains greater than 40 with  multiple comorbidities.  I encouraged Jamie [name] to lose weight through diet and exercise.  Follow-up: Return to clinic in 6 months.  Nelva Bush, MD 10/14/2019 9:35 PM

## 2019-12-15 ENCOUNTER — Other Ambulatory Visit: Payer: Self-pay | Admitting: Internal Medicine

## 2019-12-28 ENCOUNTER — Ambulatory Visit
Admission: EM | Admit: 2019-12-28 | Discharge: 2019-12-28 | Disposition: A | Discharge status: Home / Self Care | Payer: Medicare Other | Attending: Physician Assistant | Admitting: Physician Assistant

## 2019-12-28 DIAGNOSIS — S81812A Laceration without foreign body, left lower leg, initial encounter: Secondary | ICD-10-CM | POA: Diagnosis not present

## 2019-12-28 NOTE — ED Provider Notes (Signed)
[facility]    CSN: [id number] Arrival date & time: 12/28/19  0805      History   Chief Complaint Chief Complaint  Patient presents with  . Laceration    HPI Jamie [name] is a 74 y.o. female.   74 year old female comes in for LLE laceration shortly prior to arrival. States dog scratched the leg, causing laceration. Bleeding controlled with pressure. Applied pressure dressing and came in for evaluation. Tetanus up to date.      Past Medical History:  Diagnosis Date  . Anxiety   . Asthma   . Breast cancer ([place]) 08/2011   lt. breast ca  . CAD (coronary artery disease)    NSTEMI 5/13:  LHC demonstrated oLM 75%, pLAD 80%, RCA occluded, filled by collaterals from the LAD.  Echo 12/10/11: EF 60%.; CABG 12/13/11: LIMA-LAD, SVG-RCA, SVG-diagonal and OM.   Marland Kitchen Carotid artery disease ([place])    a.  Pre-CABG Dopplers 5/13: Bilateral 40-59%; b.  Carotid US 4/91: RICA 79-15%, LICA 05-69%, normal subclavians bilaterally >> FU 6 months;  c. Carotid US 79/48: RICA 01-65%; LICA 53-74%, normal subclavians >> FU 1 year // d. Carotid US 11/17:  Stable 40-59% bilateral ICA stenosis; F/U 1 year.  Marland Kitchen COPD (chronic obstructive pulmonary disease) ([place])   . DM2 (diabetes mellitus, type 2) (H[facility])    metformin  . Gallstones    Symptomatic  . HLD (hyperlipidemia)   . Hx of cardiovascular stress test    Myoview 5/16:  small area of inf-lat ischemia, EF 70%; Low Risk >> try medical Rx  . Hx of echocardiogram    Echo 5/16:  EF 60-65%, no RWMA, Gr 1 DD, Ao sclerosis, mild AS, mean 14 mmHg, MAC  . Hx of migraines   . Hypertension   . Myocardial infarction ([place])   . Obesity   . Shortness of breath   . Sleep apnea    sleep study2-3 yrs ago lost weight afterward but gained back  . UTI (lower urinary tract infection)    freq-bladder implant -removed    Patient Active Problem List   Diagnosis Date Noted  . Acute GI bleeding 06/19/2019  . Acute blood loss anemia 06/19/2019  . Symptomatic  cholelithiasis 06/22/2018  . Shortness of breath 07/18/2017  . Chronic diastolic heart failure ([place]) 07/18/2017  . Essential hypertension 07/18/2017  . 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 Osteopenia 02/20/2015  . Aortic valve stenosis 01/26/2015  . Fatty liver disease, nonalcoholic 82/70/7867  . Nodule of left lung 11/26/2013  . Diabetes mellitus, type 2 ([place]) 08/16/2013  . OSA (obstructive sleep apnea) 01/28/2013  . Lymphedema of arm 05/07/2012  . Acquired absence of bilateral breasts and nipples 04/10/2012  . Carotid stenosis 03/11/2012  . Preoperative evaluation of a medical condition to rule out surgical contraindications (TAR required) 03/11/2012  . Constipation 12/12/2011  . CHF (congestive heart failure) ([place]) 12/10/2011  . COPD (chronic obstructive pulmonary disease) ([place]) 12/10/2011  . Hyperlipidemia LDL goal <70 12/10/2011  . Morbid obesity ([place]) 12/10/2011  . Coronary artery disease of native heart with stable angina pectoris ([place]) 12/10/2011    Past Surgical History:  Procedure Laterality Date  . ABDOMINAL HYSTERECTOMY  1977  . APPENDECTOMY    . AXILLARY LYMPH NODE DISSECTION  10/18/2011   Procedure: AXILLARY LYMPH NODE DISSECTION;  Surgeon: Merrie Roof, MD;  Location: [place];  Service: General;  Laterality: Left;  .  BREAST RECONSTRUCTION  10/18/2011   Procedure: BREAST RECONSTRUCTION;  Surgeon: Theodoro Kos, DO;  Location: [place];  Service: Plastics;  Laterality: Bilateral;   bilateral breast reconstruction with bilateral tissue expander and placement of flex hd.  Marland Kitchen CARDIAC CATHETERIZATION    . CESAREAN SECTION  04/1976  . CHOLECYSTECTOMY N/A 08/17/2018   Procedure: LAPAROSCOPIC CHOLECYSTECTOMY;  Surgeon: Coralie Keens, MD;  Location: [place];  Service: General;  Laterality: N/A;  . COLONOSCOPY WITH PROPOFOL N/A 06/20/2019   Procedure: COLONOSCOPY WITH PROPOFOL;  Surgeon: Carol Ada, MD;   Location: [place];  Service: Endoscopy;  Laterality: N/A;  . CORONARY ARTERY BYPASS GRAFT  12/13/2011   Procedure: CORONARY ARTERY BYPASS GRAFTING (CABG);  Surgeon: Gaye Pollack, MD;  Location: [place];  Service: Open Heart Surgery;  Laterality: N/A;  Times four using endoscopically harvested left greater saphenous vein and left internal mammary artery. Right greater saphenous vein attempted; not appropriate for vein harvest.  . EYE SURGERY     bilateral cataract extraction   . INTRAVASCULAR PRESSURE WIRE/FFR STUDY N/A 08/22/2017   Procedure: INTRAVASCULAR PRESSURE WIRE/FFR STUDY;  Surgeon: Nelva Bush, MD;  Location: [facility];  Service: Cardiovascular;  Laterality: N/A;  . LAPAROSCOPIC GASTRIC BANDING  2010  . LEFT HEART CATHETERIZATION WITH CORONARY ANGIOGRAM N/A 12/11/2011   Procedure: LEFT HEART CATHETERIZATION WITH CORONARY ANGIOGRAM;  Surgeon: Larey Dresser, MD;  Location: [facility] CATH [facility];  Service: Cardiovascular;  Laterality: N/A;  . LESION REMOVAL  04/09/2012   Procedure: LESION REMOVAL;  Surgeon: Theodoro Kos, DO;  Location: [facility];  Service: Plastics;  Laterality: Bilateral;  . MASTECTOMY W/ SENTINEL NODE BIOPSY  10/18/2011   Procedure: MASTECTOMY WITH SENTINEL LYMPH NODE BIOPSY;  Surgeon: Merrie Roof, MD;  Location: [place];  Service: General;  Laterality: Bilateral;  bilateral mastectomy and left sentinel node biopsy  . NASAL SINUS SURGERY    . POLYPECTOMY  06/20/2019   Procedure: POLYPECTOMY;  Surgeon: Carol Ada, MD;  Location: [facility];  Service: Endoscopy;;  . PORT-A-CATH REMOVAL  04/09/2012   Procedure: REMOVAL PORT-A-CATH;  Surgeon: Merrie Roof, MD;  Location: [place];  Service: General;  Laterality: Right;  . PORTACATH PLACEMENT  12/02/2011   Procedure: INSERTION PORT-A-CATH;  Surgeon: Merrie Roof, MD;  Location: [place];  Service: General;  Laterality: Right;  . RIGHT/LEFT HEART CATH AND CORONARY/GRAFT ANGIOGRAPHY  N/A 08/22/2017   Procedure: RIGHT/LEFT HEART CATH AND CORONARY/GRAFT ANGIOGRAPHY;  Surgeon: Nelva Bush, MD;  Location: [facility];  Service: Cardiovascular;  Laterality: N/A;  . TONSILLECTOMY    . US ECHOCARDIOGRAPHY     at [place] NAVIGATION Left 11/26/2013   Procedure: VIDEO BRONCHOSCOPY WITH ENDOBRONCHIAL NAVIGATION;  Surgeon: Collene Gobble, MD;  Location: [facility];  Service: Thoracic;  Laterality: Left;    OB History   No obstetric history on file.      Home Medications    Prior to Admission medications   Medication Sig Start Date End Date Taking? Authorizing Provider  albuterol (PROVENTIL) (2.5 MG/3ML) 0.083% nebulizer solution Inhale 3 mLs into the lungs every 6 (six) hours as needed for wheezing or shortness of breath. 02/19/18   [provider]  aspirin EC 81 MG tablet Take 1 tablet (81 mg total) by mouth daily. 04/26/16   Richardson Dopp T, PA-C  atorvastatin (LIPITOR) 80 MG tablet Take 1 tablet (80 mg total) by mouth daily at  6 PM. 12/15/19   End, Harrell Gave, MD  buPROPion (WELLBUTRIN XL) 300 MG 24 hr tablet Take 300 mg by mouth daily.  06/20/17   [provider]  cephALEXin (KEFLEX) 500 MG capsule Take 500 mg by mouth daily.  03/05/17   [provider]  Cholecalciferol (VITAMIN D3) 1000 units CAPS Take 1,000 Units by mouth daily.     [provider]  fluticasone (FLONASE) 50 MCG/ACT nasal spray Place 2 sprays into both nostrils daily.  08/14/13   [provider]  furosemide (LASIX) 20 MG tablet Take 10 mg by mouth 2 (two) times daily.  06/07/19   [provider]  Glycopyrrolate-Formoterol (BEVESPI AEROSPHERE) 9-4.8 MCG/ACT AERO Inhale 2 puffs into the lungs daily.    [provider]  isosorbide mononitrate (IMDUR) 60 MG 24 hr tablet Take 1 tablet (60 mg total) by mouth 2 (two) times daily. 06/09/19 10/14/19  End, Harrell Gave, MD  lisinopril (ZESTRIL) 5 MG tablet Take 1  tablet (5 mg total) by mouth daily. 05/05/19 10/14/19  End, Harrell Gave, MD  loratadine (CLARITIN) 10 MG tablet Take 10 mg by mouth daily.    [provider]  metFORMIN (GLUCOPHAGE) 500 MG tablet Take 1 tablet (500 mg total) by mouth 2 (two) times daily. Patient taking differently: Take 500 mg by mouth every evening.  08/25/17   End, Harrell Gave, MD  metoprolol tartrate (LOPRESSOR) 25 MG tablet Take 1 tablet (25 mg total) by mouth 2 (two) times daily. 01/08/19   End, Harrell Gave, MD  Multiple Vitamin (MULTI VITAMIN DAILY PO) Take 1 tablet by mouth daily.    [provider]  nitroGLYCERIN (NITROSTAT) 0.4 MG SL tablet Place 1 tablet (0.4 mg total) under the tongue every 5 (five) minutes as needed. 06/22/18   End, Harrell Gave, MD  Polyethyl Glycol-Propyl Glycol (SYSTANE) 0.4-0.3 % SOLN Place 1 drop into both eyes 4 (four) times daily as needed (for dry eyes).     [provider]    Family History Family History  Problem Relation Age of Onset  . Heart disease Mother   . Heart disease Father   . Breast cancer Maternal Aunt   . Ovarian cancer Paternal Aunt   . Breast cancer Maternal Aunt   . Anesthesia problems Neg Hx   . Hypotension Neg Hx   . Malignant hyperthermia Neg Hx   . Pseudochol deficiency Neg Hx     Social History Social History   Tobacco Use  . Smoking status: Former Smoker    Packs/day: 2.00    Years: 40.00    Pack years: 80.00    Types: Cigarettes    Quit date: 08/25/2006    Years since quitting: 13.3  . Smokeless tobacco: Never Used  Substance Use Topics  . Alcohol use: Yes    Comment: 1-2 times a year  . Drug use: No     Allergies   Penicillins, Vicodin [hydrocodone-acetaminophen], Sulfa antibiotics, and Sulfamethoxazole   Review of Systems Review of Systems  Reason unable to perform ROS: See HPI as above.     Physical Exam Triage Vital Signs ED Triage Vitals  Enc Vitals Group     BP 12/28/19 0812 136/78     Pulse Rate 12/28/19  0812 96     Resp 12/28/19 0812 18     Temp 12/28/19 0812 97.6 F (36.4 C)     Temp Source 12/28/19 0812 Oral     SpO2 12/28/19 0812 96 %     Weight --  Height --      Head Circumference --      Peak Flow --      Pain Score 12/28/19 0813 7     Pain Loc --      Pain Edu? --      Excl. in [place]? --    No data found.  Updated Vital Signs BP 136/78 (BP Location: Left Arm)   Pulse 96   Temp 97.6 F (36.4 C) (Oral)   Resp 18   SpO2 96%   Visual Acuity Right Eye Distance:   Left Eye Distance:   Bilateral Distance:    Right Eye Near:   Left Eye Near:    Bilateral Near:     Physical Exam Constitutional:      General: She is not in acute distress.    Appearance: Normal appearance. She is well-developed. She is not toxic-appearing or diaphoretic.  HENT:     Head: Normocephalic and atraumatic.  Eyes:     Conjunctiva/sclera: Conjunctivae normal.     Pupils: Pupils are equal, round, and reactive to light.  Pulmonary:     Effort: Pulmonary effort is normal. No respiratory distress.     Comments: Speaking in full sentences without difficulty Musculoskeletal:     Cervical back: Normal range of motion and neck supple.     Comments: 7cm linear laceration to the left mid anterior shin with contusion. Bleeding controlled with pressure. Sensation intact. Pedal pulse 2+  Skin:    General: Skin is warm and dry.  Neurological:     Mental Status: She is alert and oriented to person, place, and time.      UC Treatments / Results  Labs (all labs ordered are listed, but only abnormal results are displayed) Labs Reviewed - No data to display  EKG   Radiology No results found.  Procedures Laceration Repair  Date/Time: 12/28/2019 8:52 AM Performed by: Ok Edwards, PA-C Authorized by: Ok Edwards, PA-C   Consent:    Consent obtained:  Verbal   Consent given by:  Patient   Risks discussed:  Infection, pain, poor cosmetic result and poor wound healing   Alternatives discussed:   No treatment and referral Anesthesia (see MAR for exact dosages):    Anesthesia method:  Local infiltration   Local anesthetic:  Lidocaine 2% WITH epi Laceration details:    Location:  Leg   Leg location:  L lower leg   Length (cm):  7   Depth (mm):  2 Repair type:    Repair type:  Simple Pre-procedure details:    Preparation:  Patient was prepped and draped in usual sterile fashion Exploration:    Hemostasis achieved with:  Direct pressure and epinephrine   Wound exploration: entire depth of wound probed and visualized     Contaminated: no   Treatment:    Area cleansed with:  Hibiclens   Amount of cleaning:  Extensive   Irrigation solution:  Sterile water   Irrigation method:  Pressure wash   Visualized foreign bodies/material removed: no   Skin repair:    Repair method:  Sutures   Suture size:  4-0   Suture material:  Prolene   Suture technique:  Simple interrupted   Number of sutures:  9 Approximation:    Approximation:  Close Post-procedure details:    Dressing:  Antibiotic ointment and bulky dressing   Patient tolerance of procedure:  Tolerated well, no immediate complications   (including critical care time)  Medications Ordered in  UC Medications - No data to display  Initial Impression / Assessment and Plan / UC Course  I have reviewed the triage vital signs and the nursing notes.  Pertinent labs & imaging results that were available during my care of the patient were reviewed by me and considered in my medical decision making (see chart for details).    Discussed laceration repair options including sutures vs dermaclip vs steristrip. Risks and benefits discussed. Will proceed with sutures.  Patient tolerated procedure well. 9 sutures placed. Wound care instructions given. Return precautions given. Otherwise, follow up in 10 days for suture removal. Patient expresses understanding and agrees to plan.   Final Clinical Impressions(s) / UC Diagnoses   Final  diagnoses:  Laceration of left lower extremity, initial encounter   ED Prescriptions    None     PDMP not reviewed this encounter.   Ok Edwards, PA-C 12/28/19 [phone]

## 2019-12-28 NOTE — ED Triage Notes (Signed)
Pt states her dog scratch her lt lower leg when jumping on her this am. Laceration noted to LLE with active bleeding.

## 2019-12-28 NOTE — Discharge Instructions (Signed)
9sutures placed. You can remove current dressing in 24 hours. Keep wound clean and dry. You can clean gently with soap and water. Do not soak area in water. Monitor for spreading redness, increased warmth, increased swelling, fever, follow up for reevaluation needed. Otherwise follow up in 10 days for suture removal.

## 2020-01-07 ENCOUNTER — Ambulatory Visit
Admission: EM | Admit: 2020-01-07 | Discharge: 2020-01-07 | Disposition: A | Discharge status: Home / Self Care | Payer: Medicare Other | Attending: Emergency Medicine | Admitting: Emergency Medicine

## 2020-01-07 DIAGNOSIS — Z4802 Encounter for removal of sutures: Secondary | ICD-10-CM

## 2020-01-07 NOTE — ED Triage Notes (Signed)
Removed 9 sutures from LLE. Redness noted with no heat, no drainage. Wound reviewed by PA, no sx's of infection. Pt education on s/sx's of infection.

## 2020-02-26 ENCOUNTER — Other Ambulatory Visit: Payer: Self-pay | Admitting: Internal Medicine

## 2020-03-22 ENCOUNTER — Other Ambulatory Visit: Payer: Self-pay | Admitting: Internal Medicine

## 2020-04-19 ENCOUNTER — Encounter: Payer: Self-pay | Admitting: Internal Medicine

## 2020-04-19 ENCOUNTER — Other Ambulatory Visit: Payer: Self-pay

## 2020-04-19 ENCOUNTER — Ambulatory Visit (INDEPENDENT_AMBULATORY_CARE_PROVIDER_SITE_OTHER): Payer: Medicare Other | Admitting: Internal Medicine

## 2020-04-19 VITALS — BP 110/60 | HR 62 | Ht 65.0 in | Wt 252.0 lb

## 2020-04-19 DIAGNOSIS — I10 Essential (primary) hypertension: Secondary | ICD-10-CM

## 2020-04-19 DIAGNOSIS — I350 Nonrheumatic aortic (valve) stenosis: Secondary | ICD-10-CM

## 2020-04-19 DIAGNOSIS — I5032 Chronic diastolic (congestive) heart failure: Secondary | ICD-10-CM | POA: Diagnosis not present

## 2020-04-19 DIAGNOSIS — I25118 Atherosclerotic heart disease of native coronary artery with other forms of angina pectoris: Secondary | ICD-10-CM

## 2020-04-19 DIAGNOSIS — E785 Hyperlipidemia, unspecified: Secondary | ICD-10-CM

## 2020-04-19 MED ORDER — FUROSEMIDE 20 MG PO TABS: 20.0000 mg | ORAL_TABLET | Freq: Every day | ORAL | 5 refills | Status: DC | PRN

## 2020-04-19 NOTE — Patient Instructions (Signed)
Medication Instructions:  Your physician has recommended you make the following change in your medication:  1- CHANGE Furosemide to 20 mg by mouth once a day as needed for edema (swelling).  *If you need a refill on your cardiac medications before your next appointment, please call your pharmacy*  Lab Work: none If you have labs (blood work) drawn today and your tests are completely normal, you will receive your results only by: Marland Kitchen MyChart Message (if you have MyChart) OR . A paper copy in the mail If you have any lab test that is abnormal or we need to change your treatment, we will call you to review the results.  Testing/Procedures:  Echocardiogram in 6 months Your physician has requested that you have an echocardiogram. Echocardiography is a painless test that uses sound waves to create images of your heart. It provides your doctor with information about the size and shape of your heart and how well your heart's chambers and valves are working. This procedure takes approximately one hour. There are no restrictions for this procedure. You may get an IV, if needed, to receive an ultrasound enhancing agent through to better visualize your heart.    Follow-Up: At [facility], you and your health needs are our priority.  As part of our continuing mission to provide you with exceptional heart care, we have created designated Provider Care Teams.  These Care Teams include your primary Cardiologist (physician) and Advanced Practice Providers (APPs -  Physician Assistants and Nurse Practitioners) who all work together to provide you with the care you need, when you need it.  We recommend signing up for the patient portal called "MyChart".  Sign up information is provided on this After Visit Summary.  MyChart is used to connect with patients for Virtual Visits (Telemedicine).  Patients are able to view lab/test results, encounter notes, upcoming appointments, etc.  Non-urgent messages can be sent  to your provider as well.   To learn more about what you can do with MyChart, go to [email or web address].    Your next appointment:   6 month(s) after echo.  The format for your next appointment:   In Person  Provider:    You may see Nelva Bush, MD or one of the following Advanced Practice Providers on your designated Care Team:    Murray Hodgkins, NP  Christell Mallie, PA-C  Marrianne Mood, PA-C

## 2020-04-19 NOTE — Progress Notes (Signed)
Follow-up Outpatient Visit Date: 04/19/2020  Primary Care Provider: Verdell Carmine., MD [address] [place] [address]  Chief Complaint: Follow-up CAD  HPI:  Jamie [name] is a 74 y.o. female with history of coronary artery disease status post CABG (1308), diastolic heart failure, hyperlipidemia, carotid artery stenosis, breast cancer status post bilateral mastectomies, diabetes mellitus, COPD, sleep apnea, and obesity, who presents for follow-up of coronary artery disease and chronic HFpEF. I last saw Jamie [name] in March, at which time she reported feeling a little bit better with improvement in her shortness of breath. We agreed to defer additional procedures as well as medication changes.  Today, Jamie [name] reports that she feels about the same as 6 months ago.  She still has exertional dyspnea with modest activity but feels like she is able to do more than 6-12 months ago.  She denies chest pain, palpitations, lightheadedness, and edema.  She notes nausea most mornings after eating and taking her medications.  On further questioning, she recently started taking a dietary supplement to help her lose weight, which temporally correlates with onset of AM nausea.  She does not have any abdominal pain.  --------------------------------------------------------------------------------------------------  Past Medical History:  Diagnosis Date  . Anxiety   . Asthma   . Breast cancer ([facility]) 08/2011   lt. breast ca  . CAD (coronary artery disease)    NSTEMI 5/13:  LHC demonstrated oLM 75%, pLAD 80%, RCA occluded, filled by collaterals from the LAD.  Echo 12/10/11: EF 60%.; CABG 12/13/11: LIMA-LAD, SVG-RCA, SVG-diagonal and OM.   Marland Kitchen Carotid artery disease ([facility])    a.  Pre-CABG Dopplers 5/13: Bilateral 40-59%; b.  Carotid US 6/57: RICA 84-69%, LICA 62-95%, normal subclavians bilaterally >> FU 6 months;  c. Carotid US 28/41: RICA 32-44%; LICA 01-02%, normal subclavians >> FU 1 year // d. Carotid US  11/17:  Stable 40-59% bilateral ICA stenosis; F/U 1 year.  Marland Kitchen COPD (chronic obstructive pulmonary disease) ([facility])   . DM2 (diabetes mellitus, type 2) ([facility])    metformin  . Gallstones    Symptomatic  . HLD (hyperlipidemia)   . Hx of cardiovascular stress test    Myoview 5/16:  small area of inf-lat ischemia, EF 70%; Low Risk >> try medical Rx  . Hx of echocardiogram    Echo 5/16:  EF 60-65%, no RWMA, Gr 1 DD, Ao sclerosis, mild AS, mean 14 mmHg, MAC  . Hx of migraines   . Hypertension   . Myocardial infarction ([facility])   . Obesity   . Shortness of breath   . Sleep apnea    sleep study2-3 yrs ago lost weight afterward but gained back  . UTI (lower urinary tract infection)    freq-bladder implant -removed   Past Surgical History:  Procedure Laterality Date  . ABDOMINAL HYSTERECTOMY  1977  . APPENDECTOMY    . AXILLARY LYMPH NODE DISSECTION  10/18/2011   Procedure: AXILLARY LYMPH NODE DISSECTION;  Surgeon: Merrie Roof, MD;  Location: [facility];  Service: General;  Laterality: Left;  . BREAST RECONSTRUCTION  10/18/2011   Procedure: BREAST RECONSTRUCTION;  Surgeon: Theodoro Kos, DO;  Location: [facility];  Service: Plastics;  Laterality: Bilateral;   bilateral breast reconstruction with bilateral tissue expander and placement of flex hd.  Marland Kitchen CARDIAC CATHETERIZATION    . CESAREAN SECTION  04/1976  . CHOLECYSTECTOMY N/A 08/17/2018   Procedure: LAPAROSCOPIC CHOLECYSTECTOMY;  Surgeon: Coralie Keens, MD;  Location: [facility];  Service: General;  Laterality:  N/A;  . COLONOSCOPY WITH PROPOFOL N/A 06/20/2019   Procedure: COLONOSCOPY WITH PROPOFOL;  Surgeon: Carol Ada, MD;  Location: [facility];  Service: Endoscopy;  Laterality: N/A;  . CORONARY ARTERY BYPASS GRAFT  12/13/2011   Procedure: CORONARY ARTERY BYPASS GRAFTING (CABG);  Surgeon: Gaye Pollack, MD;  Location: [facility];  Service: Open Heart Surgery;  Laterality: N/A;  Times four using endoscopically harvested left greater saphenous vein and left  internal mammary artery. Right greater saphenous vein attempted; not appropriate for vein harvest.  . EYE SURGERY     bilateral cataract extraction   . INTRAVASCULAR PRESSURE WIRE/FFR STUDY N/A 08/22/2017   Procedure: INTRAVASCULAR PRESSURE WIRE/FFR STUDY;  Surgeon: Nelva Bush, MD;  Location: [facility];  Service: Cardiovascular;  Laterality: N/A;  . LAPAROSCOPIC GASTRIC BANDING  2010  . LEFT HEART CATHETERIZATION WITH CORONARY ANGIOGRAM N/A 12/11/2011   Procedure: LEFT HEART CATHETERIZATION WITH CORONARY ANGIOGRAM;  Surgeon: Larey Dresser, MD;  Location: [facility] CATH [facility];  Service: Cardiovascular;  Laterality: N/A;  . LESION REMOVAL  04/09/2012   Procedure: LESION REMOVAL;  Surgeon: Theodoro Kos, DO;  Location: [facility];  Service: Plastics;  Laterality: Bilateral;  . MASTECTOMY W/ SENTINEL NODE BIOPSY  10/18/2011   Procedure: MASTECTOMY WITH SENTINEL LYMPH NODE BIOPSY;  Surgeon: Merrie Roof, MD;  Location: [facility];  Service: General;  Laterality: Bilateral;  bilateral mastectomy and left sentinel node biopsy  . NASAL SINUS SURGERY    . POLYPECTOMY  06/20/2019   Procedure: POLYPECTOMY;  Surgeon: Carol Ada, MD;  Location: [facility];  Service: Endoscopy;;  . PORT-A-CATH REMOVAL  04/09/2012   Procedure: REMOVAL PORT-A-CATH;  Surgeon: Merrie Roof, MD;  Location: [facility];  Service: General;  Laterality: Right;  . PORTACATH PLACEMENT  12/02/2011   Procedure: INSERTION PORT-A-CATH;  Surgeon: Merrie Roof, MD;  Location: [facility];  Service: General;  Laterality: Right;  . RIGHT/LEFT HEART CATH AND CORONARY/GRAFT ANGIOGRAPHY N/A 08/22/2017   Procedure: RIGHT/LEFT HEART CATH AND CORONARY/GRAFT ANGIOGRAPHY;  Surgeon: Nelva Bush, MD;  Location: [facility];  Service: Cardiovascular;  Laterality: N/A;  . TONSILLECTOMY    . US ECHOCARDIOGRAPHY     at [facility] Left 11/26/2013   Procedure:  VIDEO BRONCHOSCOPY WITH ENDOBRONCHIAL NAVIGATION;  Surgeon: Collene Gobble, MD;  Location: [facility];  Service: Thoracic;  Laterality: Left;    Current Meds  Medication Sig  . albuterol (PROVENTIL) (2.5 MG/3ML) 0.083% nebulizer solution Inhale 3 mLs into the lungs every 6 (six) hours as needed for wheezing or shortness of breath.  Marland Kitchen aspirin EC 81 MG tablet Take 1 tablet (81 mg total) by mouth daily.  Marland Kitchen atorvastatin (LIPITOR) 80 MG tablet Take 1 tablet (80 mg total) by mouth daily at 6 PM.  . buPROPion (WELLBUTRIN XL) 300 MG 24 hr tablet Take 300 mg by mouth daily.   . cephALEXin (KEFLEX) 500 MG capsule Take 500 mg by mouth daily.   . Cholecalciferol (VITAMIN D3) 1000 units CAPS Take 1,000 Units by mouth daily.   . fluticasone (FLONASE) 50 MCG/ACT nasal spray Place 2 sprays into both nostrils daily.   . furosemide (LASIX) 20 MG tablet Take 10 mg by mouth 2 (two) times daily.   . Glycopyrrolate-Formoterol (BEVESPI AEROSPHERE) 9-4.8 MCG/ACT AERO Inhale 2 puffs into the lungs daily.  . isosorbide mononitrate (IMDUR) 60 MG 24 hr tablet Take 1 tablet by mouth twice daily  .  loratadine (CLARITIN) 10 MG tablet Take 10 mg by mouth daily.  . metFORMIN (GLUCOPHAGE) 500 MG tablet Take 1 tablet (500 mg total) by mouth 2 (two) times daily. (Patient taking differently: Take 500 mg by mouth every evening. )  . metoprolol tartrate (LOPRESSOR) 25 MG tablet Take 1 tablet by mouth twice daily  . Multiple Vitamin (MULTI VITAMIN DAILY PO) Take 1 tablet by mouth daily.  . nitroGLYCERIN (NITROSTAT) 0.4 MG SL tablet Place 1 tablet (0.4 mg total) under the tongue every 5 (five) minutes as needed.  Vladimir Faster Glycol-Propyl Glycol (SYSTANE) 0.4-0.3 % SOLN Place 1 drop into both eyes 4 (four) times daily as needed (for dry eyes).     Allergies: Penicillins, Vicodin [hydrocodone-acetaminophen], Sulfa antibiotics, and Sulfamethoxazole  Social History   Tobacco Use  . Smoking status: Former Smoker    Packs/day: 2.00     Years: 40.00    Pack years: 80.00    Types: Cigarettes    Quit date: 08/25/2006    Years since quitting: 13.6  . Smokeless tobacco: Never Used  Vaping Use  . Vaping Use: Never used  Substance Use Topics  . Alcohol use: Yes    Comment: 1-2 times a year  . Drug use: No    Family History  Problem Relation Age of Onset  . Heart disease Mother   . Heart disease Father   . Breast cancer Maternal Aunt   . Ovarian cancer Paternal Aunt   . Breast cancer Maternal Aunt   . Anesthesia problems Neg Hx   . Hypotension Neg Hx   . Malignant hyperthermia Neg Hx   . Pseudochol deficiency Neg Hx     Review of Systems: A 12-system review of systems was performed and was negative except as noted in the HPI.  --------------------------------------------------------------------------------------------------  Physical Exam: BP 110/60 (BP Location: Right Arm, Patient Position: Sitting, Cuff Size: Large)   Pulse 62   Ht _0  (1.651 m)   Wt 252 lb (114.3 kg)   BMI 41.93 kg/m   General:  NAD. HEENT: No conjunctival pallor or scleral icterus. Facemask in place. Neck: Supple without lymphadenopathy, thyromegaly, JVD, or HJR. Lungs: Normal work of breathing. Clear to auscultation bilaterally without wheezes or crackles. Heart: Regular rate and rhythm with 2/6 systolic murmur.  No rubs or gallops. Abd: Bowel sounds present. Soft, NT/ND without. Ext: No lower extremity edema.  EKG:  NSR with inferior Q waves, more pronounced compared with Oct 27, 2019 but similar to 07/15/2019.  Lab Results  Component Value Date   WBC 5.1 06/20/2019   HGB 9.7 (L) 06/20/2019   HCT 30.7 (L) 06/20/2019   MCV 92.5 06/20/2019   PLT 150 06/20/2019    Lab Results  Component Value Date   NA 143 06/20/2019   K 3.8 06/20/2019   CL 113 (H) 06/20/2019   CO2 20 (L) 06/20/2019   BUN 11 06/20/2019   CREATININE 1.15 (H) 06/20/2019   GLUCOSE 104 (H) 06/20/2019   ALT 25 06/19/2019    Lab Results  Component Value  Date   CHOL 131 03/30/2019   HDL 42 03/30/2019   LDLCALC 51 03/30/2019   TRIG 192 (H) 03/30/2019   CHOLHDL 3.1 03/30/2019    --------------------------------------------------------------------------------------------------  ASSESSMENT AND PLAN: Coronary artery disease with stable angina: Overall, Jamie [name] feels stable to slightly better compared with 6 months ago.  He main complaint continues to be exertional dyspnea, that is likely multifactorial but has been her anginal equivalent in the past.  We  have agreed to continue with medical therapy, including antianginal regimen of isosorbide mononitrate 60 mg BID and metoprolol tartrate 25 mg BID.  I have encouraged Jamie [name] to increase her activity as tolerated and to continue working on weight loss (though I suggest that she stop the OTC weight loss supplement that she is using given unproven benefit and likely correlation with recent nausea).  Aortic stenosis and chronic HFpEF: Noted to be moderate by last echo in 2020.  Given stable NYHA class III symptoms, we will plan to repeat an echo when Jamie [name] is seen for f/u in ~6 months.  I think it is reasonable to transition from standing furosemide to furosemide 20 mg daily as needed for weight gain/edema.  Hypertension: BP well-controlled today.  No medication changes at this time.  Hyperlipidemia: Most recent lipid panel through Dr. Harrell Lark in 01/2020 showed LDL 61, HDL 42, and triglycerides 196.  We will plan to continue atorvastatin 80 mg daily.  Morbid obesity: BMI remains > 40.  Weight loss encouraged though diet and exercise.  Follow-up: Return to clinic in 6 months.  Nelva Bush, MD 04/19/2020 10:31 AM

## 2020-04-20 ENCOUNTER — Encounter: Payer: Self-pay | Admitting: Internal Medicine

## 2020-06-19 ENCOUNTER — Other Ambulatory Visit: Payer: Self-pay | Admitting: Internal Medicine

## 2020-06-22 ENCOUNTER — Other Ambulatory Visit: Payer: Self-pay | Admitting: Internal Medicine

## 2020-07-24 ENCOUNTER — Other Ambulatory Visit: Payer: Self-pay | Admitting: Internal Medicine

## 2020-09-25 ENCOUNTER — Other Ambulatory Visit: Payer: Self-pay | Admitting: Internal Medicine

## 2020-10-11 ENCOUNTER — Other Ambulatory Visit: Payer: Self-pay

## 2020-10-11 ENCOUNTER — Ambulatory Visit (INDEPENDENT_AMBULATORY_CARE_PROVIDER_SITE_OTHER): Payer: Medicare Other

## 2020-10-11 DIAGNOSIS — I350 Nonrheumatic aortic (valve) stenosis: Secondary | ICD-10-CM | POA: Diagnosis not present

## 2020-10-11 LAB — ECHOCARDIOGRAM COMPLETE
AR max vel: 1.12 cm2
AV Area VTI: 1.04 cm2
AV Area mean vel: 1.06 cm2
AV Mean grad: 16 mmHg
AV Peak grad: 28.2 mmHg
Ao pk vel: 2.65 m/s
Area-P 1/2: 2.91 cm2
MV VTI: 1.26 cm2
S' Lateral: 2.7 cm

## 2020-10-11 MED ORDER — PERFLUTREN LIPID MICROSPHERE
1.0000 mL | INTRAVENOUS | Status: AC | PRN
  Administered 2020-10-11: 2 mL via INTRAVENOUS

## 2020-10-17 ENCOUNTER — Telehealth: Payer: Self-pay | Admitting: Internal Medicine

## 2020-10-17 NOTE — Telephone Encounter (Signed)
  Patient Consent for Virtual Visit         Jamie [name] has provided verbal consent on 10/17/2020 for a virtual visit (video or telephone).   CONSENT FOR VIRTUAL VISIT FOR:  Jamie [name]  By participating in this virtual visit I agree to the following:  I hereby voluntarily request, consent and authorize [facility] and its employed or contracted physicians, physician assistants, nurse practitioners or other licensed health care professionals (the Practitioner), to provide me with telemedicine health care services (the "Services") as deemed necessary by the treating Practitioner. I acknowledge and consent to receive the Services by the Practitioner via telemedicine. I understand that the telemedicine visit will involve communicating with the Practitioner through live audiovisual communication technology and the disclosure of certain medical information by electronic transmission. I acknowledge that I have been given the opportunity to request an in-person assessment or other available alternative prior to the telemedicine visit and am voluntarily participating in the telemedicine visit.  I understand that I have the right to withhold or withdraw my consent to the use of telemedicine in the course of my care at any time, without affecting my right to future care or treatment, and that the Practitioner or I may terminate the telemedicine visit at any time. I understand that I have the right to inspect all information obtained and/or recorded in the course of the telemedicine visit and may receive copies of available information for a reasonable fee.  I understand that some of the potential risks of receiving the Services via telemedicine include:  Marland Kitchen Delay or interruption in medical evaluation due to technological equipment failure or disruption; . Information transmitted may not be sufficient (e.g. poor resolution of images) to allow for appropriate medical decision making by the Practitioner;  and/or  . In rare instances, security protocols could fail, causing a breach of personal health information.  Furthermore, I acknowledge that it is my responsibility to provide information about my medical history, conditions and care that is complete and accurate to the best of my ability. I acknowledge that Practitioner's advice, recommendations, and/or decision may be based on factors not within their control, such as incomplete or inaccurate data provided by me or distortions of diagnostic images or specimens that may result from electronic transmissions. I understand that the practice of medicine is not an exact science and that Practitioner makes no warranties or guarantees regarding treatment outcomes. I acknowledge that a copy of this consent can be made available to me via my patient portal ([facility]), or I can request a printed copy by calling the office of [facility].    I understand that my insurance will be billed for this visit.   I have read or had this consent read to me. . I understand the contents of this consent, which adequately explains the benefits and risks of the Services being provided via telemedicine.  . I have been provided ample opportunity to ask questions regarding this consent and the Services and have had my questions answered to my satisfaction. . I give my informed consent for the services to be provided through the use of telemedicine in my medical care

## 2020-10-18 ENCOUNTER — Ambulatory Visit: Payer: Medicare Other | Admitting: Internal Medicine

## 2020-11-09 ENCOUNTER — Encounter: Payer: Self-pay | Admitting: Internal Medicine

## 2020-11-09 ENCOUNTER — Telehealth (INDEPENDENT_AMBULATORY_CARE_PROVIDER_SITE_OTHER): Payer: Medicare Other | Admitting: Internal Medicine

## 2020-11-09 ENCOUNTER — Other Ambulatory Visit: Payer: Self-pay

## 2020-11-09 VITALS — Ht 65.0 in | Wt 255.0 lb

## 2020-11-09 DIAGNOSIS — I5032 Chronic diastolic (congestive) heart failure: Secondary | ICD-10-CM

## 2020-11-09 DIAGNOSIS — I10 Essential (primary) hypertension: Secondary | ICD-10-CM

## 2020-11-09 DIAGNOSIS — E1169 Type 2 diabetes mellitus with other specified complication: Secondary | ICD-10-CM | POA: Insufficient documentation

## 2020-11-09 DIAGNOSIS — I350 Nonrheumatic aortic (valve) stenosis: Secondary | ICD-10-CM

## 2020-11-09 DIAGNOSIS — I25118 Atherosclerotic heart disease of native coronary artery with other forms of angina pectoris: Secondary | ICD-10-CM

## 2020-11-09 DIAGNOSIS — E785 Hyperlipidemia, unspecified: Secondary | ICD-10-CM

## 2020-11-09 NOTE — Patient Instructions (Signed)
Medication Instructions:  1.Take the furosemide (Lasix) only as needed for swelling or weight gain.  *If you need a refill on your cardiac medications before your next appointment, please call your pharmacy*   Lab Work: None If you have labs (blood work) drawn today and your tests are completely normal, you will receive your results only by: Marland Kitchen MyChart Message (if you have MyChart) OR . A paper copy in the mail If you have any lab test that is abnormal or we need to change your treatment, we will call you to review the results.   Testing/Procedures: None   Follow-Up: At [facility], you and your health needs are our priority.  As part of our continuing mission to provide you with exceptional heart care, we have created designated Provider Care Teams.  These Care Teams include your primary Cardiologist (physician) and Advanced Practice Providers (APPs -  Physician Assistants and Nurse Practitioners) who all work together to provide you with the care you need, when you need it.   Your next appointment:   6 month(s)  The format for your next appointment:   In Person  Provider:   Nelva Bush, MD

## 2020-11-09 NOTE — Progress Notes (Signed)
Virtual Visit via Telephone Note   This visit type was conducted due to national recommendations for restrictions regarding the COVID-19 Pandemic (e.g. social distancing) in an effort to limit this patient's exposure and mitigate transmission in our community.  Due to her co-morbid illnesses, this patient is at least at moderate risk for complications without adequate follow up.  This format is felt to be most appropriate for this patient at this time.  The patient did not have access to video technology/had technical difficulties with video requiring transitioning to audio format only (telephone).  All issues noted in this document were discussed and addressed.  No physical exam could be performed with this format.  Please refer to the patient's chart for her  consent to telehealth for [facility].    Date:  11/09/2020   ID:  Jamie [name], DOB 07/01/1946, MRN [id number] The patient was identified using 2 identifiers.  Patient Location: Home Provider Location: Home Office   PCP:  Spry, Marsh Dolly., MD   [place]  Cardiologist:  Nelva Bush, MD   Evaluation Performed:  Follow-Up Visit  Chief Complaint:  Follow-up coronary artery disease, HFpEF, and aortic stenosis  History of Present Illness:    Jamie [name] is a 75 y.o. female with history of coronary artery disease status post CABG (3149), diastolic heart failure, hyperlipidemia, carotid artery stenosis, breast cancer status post bilateral mastectomies, diabetes mellitus, COPD, sleep apnea, and obesity.  We are speaking today for follow-up of CAD and chronic HFpEF.  I last saw her in 04/2020, which time she reported stable exertional dyspnea though overall she was able to perform more activities compared with 6 to 12 months ago.  She denied chest pain.  We agreed to change her furosemide to 20 mg daily as needed dosing for swelling/weight gain.  Echocardiogram last month showed normal LVEF with grade 2  diastolic dysfunction and stable mild-moderate aortic stenosis.  Today, Jamie [name] reports that she is actually feeling a bit better from a heart standpoint than at our prior visits.  She and her entire family contracted COVID-19 in 07/2020.  She had mild symptoms but her husband ultimately passed away (Jamie [name] and her family all took ivermectin for treatment).  She has had to become more active following her husband's death, as he previously did most of the chores at home and also helped to care for their grandchildren.  These tasks have now fallen on Jamie [name].  She feels like her stamina and breathing are improving.  She had a chronic cough after COVID-19 infection that ultimately resolved after a course of prednisone.  She denies chest pain, palpitations, lightheadedness, and edema.  She is currently using furosemide on a daily basis but inquires about using it on an as-needed basis (this is how it was previously prescribed).  She is trying to minimize her sodium intake.  She feels like she may have put on some weight over the last few months, which she attributes to poor diet following her husband's death.   Past Medical History:  Diagnosis Date  . Anxiety   . Asthma   . Breast cancer ([place]) 08/2011   lt. breast ca  . CAD (coronary artery disease)    NSTEMI 5/13:  LHC demonstrated oLM 75%, pLAD 80%, RCA occluded, filled by collaterals from the LAD.  Echo 12/10/11: EF 60%.; CABG 12/13/11: LIMA-LAD, SVG-RCA, SVG-diagonal and OM.   Marland Kitchen Carotid artery disease ([place])    a.  Pre-CABG  Dopplers 5/13: Bilateral 40-59%; b.  Carotid US 3/22: RICA 02-54%, LICA 27-06%, normal subclavians bilaterally >> FU 6 months;  c. Carotid US 23/76: RICA 28-31%; LICA 51-76%, normal subclavians >> FU 1 year // d. Carotid US 11/17:  Stable 40-59% bilateral ICA stenosis; F/U 1 year.  Marland Kitchen COPD (chronic obstructive pulmonary disease) ([place])   . DM2 (diabetes mellitus, type 2) ([facility])    metformin  . Gallstones    Symptomatic  . HLD  (hyperlipidemia)   . Hx of cardiovascular stress test    Myoview 5/16:  small area of inf-lat ischemia, EF 70%; Low Risk >> try medical Rx  . Hx of echocardiogram    Echo 5/16:  EF 60-65%, no RWMA, Gr 1 DD, Ao sclerosis, mild AS, mean 14 mmHg, MAC  . Hx of migraines   . Hypertension   . Myocardial infarction ([place])   . Obesity   . Shortness of breath   . Sleep apnea    sleep study2-3 yrs ago lost weight afterward but gained back  . UTI (lower urinary tract infection)    freq-bladder implant -removed   Past Surgical History:  Procedure Laterality Date  . ABDOMINAL HYSTERECTOMY  1977  . APPENDECTOMY    . AXILLARY LYMPH NODE DISSECTION  10/18/2011   Procedure: AXILLARY LYMPH NODE DISSECTION;  Surgeon: Merrie Roof, MD;  Location: [place];  Service: General;  Laterality: Left;  . BREAST RECONSTRUCTION  10/18/2011   Procedure: BREAST RECONSTRUCTION;  Surgeon: Theodoro Kos, DO;  Location: [place];  Service: Plastics;  Laterality: Bilateral;   bilateral breast reconstruction with bilateral tissue expander and placement of flex hd.  Marland Kitchen CARDIAC CATHETERIZATION    . CESAREAN SECTION  04/1976  . CHOLECYSTECTOMY N/A 08/17/2018   Procedure: LAPAROSCOPIC CHOLECYSTECTOMY;  Surgeon: Coralie Keens, MD;  Location: [place];  Service: General;  Laterality: N/A;  . COLONOSCOPY WITH PROPOFOL N/A 06/20/2019   Procedure: COLONOSCOPY WITH PROPOFOL;  Surgeon: Carol Ada, MD;  Location: [place];  Service: Endoscopy;  Laterality: N/A;  . CORONARY ARTERY BYPASS GRAFT  12/13/2011   Procedure: CORONARY ARTERY BYPASS GRAFTING (CABG);  Surgeon: Gaye Pollack, MD;  Location: [facility];  Service: Open Heart Surgery;  Laterality: N/A;  Times four using endoscopically harvested left greater saphenous vein and left internal mammary artery. Right greater saphenous vein attempted; not appropriate for vein harvest.  . EYE SURGERY     bilateral cataract extraction   . INTRAVASCULAR PRESSURE WIRE/FFR STUDY N/A 08/22/2017    Procedure: INTRAVASCULAR PRESSURE WIRE/FFR STUDY;  Surgeon: Nelva Bush, MD;  Location: [place] CV LAB;  Service: Cardiovascular;  Laterality: N/A;  . LAPAROSCOPIC GASTRIC BANDING  2010  . LEFT HEART CATHETERIZATION WITH CORONARY ANGIOGRAM N/A 12/11/2011   Procedure: LEFT HEART CATHETERIZATION WITH CORONARY ANGIOGRAM;  Surgeon: Larey Dresser, MD;  Location[facility] C[facility];  Service: Cardiovascular;  Laterality: N/A;  . LESION REMOVAL  04/09/2012   Procedure: LESION REMOVAL;  Surgeon: Theodoro Kos, DO;  Location: [place];  Service: Plastics;  Laterality: Bilateral;  . MASTECTOMY W/ SENTINEL NODE BIOPSY  10/18/2011   Procedure: MASTECTOMY WITH SENTINEL LYMPH NODE BIOPSY;  Surgeon: Merrie Roof, MD;  Location: [place];  Service: General;  Laterality: Bilateral;  bilateral mastectomy and left sentinel node biopsy  . NASAL SINUS SURGERY    . POLYPECTOMY  06/20/2019   Procedure: POLYPECTOMY;  Surgeon: Carol Ada, MD;  Location: [place];  Service: Endoscopy;;  . PORT-A-CATH REMOVAL  04/09/2012  Procedure: REMOVAL PORT-A-CATH;  Surgeon: Merrie Roof, MD;  Location: [place];  Service: General;  Laterality: Right;  . PORTACATH PLACEMENT  12/02/2011   Procedure: INSERTION PORT-A-CATH;  Surgeon: Merrie Roof, MD;  Location: [place];  Service: General;  Laterality: Right;  . RIGHT/LEFT HEART CATH AND CORONARY/GRAFT ANGIOGRAPHY N/A 08/22/2017   Procedure: RIGHT/LEFT HEART CATH AND CORONARY/GRAFT ANGIOGRAPHY;  Surgeon: Nelva Bush, MD;  Location: [place] CV LAB;  Service: Cardiovascular;  Laterality: N/A;  . TONSILLECTOMY    . US ECHOCARDIOGRAPHY     at [facility] Left 11/26/2013   Procedure: VIDEO BRONCHOSCOPY WITH ENDOBRONCHIAL NAVIGATION;  Surgeon: Collene Gobble, MD;  Location: [facility];  Service: Thoracic;  Laterality: Left;     Current Meds  Medication Sig  . albuterol (PROVENTIL) (2.5  MG/3ML) 0.083% nebulizer solution Inhale 3 mLs into the lungs every 6 (six) hours as needed for wheezing or shortness of breath.  Marland Kitchen aspirin EC 81 MG tablet Take 1 tablet (81 mg total) by mouth daily.  Marland Kitchen atorvastatin (LIPITOR) 80 MG tablet Take 1 tablet (80 mg total) by mouth daily at 6 PM.  . buPROPion (WELLBUTRIN XL) 300 MG 24 hr tablet Take 300 mg by mouth daily.   . cephALEXin (KEFLEX) 500 MG capsule Take 500 mg by mouth daily.   . Cholecalciferol (VITAMIN D3) 1000 units CAPS Take 1,000 Units by mouth daily.   . fluticasone (FLONASE) 50 MCG/ACT nasal spray Place 2 sprays into both nostrils daily.   . furosemide (LASIX) 20 MG tablet Take 20 mg by mouth daily as needed (for swelling or weight gain).  . Glycopyrrolate-Formoterol 9-4.8 MCG/ACT AERO Inhale 2 puffs into the lungs daily.  . isosorbide mononitrate (IMDUR) 60 MG 24 hr tablet Take 1 tablet by mouth twice daily  . lisinopril (ZESTRIL) 5 MG tablet Take 1 tablet by mouth once daily  . loratadine (CLARITIN) 10 MG tablet Take 10 mg by mouth daily.  . metFORMIN (GLUCOPHAGE) 500 MG tablet Take 1 tablet (500 mg total) by mouth 2 (two) times daily.  . metoprolol tartrate (LOPRESSOR) 25 MG tablet Take 1 tablet by mouth twice daily  . Multiple Vitamin (MULTI VITAMIN DAILY PO) Take 1 tablet by mouth daily.  . nitroGLYCERIN (NITROSTAT) 0.4 MG SL tablet Place 1 tablet (0.4 mg total) under the tongue every 5 (five) minutes as needed.  Vladimir Faster Glycol-Propyl Glycol 0.4-0.3 % SOLN Place 1 drop into both eyes 4 (four) times daily as needed (for dry eyes).   . [DISCONTINUED] furosemide (LASIX) 20 MG tablet Take 1 tablet (20 mg total) by mouth daily as needed for edema (swelling). (Patient taking differently: Take 20 mg by mouth daily.)     Allergies:   Penicillins, Vicodin [hydrocodone-acetaminophen], Duloxetine hcl, Sulfa antibiotics, and Sulfamethoxazole   Social History   Tobacco Use  . Smoking status: Former Smoker    Packs/day: 2.00     Years: 40.00    Pack years: 80.00    Types: Cigarettes    Quit date: 08/25/2006    Years since quitting: 14.2  . Smokeless tobacco: Never Used  Vaping Use  . Vaping Use: Never used  Substance Use Topics  . Alcohol use: Yes    Comment: 1-2 times a year  . Drug use: No     Family Hx: The patient's family history includes Breast cancer in her maternal aunt and maternal aunt; Heart disease in her father and  mother; Ovarian cancer in her paternal aunt. There is no history of Anesthesia problems, Hypotension, Malignant hyperthermia, or Pseudochol deficiency.  ROS:   Please see the history of present illness.  All other systems reviewed and are negative.   Prior CV studies:   The following studies were reviewed today:  TTE (10/11/2020): Normal LV size and wall thickness.  LVEF 60-65% with grade 2 diastolic dysfunction.  Normal RV size and function.  Mild LA enlargement.  Mild mitral annular calcification with mildly elevated mean gradient (4-5 mmHg).  Calcified aortic valve with mild to moderate stenosis (mean gradient 16 mmHg, AVA 1.0 mmHg).  Labs/Other Tests and Data Reviewed:    EKG:  No ECG reviewed.  Recent Labs: No results found for requested labs within last 8760 hours.   Recent Lipid Panel Lab Results  Component Value Date/Time   CHOL 131 03/30/2019 11:45 AM   TRIG 192 (H) 03/30/2019 11:45 AM   HDL 42 03/30/2019 11:45 AM   CHOLHDL 3.1 03/30/2019 11:45 AM   CHOLHDL 3 03/10/2012 10:15 AM   LDLCALC 51 03/30/2019 11:45 AM    Wt Readings from Last 3 Encounters:  11/09/20 255 lb (115.7 kg)  04/19/20 252 lb (114.3 kg)  10/14/19 249 lb 6 oz (113.1 kg)     Objective:    Vital Signs:  Ht _0  (1.651 m)   Wt 255 lb (115.7 kg)   BMI 42.43 kg/m    VITAL SIGNS:  reviewed  ASSESSMENT & PLAN:    Coronary artery disease with stable angina: Shortness of breath (which has been an anginal equivalent in the past) has improved with increased activity.  We will defer medication  changes today, continuing antianginal therapy with metoprolol and isosorbide mononitrate, as well as secondary prevention with ASA and atorvastatin.  Chronic HFpEF: Symptoms improving with increased activity.  Jamie [name] notes normal BP's when seen by other providers (she does not check her BP regularly at home).  I think it is reasonable for her to use furosemide 20 mg daily as needed for edema/weight gain.  I encouraged her to continue minimizing her sodium intake.  Aortic stenosis: Stable findings on recent echo.  Continue clinic follow-up with repeat echo in ~2 years.  Hypertension: BP reportedly well-controlled when seeing other providers.  No medications changes today.  Hyperlipidemia associated with type 2 diabetes mellitus: Most recent lipid panel through her PCP last month showed TC 141, TG 313, and LDL 63.  Continue high dose statin therapy and work on lifestyle modifications to help lower triglycerides.  Ongoing management of DM per Dr. Harrell Lark.  Morbid obesity: Weight loss encouraged through diet and exercise in the setting of BMI > 40 with multiple comorbidities.  Time:   Today, I have spent 12 minutes with the patient with telehealth technology discussing the above problems.     Medication Adjustments/Labs and Tests Ordered: Current medicines are reviewed at length with the patient today.  Concerns regarding medicines are outlined above.   Tests Ordered: None.  Medication Changes: None.  Follow Up:  In Person in 6 month(s)  Signed, Nelva Bush, MD  11/09/2020 11:51 AM    [place]

## 2020-12-26 ENCOUNTER — Other Ambulatory Visit: Payer: Self-pay | Admitting: Internal Medicine

## 2020-12-26 NOTE — Telephone Encounter (Signed)
Please advise if ok to refill Furosemide 20 mg qd prn. Last filled by Historical Provider.

## 2021-01-28 ENCOUNTER — Other Ambulatory Visit: Payer: Self-pay | Admitting: Internal Medicine

## 2021-04-03 ENCOUNTER — Encounter: Payer: Self-pay | Admitting: Internal Medicine

## 2021-04-03 NOTE — Telephone Encounter (Signed)
This encounter was created in error - please disregard.

## 2021-04-04 ENCOUNTER — Other Ambulatory Visit: Payer: Self-pay

## 2021-04-04 ENCOUNTER — Ambulatory Visit (INDEPENDENT_AMBULATORY_CARE_PROVIDER_SITE_OTHER): Payer: Medicare Other | Admitting: Internal Medicine

## 2021-04-04 ENCOUNTER — Encounter: Payer: Self-pay | Admitting: Internal Medicine

## 2021-04-04 VITALS — BP 120/60 | HR 73 | Ht 65.0 in | Wt 255.5 lb

## 2021-04-04 DIAGNOSIS — I25118 Atherosclerotic heart disease of native coronary artery with other forms of angina pectoris: Secondary | ICD-10-CM

## 2021-04-04 DIAGNOSIS — I350 Nonrheumatic aortic (valve) stenosis: Secondary | ICD-10-CM | POA: Diagnosis not present

## 2021-04-04 DIAGNOSIS — R0602 Shortness of breath: Secondary | ICD-10-CM | POA: Diagnosis not present

## 2021-04-04 DIAGNOSIS — I5032 Chronic diastolic (congestive) heart failure: Secondary | ICD-10-CM

## 2021-04-04 DIAGNOSIS — I25119 Atherosclerotic heart disease of native coronary artery with unspecified angina pectoris: Secondary | ICD-10-CM | POA: Diagnosis not present

## 2021-04-04 DIAGNOSIS — E785 Hyperlipidemia, unspecified: Secondary | ICD-10-CM

## 2021-04-04 DIAGNOSIS — I10 Essential (primary) hypertension: Secondary | ICD-10-CM

## 2021-04-04 DIAGNOSIS — E1169 Type 2 diabetes mellitus with other specified complication: Secondary | ICD-10-CM

## 2021-04-04 MED ORDER — PREDNISONE 20 MG PO TABS: 40.0000 mg | ORAL_TABLET | Freq: Every day | ORAL | 0 refills | Status: AC

## 2021-04-04 NOTE — Progress Notes (Signed)
Follow-up Outpatient Visit Date: 04/04/2021  Primary Care Provider: Verdell Carmine., MD [address] [place] [address]  Chief Complaint: Shortness of breath  HPI:  Jamie [name] is a 75 y.o. female with history of coronary artery disease status post CABG (7408), diastolic heart failure, hyperlipidemia, carotid artery stenosis, breast cancer status post bilateral mastectomies, diabetes mellitus, COPD, sleep apnea, and obesity, who presents for follow-up of coronary artery disease and HFpEF.  We last spoke via virtual visit in April, at which time Jamie [name] was feeling a bit better from a heart standpoint.  She was recovering from COVID-19 in 07/2020, noting that her husband died from COVID-63 at that time.  We did not make any medication changes or pursue additional testing.  Today, Jamie [name] reports that she has been feeling significantly more [name] of breath over the last month.  She was treated with a 5-day course of antibiotics by her PCP and noted improvement in her cough but no significant change in her shortness of breath.  She gets [name] of breath with minimal activity and has to stop and rest frequently to catch her breath.  She started taking her furosemide a few months ago again due to leg swelling.  She notes minimal leg edema.  She has slept in a recliner for many years and continues to do so.  She denies chest pain but notes that she never had any chest pain even leading up to her CABG in 2013.  Few days ago, she felt so bad and took sublingual nitroglycerin.  After few minutes, she began to feel better again though she is unsure if it was actually the nitroglycerin that helped.  She did a home COVID-19 test yesterday and reports that it was negative.  --------------------------------------------------------------------------------------------------  Past Medical History:  Diagnosis Date   Anxiety    Asthma    Breast cancer ([facility]) 08/2011   lt. breast ca   CAD (coronary  artery disease)    NSTEMI 5/13:  LHC demonstrated oLM 75%, pLAD 80%, RCA occluded, filled by collaterals from the LAD.  Echo 12/10/11: EF 60%.; CABG 12/13/11: LIMA-LAD, SVG-RCA, SVG-diagonal and OM.    Carotid artery disease ([facility])    a.  Pre-CABG Dopplers 5/13: Bilateral 40-59%; b.  Carotid US 1/44: RICA 81-85%, LICA 63-14%, normal subclavians bilaterally >> FU 6 months;  c. Carotid US 97/02: RICA 63-78%; LICA 58-85%, normal subclavians >> FU 1 year // d. Carotid US 11/17:  Stable 40-59% bilateral ICA stenosis; F/U 1 year.   COPD (chronic obstructive pulmonary disease) (HCC)    DM2 (diabetes mellitus, type 2) (HCC)    metformin   Gallstones    Symptomatic   HLD (hyperlipidemia)    Hx of cardiovascular stress test    Myoview 5/16:  small area of inf-lat ischemia, EF 70%; Low Risk >> try medical Rx   Hx of echocardiogram    Echo 5/16:  EF 60-65%, no RWMA, Gr 1 DD, Ao sclerosis, mild AS, mean 14 mmHg, MAC   Hx of migraines    Hypertension    Myocardial infarction ([facility])    Obesity    Shortness of breath    Sleep apnea    sleep study2-3 yrs ago lost weight afterward but gained back   UTI (lower urinary tract infection)    freq-bladder implant -removed   Past Surgical History:  Procedure Laterality Date   ABDOMINAL HYSTERECTOMY  1977   APPENDECTOMY     AXILLARY LYMPH NODE DISSECTION  10/18/2011   Procedure: AXILLARY LYMPH NODE DISSECTION;  Surgeon: Merrie Roof, MD;  Location: [facility];  Service: General;  Laterality: Left;   BREAST RECONSTRUCTION  10/18/2011   Procedure: BREAST RECONSTRUCTION;  Surgeon: Theodoro Kos, DO;  Location: [facility];  Service: Plastics;  Laterality: Bilateral;   bilateral breast reconstruction with bilateral tissue expander and placement of flex hd.   CARDIAC CATHETERIZATION     CESAREAN SECTION  04/1976   CHOLECYSTECTOMY N/A 08/17/2018   Procedure: LAPAROSCOPIC CHOLECYSTECTOMY;  Surgeon: Coralie Keens, MD;  Location: [facility];  Service: General;  Laterality: N/A;    COLONOSCOPY WITH PROPOFOL N/A 06/20/2019   Procedure: COLONOSCOPY WITH PROPOFOL;  Surgeon: Carol Ada, MD;  Location: [facility];  Service: Endoscopy;  Laterality: N/A;   CORONARY ARTERY BYPASS GRAFT  12/13/2011   Procedure: CORONARY ARTERY BYPASS GRAFTING (CABG);  Surgeon: Gaye Pollack, MD;  Location: [facility];  Service: Open Heart Surgery;  Laterality: N/A;  Times four using endoscopically harvested left greater saphenous vein and left internal mammary artery. Right greater saphenous vein attempted; not appropriate for vein harvest.   EYE SURGERY     bilateral cataract extraction    INTRAVASCULAR PRESSURE WIRE/FFR STUDY N/A 08/22/2017   Procedure: INTRAVASCULAR PRESSURE WIRE/FFR STUDY;  Surgeon: Nelva Bush, MD;  Location: [facility];  Service: Cardiovascular;  Laterality: N/A;   LAPAROSCOPIC GASTRIC BANDING  2010   LEFT HEART CATHETERIZATION WITH CORONARY ANGIOGRAM N/A 12/11/2011   Procedure: LEFT HEART CATHETERIZATION WITH CORONARY ANGIOGRAM;  Surgeon: Larey Dresser, MD;  Location: [facility] CATH LAB;  Service: Cardiovascular;  Laterality: N/A;   LESION REMOVAL  04/09/2012   Procedure: LESION REMOVAL;  Surgeon: Theodoro Kos, DO;  Location: [facility];  Service: Plastics;  Laterality: Bilateral;   MASTECTOMY W/ SENTINEL NODE BIOPSY  10/18/2011   Procedure: MASTECTOMY WITH SENTINEL LYMPH NODE BIOPSY;  Surgeon: Merrie Roof, MD;  Location: [facility];  Service: General;  Laterality: Bilateral;  bilateral mastectomy and left sentinel node biopsy   NASAL SINUS SURGERY     POLYPECTOMY  06/20/2019   Procedure: POLYPECTOMY;  Surgeon: Carol Ada, MD;  Location: [facility] ENDOSCOPY;  Service: Endoscopy;;   PORT-A-CATH REMOVAL  04/09/2012   Procedure: REMOVAL PORT-A-CATH;  Surgeon: Merrie Roof, MD;  Location: [facility];  Service: General;  Laterality: Right;   PORTACATH PLACEMENT  12/02/2011   Procedure: INSERTION PORT-A-CATH;  Surgeon: Merrie Roof, MD;  Location: [facility];  Service: General;  Laterality: Right;   RIGHT/LEFT HEART CATH AND CORONARY/GRAFT ANGIOGRAPHY N/A 08/22/2017   Procedure: RIGHT/LEFT HEART CATH AND CORONARY/GRAFT ANGIOGRAPHY;  Surgeon: Nelva Bush, MD;  Location: [facility] [facility];  Service: Cardiovascular;  Laterality: N/A;   TONSILLECTOMY     US ECHOCARDIOGRAPHY     at [facility] Left 11/26/2013   Procedure: VIDEO BRONCHOSCOPY WITH ENDOBRONCHIAL NAVIGATION;  Surgeon: Collene Gobble, MD;  Location: [facility] OR;  Service: Thoracic;  Laterality: Left;     Current Meds  Medication Sig   albuterol (PROVENTIL) (2.5 MG/3ML) 0.083% nebulizer solution Inhale 3 mLs into the lungs every 6 (six) hours as needed for wheezing or shortness of breath.   aspirin EC 81 MG tablet Take 1 tablet (81 mg total) by mouth daily.   atorvastatin (LIPITOR) 80 MG tablet TAKE 1 TABLET BY MOUTH ONCE DAILY AT  6  PM   azithromycin (ZITHROMAX) 250 MG tablet as directed.  buPROPion (WELLBUTRIN XL) 300 MG 24 hr tablet Take 300 mg by mouth daily.    cephALEXin (KEFLEX) 500 MG capsule Take 500 mg by mouth daily.    Cholecalciferol (VITAMIN D3) 1000 units CAPS Take 1,000 Units by mouth daily.    fluticasone (FLONASE) 50 MCG/ACT nasal spray Place 2 sprays into both nostrils daily.    furosemide (LASIX) 20 MG tablet TAKE 1 TABLET BY MOUTH ONCE DAILY AS NEEDED FOR  EDEMA  (SWELLING)   Glycopyrrolate-Formoterol 9-4.8 MCG/ACT AERO Inhale 2 puffs into the lungs daily.   isosorbide mononitrate (IMDUR) 60 MG 24 hr tablet Take 1 tablet by mouth twice daily   lisinopril (ZESTRIL) 5 MG tablet Take 1 tablet by mouth once daily   loratadine (CLARITIN) 10 MG tablet Take 10 mg by mouth daily.   metFORMIN (GLUCOPHAGE) 500 MG tablet Take 1 tablet (500 mg total) by mouth 2 (two) times daily.   metoprolol tartrate (LOPRESSOR) 25 MG tablet Take 1 tablet by mouth twice daily   Multiple Vitamin (MULTI VITAMIN DAILY PO) Take 1 tablet by  mouth daily.   nitroGLYCERIN (NITROSTAT) 0.4 MG SL tablet Place 1 tablet (0.4 mg total) under the tongue every 5 (five) minutes as needed.   Polyethyl Glycol-Propyl Glycol 0.4-0.3 % SOLN Place 1 drop into both eyes 4 (four) times daily as needed (for dry eyes).     Allergies: Penicillins, Vicodin [hydrocodone-acetaminophen], Duloxetine hcl, Sulfa antibiotics, and Sulfamethoxazole  Social History   Tobacco Use   Smoking status: Former    Packs/day: 2.00    Years: 40.00    Pack years: 80.00    Types: Cigarettes    Quit date: 08/25/2006    Years since quitting: 14.6   Smokeless tobacco: Never  Vaping Use   Vaping Use: Never used  Substance Use Topics   Alcohol use: Yes    Comment: 1-2 times a year   Drug use: No    Family History  Problem Relation Age of Onset   Heart disease Mother    Heart disease Father    Breast cancer Maternal Aunt    Ovarian cancer Paternal Aunt    Breast cancer Maternal Aunt    Anesthesia problems Neg Hx    Hypotension Neg Hx    Malignant hyperthermia Neg Hx    Pseudochol deficiency Neg Hx     Review of Systems: A 12-system review of systems was performed and was negative except as noted in the HPI.  --------------------------------------------------------------------------------------------------  Physical Exam: BP 120/60 (BP Location: Right Arm, Patient Position: Sitting, Cuff Size: Large)   Pulse 73   Ht _0  (1.651 m)   Wt 255 lb 8 oz (115.9 kg)   SpO2 97%   BMI 42.52 kg/m   General:  NAD. Neck: No gross JVD, though body habitus limits evaluation. Lungs: Mildly diminished breath sounds throughout without wheezes or crackles. Heart: Regular rate and rhythm with 2/6 systolic murmur.. Abdomen: Soft, nontender, nondistended. Extremities: Trace pretibial edema bilaterally.  EKG: Normal sinus rhythm without significant abnormality.  Lab Results  Component Value Date   WBC 5.1 06/20/2019   HGB 9.7 (L) 06/20/2019   HCT 30.7 (L)  06/20/2019   MCV 92.5 06/20/2019   PLT 150 06/20/2019    Lab Results  Component Value Date   NA 143 06/20/2019   K 3.8 06/20/2019   CL 113 (H) 06/20/2019   CO2 20 (L) 06/20/2019   BUN 11 06/20/2019   CREATININE 1.15 (H) 06/20/2019   GLUCOSE 104 (H)  06/20/2019   ALT 25 06/19/2019    Lab Results  Component Value Date   CHOL 131 03/30/2019   HDL 42 03/30/2019   LDLCALC 51 03/30/2019   TRIG 192 (H) 03/30/2019   CHOLHDL 3.1 03/30/2019    --------------------------------------------------------------------------------------------------  ASSESSMENT AND PLAN: Shortness of breath and coronary artery disease: Jamie [name] reports significant worsening of her baseline exertional dyspnea over the last month.  This did not improve with a course of empiric antibiotics prescribed by her PCP.  I am concerned that this may reflect worsening coronary insufficiency, as dyspnea has been her exertional equivalent in the past.  Acute exacerbation of her underlying obstructive lung disease is also a possibility though she is not wheezing on exam today.  We discussed medical therapy versus proceeding with repeat cardiac catheterization and have agreed to a trial of prednisone 40 mg daily x5 days, as she reports that steroids have helped her breathing in the past.  If she does not experience significant improvement with this when we see her for follow-up next week, cardiac catheterization and possible PCI at [facility] should be arranged.  We will continue her current medications for secondary prevention and antianginal therapy.  We will check a CBC, BMP, and BNP today.  Chronic HFpEF and aortic stenosis: Worsening dyspnea may reflect acute on chronic HFpEF.  However, Jamie [name] appears euvolemic with stable weight.  Echocardiogram in March showed stable mild to moderate aortic stenosis.  We will defer escalation of diuretic therapy today.  I will check a CBC, BMP, and BNP.  Hyperlipidemia associated with type  2 diabetes mellitus: Continue atorvastatin 80 mg daily.  Hypertension: Blood pressure well controlled.  No medication changes today.  Morbid obesity: BMI remains greater than 40 with multiple comorbidities.  Weight loss encouraged.  Follow-up: Return to clinic in 1 week.  Nelva Bush, MD 04/04/2021 1:59 PM

## 2021-04-04 NOTE — Patient Instructions (Signed)
Medication Instructions:   Your physician has recommended you make the following change in your medication:   START Prednisone 40 mg daily x 5 days  *If you need a refill on your cardiac medications before your next appointment, please call your pharmacy*   Lab Work:  TODAY: BMET, BNP, CBC  If you have labs (blood work) drawn today and your tests are completely normal, you will receive your results only by: [place] (if you have MyChart) OR A paper copy in the mail If you have any lab test that is abnormal or we need to change your treatment, we will call you to review the results.   Testing/Procedures:  None ordered   Follow-Up: At [facility], you and your health needs are our priority.  As part of our continuing mission to provide you with exceptional heart care, we have created designated Provider Care Teams.  These Care Teams include your primary Cardiologist (physician) and Advanced Practice Providers (APPs -  Physician Assistants and Nurse Practitioners) who all work together to provide you with the care you need, when you need it.  We recommend signing up for the patient portal called "MyChart".  Sign up information is provided on this After Visit Summary.  MyChart is used to connect with patients for Virtual Visits (Telemedicine).  Patients are able to view lab/test results, encounter notes, upcoming appointments, etc.  Non-urgent messages can be sent to your provider as well.   To learn more about what you can do with MyChart, go to [email or web address].    Your next appointment:   1 week(s)  The format for your next appointment:   In Person  Provider:   You may see Nelva Bush, MD or one of the following Advanced Practice Providers on your designated Care Team:   Murray Hodgkins, NP Christell Rubie, PA-C Marrianne Mood, PA-C Cadence Melville, [place]

## 2021-04-05 LAB — BASIC METABOLIC PANEL
BUN/Creatinine Ratio: 15 (ref 12–28)
BUN: 18 mg/dL (ref 8–27)
CO2: 23 mmol/L (ref 20–29)
Calcium: 9.7 mg/dL (ref 8.7–10.3)
Chloride: 106 mmol/L (ref 96–106)
Creatinine, Ser: 1.21 mg/dL — ABNORMAL HIGH (ref 0.57–1.00)
Glucose: 134 mg/dL — ABNORMAL HIGH (ref 65–99)
Potassium: 4.7 mmol/L (ref 3.5–5.2)
Sodium: 144 mmol/L (ref 134–144)
eGFR: 46 mL/min/{1.73_m2} — ABNORMAL LOW (ref >59)

## 2021-04-05 LAB — CBC
Hematocrit: 39.5 % (ref 34.0–46.6)
Hemoglobin: 13.1 g/dL (ref 11.1–15.9)
MCH: 29.2 pg (ref 26.6–33.0)
MCHC: 33.2 g/dL (ref 31.5–35.7)
MCV: 88 fL (ref 79–97)
Platelets: 191 x10E3/uL (ref 150–450)
RBC: 4.48 x10E6/uL (ref 3.77–5.28)
RDW: 12.2 % (ref 11.7–15.4)
WBC: 5.9 x10E3/uL (ref 3.4–10.8)

## 2021-04-05 LAB — BRAIN NATRIURETIC PEPTIDE: BNP: 61.7 pg/mL (ref 0.0–100.0)

## 2021-04-06 ENCOUNTER — Telehealth: Payer: Self-pay | Admitting: *Deleted

## 2021-04-06 NOTE — Telephone Encounter (Signed)
Attempted to call pt. No answer. Lmtcb.  

## 2021-04-06 NOTE — Telephone Encounter (Signed)
-----   Message from Nelva Bush, MD sent at 04/05/2021  2:24 PM EDT ----- Please let Ms. Vantil know that her labs show moderate but stable anemia.  Kidney function and electrolytes are stable.  BNP is normal, arguing against acute heart failure.  I recommend that she continue her current medications and follow-up as previously arranged.  I also encourage her to f/u with her PCP regarding her chronic anemia.

## 2021-04-11 ENCOUNTER — Ambulatory Visit (INDEPENDENT_AMBULATORY_CARE_PROVIDER_SITE_OTHER): Payer: Medicare Other | Admitting: Internal Medicine

## 2021-04-11 ENCOUNTER — Other Ambulatory Visit: Payer: Self-pay

## 2021-04-11 ENCOUNTER — Encounter: Payer: Self-pay | Admitting: Internal Medicine

## 2021-04-11 VITALS — BP 120/60 | HR 66 | Ht 65.0 in | Wt 253.0 lb

## 2021-04-11 DIAGNOSIS — J441 Chronic obstructive pulmonary disease with (acute) exacerbation: Secondary | ICD-10-CM | POA: Diagnosis not present

## 2021-04-11 DIAGNOSIS — I350 Nonrheumatic aortic (valve) stenosis: Secondary | ICD-10-CM | POA: Diagnosis not present

## 2021-04-11 DIAGNOSIS — I5032 Chronic diastolic (congestive) heart failure: Secondary | ICD-10-CM

## 2021-04-11 DIAGNOSIS — I25118 Atherosclerotic heart disease of native coronary artery with other forms of angina pectoris: Secondary | ICD-10-CM

## 2021-04-11 DIAGNOSIS — R0602 Shortness of breath: Secondary | ICD-10-CM

## 2021-04-11 NOTE — Patient Instructions (Signed)
Medication Instructions:  Your physician recommends that you continue on your current medications as directed. Please refer to the Current Medication list given to you today.  *If you need a refill on your cardiac medications before your next appointment, please call your pharmacy*   Lab Work: None ordered   If you have labs (blood work) drawn today and your tests are completely normal, you will receive your results only by: [place] (if you have MyChart) OR A paper copy in the mail If you have any lab test that is abnormal or we need to change your treatment, we will call you to review the results.   Testing/Procedures: None ordered  Follow-Up: At [facility], you and your health needs are our priority.  As part of our continuing mission to provide you with exceptional heart care, we have created designated Provider Care Teams.  These Care Teams include your primary Cardiologist (physician) and Advanced Practice Providers (APPs -  Physician Assistants and Nurse Practitioners) who all work together to provide you with the care you need, when you need it.  We recommend signing up for the patient portal called "MyChart".  Sign up information is provided on this After Visit Summary.  MyChart is used to connect with patients for Virtual Visits (Telemedicine).  Patients are able to view lab/test results, encounter notes, upcoming appointments, etc.  Non-urgent messages can be sent to your provider as well.   To learn more about what you can do with MyChart, go to [email or web address].    Your next appointment:   3 month(s)  The format for your next appointment:   In Person  Provider:   You may see Nelva Bush, MD  or one of the following Advanced Practice Providers on your designated Care Team:   Murray Hodgkins, NP Christell Kiyani, PA-C Marrianne Mood, PA-C Cadence Foley, [place]

## 2021-04-11 NOTE — Progress Notes (Signed)
Follow-up Outpatient Visit Date: 04/11/2021  Primary Care Provider: Verdell Carmine., MD [address] [place] [address]  Chief Complaint: Follow-up shortness of breath  HPI:  Jamie [name] is a 75 y.o. female with history of coronary artery disease status post CABG (8938), diastolic heart failure, hyperlipidemia, carotid artery stenosis, breast cancer status post bilateral mastectomies, diabetes mellitus, COPD, sleep apnea, and obesity, who presents for follow-up of coronary artery disease and heart failure.  I saw her last week at which time she reported significant worsening of her shortness of breath.  She had previously been treated with a 5-day course of antibiotics that helped her cough but did not make much impact to her shortness of breath.  5-day prednisone burst was prescribed.  Labs showed normal BNP and improved hemoglobin.  Chronic diuretic therapy was continued.  Today, Jamie [name] reports that she feels much better.  After two doses of prednisone, she began to feel decreased shortness of breath and more energy.  Breathing is almost back to baseline.  She took her last dose of prednisone yesterday.  She denies edema, as well as chest pain, jaw pain (anginal equivalent along with shortness of breath), palpitations, and lightheadedness.  She is not on an inhaled corticosteroid.  She was last seen by her pulmonologist ~1.5 years ago.  --------------------------------------------------------------------------------------------------  Past Medical History:  Diagnosis Date   Anxiety    Asthma    Breast cancer ([place]) 08/2011   lt. breast ca   CAD (coronary artery disease)    NSTEMI 5/13:  LHC demonstrated oLM 75%, pLAD 80%, RCA occluded, filled by collaterals from the LAD.  Echo 12/10/11: EF 60%.; CABG 12/13/11: LIMA-LAD, SVG-RCA, SVG-diagonal and OM.    Carotid artery disease ([place])    a.  Pre-CABG Dopplers 5/13: Bilateral 40-59%; b.  Carotid US 1/01: RICA 75-10%, LICA 25-85%, normal  subclavians bilaterally >> FU 6 months;  c. Carotid US 27/78: RICA 24-23%; LICA 53-61%, normal subclavians >> FU 1 year // d. Carotid US 11/17:  Stable 40-59% bilateral ICA stenosis; F/U 1 year.   COPD (chronic obstructive pulmonary disease) ([facility])    DM2 (diabetes mellitus, type 2) ([facility])    metformin   Gallstones    Symptomatic   HLD (hyperlipidemia)    Hx of cardiovascular stress test    Myoview 5/16:  small area of inf-lat ischemia, EF 70%; Low Risk >> try medical Rx   Hx of echocardiogram    Echo 5/16:  EF 60-65%, no RWMA, Gr 1 DD, Ao sclerosis, mild AS, mean 14 mmHg, MAC   Hx of migraines    Hypertension    Myocardial infarction ([place])    Obesity    Shortness of breath    Sleep apnea    sleep study2-3 yrs ago lost weight afterward but gained back   UTI (lower urinary tract infection)    freq-bladder implant -removed   Past Surgical History:  Procedure Laterality Date   ABDOMINAL HYSTERECTOMY  1977   APPENDECTOMY     AXILLARY LYMPH NODE DISSECTION  10/18/2011   Procedure: AXILLARY LYMPH NODE DISSECTION;  Surgeon: Merrie Roof, MD;  Location: [place];  Service: General;  Laterality: Left;   BREAST RECONSTRUCTION  10/18/2011   Procedure: BREAST RECONSTRUCTION;  Surgeon: Theodoro Kos, DO;  Location: [place];  Service: Plastics;  Laterality: Bilateral;   bilateral breast reconstruction with bilateral tissue expander and placement of flex hd.   CARDIAC CATHETERIZATION     CESAREAN SECTION  04/1976   CHOLECYSTECTOMY N/A 08/17/2018   Procedure: LAPAROSCOPIC CHOLECYSTECTOMY;  Surgeon: Coralie Keens, MD;  Location: [place];  Service: General;  Laterality: N/A;   COLONOSCOPY WITH PROPOFOL N/A 06/20/2019   Procedure: COLONOSCOPY WITH PROPOFOL;  Surgeon: Carol Ada, MD;  Location: [place];  Service: Endoscopy;  Laterality: N/A;   CORONARY ARTERY BYPASS GRAFT  12/13/2011   Procedure: CORONARY ARTERY BYPASS GRAFTING (CABG);  Surgeon: Gaye Pollack, MD;  Location: [place];  Service: Open  Heart Surgery;  Laterality: N/A;  Times four using endoscopically harvested left greater saphenous vein and left internal mammary artery. Right greater saphenous vein attempted; not appropriate for vein harvest.   EYE SURGERY     bilateral cataract extraction    INTRAVASCULAR PRESSURE WIRE/FFR STUDY N/A 08/22/2017   Procedure: INTRAVASCULAR PRESSURE WIRE/FFR STUDY;  Surgeon: Nelva Bush, MD;  Location: [place];  Service: Cardiovascular;  Laterality: N/A;   LAPAROSCOPIC GASTRIC BANDING  2010   LEFT HEART CATHETERIZATION WITH CORONARY ANGIOGRAM N/A 12/11/2011   Procedure: LEFT HEART CATHETERIZATION WITH CORONARY ANGIOGRAM;  Surgeon: Larey Dresser, MD;  Location: [facility] CATH [facility];  Service: Cardiovascular;  Laterality: N/A;   LESION REMOVAL  04/09/2012   Procedure: LESION REMOVAL;  Surgeon: Theodoro Kos, DO;  Location: [place];  Service: Plastics;  Laterality: Bilateral;   MASTECTOMY W/ SENTINEL NODE BIOPSY  10/18/2011   Procedure: MASTECTOMY WITH SENTINEL LYMPH NODE BIOPSY;  Surgeon: Merrie Roof, MD;  Location: [place];  Service: General;  Laterality: Bilateral;  bilateral mastectomy and left sentinel node biopsy   NASAL SINUS SURGERY     POLYPECTOMY  06/20/2019   Procedure: POLYPECTOMY;  Surgeon: Carol Ada, MD;  Location: [facility] ENDOSCOPY;  Service: Endoscopy;;   PORT-A-CATH REMOVAL  04/09/2012   Procedure: REMOVAL PORT-A-CATH;  Surgeon: Merrie Roof, MD;  Location: [place];  Service: General;  Laterality: Right;   PORTACATH PLACEMENT  12/02/2011   Procedure: INSERTION PORT-A-CATH;  Surgeon: Merrie Roof, MD;  Location: [place];  Service: General;  Laterality: Right;   RIGHT/LEFT HEART CATH AND CORONARY/GRAFT ANGIOGRAPHY N/A 08/22/2017   Procedure: RIGHT/LEFT HEART CATH AND CORONARY/GRAFT ANGIOGRAPHY;  Surgeon: Nelva Bush, MD;  Location: [place] [facility];  Service: Cardiovascular;  Laterality: N/A;   TONSILLECTOMY     US ECHOCARDIOGRAPHY      at [place] Left 11/26/2013   Procedure: VIDEO BRONCHOSCOPY WITH ENDOBRONCHIAL NAVIGATION;  Surgeon: Collene Gobble, MD;  Location: [facility];  Service: Thoracic;  Laterality: Left;    Current Meds  Medication Sig   albuterol (PROVENTIL) (2.5 MG/3ML) 0.083% nebulizer solution Inhale 3 mLs into the lungs every 6 (six) hours as needed for wheezing or shortness of breath.   aspirin EC 81 MG tablet Take 1 tablet (81 mg total) by mouth daily.   atorvastatin (LIPITOR) 80 MG tablet TAKE 1 TABLET BY MOUTH ONCE DAILY AT  6  PM   buPROPion (WELLBUTRIN XL) 300 MG 24 hr tablet Take 300 mg by mouth daily.    cephALEXin (KEFLEX) 500 MG capsule Take 500 mg by mouth daily.    Cholecalciferol (VITAMIN D3) 1000 units CAPS Take 1,000 Units by mouth daily.    fluticasone (FLONASE) 50 MCG/ACT nasal spray Place 2 sprays into both nostrils daily.    furosemide (LASIX) 20 MG tablet TAKE 1 TABLET BY MOUTH ONCE DAILY AS NEEDED FOR  EDEMA  (SWELLING)   Glycopyrrolate-Formoterol  9-4.8 MCG/ACT AERO Inhale 2 puffs into the lungs daily.   isosorbide mononitrate (IMDUR) 60 MG 24 hr tablet Take 1 tablet by mouth twice daily   lisinopril (ZESTRIL) 5 MG tablet Take 1 tablet by mouth once daily   loratadine (CLARITIN) 10 MG tablet Take 10 mg by mouth daily.   metFORMIN (GLUCOPHAGE) 500 MG tablet Take 500 mg by mouth daily with breakfast.   metoprolol tartrate (LOPRESSOR) 25 MG tablet Take 1 tablet by mouth twice daily   Multiple Vitamin (MULTI VITAMIN DAILY PO) Take 1 tablet by mouth daily.   nitroGLYCERIN (NITROSTAT) 0.4 MG SL tablet Place 1 tablet (0.4 mg total) under the tongue every 5 (five) minutes as needed.   Polyethyl Glycol-Propyl Glycol 0.4-0.3 % SOLN Place 1 drop into both eyes 4 (four) times daily as needed (for dry eyes).     Allergies: Penicillins, Vicodin [hydrocodone-acetaminophen], Duloxetine hcl, Sulfa antibiotics, and Sulfamethoxazole  Social History    Tobacco Use   Smoking status: Former    Packs/day: 2.00    Years: 40.00    Pack years: 80.00    Types: Cigarettes    Quit date: 08/25/2006    Years since quitting: 14.6   Smokeless tobacco: Never  Vaping Use   Vaping Use: Never used  Substance Use Topics   Alcohol use: Yes    Comment: 1-2 times a year   Drug use: No    Family History  Problem Relation Age of Onset   Heart disease Mother    Heart disease Father    Breast cancer Maternal Aunt    Ovarian cancer Paternal Aunt    Breast cancer Maternal Aunt    Anesthesia problems Neg Hx    Hypotension Neg Hx    Malignant hyperthermia Neg Hx    Pseudochol deficiency Neg Hx     Review of Systems: A 12-system review of systems was performed and was negative except as noted in the HPI.  --------------------------------------------------------------------------------------------------  Physical Exam: BP 120/60 (BP Location: Right Arm, Patient Position: Sitting, Cuff Size: Large)   Pulse 66   Ht _0  (1.651 m)   Wt 253 lb (114.8 kg)   SpO2 96%   BMI 42.10 kg/m   General:  NAD. Neck: No obvious JVD, though body habitus limits evaluation. Lungs: Mildly diminished breath sound throughout without wheezes or crackles. Heart: Regular rate and rhythm without murmurs, rubs, or gallops. Abdomen: Soft, nontender, nondistended. Extremities: No lower extremity edema.   Lab Results  Component Value Date   WBC 5.9 04/04/2021   HGB 13.1 04/04/2021   HCT 39.5 04/04/2021   MCV 88 04/04/2021   PLT 191 04/04/2021    Lab Results  Component Value Date   NA 144 04/04/2021   K 4.7 04/04/2021   CL 106 04/04/2021   CO2 23 04/04/2021   BUN 18 04/04/2021   CREATININE 1.21 (H) 04/04/2021   GLUCOSE 134 (H) 04/04/2021   ALT 25 06/19/2019    Lab Results  Component Value Date   CHOL 131 03/30/2019   HDL 42 03/30/2019   LDLCALC 51 03/30/2019   TRIG 192 (H) 03/30/2019   CHOLHDL 3.1 03/30/2019     --------------------------------------------------------------------------------------------------  ASSESSMENT AND PLAN: Shortness of breath and acute COPD exacerbation: Breathing much improved from last week with breathing almost back to baseline following prednisone burst.  I have encouraged her to reach out to her PCP and/or pulmonologist to discuss further long-term management of her COPD.  Continue current diuretic regimen.  Jamie [name]  may benefit from pulmonary rehab once her medications have been optimized.  Chronic HFpEF and aortic stenosis: Volume status stable with breathing back at baseline (NYHA class II-III).  Continue current regimen.    Coronary artery disease with stable angina: Given significant improvement in shortness of breath with prednisone burst, we will defer catheterization as primary pulmonary process was likely driving her shortness of breath last month.  Continue current medications for antianginal therapy and secondary prevention.  Morbid obesity: BMI remains > 40.  Jamie [name] would benefit from weight loss through diet and exercise.  Follow-up: Return to clinic in 3 months.  Nelva Bush, MD 04/11/2021 8:47 PM

## 2021-04-12 NOTE — Telephone Encounter (Signed)
Pt received results at office visit yesterday 04/11/21.

## 2021-04-19 ENCOUNTER — Other Ambulatory Visit: Payer: Self-pay | Admitting: Internal Medicine

## 2021-05-05 ENCOUNTER — Other Ambulatory Visit: Payer: Self-pay | Admitting: Internal Medicine

## 2021-05-21 ENCOUNTER — Other Ambulatory Visit: Payer: Self-pay | Admitting: Internal Medicine

## 2021-07-12 ENCOUNTER — Ambulatory Visit: Payer: Medicare Other | Admitting: Internal Medicine

## 2021-08-23 ENCOUNTER — Ambulatory Visit: Payer: Medicare Other | Admitting: Nurse Practitioner

## 2021-08-27 ENCOUNTER — Other Ambulatory Visit: Payer: Self-pay | Admitting: Internal Medicine

## 2021-09-10 ENCOUNTER — Other Ambulatory Visit: Payer: Self-pay | Admitting: Internal Medicine

## 2021-09-12 ENCOUNTER — Ambulatory Visit (INDEPENDENT_AMBULATORY_CARE_PROVIDER_SITE_OTHER): Payer: Medicare Other | Admitting: Nurse Practitioner

## 2021-09-12 ENCOUNTER — Other Ambulatory Visit: Payer: Self-pay

## 2021-09-12 ENCOUNTER — Encounter: Payer: Self-pay | Admitting: Nurse Practitioner

## 2021-09-12 VITALS — BP 134/60 | HR 60 | Ht 68.0 in | Wt 250.1 lb

## 2021-09-12 DIAGNOSIS — I350 Nonrheumatic aortic (valve) stenosis: Secondary | ICD-10-CM | POA: Diagnosis not present

## 2021-09-12 DIAGNOSIS — I2510 Atherosclerotic heart disease of native coronary artery without angina pectoris: Secondary | ICD-10-CM | POA: Diagnosis not present

## 2021-09-12 DIAGNOSIS — I6523 Occlusion and stenosis of bilateral carotid arteries: Secondary | ICD-10-CM | POA: Diagnosis not present

## 2021-09-12 DIAGNOSIS — I5032 Chronic diastolic (congestive) heart failure: Secondary | ICD-10-CM | POA: Diagnosis not present

## 2021-09-12 NOTE — Patient Instructions (Signed)
Medication Instructions:  - Your physician recommends that you continue on your current medications as directed. Please refer to the Current Medication list given to you today.  *If you need a refill on your cardiac medications before your next appointment, please call your pharmacy*   Lab Work: - none ordered  If you have labs (blood work) drawn today and your tests are completely normal, you will receive your results only by: Jamie [name] (if you have MyChart) OR A paper copy in the mail If you have any lab test that is abnormal or we need to change your treatment, we will call you to review the results.   Testing/Procedures:  1) Echocardiogram: (in March) - Your physician has requested that you have an echocardiogram. Echocardiography is a painless test that uses sound waves to create images of your heart. It provides your doctor with information about the size and shape of your heart and how well your hearts chambers and valves are working. This procedure takes approximately one hour. There are no restrictions for this procedure. There is a possibility that an IV may need to be started during your test to inject an image enhancing agent. This is done to obtain more optimal pictures of your heart. Therefore we ask that you do at least drink some water prior to coming in to hydrate your veins.    2) Carotid ultrasound: (in March = same day as the echocardiogram) - Your physician has requested that you have a carotid duplex. This test is an ultrasound of the carotid arteries in your neck. It looks at blood flow through these arteries that supply the brain with blood. Allow one hour for this exam. There are no restrictions or special instructions.    Follow-Up: At [facility], you and your health needs are our priority.  As part of our continuing mission to provide you with exceptional heart care, we have created designated Provider Care Teams.  These Care Teams include your primary  Cardiologist (physician) and Advanced Practice Providers (APPs -  Physician Assistants and Nurse Practitioners) who all work together to provide you with the care you need, when you need it.  We recommend signing up for the patient portal called "MyChart".  Sign up information is provided on this After Visit Summary.  MyChart is used to connect with patients for Virtual Visits (Telemedicine).  Patients are able to view lab/test results, encounter notes, upcoming appointments, etc.  Non-urgent messages can be sent to your provider as well.   To learn more about what you can do with MyChart, go to [email or web address].    Your next appointment:   6 month(s)  The format for your next appointment:   In Person  Provider:   Nelva Bush, MD    Other Instructions  Echocardiogram An echocardiogram is a test that uses sound waves (ultrasound) to produce images of the heart. Images from an echocardiogram can provide important information about: Heart size and shape. The size and thickness and movement of your heart's walls. Heart muscle function and strength. Heart valve function or if you have stenosis. Stenosis is when the heart valves are too narrow. If blood is flowing backward through the heart valves (regurgitation). A tumor or infectious growth around the heart valves. Areas of heart muscle that are not working well because of poor blood flow or injury from a heart attack. Aneurysm detection. An aneurysm is a weak or damaged part of an artery wall. The wall bulges out from the normal force  of blood pumping through the body. Tell a health care provider about: Any allergies you have. All medicines you are taking, including vitamins, herbs, eye drops, creams, and over-the-counter medicines. Any blood disorders you have. Any surgeries you have had. Any medical conditions you have. Whether you are pregnant or may be pregnant. What are the risks? Generally, this is a safe test.  However, problems may occur, including an allergic reaction to dye (contrast) that may be used during the test. What happens before the test? No specific preparation is needed. You may eat and drink normally. What happens during the test?  You will take off your clothes from the waist up and put on a hospital gown. Electrodes or electrocardiogram (ECG)patches may be placed on your chest. The electrodes or patches are then connected to a device that monitors your heart rate and rhythm. You will lie down on a table for an ultrasound exam. A gel will be applied to your chest to help sound waves pass through your skin. A handheld device, called a transducer, will be pressed against your chest and moved over your heart. The transducer produces sound waves that travel to your heart and bounce back (or "echo" back) to the transducer. These sound waves will be captured in real-time and changed into images of your heart that can be viewed on a video monitor. The images will be recorded on a computer and reviewed by your health care provider. You may be asked to change positions or hold your breath for a short time. This makes it easier to get different views or better views of your heart. In some cases, you may receive contrast through an IV in one of your veins. This can improve the quality of the pictures from your heart. The procedure may vary among health care providers and hospitals. What can I expect after the test? You may return to your normal, everyday life, including diet, activities, and medicines, unless your health care provider tells you not to do that. Follow these instructions at home: It is up to you to get the results of your test. Ask your health care provider, or the department that is doing the test, when your results will be ready. Keep all follow-up visits. This is important. Summary An echocardiogram is a test that uses sound waves (ultrasound) to produce images of the heart. Images  from an echocardiogram can provide important information about the size and shape of your heart, heart muscle function, heart valve function, and other possible heart problems. You do not need to do anything to prepare before this test. You may eat and drink normally. After the echocardiogram is completed, you may return to your normal, everyday life, unless your health care provider tells you not to do that. This information is not intended to replace advice given to you by your health care provider. Make sure you discuss any questions you have with your health care provider. Document Revised: 04/04/2021 Document Reviewed: 03/14/2020 Elsevier Patient Education  2022 [employer].

## 2021-09-12 NOTE — Progress Notes (Signed)
Office Visit    Patient Name: Jamie [name] Date of Encounter: 09/12/2021  Primary Care Provider:  Verdell Carmine., MD Primary Cardiologist:  Nelva Bush, MD  Chief Complaint    76 year old female with a history of CAD, hyperlipidemia, diabetes, COPD, carotid arterial disease, mild to moderate aortic stenosis, sleep apnea, obesity, and breast cancer status post bilateral cystectomies, who presents for follow-up related to HFpEF.  Past Medical History    Past Medical History:  Diagnosis Date   Anxiety    Aortic stenosis    a. 12/2014 Echo:  EF 60-65%, no RWMA, Gr 1 DD, Ao sclerosis, mild AS, mean 14 mmHg, MAC; b. 10/2020 Echo: EF 60-65%, no rwma, GrII DD, nl RV fxn, mildly dil LA, Mild-mod AS (mean grad 16/peak grad 28, peak vel 2.58ms, AVA 1.04cm^2 by VTI - moderate).   Asthma    Breast cancer ([facility] 08/2011   lt. breast ca   CAD (coronary artery disease)    a. 12/2011 NSTEMI/Cath: dLM 75%, pLAD 80%, RCA occluded, filled by collaterals from the LAD; b. 12/2011 CABG x4: LIMA-LAD, SVG-RCA, SVG-D1-->OM2; c. 08/2017 Cath: LM 70ost (FFR 0.82), LAD 50p (FFR 0.82), LCX nl, RCA 1077mTO, LIMA->LAD atretic, VG->D1 ok w/ 100 @ continuation to OM2, VG->dRCA ok-->Med rx.   Carotid artery disease ([facility]   a.  Pre-CABG Dopplers 5/13: Bilateral 40-59%; b.  Carotid [place]/8/93RICA 4081-01%LICA 6075-10%normal subclavians bilaterally;  c. Carotid [place]125/85RICA 4027-78%LICA 4024-23%normal subclavians; d. Carotid [place]1/17:  Stable 40-59% bilateral ICA stenoses; e. 07/2017 Carotid U/S: 1-39% bilat ICA stenoses.   Chronic heart failure with preserved ejection fraction (HFpEF) ([facility]   a. 10/2020 Echo: EF 60-65%, GrII DD.   COPD (chronic obstructive pulmonary disease) (HCC)    DM2 (diabetes mellitus, type 2) (HCC)    metformin   Gallstones    Symptomatic   HLD (hyperlipidemia)    Hx of cardiovascular stress test    Myoview 5/16:  small area of inf-lat ischemia, EF 70%; Low Risk >> try medical Rx   Hx  of migraines    Hypertension    Myocardial infarction ([facility]   Obesity    Shortness of breath    Sleep apnea    sleep study2-3 yrs ago lost weight afterward but gained back   UTI (lower urinary tract infection)    freq-bladder implant -removed   Past Surgical History:  Procedure Laterality Date   ABDOMINAL HYSTERECTOMY  1977   APPENDECTOMY     AXILLARY LYMPH NODE DISSECTION  10/18/2011   Procedure: AXILLARY LYMPH NODE DISSECTION;  Surgeon: PaMerrie RoofMD;  [facility] Service: General;  Laterality: Left;   BREAST RECONSTRUCTION  10/18/2011   Procedure: BREAST RECONSTRUCTION;  Surgeon: ClTheodoro KosDO;  [facility]: [facility] Service: Plastics;  Laterality: Bilateral;   bilateral breast reconstruction with bilateral tissue expander and placement of flex hd.   CARDIAC CATHETERIZATION     CESAREAN SECTION  04/1976   CHOLECYSTECTOMY N/A 08/17/2018   Procedure: LAPAROSCOPIC CHOLECYSTECTOMY;  Surgeon: BlCoralie KeensMD;  [facility] Service: General;  Laterality: N/A;   COLONOSCOPY WITH PROPOFOL N/A 06/20/2019   Procedure: COLONOSCOPY WITH PROPOFOL;  Surgeon: HuCarol AdaMD;  Location: [facility] Service: Endoscopy;  Laterality: N/A;   CORONARY ARTERY BYPASS GRAFT  12/13/2011   Procedure: CORONARY ARTERY BYPASS GRAFTING (CABG);  Surgeon: BrGaye PollackMD;  Location: [facility] Service:  Open Heart Surgery;  Laterality: N/A;  Times four using endoscopically harvested left greater saphenous vein and left internal mammary artery. Right greater saphenous vein attempted; not appropriate for vein harvest.   EYE SURGERY     bilateral cataract extraction    INTRAVASCULAR PRESSURE WIRE/FFR STUDY N/A 08/22/2017   Procedure: INTRAVASCULAR PRESSURE WIRE/FFR STUDY;  Surgeon: Nelva Bush, MD;  Location: [place] [facility];  Service: Cardiovascular;  Laterality: N/A;   LAPAROSCOPIC GASTRIC BANDING  2010   LEFT HEART CATHETERIZATION WITH CORONARY ANGIOGRAM N/A 12/11/2011   Procedure:  LEFT HEART CATHETERIZATION WITH CORONARY ANGIOGRAM;  Surgeon: Larey Dresser, MD;  [facility] C[facility] LAB;  Service: Cardiovascular;  Laterality: N/A;   LESION REMOVAL  04/09/2012   Procedure: LESION REMOVAL;  Surgeon: Theodoro Kos, DO;  Location: [place];  Service: Plastics;  Laterality: Bilateral;   MASTECTOMY W/ SENTINEL NODE BIOPSY  10/18/2011   Procedure: MASTECTOMY WITH SENTINEL LYMPH NODE BIOPSY;  Surgeon: Merrie Roof, MD;  Location: [place];  Service: General;  Laterality: Bilateral;  bilateral mastectomy and left sentinel node biopsy   NASAL SINUS SURGERY     POLYPECTOMY  06/20/2019   Procedure: POLYPECTOMY;  Surgeon: Carol Ada, MD;  Location: [facility] ENDOSCOPY;  Service: Endoscopy;;   PORT-A-CATH REMOVAL  04/09/2012   Procedure: REMOVAL PORT-A-CATH;  Surgeon: Merrie Roof, MD;  Location: [place];  Service: General;  Laterality: Right;   PORTACATH PLACEMENT  12/02/2011   Procedure: INSERTION PORT-A-CATH;  Surgeon: Merrie Roof, MD;  Location: [place];  Service: General;  Laterality: Right;   RIGHT/LEFT HEART CATH AND CORONARY/GRAFT ANGIOGRAPHY N/A 08/22/2017   Procedure: RIGHT/LEFT HEART CATH AND CORONARY/GRAFT ANGIOGRAPHY;  Surgeon: Nelva Bush, MD;  Location: [place] CV LAB;  Service: Cardiovascular;  Laterality: N/A;   TONSILLECTOMY     US ECHOCARDIOGRAPHY     at [place] Left 11/26/2013   Procedure: VIDEO BRONCHOSCOPY WITH ENDOBRONCHIAL NAVIGATION;  Surgeon: Collene Gobble, MD;  Location: [place];  Service: Thoracic;  Laterality: Left;    Allergies  Allergies  Allergen Reactions   Penicillins Nausea Only and Other (See Comments)    DID THE REACTION INVOLVE: Swelling of the face/tongue/throat, SOB, or low BP? No Sudden or severe rash/hives, skin peeling, or the inside of the mouth or nose? No Did it require medical treatment? #  #  #  YES  #  #  #  When did it last happen?  Unknown    Vicodin [Hydrocodone-Acetaminophen] Other (See Comments)    Syncope    Duloxetine Hcl Nausea Only   Sulfa Antibiotics Rash   Sulfamethoxazole Rash    History of Present Illness    76 year old female with above past medical history including CAD, hyperlipidemia, diabetes, HFpEF, COPD, carotid arterial disease, mild to moderate aortic stenosis, sleep apnea, obesity, and breast cancer status post bilateral mastectomies.  She previously suffered a non-STEMI in May 2013 with diagnostic catheterization revealing severe multivessel disease, she underwent CABG x4.  Stress testing was performed in May 2016 with finding of a small area of inferolateral ischemia.  She was medically managed.  She underwent repeat catheterization in January 2019 in the setting of dyspnea on exertion and this revealed an atretic LIMA to the LAD, patent vein graft to the diagonal with an occluded jump portion of the graft to the OM 2, and a patent vein graft to the RCA.  She had  a 70% ostial left main and 50% proximal LAD stenosis.  FFR was measured through these stenoses, and was normal at 0.82.  She was placed on isosorbide therapy.  Ms. Cottone is also being followed for mild to moderate aortic stenosis.  Most recent echo in March 2022 showed an EF of 60 to 65% with grade 2 diastolic dysfunction and mean/peak gradients of 16 and 28 mmHg respectively.  Aortic valve area is 1.04 cm by VTI, suggestive of moderate AS.  Ms. Hord was last seen in cardiology clinic in September 2022, at which time she was dealing with an upper respiratory illness which it was improving on prednisone.  She has since fully recovered from respiratory infection and notes that she has chronic, stable dyspnea on exertion.  Other than losing several family members and friends over the past 3 months and the emotional stress that that caused, she has been doing reasonably well from a cardiac standpoint.  She has not been experiencing any chest pain and  denies edema.  Further, she denies palpitations, PND, orthopnea, dizziness, syncope, or early satiety.  She has not required as needed Lasix in several months.  Home Medications    Current Outpatient Medications  Medication Sig Dispense Refill   albuterol (PROVENTIL) (2.5 MG/3ML) 0.083% nebulizer solution Inhale 3 mLs into the lungs every 6 (six) hours as needed for wheezing or shortness of breath.  2   aspirin EC 81 MG tablet Take 1 tablet (81 mg total) by mouth daily.     atorvastatin (LIPITOR) 80 MG tablet TAKE 1 TABLET BY MOUTH ONCE DAILY AT  6PM 90 tablet 0   buPROPion (WELLBUTRIN XL) 300 MG 24 hr tablet Take 300 mg by mouth daily.      cephALEXin (KEFLEX) 500 MG capsule Take 500 mg by mouth daily.      Cholecalciferol (VITAMIN D3) 1000 units CAPS Take 1,000 Units by mouth daily.      fluticasone (FLONASE) 50 MCG/ACT nasal spray Place 2 sprays into both nostrils daily.      furosemide (LASIX) 20 MG tablet TAKE 1 TABLET BY MOUTH ONCE DAILY AS NEEDED FOR  EDEMA  (SWELLING) 60 tablet 2   Glycopyrrolate-Formoterol 9-4.8 MCG/ACT AERO Inhale 2 puffs into the lungs daily.     isosorbide mononitrate (IMDUR) 60 MG 24 hr tablet Take 1 tablet by mouth twice daily 180 tablet 0   lisinopril (ZESTRIL) 5 MG tablet Take 1 tablet by mouth once daily 90 tablet 0   loratadine (CLARITIN) 10 MG tablet Take 10 mg by mouth daily.     metFORMIN (GLUCOPHAGE) 500 MG tablet Take 500 mg by mouth daily with breakfast.     metoprolol tartrate (LOPRESSOR) 25 MG tablet Take 1 tablet by mouth twice daily 180 tablet 2   Multiple Vitamin (MULTI VITAMIN DAILY PO) Take 1 tablet by mouth daily.     nitroGLYCERIN (NITROSTAT) 0.4 MG SL tablet Place 1 tablet (0.4 mg total) under the tongue every 5 (five) minutes as needed. 25 tablet 3   Polyethyl Glycol-Propyl Glycol 0.4-0.3 % SOLN Place 1 drop into both eyes 4 (four) times daily as needed (for dry eyes).      No current facility-administered medications for this visit.      Review of Systems    Some emotional distress in the setting of losing multiple friends and family over the holidays.  She has chronic dyspnea on exertion, which has been stable.  She denies chest pain, palpitations, PND, orthopnea, dizziness, syncope, edema,  or early satiety.  All other systems reviewed and are otherwise negative except as noted above.    Physical Exam    VS:  BP 134/60 (BP Location: Right Wrist, Patient Position: Sitting, Cuff Size: Normal)    Pulse 60    Ht _0  (1.727 m)    Wt 250 lb 2 oz (113.5 kg)    SpO2 97%    BMI 38.03 kg/m  , BMI Body mass index is 38.03 kg/m.     GEN: Obese, in no acute distress. HEENT: normal. Neck: Supple, no JVD.  Bilateral bruits versus radiated AS murmur.   Cardiac: RRR, 3/6 SEM @ the upper sternal borders and radiating to the neck bilat, no rubs or gallops. No clubbing, cyanosis, edema.  Radials/PT 2+ and equal bilaterally.  Respiratory:  Respirations regular and unlabored, clear to auscultation bilaterally. GI: Soft, nontender, nondistended, BS + x 4. MS: no deformity or atrophy. Skin: warm and dry, no rash. Neuro:  Strength and sensation are intact. Psych: Normal affect.  Accessory Clinical Findings    ECG personally reviewed by me today -regular sinus rhythm, 60- no acute changes.  Labs dated June 08, 2021 in Care Everywhere  Sodium 142, potassium 4.1, chloride 107, CO2 28, BUN 15, creatinine 1.17, glucose 113 Total protein 6.2, albumin 3.7, calcium 9.1 Total bilirubin 0.6, alkaline phosphatase 63, AST 20, ALT 24 Total cholesterol 118, triglycerides 224, HDL 39, LDL 60 Hemoglobin A1c 6.0  Assessment & Plan    1.  Chronic HFpEF: Most recent echo in March 2022 with an EF of 60 to 65% and grade 2 diastolic dysfunction.  Mild to moderate aortic stenosis also noted.  Patient has been doing well since her last visit in September.  She has chronic, stable dyspnea on exertion but has not had any lower extremity swelling and is  euvolemic on exam today.  Weight is stable.  Heart rate and blood pressure stable.  Creatinine stable in November.  Continue as needed Lasix, which she says she typically only needs during the summer months.  2.  CAD: Status post CABG x4 in May 2013 with atretic LIMA to the LAD, patent vein graft to the distal RCA, and patent vein graft to the D1 on catheterization in 2019.  The continuation graft to the OM 2 (from the D1) was occluded.  She has not been experiencing any chest pain.  She has chronic, stable dyspnea exertion.  She remains on aspirin, statin, nitrate, beta-blocker, and ACE inhibitor therapy.  3.  Mild to moderate aortic stenosis: Echo in March 2022 showed an aortic valve area of 1.4 cm.  3/6 systolic murmur on examination.  Chronic dyspnea without chest pain or presyncope/syncope.  Plan to follow-up echo next month.  4.  Carotid arterial disease: Patient with history of moderate bilateral carotid arterial stenoses which had been followed with ultrasounds in the past.  Last ultrasound was in 2018 showing 1 to 39% bilateral ICA stenoses with recommendation for follow-up in 2 years.  She has not had any follow-up and I will arrange for carotid ultrasound on the same day that she has her echo in March.  She remains on aspirin and statin therapy.  5.  COPD: No active wheezing today.  She remains on inhaler therapy per her primary care provider.  6.  Disposition: Follow-up echocardiogram and carotid ultrasound in March.  Follow-up in clinic in 4 to 6 months or sooner if necessary.  Murray Hodgkins, NP 09/12/2021, 4:33 PM

## 2021-10-02 ENCOUNTER — Other Ambulatory Visit: Payer: Self-pay | Admitting: Internal Medicine

## 2021-10-23 ENCOUNTER — Ambulatory Visit (INDEPENDENT_AMBULATORY_CARE_PROVIDER_SITE_OTHER): Payer: Medicare Other

## 2021-10-23 ENCOUNTER — Other Ambulatory Visit: Payer: Self-pay

## 2021-10-23 DIAGNOSIS — I6523 Occlusion and stenosis of bilateral carotid arteries: Secondary | ICD-10-CM

## 2021-10-23 DIAGNOSIS — I350 Nonrheumatic aortic (valve) stenosis: Secondary | ICD-10-CM

## 2021-10-23 LAB — ECHOCARDIOGRAM COMPLETE
AR max vel: 1.46 cm2
AV Area VTI: 1.54 cm2
AV Area mean vel: 1.57 cm2
AV Mean grad: 20 mmHg
AV Peak grad: 38.4 mmHg
Ao pk vel: 3.1 m/s
Area-P 1/2: 2.73 cm2

## 2021-10-23 MED ORDER — PERFLUTREN LIPID MICROSPHERE
1.0000 mL | INTRAVENOUS | Status: AC | PRN
  Administered 2021-10-23: 2 mL via INTRAVENOUS

## 2021-10-25 ENCOUNTER — Telehealth: Payer: Self-pay | Admitting: Internal Medicine

## 2021-10-25 ENCOUNTER — Telehealth: Payer: Self-pay | Admitting: *Deleted

## 2021-10-25 DIAGNOSIS — I2510 Atherosclerotic heart disease of native coronary artery without angina pectoris: Secondary | ICD-10-CM

## 2021-10-25 DIAGNOSIS — I6523 Occlusion and stenosis of bilateral carotid arteries: Secondary | ICD-10-CM

## 2021-10-25 NOTE — Telephone Encounter (Signed)
Reviewed results and recommendations with patient. She verbalized understanding with no further questions at this time. Orders entered for repeat testing in 1 year.  ?

## 2021-10-25 NOTE — Telephone Encounter (Signed)
-----   Message from Theora Gianotti, NP sent at 10/24/2021 11:46 AM EDT ----- ?Normal heat squeezing function with moderate stiffness of the left ventricle, which is similar to echo last year.  The Aortic valve is moderately tight/stenosed, which has progressed only slightly since her echo last year.  We will continue to follow her aortic stenosis with annual echocardiograms. ?

## 2021-10-25 NOTE — Telephone Encounter (Signed)
See other telephone encounter & result note. Spoke with patient and this call was prior to our conversation. She had no further needs.  ?

## 2021-10-25 NOTE — Telephone Encounter (Signed)
Patient calling for test results. °

## 2021-10-25 NOTE — Telephone Encounter (Signed)
-----   Message from Theora Gianotti, NP sent at 10/24/2021  5:30 PM EDT ----- ?Stable 1-39% bilateral internal carotid artery stenoses - similar to what was seen in 07/2017.  The common carotids and R external carotids were <50% stenosed, while the L external carotid was > 50% stenosed.  Continue aspirin and atorvastatin.  Follow-up study in 1 year. ?

## 2021-11-05 ENCOUNTER — Other Ambulatory Visit: Payer: Self-pay | Admitting: Internal Medicine

## 2021-12-03 ENCOUNTER — Other Ambulatory Visit: Payer: Self-pay | Admitting: Internal Medicine

## 2022-01-19 ENCOUNTER — Other Ambulatory Visit: Payer: Self-pay | Admitting: Internal Medicine

## 2022-01-27 ENCOUNTER — Other Ambulatory Visit: Payer: Self-pay | Admitting: Internal Medicine

## 2022-02-22 ENCOUNTER — Other Ambulatory Visit: Payer: Self-pay | Admitting: Nurse Practitioner

## 2022-02-25 ENCOUNTER — Other Ambulatory Visit: Payer: Self-pay | Admitting: Internal Medicine

## 2022-03-06 ENCOUNTER — Ambulatory Visit (INDEPENDENT_AMBULATORY_CARE_PROVIDER_SITE_OTHER): Payer: Medicare Other | Admitting: Internal Medicine

## 2022-03-06 ENCOUNTER — Encounter: Payer: Self-pay | Admitting: Internal Medicine

## 2022-03-06 VITALS — BP 154/72 | HR 65 | Ht 65.0 in | Wt 255.0 lb

## 2022-03-06 DIAGNOSIS — I25118 Atherosclerotic heart disease of native coronary artery with other forms of angina pectoris: Secondary | ICD-10-CM

## 2022-03-06 DIAGNOSIS — I350 Nonrheumatic aortic (valve) stenosis: Secondary | ICD-10-CM

## 2022-03-06 DIAGNOSIS — E785 Hyperlipidemia, unspecified: Secondary | ICD-10-CM

## 2022-03-06 DIAGNOSIS — E1169 Type 2 diabetes mellitus with other specified complication: Secondary | ICD-10-CM

## 2022-03-06 DIAGNOSIS — I10 Essential (primary) hypertension: Secondary | ICD-10-CM

## 2022-03-06 DIAGNOSIS — I5032 Chronic diastolic (congestive) heart failure: Secondary | ICD-10-CM | POA: Diagnosis not present

## 2022-03-06 NOTE — Progress Notes (Signed)
Follow-up Outpatient Visit Date: 03/06/2022  Primary Care Provider: Verdell Carmine., MD [address] [place] [address]  Chief Complaint: Follow-up coronary artery disease and aortic stenosis  HPI:  Jamie [name] is a 76 y.o. female with history of coronary artery disease status post CABG (6226), diastolic heart failure, mild-moderate aortic stenosis, hyperlipidemia, carotid artery stenosis, breast cancer status post bilateral mastectomies, diabetes mellitus, COPD, sleep apnea, and obesity, who presents for follow-up of coronary artery disease and HFpEF.  She was last seen in our office in February by Jamie Bayley, Jamie [name], at which time she reported stable chronic exertional dyspnea.  She denied chest pain.  Subsequent echo showed normal LVEF with grade 2 diastolic dysfunction and moderate AS (mean gradient 20 mmHg, AVA 1.5 cm^2).  Carotid Doppler showed stable mild bilateral ICA disease.  Today, Jamie [name] reports that she feels fairly well.  She has some exertional dyspnea that seems to be accentuated by the summer heat.  It is similar to past years.  She has been trying to push herself a bit more this year.  She tries to walk at [facility] at least a few days a week.  She denies chest pain.  She is using as needed furosemide this summer due to some leg swelling associated with the heat.  Her leg swelling is otherwise been well controlled.  She has not had any palpitations or lightheadedness.  She does not monitor her blood pressure regularly at home.  She is minimizing her sodium intake.  --------------------------------------------------------------------------------------------------  Past Medical History:  Diagnosis Date   Anxiety    Aortic stenosis    a. 12/2014 Echo:  EF 60-65%, no RWMA, Gr 1 DD, Ao sclerosis, mild AS, mean 14 mmHg, MAC; b. 10/2020 Echo: EF 60-65%, no rwma, GrII DD, nl RV fxn, mildly dil LA, Mild-mod AS (mean grad 16/peak grad 28, peak vel 2.1ms, AVA 1.04cm^2 by VTI -  moderate).   Asthma    Breast cancer ([facility] 08/2011   lt. breast ca   CAD (coronary artery disease)    a. 12/2011 NSTEMI/Cath: dLM 75%, pLAD 80%, RCA occluded, filled by collaterals from the LAD; b. 12/2011 CABG x4: LIMA-LAD, SVG-RCA, SVG-D1-->OM2; c. 08/2017 Cath: LM 70ost (FFR 0.82), LAD 50p (FFR 0.82), LCX nl, RCA 1050mTO, LIMA->LAD atretic, VG->D1 ok w/ 100 @ continuation to OM2, VG->dRCA ok-->Med rx.   Carotid artery disease ([facility]   a.  Pre-CABG Dopplers 5/13: Bilateral 40-59%; b.  Carotid [place]/3/33RICA 4054-56%LICA 6025-63%normal subclavians bilaterally;  c. Carotid [place]189/37RICA 4034-28%LICA 4076-81%normal subclavians; d. Carotid [place]1/17:  Stable 40-59% bilateral ICA stenoses; e. 07/2017 Carotid U/S: 1-39% bilat ICA stenoses.   Chronic heart failure with preserved ejection fraction (HFpEF) ([facility]   a. 10/2020 Echo: EF 60-65%, GrII DD.   COPD (chronic obstructive pulmonary disease) (HCC)    DM2 (diabetes mellitus, type 2) (HCC)    metformin   Gallstones    Symptomatic   HLD (hyperlipidemia)    Hx of cardiovascular stress test    Myoview 5/16:  small area of inf-lat ischemia, EF 70%; Low Risk >> try medical Rx   Hx of migraines    Hypertension    [place]ocardial infarction ([facility]   Obesity    Shortness of breath    Sleep apnea    sleep study2-3 yrs ago lost weight afterward but gained back   UTI (lower urinary tract infection)    freq-bladder implant -removed  Past Surgical History:  Procedure Laterality Date   ABDOMINAL HYSTERECTOMY  1977   APPENDECTOMY     AXILLARY LYMPH NODE DISSECTION  10/18/2011   Procedure: AXILLARY LYMPH NODE DISSECTION;  Surgeon: Merrie Roof, MD;  Location: [place];  Service: General;  Laterality: Left;   BREAST RECONSTRUCTION  10/18/2011   Procedure: BREAST RECONSTRUCTION;  Surgeon: Theodoro Kos, DO;  Location: [place];  Service: Plastics;  Laterality: Bilateral;   bilateral breast reconstruction with bilateral tissue expander and placement of flex hd.    CARDIAC CATHETERIZATION     CESAREAN SECTION  04/1976   CHOLECYSTECTOMY N/A 08/17/2018   Procedure: LAPAROSCOPIC CHOLECYSTECTOMY;  Surgeon: Coralie Keens, MD;  Location: [place];  Service: General;  Laterality: N/A;   COLONOSCOPY WITH PROPOFOL N/A 06/20/2019   Procedure: COLONOSCOPY WITH PROPOFOL;  Surgeon: Carol Ada, MD;  Location: [place];  Service: Endoscopy;  Laterality: N/A;   CORONARY ARTERY BYPASS GRAFT  12/13/2011   Procedure: CORONARY ARTERY BYPASS GRAFTING (CABG);  Surgeon: Gaye Pollack, MD;  Location: [place];  Service: Open Heart Surgery;  Laterality: N/A;  Times four using endoscopically harvested left greater saphenous vein and left internal mammary artery. Right greater saphenous vein attempted; not appropriate for vein harvest.   EYE SURGERY     bilateral cataract extraction    INTRAVASCULAR PRESSURE WIRE/FFR STUDY N/A 08/22/2017   Procedure: INTRAVASCULAR PRESSURE WIRE/FFR STUDY;  Surgeon: Nelva Bush, MD;  Location: [place] CV LAB;  Service: Cardiovascular;  Laterality: N/A;   LAPAROSCOPIC GASTRIC BANDING  2010   LEFT HEART CATHETERIZATION WITH CORONARY ANGIOGRAM N/A 12/11/2011   Procedure: LEFT HEART CATHETERIZATION WITH CORONARY ANGIOGRAM;  Surgeon: Larey Dresser, MD;  Location[facility] CATH LAB;  Service: Cardiovascular;  Laterality: N/A;   LESION REMOVAL  04/09/2012   Procedure: LESION REMOVAL;  Surgeon: Theodoro Kos, DO;  Location: [place];  Service: Plastics;  Laterality: Bilateral;   MASTECTOMY W/ SENTINEL NODE BIOPSY  10/18/2011   Procedure: MASTECTOMY WITH SENTINEL LYMPH NODE BIOPSY;  Surgeon: Merrie Roof, MD;  Location: [place];  Service: General;  Laterality: Bilateral;  bilateral mastectomy and left sentinel node biopsy   NASAL SINUS SURGERY     POLYPECTOMY  06/20/2019   Procedure: POLYPECTOMY;  Surgeon: Carol Ada, MD;  Location: [facility] ENDOSCOPY;  Service: Endoscopy;;   PORT-A-CATH REMOVAL  04/09/2012   Procedure: REMOVAL PORT-A-CATH;   Surgeon: Merrie Roof, MD;  Location: [place];  Service: General;  Laterality: Right;   PORTACATH PLACEMENT  12/02/2011   Procedure: INSERTION PORT-A-CATH;  Surgeon: Merrie Roof, MD;  Location: [place];  Service: General;  Laterality: Right;   RIGHT/LEFT HEART CATH AND CORONARY/GRAFT ANGIOGRAPHY N/A 08/22/2017   Procedure: RIGHT/LEFT HEART CATH AND CORONARY/GRAFT ANGIOGRAPHY;  Surgeon: Nelva Bush, MD;  Location: [place] CV LAB;  Service: Cardiovascular;  Laterality: N/A;   TONSILLECTOMY     US ECHOCARDIOGRAPHY     at [place] Left 11/26/2013   Procedure: VIDEO BRONCHOSCOPY WITH ENDOBRONCHIAL NAVIGATION;  Surgeon: Collene Gobble, MD;  Location: [facility];  Service: Thoracic;  Laterality: Left;    Current Meds  Medication Sig   albuterol (PROVENTIL) (2.5 MG/3ML) 0.083% nebulizer solution Inhale 3 mLs into the lungs every 6 (six) hours as needed for wheezing or shortness of breath.   aspirin EC 81 MG tablet Take 1 tablet (81 mg total) by mouth daily.   atorvastatin (LIPITOR)  80 MG tablet TAKE 1 TABLET BY MOUTH ONCE DAILY AT  6PM   cephALEXin (KEFLEX) 500 MG capsule Take 500 mg by mouth daily.    Cholecalciferol (VITAMIN D3) 1000 units CAPS Take 1,000 Units by mouth daily.    fluticasone (FLONASE) 50 MCG/ACT nasal spray Place 2 sprays into both nostrils daily.    furosemide (LASIX) 20 MG tablet TAKE 1 TABLET BY MOUTH ONCE DAILY AS NEEDED FOR  EDEMA  (SWELLING)   Glycopyrrolate-Formoterol 9-4.8 MCG/ACT AERO Inhale 2 puffs into the lungs daily.   isosorbide mononitrate (IMDUR) 60 MG 24 hr tablet Take 1 tablet by mouth twice daily   lisinopril (ZESTRIL) 5 MG tablet Take 1 tablet by mouth once daily   loratadine (CLARITIN) 10 MG tablet Take 10 mg by mouth daily.   metFORMIN (GLUCOPHAGE) 500 MG tablet Take 500 mg by mouth daily with breakfast.   metoprolol tartrate (LOPRESSOR) 25 MG tablet Take 1 tablet by mouth twice  daily   Multiple Vitamin (MULTI VITAMIN DAILY PO) Take 1 tablet by mouth daily.   nitroGLYCERIN (NITROSTAT) 0.4 MG SL tablet Place 1 tablet (0.4 mg total) under the tongue every 5 (five) minutes as needed.   Polyethyl Glycol-Propyl Glycol 0.4-0.3 % SOLN Place 1 drop into both eyes 4 (four) times daily as needed (for dry eyes).     Allergies: Penicillins, Vicodin [hydrocodone-acetaminophen], Duloxetine hcl, Sulfa antibiotics, and Sulfamethoxazole  Social History   Tobacco Use   Smoking status: Former    Packs/day: 2.00    Years: 40.00    Total pack years: 80.00    Types: Cigarettes    Quit date: 08/25/2006    Years since quitting: 15.5   Smokeless tobacco: Never  Vaping Use   Vaping Use: Never used  Substance Use Topics   Alcohol use: Yes    Comment: 1-2 times a year   Drug use: No    Family History  Problem Relation Age of Onset   Heart disease Mother    Heart disease Father    Breast cancer Maternal Aunt    Ovarian cancer Paternal Aunt    Breast cancer Maternal Aunt    Anesthesia problems Neg Hx    Hypotension Neg Hx    Malignant hyperthermia Neg Hx    Pseudochol deficiency Neg Hx     Review of Systems: A 12-system review of systems was performed and was negative except as noted in the HPI.  --------------------------------------------------------------------------------------------------  Physical Exam: BP (!) 154/72 (BP Location: Right Arm, Patient Position: Sitting, Cuff Size: Large)   Pulse 65   Ht _0  (1.651 m)   Wt 255 lb (115.7 kg)   SpO2 95%   BMI 42.43 kg/m  Repeat BP: 136/60  General:  NAD. Neck: No JVD or HJR. Lungs: Clear to auscultation bilaterally without wheezes or crackles. Heart: Regular rate and rhythm with 3/6 systolic murmur.  No rubs or gallops. Abdomen: Soft, nontender, nondistended. Extremities: Trace pretibial edema bilaterally.  EKG: Normal sinus rhythm with subtle inferior Q waves.  No significant change from prior tracing on  09/12/2021.  Lab Results  Component Value Date   WBC 5.9 04/04/2021   HGB 13.1 04/04/2021   HCT 39.5 04/04/2021   MCV 88 04/04/2021   PLT 191 04/04/2021    Lab Results  Component Value Date   NA 144 04/04/2021   K 4.7 04/04/2021   CL 106 04/04/2021   CO2 23 04/04/2021   BUN 18 04/04/2021   CREATININE 1.21 (H) 04/04/2021  GLUCOSE 134 (H) 04/04/2021   ALT 25 06/19/2019    Lab Results  Component Value Date   CHOL 131 03/30/2019   HDL 42 03/30/2019   LDLCALC 51 03/30/2019   TRIG 192 (H) 03/30/2019   CHOLHDL 3.1 03/30/2019    --------------------------------------------------------------------------------------------------  ASSESSMENT AND PLAN: Coronary artery disease with stable angina: Overall, Jamie [name] feels similar to prior years.  She has chronic exertional dyspnea but no chest pain.  We will continue her current antianginal regimen consisting of metoprolol and isosorbide mononitrate, as well as secondary prevention with aspirin and atorvastatin.  I will check a CBC, CMP, and lipid panel today.  Aortic stenosis: Echocardiogram earlier this year showed moderate aortic stenosis, slightly worse than on prior echo.  Chronic exertional dyspnea is likely multifactorial.  We will continue close clinical follow-up and consider repeating an echo in a year.  Chronic HFpEF: Jamie [name] appears grossly euvolemic on exam though her obesity limits clinical assessment.  She has stable NYHA class II-III symptoms.  LVEF was normal on echo earlier this year.  We will plan to continue as needed furosemide.  If her symptoms worsen, we could consider a trial of an SGLT2 inhibitor.  Hypertension: Blood pressure mildly elevated today with repeat reading of 136/68.  I have encouraged Jamie [name] to monitor her blood pressure at home and to alert [place] if it is consistently above our goal of 130/80.  I would favor escalation of lisinopril if blood pressure is suboptimally controlled going  forward.  Hyperlipidemia associated with type 2 diabetes mellitus: Continue atorvastatin 80 mg daily.  I will check a CMP and lipid panel today.  Ongoing management of DM per PCP.  Given history of HFpEF, addition of an SGLT2 inhibitor should be considered.  Morbid obesity: BMI > 40.  Encourage weight loss through diet and exercise.  Follow-up: Return to clinic in 6 months.  Nelva Bush, MD 03/06/2022 11:05 AM

## 2022-03-06 NOTE — Patient Instructions (Signed)
Medication Instructions:   Your physician recommends that you continue on your current medications as directed. Please refer to the Current Medication list given to you today.  *If you need a refill on your cardiac medications before your next appointment, please call your pharmacy*   Lab Work:  Today at the medical mall at [facility]: CBC, CMET, Lipid panel  -  Please go to the [place] at [place] in at the Registration Desk: 1st desk to the right, past the screening table  If you have labs (blood work) drawn today and your tests are completely normal, you will receive your results only by: [place] (if you have MyChart) OR A paper copy in the mail If you have any lab test that is abnormal or we need to change your treatment, we will call you to review the results.   Testing/Procedures:  None ordered   Follow-Up: At [facility], you and your health needs are our priority.  As part of our continuing mission to provide you with exceptional heart care, we have created designated Provider Care Teams.  These Care Teams include your primary Cardiologist (physician) and Advanced Practice Providers (APPs -  Physician Assistants and Nurse Practitioners) who all work together to provide you with the care you need, when you need it.  We recommend signing up for the patient portal called "MyChart".  Sign up information is provided on this After Visit Summary.  MyChart is used to connect with patients for Virtual Visits (Telemedicine).  Patients are able to view lab/test results, encounter notes, upcoming appointments, etc.  Non-urgent messages can be sent to your provider as well.   To learn more about what you can do with MyChart, go to [email or web address].    Your next appointment:   6 month(s)  The format for your next appointment:   In Person  Provider:   You may see Nelva Bush, MD or one of the following Advanced Practice Providers on your designated  Care Team:   Murray Hodgkins, NP Christell Aleenah, PA-C Cadence Kathlen Mody, [place]    Other Instructions  Please check you blood pressure at home about once a week and let us know if your BP is consistently greater than 130/80.  Important Information About Sugar

## 2022-05-13 ENCOUNTER — Other Ambulatory Visit: Payer: Self-pay | Admitting: Internal Medicine

## 2022-05-13 NOTE — Telephone Encounter (Signed)
Rx refill sent to pharmacy. 

## 2022-06-14 ENCOUNTER — Other Ambulatory Visit: Payer: Self-pay | Admitting: Internal Medicine

## 2022-06-28 ENCOUNTER — Other Ambulatory Visit: Payer: Self-pay | Admitting: Internal Medicine

## 2022-07-31 ENCOUNTER — Other Ambulatory Visit: Payer: Self-pay

## 2022-07-31 ENCOUNTER — Ambulatory Visit
Admission: EM | Admit: 2022-07-31 | Discharge: 2022-07-31 | Payer: Medicare Other | Attending: Physician Assistant | Admitting: Physician Assistant

## 2022-07-31 ENCOUNTER — Emergency Department (HOSPITAL_COMMUNITY)
Admission: EM | Admit: 2022-07-31 | Discharge: 2022-07-31 | Discharge status: Against Medical Advice | Payer: Medicare Other | Attending: Emergency Medicine | Admitting: Emergency Medicine

## 2022-07-31 ENCOUNTER — Encounter (HOSPITAL_COMMUNITY): Payer: Self-pay | Admitting: *Deleted

## 2022-07-31 ENCOUNTER — Emergency Department (HOSPITAL_COMMUNITY): Payer: Medicare Other

## 2022-07-31 DIAGNOSIS — I252 Old myocardial infarction: Secondary | ICD-10-CM | POA: Diagnosis not present

## 2022-07-31 DIAGNOSIS — M79601 Pain in right arm: Secondary | ICD-10-CM | POA: Diagnosis not present

## 2022-07-31 DIAGNOSIS — Z951 Presence of aortocoronary bypass graft: Secondary | ICD-10-CM | POA: Diagnosis not present

## 2022-07-31 DIAGNOSIS — Z5321 Procedure and treatment not carried out due to patient leaving prior to being seen by health care provider: Secondary | ICD-10-CM | POA: Diagnosis not present

## 2022-07-31 DIAGNOSIS — J449 Chronic obstructive pulmonary disease, unspecified: Secondary | ICD-10-CM | POA: Insufficient documentation

## 2022-07-31 DIAGNOSIS — M79602 Pain in left arm: Secondary | ICD-10-CM | POA: Insufficient documentation

## 2022-07-31 DIAGNOSIS — R0602 Shortness of breath: Secondary | ICD-10-CM | POA: Diagnosis present

## 2022-07-31 DIAGNOSIS — J45909 Unspecified asthma, uncomplicated: Secondary | ICD-10-CM | POA: Insufficient documentation

## 2022-07-31 DIAGNOSIS — R0609 Other forms of dyspnea: Secondary | ICD-10-CM

## 2022-07-31 DIAGNOSIS — R531 Weakness: Secondary | ICD-10-CM

## 2022-07-31 NOTE — ED Provider Triage Note (Signed)
Emergency Medicine Provider Triage Evaluation Note  JO BOOZE , a 76 y.o. female  was evaluated in triage.  Pt complains of bilateral arm pain and weakness with dyspnea on exertion starting this morning. Went to UC, was found to be 80% on room air, placed on 2L and improved significantly. Hx of CABG x4 and NSTEMI, COPD, asthma  Review of Systems  Positive: Bilateral arm pain and weakness, SOB, nausea Negative: Cough, vomiting, CP  Physical Exam  BP (!) 127/57   Pulse 72   Temp 98.2 F (36.8 C)   Resp 16   SpO2 99%  Gen:   Awake, no distress   Resp:  Normal effort  MSK:   Moves extremities without difficulty  Other:    Medical Decision Making  Medically screening exam initiated at 12:37 PM.  Appropriate orders placed.  CINDERELLA CHRISTOFFERSEN was informed that the remainder of the evaluation will be completed by another provider, this initial triage assessment does not replace that evaluation, and the importance of remaining in the ED until their evaluation is complete.  Workup initiated   Kateri Plummer, PA-C 07/31/22 1237

## 2022-07-31 NOTE — ED Notes (Addendum)
Pt daughter said no blood work has been drawn. Triage is aware that blood  needs to be drawn. I went triage to make them aware. Triage is back up . Pt family member curse me said this is "BS" . Pt said she will be suing.Marland Kitchen

## 2022-07-31 NOTE — ED Notes (Signed)
Patient is being discharged from the Urgent Care and sent to the Emergency Department via ems . Per Wells Guiles, patient is in need of higher level of care due to Dyspnea. Patient is aware and verbalizes understanding of plan of care.  Vitals:   07/31/22 1032 07/31/22 1057  BP: 131/71   Pulse: 77   Resp: (!) 24   Temp: 97.8 F (36.6 C)   SpO2: 91% 93%

## 2022-07-31 NOTE — ED Triage Notes (Signed)
Pt arrived via ems from [facility] with shortness of breath and bilateral arm pain/weakness. O2 sats originally in 80s and placed on 02/2L and increased to 94% which helped improve the SOB. Pt has history of same symptoms when she last had a NSTEMI and had CABG x4. Pt has had bilateral mastectomy and no BPs in Right arm. PT states she just feels tired

## 2022-07-31 NOTE — ED Provider Notes (Signed)
[facility]    CSN: [id number] Arrival date & time: 07/31/22  1016      History   Chief Complaint Chief Complaint  Patient presents with   Cough    HPI Jamie [name] is a 76 y.o. female.   Patient here today for evaluation of shortness of breath,  nausea, diarrhea, "numbness inside", bilateral arm weakness, fatigue that started today. She reports that she was evaluated by EMS and family asked if they should be seen in the ED and they said "in my opinion- no" so family brought her to urgent care for assessment. EMS had mentioned she might need labs but reassured patient they did not feel her symptoms were related to her heart based on her EKG. She has PMH significant for prior MI, CABG x 4, CHF, COPD. Daughter reports that her son recently had GI bug- not sure if this is what her mother is experiencing but patient does report cough as well.  Upon entering urgent care patient started to feel more lightheaded- initially had planned to wait in car but then was unable to ambulate to car due to feeling she might pass out. She states with her last MI (10 years ago- NSTEMI) that she did not have chest pain, only bilateral arm pain.   The history is provided by the patient and a relative (daughter).  Cough Associated symptoms: shortness of breath   Associated symptoms: no chest pain, no chills, no eye discharge and no fever     Past Medical History:  Diagnosis Date   Anxiety    Aortic stenosis    a. 12/2014 Echo:  EF 60-65%, no RWMA, Gr 1 DD, Ao sclerosis, mild AS, mean 14 mmHg, MAC; b. 10/2020 Echo: EF 60-65%, no rwma, GrII DD, nl RV fxn, mildly dil LA, Mild-mod AS (mean grad 16/peak grad 28, peak vel 2.33ms, AVA 1.04cm^2 by VTI - moderate).   Asthma    [place]ast cancer ([place] 08[place]   lt. breast ca   CAD (coronary artery disease)    a. 12/2011 NSTEMI/Cath: dLM 75%, pLAD 80%, RCA occluded, filled by collaterals from the LAD; b. 12/2011 CABG x4: LIMA-LAD, SVG-RCA, SVG-D1-->OM2; c.  08/2017 Cath: LM 70ost (FFR 0.82), LAD 50p (FFR 0.82), LCX nl, RCA 1038mTO, LIMA->LAD atretic, VG->D1 ok w/ 100 @ continuation to OM2, VG->dRCA ok-->Med rx.   Carotid artery disease ([facility]   a.  Pre-CABG Dopplers 5/13: Bilateral 40-59%; b.  Carotid [place]/6/73RICA 4041-93%LICA 6079-02%normal subclavians bilaterally;  c. Carotid [place]140/97RICA 4035-32%LICA 4099-24%normal subclavians; d. Carotid [place]1/17:  Stable 40-59% bilateral ICA stenoses; e. 07/2017 Carotid U/S: 1-39% bilat ICA stenoses.   Chronic heart failure with preserved ejection fraction (HFpEF) ([facility]   a. 10/2020 Echo: EF 60-65%, GrII DD.   COPD (chronic obstructive pulmonary disease) (HCC)    DM2 (diabetes mellitus, type 2) (HCC)    metformin   Gallstones    Symptomatic   HLD (hyperlipidemia)    Hx of cardiovascular stress test    Myoview 5/16:  small area of inf-lat ischemia, EF 70%; Low Risk >> try medical Rx   Hx of migraines    Hypertension    Myocardial infarction ([facility]CLake [place]   Obesity    Shortness of breath    Sleep apnea    sleep study2-3 yrs ago lost weight afterward but gained back   UTI (lower urinary tract infection)    freq-bladder implant -removed    Patient  Active Problem List   Diagnosis Date Noted   History of MI (myocardial infarction) 07/31/2022   Hyperlipidemia associated with type 2 diabetes mellitus ([place]) 11/09/2020   Acute GI bleeding 06/19/2019   Acute blood loss anemia 06/19/2019   Symptomatic cholelithiasis 06/22/2018   Shortness of breath 07/18/2017   Chronic heart failure with preserved ejection fraction (HFpEF) ([place]) 07/18/2017   Essential hypertension 07/18/2017   Malignant neoplasm of overlapping sites of left breast in female, estrogen receptor positive ([place]) 02/16/2016   Osteopenia 02/20/2015   Moderate aortic stenosis 01/26/2015   Fatty liver disease, nonalcoholic 40/76/8088   Nodule of left lung 11/26/2013   Diabetes mellitus, type 2 ([place]) 08/16/2013   OSA (obstructive sleep apnea)  01/28/2013   Lymphedema of arm 05/07/2012   Acquired absence of bilateral breasts and nipples 04/10/2012   Carotid stenosis 03/11/2012   Preoperative evaluation of a medical condition to rule out surgical contraindications (TAR required) 03/11/2012   Constipation 12/12/2011   CHF (congestive heart failure) ([place]) 12/10/2011   COPD exacerbation ([place]) 12/10/2011   Hyperlipidemia LDL goal <70 12/10/2011   Morbid obesity ([place]) 12/10/2011   Coronary artery disease of native artery of native heart with stable angina pectoris ([place]) 12/10/2011    Past Surgical History:  Procedure Laterality Date   ABDOMINAL HYSTERECTOMY  1977   APPENDECTOMY     AXILLARY LYMPH NODE DISSECTION  10/18/2011   Procedure: AXILLARY LYMPH NODE DISSECTION;  Surgeon: Merrie Roof, MD;  Location: [place];  Service: General;  Laterality: Left;   BREAST RECONSTRUCTION  10/18/2011   Procedure: BREAST RECONSTRUCTION;  Surgeon: Theodoro Kos, DO;  Location: [place];  Service: Plastics;  Laterality: Bilateral;   bilateral breast reconstruction with bilateral tissue expander and placement of flex hd.   CARDIAC CATHETERIZATION     CESAREAN SECTION  04/1976   CHOLECYSTECTOMY N/A 08/17/2018   Procedure: LAPAROSCOPIC CHOLECYSTECTOMY;  Surgeon: Coralie Keens, MD;  Location: [place];  Service: General;  Laterality: N/A;   COLONOSCOPY WITH PROPOFOL N/A 06/20/2019   Procedure: COLONOSCOPY WITH PROPOFOL;  Surgeon: Carol Ada, MD;  Location: [place];  Service: Endoscopy;  Laterality: N/A;   CORONARY ARTERY BYPASS GRAFT  12/13/2011   Procedure: CORONARY ARTERY BYPASS GRAFTING (CABG);  Surgeon: Gaye Pollack, MD;  Location: [place];  Service: Open Heart Surgery;  Laterality: N/A;  Times four using endoscopically harvested left greater saphenous vein and left internal mammary artery. Right greater saphenous vein attempted; not appropriate for vein harvest.   EYE SURGERY     bilateral cataract extraction    INTRAVASCULAR PRESSURE WIRE/FFR  STUDY N/A 08/22/2017   Procedure: INTRAVASCULAR PRESSURE WIRE/FFR STUDY;  Surgeon: Nelva Bush, MD;  Location: [place] CV LAB;  Service: Cardiovascular;  Laterality: N/A;   LAPAROSCOPIC GASTRIC BANDING  2010   LEFT HEART CATHETERIZATION WITH CORONARY ANGIOGRAM N/A 12/11/2011   Procedure: LEFT HEART CATHETERIZATION WITH CORONARY ANGIOGRAM;  Surgeon: Larey Dresser, MD;  [facility] C[facility] LAB;  Service: Cardiovascular;  Laterality: N/A;   LESION REMOVAL  04/09/2012   Procedure: LESION REMOVAL;  Surgeon: Theodoro Kos, DO;  Location: [place];  Service: Plastics;  Laterality: Bilateral;   MASTECTOMY W/ SENTINEL NODE BIOPSY  10/18/2011   Procedure: MASTECTOMY WITH SENTINEL LYMPH NODE BIOPSY;  Surgeon: Merrie Roof, MD;  Location: [place];  Service: General;  Laterality: Bilateral;  bilateral mastectomy and left sentinel node biopsy   NASAL SINUS SURGERY     POLYPECTOMY  06/20/2019  Procedure: POLYPECTOMY;  Surgeon: Carol Ada, MD;  Location[facility] ENDOSCOPY;  Service: Endoscopy;;   PORT-A-CATH REMOVAL  04/09/2012   Procedure: REMOVAL PORT-A-CATH;  Surgeon: Merrie Roof, MD;  Location: [place];  Service: General;  Laterality: Right;   PORTACATH PLACEMENT  12/02/2011   Procedure: INSERTION PORT-A-CATH;  Surgeon: Merrie Roof, MD;  Location: [place];  Service: General;  Laterality: Right;   RIGHT/LEFT HEART CATH AND CORONARY/GRAFT ANGIOGRAPHY N/A 08/22/2017   Procedure: RIGHT/LEFT HEART CATH AND CORONARY/GRAFT ANGIOGRAPHY;  Surgeon: Nelva Bush, MD;  Location: [place] CV LAB;  Service: Cardiovascular;  Laterality: N/A;   TONSILLECTOMY     US ECHOCARDIOGRAPHY     at [place] Left 11/26/2013   Procedure: VIDEO BRONCHOSCOPY WITH ENDOBRONCHIAL NAVIGATION;  Surgeon: Collene Gobble, MD;  Location: [place];  Service: Thoracic;  Laterality: Left;    OB History   No obstetric history on file.       Home Medications    Prior to Admission medications   Medication Sig Start Date End Date Taking? Authorizing Provider  albuterol (PROVENTIL) (2.5 MG/3ML) 0.083% nebulizer solution Inhale 3 mLs into the lungs every 6 (six) hours as needed for wheezing or shortness of breath. 02/19/18   [provider]  aspirin EC 81 MG tablet Take 1 tablet (81 mg total) by mouth daily. 04/26/16   Richardson Dopp T, PA-C  atorvastatin (LIPITOR) 80 MG tablet TAKE 1 TABLET BY MOUTH ONCE DAILY AT  [facility] 05/13/22   End, Harrell Gave, MD  buPROPion (WELLBUTRIN XL) 300 MG 24 hr tablet Take 300 mg by mouth daily.  Patient not taking: Reported on 03/06/2022 06/20/17   [provider]  cephALEXin (KEFLEX) 500 MG capsule Take 500 mg by mouth daily.  03/05/17   [provider]  Cholecalciferol (VITAMIN D3) 1000 units CAPS Take 1,000 Units by mouth daily.     [provider]  fluticasone (FLONASE) 50 MCG/ACT nasal spray Place 2 sprays into both nostrils daily.  08/14/13   [provider]  furosemide (LASIX) 20 MG tablet TAKE 1 TABLET BY MOUTH ONCE DAILY AS NEEDED FOR  EDEMA  (SWELLING) 01/21/22   End, Harrell Gave, MD  Glycopyrrolate-Formoterol 9-4.8 MCG/ACT AERO Inhale 2 puffs into the lungs daily.    [provider]  isosorbide mononitrate (IMDUR) 60 MG 24 hr tablet Take 1 tablet by mouth twice daily 07/01/22   End, Harrell Gave, MD  lisinopril (ZESTRIL) 5 MG tablet Take 1 tablet by mouth once daily 10/02/21   End, Harrell Gave, MD  loratadine (CLARITIN) 10 MG tablet Take 10 mg by mouth daily.    [provider]  metFORMIN (GLUCOPHAGE) 500 MG tablet Take 500 mg by mouth daily with breakfast.    [provider]  metoprolol tartrate (LOPRESSOR) 25 MG tablet Take 1 tablet by mouth twice daily 06/14/22   End, Harrell Gave, MD  Multiple Vitamin (MULTI VITAMIN DAILY PO) Take 1 tablet by mouth daily.    [provider]  nitroGLYCERIN (NITROSTAT) 0.4 MG SL tablet  Place 1 tablet (0.4 mg total) under the tongue every 5 (five) minutes as needed. 06/22/18   End, Harrell Gave, MD  Polyethyl Glycol-Propyl Glycol 0.4-0.3 % SOLN Place 1 drop into both eyes 4 (four) times daily as needed (for dry eyes).     [provider]    Family History Family History  Problem Relation Age of Onset   Heart disease Mother  Heart disease Father    Breast cancer Maternal Aunt    Ovarian cancer Paternal Aunt    Breast cancer Maternal Aunt    Anesthesia problems Neg Hx    Hypotension Neg Hx    Malignant hyperthermia Neg Hx    Pseudochol deficiency Neg Hx     Social History Social History   Tobacco Use   Smoking status: Former    Packs/day: 2.00    Years: 40.00    Total pack years: 80.00    Types: Cigarettes    Quit date: 08/25/2006    Years since quitting: 15.9   Smokeless tobacco: Never  Vaping Use   Vaping Use: Never used  Substance Use Topics   Alcohol use: Yes    Comment: 1-2 times a year   Drug use: No     Allergies   Penicillins, Vicodin [hydrocodone-acetaminophen], Duloxetine hcl, Sulfa antibiotics, and Sulfamethoxazole   Review of Systems Review of Systems  Constitutional:  Positive for fatigue. Negative for chills and fever.  Eyes:  Negative for discharge and redness.  Respiratory:  Positive for cough and shortness of breath.   Cardiovascular:  Negative for chest pain.  Gastrointestinal:  Positive for diarrhea and nausea. Negative for abdominal pain and vomiting.  Genitourinary:  Positive for vaginal bleeding and vaginal discharge.     Physical Exam Triage Vital Signs ED Triage Vitals [07/31/22 1032]  Enc Vitals Group     BP 131/71     Pulse Rate 77     Resp (!) 24     Temp 97.8 F (36.6 C)     Temp Source Oral     SpO2 91 %     Weight      Height      Head Circumference      Peak Flow      Pain Score 9     Pain Loc      Pain Edu?      Excl. in [place]?    No data found.  Updated Vital Signs BP 131/71 (BP  Location: Right Arm)   Pulse 77   Temp 97.8 F (36.6 C) (Oral)   Resp (!) 24   SpO2 93%       Physical Exam Vitals and nursing note reviewed.  Constitutional:      General: She is in acute distress (appears to not feel well, short of breath, weak).     Appearance: She is ill-appearing.  HENT:     Head: Normocephalic and atraumatic.  Eyes:     Conjunctiva/sclera: Conjunctivae normal.  Cardiovascular:     Rate and Rhythm: Normal rate.     Heart sounds: Murmur heard.  Pulmonary:     Effort: Respiratory distress (appears short of breath-worse on exertion- improves with rest) present.     Comments: Deep breathing produces cough- difficulty properly assessing lung sounds due to same Neurological:     Mental Status: She is alert.  Psychiatric:        Thought Content: Thought content normal.     Comments: Appears somewhat anxious      UC Treatments / Results  Labs (all labs ordered are listed, but only abnormal results are displayed) Labs Reviewed - No data to display  EKG   Radiology No results found.  Procedures Procedures (including critical care time)  Medications Ordered in UC Medications - No data to display  Initial Impression / Assessment and Plan / UC Course  I have reviewed the triage vital signs and  the nursing notes.  Pertinent labs & imaging results that were available during my care of the patient were reviewed by me and considered in my medical decision making (see chart for details).    Given significant PMH, dyspnea on exertion (oxygen saturation drops to 80s with only minimal exertion) , symptoms similar to prior MI recommended further evaluation in the ED with safest mode of transport via EMS. Daughter and patient are agreeable with EMS transport. [facility] to transport.   Final Clinical Impressions(s) / UC Diagnoses   Final diagnoses:  Generalized weakness  Dyspnea on exertion  History of MI (myocardial infarction)  Chronic obstructive  pulmonary disease, unspecified COPD type [facility])   Discharge Instructions   None    ED Prescriptions   None    PDMP not reviewed this encounter.   Francene Finders, PA-C 07/31/22 1127

## 2022-07-31 NOTE — ED Triage Notes (Signed)
Pt c/o cough, fatigue, malaise, dyspnea, feeling numb inside. States called EMS this morning and their recommendation was be seen by UC. Pt requiring significant assistance moving to room.

## 2022-08-20 NOTE — ED Provider Notes (Signed)
[facility] EMER[facility]ENCY DEPARTMENT Provider Note   CSN: [id number] Arrival date & time: 07/31/22  1206     History  Chief Complaint  Patient presents with   Shortness of Breath    Bilateral arm pain and weakness     Unfortunately, patient eloped from the emergency department prior to my evaluation. I did not see the patient.      EKG Interpretation  Date/Time:  Wednesday July 31 2022 12:27:26 EST Ventricular Rate:  69 PR Interval:  170 QRS Duration: 76 QT Interval:  424 QTC Calculation: 454 R Axis:   7 Text Interpretation: Normal sinus rhythm Inferior infarct , age undetermined Abnormal ECG When compared with ECG of 31-Jul-2022 10:46, PREVIOUS ECG IS PRESENT Confirmed by Lennice Sites ([id number]) on 08/01/2022 9:06:08 AM        Clinical Course as of 08/20/22 2016  Wed Jul 31, 2022  2005 DG Chest 2 View FINDINGS: Trachea is midline. Heart size stable. Lungs are clear. No pleural fluid.  IMPRESSION: No acute findings.   [HN]    Clinical Course User Index [HN] Audley Hose, MD     Disposition:  Left prior to my evaluation    Co morbidities that complicate the patient evaluation  Past Medical History:  Diagnosis Date   Anxiety    Aortic stenosis    a. 12/2014 Echo:  EF 60-65%, no RWMA, Gr 1 DD, Ao sclerosis, mild AS, mean 14 mmHg, MAC; b. 10/2020 Echo: EF 60-65%, no rwma, GrII DD, nl RV fxn, mildly dil LA, Mild-mod AS (mean grad 16/peak grad 28, peak vel 2.53ms, AVA 1.04cm^2 by VTI - moderate).   Asthma    Breast cancer ([place] 08[place]   lt. breast ca   CAD (coronary artery disease)    a. 12/2011 NSTEMI/Cath: dLM 75%, pLAD 80%, RCA occluded, filled by collaterals from the LAD; b. 12/2011 CABG x4: LIMA-LAD, SVG-RCA, SVG-D1-->OM2; c. 08/2017 Cath: LM 70ost (FFR 0.82), LAD 50p (FFR 0.82), LCX nl, RCA 1042mTO, LIMA->LAD atretic, VG->D1 ok w/ 100 @ continuation to OM2, VG->dRCA ok-->Med rx.   Carotid artery disease (HCH[place]   a.  Pre-CABG  Dopplers 5/13: Bilateral 40-59%; b.  Carotid [place]/3/29RICA 4092-42%LICA 6068-34%normal subclavians bilaterally;  c. Carotid [place]119/62RICA 4022-97%LICA 4098-92%normal subclavians; d. Carotid [place]1/17:  Stable 40-59% bilateral ICA stenoses; e. 07/2017 Carotid U/S: 1-39% bilat ICA stenoses.   Chronic heart failure with preserved ejection fraction (HFpEF) ([place]CRe[place]   a. 10/2020 Echo: EF 60-65%, GrII DD.   COPD (chronic obstructive pulmonary disease) (HCC)    DM2 (diabetes mellitus, type 2) (HCC)    metformin   Gallstones    Symptomatic   HLD (hyperlipidemia)    Hx of cardiovascular stress test    Myoview 5/16:  small area of inf-lat ischemia, EF 70%; Low Risk >> try medical Rx   Hx of migraines    Hypertension    Myocardial infarction ([place]   Obesity    Shortness of breath    Sleep apnea    sleep study2-3 yrs ago lost weight afterward but gained back   UTI (lower urinary tract infection)    freq-bladder implant -removed     Medicines No orders of the defined types were placed in this encounter.   I have reviewed the patients home medicines and have made adjustments as needed  Problem List / ED Course: Problem List Items Addressed This Visit   None Visit Diagnoses  Eloped from emergency department    -  Primary                   This note was created using dictation software, which may contain spelling or grammatical errors.    Audley Hose, MD 08/20/22 [phone]

## 2022-09-16 ENCOUNTER — Other Ambulatory Visit: Payer: Self-pay | Admitting: Internal Medicine

## 2022-09-16 NOTE — Telephone Encounter (Signed)
Please contact patient for follow up appt, last seen by Dr End 03-06-22.  Follow up needed for refills.  Thank you.

## 2022-09-17 ENCOUNTER — Other Ambulatory Visit: Payer: Self-pay

## 2022-09-17 MED ORDER — METOPROLOL TARTRATE 25 MG PO TABS: 25.0000 mg | ORAL_TABLET | Freq: Two times a day (BID) | ORAL | 0 refills | Status: DC

## 2022-09-30 ENCOUNTER — Other Ambulatory Visit: Payer: Self-pay | Admitting: Internal Medicine

## 2022-10-14 ENCOUNTER — Other Ambulatory Visit: Payer: Self-pay | Admitting: Internal Medicine

## 2022-10-30 ENCOUNTER — Ambulatory Visit: Payer: Medicare Other | Admitting: Nurse Practitioner

## 2022-11-14 ENCOUNTER — Other Ambulatory Visit: Payer: Self-pay | Admitting: Nurse Practitioner

## 2022-11-14 DIAGNOSIS — I350 Nonrheumatic aortic (valve) stenosis: Secondary | ICD-10-CM

## 2022-11-14 DIAGNOSIS — I6523 Occlusion and stenosis of bilateral carotid arteries: Secondary | ICD-10-CM

## 2022-11-27 ENCOUNTER — Ambulatory Visit: Payer: Medicare Other | Attending: Cardiology

## 2022-11-27 ENCOUNTER — Encounter: Payer: Self-pay | Admitting: Internal Medicine

## 2022-11-27 ENCOUNTER — Ambulatory Visit (INDEPENDENT_AMBULATORY_CARE_PROVIDER_SITE_OTHER): Payer: Medicare Other

## 2022-11-27 DIAGNOSIS — I350 Nonrheumatic aortic (valve) stenosis: Secondary | ICD-10-CM | POA: Insufficient documentation

## 2022-11-27 DIAGNOSIS — I6523 Occlusion and stenosis of bilateral carotid arteries: Secondary | ICD-10-CM | POA: Insufficient documentation

## 2022-11-27 LAB — ECHOCARDIOGRAM COMPLETE
AR max vel: 1.1 cm2
AV Area VTI: 1.28 cm2
AV Area mean vel: 1.14 cm2
AV Mean grad: 24 mmHg
AV Peak grad: 45.7 mmHg
Ao pk vel: 3.38 m/s
Area-P 1/2: 2.39 cm2
S' Lateral: 3.8 cm

## 2022-11-27 MED ORDER — PERFLUTREN LIPID MICROSPHERE
1.0000 mL | INTRAVENOUS | Status: AC | PRN
  Administered 2022-11-27: 2 mL via INTRAVENOUS

## 2022-11-27 NOTE — Telephone Encounter (Signed)
Error

## 2022-12-02 ENCOUNTER — Telehealth: Payer: Self-pay | Admitting: Internal Medicine

## 2022-12-02 NOTE — Telephone Encounter (Signed)
Left a message for the patient to call back.  

## 2022-12-02 NOTE — Telephone Encounter (Signed)
Spoke with patient and informed her of results from echo as follows: Normal heart squeezing function with moderate stiffness of the left ventricle.  Moderate Aortic Stenosis, overall stable compared to last year.  Results from carotid duplex as follows: 1-39% bilateral carotid stenosis - stable.  Patient satisfied with results

## 2022-12-02 NOTE — Telephone Encounter (Signed)
Pt was returning nurse call from 11/29/2022 in regards to results and would like a callback. Please advise

## 2022-12-03 NOTE — Assessment & Plan Note (Signed)
Patient with progressing chronic exertional dyspnea and occasional swelling. NYHA II-III. As recent TTE from 11/27/22 showed overall stable heart function, will plan for cardiac PET to evaluate possibility of progressive CAD.   She continues with fluctuant lower extremity edema and has recently upped Lasix dosing to 40mg  daily (as she has done in the past in warmer months, reported this dose at PCP visit on 3/15. BMP at that time stable). Euvolemic on exam today. Patient is concerned about the number of medications she's taking given recent addition of Zetia for hyperlipidemia but if she continues to require higher dose of lasix for lower extremity edema, would plan to trial SGLT2 to hopefully decrease lasix dependence.

## 2022-12-03 NOTE — Assessment & Plan Note (Signed)
S/P CABGx4 2013. Last LHC in 2019 showed 70% ostial LMCA, 50% proximal LAD, and 100% mid RCA lesions, atretic LIMA->LAD, patent SVG->D1; jump portion to OM2 appeared chronically occluded, widely patent SVG->RCA.  Patient reporting slow progression of chronic exertional dyspnea. She denies focal anginal symptoms. Given stable echocardiogram and significant CAD hx, shared decision with patient to evaluate cardiac perfusion with cardiac PET. For now, will continue with Metoprolol Tartrate 25mg  BID and Imdur 60mg  as well as ASA and Lipitor. Patient has tentative plans for ptosis correction. Assuming stable cardiac perfusion, there would be no cardiac contraindication to having this completed.

## 2022-12-03 NOTE — Progress Notes (Unsigned)
Cardiology Office Note:    Date:  12/04/2022   ID:  Jamie [name], DOB May 31, 1946, MRN [id number]  PCP:  Raynelle Jan., MD  [facility] Providers Cardiologist:  Yvonne Kendall, MD    Referring MD: Raynelle Jan., MD   Chief Complaint:  Follow-up (6 month follow up. Following up from test. Patient states that she is having more trouble breathing. Has questions in regards to eyelid surgery.  Meds reviewed. )    Patient Profile:  CAD S/P CABG 2013. LIMA-LAD, SVG-RCA, SVG-D1-->OM2 Diastolic CHF LVEF 55-60% with grade II diastolic dysfunction per 11/27/22 TTE Mild-moderate aortic stenosis Aortic valve mean gradient measures 24.0 mmHg. Aortic valve peak gradient measures 45.7 mmHg. Aortic valve area, by   VTI measures 1.28 cm Carotic artery stenosis 1-39% bilateral carotid artery stenosis Hyperlipidemia Breast cancer s/p bilateral mastectomies Diabetes mellitus COPD OSA  Cardiac Studies & Procedures   CARDIAC CATHETERIZATION  CARDIAC CATHETERIZATION 08/22/2017  Narrative Conclusions: 1. Severe native coronary artery disease, including 70% ostial LMCA, 50% proximal LAD, and 100% mid RCA lesions. 2. Atretic LIMA->LAD. 3. Patent SVG->D1; jump portion to OM2 appears chronically occluded. 4. Widely patent SVG->RCA. 5. FFR of ostial LMCA and proximal LAD lesions is not significant (FFR 0.82), though this may be influenced by some retrograde from via the SVG->D. 6. Mildly elevated left heart, right heart, and pulmonary artery pressures. 7. Normal Fick cardiac output/index. 8. Mild aortic stenosis.  Recommendations: 1. Aggressive medical therapy. I will start isosorbide mononitrate 30 mg daily, which should be escalated as tolerated. If the patient is intolerant of isosorbide mononitrate of has incomplete relief of symptoms, addition of ranolazine and pulmonary consultation should be considered. 2. If marked dyspnea persists despite maximal antianginal therapy,  adequate diuresis, weight loss, and pulmonary evaluation, PCI to the LMCA could be considered. 3. Continue secondary prevention, including high-intensity statin therapy and diabetes control.  Yvonne Kendall, MD [facility] Pager: [phone]  Findings Coronary Findings Diagnostic  Dominance: Right  Left Main Vessel was injected. Vessel is large. Ost LM lesion is 70% stenosed. Pressure wire/FFR was performed on the lesion. FFR: 0.82.  Left Anterior Descending Vessel was injected. Vessel is large. Prox LAD lesion is 50% stenosed. Pressure wire/FFR was performed on the lesion. FFR: 0.82.  First Diagonal Branch Vessel is moderate in size.  Second Diagonal Branch Vessel is small in size.  Left Circumflex Vessel was injected. Vessel is moderate in size. Vessel is angiographically normal.  First Obtuse Marginal Branch Vessel is small in size.  Second Obtuse Marginal Branch Vessel is moderate in size.  Right Coronary Artery Vessel was injected. Vessel is moderate in size. Mid RCA lesion is 100% stenosed. The lesion is chronically occluded.  Right Posterior Descending Artery Vessel is moderate in size. Vessel is angiographically normal.  Right Posterior Atrioventricular Artery Vessel is large in size. Vessel is angiographically normal.  Saphenous Graft To Dist RCA SVG graft was visualized by angiography and is large. The graft exhibits no disease.  Sequential Saphenous Graft To 1st Diag, 2nd Mrg SVG graft was visualized by angiography and is large. Origin to Insertion lesion between 1st Diag and 2nd Mrg  is 100% stenosed. The lesion is chronically occluded.  LIMA LIMA Graft To Mid LAD LIMA graft was visualized by angiography. The graft is atretic.  There is competitive flow.  Intervention  No interventions have been documented.   STRESS TESTS  MYOCARDIAL PERFUSION IMAGING 12/14/2014  Narrative  Small in size, mild in severity  perfusion defect in the basal  inferolateral wall is mostly reversible and may represent ischemia in the left circumflex artery territory.  Low risk pharmacological stress nuclear study with a small area of mild basal inferolateral ischemia and normal left ventricular regional and global systolic function.   ECHOCARDIOGRAM  ECHOCARDIOGRAM COMPLETE 11/27/2022  Narrative ECHOCARDIOGRAM REPORT    Patient Name:   Jamie [name] Date of Exam: 11/27/2022 Medical Rec #:  [id number]     Height:       65.0 in Accession #:    [id number]    Weight:       255.0 lb Date of Birth:  1946-02-02     BSA:          2.193 m Patient Age:    77 years      BP:           154/72 mmHg Patient Gender: F             HR:           62 bpm. Exam Location:  [place]  Procedure: 2D Echo and Intracardiac Opacification Agent  Indications:    I35.0 Nonrheumatic aortic (valve) stenosis  History:        Patient has prior history of Echocardiogram examinations, most recent 10/23/2021. CHF, CAD and Previous Myocardial Infarction, Prior CABG, COPD, Aortic Valve Disease, Signs/Symptoms:Dyspnea and Fatigue; Risk Factors:Sleep Apnea, Former Smoker, Hypertension and Diabetes.  Sonographer:    Quentin Ore RDMS, RVT, RDCS Referring Phys: [id number] CHRISTOPHER RONALD BERGE  IMPRESSIONS   1. Left ventricular ejection fraction, by estimation, is 55 to 60%. The left ventricle has normal function. The left ventricle has no regional wall motion abnormalities. Left ventricular diastolic parameters are consistent with Grade II diastolic dysfunction (pseudonormalization). 2. Right ventricular systolic function is normal. The right ventricular size is normal. 3. Left atrial size was mildly dilated. 4. The mitral valve is normal in structure. No evidence of mitral valve regurgitation. 5. The aortic valve was not well visualized. Aortic valve regurgitation is not visualized. Moderate aortic valve stenosis. Aortic valve area, by VTI measures 1.28 cm. Aortic valve mean  gradient measures 24.0 mmHg. Aortic valve Vmax measures 3.38 m/s. 6. The inferior vena cava is normal in size with greater than 50% respiratory variability, suggesting right atrial pressure of 3 mmHg.  Comparison(s): Previous AV meas reported as 46max/20mean PG.  FINDINGS Left Ventricle: Left ventricular ejection fraction, by estimation, is 55 to 60%. The left ventricle has normal function. The left ventricle has no regional wall motion abnormalities. Definity contrast agent was given IV to delineate the left ventricular endocardial borders. The left ventricular internal cavity size was normal in size. There is no left ventricular hypertrophy. Left ventricular diastolic parameters are consistent with Grade II diastolic dysfunction (pseudonormalization).  Right Ventricle: The right ventricular size is normal. No increase in right ventricular wall thickness. Right ventricular systolic function is normal.  Left Atrium: Left atrial size was mildly dilated.  Right Atrium: Right atrial size was not well visualized.  Pericardium: There is no evidence of pericardial effusion.  Mitral Valve: The mitral valve is normal in structure. No evidence of mitral valve regurgitation.  Tricuspid Valve: The tricuspid valve is normal in structure. Tricuspid valve regurgitation is not demonstrated.  Aortic Valve: The aortic valve was not well visualized. Aortic valve regurgitation is not visualized. Moderate aortic stenosis is present. Aortic valve mean gradient measures 24.0 mmHg. Aortic valve peak gradient measures 45.7 mmHg. Aortic valve area, by VTI  measures 1.28 cm.  Pulmonic Valve: The pulmonic valve was not well visualized. Pulmonic valve regurgitation is not visualized.  Aorta: The aortic root is normal in size and structure.  Venous: The inferior vena cava is normal in size with greater than 50% respiratory variability, suggesting right atrial pressure of 3 mmHg.  IAS/Shunts: No atrial level shunt  detected by color flow Doppler.   LEFT VENTRICLE PLAX 2D LVIDd:         5.10 cm   Diastology LVIDs:         3.80 cm   LV e' medial:    5.22 cm/s LV PW:         0.90 cm   LV E/e' medial:  36.0 LV IVS:        0.90 cm   LV e' lateral:   6.64 cm/s LVOT diam:     1.80 cm   LV E/e' lateral: 28.3 LV SV:         99 LV SV Index:   45 LVOT Area:     2.54 cm   RIGHT VENTRICLE            IVC RV S prime:     9.14 cm/s  IVC diam: 1.60 cm TAPSE (M-mode): 3.1 cm  LEFT ATRIUM           Index LA diam:      4.40 cm 2.01 cm/m LA Vol (A4C): 83.9 ml 38.25 ml/m AORTIC VALVE                     PULMONIC VALVE AV Area (Vmax):    1.10 cm      PV Vmax:       1.52 m/s AV Area (Vmean):   1.14 cm      PV Peak grad:  9.3 mmHg AV Area (VTI):     1.28 cm AV Vmax:           338.00 cm/s AV Vmean:          234.000 cm/s AV VTI:            0.772 m AV Peak Grad:      45.7 mmHg AV Mean Grad:      24.0 mmHg LVOT Vmax:         146.00 cm/s LVOT Vmean:        105.000 cm/s LVOT VTI:          0.389 m LVOT/AV VTI ratio: 0.50  AORTA Ao Root diam: 3.20 cm Ao Asc diam:  2.50 cm  MITRAL VALVE MV Area (PHT): 2.39 cm     SHUNTS MV Decel Time: 317 msec     Systemic VTI:  0.39 m MV E velocity: 188.00 cm/s  Systemic Diam: 1.80 cm MV A velocity: 103.00 cm/s MV E/A ratio:  1.83  Debbe Odea MD Electronically signed by Debbe Odea MD Signature Date/Time: 11/27/2022/2:51:07 PM    Final              History of Present Illness:   STARLETT PEHRSON is a 77 y.o. female with the above problem list.  She presents today for 6 month follow-up. Patient last seen by Dr. Okey Dupre in August of 2023 where she reported stable exertional dyspnea and mild lower extremity edema. Today patient states that she has noticed a gradual increase in exertional dyspnea since last office visit. She says that she remains able to do all daily tasks but is noticing a need to take  more frequent breaks. Overall energy level is also down.  Patient admits some weight gain and wonders if this is contributing to symptoms. She denies orthopnea, PND, chest tightness/pain, palpitations, lightheadedness/dizziness. Patient reports intermittent occurrence of lower extremity edema, typically worse in warmer months or when she spends a long period of time sitting down. She is currently taking Lasix 40mg  (reported this dose to PCP at 3/15 visit) says that this was also her daily dose last year in warmer months. When the weather cools back down, she cuts back to PRN Lasix.      Past Medical History:  Diagnosis Date   Anxiety    Aortic stenosis    a. 12/2014 Echo:  EF 60-65%, no RWMA, Gr 1 DD, Ao sclerosis, mild AS, mean 14 mmHg, MAC; b. 10/2020 Echo: EF 60-65%, no rwma, GrII DD, nl RV fxn, mildly dil LA, Mild-mod AS (mean grad 16/peak grad 28, peak vel 2.2m/s, AVA 1.04cm^2 by VTI - moderate).   Asthma    Breast cancer (HCC) 08/2011   lt. breast ca   CAD (coronary artery disease)    a. 12/2011 NSTEMI/Cath: dLM 75%, pLAD 80%, RCA occluded, filled by collaterals from the LAD; b. 12/2011 CABG x4: LIMA-LAD, SVG-RCA, SVG-D1-->OM2; c. 08/2017 Cath: LM 70ost (FFR 0.82), LAD 50p (FFR 0.82), LCX nl, RCA 121m CTO, LIMA->LAD atretic, VG->D1 ok w/ 100 @ continuation to OM2, VG->dRCA ok-->Med rx.   Carotid artery disease (HCC)    a.  Pre-CABG Dopplers 5/13: Bilateral 40-59%; b.  Carotid US 6/16: RICA 40-59%, LICA 60-79%, normal subclavians bilaterally;  c. Carotid US 11/16: RICA 40-59%; LICA 40-59%, normal subclavians; d. Carotid US 11/17:  Stable 40-59% bilateral ICA stenoses; e. 07/2017 Carotid U/S: 1-39% bilat ICA stenoses.   Chronic heart failure with preserved ejection fraction (HFpEF) (HCC)    a. 10/2020 Echo: EF 60-65%, GrII DD.   COPD (chronic obstructive pulmonary disease) (HCC)    DM2 (diabetes mellitus, type 2) (HCC)    metformin   Gallstones    Symptomatic   HLD (hyperlipidemia)    Hx of cardiovascular stress test    Myoview 5/16:  small area of  inf-lat ischemia, EF 70%; Low Risk >> try medical Rx   Hx of migraines    Hypertension    Myocardial infarction (HCC)    Obesity    Shortness of breath    Sleep apnea    sleep study2-3 yrs ago lost weight afterward but gained back   UTI (lower urinary tract infection)    freq-bladder implant -removed   Current Medications: Current Meds  Medication Sig   albuterol (PROVENTIL) (2.5 MG/3ML) 0.083% nebulizer solution Inhale 3 mLs into the lungs every 6 (six) hours as needed for wheezing or shortness of breath.   aspirin EC 81 MG tablet Take 1 tablet (81 mg total) by mouth daily.   atorvastatin (LIPITOR) 80 MG tablet TAKE 1 TABLET BY MOUTH ONCE DAILY AT  6PM   cephALEXin (KEFLEX) 500 MG capsule Take 500 mg by mouth daily.    Cholecalciferol (VITAMIN D3) 1000 units CAPS Take 1,000 Units by mouth daily.    fluticasone (FLONASE) 50 MCG/ACT nasal spray Place 2 sprays into both nostrils daily.    furosemide (LASIX) 20 MG tablet TAKE 1 TABLET BY MOUTH ONCE DAILY AS NEEDED FOR  EDEMA  (SWELLING) (Patient taking differently: Take 20 mg by mouth 2 (two) times daily.)   isosorbide mononitrate (IMDUR) 60 MG 24 hr tablet Take 1 tablet by mouth twice daily  lisinopril (ZESTRIL) 5 MG tablet Take 1 tablet by mouth once daily   loratadine (CLARITIN) 10 MG tablet Take 10 mg by mouth daily.   metFORMIN (GLUCOPHAGE) 500 MG tablet Take 500 mg by mouth daily with breakfast.   metoprolol tartrate (LOPRESSOR) 25 MG tablet Take 1 tablet (25 mg total) by mouth 2 (two) times daily.   Multiple Vitamin (MULTI VITAMIN DAILY PO) Take 1 tablet by mouth daily.   nitroGLYCERIN (NITROSTAT) 0.4 MG SL tablet Place 1 tablet (0.4 mg total) under the tongue every 5 (five) minutes as needed.   Polyethyl Glycol-Propyl Glycol 0.4-0.3 % SOLN Place 1 drop into both eyes 4 (four) times daily as needed (for dry eyes).     Allergies:   Penicillins, Vicodin [hydrocodone-acetaminophen], Duloxetine hcl, Sulfa antibiotics, and  Sulfamethoxazole   Social History   Occupational History   Occupation: retired  Tobacco Use   Smoking status: Former    Packs/day: 2.00    Years: 40.00    Additional pack years: 0.00    Total pack years: 80.00    Types: Cigarettes    Quit date: 08/25/2006    Years since quitting: 16.2   Smokeless tobacco: Never  Vaping Use   Vaping Use: Never used  Substance and Sexual Activity   Alcohol use: Yes    Comment: 1-2 times a year   Drug use: No   Sexual activity: Not Currently    Family Hx: The patient's family history includes Breast cancer in her maternal aunt and maternal aunt; Heart disease in her father and mother; Ovarian cancer in her paternal aunt. There is no history of Anesthesia problems, Hypotension, Malignant hyperthermia, or Pseudochol deficiency.  Review of Systems  Constitutional: Negative for weight gain and weight loss.  Cardiovascular:  Positive for dyspnea on exertion (slowly progressing symptoms) and leg swelling (intermittent). Negative for chest pain, irregular heartbeat, orthopnea, palpitations and syncope.  Respiratory:  Negative for cough, shortness of breath and snoring.   All other systems reviewed and are negative.    EKGs/Labs/Other Test Reviewed:    EKG:  EKG is ordered today.  The ekg ordered today demonstrates sinus rhythm with no acute ischemic changes.   Recent Labs: No results found for requested labs within last 365 days.   Recent Lipid Panel No results for input(s): "CHOL", "TRIG", "HDL", "VLDL", "LDLCALC", "LDLDIRECT" in the last 8760 hours.    Risk Assessment/Calculations/Metrics:              Physical Exam:    VS:  BP 116/66 (BP Location: Right Arm, Patient Position: Sitting)   Pulse 60   Ht 5\' 5"  (1.651 m)   Wt 250 lb 6.4 oz (113.6 kg)   SpO2 97%   BMI 41.67 kg/m     Wt Readings from Last 3 Encounters:  12/04/22 250 lb 6.4 oz (113.6 kg)  03/06/22 255 lb (115.7 kg)  09/12/21 250 lb 2 oz (113.5 kg)    Vitals reviewed.   Constitutional:      Appearance: Healthy appearance.  Eyes:     Pupils: Pupils are equal, round, and reactive to light.  Neck:     Vascular: JVD normal.  Pulmonary:     Effort: Pulmonary effort is normal.  Cardiovascular:     PMI at left midclavicular line. Normal rate. Regular rhythm. Normal S1. Normal S2.      Murmurs: There is a harsh midsystolic murmur at the URSB, radiating to the neck.     No gallop.  No click.  No rub.  Pulses:    Intact distal pulses.  Edema:    Peripheral edema absent.  Abdominal:     General: Bowel sounds are normal.     Palpations: Abdomen is soft.  Musculoskeletal: Normal range of motion. Skin:    General: Skin is warm and dry.  Neurological:     Mental Status: Alert and oriented to person, place and time.         ASSESSMENT & PLAN:   Coronary artery disease of native artery of native heart with stable angina pectoris (HCC) S/P CABGx4 2013. Last LHC in 2019 showed 70% ostial LMCA, 50% proximal LAD, and 100% mid RCA lesions, atretic LIMA->LAD, patent SVG->D1; jump portion to OM2 appeared chronically occluded, widely patent SVG->RCA.  Patient reporting slow progression of chronic exertional dyspnea. She denies focal anginal symptoms. Given stable echocardiogram and significant CAD hx, shared decision with patient to evaluate cardiac perfusion with cardiac PET. For now, will continue with Metoprolol Tartrate 25mg  BID and Imdur 60mg  as well as ASA and Lipitor. Patient has tentative plans for ptosis correction. Assuming stable cardiac perfusion, there would be no cardiac contraindication to having this completed.   Carotid stenosis Stenosis stable per 11/27/22 carotid ultrasound. Continue ASA and Lipitor.  Chronic heart failure with preserved ejection fraction (HFpEF) (HCC) Patient with progressing chronic exertional dyspnea and occasional swelling. NYHA II-III. As recent TTE from 11/27/22 showed overall stable heart function, will plan for cardiac PET to  evaluate possibility of progressive CAD.   She continues with fluctuant lower extremity edema and has recently upped Lasix dosing to 40mg  daily (as she has done in the past in warmer months, reported this dose at PCP visit on 3/15. BMP at that time stable). Euvolemic on exam today. Patient is concerned about the number of medications she's taking given recent addition of Zetia for hyperlipidemia but if she continues to require higher dose of lasix for lower extremity edema, would plan to trial SGLT2 to hopefully decrease lasix dependence.  Moderate aortic stenosis 11/27/22 TTE shows aortic valve mean gradient measures 24.0 mmHg. Aortic valve peak gradient measures 45.7 mmHg. Aortic valve area, by VTI measures 1.28 cm. Overall stable study, low suspicion of AS etiology of progressing exertional dyspnea. Will plan to repeat TTE in 1 year.  Essential hypertension BP at goal today in clinic. Continue Imdur 60mg , Metoprolol Tartrate 25mg  BID.  Hyperlipidemia LDL goal <70 Patient with LDL of 71 per PCP labs. Zetia 10mg  added by PCP. Continue both Zetia 10mg  and Atorvastatin 80mg .        Shared Decision Making/Informed Consent The risks [chest pain, shortness of breath, cardiac arrhythmias, dizziness, blood pressure fluctuations, myocardial infarction, stroke/transient ischemic attack, nausea, vomiting, allergic reaction, radiation exposure, metallic taste sensation and life-threatening complications (estimated to be 1 in 10,000)], benefits (risk stratification, diagnosing coronary artery disease, treatment guidance) and alternatives of a cardiac PET stress test were discussed in detail with Ms. Sunday and she agrees to proceed.   Dispo:  No follow-ups on file.   Medication Adjustments/Labs and Tests Ordered: Current medicines are reviewed at length with the patient today.  Concerns regarding medicines are outlined above.  Tests Ordered: Orders Placed This Encounter  Procedures   NM PET CT CARDIAC  PERFUSION MULTI W/ABSOLUTE BLOODFLOW   EKG 12-Lead   Medication Changes: No orders of the defined types were placed in this encounter.  Con Memos, PA-C  12/04/2022 12:42 PM    [facility] [address] [place], [place],  [place]  [address] Phone: [phone]; Fax: [phone]

## 2022-12-03 NOTE — Assessment & Plan Note (Addendum)
Stenosis stable per 11/27/22 carotid ultrasound. Continue ASA, Lipitor, Zetia.

## 2022-12-04 ENCOUNTER — Encounter: Payer: Self-pay | Admitting: Nurse Practitioner

## 2022-12-04 ENCOUNTER — Ambulatory Visit: Payer: Medicare Other | Attending: Nurse Practitioner | Admitting: Cardiology

## 2022-12-04 VITALS — BP 116/66 | HR 60 | Ht 65.0 in | Wt 250.4 lb

## 2022-12-04 DIAGNOSIS — R0609 Other forms of dyspnea: Secondary | ICD-10-CM | POA: Diagnosis present

## 2022-12-04 DIAGNOSIS — I10 Essential (primary) hypertension: Secondary | ICD-10-CM | POA: Diagnosis present

## 2022-12-04 DIAGNOSIS — I5032 Chronic diastolic (congestive) heart failure: Secondary | ICD-10-CM | POA: Insufficient documentation

## 2022-12-04 DIAGNOSIS — I25118 Atherosclerotic heart disease of native coronary artery with other forms of angina pectoris: Secondary | ICD-10-CM | POA: Insufficient documentation

## 2022-12-04 DIAGNOSIS — I6523 Occlusion and stenosis of bilateral carotid arteries: Secondary | ICD-10-CM

## 2022-12-04 DIAGNOSIS — I350 Nonrheumatic aortic (valve) stenosis: Secondary | ICD-10-CM | POA: Diagnosis present

## 2022-12-04 DIAGNOSIS — E785 Hyperlipidemia, unspecified: Secondary | ICD-10-CM | POA: Diagnosis present

## 2022-12-04 MED ORDER — FUROSEMIDE 20 MG PO TABS: 20.0000 mg | ORAL_TABLET | Freq: Two times a day (BID) | ORAL | Status: DC

## 2022-12-04 NOTE — Patient Instructions (Signed)
Medication Instructions:  No changes *If you need a refill on your cardiac medications before your next appointment, please call your pharmacy*   Lab Work: None ordered If you have labs (blood work) drawn today and your tests are completely normal, you will receive your results only by: MyChart Message (if you have MyChart) OR A paper copy in the mail If you have any lab test that is abnormal or we need to change your treatment, we will call you to review the results.   Follow-Up: At [facility], you and your health needs are our priority.  As part of our continuing mission to provide you with exceptional heart care, we have created designated Provider Care Teams.  These Care Teams include your primary Cardiologist (physician) and Advanced Practice Providers (APPs -  Physician Assistants and Nurse Practitioners) who all work together to provide you with the care you need, when you need it.  We recommend signing up for the patient portal called "MyChart".  Sign up information is provided on this After Visit Summary.  MyChart is used to connect with patients for Virtual Visits (Telemedicine).  Patients are able to view lab/test results, encounter notes, upcoming appointments, etc.  Non-urgent messages can be sent to your provider as well.   To learn more about what you can do with MyChart, go to [email or web address].    Your next appointment:   3 month(s)  Provider:   You may see Yvonne Kendall, MD  or one of the following Advanced Practice Providers on your designated Care Team:   Nicolasa Ducking, NP Eula Listen, PA-C Cadence Fransico Michael, PA-C Charlsie Quest, NP    Other Instructions How to Prepare for Your Cardiac PET/CT Stress Test:  1. Please do not take these medications before your test:   Medications that may interfere with the cardiac pharmacological stress agent (ex. nitrates - including erectile dysfunction medications, isosorbide mononitrate, tamulosin or  beta-blockers) the day of the exam. (Erectile dysfunction medication should be held for at least 72 hrs prior to test) Theophylline containing medications for 12 hours. Dipyridamole 48 hours prior to the test. Your remaining medications may be taken with water.  2. Nothing to eat or drink, except water, 3 hours prior to arrival time.   NO caffeine/decaffeinated products, or chocolate 12 hours prior to arrival.  3. NO perfume, cologne or lotion  4. Total time is 1 to 2 hours; you may want to bring reading material for the waiting time.  5. Please report to Radiology at the [facility] Main Entrance 30 minutes early for your test.  [address] [place], [place] [address]  Diabetic Preparation:  Hold oral medications. You may take NPH and Lantus insulin. Do not take Humalog or Humulin R (Regular Insulin) the day of your test. Check blood sugars prior to leaving the house. If able to eat breakfast prior to 3 hour fasting, you may take all medications, including your insulin, Do not worry if you miss your breakfast dose of insulin - start at your next meal.  IF YOU THINK YOU MAY BE PREGNANT, OR ARE NURSING PLEASE INFORM THE TECHNOLOGIST.  In preparation for your appointment, medication and supplies will be purchased.  Appointment availability is limited, so if you need to cancel or reschedule, please call the Radiology Department at [phone]  24 hours in advance to avoid a cancellation fee of $100.00  What to Expect After you Arrive:  Once you arrive and check in for your appointment, you  will be taken to a preparation room within the Radiology Department.  A technologist or Nurse will obtain your medical history, verify that you are correctly prepped for the exam, and explain the procedure.  Afterwards,  an IV will be started in your arm and electrodes will be placed on your skin for EKG monitoring during the stress portion of the exam. Then you will be escorted to the  PET/CT scanner.  There, staff will get you positioned on the scanner and obtain a blood pressure and EKG.  During the exam, you will continue to be connected to the EKG and blood pressure machines.  A small, safe amount of a radioactive tracer will be injected in your IV to obtain a series of pictures of your heart along with an injection of a stress agent.    After your Exam:  It is recommended that you eat a meal and drink a caffeinated beverage to counter act any effects of the stress agent.  Drink plenty of fluids for the remainder of the day and urinate frequently for the first couple of hours after the exam.  Your doctor will inform you of your test results within 7-10 business days.  For questions about your test or how to prepare for your test, please call: Rockwell Alexandria, Cardiac Imaging Nurse Navigator  Larey Brick, Cardiac Imaging Nurse Navigator Office: [phone]

## 2022-12-04 NOTE — Assessment & Plan Note (Addendum)
11/27/22 TTE shows aortic valve mean gradient measures 24.0 mmHg. Aortic valve peak gradient measures 45.7 mmHg. Aortic valve area, by VTI measures 1.28 cm. Overall stable study, low suspicion of AS etiology of progressing exertional dyspnea. Will plan to repeat TTE in 1 year.

## 2022-12-04 NOTE — Assessment & Plan Note (Signed)
Patient with LDL of 71 per PCP labs. Zetia 10mg  added by PCP. Continue both Zetia 10mg  and Atorvastatin 80mg .

## 2022-12-04 NOTE — Assessment & Plan Note (Signed)
BP at goal today in clinic. Continue Imdur 60mg , Metoprolol Tartrate 25mg  BID.

## 2022-12-12 ENCOUNTER — Other Ambulatory Visit: Payer: Self-pay | Admitting: Internal Medicine

## 2022-12-25 ENCOUNTER — Telehealth: Payer: Self-pay | Admitting: Internal Medicine

## 2022-12-25 NOTE — Telephone Encounter (Signed)
Pt c/o swelling: STAT is pt has developed SOB within 24 hours  How much weight have you gained and in what time span?  Patient assumes she has gained 1-2 lbs but she does not keep track of her weights  If swelling, where is the swelling located?  Feet/legs   Are you currently taking a fluid pill?  Yes   Are you currently SOB?  No   Do you have a log of your daily weights (if so, list)?  No   Have you gained 3 pounds in a day or 5 pounds in a week?   Have you traveled recently?  No

## 2022-12-25 NOTE — Telephone Encounter (Signed)
Initial Assessment Questions   1. ONSET: "Probably a week ago"  2. LOCATION: "Both legs from the knee down, my left leg is a little worse than the right"  3. SEVERITY: "How bad is the swelling?"    - MODERATE edema: Swelling of lower leg to knee, pitting edema > 1/4 inch (6 mm) deep, rest and elevation only partially reduce swelling.  4. REDNESS: "No it looks normal"  5. PAIN: "No pain just discomfort"  6. FEVER: "No fever"  7. CAUSE: "I think it's my heart again"  8. MEDICAL HISTORY: "CHF, COPD and I had cancer"  9. RECURRENT SYMPTOM: "About 10-11 years ago"  10. OTHER SYMPTOMS: "I have some shortness of breath but no chest pain"  11. PREGNANCY: N/A  Patient is scheduled to see provider 01/02/23 @ 2:45 PM

## 2023-01-02 ENCOUNTER — Ambulatory Visit: Payer: Medicare Other | Attending: Cardiology | Admitting: Cardiology

## 2023-01-02 ENCOUNTER — Telehealth: Payer: Self-pay | Admitting: *Deleted

## 2023-01-02 ENCOUNTER — Encounter: Payer: Self-pay | Admitting: Cardiology

## 2023-01-02 VITALS — BP 135/72 | HR 73 | Ht 65.0 in | Wt 252.0 lb

## 2023-01-02 DIAGNOSIS — I5032 Chronic diastolic (congestive) heart failure: Secondary | ICD-10-CM

## 2023-01-02 DIAGNOSIS — I6523 Occlusion and stenosis of bilateral carotid arteries: Secondary | ICD-10-CM | POA: Diagnosis present

## 2023-01-02 DIAGNOSIS — I350 Nonrheumatic aortic (valve) stenosis: Secondary | ICD-10-CM | POA: Diagnosis present

## 2023-01-02 DIAGNOSIS — I25118 Atherosclerotic heart disease of native coronary artery with other forms of angina pectoris: Secondary | ICD-10-CM | POA: Diagnosis present

## 2023-01-02 DIAGNOSIS — I10 Essential (primary) hypertension: Secondary | ICD-10-CM

## 2023-01-02 DIAGNOSIS — R0609 Other forms of dyspnea: Secondary | ICD-10-CM | POA: Insufficient documentation

## 2023-01-02 DIAGNOSIS — E785 Hyperlipidemia, unspecified: Secondary | ICD-10-CM | POA: Diagnosis present

## 2023-01-02 DIAGNOSIS — I2510 Atherosclerotic heart disease of native coronary artery without angina pectoris: Secondary | ICD-10-CM | POA: Insufficient documentation

## 2023-01-02 MED ORDER — FUROSEMIDE 20 MG PO TABS: ORAL_TABLET | ORAL | 1 refills | Status: DC

## 2023-01-02 MED ORDER — SODIUM CHLORIDE 0.9% FLUSH
3.0000 mL | Freq: Two times a day (BID) | INTRAVENOUS | Status: DC
Start: 2023-01-02 — End: 2023-01-02

## 2023-01-02 NOTE — Patient Instructions (Addendum)
Medication Instructions:  Your physician has recommended you make the following change in your medication:   Increase- Take Furosemide 40 mg am and 20 mg tablet by mouth pm daily.  *If you need a refill on your cardiac medications before your next appointment, please call your pharmacy*   Lab Work:  Your physician recommends that you have lab work Tomorrow 01/03/2023 ( per patient).  - Please go to the [facility]. You will check in at the front desk to the right as you walk into the atrium. Valet Parking is offered if needed. - No appointment needed. You may go any day between 7 am and 6 pm.  BMP/CBC  If you have labs (blood work) drawn today and your tests are completely normal, you will receive your results only by: MyChart Message (if you have MyChart) OR A paper copy in the mail If you have any lab test that is abnormal or we need to change your treatment, we will call you to review the results.   Testing/Procedures:   [place] A DEPT OF [place]. [facility] AT [place] [address] [place], [address] [place] [address] Dept: [phone] Loc: [phone]  DAYRA WROBLEWSKI  01/02/2023  You are scheduled for a RIGHT/LEFT Cardiac Catheterization on Monday, June 3 with Dr. Cristal [place].  1. Please arrive at the [facility] (Main Entrance A) at [facility]: [address] [place], [place] [address] at 7:00 AM (This time is 2 hour(s) before your procedure to ensure your preparation). Free valet parking service is available. You will check in at ADMITTING. The support person will be asked to wait in the waiting room.  It is OK to have someone drop you off and come back when you are ready to be discharged.    Special note: Every effort is made to have your procedure done on time. Please understand that emergencies sometimes delay scheduled procedures.  2. Diet: Do not eat solid foods after midnight.  The  patient may have clear liquids until 5am upon the day of the procedure.  3. Labs: You will need to have blood drawn on Friday, May 31 at [facility] Entrance, Go to 1st desk on your right to register.  Address: [address] [place], [place] [address]  Open: 8am - 5pm  Phone: [phone]. You do not need to be fasting.  4. Medication instructions in preparation for your procedure:   Contrast Allergy: No   *Stop taking, Lisinopril (Zestril or Prinivil) Monday, January 06, 2023 Lasix (Furosemide)  Monday, January 06, 2023 hold the day of Procedure.   Do not take Diabetes Med Glucophage (Metformin) on the day of the procedure and HOLD 48 HOURS AFTER THE PROCEDURE.  On the morning of your procedure, take your Aspirin 81 mg and any morning medicines NOT listed above.  You may use sips of water.  5. Plan to go home the same day, you will only stay overnight if medically necessary. 6. Bring a current list of your medications and current insurance cards. 7. You MUST have a responsible person to drive you home. 8. Someone MUST be with you the first 24 hours after you arrive home or your discharge will be delayed. 9. Please wear clothes that are easy to get on and off and wear slip-on shoes.  Thank you for allowing [place] to care for you!   -- [place] [facility]   Follow-Up: At [facility], you and your health needs are our priority.  As part of our continuing mission to provide you with exceptional heart care, we have created designated Provider Care Teams.  These Care Teams include your primary Cardiologist (physician) and Advanced Practice Providers (APPs -  Physician Assistants and Nurse Practitioners) who all work together to provide you with the care you need, when you need it.  We recommend signing up for the patient portal called "MyChart".  Sign up information is provided on this After Visit Summary.  MyChart is used to connect with patients for Virtual  Visits (Telemedicine).  Patients are able to view lab/test results, encounter notes, upcoming appointments, etc.  Non-urgent messages can be sent to your provider as well.   To learn more about what you can do with MyChart, go to [email or web address].    Your next appointment:   1-2 week(s)  Provider:   You may see Yvonne Kendall, MD or one of the following Advanced Practice Providers on your designated Care Team:   Nicolasa Ducking, NP Eula Listen, PA-C Cadence Fransico Michael, PA-C Charlsie Quest, NP    *PET Stress test no longer needed* order has been cancelled per [facility]

## 2023-01-02 NOTE — Progress Notes (Signed)
Cardiology Office Note:   Date:  01/02/2023  ID:  SADIA FARONE, DOB 11-03-1945, MRN [id number] PCP: Raynelle Jan., MD  [facility] Providers Cardiologist:  Yvonne Kendall, MD    History of Present Illness:   Jamie [name] is a 77 y.o. female with a past medical history of coronary artery disease status post CABG (2013), diastolic heart failure, mild to moderate aortic stenosis, hyperlipidemia, carotid artery stenosis, breast cancer status post bilateral mastectomies, diabetes, COPD, sleep apnea, and obesity, who presents for follow-up of coronary artery disease and HFpEF.  She previously suffered from an NSTEMI in May 2013 with diagnostic catheterization revealed severe multivessel disease and underwent CABG x 4.  Stress testing was performed in May 2016 with finding of small area of inferior lateral ischemia.  She was medically managed.  She underwent repeat catheterization in January 2019 in setting of dyspnea on exertion and this revealed an atretic LIMA to the LAD, patent vein graft to the diagonal with an occluded jump portion milligram to the OM 2, and a pink-tan To the RCA.  70% ostial left main 80% proximal LAD stenosis.  FFR was measured to the stenosis were normal at 0.82.  She was placed on isosorbide.  Echocardiogram in March 2022 revealed an EF of 60 to 65%, G2 DD, aortic valve area of 1.04 cm by VTI, suggestive of moderate AS.  She was last seen in clinic 12/04/2022 by E. [place], [place].  At her appointment she noticed increasing exertional dyspnea, weight, and peripheral edema.  She was scheduled for cardiac PET stress testing at her appointment.  She returns to clinic today with worsening dyspnea on exertion, worsening peripheral edema but she is having leg pain from swelling, stated that she had an episode where both of her arms had gone with her worsening shortness of breath she had fatigue and she stated that the only symptom that was missing from her previous heart attack  was pain in her teeth.  She stated that when she was in the clinic at the beginning of the month her symptoms were nothing with a heart and she feels as though something was wrong and has all of her symptoms are starting to worsen and she is still waiting to hear about having a PET scan completed.  The last heart catheterization that she was in 2019.  Today that she feels as though she needs to have another 1 to get to the bottom of what she is feeling.  She denies any recent hospitalizations or visits to the emergency department  ROS: 10 point review of systems was completed and negative except what is listed in the HPI  Studies Reviewed:    EKG: No new tracings were completed today  TTE 11/27/22 1. Left ventricular ejection fraction, by estimation, is 55 to 60%. The  left ventricle has normal function. The left ventricle has no regional  wall motion abnormalities. Left ventricular diastolic parameters are  consistent with Grade II diastolic  dysfunction (pseudonormalization).   2. Right ventricular systolic function is normal. The right ventricular  size is normal.   3. Left atrial size was mildly dilated.   4. The mitral valve is normal in structure. No evidence of mitral valve  regurgitation.   5. The aortic valve was not well visualized. Aortic valve regurgitation  is not visualized. Moderate aortic valve stenosis. Aortic valve area, by  VTI measures 1.28 cm. Aortic valve mean gradient measures 24.0 mmHg.  Aortic valve Vmax measures 3.38  m/s.   6. The inferior vena cava is normal in size with greater than 50%  respiratory variability, suggesting right atrial pressure of 3 mmHg.    R/LHC 08/2017 Conclusions: Severe native coronary artery disease, including 70% ostial LMCA, 50% proximal LAD, and 100% mid RCA lesions. Atretic LIMA->LAD. Patent SVG->D1; jump portion to OM2 appears chronically occluded. Widely patent SVG->RCA. FFR of ostial LMCA and proximal LAD lesions is not  significant (FFR 0.82), though this may be influenced by some retrograde from via the SVG->D. Mildly elevated left heart, right heart, and pulmonary artery pressures. Normal Fick cardiac output/index. Mild aortic stenosis.   Recommendations: Aggressive medical therapy. I will start isosorbide mononitrate 30 mg daily, which should be escalated as tolerated. If the patient is intolerant of isosorbide mononitrate of has incomplete relief of symptoms, addition of ranolazine and pulmonary consultation should be considered. If marked dyspnea persists despite maximal antianginal therapy, adequate diuresis, weight loss, and pulmonary evaluation, PCI to the LMCA could be considered. Continue secondary prevention, including high-intensity statin therapy and diabetes control.  Risk Assessment/Calculations:              Physical Exam:   VS:  BP 135/72 (BP Location: Right Arm, Patient Position: Sitting, Cuff Size: Large)   Pulse 73   Ht 5\' 5"  (1.651 m)   Wt 252 lb (114.3 kg)   SpO2 96%   BMI 41.93 kg/m    Wt Readings from Last 3 Encounters:  01/02/23 252 lb (114.3 kg)  12/04/22 250 lb 6.4 oz (113.6 kg)  03/06/22 255 lb (115.7 kg)     GEN: Well nourished, well developed in no acute distress NECK: No JVD; No carotid bruits, heart murmur radiates into the carotids L>R CARDIAC: RRR, III/VI systolic murmur, without rubs or gallops RESPIRATORY:  Diminished to auscultation without rales, wheezing or rhonchi  ABDOMEN: Soft, non-tender, non-distended EXTREMITIES:  2+ pitting edema BLE; No deformity   ASSESSMENT AND PLAN:   Coronary artery disease with coronary arteries with exertional anginal anginal equivalents of worsening shortness of breath.  She is status post CABG x 4 in 2013.  Last heart catheterization in 2019 showed 70% ostial CAD, 50% proximal LAD, and 100% mid RCA lesions, atretic LIMA to LAD, patent SVG to D1, [place] portion OM 2 appears chronically occluded, widely patent SVG to RCA.   Patient has a reported worsening and progressive dyspnea on exertion and worsening peripheral edema.  Last echocardiogram was considered stable but she does have significant CAD history.  She is continued on aspirin 81 mg daily, atorvastatin 80 mg daily, Zetia 10 mg daily, metoprolol tartrate 25 mg twice daily, Imdur 60 mg daily.  After further discussion patient states that his symptoms feel as though the symptoms before she ended up after open heart surgery the only symptom at this visit is being directed.  With progressive worsening of symptoms and is being scheduled for right and left heart catheterization with grafts with Dr. Okey Dupre at [facility] on 01/06/2023.  Chronic HFpEF with worsening and progressive exertional dyspnea worsening bilateral lower extremity edema.  Echocardiogram 4/24 showed overall stable heart function and she had planned for cardiac PET to evaluate the possibility of progressive CAD.  Further worsening NYHA class III symptoms.  She is volume up on exam today otherwise.  After procedure is completed can revisit starting SGLT2 inhibitor therapy.  Essential hypertension blood pressure today 135/72.  Blood pressure remained stable.  She is continued on Imdur 60 mg metoprolol titrate 25 mg  twice daily.  Also encouraged to continue to monitor pressures at home as well.  Moderate aortic stenosis with heart murmur 3/6 noted on exam to radiate to the carotids.  Echocardiogram revealed aortic valve gradient 24 mmHg, peak gradient 25.7 mmHg, aortic valve area by VTI measures 1.28 square.  Overall stable study.  Hyperlipidemia with LDL goal less than 70.  Most recent LDL of 71 per PCP labs.  Zetia 10 mg added by PCP she is continued on atorvastatin and ezetimibe .  Disposition patient return to clinic to see MD/APP 1 to 2 weeks postprocedure.    Shared Decision Making/Informed Consent The risks [stroke (1 in 1000), death (1 in 1000), kidney failure [usually temporary] (1 in 500), bleeding (1  in 200), allergic reaction [possibly serious] (1 in 200)], benefits (diagnostic support and management of coronary artery disease) and alternatives of a cardiac catheterization were discussed in detail with Ms. Kreamer and she is willing to proceed.   Signed, Sima Lindenberger, NP

## 2023-01-02 NOTE — Telephone Encounter (Signed)
I contacted pt to review Date and time of Right/Left Heart Care @ [facility] with Dr. Okey Dupre. Pt was made aware of instructions for holding and stopping medications required for heart cath. Pt will be going to get her labs tomorrow morning at [facility] medical mall. Pt agreed and understood detailed AVS that she will be picking up tomorrow after her labs have been drawn. Pt was very appreciative and apologized that she had to leave before her cath could be scheduled with detailed instructions due to having to pick up her grandson. Pt would like to be contacted to schedule f/u appointment.

## 2023-01-02 NOTE — H&P (View-Only) (Signed)
Cardiology Office Note:   Date:  01/02/2023  ID:  Jamie [name], DOB 02/10/1946, MRN [id number] PCP: Spry, Heather M., MD   HeartCare Providers Cardiologist:  Christopher End, MD    History of Present Illness:   Jamie [name] is a 77 y.o. female with a past medical history of coronary artery disease status post CABG (2013), diastolic heart failure, mild to moderate aortic stenosis, hyperlipidemia, carotid artery stenosis, breast cancer status post bilateral mastectomies, diabetes, COPD, sleep apnea, and obesity, who presents for follow-up of coronary artery disease and HFpEF.  She previously suffered from an NSTEMI in May 2013 with diagnostic catheterization revealed severe multivessel disease and underwent CABG x 4.  Stress testing was performed in May 2016 with finding of small area of inferior lateral ischemia.  She was medically managed.  She underwent repeat catheterization in January 2019 in setting of dyspnea on exertion and this revealed an atretic LIMA to the LAD, patent vein graft to the diagonal with an occluded jump portion milligram to the OM 2, and a pink-tan To the RCA.  70% ostial left main 80% proximal LAD stenosis.  FFR was measured to the stenosis were normal at 0.82.  She was placed on isosorbide.  Echocardiogram in March 2022 revealed an EF of 60 to 65%, G2 DD, aortic valve area of 1.04 cm by VTI, suggestive of moderate AS.  She was last seen in clinic 12/04/2022 by E. Williams, PA.  At her appointment she noticed increasing exertional dyspnea, weight, and peripheral edema.  She was scheduled for cardiac PET stress testing at her appointment.  She returns to clinic today with worsening dyspnea on exertion, worsening peripheral edema but she is having leg pain from swelling, stated that she had an episode where both of her arms had gone with her worsening shortness of breath she had fatigue and she stated that the only symptom that was missing from her previous heart attack  was pain in her teeth.  She stated that when she was in the clinic at the beginning of the month her symptoms were nothing with a heart and she feels as though something was wrong and has all of her symptoms are starting to worsen and she is still waiting to hear about having a PET scan completed.  The last heart catheterization that she was in 2019.  Today that she feels as though she needs to have another 1 to get to the bottom of what she is feeling.  She denies any recent hospitalizations or visits to the emergency department  ROS: 10 point review of systems was completed and negative except what is listed in the HPI  Studies Reviewed:    EKG: No new tracings were completed today  TTE 11/27/22 1. Left ventricular ejection fraction, by estimation, is 55 to 60%. The  left ventricle has normal function. The left ventricle has no regional  wall motion abnormalities. Left ventricular diastolic parameters are  consistent with Grade II diastolic  dysfunction (pseudonormalization).   2. Right ventricular systolic function is normal. The right ventricular  size is normal.   3. Left atrial size was mildly dilated.   4. The mitral valve is normal in structure. No evidence of mitral valve  regurgitation.   5. The aortic valve was not well visualized. Aortic valve regurgitation  is not visualized. Moderate aortic valve stenosis. Aortic valve area, by  VTI measures 1.28 cm. Aortic valve mean gradient measures 24.0 mmHg.  Aortic valve Vmax measures 3.38   m/s.   6. The inferior vena cava is normal in size with greater than 50%  respiratory variability, suggesting right atrial pressure of 3 mmHg.    R/LHC 08/2017 Conclusions: Severe native coronary artery disease, including 70% ostial LMCA, 50% proximal LAD, and 100% mid RCA lesions. Atretic LIMA->LAD. Patent SVG->D1; jump portion to OM2 appears chronically occluded. Widely patent SVG->RCA. FFR of ostial LMCA and proximal LAD lesions is not  significant (FFR 0.82), though this may be influenced by some retrograde from via the SVG->D. Mildly elevated left heart, right heart, and pulmonary artery pressures. Normal Fick cardiac output/index. Mild aortic stenosis.   Recommendations: Aggressive medical therapy. I will start isosorbide mononitrate 30 mg daily, which should be escalated as tolerated. If the patient is intolerant of isosorbide mononitrate of has incomplete relief of symptoms, addition of ranolazine and pulmonary consultation should be considered. If marked dyspnea persists despite maximal antianginal therapy, adequate diuresis, weight loss, and pulmonary evaluation, PCI to the LMCA could be considered. Continue secondary prevention, including high-intensity statin therapy and diabetes control.  Risk Assessment/Calculations:              Physical Exam:   VS:  BP 135/72 (BP Location: Right Arm, Patient Position: Sitting, Cuff Size: Large)   Pulse 73   Ht 5' 5" (1.651 m)   Wt 252 lb (114.3 kg)   SpO2 96%   BMI 41.93 kg/m    Wt Readings from Last 3 Encounters:  01/02/23 252 lb (114.3 kg)  12/04/22 250 lb 6.4 oz (113.6 kg)  03/06/22 255 lb (115.7 kg)     GEN: Well nourished, well developed in no acute distress NECK: No JVD; No carotid bruits, heart murmur radiates into the carotids L>R CARDIAC: RRR, III/VI systolic murmur, without rubs or gallops RESPIRATORY:  Diminished to auscultation without rales, wheezing or rhonchi  ABDOMEN: Soft, non-tender, non-distended EXTREMITIES:  2+ pitting edema BLE; No deformity   ASSESSMENT AND PLAN:   Coronary artery disease with coronary arteries with exertional anginal anginal equivalents of worsening shortness of breath.  She is status post CABG x 4 in 2013.  Last heart catheterization in 2019 showed 70% ostial CAD, 50% proximal LAD, and 100% mid RCA lesions, atretic LIMA to LAD, patent SVG to D1, [place] portion OM 2 appears chronically occluded, widely patent SVG to RCA.   Patient has a reported worsening and progressive dyspnea on exertion and worsening peripheral edema.  Last echocardiogram was considered stable but she does have significant CAD history.  She is continued on aspirin 81 mg daily, atorvastatin 80 mg daily, Zetia 10 mg daily, metoprolol tartrate 25 mg twice daily, Imdur 60 mg daily.  After further discussion patient states that his symptoms feel as though the symptoms before she ended up after open heart surgery the only symptom at this visit is being directed.  With progressive worsening of symptoms and is being scheduled for right and left heart catheterization with grafts with Dr. End at [facility] on 01/06/2023.  Chronic HFpEF with worsening and progressive exertional dyspnea worsening bilateral lower extremity edema.  Echocardiogram 4/24 showed overall stable heart function and she had planned for cardiac PET to evaluate the possibility of progressive CAD.  Further worsening NYHA class III symptoms.  She is volume up on exam today otherwise.  After procedure is completed can revisit starting SGLT2 inhibitor therapy.  Essential hypertension blood pressure today 135/72.  Blood pressure remained stable.  She is continued on Imdur 60 mg metoprolol titrate 25 mg   twice daily.  Also encouraged to continue to monitor pressures at home as well.  Moderate aortic stenosis with heart murmur 3/6 noted on exam to radiate to the carotids.  Echocardiogram revealed aortic valve gradient 24 mmHg, peak gradient 25.7 mmHg, aortic valve area by VTI measures 1.28 square.  Overall stable study.  Hyperlipidemia with LDL goal less than 70.  Most recent LDL of 71 per PCP labs.  Zetia 10 mg added by PCP she is continued on atorvastatin and ezetimibe .  Disposition patient return to clinic to see MD/APP 1 to 2 weeks postprocedure.    Shared Decision Making/Informed Consent The risks [stroke (1 in 1000), death (1 in 1000), kidney failure [usually temporary] (1 in 500), bleeding (1  in 200), allergic reaction [possibly serious] (1 in 200)], benefits (diagnostic support and management of coronary artery disease) and alternatives of a cardiac catheterization were discussed in detail with Jamie [name] and she is willing to proceed.   Signed, Anntionette Madkins, NP   

## 2023-01-03 ENCOUNTER — Telehealth: Payer: Self-pay | Admitting: *Deleted

## 2023-01-03 ENCOUNTER — Other Ambulatory Visit
Admission: RE | Admit: 2023-01-03 | Discharge: 2023-01-03 | Disposition: A | Discharge status: Home / Self Care | Payer: Medicare Other | Attending: Cardiology | Admitting: Cardiology

## 2023-01-03 DIAGNOSIS — I25118 Atherosclerotic heart disease of native coronary artery with other forms of angina pectoris: Secondary | ICD-10-CM | POA: Insufficient documentation

## 2023-01-03 DIAGNOSIS — I350 Nonrheumatic aortic (valve) stenosis: Secondary | ICD-10-CM | POA: Insufficient documentation

## 2023-01-03 DIAGNOSIS — I2510 Atherosclerotic heart disease of native coronary artery without angina pectoris: Secondary | ICD-10-CM | POA: Diagnosis present

## 2023-01-03 DIAGNOSIS — I5032 Chronic diastolic (congestive) heart failure: Secondary | ICD-10-CM | POA: Diagnosis present

## 2023-01-03 DIAGNOSIS — R0609 Other forms of dyspnea: Secondary | ICD-10-CM | POA: Insufficient documentation

## 2023-01-03 DIAGNOSIS — E785 Hyperlipidemia, unspecified: Secondary | ICD-10-CM | POA: Insufficient documentation

## 2023-01-03 DIAGNOSIS — I10 Essential (primary) hypertension: Secondary | ICD-10-CM | POA: Diagnosis present

## 2023-01-03 DIAGNOSIS — I6523 Occlusion and stenosis of bilateral carotid arteries: Secondary | ICD-10-CM | POA: Insufficient documentation

## 2023-01-03 LAB — BASIC METABOLIC PANEL
Anion gap: 10 (ref 5–15)
BUN: 15 mg/dL (ref 8–23)
CO2: 26 mmol/L (ref 22–32)
Calcium: 9.1 mg/dL (ref 8.9–10.3)
Chloride: 103 mmol/L (ref 98–111)
Creatinine, Ser: 1.11 mg/dL — ABNORMAL HIGH (ref 0.44–1.00)
GFR, Estimated: 51 mL/min — ABNORMAL LOW (ref >60)
Glucose, Bld: 212 mg/dL — ABNORMAL HIGH (ref 70–99)
Potassium: 3.6 mmol/L (ref 3.5–5.1)
Sodium: 139 mmol/L (ref 135–145)

## 2023-01-03 LAB — CBC
HCT: 44.1 % (ref 36.0–46.0)
Hemoglobin: 14.2 g/dL (ref 12.0–15.0)
MCH: 29 pg (ref 26.0–34.0)
MCHC: 32.2 g/dL (ref 30.0–36.0)
MCV: 90.2 fL (ref 80.0–100.0)
Platelets: 164 10*3/uL (ref 150–400)
RBC: 4.89 MIL/uL (ref 3.87–5.11)
RDW: 13.2 % (ref 11.5–15.5)
WBC: 5.5 10*3/uL (ref 4.0–10.5)
nRBC: 0.4 % — ABNORMAL HIGH (ref 0.0–0.2)

## 2023-01-03 NOTE — Telephone Encounter (Signed)
I  have been able to reach patient by telephone to review instructions, will send MyChart message with instructions.  [facility] A DEPT OF [place]. [facility] AT [facility] [address] [place], [place] [address] [id number] [facility] [place] [address] Dept: [phone] Loc: [phone]  Jamie [name]    You are scheduled for a Cardiac Catheterization on Monday, June 3 with Dr. Cristal Deer End.  -Please arrive at the [facility] (Main Entrance A) at [facility]: [address] [place], [place] [address] at 7:00 AM (This time is 2 hour(s) before your procedure to ensure your preparation). Free valet parking service is available. You will check in at ADMITTING. The support person will be asked to wait in the waiting room.  It is OK to have someone drop you off and come back when you are ready to be discharged.    Special note: Every effort is made to have your procedure done on time. Please understand that emergencies sometimes delay scheduled procedures.  -Diet: Do not eat solid foods after midnight prior to procedure. You may have clear liquids until 5 AM  the day of the procedure.  - Medication instructions in preparation for your procedure:  Do not take Metformin (glucophage) the day of the procedure and 48 hours after the procedure.  Do not take Lasix (furosemide) and Lisinopril (Zestril) the day before and day of procedure.  -On the morning of your procedure, take aspirin 81 mg and any morning medicines NOT listed above.  You may use sips of water.  - Plan to go home the same day, you will only stay overnight if medically necessary. -. Bring a current list of your medications and current insurance cards. -You MUST have a responsible person to drive you home. -Someone MUST be with you the first 24 hours after you arrive home or your discharge will be delayed. - Please wear clothes that are easy to get on and off and wear slip-on  shoes.  Thank you for allowing [place] to care for you!   -- [name]

## 2023-01-03 NOTE — Progress Notes (Signed)
Pre-procedure labs Kidney function remains stable, blood glucose is elevated, blood counts are stable

## 2023-01-06 ENCOUNTER — Other Ambulatory Visit: Payer: Self-pay

## 2023-01-06 ENCOUNTER — Encounter (HOSPITAL_COMMUNITY)
Admission: RE | Disposition: A | Discharge status: Home / Self Care | Payer: Self-pay | Source: Ambulatory Visit | Attending: Internal Medicine

## 2023-01-06 ENCOUNTER — Ambulatory Visit (HOSPITAL_COMMUNITY)
Admission: RE | Admit: 2023-01-06 | Discharge: 2023-01-06 | Disposition: A | Discharge status: Home / Self Care | Payer: Medicare Other | Source: Ambulatory Visit | Attending: Internal Medicine | Admitting: Internal Medicine

## 2023-01-06 DIAGNOSIS — Z9013 Acquired absence of bilateral breasts and nipples: Secondary | ICD-10-CM | POA: Insufficient documentation

## 2023-01-06 DIAGNOSIS — G4730 Sleep apnea, unspecified: Secondary | ICD-10-CM | POA: Diagnosis not present

## 2023-01-06 DIAGNOSIS — E119 Type 2 diabetes mellitus without complications: Secondary | ICD-10-CM | POA: Diagnosis not present

## 2023-01-06 DIAGNOSIS — Z853 Personal history of malignant neoplasm of breast: Secondary | ICD-10-CM | POA: Diagnosis not present

## 2023-01-06 DIAGNOSIS — E785 Hyperlipidemia, unspecified: Secondary | ICD-10-CM | POA: Diagnosis not present

## 2023-01-06 DIAGNOSIS — Z951 Presence of aortocoronary bypass graft: Secondary | ICD-10-CM | POA: Insufficient documentation

## 2023-01-06 DIAGNOSIS — I252 Old myocardial infarction: Secondary | ICD-10-CM | POA: Diagnosis not present

## 2023-01-06 DIAGNOSIS — Z7982 Long term (current) use of aspirin: Secondary | ICD-10-CM | POA: Insufficient documentation

## 2023-01-06 DIAGNOSIS — E669 Obesity, unspecified: Secondary | ICD-10-CM | POA: Diagnosis not present

## 2023-01-06 DIAGNOSIS — I25118 Atherosclerotic heart disease of native coronary artery with other forms of angina pectoris: Secondary | ICD-10-CM | POA: Diagnosis present

## 2023-01-06 DIAGNOSIS — I350 Nonrheumatic aortic (valve) stenosis: Secondary | ICD-10-CM | POA: Insufficient documentation

## 2023-01-06 DIAGNOSIS — I2510 Atherosclerotic heart disease of native coronary artery without angina pectoris: Secondary | ICD-10-CM | POA: Diagnosis not present

## 2023-01-06 DIAGNOSIS — Z79899 Other long term (current) drug therapy: Secondary | ICD-10-CM | POA: Diagnosis not present

## 2023-01-06 DIAGNOSIS — J449 Chronic obstructive pulmonary disease, unspecified: Secondary | ICD-10-CM | POA: Diagnosis not present

## 2023-01-06 DIAGNOSIS — Z6841 Body Mass Index (BMI) 40.0 and over, adult: Secondary | ICD-10-CM | POA: Insufficient documentation

## 2023-01-06 DIAGNOSIS — I110 Hypertensive heart disease with heart failure: Secondary | ICD-10-CM | POA: Insufficient documentation

## 2023-01-06 DIAGNOSIS — I5032 Chronic diastolic (congestive) heart failure: Secondary | ICD-10-CM | POA: Diagnosis not present

## 2023-01-06 DIAGNOSIS — R0600 Dyspnea, unspecified: Secondary | ICD-10-CM | POA: Diagnosis not present

## 2023-01-06 HISTORY — PX: RIGHT/LEFT HEART CATH AND CORONARY/GRAFT ANGIOGRAPHY: CATH118267

## 2023-01-06 LAB — POCT I-STAT 7, (LYTES, BLD GAS, ICA,H+H)
Acid-Base Excess: 2 mmol/L (ref 0.0–2.0)
Bicarbonate: 26.6 mmol/L (ref 20.0–28.0)
Calcium, Ion: 1.23 mmol/L (ref 1.15–1.40)
HCT: 37 % (ref 36.0–46.0)
Hemoglobin: 12.6 g/dL (ref 12.0–15.0)
O2 Saturation: 96 %
Potassium: 3.8 mmol/L (ref 3.5–5.1)
Sodium: 141 mmol/L (ref 135–145)
TCO2: 28 mmol/L (ref 22–32)
pCO2 arterial: 40.4 mmHg (ref 32–48)
pH, Arterial: 7.427 (ref 7.35–7.45)
pO2, Arterial: 79 mmHg — ABNORMAL LOW (ref 83–108)

## 2023-01-06 LAB — POCT I-STAT EG7
Acid-Base Excess: 3 mmol/L — ABNORMAL HIGH (ref 0.0–2.0)
Bicarbonate: 29.2 mmol/L — ABNORMAL HIGH (ref 20.0–28.0)
Calcium, Ion: 1.21 mmol/L (ref 1.15–1.40)
HCT: 38 % (ref 36.0–46.0)
Hemoglobin: 12.9 g/dL (ref 12.0–15.0)
O2 Saturation: 70 %
Potassium: 3.8 mmol/L (ref 3.5–5.1)
Sodium: 142 mmol/L (ref 135–145)
TCO2: 31 mmol/L (ref 22–32)
pCO2, Ven: 49.8 mmHg (ref 44–60)
pH, Ven: 7.377 (ref 7.25–7.43)
pO2, Ven: 38 mmHg (ref 32–45)

## 2023-01-06 LAB — GLUCOSE, CAPILLARY: Glucose-Capillary: 105 mg/dL — ABNORMAL HIGH (ref 70–99)

## 2023-01-06 SURGERY — RIGHT/LEFT HEART CATH AND CORONARY/GRAFT ANGIOGRAPHY
Anesthesia: LOCAL

## 2023-01-06 MED ORDER — HYDRALAZINE HCL 20 MG/ML IJ SOLN: 10.0000 mg | INTRAMUSCULAR | Status: DC | PRN

## 2023-01-06 MED ORDER — FUROSEMIDE 40 MG PO TABS: 40.0000 mg | ORAL_TABLET | Freq: Two times a day (BID) | ORAL | 5 refills | Status: DC

## 2023-01-06 MED ORDER — LIDOCAINE HCL (PF) 1 % IJ SOLN
INTRAMUSCULAR | Status: AC
  Filled 2023-01-06: qty 30

## 2023-01-06 MED ORDER — IOHEXOL 350 MG/ML SOLN
INTRAVENOUS | Status: DC | PRN
  Administered 2023-01-06: 95 mL

## 2023-01-06 MED ORDER — MIDAZOLAM HCL 2 MG/2ML IJ SOLN
INTRAMUSCULAR | Status: AC
  Filled 2023-01-06: qty 2

## 2023-01-06 MED ORDER — LABETALOL HCL 5 MG/ML IV SOLN: 10.0000 mg | INTRAVENOUS | Status: DC | PRN

## 2023-01-06 MED ORDER — VERAPAMIL HCL 2.5 MG/ML IV SOLN
INTRAVENOUS | Status: AC
  Filled 2023-01-06: qty 2

## 2023-01-06 MED ORDER — SODIUM CHLORIDE 0.9 % IV SOLN: 250.0000 mL | INTRAVENOUS | Status: DC | PRN

## 2023-01-06 MED ORDER — SODIUM CHLORIDE 0.9 % IV SOLN: INTRAVENOUS | Status: DC

## 2023-01-06 MED ORDER — SODIUM CHLORIDE 0.9% FLUSH: 3.0000 mL | INTRAVENOUS | Status: DC | PRN

## 2023-01-06 MED ORDER — HEPARIN SODIUM (PORCINE) 1000 UNIT/ML IJ SOLN
INTRAMUSCULAR | Status: DC | PRN
  Administered 2023-01-06: 5000 [IU] via INTRAVENOUS

## 2023-01-06 MED ORDER — VERAPAMIL HCL 2.5 MG/ML IV SOLN
INTRAVENOUS | Status: DC | PRN
  Administered 2023-01-06: 10 mL via INTRA_ARTERIAL

## 2023-01-06 MED ORDER — METOPROLOL TARTRATE 50 MG PO TABS: 50.0000 mg | ORAL_TABLET | Freq: Two times a day (BID) | ORAL | 5 refills | Status: DC

## 2023-01-06 MED ORDER — METFORMIN HCL 500 MG PO TABS: 500.0000 mg | ORAL_TABLET | Freq: Every evening | ORAL | Status: AC

## 2023-01-06 MED ORDER — HEPARIN (PORCINE) IN NACL 1000-0.9 UT/500ML-% IV SOLN
INTRAVENOUS | Status: DC | PRN
  Administered 2023-01-06 (×2): 500 mL

## 2023-01-06 MED ORDER — ONDANSETRON HCL 4 MG/2ML IJ SOLN: 4.0000 mg | Freq: Four times a day (QID) | INTRAMUSCULAR | Status: DC | PRN

## 2023-01-06 MED ORDER — HEPARIN SODIUM (PORCINE) 1000 UNIT/ML IJ SOLN
INTRAMUSCULAR | Status: AC
  Filled 2023-01-06: qty 10

## 2023-01-06 MED ORDER — MIDAZOLAM HCL 2 MG/2ML IJ SOLN
INTRAMUSCULAR | Status: DC | PRN
  Administered 2023-01-06: 1 mg via INTRAVENOUS

## 2023-01-06 MED ORDER — ACETAMINOPHEN 325 MG PO TABS: 650.0000 mg | ORAL_TABLET | ORAL | Status: DC | PRN

## 2023-01-06 MED ORDER — FENTANYL CITRATE (PF) 100 MCG/2ML IJ SOLN
INTRAMUSCULAR | Status: AC
  Filled 2023-01-06: qty 2

## 2023-01-06 MED ORDER — FENTANYL CITRATE (PF) 100 MCG/2ML IJ SOLN
INTRAMUSCULAR | Status: DC | PRN
  Administered 2023-01-06: 25 ug via INTRAVENOUS

## 2023-01-06 MED ORDER — ISOSORBIDE MONONITRATE ER 60 MG PO TB24: 60.0000 mg | ORAL_TABLET | Freq: Every day | ORAL | 0 refills | Status: DC

## 2023-01-06 MED ORDER — ASPIRIN 81 MG PO CHEW: 81.0000 mg | CHEWABLE_TABLET | ORAL | Status: DC

## 2023-01-06 MED ORDER — LIDOCAINE HCL (PF) 1 % IJ SOLN
INTRAMUSCULAR | Status: DC | PRN
  Administered 2023-01-06 (×2): 2 mL

## 2023-01-06 MED ORDER — SODIUM CHLORIDE 0.9% FLUSH: 3.0000 mL | Freq: Two times a day (BID) | INTRAVENOUS | Status: DC

## 2023-01-06 SURGICAL SUPPLY — 15 items
BAND CMPR LRG ZPHR (HEMOSTASIS) ×1
BAND ZEPHYR COMPRESS 30 LONG (HEMOSTASIS) IMPLANT
CATH 5FR JL3.5 JR4 ANG PIG MP (CATHETERS) IMPLANT
CATH BALLN WEDGE 5F 110CM (CATHETERS) IMPLANT
CATH INFINITI 5FR AL1 (CATHETERS) IMPLANT
CATH LANGSTON DUAL LUM PIG 6FR (CATHETERS) IMPLANT
GLIDESHEATH SLEND SS 6F .021 (SHEATH) IMPLANT
GUIDEWIRE INQWIRE 1.5J.035X260 (WIRE) IMPLANT
INQWIRE 1.5J .035X260CM (WIRE) ×1
KIT HEART LEFT (KITS) ×1 IMPLANT
PACK CARDIAC CATHETERIZATION (CUSTOM PROCEDURE TRAY) ×1 IMPLANT
SHEATH GLIDE SLENDER 4/5FR (SHEATH) IMPLANT
SHEATH PROBE COVER 6X72 (BAG) IMPLANT
TRANSDUCER W/STOPCOCK (MISCELLANEOUS) ×1 IMPLANT
TUBING CIL FLEX 10 FLL-RA (TUBING) ×1 IMPLANT

## 2023-01-06 NOTE — Discharge Instructions (Addendum)

## 2023-01-06 NOTE — Brief Op Note (Signed)
BRIEF CARDIAC CATHETERIZATION NOTE  01/06/2023  9:26 AM  PATIENT:  Jamie [name]  76 y.o. female  PRE-OPERATIVE DIAGNOSIS:  Coronary artery disease with stable angina, aortic stenosis, and dyspnea on exertion  POST-OPERATIVE DIAGNOSIS:  Same  PROCEDURE:  Procedure(s): RIGHT/LEFT HEART CATH AND CORONARY/GRAFT ANGIOGRAPHY (N/A)  SURGEON:  Surgeon(s) and Role:    * Felica Chargois, MD - Primary  FINDINGS: Severe ostial LMCA disease and chronic total occlusion of mid RCA.  There is also moderate disease of mid LAD.  Overall appearance is similar to prior cath in 2019. Widely patent SVG-D1 with chronic total occlusion of sequential portion to OM. Widely patent SVG-RCA. Small LIMA-LAD that has partial retrograde filling from the LAD (known to be atretic by prior cath). Mildly elevated left and right heart filling pressures. Normal Fick cardiac output/index. Severe aortic valve stenosis (can't exclude some element of subvalvular/LVOT gradient).  RECOMMENDATIONS: Increase diuresis. Increase metoprolol and reduce isosorbide mononitrate. Aggressive secondary prevention of coronary artery disease.  Yvonne Kendall, MD [facility]

## 2023-01-06 NOTE — Interval H&P Note (Signed)
History and Physical Interval Note:  01/06/2023 7:46 AM  Alexiya Regino Schultze  has presented today for surgery, with the diagnosis of coronary artery disease with stable angina, aortic stenosis, and shortness of breath.  The various methods of treatment have been discussed with the patient and family. After consideration of risks, benefits and other options for treatment, the patient has consented to  Procedure(s): RIGHT/LEFT HEART CATH AND CORONARY/GRAFT ANGIOGRAPHY (N/A) as a surgical intervention.  The patient's history has been reviewed, patient examined, no change in status, stable for surgery.  I have reviewed the patient's chart and labs.  Questions were answered to the patient's satisfaction.    Cath Lab Visit (complete for each Cath Lab visit)  Clinical Evaluation Leading to the Procedure:   ACS: No.  Non-ACS:    Anginal/Heart Failure Classification: NYHA class III  Anti-ischemic medical therapy: Maximal Therapy (2 or more classes of medications)  Non-Invasive Test Results: No non-invasive testing performed  Prior CABG: Previous CABG  Khalaya Mcgurn

## 2023-01-06 NOTE — Telephone Encounter (Signed)
Pt has been scheduled for future appointment 01/24/2023 @ 10:05 AM with Ward Givens, NP.

## 2023-01-07 ENCOUNTER — Encounter (HOSPITAL_COMMUNITY): Payer: Self-pay | Admitting: Internal Medicine

## 2023-01-21 ENCOUNTER — Other Ambulatory Visit: Payer: Self-pay | Admitting: Internal Medicine

## 2023-01-23 ENCOUNTER — Encounter: Payer: Self-pay | Admitting: Nurse Practitioner

## 2023-01-23 NOTE — Progress Notes (Signed)
Office Visit    Patient Name: Jamie [name] Date of Encounter: 01/24/2023  Primary Care Provider:  Raynelle Jan., MD Primary Cardiologist:  Yvonne Kendall, MD  Chief Complaint    77 y.o. y/o female with a history of CAD, hyperlipidemia, diabetes, COPD, carotid arterial disease, mild to moderate aortic stenosis, sleep apnea, obesity, and breast cancer status post bilateral mastectomies, who presents for follow-up after recent cath.  Past Medical History    Past Medical History:  Diagnosis Date   Anxiety    Aortic stenosis    a. 12/2014 Echo:  EF 60-65%, no RWMA, Gr 1 DD, Ao sclerosis, mild AS, mean 14 mmHg, MAC; b. 10/2020 Echo: EF 60-65%, no rwma, GrII DD, nl RV fxn, mildly dil LA, Mild-mod AS; c. 11/2022 Echo: EF 55-60%, mod AS (AVA 1.28cm^2); d. Cath: Mean gradient , AVA 0.7 cm^2.   Asthma    Breast cancer (HCC) 08/2011   lt. breast ca   CAD (coronary artery disease)    a. 12/2011 NSTEMI/Cath: Sev 3VD; b. 12/2011 CABG x4: LIMA-LAD, SVG-RCA, SVG-D1-->OM2; c. 08/2017 Cath: Sev native dzs, LIMA->LAD atretic, VG->D1 ok w/ 100 @ continuation to OM2, VG->dRCA ok-->Med rx; c. 01/2023 Cath: LM 70ost, LAD 50p, LCX nl, RCA 131m, VG->dRCA nl, VG->D1 ok w/ 100% continuation to OM2, LIMA->LAD atretic-->overall unchanged->Med Rx.   Carotid artery disease (HCC)    a.  Pre-CABG Dopplers 5/13: Bilateral 40-59%; b.  Carotid US 6/16: RICA 40-59%, LICA 60-79%, normal subclavians bilaterally;  c. Carotid US 11/16: RICA 40-59%; LICA 40-59%, normal subclavians; d. Carotid US 11/17:  Stable 40-59% bilateral ICA stenoses; e. 07/2017 Carotid U/S: 1-39% bilat ICA stenoses.   Chronic heart failure with preserved ejection fraction (HFpEF) (HCC)    a. 10/2020 Echo: EF 60-65%, GrII DD; b. 11/2022 Echo: EF 55-60%, no rwma, GrII DD.   COPD (chronic obstructive pulmonary disease) (HCC)    DM2 (diabetes mellitus, type 2) (HCC)    metformin   Gallstones    Symptomatic   HLD (hyperlipidemia)    Hx of  cardiovascular stress test    Myoview 5/16:  small area of inf-lat ischemia, EF 70%; Low Risk >> try medical Rx   Hx of migraines    Hypertension    Myocardial infarction (HCC)    Obesity    Shortness of breath    Sleep apnea    sleep study2-3 yrs ago lost weight afterward but gained back   UTI (lower urinary tract infection)    freq-bladder implant -removed   Past Surgical History:  Procedure Laterality Date   ABDOMINAL HYSTERECTOMY  1977   APPENDECTOMY     AXILLARY LYMPH NODE DISSECTION  10/18/2011   Procedure: AXILLARY LYMPH NODE DISSECTION;  Surgeon: Robyne Askew, MD;  Location: [facility] OR;  Service: General;  Laterality: Left;   BREAST RECONSTRUCTION  10/18/2011   Procedure: BREAST RECONSTRUCTION;  Surgeon: Wayland Denis, DO;  Location: [facility] OR;  Service: Plastics;  Laterality: Bilateral;   bilateral breast reconstruction with bilateral tissue expander and placement of flex hd.   CARDIAC CATHETERIZATION     CESAREAN SECTION  04/1976   CHOLECYSTECTOMY N/A 08/17/2018   Procedure: LAPAROSCOPIC CHOLECYSTECTOMY;  Surgeon: Abigail Miyamoto, MD;  Location: [facility] OR;  Service: General;  Laterality: N/A;   COLONOSCOPY WITH PROPOFOL N/A 06/20/2019   Procedure: COLONOSCOPY WITH PROPOFOL;  Surgeon: Jeani Hawking, MD;  Location: [facility] ENDOSCOPY;  Service: Endoscopy;  Laterality: N/A;   CORONARY ARTERY BYPASS GRAFT  12/13/2011  Procedure: CORONARY ARTERY BYPASS GRAFTING (CABG);  Surgeon: Alleen Borne, MD;  Location: [facility] OR;  Service: Open Heart Surgery;  Laterality: N/A;  Times four using endoscopically harvested left greater saphenous vein and left internal mammary artery. Right greater saphenous vein attempted; not appropriate for vein harvest.   CORONARY PRESSURE/FFR STUDY N/A 08/22/2017   Procedure: INTRAVASCULAR PRESSURE WIRE/FFR STUDY;  Surgeon: Yvonne Kendall, MD;  Location: [facility] INVASIVE CV LAB;  Service: Cardiovascular;  Laterality: N/A;   EYE SURGERY     bilateral cataract extraction     LAPAROSCOPIC GASTRIC BANDING  2010   LEFT HEART CATHETERIZATION WITH CORONARY ANGIOGRAM N/A 12/11/2011   Procedure: LEFT HEART CATHETERIZATION WITH CORONARY ANGIOGRAM;  Surgeon: Laurey Morale, MD;  Location: [facility] CATH LAB;  Service: Cardiovascular;  Laterality: N/A;   LESION REMOVAL  04/09/2012   Procedure: LESION REMOVAL;  Surgeon: Wayland Denis, DO;  Location: [facility];  Service: Plastics;  Laterality: Bilateral;   MASTECTOMY W/ SENTINEL NODE BIOPSY  10/18/2011   Procedure: MASTECTOMY WITH SENTINEL LYMPH NODE BIOPSY;  Surgeon: Robyne Askew, MD;  Location: [facility] OR;  Service: General;  Laterality: Bilateral;  bilateral mastectomy and left sentinel node biopsy   NASAL SINUS SURGERY     POLYPECTOMY  06/20/2019   Procedure: POLYPECTOMY;  Surgeon: Jeani Hawking, MD;  Location: [facility] ENDOSCOPY;  Service: Endoscopy;;   PORT-A-CATH REMOVAL  04/09/2012   Procedure: REMOVAL PORT-A-CATH;  Surgeon: Robyne Askew, MD;  Location: [facility];  Service: General;  Laterality: Right;   PORTACATH PLACEMENT  12/02/2011   Procedure: INSERTION PORT-A-CATH;  Surgeon: Robyne Askew, MD;  Location: [facility] OR;  Service: General;  Laterality: Right;   RIGHT/LEFT HEART CATH AND CORONARY/GRAFT ANGIOGRAPHY N/A 08/22/2017   Procedure: RIGHT/LEFT HEART CATH AND CORONARY/GRAFT ANGIOGRAPHY;  Surgeon: Yvonne Kendall, MD;  Location: [facility] INVASIVE CV LAB;  Service: Cardiovascular;  Laterality: N/A;   RIGHT/LEFT HEART CATH AND CORONARY/GRAFT ANGIOGRAPHY N/A 01/06/2023   Procedure: RIGHT/LEFT HEART CATH AND CORONARY/GRAFT ANGIOGRAPHY;  Surgeon: Yvonne Kendall, MD;  Location: [facility] INVASIVE CV LAB;  Service: Cardiovascular;  Laterality: N/A;   TONSILLECTOMY     US ECHOCARDIOGRAPHY     at [facility]   VIDEO BRONCHOSCOPY WITH ENDOBRONCHIAL NAVIGATION Left 11/26/2013   Procedure: VIDEO BRONCHOSCOPY WITH ENDOBRONCHIAL NAVIGATION;  Surgeon: Leslye Peer, MD;  Location: [facility] OR;  Service: Thoracic;  Laterality: Left;     Allergies  Allergies  Allergen Reactions   Penicillins Nausea Only and Other (See Comments)    DID THE REACTION INVOLVE: Swelling of the face/tongue/throat, SOB, or low BP? No Sudden or severe rash/hives, skin peeling, or the inside of the mouth or nose? No Did it require medical treatment? #  #  #  YES  #  #  #  When did it last happen? Unknown    Vicodin [Hydrocodone-Acetaminophen] Other (See Comments)    Syncope    Duloxetine Hcl Nausea Only   Sulfa Antibiotics Rash   Sulfamethoxazole Rash    History of Present Illness      77 y.o. y/o female with above past medical history including CAD, hyperlipidemia, diabetes, HFpEF, COPD, carotid arterial disease, mild to moderate aortic stenosis, sleep apnea, obesity, and breast cancer status post bilateral mastectomies.  She previously suffered a non-STEMI in May 2013 with diagnostic catheterization revealing severe multivessel disease, she underwent CABG x4.  Stress testing was performed in May 2016 with finding of a small area of inferolateral ischemia.  She was medically managed.  She underwent repeat catheterization in January 2019 in the setting of dyspnea on exertion and this revealed an atretic LIMA to the LAD, patent vein graft to the diagonal with an occluded jump portion of the graft to the OM 2, and a patent vein graft to the RCA.  She had a 70% ostial left main and 50% proximal LAD stenosis.  FFR was measured through these stenoses, and was normal at 0.82.  She was placed on isosorbide therapy.  Mr. Elesa Massed is also being followed for aortic stenosis with most recent echo in April 2024 showing moderate aortic stenosis with a valve area of 1.28 cm   At Dec 04, 2022 office visit, patient complained of worsening dyspnea on exertion.  Lexiscan PET/CT was ordered to evaluate for ischemia however, prior to this being carried out, she was seen back in clinic on Jan 02, 2023 with worsening dyspnea, edema, and arm discomfort similar to prior  anginal symptoms.  In that setting, arrangements were made for diagnostic catheterization, which was performed on June 3, revealing stable, multivessel native coronary artery disease with patent vein graft to the distal RCA, patent vein graft to the D1 with known occlusion of the continuation to the OM 2, and atretic LIMA to the LAD.  Overall, coronary anatomy was stable.  Filling pressures were elevated with a wedge of 20 and PA 43/20 with normal cardiac output and index.  EF was 65% with a mean gradient of 56 mmHg and calculated valve area of 0.7 cm.  In that setting, outpatient diuretic dosing was increased as was beta-blocker dose while nitrate dose was reduced.  Unfortunately, Ms. Jeannot has continued to experience dyspnea with minimal exertion.  She reports good response to Lasix however, her weight is slightly up since her last visit, and she continues to have trace to 1+ bilateral lower extremity edema associated with lower extremity tenderness.  She had 1 episode of chest discomfort and indigestion that occurred while eating a week or so ago.  She did take a nitroglycerin with complete relief.  She has had no recurrent symptoms like this.  She sleeps in a recliner chronically.  She denies palpitations, PND, dizziness, syncope, or early satiety  Home Medications    Prior to Admission medications   Medication Sig Start Date End Date Taking? Authorizing Provider  albuterol (PROVENTIL) (2.5 MG/3ML) 0.083% nebulizer solution Inhale 3 mLs into the lungs every 6 (six) hours as needed for wheezing or shortness of breath. 02/19/18  Yes [provider]  albuterol (VENTOLIN HFA) 108 (90 Base) MCG/ACT inhaler Inhale 1-2 puffs into the lungs every 6 (six) hours as needed for wheezing or shortness of breath.   Yes [provider]  aspirin EC 81 MG tablet Take 1 tablet (81 mg total) by mouth daily. Patient taking differently: Take 81 mg by mouth every evening. 04/26/16  Yes Weaver, Scott T, PA-C   atorvastatin (LIPITOR) 80 MG tablet TAKE 1 TABLET BY MOUTH ONCE DAILY AT 6 PM 12/12/22  Yes End, Cristal Deer, MD  cephALEXin (KEFLEX) 500 MG capsule Take 500 mg by mouth in the morning. 03/05/17  Yes [provider]  Cholecalciferol (VITAMIN D3) 1000 units CAPS Take 1,000 Units by mouth in the morning.   Yes [provider]  ezetimibe (ZETIA) 10 MG tablet Take 10 mg by mouth in the morning.   Yes [provider]  fluticasone (FLONASE) 50 MCG/ACT nasal spray Place 2 sprays into both nostrils daily as needed  for allergies. 08/14/13  Yes [provider]  furosemide (LASIX) 40 MG tablet Take 1 tablet (40 mg total) by mouth 2 (two) times daily. 01/06/23  Yes End, Cristal Deer, MD  isosorbide mononitrate (IMDUR) 60 MG 24 hr tablet Take 1 tablet (60 mg total) by mouth daily. 01/06/23  Yes End, Cristal Deer, MD  lisinopril (ZESTRIL) 5 MG tablet Take 1 tablet by mouth once daily 01/21/23  Yes Hammock, Sheri, NP  loratadine (CLARITIN) 10 MG tablet Take 10 mg by mouth every evening.   Yes [provider]  metFORMIN (GLUCOPHAGE) 500 MG tablet Take 1 tablet (500 mg total) by mouth every evening. 01/09/23  Yes End, Cristal Deer, MD  metoprolol tartrate (LOPRESSOR) 50 MG tablet Take 1 tablet (50 mg total) by mouth 2 (two) times daily. 01/06/23  Yes End, Cristal Deer, MD  Multiple Vitamin (MULTI VITAMIN DAILY PO) Take 1 tablet by mouth in the morning.   Yes [provider]  nitroGLYCERIN (NITROSTAT) 0.4 MG SL tablet Place 1 tablet (0.4 mg total) under the tongue every 5 (five) minutes as needed. 06/22/18  Yes End, Cristal Deer, MD  Polyethyl Glycol-Propyl Glycol 0.4-0.3 % SOLN Place 1 drop into both eyes 4 (four) times daily as needed (for dry eyes).    Yes [provider]       Review of Systems    Ongoing dyspnea with minimal activity.  Ongoing mild lower extremity edema.  Her legs are tender to touch.  She notes poor exercise tolerance.  1 episode of chest  discomfort that was nitrate responsive.  Symptom free of chronic orthopnea.  She denies palpitations, PND, dizziness, syncope, or early satiety.  All other systems reviewed and are otherwise negative except as noted above.    Physical Exam    VS:  BP 120/60 (BP Location: Right Arm, Patient Position: Sitting, Cuff Size: Large)   Pulse (!) 57   Ht 5\' 5"  (1.651 m)   Wt 253 lb 2 oz (114.8 kg)   SpO2 97%   BMI 42.12 kg/m  , BMI Body mass index is 42.12 kg/m.     GEN: Obese, in no acute distress. HEENT: normal. Neck: Supple, obese, difficult to gauge JVP.  No bruits or masses. Cardiac: RRR, 3/6 systolic murmur loudest at the upper sternal borders but heard throughout.  No rubs or gallops. No clubbing, cyanosis, 1+ bilateral lower extremity edema.  Radials 2+/PT 2+ and equal bilaterally.  Right radial and brachial catheterization sites without bleeding, bruit, or hematoma. Respiratory:  Respirations regular and unlabored, clear to auscultation bilaterally. GI: Obese, soft, nontender, nondistended, BS + x 4. MS: no deformity or atrophy. Skin: warm and dry, no rash. Neuro:  Strength and sensation are intact. Psych: Normal affect.  Accessory Clinical Findings    ECG personally reviewed by me today -    Sinus bradycardia, 57, inferior infarct- no acute changes.  Lab Results  Component Value Date   WBC 5.5 01/03/2023   HGB 12.6 01/06/2023   HCT 37.0 01/06/2023   MCV 90.2 01/03/2023   PLT 164 01/03/2023   Lab Results  Component Value Date   CREATININE 1.10 (H) 01/24/2023   BUN 20 01/24/2023   NA 137 01/24/2023   K 3.5 01/24/2023   CL 101 01/24/2023   CO2 27 01/24/2023   Labs dated October 18, 2022 from Care Everywhere:  Total cholesterol 137, triglycerides 172, HDL 41, LDL 71 Hemoglobin Z6X 6.5  Assessment & Plan    1.  Chronic HFpEF: Recent echo in April  showed normal LV function and moderate aortic stenosis.  She has been having issues with persistent dyspnea on exertion and  recently underwent right and left heart cardiac catheterization, which showed stable cardiac anatomy (see below), elevated right heart pressures, and evidence of severe aortic stenosis.  Lasix was increased to 40 mg twice daily and though she has noted increased urine output, her symptoms persist.  Body habitus makes exam challenging, though she continues to have 1+ bilateral lower extremity edema.  Basic metabolic panel today shows relatively stable renal function.  Heart rate and blood pressure stable.  I will have her increase her Lasix to 80 mg twice daily for the next 3 days and then drop back down to 40 mg twice daily.  Potassium borderline low at 3.5 and I am adding potassium chloride 20 mEq daily.  Secondary to body habitus, not likely to be a good candidate for an SGLT2 inhibitor.  2.  Severe aortic stenosis: Echo in April suggested moderate aortic stenosis with a valve area 1.28 cm.  However, recent diagnostic catheterization notable for mean gradient of 56 mmHg and calculated valve area of 0.7 cm.  In the setting of severe aortic stenosis and ongoing heart failure symptoms, I am referring to structural heart clinic in [place].  Discussed in detail with Ms. Lehner today, was agreeable with this plan.  Continue beta-blocker therapy.  Diuretic adjustment as above.  3.  Coronary artery disease: Status post prior CABG x 4 in 2013.  Recent diagnostic catheterization in the setting of progressive dyspnea and chest discomfort revealed overall stable anatomy with patent vein graft to the diagonal and occlusion of the continuation graft to the obtuse marginal, atretic LIMA to the LAD, and patent vein graft to the RCA.  Native anatomy stable.  Medical therapy recommended.  She had 1 episode of nitrate responsive chest pain while eating.  No recurrence.  Continue aspirin, statin, beta-blocker, nitrate.  4.  Essential hypertension: Blood pressure stable today 120/60.  Continue beta-blocker, nitrate, ACE  inhibitor, and diuretic therapy.  5.  Hyperlipidemia: LDL of 71 in March.  She remains on statin and Zetia therapy.  She is near goal, strongly encourage dietary modifications and regular activity as symptoms allow.  6.  Type 2 diabetes mellitus: Followed closely by primary care with A1c 6.5.  7.  Disposition: Follow-up basic metabolic panel in 2 weeks.  Referral to structural heart clinic in [place].  Follow-up in cardiology clinic in 1 month or sooner if necessary.  Nicolasa Ducking, NP 01/24/2023, 12:42 PM

## 2023-01-24 ENCOUNTER — Other Ambulatory Visit
Admission: RE | Admit: 2023-01-24 | Discharge: 2023-01-24 | Disposition: A | Discharge status: Home / Self Care | Payer: Medicare Other | Source: Ambulatory Visit | Attending: Nurse Practitioner | Admitting: Nurse Practitioner

## 2023-01-24 ENCOUNTER — Other Ambulatory Visit: Payer: Self-pay | Admitting: *Deleted

## 2023-01-24 ENCOUNTER — Ambulatory Visit: Payer: Medicare Other | Attending: Internal Medicine | Admitting: Nurse Practitioner

## 2023-01-24 ENCOUNTER — Encounter: Payer: Self-pay | Admitting: Nurse Practitioner

## 2023-01-24 VITALS — BP 120/60 | HR 57 | Ht 65.0 in | Wt 253.1 lb

## 2023-01-24 DIAGNOSIS — E785 Hyperlipidemia, unspecified: Secondary | ICD-10-CM

## 2023-01-24 DIAGNOSIS — I10 Essential (primary) hypertension: Secondary | ICD-10-CM

## 2023-01-24 DIAGNOSIS — I25118 Atherosclerotic heart disease of native coronary artery with other forms of angina pectoris: Secondary | ICD-10-CM | POA: Diagnosis not present

## 2023-01-24 DIAGNOSIS — Z79899 Other long term (current) drug therapy: Secondary | ICD-10-CM | POA: Insufficient documentation

## 2023-01-24 DIAGNOSIS — I350 Nonrheumatic aortic (valve) stenosis: Secondary | ICD-10-CM | POA: Diagnosis present

## 2023-01-24 DIAGNOSIS — I5032 Chronic diastolic (congestive) heart failure: Secondary | ICD-10-CM

## 2023-01-24 LAB — BASIC METABOLIC PANEL
Anion gap: 9 (ref 5–15)
BUN: 20 mg/dL (ref 8–23)
CO2: 27 mmol/L (ref 22–32)
Calcium: 9.3 mg/dL (ref 8.9–10.3)
Chloride: 101 mmol/L (ref 98–111)
Creatinine, Ser: 1.1 mg/dL — ABNORMAL HIGH (ref 0.44–1.00)
GFR, Estimated: 51 mL/min — ABNORMAL LOW (ref >60)
Glucose, Bld: 102 mg/dL — ABNORMAL HIGH (ref 70–99)
Potassium: 3.5 mmol/L (ref 3.5–5.1)
Sodium: 137 mmol/L (ref 135–145)

## 2023-01-24 MED ORDER — POTASSIUM CHLORIDE ER 20 MEQ PO TBCR: 20.0000 meq | EXTENDED_RELEASE_TABLET | Freq: Every day | ORAL | 3 refills | Status: DC

## 2023-01-24 NOTE — Patient Instructions (Signed)
Medication Instructions:  Your physician recommends that you continue on your current medications as directed. Please refer to the Current Medication list given to you today.   *If you need a refill on your cardiac medications before your next appointment, please call your pharmacy*   Lab Work: Your provider would like for you to have following labs drawn: (BMP).   Please go to the [facility] entrance and check in at the front desk.  You do not need an appointment.  They are open from 7am-6 pm.   If you have labs (blood work) drawn today and your tests are completely normal, you will receive your results only by: MyChart Message (if you have MyChart) OR A paper copy in the mail If you have any lab test that is abnormal or we need to change your treatment, we will call you to review the results.   Testing/Procedures: None ordered today   Follow-Up: At [facility], you and your health needs are our priority.  As part of our continuing mission to provide you with exceptional heart care, we have created designated Provider Care Teams.  These Care Teams include your primary Cardiologist (physician) and Advanced Practice Providers (APPs -  Physician Assistants and Nurse Practitioners) who all work together to provide you with the care you need, when you need it.  We recommend signing up for the patient portal called "MyChart".  Sign up information is provided on this After Visit Summary.  MyChart is used to connect with patients for Virtual Visits (Telemedicine).  Patients are able to view lab/test results, encounter notes, upcoming appointments, etc.  Non-urgent messages can be sent to your provider as well.   To learn more about what you can do with MyChart, go to [email or web address].    Your next appointment:   1 month(s)  Provider:   You may see Yvonne Kendall, MD or one of the following Advanced Practice Providers on your designated Care Team:   Nicolasa Ducking, NP

## 2023-01-29 ENCOUNTER — Telehealth: Payer: Self-pay | Admitting: Internal Medicine

## 2023-01-29 NOTE — Telephone Encounter (Signed)
Reviewed upcoming Structural Heart MD schedules and 7/10 is the first availability.  I spoke with Carlean Jews PA-C and she can see the patient this Friday, 6/28 to begin TAVR discussion and arrange additional testing.  Appointment scheduled and patient aware.

## 2023-01-29 NOTE — Telephone Encounter (Signed)
Pt called reporting since increasing lasix to 80 mg BID x 3 days, her leg swelling has gotten better. However, she stated she feels her SOB is getting worse and feels more fatigued. Pt also stated she is having to take multiple breaks with any exertion   Pt is requesting to move up appointment with Dr. Clifton James.  Will forward to scheduler but also forward to NP for further recommendations.

## 2023-01-29 NOTE — Telephone Encounter (Signed)
Pt calling in regards to getting a sooner appointment because she is not feeling well. She states she is feeling really tired and having shortness of breathe as well. Please advise.

## 2023-01-30 ENCOUNTER — Other Ambulatory Visit (HOSPITAL_COMMUNITY): Payer: Self-pay | Admitting: Emergency Medicine

## 2023-01-30 ENCOUNTER — Other Ambulatory Visit: Payer: Self-pay | Admitting: Physician Assistant

## 2023-01-30 DIAGNOSIS — I350 Nonrheumatic aortic (valve) stenosis: Secondary | ICD-10-CM

## 2023-01-30 NOTE — Progress Notes (Addendum)
[facility]   MULTIDISCIPLINARY HEART VALVE CLINIC                                     Cardiology Office Note:    Date:  01/31/2023   ID:  Jamie [name], DOB November 01, 1945, MRN [id number]  PCP:  Raynelle Jan., MD  [facility] Cardiologist:  Yvonne Kendall, MD  [facility] Electrophysiologist:  None   Referring MD: Raynelle Jan., MD   CC: worsening shortness of breath.   History of Present Illness:    Jamie [name] is a 77 y.o. female with a hx of CAD s/p CABGx4V (2013 by Dr. Laneta Simmers), HLD, DMT2, morbid obesity (BMI 42), OSA, COPD, carotid artery disease, breast CA s/p bilateral mastectomies, chronic diastolic CHF, and severe aortic stenosis who was added onto my schedule for evaluation of worsening shortness of breath.  She previously suffered an NSTEMI in 2013 with diagnostic catheterization revealing severe multivessel disease, she underwent CABG x4. Stress testing was performed in May 2016 with finding of a small area of inferolateral ischemia. She was medically managed. She underwent repeat catheterization in 2019 in the setting of dyspnea on exertion and this revealed an atretic LIMA to the LAD, patent vein graft to the diagonal with an occluded jump portion of the graft to the OM 2, and a patent vein graft to the RCA. She had a 70% ostial left main and 50% proximal LAD stenosis. FFR was measured through these stenoses, and was normal at 0.82. She was placed on isosorbide therapy. She has also been followed over time for known aortic stenosis. Most recent echo 11/27/22 showed EF 55-60%, G2DD, and moderate AS with a mean gradient of 24 mm hg, peak gradient 45.7 mm hg, AVA 1.28cm2, DVI 0.5, SVI 45.   Seen in the office in May for worsening DOE, LE edema and arm discomfort. Initially PET stress was planned but converted to a heart cath given accelerating symptoms. [facility] 01/06/23 showed stable, multivessel native coronary artery disease with patent vein graft to the distal  RCA, patent vein graft to the D1 with known occlusion of the continuation to the OM 2, and atretic LIMA to the LAD. Overall, coronary anatomy felt to be stable. Filling pressures were elevated with a wedge of 20 and PA 43/20 with normal CO/CI. Additionally, she was also noted to have normal LV function and severe AS with a mean gradient of 56 mmHg and calculated valve area of 0.7 cm. In that setting, lasix increased to 40mg  BID. Metoprolol increased and nitrates reduced.    Seen back in the office by Ward Givens NP on 01/24/23 with worsening symptoms of SOB and LE edema despite increased urine output. Also complained on one episode of nitrate responsive chest pain. Lasix increased to 80mg  BID x3 days then dropped back down to 40mg  BID. She was referred to structural heart for consideration of TAVR. Appointment made with Dr. Clifton James 02/12/23. However, she called into our office complaining of worsening symptoms and asked to be sooner. She was added onto my schedule for evaluation.   Today she is here with her close family friend, [name]. She continues to complain of life limiting dyspnea despite diuresis. Cannot do anything without having to stop multiple times to catch her breath. She had a good response to the three days of increased lasix and down 4 lbs and improved LE edema. Her  abdomen still feels tight and she complains of stomach pain and diarrhea which was worse on the 80mg  dose of lasix. Complains of leg pain. She chronically sleeps in a recliner. She had not anymore recent episodes of chest pain. Occasional dizziness but no syncope. Of note, she has not seen a dentist in several years and will make an apt.    Past Medical History:  Diagnosis Date   Anxiety    Aortic stenosis    a. 12/2014 Echo:  EF 60-65%, no RWMA, Gr 1 DD, Ao sclerosis, mild AS, mean 14 mmHg, MAC; b. 10/2020 Echo: EF 60-65%, no rwma, GrII DD, nl RV fxn, mildly dil LA, Mild-mod AS; c. 11/2022 Echo: EF 55-60%, mod AS (AVA 1.28cm^2);  d. Cath: Mean gradient , AVA 0.7 cm^2.   Asthma    Breast cancer (HCC) 08/2011   lt. breast ca   CAD (coronary artery disease)    a. 12/2011 NSTEMI/Cath: Sev 3VD; b. 12/2011 CABG x4: LIMA-LAD, SVG-RCA, SVG-D1-->OM2; c. 08/2017 Cath: Sev native dzs, LIMA->LAD atretic, VG->D1 ok w/ 100 @ continuation to OM2, VG->dRCA ok-->Med rx; c. 01/2023 Cath: LM 70ost, LAD 50p, LCX nl, RCA 142m, VG->dRCA nl, VG->D1 ok w/ 100% continuation to OM2, LIMA->LAD atretic-->overall unchanged->Med Rx.   Carotid artery disease (HCC)    a.  Pre-CABG Dopplers 5/13: Bilateral 40-59%; b.  Carotid US 6/16: RICA 40-59%, LICA 60-79%, normal subclavians bilaterally;  c. Carotid US 11/16: RICA 40-59%; LICA 40-59%, normal subclavians; d. Carotid US 11/17:  Stable 40-59% bilateral ICA stenoses; e. 07/2017 Carotid U/S: 1-39% bilat ICA stenoses.   Chronic heart failure with preserved ejection fraction (HFpEF) (HCC)    a. 10/2020 Echo: EF 60-65%, GrII DD; b. 11/2022 Echo: EF 55-60%, no rwma, GrII DD.   COPD (chronic obstructive pulmonary disease) (HCC)    DM2 (diabetes mellitus, type 2) (HCC)    metformin   Gallstones    Symptomatic   HLD (hyperlipidemia)    Hx of cardiovascular stress test    Myoview 5/16:  small area of inf-lat ischemia, EF 70%; Low Risk >> try medical Rx   Hx of migraines    Hypertension    Myocardial infarction (HCC)    Obesity    Shortness of breath    Sleep apnea    sleep study2-3 yrs ago lost weight afterward but gained back   UTI (lower urinary tract infection)    freq-bladder implant -removed    Past Surgical History:  Procedure Laterality Date   ABDOMINAL HYSTERECTOMY  1977   APPENDECTOMY     AXILLARY LYMPH NODE DISSECTION  10/18/2011   Procedure: AXILLARY LYMPH NODE DISSECTION;  Surgeon: Robyne Askew, MD;  Location: [facility] OR;  Service: General;  Laterality: Left;   BREAST RECONSTRUCTION  10/18/2011   Procedure: BREAST RECONSTRUCTION;  Surgeon: Wayland Denis, DO;  Location: [facility] OR;  Service:  Plastics;  Laterality: Bilateral;   bilateral breast reconstruction with bilateral tissue expander and placement of flex hd.   CARDIAC CATHETERIZATION     CESAREAN SECTION  04/1976   CHOLECYSTECTOMY N/A 08/17/2018   Procedure: LAPAROSCOPIC CHOLECYSTECTOMY;  Surgeon: Abigail Miyamoto, MD;  Location: [facility] OR;  Service: General;  Laterality: N/A;   COLONOSCOPY WITH PROPOFOL N/A 06/20/2019   Procedure: COLONOSCOPY WITH PROPOFOL;  Surgeon: Jeani Hawking, MD;  Location: [facility] ENDOSCOPY;  Service: Endoscopy;  Laterality: N/A;   CORONARY ARTERY BYPASS GRAFT  12/13/2011   Procedure: CORONARY ARTERY BYPASS GRAFTING (CABG);  Surgeon: Alleen Borne, MD;  Location: [facility]  OR;  Service: Open Heart Surgery;  Laterality: N/A;  Times four using endoscopically harvested left greater saphenous vein and left internal mammary artery. Right greater saphenous vein attempted; not appropriate for vein harvest.   CORONARY PRESSURE/FFR STUDY N/A 08/22/2017   Procedure: INTRAVASCULAR PRESSURE WIRE/FFR STUDY;  Surgeon: Yvonne Kendall, MD;  Location: [facility] INVASIVE CV LAB;  Service: Cardiovascular;  Laterality: N/A;   EYE SURGERY     bilateral cataract extraction    LAPAROSCOPIC GASTRIC BANDING  2010   LEFT HEART CATHETERIZATION WITH CORONARY ANGIOGRAM N/A 12/11/2011   Procedure: LEFT HEART CATHETERIZATION WITH CORONARY ANGIOGRAM;  Surgeon: Laurey Morale, MD;  Location: [facility] CATH LAB;  Service: Cardiovascular;  Laterality: N/A;   LESION REMOVAL  04/09/2012   Procedure: LESION REMOVAL;  Surgeon: Wayland Denis, DO;  Location: [facility];  Service: Plastics;  Laterality: Bilateral;   MASTECTOMY W/ SENTINEL NODE BIOPSY  10/18/2011   Procedure: MASTECTOMY WITH SENTINEL LYMPH NODE BIOPSY;  Surgeon: Robyne Askew, MD;  Location: [facility] OR;  Service: General;  Laterality: Bilateral;  bilateral mastectomy and left sentinel node biopsy   NASAL SINUS SURGERY     POLYPECTOMY  06/20/2019   Procedure: POLYPECTOMY;  Surgeon: Jeani Hawking,  MD;  Location: [facility] ENDOSCOPY;  Service: Endoscopy;;   PORT-A-CATH REMOVAL  04/09/2012   Procedure: REMOVAL PORT-A-CATH;  Surgeon: Robyne Askew, MD;  Location:  SURGERY CENTER;  Service: General;  Laterality: Right;   PORTACATH PLACEMENT  12/02/2011   Procedure: INSERTION PORT-A-CATH;  Surgeon: Robyne Askew, MD;  Location: [facility] OR;  Service: General;  Laterality: Right;   RIGHT/LEFT HEART CATH AND CORONARY/GRAFT ANGIOGRAPHY N/A 08/22/2017   Procedure: RIGHT/LEFT HEART CATH AND CORONARY/GRAFT ANGIOGRAPHY;  Surgeon: Yvonne Kendall, MD;  Location: [facility] INVASIVE CV LAB;  Service: Cardiovascular;  Laterality: N/A;   RIGHT/LEFT HEART CATH AND CORONARY/GRAFT ANGIOGRAPHY N/A 01/06/2023   Procedure: RIGHT/LEFT HEART CATH AND CORONARY/GRAFT ANGIOGRAPHY;  Surgeon: Yvonne Kendall, MD;  Location: [facility] INVASIVE CV LAB;  Service: Cardiovascular;  Laterality: N/A;   TONSILLECTOMY     US ECHOCARDIOGRAPHY     at [facility]   VIDEO BRONCHOSCOPY WITH ENDOBRONCHIAL NAVIGATION Left 11/26/2013   Procedure: VIDEO BRONCHOSCOPY WITH ENDOBRONCHIAL NAVIGATION;  Surgeon: Leslye Peer, MD;  Location: [facility] OR;  Service: Thoracic;  Laterality: Left;    Current Medications: Current Meds  Medication Sig   albuterol (PROVENTIL) (2.5 MG/3ML) 0.083% nebulizer solution Inhale 3 mLs into the lungs every 6 (six) hours as needed for wheezing or shortness of breath.   albuterol (VENTOLIN HFA) 108 (90 Base) MCG/ACT inhaler Inhale 1-2 puffs into the lungs every 6 (six) hours as needed for wheezing or shortness of breath.   aspirin EC 81 MG tablet Take 1 tablet (81 mg total) by mouth daily. (Patient taking differently: Take 81 mg by mouth every evening.)   atorvastatin (LIPITOR) 80 MG tablet TAKE 1 TABLET BY MOUTH ONCE DAILY AT 6 PM   cephALEXin (KEFLEX) 500 MG capsule Take 500 mg by mouth in the morning.   Cholecalciferol (VITAMIN D3) 1000 units CAPS Take 1,000 Units by mouth in the morning.   ezetimibe (ZETIA) 10 MG tablet  Take 10 mg by mouth in the morning.   fluticasone (FLONASE) 50 MCG/ACT nasal spray Place 2 sprays into both nostrils daily as needed for allergies.   furosemide (LASIX) 40 MG tablet Take 1 tablet (40 mg total) by mouth 2 (two) times daily.   isosorbide mononitrate (IMDUR) 60 MG 24  hr tablet Take 1 tablet (60 mg total) by mouth daily.   lisinopril (ZESTRIL) 5 MG tablet Take 1 tablet by mouth once daily   loratadine (CLARITIN) 10 MG tablet Take 10 mg by mouth every evening.   metFORMIN (GLUCOPHAGE) 500 MG tablet Take 1 tablet (500 mg total) by mouth every evening.   metoprolol tartrate (LOPRESSOR) 50 MG tablet Take 1 tablet (50 mg total) by mouth 2 (two) times daily.   Multiple Vitamin (MULTI VITAMIN DAILY PO) Take 1 tablet by mouth in the morning.   nitroGLYCERIN (NITROSTAT) 0.4 MG SL tablet Place 1 tablet (0.4 mg total) under the tongue every 5 (five) minutes as needed.   Polyethyl Glycol-Propyl Glycol 0.4-0.3 % SOLN Place 1 drop into both eyes 4 (four) times daily as needed (for dry eyes).    potassium chloride 20 MEQ TBCR Take 1 tablet (20 mEq total) by mouth daily.     Allergies:   Penicillins, Vicodin [hydrocodone-acetaminophen], Duloxetine hcl, Sulfa antibiotics, and Sulfamethoxazole   Social History   Socioeconomic History   Marital status: Married    Spouse name: Not on file   Number of children: 6   Years of education: Not on file   Highest education level: Not on file  Occupational History   Occupation: retired  Tobacco Use   Smoking status: Former    Packs/day: 2.00    Years: 40.00    Additional pack years: 0.00    Total pack years: 80.00    Types: Cigarettes    Quit date: 08/25/2006    Years since quitting: 16.4   Smokeless tobacco: Never  Vaping Use   Vaping Use: Never used  Substance and Sexual Activity   Alcohol use: Yes    Comment: 1-2 times a year   Drug use: No   Sexual activity: Not Currently  Other Topics Concern   Not on file  Social History Narrative    Not on file   Social Determinants of Health   Financial Resource Strain: Not on file  Food Insecurity: Not on file  Transportation Needs: Not on file  Physical Activity: Not on file  Stress: Not on file  Social Connections: Not on file     Family History: The patient's family history includes Breast cancer in her maternal aunt and maternal aunt; Heart disease in her father and mother; Ovarian cancer in her paternal aunt. There is no history of Anesthesia problems, Hypotension, Malignant hyperthermia, or Pseudochol deficiency.  ROS:   Please see the history of present illness.    All other systems reviewed and are negative.  EKGs/Labs/Other Studies Reviewed:    Cardiac Studies & Procedures   CARDIAC CATHETERIZATION  CARDIAC CATHETERIZATION 01/06/2023  Narrative Conclusions: Severe ostial LMCA disease and chronic total occlusion of mid RCA.  There is also moderate disease of mid LAD.  Overall appearance is similar to prior catheterization in 2019. Widely patent SVG-D1 with chronic total occlusion of sequential portion to OM. Widely patent SVG-RCA. Small LIMA-LAD that has partial retrograde filling from the LAD (known to be atretic by prior cath). Mildly elevated left and right heart filling pressures. Normal Fick cardiac output/index. Severe aortic valve stenosis (cannot exclude some element of subvalvular/LVOT gradient).  Recommendations: Increase diuresis. Increase metoprolol and reduce isosorbide mononitrate. Aggressive secondary prevention of coronary artery disease. Consider referral to Dr. McAlhany/structural heart team for further evaluation/management of aortic stenosis if symptoms do not improve at follow-up.  Yvonne Kendall, MD [facility]  Findings Coronary Findings Diagnostic  Dominance:  Right  Left Main Vessel is large. Ost LM lesion is 70% stenosed.  Left Anterior Descending Vessel is large. Prox LAD lesion is 50% stenosed.  First Diagonal  Branch Vessel is moderate in size.  Second Diagonal Branch Vessel is small in size.  Left Circumflex Vessel is moderate in size. Vessel is angiographically normal.  First Obtuse Marginal Branch Vessel is small in size.  Second Obtuse Marginal Branch Vessel is moderate in size.  Right Coronary Artery Vessel is moderate in size. Mid RCA lesion is 100% stenosed. The lesion is chronically occluded.  Right Posterior Descending Artery Vessel is moderate in size. Vessel is angiographically normal.  Right Posterior Atrioventricular Artery Vessel is large in size. Vessel is angiographically normal.  Saphenous Graft To Dist RCA SVG and is large.  The graft exhibits no disease.  Sequential Saphenous Graft To 1st Diag, 2nd Mrg SVG and is large. Origin to Insertion lesion between 1st Diag and 2nd Mrg  is 100% stenosed. The lesion is chronically occluded.  LIMA LIMA Graft To Mid LAD LIMA and is small.  The graft is atretic.  There is competitive flow.  Intervention  No interventions have been documented.   CARDIAC CATHETERIZATION  CARDIAC CATHETERIZATION 08/22/2017  Narrative Conclusions: 1. Severe native coronary artery disease, including 70% ostial LMCA, 50% proximal LAD, and 100% mid RCA lesions. 2. Atretic LIMA->LAD. 3. Patent SVG->D1; jump portion to OM2 appears chronically occluded. 4. Widely patent SVG->RCA. 5. FFR of ostial LMCA and proximal LAD lesions is not significant (FFR 0.82), though this may be influenced by some retrograde from via the SVG->D. 6. Mildly elevated left heart, right heart, and pulmonary artery pressures. 7. Normal Fick cardiac output/index. 8. Mild aortic stenosis.  Recommendations: 1. Aggressive medical therapy. I will start isosorbide mononitrate 30 mg daily, which should be escalated as tolerated. If the patient is intolerant of isosorbide mononitrate of has incomplete relief of symptoms, addition of ranolazine and pulmonary consultation  should be considered. 2. If marked dyspnea persists despite maximal antianginal therapy, adequate diuresis, weight loss, and pulmonary evaluation, PCI to the LMCA could be considered. 3. Continue secondary prevention, including high-intensity statin therapy and diabetes control.  Yvonne Kendall, MD [facility] Pager: [phone]  Findings Coronary Findings Diagnostic  Dominance: Right  Left Main Vessel was injected. Vessel is large. Ost LM lesion is 70% stenosed. Pressure wire/FFR was performed on the lesion. FFR: 0.82.  Left Anterior Descending Vessel was injected. Vessel is large. Prox LAD lesion is 50% stenosed. Pressure wire/FFR was performed on the lesion. FFR: 0.82.  First Diagonal Branch Vessel is moderate in size.  Second Diagonal Branch Vessel is small in size.  Left Circumflex Vessel was injected. Vessel is moderate in size. Vessel is angiographically normal.  First Obtuse Marginal Branch Vessel is small in size.  Second Obtuse Marginal Branch Vessel is moderate in size.  Right Coronary Artery Vessel was injected. Vessel is moderate in size. Mid RCA lesion is 100% stenosed. The lesion is chronically occluded.  Right Posterior Descending Artery Vessel is moderate in size. Vessel is angiographically normal.  Right Posterior Atrioventricular Artery Vessel is large in size. Vessel is angiographically normal.  Saphenous Graft To Dist RCA SVG graft was visualized by angiography and is large. The graft exhibits no disease.  Sequential Saphenous Graft To 1st Diag, 2nd Mrg SVG graft was visualized by angiography and is large. Origin to Insertion lesion between 1st Diag and 2nd Mrg  is 100% stenosed. The lesion is chronically occluded.  LIMA LIMA Graft To Mid LAD LIMA graft was visualized by angiography. The graft is atretic.  There is competitive flow.  Intervention  No interventions have been documented.   STRESS TESTS  MYOCARDIAL PERFUSION  IMAGING 12/14/2014  Narrative  Small in size, mild in severity perfusion defect in the basal inferolateral wall is mostly reversible and may represent ischemia in the left circumflex artery territory.  Low risk pharmacological stress nuclear study with a small area of mild basal inferolateral ischemia and normal left ventricular regional and global systolic function.   ECHOCARDIOGRAM  ECHOCARDIOGRAM COMPLETE 11/27/2022  Narrative ECHOCARDIOGRAM REPORT    Patient Name:   HALEEMA IODICE Date of Exam: 11/27/2022 Medical Rec #:  [id number]     Height:       65.0 in Accession #:    [id number]    Weight:       255.0 lb Date of Birth:  1945-12-12     BSA:          2.193 m Patient Age:    77 years      BP:           154/72 mmHg Patient Gender: F             HR:           62 bpm. Exam Location:  [place]  Procedure: 2D Echo and Intracardiac Opacification Agent  Indications:    I35.0 Nonrheumatic aortic (valve) stenosis  History:        Patient has prior history of Echocardiogram examinations, most recent 10/23/2021. CHF, CAD and Previous Myocardial Infarction, Prior CABG, COPD, Aortic Valve Disease, Signs/Symptoms:Dyspnea and Fatigue; Risk Factors:Sleep Apnea, Former Smoker, Hypertension and Diabetes.  Sonographer:    Quentin Ore RDMS, RVT, RDCS Referring Phys: [id number] CHRISTOPHER RONALD BERGE  IMPRESSIONS   1. Left ventricular ejection fraction, by estimation, is 55 to 60%. The left ventricle has normal function. The left ventricle has no regional wall motion abnormalities. Left ventricular diastolic parameters are consistent with Grade II diastolic dysfunction (pseudonormalization). 2. Right ventricular systolic function is normal. The right ventricular size is normal. 3. Left atrial size was mildly dilated. 4. The mitral valve is normal in structure. No evidence of mitral valve regurgitation. 5. The aortic valve was not well visualized. Aortic valve regurgitation is not visualized.  Moderate aortic valve stenosis. Aortic valve area, by VTI measures 1.28 cm. Aortic valve mean gradient measures 24.0 mmHg. Aortic valve Vmax measures 3.38 m/s. 6. The inferior vena cava is normal in size with greater than 50% respiratory variability, suggesting right atrial pressure of 3 mmHg.  Comparison(s): Previous AV meas reported as 21max/20mean PG.  FINDINGS Left Ventricle: Left ventricular ejection fraction, by estimation, is 55 to 60%. The left ventricle has normal function. The left ventricle has no regional wall motion abnormalities. Definity contrast agent was given IV to delineate the left ventricular endocardial borders. The left ventricular internal cavity size was normal in size. There is no left ventricular hypertrophy. Left ventricular diastolic parameters are consistent with Grade II diastolic dysfunction (pseudonormalization).  Right Ventricle: The right ventricular size is normal. No increase in right ventricular wall thickness. Right ventricular systolic function is normal.  Left Atrium: Left atrial size was mildly dilated.  Right Atrium: Right atrial size was not well visualized.  Pericardium: There is no evidence of pericardial effusion.  Mitral Valve: The mitral valve is normal in structure. No evidence of mitral valve regurgitation.  Tricuspid Valve: The tricuspid valve is normal in  structure. Tricuspid valve regurgitation is not demonstrated.  Aortic Valve: The aortic valve was not well visualized. Aortic valve regurgitation is not visualized. Moderate aortic stenosis is present. Aortic valve mean gradient measures 24.0 mmHg. Aortic valve peak gradient measures 45.7 mmHg. Aortic valve area, by VTI measures 1.28 cm.  Pulmonic Valve: The pulmonic valve was not well visualized. Pulmonic valve regurgitation is not visualized.  Aorta: The aortic root is normal in size and structure.  Venous: The inferior vena cava is normal in size with greater than 50% respiratory  variability, suggesting right atrial pressure of 3 mmHg.  IAS/Shunts: No atrial level shunt detected by color flow Doppler.   LEFT VENTRICLE PLAX 2D LVIDd:         5.10 cm   Diastology LVIDs:         3.80 cm   LV e' medial:    5.22 cm/s LV PW:         0.90 cm   LV E/e' medial:  36.0 LV IVS:        0.90 cm   LV e' lateral:   6.64 cm/s LVOT diam:     1.80 cm   LV E/e' lateral: 28.3 LV SV:         99 LV SV Index:   45 LVOT Area:     2.54 cm   RIGHT VENTRICLE            IVC RV S prime:     9.14 cm/s  IVC diam: 1.60 cm TAPSE (M-mode): 3.1 cm  LEFT ATRIUM           Index LA diam:      4.40 cm 2.01 cm/m LA Vol (A4C): 83.9 ml 38.25 ml/m AORTIC VALVE                     PULMONIC VALVE AV Area (Vmax):    1.10 cm      PV Vmax:       1.52 m/s AV Area (Vmean):   1.14 cm      PV Peak grad:  9.3 mmHg AV Area (VTI):     1.28 cm AV Vmax:           338.00 cm/s AV Vmean:          234.000 cm/s AV VTI:            0.772 m AV Peak Grad:      45.7 mmHg AV Mean Grad:      24.0 mmHg LVOT Vmax:         146.00 cm/s LVOT Vmean:        105.000 cm/s LVOT VTI:          0.389 m LVOT/AV VTI ratio: 0.50  AORTA Ao Root diam: 3.20 cm Ao Asc diam:  2.50 cm  MITRAL VALVE MV Area (PHT): 2.39 cm     SHUNTS MV Decel Time: 317 msec     Systemic VTI:  0.39 m MV E velocity: 188.00 cm/s  Systemic Diam: 1.80 cm MV A velocity: 103.00 cm/s MV E/A ratio:  1.83  Debbe Odea MD Electronically signed by Debbe Odea MD Signature Date/Time: 11/27/2022/2:51:07 PM    Final             EKG:  EKG is NOT ordered today.    Recent Labs: 01/03/2023: Platelets 164 01/06/2023: Hemoglobin 12.6 01/24/2023: BUN 20; Creatinine, Ser 1.10; Potassium 3.5; Sodium 137  Recent Lipid Panel    Component Value  Date/Time   CHOL 131 03/30/2019 1145   TRIG 192 (H) 03/30/2019 1145   HDL 42 03/30/2019 1145   CHOLHDL 3.1 03/30/2019 1145   CHOLHDL 3 03/10/2012 1015   VLDL 36.4 03/10/2012 1015   LDLCALC 51  03/30/2019 1145     Risk Assessment/Calculations:     STS Surgical Risk Assessment:   Procedure Type: Isolated AVR PERIOPERATIVE OUTCOME ESTIMATE % Operative Mortality 5.15% Morbidity & Mortality 15.4% Stroke 1.87% Renal Failure 2.62% Reoperation 3.3% Prolonged Ventilation 11% Deep Sternal Wound Infection 0.251% Long Hospital Stay (>14 days) 8.48% Short Hospital Stay (<6 days)* 26.1%   Physical Exam:    VS:  BP 126/64   Pulse 62   Ht 5\' 4"  (1.626 m)   Wt 249 lb 3.2 oz (113 kg)   SpO2 96%   BMI 42.78 kg/m     Wt Readings from Last 3 Encounters:  01/31/23 249 lb 3.2 oz (113 kg)  01/24/23 253 lb 2 oz (114.8 kg)  01/06/23 250 lb (113.4 kg)     GEN: obese.  HEENT: Normal NECK: No JVD LYMPHATICS: No lymphadenopathy CARDIAC: RRR, 3/6 SEM heard best at RUSB. No rubs, gallops RESPIRATORY:  Clear to auscultation without rales, wheezing or rhonchi  ABDOMEN: Soft, non-tender, but slightly distended MUSCULOSKELETAL:  Trace pretibial edema in left leg. No deformity  SKIN: Warm and dry NEUROLOGIC:  Alert and oriented x 3 PSYCHIATRIC:  Normal affect   ASSESSMENT:    1. Chronic heart failure with preserved ejection fraction (HFpEF) (HCC)   2. Severe aortic stenosis   3. 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 Essential hypertension   5. Hyperlipidemia LDL goal <70   6. Morbid obesity (HCC)   7. Poor dentition    PLAN:    In order of problems listed above:  Chronic HFpEF: having ongoing issues with persistent lifestyle limiting dyspnea on exertion with NYHA class III symptoms. Recently diuresed with escalating Lasix dosing with good response (down 4lbs and LE edema improvement). I think she looks good from a volume standpoint and will keep Lasix at current dosing. Secondary to body habitus, not likely to be a good candidate for an SGLT2 inhibitor. Check BMET today.    Severe aortic stenosis:  echo in April showed moderate aortic stenosis with a valve area 1.28 cm but recent cath showed severe AS with a mean gradient of 56 mmHg and AVA 0.7 cm. I have gotten her set up for a gated cardiac CT with CTA chest/abdomen/pelvis on 02/04/23 and an apt with Dr. Laneta Simmers on 7/11 as part of a multidisciplinary approach to her care. I think she would be best suited for TAVR given age, previous sternotomy, and morbid obesity.     CAD s/p CABG x4V: recent cath revealed overall stable anatomy. Medical therapy recommended. Continue aspirin, statin, beta-blocker, nitrate. Had one semi recent episode of nitrate responsive chest pain, but no recurrence.    HTN: Blood pressure stable today. Continue beta-blocker, nitrate, ACE inhibitor, and diuretic therapy.   HLD: LDL of 71 in March. Continue statin/Zetia .    Morbid obesity: Body mass index is 42.78 kg/m.  This is certainly contributing to SOB. May be a good GLP1 agonist candidate at some point.  Dental care: she will make an apt with a dentist   Medication Adjustments/Labs and Tests Ordered: Current medicines are reviewed at length with the patient today.  Concerns regarding medicines are outlined above.  Orders Placed This Encounter  Procedures   Basic metabolic panel   No orders of the defined types were placed in this encounter.   Patient Instructions  Medication Instructions:  Your physician recommends that you continue on your current medications as directed. Please refer to the Current Medication list given to you today.  *If you need a refill on your cardiac medications before your next appointment, please call your pharmacy*   Lab Work: TODAY: BMET If you have labs (blood work) drawn today and your tests are completely normal, you will receive your results only by: MyChart Message (if you have MyChart) OR A paper copy in the mail If you have any lab test that is abnormal or we need to change your treatment, we will call you to review  the results.   Testing/Procedures: SEE INSTRUCTION LETTER   Follow-Up: At [facility], you and your health needs are our priority.  As part of our continuing mission to provide you with exceptional heart care, we have created designated Provider Care Teams.  These Care Teams include your primary Cardiologist (physician) and Advanced Practice Providers (APPs -  Physician Assistants and Nurse Practitioners) who all work together to provide you with the care you need, when you need it.  We recommend signing up for the patient portal called "MyChart".  Sign up information is provided on this After Visit Summary.  MyChart is used to connect with patients for Virtual Visits (Telemedicine).  Patients are able to view lab/test results, encounter notes, upcoming appointments, etc.  Non-urgent messages can be sent to your provider as well.   To learn more about what you can do with MyChart, go to [email or web address].    Your next appointment:   KEEP SCHEDULE FOLLOW-UP   Signed, Cline Crock, PA-C  01/31/2023 1:18 PM    [facility]

## 2023-01-31 ENCOUNTER — Ambulatory Visit: Payer: Medicare Other | Attending: Cardiovascular Disease | Admitting: Physician Assistant

## 2023-01-31 ENCOUNTER — Encounter: Payer: Self-pay | Admitting: Physician Assistant

## 2023-01-31 VITALS — BP 126/64 | HR 62 | Ht 64.0 in | Wt 249.2 lb

## 2023-01-31 DIAGNOSIS — K089 Disorder of teeth and supporting structures, unspecified: Secondary | ICD-10-CM | POA: Insufficient documentation

## 2023-01-31 DIAGNOSIS — I5032 Chronic diastolic (congestive) heart failure: Secondary | ICD-10-CM | POA: Diagnosis not present

## 2023-01-31 DIAGNOSIS — I10 Essential (primary) hypertension: Secondary | ICD-10-CM | POA: Diagnosis not present

## 2023-01-31 DIAGNOSIS — I25118 Atherosclerotic heart disease of native coronary artery with other forms of angina pectoris: Secondary | ICD-10-CM | POA: Diagnosis not present

## 2023-01-31 DIAGNOSIS — Z9189 Other specified personal risk factors, not elsewhere classified: Secondary | ICD-10-CM

## 2023-01-31 DIAGNOSIS — I350 Nonrheumatic aortic (valve) stenosis: Secondary | ICD-10-CM | POA: Insufficient documentation

## 2023-01-31 DIAGNOSIS — E785 Hyperlipidemia, unspecified: Secondary | ICD-10-CM | POA: Insufficient documentation

## 2023-01-31 DIAGNOSIS — I5031 Acute diastolic (congestive) heart failure: Secondary | ICD-10-CM

## 2023-01-31 NOTE — Patient Instructions (Signed)
Medication Instructions:  Your physician recommends that you continue on your current medications as directed. Please refer to the Current Medication list given to you today.  *If you need a refill on your cardiac medications before your next appointment, please call your pharmacy*   Lab Work: TODAY: BMET If you have labs (blood work) drawn today and your tests are completely normal, you will receive your results only by: MyChart Message (if you have MyChart) OR A paper copy in the mail If you have any lab test that is abnormal or we need to change your treatment, we will call you to review the results.   Testing/Procedures: SEE INSTRUCTION LETTER   Follow-Up: At [facility], you and your health needs are our priority.  As part of our continuing mission to provide you with exceptional heart care, we have created designated Provider Care Teams.  These Care Teams include your primary Cardiologist (physician) and Advanced Practice Providers (APPs -  Physician Assistants and Nurse Practitioners) who all work together to provide you with the care you need, when you need it.  We recommend signing up for the patient portal called "MyChart".  Sign up information is provided on this After Visit Summary.  MyChart is used to connect with patients for Virtual Visits (Telemedicine).  Patients are able to view lab/test results, encounter notes, upcoming appointments, etc.  Non-urgent messages can be sent to your provider as well.   To learn more about what you can do with MyChart, go to [email or web address].    Your next appointment:   KEEP SCHEDULE FOLLOW-UP

## 2023-02-01 LAB — BASIC METABOLIC PANEL
BUN/Creatinine Ratio: 18 (ref 12–28)
BUN: 24 mg/dL (ref 8–27)
CO2: 25 mmol/L (ref 20–29)
Calcium: 9.5 mg/dL (ref 8.7–10.3)
Chloride: 102 mmol/L (ref 96–106)
Creatinine, Ser: 1.35 mg/dL — ABNORMAL HIGH (ref 0.57–1.00)
Glucose: 196 mg/dL — ABNORMAL HIGH (ref 70–99)
Potassium: 4.2 mmol/L (ref 3.5–5.2)
Sodium: 142 mmol/L (ref 134–144)
eGFR: 40 mL/min/{1.73_m2} — ABNORMAL LOW (ref >59)

## 2023-02-03 ENCOUNTER — Telehealth: Payer: Self-pay | Admitting: Internal Medicine

## 2023-02-03 NOTE — Telephone Encounter (Signed)
Patient requesting referral to be placed to the dentist. Patient states she spoke with you about this and wanted to follow up because she's looking to have the dental appointment tomorrow if possible.

## 2023-02-03 NOTE — Telephone Encounter (Signed)
Patient notified of Cline Crock message. Patient verbalized understanding and has no further questions at this time.

## 2023-02-03 NOTE — Telephone Encounter (Signed)
Pt would like a callback regarding suggested dental appt. Please advise

## 2023-02-04 ENCOUNTER — Ambulatory Visit (HOSPITAL_COMMUNITY)
Admission: RE | Admit: 2023-02-04 | Discharge: 2023-02-04 | Disposition: A | Discharge status: Home / Self Care | Payer: Medicare Other | Source: Ambulatory Visit | Attending: Physician Assistant | Admitting: Physician Assistant

## 2023-02-04 DIAGNOSIS — I350 Nonrheumatic aortic (valve) stenosis: Secondary | ICD-10-CM | POA: Diagnosis present

## 2023-02-04 MED ORDER — IOHEXOL 350 MG/ML SOLN
100.0000 mL | Freq: Once | INTRAVENOUS | Status: AC | PRN
  Administered 2023-02-04: 100 mL via INTRAVENOUS

## 2023-02-05 ENCOUNTER — Telehealth: Payer: Self-pay | Admitting: Cardiology

## 2023-02-05 NOTE — Telephone Encounter (Signed)
Patient cancelled consultation appointment with Dr. Clifton James for 7/10. She is scheduled to see Dr. Laneta Simmers 7/11. Called to clarify this cancellation and dental referral/extraction timeline. May need to adjust TAVR timing. Left VM to return call.   Georgie Chard NP-C Structural Heart Team  Pager: [phone] Phone: [phone]

## 2023-02-06 NOTE — Telephone Encounter (Signed)
I cancelled apt with Dr. Clifton James since apt with Laneta Simmers was the following day. She has an apt with [facility] on 7/8.

## 2023-02-11 DIAGNOSIS — I5032 Chronic diastolic (congestive) heart failure: Secondary | ICD-10-CM

## 2023-02-12 ENCOUNTER — Telehealth: Payer: Self-pay | Admitting: Internal Medicine

## 2023-02-12 ENCOUNTER — Ambulatory Visit: Payer: Medicare Other | Admitting: Cardiovascular Disease

## 2023-02-12 NOTE — Telephone Encounter (Signed)
Called pt was told pt is unable to come to the phone.  Left a message to call [facility].

## 2023-02-12 NOTE — Telephone Encounter (Signed)
Called pt reports was told would need dental work prior to surgery.  IS scheduled to see [facility] on July 15.  Pt thinks dental work will not be performed until Aug.  However, was hoping to have heart surgery the end of July.  Pt hopes once has dental visit office will be able to perform dental work before Aug.   Pt was told to keep provider updated.  Advised will send message to provider.

## 2023-02-12 NOTE — Telephone Encounter (Signed)
Patient returned RN's call. 

## 2023-02-12 NOTE — Telephone Encounter (Signed)
Thank you for the update. We will follow along.

## 2023-02-12 NOTE — Telephone Encounter (Signed)
Patient is requesting a call back to discuss dental extraction and possible having to move another procedure. Needing to discuss this. Requesting to speak to Cline Crock, PA.

## 2023-02-13 ENCOUNTER — Encounter: Payer: Self-pay | Admitting: Surgery

## 2023-02-13 ENCOUNTER — Institutional Professional Consult (permissible substitution) (INDEPENDENT_AMBULATORY_CARE_PROVIDER_SITE_OTHER): Payer: Medicare Other | Admitting: Surgery

## 2023-02-13 VITALS — BP 150/52 | HR 62 | Resp 20 | Ht 65.0 in | Wt 249.0 lb

## 2023-02-13 DIAGNOSIS — I350 Nonrheumatic aortic (valve) stenosis: Secondary | ICD-10-CM

## 2023-02-13 LAB — BASIC METABOLIC PANEL
BUN/Creatinine Ratio: 14 (ref 12–28)
BUN: 19 mg/dL (ref 8–27)
CO2: 27 mmol/L (ref 20–29)
Calcium: 9.5 mg/dL (ref 8.7–10.3)
Chloride: 102 mmol/L (ref 96–106)
Creatinine, Ser: 1.32 mg/dL — ABNORMAL HIGH (ref 0.57–1.00)
Glucose: 158 mg/dL — ABNORMAL HIGH (ref 70–99)
Potassium: 4.2 mmol/L (ref 3.5–5.2)
Sodium: 144 mmol/L (ref 134–144)
eGFR: 41 mL/min/{1.73_m2} — ABNORMAL LOW (ref >59)

## 2023-02-13 NOTE — Progress Notes (Signed)
Patient ID: Jamie [name], female   DOB: [birth year], 77 y.o.   MRN: [id number]  [facility]LIN[facility]VE CLINIC       [address]       [place] [address]             [phone]          CARDIOTHORACIC SURGERY CONSULTATION REPORT   PCP:  Raynelle Jan., MD   [facility] Cardiologist:  Yvonne Kendall, MD  [facility] Electrophysiologist:  None    Referring MD: Raynelle Jan., MD  Reason for consultation:  Severe aortic stenosis  HPI:   The patient is a 77 y.o. female with a hx of CAD s/p CABGx4V (2013 by me), HLD, DMT2, morbid obesity (BMI 42), OSA, COPD, carotid artery disease, breast CA s/p bilateral mastectomies, chronic diastolic CHF, and severe aortic stenosis who is referred for consideration of TAVR.   She previously suffered an NSTEMI in 2013 with diagnostic catheterization revealing severe multivessel disease, she underwent CABG x4. Stress testing was performed in May 2016 with finding of a small area of inferolateral ischemia. She was medically managed. She underwent repeat catheterization in 2019 in the setting of dyspnea on exertion and this revealed an atretic LIMA to the LAD, patent vein graft to the diagonal with an occluded jump portion of the graft to the OM 2, and a patent vein graft to the RCA. She had a 70% ostial left main and 50% proximal LAD stenosis. FFR was measured through these stenoses, and was normal at 0.82. She was placed on isosorbide therapy. She has also been followed over time for known aortic stenosis. Most recent echo 11/27/22 showed EF 55-60%, G2DD, and moderate AS with a mean gradient of 24 mm hg, peak gradient 45.7 mm hg, AVA 1.28cm2, DVI 0.5, SVI 45.    Seen in the office in May for worsening DOE, LE edema and arm discomfort. Initially PET stress was planned but converted to a heart cath given accelerating symptoms. [facility] 01/06/23 showed stable, multivessel native coronary artery disease  with patent vein graft to the distal RCA, patent vein graft to the D1 with known occlusion of the continuation to the OM 2, and atretic LIMA to the LAD. Overall, coronary anatomy felt to be stable. Filling pressures were elevated with a wedge of 20 and PA 43/20 with normal CO/CI. Additionally, she was also noted to have normal LV function and severe AS with a mean gradient of 56 mmHg and calculated valve area of 0.7 cm. In that setting, lasix increased to 40mg  BID. Metoprolol increased and nitrates reduced.     Seen back in the office by Ward Givens NP on 01/24/23 with worsening symptoms of SOB and LE edema despite increased urine output. Also complained on one episode of nitrate responsive chest pain. Lasix increased to 80mg  BID x3 days then dropped back down to 40mg  BID.  She said that she lost a considerable amount of weight and the lower extremity edema resolved with increase Lasix but since the dose was reduced she has gained the weight back and now has lower extremity edema again. She continues to complain of life limiting dyspnea despite diuresis. Cannot do anything without having to stop multiple times to catch her breath.  Her abdomen still feels tight and she complains of stomach pain and diarrhea which was worse on the 80mg  dose of lasix. Complains of leg pain. She chronically sleeps in a recliner.  She had not anymore recent episodes of chest pain. Occasional dizziness but no syncope. Of note, she had not seen a dentist in several years and recently saw a dentist concerning 5 broken off teeth.  She is post to see an oral surgeon next week about scheduling extraction of those 5 teeth.  She has not had any pain in her teeth or jaw.  Past Medical History:  Diagnosis Date   Anxiety    Aortic stenosis    a. 12/2014 Echo:  EF 60-65%, no RWMA, Gr 1 DD, Ao sclerosis, mild AS, mean 14 mmHg, MAC; b. 10/2020 Echo: EF 60-65%, no rwma, GrII DD, nl RV fxn, mildly dil LA, Mild-mod AS; c. 11/2022 Echo: EF 55-60%,  mod AS (AVA 1.28cm^2); d. Cath: Mean gradient , AVA 0.7 cm^2.   Asthma    Breast cancer (HCC) 08/2011   lt. breast ca   CAD (coronary artery disease)    a. 12/2011 NSTEMI/Cath: Sev 3VD; b. 12/2011 CABG x4: LIMA-LAD, SVG-RCA, SVG-D1-->OM2; c. 08/2017 Cath: Sev native dzs, LIMA->LAD atretic, VG->D1 ok w/ 100 @ continuation to OM2, VG->dRCA ok-->Med rx; c. 01/2023 Cath: LM 70ost, LAD 50p, LCX nl, RCA 157m, VG->dRCA nl, VG->D1 ok w/ 100% continuation to OM2, LIMA->LAD atretic-->overall unchanged->Med Rx.   Carotid artery disease (HCC)    a.  Pre-CABG Dopplers 5/13: Bilateral 40-59%; b.  Carotid US 6/16: RICA 40-59%, LICA 60-79%, normal subclavians bilaterally;  c. Carotid US 11/16: RICA 40-59%; LICA 40-59%, normal subclavians; d. Carotid US 11/17:  Stable 40-59% bilateral ICA stenoses; e. 07/2017 Carotid U/S: 1-39% bilat ICA stenoses.   Chronic heart failure with preserved ejection fraction (HFpEF) (HCC)    a. 10/2020 Echo: EF 60-65%, GrII DD; b. 11/2022 Echo: EF 55-60%, no rwma, GrII DD.   COPD (chronic obstructive pulmonary disease) (HCC)    DM2 (diabetes mellitus, type 2) (HCC)    metformin   Gallstones    Symptomatic   HLD (hyperlipidemia)    Hx of cardiovascular stress test    Myoview 5/16:  small area of inf-lat ischemia, EF 70%; Low Risk >> try medical Rx   Hx of migraines    Hypertension    Myocardial infarction (HCC)    Obesity    Shortness of breath    Sleep apnea    sleep study2-3 yrs ago lost weight afterward but gained back   UTI (lower urinary tract infection)    freq-bladder implant -removed    Past Surgical History:  Procedure Laterality Date   ABDOMINAL HYSTERECTOMY  1977   APPENDECTOMY     AXILLARY LYMPH NODE DISSECTION  10/18/2011   Procedure: AXILLARY LYMPH NODE DISSECTION;  Surgeon: Robyne Askew, MD;  Location: [facility] OR;  Service: General;  Laterality: Left;   BREAST RECONSTRUCTION  10/18/2011   Procedure: BREAST RECONSTRUCTION;  Surgeon: Wayland Denis, DO;   Location: [facility] OR;  Service: Plastics;  Laterality: Bilateral;   bilateral breast reconstruction with bilateral tissue expander and placement of flex hd.   CARDIAC CATHETERIZATION     CESAREAN SECTION  04/1976   CHOLECYSTECTOMY N/A 08/17/2018   Procedure: LAPAROSCOPIC CHOLECYSTECTOMY;  Surgeon: Abigail Miyamoto, MD;  Location: [facility] OR;  Service: General;  Laterality: N/A;   COLONOSCOPY WITH PROPOFOL N/A 06/20/2019   Procedure: COLONOSCOPY WITH PROPOFOL;  Surgeon: Jeani Hawking, MD;  Location: [facility] ENDOSCOPY;  Service: Endoscopy;  Laterality: N/A;   CORONARY ARTERY BYPASS GRAFT  12/13/2011   Procedure: CORONARY ARTERY BYPASS GRAFTING (CABG);  Surgeon: Alleen Borne, MD;  Location: [facility] OR;  Service: Open Heart Surgery;  Laterality: N/A;  Times four using endoscopically harvested left greater saphenous vein and left internal mammary artery. Right greater saphenous vein attempted; not appropriate for vein harvest.   CORONARY PRESSURE/FFR STUDY N/A 08/22/2017   Procedure: INTRAVASCULAR PRESSURE WIRE/FFR STUDY;  Surgeon: Yvonne Kendall, MD;  Location: [facility] INVASIVE CV LAB;  Service: Cardiovascular;  Laterality: N/A;   EYE SURGERY     bilateral cataract extraction    LAPAROSCOPIC GASTRIC BANDING  2010   LEFT HEART CATHETERIZATION WITH CORONARY ANGIOGRAM N/A 12/11/2011   Procedure: LEFT HEART CATHETERIZATION WITH CORONARY ANGIOGRAM;  Surgeon: Laurey Morale, MD;  Location: [facility] CATH LAB;  Service: Cardiovascular;  Laterality: N/A;   LESION REMOVAL  04/09/2012   Procedure: LESION REMOVAL;  Surgeon: Wayland Denis, DO;  Location: [facility];  Service: Plastics;  Laterality: Bilateral;   MASTECTOMY W/ SENTINEL NODE BIOPSY  10/18/2011   Procedure: MASTECTOMY WITH SENTINEL LYMPH NODE BIOPSY;  Surgeon: Robyne Askew, MD;  Location: [facility] OR;  Service: General;  Laterality: Bilateral;  bilateral mastectomy and left sentinel node biopsy   NASAL SINUS SURGERY     POLYPECTOMY  06/20/2019   Procedure: POLYPECTOMY;   Surgeon: Jeani Hawking, MD;  Location: [facility] ENDOSCOPY;  Service: Endoscopy;;   PORT-A-CATH REMOVAL  04/09/2012   Procedure: REMOVAL PORT-A-CATH;  Surgeon: Robyne Askew, MD;  Location: [facility];  Service: General;  Laterality: Right;   PORTACATH PLACEMENT  12/02/2011   Procedure: INSERTION PORT-A-CATH;  Surgeon: Robyne Askew, MD;  Location: [facility] OR;  Service: General;  Laterality: Right;   RIGHT/LEFT HEART CATH AND CORONARY/GRAFT ANGIOGRAPHY N/A 08/22/2017   Procedure: RIGHT/LEFT HEART CATH AND CORONARY/GRAFT ANGIOGRAPHY;  Surgeon: Yvonne Kendall, MD;  Location: [facility] INVASIVE CV LAB;  Service: Cardiovascular;  Laterality: N/A;   RIGHT/LEFT HEART CATH AND CORONARY/GRAFT ANGIOGRAPHY N/A 01/06/2023   Procedure: RIGHT/LEFT HEART CATH AND CORONARY/GRAFT ANGIOGRAPHY;  Surgeon: Yvonne Kendall, MD;  Location: [facility] INVASIVE CV LAB;  Service: Cardiovascular;  Laterality: N/A;   TONSILLECTOMY     US ECHOCARDIOGRAPHY     at [facility]   VIDEO BRONCHOSCOPY WITH ENDOBRONCHIAL NAVIGATION Left 11/26/2013   Procedure: VIDEO BRONCHOSCOPY WITH ENDOBRONCHIAL NAVIGATION;  Surgeon: Leslye Peer, MD;  Location: [facility] OR;  Service: Thoracic;  Laterality: Left;    Family History  Problem Relation Age of Onset   Heart disease Mother    Heart disease Father    Breast cancer Maternal Aunt    Ovarian cancer Paternal Aunt    Breast cancer Maternal Aunt    Anesthesia problems Neg Hx    Hypotension Neg Hx    Malignant hyperthermia Neg Hx    Pseudochol deficiency Neg Hx     Social History   Socioeconomic History   Marital status: Married    Spouse name: Not on file   Number of children: 6   Years of education: Not on file   Highest education level: Not on file  Occupational History   Occupation: retired  Tobacco Use   Smoking status: Former    Current packs/day: 0.00    Average packs/day: 2.0 packs/day for 40.0 years (80.0 ttl pk-yrs)    Types: Cigarettes    Start date: 08/25/1966    Quit  date: 08/25/2006    Years since quitting: 16.4   Smokeless tobacco: Never  Vaping Use   Vaping status: Never Used  Substance and Sexual Activity   Alcohol use: Yes  Comment: 1-2 times a year   Drug use: No   Sexual activity: Not Currently  Other Topics Concern   Not on file  Social History Narrative   Not on file   Social Determinants of Health   Financial Resource Strain: Not on file  Food Insecurity: Low Risk  (10/11/2022)   [facility]   Food vital sign    Within the past 12 months, you worried that your food would run out before you got money to buy more: Never true    Within the past 12 months, the food you bought just didn't last and you didn't have money to get more. : Never true  Transportation Needs: Not on file (10/11/2022)  Physical Activity: Not on file  Stress: Not on file  Social Connections: Not on file  Intimate Partner Violence: Not on file    Prior to Admission medications   Medication Sig Start Date End Date Taking? Authorizing Provider  albuterol (PROVENTIL) (2.5 MG/3ML) 0.083% nebulizer solution Inhale 3 mLs into the lungs every 6 (six) hours as needed for wheezing or shortness of breath. 02/19/18  Yes [provider]  albuterol (VENTOLIN HFA) 108 (90 Base) MCG/ACT inhaler Inhale 1-2 puffs into the lungs every 6 (six) hours as needed for wheezing or shortness of breath.   Yes [provider]  aspirin EC 81 MG tablet Take 1 tablet (81 mg total) by mouth daily. Patient taking differently: Take 81 mg by mouth every evening. 04/26/16  Yes Weaver, Scott T, PA-C  atorvastatin (LIPITOR) 80 MG tablet TAKE 1 TABLET BY MOUTH ONCE DAILY AT 6 PM 12/12/22  Yes End, Cristal Deer, MD  cephALEXin (KEFLEX) 500 MG capsule Take 500 mg by mouth in the morning. 03/05/17  Yes [provider]  Cholecalciferol (VITAMIN D3) 1000 units CAPS Take 1,000 Units by mouth in the morning.   Yes [provider]  ezetimibe (ZETIA) 10  MG tablet Take 10 mg by mouth in the morning.   Yes [provider]  fluticasone (FLONASE) 50 MCG/ACT nasal spray Place 2 sprays into both nostrils daily as needed for allergies. 08/14/13  Yes [provider]  furosemide (LASIX) 40 MG tablet Take 1 tablet (40 mg total) by mouth 2 (two) times daily. 01/06/23  Yes End, Cristal Deer, MD  isosorbide mononitrate (IMDUR) 60 MG 24 hr tablet Take 1 tablet (60 mg total) by mouth daily. 01/06/23  Yes End, Cristal Deer, MD  lisinopril (ZESTRIL) 5 MG tablet Take 1 tablet by mouth once daily 01/21/23  Yes Hammock, Sheri, NP  loratadine (CLARITIN) 10 MG tablet Take 10 mg by mouth every evening.   Yes [provider]  metFORMIN (GLUCOPHAGE) 500 MG tablet Take 1 tablet (500 mg total) by mouth every evening. 01/09/23  Yes End, Cristal Deer, MD  metoprolol tartrate (LOPRESSOR) 50 MG tablet Take 1 tablet (50 mg total) by mouth 2 (two) times daily. 01/06/23  Yes End, Cristal Deer, MD  Multiple Vitamin (MULTI VITAMIN DAILY PO) Take 1 tablet by mouth in the morning.   Yes [provider]  nitroGLYCERIN (NITROSTAT) 0.4 MG SL tablet Place 1 tablet (0.4 mg total) under the tongue every 5 (five) minutes as needed. 06/22/18  Yes End, Cristal Deer, MD  Polyethyl Glycol-Propyl Glycol 0.4-0.3 % SOLN Place 1 drop into both eyes 4 (four) times daily as needed (for dry eyes).    Yes [provider]  potassium chloride 20 MEQ TBCR Take 1 tablet (20 mEq total) by mouth daily. 01/24/23  04/24/23 Yes Creig Hines, NP    Current Outpatient Medications  Medication Sig Dispense Refill   albuterol (PROVENTIL) (2.5 MG/3ML) 0.083% nebulizer solution Inhale 3 mLs into the lungs every 6 (six) hours as needed for wheezing or shortness of breath.  2   albuterol (VENTOLIN HFA) 108 (90 Base) MCG/ACT inhaler Inhale 1-2 puffs into the lungs every 6 (six) hours as needed for wheezing or shortness of breath.     aspirin EC 81 MG tablet Take 1 tablet (81 mg  total) by mouth daily. (Patient taking differently: Take 81 mg by mouth every evening.)     atorvastatin (LIPITOR) 80 MG tablet TAKE 1 TABLET BY MOUTH ONCE DAILY AT 6 PM 90 tablet 0   cephALEXin (KEFLEX) 500 MG capsule Take 500 mg by mouth in the morning.     Cholecalciferol (VITAMIN D3) 1000 units CAPS Take 1,000 Units by mouth in the morning.     ezetimibe (ZETIA) 10 MG tablet Take 10 mg by mouth in the morning.     fluticasone (FLONASE) 50 MCG/ACT nasal spray Place 2 sprays into both nostrils daily as needed for allergies.     furosemide (LASIX) 40 MG tablet Take 1 tablet (40 mg total) by mouth 2 (two) times daily. 60 tablet 5   isosorbide mononitrate (IMDUR) 60 MG 24 hr tablet Take 1 tablet (60 mg total) by mouth daily. 180 tablet 0   lisinopril (ZESTRIL) 5 MG tablet Take 1 tablet by mouth once daily 90 tablet 3   loratadine (CLARITIN) 10 MG tablet Take 10 mg by mouth every evening.     metFORMIN (GLUCOPHAGE) 500 MG tablet Take 1 tablet (500 mg total) by mouth every evening.     metoprolol tartrate (LOPRESSOR) 50 MG tablet Take 1 tablet (50 mg total) by mouth 2 (two) times daily. 60 tablet 5   Multiple Vitamin (MULTI VITAMIN DAILY PO) Take 1 tablet by mouth in the morning.     nitroGLYCERIN (NITROSTAT) 0.4 MG SL tablet Place 1 tablet (0.4 mg total) under the tongue every 5 (five) minutes as needed. 25 tablet 3   Polyethyl Glycol-Propyl Glycol 0.4-0.3 % SOLN Place 1 drop into both eyes 4 (four) times daily as needed (for dry eyes).      potassium chloride 20 MEQ TBCR Take 1 tablet (20 mEq total) by mouth daily. 30 tablet 3   No current facility-administered medications for this visit.    Allergies  Allergen Reactions   Penicillins Nausea Only and Other (See Comments)    DID THE REACTION INVOLVE: Swelling of the face/tongue/throat, SOB, or low BP? No Sudden or severe rash/hives, skin peeling, or the inside of the mouth or nose? No Did it require medical treatment? #  #  #  YES  #  #  #   When did it last happen? Unknown    Vicodin [Hydrocodone-Acetaminophen] Other (See Comments)    Syncope    Duloxetine Hcl Nausea Only   Sulfa Antibiotics Rash   Sulfamethoxazole Rash      Review of Systems:   General:  + decreased appetite, + decreased energy, + weight gain, no weight loss, no fever  Cardiac:  no chest pain with exertion, no chest pain at rest, +SOB with minimal exertion, no resting SOB, no PND, + orthopnea, no palpitations, no arrhythmia, no atrial fibrillation, + LE edema, no dizzy spells, no syncope  Respiratory:  + exertional shortness of breath, no home oxygen, no productive cough, no dry cough, no bronchitis,  no wheezing, no hemoptysis, no asthma, no pain with inspiration or cough, + sleep apnea, no CPAP at night  GI:   no difficulty swallowing, no reflux, no frequent heartburn, no hiatal hernia, + abdominal pain, no constipation, + diarrhea, no hematochezia, no hematemesis, no melena  GU:   no dysuria,  + frequency, no urinary tract infection, no hematuria, no kidney stones, no kidney disease  Vascular:  no pain suggestive of claudication, no pain in feet, no leg cramps, no varicose veins, no DVT, no non-healing foot ulcer  Neuro:   no stroke, no TIA's, no seizures, no headaches, no temporary blindness one eye,  no slurred speech, no peripheral neuropathy, no chronic pain, no instability of gait, no memory/cognitive dysfunction  Musculoskeletal: + arthritis, bi joint swelling, bi myalgias, + difficulty walking, + decreased mobility   Skin:   no rash, no itching, no skin infections, no pressure sores or ulcerations  Psych:   no anxiety, + depression, no nervousness, no unusual recent stress  Eyes:   no blurry vision, + floaters, no recent vision changes, + wears glasses   ENT:   + hearing loss, no loose or painful teeth, no dentures, last saw dentist 4 days ago.  Hematologic:  + easy bruising, no abnormal bleeding, no clotting disorder, no frequent  epistaxis  Endocrine:  + diabetes, does not check CBG's at home     Physical Exam:   BP (!) 150/52   Pulse 62   Resp 20   Ht 5\' 5"  (1.651 m)   Wt 249 lb (112.9 kg)   SpO2 95% Comment: RA  BMI 41.44 kg/m   General:  Obese woman, well-appearing  HEENT:  Unremarkable, NCAT, PERLA, EOMI  Neck:   no JVD, + bruit or transmitted murmur to both sides of neck, no adenopathy   Chest:   clear to auscultation, symmetrical breath sounds, no wheezes, no rhonchi   CV:   RRR, 3/6 systolic murmur RSB, no diastolic murmur  Abdomen:  soft, non-tender, no masses   Extremities:  warm, well-perfused, pulses palpable at ankle, mild lower extremity edema  Rectal/GU  Deferred  Neuro:   Grossly non-focal and symmetrical throughout  Skin:   Clean and dry, no rashes, no breakdown  Diagnostic Tests:  Lab Results: No results for input(s): "WBC", "HGB", "HCT", "PLT" in the last 72 hours. BMET:  Recent Labs    02/12/23 1245  NA 144  K 4.2  CL 102  CO2 27  GLUCOSE 158*  BUN 19  CREATININE 1.32*  CALCIUM 9.5    ECHOCARDIOGRAM REPORT       Patient Name:   Dereck Leep Date of Exam: 11/27/2022  Medical Rec #:  [id number]     Height:       65.0 in  Accession #:    [id number]    Weight:       255.0 lb  Date of Birth:  08-12-1945     BSA:          2.193 m  Patient Age:    62 years      BP:           154/72 mmHg  Patient Gender: F             HR:           62 bpm.  Exam Location:  [facility]   Procedure: 2D Echo and Intracardiac Opacification Agent   Indications:    I35.0 Nonrheumatic aortic (valve) stenosis  History:        Patient has prior history of Echocardiogram examinations,  most                 recent 10/23/2021. CHF, CAD and Previous Myocardial  Infarction,                 Prior CABG, COPD, Aortic Valve Disease,  Signs/Symptoms:Dyspnea                 and Fatigue; Risk Factors:Sleep Apnea, Former Smoker,                  Hypertension and Diabetes.    Sonographer:    Quentin Ore  RDMS, RVT, RDCS  Referring Phys: [id number] CHRISTOPHER RONALD BERGE   IMPRESSIONS     1. Left ventricular ejection fraction, by estimation, is 55 to 60%. The  left ventricle has normal function. The left ventricle has no regional  wall motion abnormalities. Left ventricular diastolic parameters are  consistent with Grade II diastolic  dysfunction (pseudonormalization).   2. Right ventricular systolic function is normal. The right ventricular  size is normal.   3. Left atrial size was mildly dilated.   4. The mitral valve is normal in structure. No evidence of mitral valve  regurgitation.   5. The aortic valve was not well visualized. Aortic valve regurgitation  is not visualized. Moderate aortic valve stenosis. Aortic valve area, by  VTI measures 1.28 cm. Aortic valve mean gradient measures 24.0 mmHg.  Aortic valve Vmax measures 3.38 m/s.   6. The inferior vena cava is normal in size with greater than 50%  respiratory variability, suggesting right atrial pressure of 3 mmHg.   Comparison(s): Previous AV meas reported as 14max/20mean PG.   FINDINGS   Left Ventricle: Left ventricular ejection fraction, by estimation, is 55  to 60%. The left ventricle has normal function. The left ventricle has no  regional wall motion abnormalities. Definity contrast agent was given IV  to delineate the left ventricular   endocardial borders. The left ventricular internal cavity size was normal  in size. There is no left ventricular hypertrophy. Left ventricular  diastolic parameters are consistent with Grade II diastolic dysfunction  (pseudonormalization).   Right Ventricle: The right ventricular size is normal. No increase in  right ventricular wall thickness. Right ventricular systolic function is  normal.   Left Atrium: Left atrial size was mildly dilated.   Right Atrium: Right atrial size was not well visualized.   Pericardium: There is no evidence of pericardial effusion.   Mitral Valve:  The mitral valve is normal in structure. No evidence of  mitral valve regurgitation.   Tricuspid Valve: The tricuspid valve is normal in structure. Tricuspid  valve regurgitation is not demonstrated.   Aortic Valve: The aortic valve was not well visualized. Aortic valve  regurgitation is not visualized. Moderate aortic stenosis is present.  Aortic valve mean gradient measures 24.0 mmHg. Aortic valve peak gradient  measures 45.7 mmHg. Aortic valve area, by   VTI measures 1.28 cm.   Pulmonic Valve: The pulmonic valve was not well visualized. Pulmonic valve  regurgitation is not visualized.   Aorta: The aortic root is normal in size and structure.   Venous: The inferior vena cava is normal in size with greater than 50%  respiratory variability, suggesting right atrial pressure of 3 mmHg.   IAS/Shunts: No atrial level shunt detected by color flow Doppler.     LEFT VENTRICLE  PLAX 2D  LVIDd:  5.10 cm   Diastology  LVIDs:         3.80 cm   LV e' medial:    5.22 cm/s  LV PW:         0.90 cm   LV E/e' medial:  36.0  LV IVS:        0.90 cm   LV e' lateral:   6.64 cm/s  LVOT diam:     1.80 cm   LV E/e' lateral: 28.3  LV SV:         99  LV SV Index:   45  LVOT Area:     2.54 cm     RIGHT VENTRICLE            IVC  RV S prime:     9.14 cm/s  IVC diam: 1.60 cm  TAPSE (M-mode): 3.1 cm   LEFT ATRIUM           Index  LA diam:      4.40 cm 2.01 cm/m  LA Vol (A4C): 83.9 ml 38.25 ml/m   AORTIC VALVE                     PULMONIC VALVE  AV Area (Vmax):    1.10 cm      PV Vmax:       1.52 m/s  AV Area (Vmean):   1.14 cm      PV Peak grad:  9.3 mmHg  AV Area (VTI):     1.28 cm  AV Vmax:           338.00 cm/s  AV Vmean:          234.000 cm/s  AV VTI:            0.772 m  AV Peak Grad:      45.7 mmHg  AV Mean Grad:      24.0 mmHg  LVOT Vmax:         146.00 cm/s  LVOT Vmean:        105.000 cm/s  LVOT VTI:          0.389 m  LVOT/AV VTI ratio: 0.50    AORTA  Ao Root diam:  3.20 cm  Ao Asc diam:  2.50 cm   MITRAL VALVE  MV Area (PHT): 2.39 cm     SHUNTS  MV Decel Time: 317 msec     Systemic VTI:  0.39 m  MV E velocity: 188.00 cm/s  Systemic Diam: 1.80 cm  MV A velocity: 103.00 cm/s  MV E/A ratio:  1.83   Debbe Odea MD  Electronically signed by Debbe Odea MD  Signature Date/Time: 11/27/2022/2:51:07 PM        Final      Physicians  Panel Physicians Referring Physician Case Authorizing Physician  End, Cristal Deer, MD (Primary)     Procedures  RIGHT/LEFT HEART CATH AND CORONARY/GRAFT ANGIOGRAPHY   Conclusion  Conclusions: Severe ostial LMCA disease and chronic total occlusion of mid RCA.  There is also moderate disease of mid LAD.  Overall appearance is similar to prior catheterization in 2019. Widely patent SVG-D1 with chronic total occlusion of sequential portion to OM. Widely patent SVG-RCA. Small LIMA-LAD that has partial retrograde filling from the LAD (known to be atretic by prior cath). Mildly elevated left and right heart filling pressures. Normal Fick cardiac output/index. Severe aortic valve stenosis (cannot exclude some element of subvalvular/LVOT gradient).   Recommendations: Increase diuresis. Increase metoprolol and reduce  isosorbide mononitrate. Aggressive secondary prevention of coronary artery disease. Consider referral to Dr. McAlhany/structural heart team for further evaluation/management of aortic stenosis if symptoms do not improve at follow-up.   Yvonne Kendall, MD [facility]     Recommendations  Antiplatelet/Anticoag Continue indefinite aspirin 81 mg daily.  Discharge Date In the absence of any other complications or medical issues, we expect the patient to be ready for discharge from a cath perspective on 01/06/2023.   Surgeon Notes    01/06/2023  9:29 AM Brief Op Note signed by Yvonne Kendall, MD   Indications  Dyspnea on exertion [R06.09 (ICD-10-CM)]  Nonrheumatic aortic valve stenosis  [I35.0 (ICD-10-CM)]  Coronary artery disease of native artery of native heart with stable angina pectoris (HCC) [I25.118 (ICD-10-CM)]   Clinical Presentation  CHF/Shock Congestive heart failure present. NYHA Class III. No shock present.   Procedural Details  Technical Details Indication: 77 y.o. year-old woman with history of coronary artery disease status post CABG (2013), diastolic heart failure, mild to moderate aortic stenosis, hyperlipidemia, carotid artery stenosis, breast cancer status post bilateral mastectomies, diabetes, COPD, sleep apnea, and obesity, presenting for evaluation of progressive dyspnea on exertion.  GFR: 51 ml/min  Procedure: The risks, benefits, complications, treatment options, and expected outcomes were discussed with the patient. The patient and/or family concurred with the proposed plan, giving informed consent. Right arm access was used given history of left breast cancer and known atresia of LIMA-LAD.  The right wrist and elbow were prepped and draped in a sterile fashion. 1% lidocaine was used for local anesthesia. Ultrasound was used to evaluate the right basilic vein. It was patent.  A micropuncture needle was used to access the right basilic vein under ultrasound guidance. A 68F slender Glidesheath was inserted using modified Seldinger technique. Right heart catheterization was performed by advancing a 68F balloon-tipped catheter through the right heart chambers into the pulmonary capillary wedge position. Pressure measurements and oxygen saturations were obtained.  Ultrasound was used to evaluate the right radial artery. It was patent.  A micropuncture needle was used to access the right radial artery under ultrasound guidance. Using the modified Seldinger access technique, a 29F slender Glidesheath was placed in the right radial artery. 3 mg Verapamil was given through the sheath. Heparin 5,000 units were administered.  Selective coronary angiography was performed  using a 68F JL3.5 catheter to engage the left coronary artery and a 68F JR4 catheter to engage the right coronary artery. Selective bypass graft angiography was performed using 68F JR4 to engage the SVG-D1-OM and 68F JL3.5 to engage the SVG-RCA.  LIMA-LAD was not selectively engaged as it is known to be atretic.  Left heart catheterization was performed using a 68F JR catheter. Left ventriculogram was performed with a hand injection of contrast. The JR4 was exchanged for a 29F dual lumen pigtail catheter to allow for simultaneous LV and aortic pressure measurement.  At the end of the procedure, the radial artery sheath was removed and a Zephyr band applied to achieve patent hemostasis. The basilic vein sheath was removed and hemostasis achieved with manual compression. There were no immediate complications. The patient was taken to the recovery area in stable condition.   Estimated blood loss <50 mL.   During this procedure medications were administered to achieve and maintain moderate conscious sedation while the patient's heart rate, blood pressure, and oxygen saturation were continuously monitored and I was present face-to-face 100% of this time.   Medications (Filter: Administrations occurring from [phone] to [phone]  on 01/06/23)  important  Continuous medications are totaled by the amount administered until 01/06/23 0923.   fentaNYL (SUBLIMAZE) injection (mcg)  Total dose: 25 mcg Date/Time Rate/Dose/Volume Action   01/06/23 0803 25 mcg Given   midazolam (VERSED) injection (mg)  Total dose: 1 mg Date/Time Rate/Dose/Volume Action   01/06/23 0803 1 mg Given   Heparin (Porcine) in NaCl 1000-0.9 UT/500ML-% SOLN (mL)  Total volume: 1,000 mL Date/Time Rate/Dose/Volume Action   01/06/23 0806 500 mL Given   0806 500 mL Given   lidocaine (PF) (XYLOCAINE) 1 % injection (mL)  Total volume: 4 mL Date/Time Rate/Dose/Volume Action   01/06/23 0806 2 mL Given   0806 2 mL Given   Radial Cocktail/Verapamil only  (mL)  Total volume: 10 mL Date/Time Rate/Dose/Volume Action   01/06/23 0817 10 mL Given   heparin sodium (porcine) injection (Units)  Total dose: 5,000 Units Date/Time Rate/Dose/Volume Action   01/06/23 0819 5,000 Units Given   Radial Cocktail/Verapamil only (mL)  Total volume: 10 mL Date/Time Rate/Dose/Volume Action   01/06/23 0904 10 mL Given   iohexol (OMNIPAQUE) 350 MG/ML injection (mL)  Total volume: 95 mL Date/Time Rate/Dose/Volume Action   01/06/23 0906 95 mL Given    Sedation Time  Sedation Time Physician-1: 1 hour 1 minute 50 seconds Contrast     Administrations occurring from 0739 to 0923 on 01/06/23:  Medication Name Total Dose  iohexol (OMNIPAQUE) 350 MG/ML injection 95 mL   Radiation/Fluoro  Fluoro time: 14.3 (min) DAP: 35.2 (Gycm2) Cumulative Air Kerma: 742.1 (mGy) Complications  Complications documented before study signed (01/06/2023  1:28 PM)   No complications were associated with this study.  Documented by Mila Palmer, RN - 01/06/2023  9:15 AM     Coronary Findings  Diagnostic Dominance: Right Left Main  Vessel is large.  Ost LM lesion is 70% stenosed.    Left Anterior Descending  Vessel is large.  Prox LAD lesion is 50% stenosed.    First Diagonal Branch  Vessel is moderate in size.    Second Diagonal Branch  Vessel is small in size.    Left Circumflex  Vessel is moderate in size. Vessel is angiographically normal.    First Obtuse Marginal Branch  Vessel is small in size.    Second Obtuse Marginal Branch  Vessel is moderate in size.    Right Coronary Artery  Vessel is moderate in size.  Mid RCA lesion is 100% stenosed. The lesion is chronically occluded.    Right Posterior Descending Artery  Vessel is moderate in size. Vessel is angiographically normal.    Right Posterior Atrioventricular Artery  Vessel is large in size. Vessel is angiographically normal.    Saphenous Graft To Dist RCA  SVG and is large. The graft  exhibits no disease.    Sequential Saphenous Graft To 1st Diag, 2nd Mrg  SVG and is large.  Origin to Insertion lesion between 1st Diag and 2nd Mrg is 100% stenosed. The lesion is chronically occluded.    LIMA LIMA Graft To Mid LAD  LIMA and is small. The graft is atretic. There is competitive flow.    Intervention   No interventions have been documented.   Right Heart  Right Heart Pressures RA (mean): 9 mmHg RV (S/EDP): 47/10 mmHg PA (S/D, mean): 43/20 (28) mmHg PCWP (mean): 20 mmHg  Ao sat: 96% PA sat: 70%  Fick CO: 5.8 L/min Fick CI: 2.7 L/min/m^2  PVR: 1.4 Wood units   Wall Motion  The following segments are hyperkinetic: mid anterior, mid inferior, basilar anterior, basilar inferior, apical anterior and apical inferior.           Left Heart  Left Ventricle The left ventricular size is normal. There is hyperdynamic left ventricular systolic function. LV end diastolic pressure is mildly elevated. LVEDP 18 mmHg. The left ventricular ejection fraction is greater than 65% by visual estimate. No regional wall motion abnormalities.  Aortic Valve There is severe aortic valve stenosis. An element of subvalvular/LVOT gradient may be contributing.  Mean gradient 56 mmHg.  Calculated valve area 0.7 cm^2.   Coronary Diagrams  Diagnostic Dominance: Right  Intervention   Implants   No implant documentation for this case.   Syngo Images   Show images for CARDIAC CATHETERIZATION Images on Long Term Storage   Show images for Brandon, Ruthanna V Link to Procedure Log  Procedure Log   Link to Procedure Log  Procedure Log    Hemo Data  Flowsheet Row Most Recent Value  Fick Cardiac Output 5.75 L/min  Fick Cardiac Output Index 2.65 (L/min)/BSA  Aortic Mean Gradient 53.15 mmHg  Aortic Peak Gradient 72.2 mmHg  Aortic Valve Area 0.65  Aortic Value Area Index 0.3 cm2/BSA  RA A Wave 7 mmHg  RA V Wave 7 mmHg  RA Mean 5 mmHg  RV Systolic Pressure 37 mmHg  RV  Diastolic Pressure 0 mmHg  RV EDP 8 mmHg  PA Systolic Pressure 35 mmHg  PA Diastolic Pressure 11 mmHg  PA Mean 23 mmHg  PW A Wave 17 mmHg  PW V Wave 16 mmHg  PW Mean 12 mmHg  AO Systolic Pressure 116 mmHg  AO Diastolic Pressure 63 mmHg  AO Mean 73 mmHg  LV Systolic Pressure 172 mmHg  LV Diastolic Pressure 9 mmHg  LV EDP 22 mmHg  AOp Systolic Pressure 129 mmHg  AOp Diastolic Pressure 58 mmHg  AOp Mean Pressure 89 mmHg  LVp Systolic Pressure 188 mmHg  LVp Diastolic Pressure 3 mmHg  LVp EDP Pressure 18 mmHg  QP/QS 1  TPVR Index 8.68 HRUI   ADDENDUM REPORT: 02/07/2023 17:16   CLINICAL DATA:  Aortic valve replacement (TAVR), pre-op eval   EXAM: Cardiac TAVR CT   TECHNIQUE: The patient was scanned on a Siemens Force 192 slice scanner. A 120 kV retrospective scan was triggered in the descending thoracic aorta at 111 HU's. Gantry rotation speed was 270 msecs and collimation was .9 mm. The 3D data set was reconstructed in 5% intervals of the R-R cycle. Systolic and diastolic phases were analyzed on a dedicated work station using MPR, MIP and VRT modes. The patient received OMNIPAQUE IOHEXOL 350 MG/ML SOLN of contrast.   FINDINGS: Aortic Valve:   Tricuspid aortic valve with severely reduced cusp excursion. Severely thickened and severely calcified aortic valve cusps.   AV calcium score: 1206   Virtual Basal Annulus Measurements: 25%   Maximum/Minimum Diameter: 22.1 x 18.8 mm   Perimeter: 64.1 mm   Area:  320 mm2   LVOT calcification below RCC/LCC commissure.   Membranous septal length: 5.5 mm   Based on these measurements, the annulus would be suitable for a 20 mm Sapien 3 valve. Alternatively, Heart Team can consider 26 mm Evolut valve. Recommend Heart Team discussion for valve selection.   Sinus of Valsalva Measurements:   Non-coronary:  28 mm   Right - coronary:  29 mm   Left - coronary:  29 mm   Sinus of Valsalva Height:  Left: 19.5 mm    Right: 19.9 mm   Aorta: Conventional 3 vessel branch pattern of aortic arch. Severe aortic atherosclerosis, and atherosclerosis of the proximal left subclavian.   Sinotubular Junction:  23 mm   Ascending Thoracic Aorta:  27 mm   Aortic Arch:  23 mm   Coronary Artery Height above Annulus:   Left main: 12.2 mm   Right coronary: 16.5 mm   Coronary Arteries: Normal coronary origin. Right dominance. The study was performed without use of NTG and insufficient for plaque evaluation. Prior CABG, see coronary angiography 01/06/23 for details of anatomy.   Optimum Fluoroscopic Angle for Delivery: RAO 5 CRA 8   OTHER:   Left atrial appendage: No thrombus.   Mitral valve: Degenerative with moderate mitral annular calcifications.   Pulmonary artery: Dilated branch PAs.   Pulmonary veins: Normal anatomy.   IMPRESSION: 1. Tricuspid aortic valve with severely reduced cusp excursion. Severely thickened and severely calcified aortic valve cusps. 2. Aortic valve calcium score: 1206 3. Annulus area: 320 mm2, suitable for 20 mm Sapien 3 valve vs 26 mm Evolut valve. LVOT calcifications below RCC/LCC commissure. Membranous septal length 5.5 mm. Left ventricular hypertrophy narrows the LVOT. 4. Sufficient coronary artery heights from annulus. 5. Optimum fluoroscopic angle for delivery: RAO 5 CRA 8     Electronically Signed   By: Weston Brass M.D.   On: 02/07/2023 17:16      EXAM: CT ANGIOGRAPHY CHEST, ABDOMEN AND PELVIS   TECHNIQUE: Non-contrast CT of the chest was initially obtained.   Multidetector CT imaging through the chest, abdomen and pelvis was performed using the standard protocol during bolus administration of intravenous contrast. Multiplanar reconstructed images and MIPs were obtained and reviewed to evaluate the vascular anatomy.   RADIATION DOSE REDUCTION: This exam was performed according to the departmental dose-optimization program which includes  automated exposure control, adjustment of the mA and/or kV according to patient size and/or use of iterative reconstruction technique.   CONTRAST:  OMNIPAQUE IOHEXOL 350 MG/ML SOLN   COMPARISON:  Chest CT dated March 30, 2018   FINDINGS: CTA CHEST FINDINGS   Cardiovascular: Mild cardiomegaly. No pericardial effusion. Aortic valve thickening and calcifications. Normal caliber thoracic aorta with moderate atherosclerotic disease. Severe coronary artery calcifications status post CABG. Severe narrowing of the proximal left subclavian artery due to calcified plaque.   Mediastinum/Nodes: Small hiatal hernia. No enlarged lymph nodes seen in the chest.   Lungs/Pleura: Central airways are patent. Left upper lobe linear opacities unchanged when multiple prior exams and likely due to scarring. No consolidation, pleural effusion or pneumothorax. Stable small solid pulmonary nodule of the left upper lobe measuring 5 mm on series 10, image 83, considered given greater than 1 year stability benign with no further follow-up imaging necessary.   Musculoskeletal: No chest wall abnormality. No acute or significant osseous findings.z   CTA ABDOMEN AND PELVIS FINDINGS   Hepatobiliary: Hepatic steatosis. No suspicious focal liver lesions. Prior cholecystectomy. No biliary ductal dilation.   Pancreas: Unremarkable. No pancreatic ductal dilatation or surrounding inflammatory changes.   Spleen: Normal in size without focal abnormality.   Adrenals/Urinary Tract: Bilateral adrenal glands are unremarkable. No hydronephrosis or nephrolithiasis. Indeterminate exophytic lesion of the lower pole the right kidney measuring 9 mm on series 9 image 163. Additional bilateral low-attenuation renal lesions which are consistent with simple cysts. Bladder is unremarkable.   Stomach/Bowel: Lap band device in place. No evidence of bowel wall thickening, distention, or inflammatory changes.  Vascular/lymphatic: Normal caliber abdominal aorta with severe atherosclerotic disease. No enlarged lymph nodes seen in the abdomen or pelvis.   Reproductive: No adnexal mass.   Other: No abdominal wall hernia or abnormality. No abdominopelvic ascites.   Musculoskeletal: No acute or significant osseous findings.   VASCULAR MEASUREMENTS PERTINENT TO TAVR:   AORTA:   Minimal Aortic Diameter-7.7 mm   Severity of Aortic Calcification-severe   RIGHT PELVIS:   Right Common Iliac Artery -   Minimal Diameter-5.3 mm   Tortuosity-none   Calcification-moderate   Right External Iliac Artery -   Minimal Diameter-6.2 mm   Tortuosity-moderate   Calcification-mild   Right Common Femoral Artery -   Minimal Diameter-3.4 mm   Tortuosity-none   Calcification-severe   LEFT PELVIS:   Left Common Iliac Artery -   Minimal Diameter-5.0 mm   Tortuosity-mild   Calcification-severe   Left External Iliac Artery -   Minimal Diameter-6.7 mm   Tortuosity-moderate   Calcification-none   Left Common Femoral Artery -   Minimal Diameter-4.2 mm   Tortuosity-mild   Calcification-severe   Review of the MIP images confirms the above findings.   IMPRESSION: Vascular:   1. Vascular findings and measurements pertinent to potential TAVR procedure, as detailed above. 2. Thickening and calcification of the aortic valve, compatible with reported clinical history of aortic stenosis. 3. Severe aortoiliac atherosclerosis. Coronary artery disease status post CABG.   Nonvascular:   1. Indeterminate exophytic lesion of the lower pole the right kidney measuring 9 mm. Recommend further evaluation with contrast-enhanced renal protocol MRI or CT. 2. Hepatic steatosis.     Electronically Signed   By: Allegra Lai M.D.   On: 02/04/2023 14:24     Impression:  This 77 year old woman has stage D, severe, symptomatic aortic stenosis with NYHA class III symptoms of  progressive exertional fatigue and shortness of breath consistent with chronic diastolic congestive heart failure.  I have personally reviewed her 2D echocardiogram, cardiac catheterization, and CTA studies.  Her aortic valve is not well-visualized on echocardiogram but the mean gradient was 24 mmHg with a peak of 46 mmHg.  Left ventricular ejection fraction was 55 to 60% with grade 2 diastolic dysfunction.  Cardiac catheterization showed high-grade ostial left main coronary stenosis as well as total occlusion of the mid RCA and moderate disease in the mid LAD essentially unchanged from her prior catheterization in 2019.  There is an atretic LIMA to the LAD.  There is a widely patent vein graft to the diagonal branch with chronic total occlusion of the sequential portion to the obtuse marginal.  There is a widely patent vein graft to the RCA.  The mean gradient measured across the aortic valve was 53 mmHg with a peak gradient of 72 mmHg and a valve area of 0.65 cm consistent with severe aortic stenosis.  Right heart pressures were within normal limits.  LVEDP was 22 mmHg.  I agree that aortic valve replacement is indicated in this patient for relief of her progressively worsening symptoms and to prevent left ventricular deterioration.  Given her age, prior coronary bypass surgery, and morbid obesity I think that transcatheter aortic valve replacement be the best option for treating her.  Her gated cardiac CTA shows a relatively small aortic annulus that would be suitable for a 20 mm SAPIEN 3 valve or a 26 mm Evolut FX valve.  Given her BMI of 41 I think an Evolut FX would be best.  Her abdominal and pelvic CTA shows adequate pelvic  vascular anatomy to allow transfemoral insertion.  She has 5 broken off teeth that need to be extracted but said that she does not think the oral surgeon will be able to do that till later in August.  She is concerned about waiting until after that to have her valve replaced because of  her worsening symptoms.  She is not having any pain from her teeth and I think it would be reasonable to proceed ahead with TAVR with plans to have her broken teeth extracted postoperatively.  The patient and her son were counseled at length regarding treatment alternatives for management of severe symptomatic aortic stenosis. The risks and benefits of surgical intervention has been discussed in detail. Long-term prognosis with medical therapy was discussed. Alternative approaches such as conventional surgical aortic valve replacement, transcatheter aortic valve replacement, and palliative medical therapy were compared and contrasted at length. This discussion was placed in the context of the patient's own specific clinical presentation and past medical history. All of their questions have been addressed.   Following the decision to proceed with transcatheter aortic valve replacement, a discussion was held regarding what types of management strategies would be attempted intraoperatively in the event of life-threatening complications, including whether or not the patient would be considered a candidate for the use of cardiopulmonary bypass and/or conversion to open sternotomy for attempted surgical intervention.  I do not think she is a candidate for emergent sternotomy to manage any intraoperative complications due to her prior coronary bypass surgery and comorbid risk factors.  The patient has been advised of a variety of complications that might develop including but not limited to risks of death, stroke, paravalvular leak, aortic dissection or other major vascular complications, aortic annulus rupture, device embolization, cardiac rupture or perforation, mitral regurgitation, acute myocardial infarction, arrhythmia, heart block or bradycardia requiring permanent pacemaker placement, congestive heart failure, respiratory failure, renal failure, pneumonia, infection, other late complications related to  structural valve deterioration or migration, or other complications that might ultimately cause a temporary or permanent loss of functional independence or other long term morbidity. The patient provides full informed consent for the procedure as described and all questions were answered.      Plan:  She will tentatively be scheduled for transfemoral TAVR using a [employer] Evolut FX valve on 03/04/2023.  She is going to see an oral surgeon in the next week and we will make plans for extraction of her broken off teeth most likely sometime in  August.  I spent 60 minutes performing this consultation and > 50% of this time was spent face to face counseling and coordinating the care of this patient's severe symptomatic aortic stenosis.   Alleen Borne, MD 02/13/2023 4:45 PM

## 2023-02-13 NOTE — Progress Notes (Signed)
Pre Surgical Assessment: 5 M Walk Test  18M=16.59ft  5 Meter Walk Test- trial 1: 11.86 seconds 5 Meter Walk Test- trial 2: 15.28 seconds 5 Meter Walk Test- trial 3: 19.00 seconds 5 Meter Walk Test Average: 21.00 seconds

## 2023-02-18 ENCOUNTER — Other Ambulatory Visit: Payer: Self-pay

## 2023-02-18 DIAGNOSIS — I350 Nonrheumatic aortic (valve) stenosis: Secondary | ICD-10-CM

## 2023-02-26 ENCOUNTER — Telehealth: Payer: Self-pay

## 2023-02-26 NOTE — Telephone Encounter (Signed)
The patient called into the office to make our team aware that she may have covid.  At this time the patient has not performed a test and I advised that this needs to be performed today. The pt states that her grandson went to summer camp and came home sick and then the pt's daughter got sick too.  The patient states that her symptoms started on Sunday. The patient complains of a sore throat, head congestion and cough.  The pt denies fever.  At this time we will await covid results.

## 2023-02-26 NOTE — Telephone Encounter (Signed)
The pt completed at home covid test and results are positive. I spoke with the patient about remaining well hydrated and contacting her PCP to determine if medications should be prescribed. The patient is scheduled for TAVR on 7/30 and the structural heart team will need to determine if the patient can proceed.  The patient has been symptomatic over the past 2 months with worsening DOE, LE edema, chest pain, fullness in abdomen and dizziness.

## 2023-02-27 NOTE — Telephone Encounter (Signed)
Patient is still feeling unwell. I have cancelled her PAT apt for tomorrow and we will move her case out to 8/6.

## 2023-02-28 ENCOUNTER — Encounter (HOSPITAL_COMMUNITY): Admission: RE | Admit: 2023-02-28 | Payer: Medicare Other | Source: Ambulatory Visit

## 2023-02-28 ENCOUNTER — Other Ambulatory Visit: Payer: Self-pay

## 2023-02-28 DIAGNOSIS — I350 Nonrheumatic aortic (valve) stenosis: Secondary | ICD-10-CM

## 2023-03-06 ENCOUNTER — Other Ambulatory Visit: Payer: Self-pay | Admitting: Internal Medicine

## 2023-03-07 ENCOUNTER — Encounter (HOSPITAL_COMMUNITY): Admission: RE | Admit: 2023-03-07 | Payer: Medicare Other | Source: Ambulatory Visit

## 2023-03-07 ENCOUNTER — Ambulatory Visit (HOSPITAL_COMMUNITY)
Admission: RE | Admit: 2023-03-07 | Discharge: 2023-03-07 | Disposition: A | Discharge status: Home / Self Care | Payer: Medicare Other | Source: Ambulatory Visit | Attending: Cardiovascular Disease | Admitting: Cardiovascular Disease

## 2023-03-07 ENCOUNTER — Other Ambulatory Visit: Payer: Self-pay | Admitting: Internal Medicine

## 2023-03-07 ENCOUNTER — Other Ambulatory Visit: Payer: Self-pay

## 2023-03-07 DIAGNOSIS — Z01818 Encounter for other preprocedural examination: Secondary | ICD-10-CM | POA: Diagnosis not present

## 2023-03-07 DIAGNOSIS — E785 Hyperlipidemia, unspecified: Secondary | ICD-10-CM | POA: Diagnosis not present

## 2023-03-07 DIAGNOSIS — Z6841 Body Mass Index (BMI) 40.0 and over, adult: Secondary | ICD-10-CM | POA: Insufficient documentation

## 2023-03-07 DIAGNOSIS — Z8616 Personal history of COVID-19: Secondary | ICD-10-CM | POA: Insufficient documentation

## 2023-03-07 DIAGNOSIS — I350 Nonrheumatic aortic (valve) stenosis: Secondary | ICD-10-CM | POA: Insufficient documentation

## 2023-03-07 DIAGNOSIS — I2510 Atherosclerotic heart disease of native coronary artery without angina pectoris: Secondary | ICD-10-CM | POA: Insufficient documentation

## 2023-03-07 DIAGNOSIS — I5032 Chronic diastolic (congestive) heart failure: Secondary | ICD-10-CM | POA: Diagnosis not present

## 2023-03-07 DIAGNOSIS — J449 Chronic obstructive pulmonary disease, unspecified: Secondary | ICD-10-CM | POA: Insufficient documentation

## 2023-03-07 DIAGNOSIS — E118 Type 2 diabetes mellitus with unspecified complications: Secondary | ICD-10-CM | POA: Diagnosis not present

## 2023-03-07 DIAGNOSIS — R0602 Shortness of breath: Secondary | ICD-10-CM | POA: Diagnosis not present

## 2023-03-07 LAB — COMPREHENSIVE METABOLIC PANEL
ALT: 127 U/L — ABNORMAL HIGH (ref 0–44)
AST: 119 U/L — ABNORMAL HIGH (ref 15–41)
Albumin: 3.4 g/dL — ABNORMAL LOW (ref 3.5–5.0)
Alkaline Phosphatase: 59 U/L (ref 38–126)
Anion gap: 11 (ref 5–15)
BUN: 21 mg/dL (ref 8–23)
CO2: 24 mmol/L (ref 22–32)
Calcium: 9.1 mg/dL (ref 8.9–10.3)
Chloride: 104 mmol/L (ref 98–111)
Creatinine, Ser: 1.2 mg/dL — ABNORMAL HIGH (ref 0.44–1.00)
GFR, Estimated: 46 mL/min — ABNORMAL LOW (ref >60)
Glucose, Bld: 160 mg/dL — ABNORMAL HIGH (ref 70–99)
Potassium: 3.7 mmol/L (ref 3.5–5.1)
Sodium: 139 mmol/L (ref 135–145)
Total Bilirubin: 0.6 mg/dL (ref 0.3–1.2)
Total Protein: 7.1 g/dL (ref 6.5–8.1)

## 2023-03-07 LAB — TYPE AND SCREEN
ABO/RH(D): O NEG
Antibody Screen: NEGATIVE

## 2023-03-07 LAB — URINALYSIS, ROUTINE W REFLEX MICROSCOPIC
Bilirubin Urine: NEGATIVE
Glucose, UA: NEGATIVE mg/dL
Hgb urine dipstick: NEGATIVE
Ketones, ur: NEGATIVE mg/dL
Leukocytes,Ua: NEGATIVE
Nitrite: NEGATIVE
Protein, ur: NEGATIVE mg/dL
Specific Gravity, Urine: 1.017 (ref 1.005–1.030)
pH: 5 (ref 5.0–8.0)

## 2023-03-07 LAB — CBC
HCT: 44.1 % (ref 36.0–46.0)
Hemoglobin: 13.7 g/dL (ref 12.0–15.0)
MCH: 28.3 pg (ref 26.0–34.0)
MCHC: 31.1 g/dL (ref 30.0–36.0)
MCV: 91.1 fL (ref 80.0–100.0)
Platelets: 167 10*3/uL (ref 150–400)
RBC: 4.84 MIL/uL (ref 3.87–5.11)
RDW: 13.5 % (ref 11.5–15.5)
WBC: 5.4 10*3/uL (ref 4.0–10.5)
nRBC: 0 % (ref 0.0–0.2)

## 2023-03-07 LAB — SURGICAL PCR SCREEN
MRSA, PCR: POSITIVE — AB
Staphylococcus aureus: POSITIVE — AB

## 2023-03-07 NOTE — Progress Notes (Signed)
Anesthesia Chart Review:  77 year old female with pertinent history including  CAD s/p CABGx4V (2013 by me), HLD, DMT2, morbid obesity (BMI 42), OSA, COPD, carotid artery disease, breast CA s/p bilateral mastectomies, chronic diastolic CHF, and severe aortic stenosis.  She is scheduled for TAVR on 03/11/2023.  I evaluated patient at her preop visit due to recent COVID diagnosis.  She tested positive at home on 02/26/2023 with symptoms of cough, congestion, fatigue, and shortness of breath.  She reached out to the TAVR team and was prescribed Paxlovid.  She states that overall she is improving but still has productive cough and increased fatigue.  At baseline she has low functional status and significant fatigue due to her severe aortic stenosis.  On exam she is in a wheelchair, well-appearing, in no acute distress.  She did have a mildly productive cough during the examination.  Lung auscultation significant for mild expiratory wheezes and diffuse rhonchi that partially clear with coughing.  Case discussed with anesthesiologist Dr. Maple Hudson.  He advised that we should follow anesthesia guidelines regarding URI which would recommend waiting 4 weeks from time of diagnosis or minimum of 2 weeks after resolution of symptoms before proceeding with elective surgery.  I did reach out to the TAVR team and Dr. Laneta Simmers felt that a significant portion of her symptoms were likely related to underlying severe aortic stenosis and this was not an elective procedure and should proceed as planned.  Per note, "Mrs. Sobek has severe AS with a mean gradient of 53 mm Hg at cath and class 3 symptoms. Unfortunately her case had to be postponed due to Covid but she is a week out from that now and will be 10 days out by Tuesday next week. She is still very short of breath with activity with a normal CXR. I think she needs to have TAVR done on Tuesday 03/11/2023."  Preop labs reviewed, creatinine mildly elevated 1.20, moderate transaminitis  with AST 119 and ALT 127, normal CBC.  CHEST - 2 VIEW 03/07/2023: COMPARISON:  CTA chest 02/04/2023   FINDINGS: Median sternotomy wires are again noted. The heart is enlarged, unchanged. The upper mediastinal contours are normal.   There is no focal consolidation or pulmonary edema. There is no pleural effusion or pneumothorax   There is no acute osseous abnormality.   IMPRESSION: No radiographic evidence of acute cardiopulmonary process.   Zannie Cove [facility]/Anesthesiology Phone [phone] 03/07/2023 4:33 PM'

## 2023-03-07 NOTE — Progress Notes (Addendum)
Patient signed all consents at PAT lab appointment. CHG soap and instructions were given to patient. CHG surgical prep reviewed with patient and all questions answered.  Patients chart send to anesthesia for review.  COVID swab not complete due to recent COVID infection 02/26/23.  Cough evaluated by Antionette Poles, PA-C with anesthesia team.  PT-INR result invalid at pre-op visit. Will need to be recollected DOS. Order placed.

## 2023-03-07 NOTE — Progress Notes (Signed)
Alleen Borne, MD  [name], RN Jamie [name] has severe AS with a mean gradient of 53 mm Hg at cath and class 3 symptoms. Unfortunately her case had to be postponed due to Covid but she is a week out from that now and will be 10 days out by Tuesday next week. She is still very short of breath with activity with a normal CXR. I think she needs to have TAVR done on Tuesday 03/11/2023.  Alleen Borne, MD

## 2023-03-07 NOTE — Progress Notes (Signed)
Julieta Gutting, RN with TAVR team made aware of patients +MRSA and +MSSA

## 2023-03-07 NOTE — Anesthesia Preprocedure Evaluation (Signed)
Anesthesia Evaluation  Patient identified by MRN, date of birth, ID band Patient awake    Reviewed: Allergy & Precautions, NPO status , Patient's Chart, lab work & pertinent test results  Airway Mallampati: II  TM Distance: >3 FB Neck ROM: Full    Dental  (+) Missing   Pulmonary asthma , sleep apnea , COPD,  COPD inhaler, former smoker   Pulmonary exam normal breath sounds clear to auscultation       Cardiovascular hypertension, Pt. on medications and Pt. on home beta blockers + CAD, + Past MI and +CHF  + Valvular Problems/Murmurs AS  Rhythm:Regular Rate:Normal + Systolic murmurs ECHO: 1. Left ventricular ejection fraction, by estimation, is 55 to 60%. The  left ventricle has normal function. The left ventricle has no regional  wall motion abnormalities. Left ventricular diastolic parameters are  consistent with Grade II diastolic  dysfunction (pseudonormalization).   2. Right ventricular systolic function is normal. The right ventricular  size is normal.   3. Left atrial size was mildly dilated.   4. The mitral valve is normal in structure. No evidence of mitral valve  regurgitation.   5. The aortic valve was not well visualized. Aortic valve regurgitation  is not visualized. Moderate aortic valve stenosis. Aortic valve area, by  VTI measures 1.28 cm. Aortic valve mean gradient measures 24.0 mmHg.  Aortic valve Vmax measures 3.38 m/s.   6. The inferior vena cava is normal in size with greater than 50%  respiratory variability, suggesting right atrial pressure of 3 mmHg.     Neuro/Psych   Anxiety     negative neurological ROS     GI/Hepatic negative GI ROS, Neg liver ROS,,,  Endo/Other  diabetes, Oral Hypoglycemic Agents  Morbid obesity  Renal/GU negative Renal ROS     Musculoskeletal negative musculoskeletal ROS (+)    Abdominal  (+) + obese  Peds  Hematology negative hematology ROS (+)   Anesthesia Other  Findings Severe Aortic Stenosis  Reproductive/Obstetrics                             Anesthesia Physical Anesthesia Plan  ASA: 4  Anesthesia Plan: MAC   Post-op Pain Management:    Induction: Intravenous  PONV Risk Score and Plan: 2 and Ondansetron, Dexamethasone and Treatment may vary due to age or medical condition  Airway Management Planned: Simple Face Mask  Additional Equipment:   Intra-op Plan:   Post-operative Plan:   Informed Consent: I have reviewed the patients History and Physical, chart, labs and discussed the procedure including the risks, benefits and alternatives for the proposed anesthesia with the patient or authorized representative who has indicated his/her understanding and acceptance.     Dental advisory given  Plan Discussed with: CRNA  Anesthesia Plan Comments: (PAT note by Antionette Poles, PA-C:  77 year old female with pertinent history including CAD s/p CABGx4V (2013 by me), HLD, DMT2, morbid obesity (BMI 42), OSA, COPD, carotid artery disease, breast CA s/p bilateral mastectomies, chronic diastolic CHF, and severe aortic stenosis.  She is scheduled for TAVR on 03/11/2023.  I evaluated patient at her preop visit due to recent COVID diagnosis.  She tested positive at home on 02/26/2023 with symptoms of cough, congestion, fatigue, and shortness of breath.  She reached out to the TAVR team and was prescribed Paxlovid.  She states that overall she is improving but still has productive cough and increased fatigue.  At baseline she has low  functional status and significant fatigue due to her severe aortic stenosis.  On exam she is in a wheelchair, well-appearing, in no acute distress.  She did have a mildly productive cough during the examination.  Lung auscultation significant for mild expiratory wheezes and diffuse rhonchi that partially clear with coughing.  Case discussed with anesthesiologist Dr. Maple Hudson.  He advised that we should follow  anesthesia guidelines regarding URI which would recommend waiting 4 weeks from time of diagnosis or minimum of 2 weeks after resolution of symptoms before proceeding with elective surgery.  I did reach out to the TAVR team and Dr. Laneta Simmers felt that a significant portion of her symptoms were likely related to underlying severe aortic stenosis and this was not an elective procedure and should proceed as planned.  Per note, "Mrs. Wymore has severe AS with a mean gradient of 53 mm Hg at cath and class 3 symptoms. Unfortunately her case had to be postponed due to Covid but she is a week out from that now and will be 10 days out by Tuesday next week. She is still very short of breath with activity with a normal CXR. I think she needs to have TAVR done on Tuesday 03/11/2023."  Preop labs reviewed, creatinine mildly elevated 1.20, moderate transaminitis with AST 119 and ALT 127, normal CBC.  CHEST - 2 VIEW 03/07/2023: COMPARISON:  CTA chest 02/04/2023  FINDINGS: Median sternotomy wires are again noted. The heart is enlarged, unchanged. The upper mediastinal contours are normal.  There is no focal consolidation or pulmonary edema. There is no pleural effusion or pneumothorax  There is no acute osseous abnormality.  IMPRESSION: No radiographic evidence of acute cardiopulmonary process.  )        Anesthesia Quick Evaluation

## 2023-03-10 MED ORDER — NOREPINEPHRINE 4 MG/250ML-% IV SOLN
0.0000 ug/min | INTRAVENOUS | Status: AC
  Administered 2023-03-11: 2 ug/min via INTRAVENOUS
  Filled 2023-03-10: qty 250

## 2023-03-10 MED ORDER — CEFAZOLIN SODIUM-DEXTROSE 2-4 GM/100ML-% IV SOLN
2.0000 g | INTRAVENOUS | Status: AC
  Administered 2023-03-11: 2 g via INTRAVENOUS
  Filled 2023-03-10: qty 100

## 2023-03-10 MED ORDER — VANCOMYCIN HCL 1500 MG/300ML IV SOLN
1500.0000 mg | Freq: Once | INTRAVENOUS | Status: AC
  Administered 2023-03-11: 1500 mg via INTRAVENOUS
  Filled 2023-03-10 (×3): qty 300

## 2023-03-10 MED ORDER — MAGNESIUM SULFATE 50 % IJ SOLN
40.0000 meq | INTRAMUSCULAR | Status: DC
  Filled 2023-03-10 (×2): qty 9.85

## 2023-03-10 MED ORDER — DEXMEDETOMIDINE HCL IN NACL 400 MCG/100ML IV SOLN
0.1000 ug/kg/h | INTRAVENOUS | Status: AC
  Administered 2023-03-11: 113 ug via INTRAVENOUS
  Administered 2023-03-11: 1 ug/kg/h via INTRAVENOUS
  Filled 2023-03-10: qty 100

## 2023-03-10 MED ORDER — HEPARIN 30,000 UNITS/1000 ML (OHS) CELLSAVER SOLUTION
Status: DC
  Filled 2023-03-10 (×3): qty 1000

## 2023-03-10 MED ORDER — POTASSIUM CHLORIDE 2 MEQ/ML IV SOLN
80.0000 meq | INTRAVENOUS | Status: DC
  Filled 2023-03-10 (×2): qty 40

## 2023-03-10 NOTE — H&P (Signed)
[facility]   [facility]         [address]       [place] [address]             [phone]                      CARDIOTHORACIC SURGERY CONSULTATION REPORT     PCP:  Raynelle Jan., MD   [facility] Cardiologist:  Yvonne Kendall, MD  [facility] Electrophysiologist:  None    Referring MD: Raynelle Jan., MD  Reason for consultation:  Severe aortic stenosis   HPI:    The patient is a 77 y.o. female with a hx of CAD s/p CABGx4V (2013 by me), HLD, DMT2, morbid obesity (BMI 42), OSA, COPD, carotid artery disease, breast CA s/p bilateral mastectomies, chronic diastolic CHF, and severe aortic stenosis who is referred for consideration of TAVR.   She previously suffered an NSTEMI in 2013 with diagnostic catheterization revealing severe multivessel disease, she underwent CABG x4. Stress testing was performed in May 2016 with finding of a small area of inferolateral ischemia. She was medically managed. She underwent repeat catheterization in 2019 in the setting of dyspnea on exertion and this revealed an atretic LIMA to the LAD, patent vein graft to the diagonal with an occluded jump portion of the graft to the OM 2, and a patent vein graft to the RCA. She had a 70% ostial left main and 50% proximal LAD stenosis. FFR was measured through these stenoses, and was normal at 0.82. She was placed on isosorbide therapy. She has also been followed over time for known aortic stenosis. Most recent echo 11/27/22 showed EF 55-60%, G2DD, and moderate AS with a mean gradient of 24 mm hg, peak gradient 45.7 mm hg, AVA 1.28cm2, DVI 0.5, SVI 45.    Seen in the office in May for worsening DOE, LE edema and arm discomfort. Initially PET stress was planned but converted to a heart cath given accelerating symptoms. [facility] 01/06/23 showed stable, multivessel native coronary artery disease with patent vein graft to the distal RCA, patent vein graft  to the D1 with known occlusion of the continuation to the OM 2, and atretic LIMA to the LAD. Overall, coronary anatomy felt to be stable. Filling pressures were elevated with a wedge of 20 and PA 43/20 with normal CO/CI. Additionally, she was also noted to have normal LV function and severe AS with a mean gradient of 56 mmHg and calculated valve area of 0.7 cm. In that setting, lasix increased to 40mg  BID. Metoprolol increased and nitrates reduced.     Seen back in the office by Ward Givens NP on 01/24/23 with worsening symptoms of SOB and LE edema despite increased urine output. Also complained on one episode of nitrate responsive chest pain. Lasix increased to 80mg  BID x3 days then dropped back down to 40mg  BID.  She said that she lost a considerable amount of weight and the lower extremity edema resolved with increase Lasix but since the dose was reduced she has gained the weight back and now has lower extremity edema again. She continues to complain of life limiting dyspnea despite diuresis. Cannot do anything without having to stop multiple times to catch her breath.  Her abdomen still feels tight and she complains of stomach pain and diarrhea which was worse on the 80mg  dose of lasix. Complains of leg pain. She chronically sleeps in  a recliner. She had not anymore recent episodes of chest pain. Occasional dizziness but no syncope. Of note, she had not seen a dentist in several years and recently saw a dentist concerning 5 broken off teeth.  She is post to see an oral surgeon next week about scheduling extraction of those 5 teeth.  She has not had any pain in her teeth or jaw.       Past Medical History:  Diagnosis Date   Anxiety     Aortic stenosis      a. 12/2014 Echo:  EF 60-65%, no RWMA, Gr 1 DD, Ao sclerosis, mild AS, mean 14 mmHg, MAC; b. 10/2020 Echo: EF 60-65%, no rwma, GrII DD, nl RV fxn, mildly dil LA, Mild-mod AS; c. 11/2022 Echo: EF 55-60%, mod AS (AVA 1.28cm^2); d. Cath: Mean gradient ,  AVA 0.7 cm^2.   Asthma     Breast cancer (HCC) 08/2011    lt. breast ca   CAD (coronary artery disease)      a. 12/2011 NSTEMI/Cath: Sev 3VD; b. 12/2011 CABG x4: LIMA-LAD, SVG-RCA, SVG-D1-->OM2; c. 08/2017 Cath: Sev native dzs, LIMA->LAD atretic, VG->D1 ok w/ 100 @ continuation to OM2, VG->dRCA ok-->Med rx; c. 01/2023 Cath: LM 70ost, LAD 50p, LCX nl, RCA 135m, VG->dRCA nl, VG->D1 ok w/ 100% continuation to OM2, LIMA->LAD atretic-->overall unchanged->Med Rx.   Carotid artery disease (HCC)      a.  Pre-CABG Dopplers 5/13: Bilateral 40-59%; b.  Carotid US 6/16: RICA 40-59%, LICA 60-79%, normal subclavians bilaterally;  c. Carotid US 11/16: RICA 40-59%; LICA 40-59%, normal subclavians; d. Carotid US 11/17:  Stable 40-59% bilateral ICA stenoses; e. 07/2017 Carotid U/S: 1-39% bilat ICA stenoses.   Chronic heart failure with preserved ejection fraction (HFpEF) (HCC)      a. 10/2020 Echo: EF 60-65%, GrII DD; b. 11/2022 Echo: EF 55-60%, no rwma, GrII DD.   COPD (chronic obstructive pulmonary disease) (HCC)     DM2 (diabetes mellitus, type 2) (HCC)      metformin   Gallstones      Symptomatic   HLD (hyperlipidemia)     Hx of cardiovascular stress test      Myoview 5/16:  small area of inf-lat ischemia, EF 70%; Low Risk >> try medical Rx   Hx of migraines     Hypertension     Myocardial infarction (HCC)     Obesity     Shortness of breath     Sleep apnea      sleep study2-3 yrs ago lost weight afterward but gained back   UTI (lower urinary tract infection)      freq-bladder implant -removed               Past Surgical History:  Procedure Laterality Date   ABDOMINAL HYSTERECTOMY   1977   APPENDECTOMY       AXILLARY LYMPH NODE DISSECTION   10/18/2011    Procedure: AXILLARY LYMPH NODE DISSECTION;  Surgeon: Robyne Askew, MD;  Location: [facility] OR;  Service: General;  Laterality: Left;   BREAST RECONSTRUCTION   10/18/2011    Procedure: BREAST RECONSTRUCTION;  Surgeon: Wayland Denis, DO;  Location: [facility]  OR;  Service: Plastics;  Laterality: Bilateral;   bilateral breast reconstruction with bilateral tissue expander and placement of flex hd.   CARDIAC CATHETERIZATION       CESAREAN SECTION   04/1976   CHOLECYSTECTOMY N/A 08/17/2018    Procedure: LAPAROSCOPIC CHOLECYSTECTOMY;  Surgeon: Abigail Miyamoto, MD;  Location: [facility] OR;  Service: General;  Laterality: N/A;   COLONOSCOPY WITH PROPOFOL N/A 06/20/2019    Procedure: COLONOSCOPY WITH PROPOFOL;  Surgeon: Jeani Hawking, MD;  Location: [facility] ENDOSCOPY;  Service: Endoscopy;  Laterality: N/A;   CORONARY ARTERY BYPASS GRAFT   12/13/2011    Procedure: CORONARY ARTERY BYPASS GRAFTING (CABG);  Surgeon: Alleen Borne, MD;  Location: [facility] OR;  Service: Open Heart Surgery;  Laterality: N/A;  Times four using endoscopically harvested left greater saphenous vein and left internal mammary artery. Right greater saphenous vein attempted; not appropriate for vein harvest.   CORONARY PRESSURE/FFR STUDY N/A 08/22/2017    Procedure: INTRAVASCULAR PRESSURE WIRE/FFR STUDY;  Surgeon: Yvonne Kendall, MD;  Location: [facility] INVASIVE CV LAB;  Service: Cardiovascular;  Laterality: N/A;   EYE SURGERY        bilateral cataract extraction    LAPAROSCOPIC GASTRIC BANDING   2010   LEFT HEART CATHETERIZATION WITH CORONARY ANGIOGRAM N/A 12/11/2011    Procedure: LEFT HEART CATHETERIZATION WITH CORONARY ANGIOGRAM;  Surgeon: Laurey Morale, MD;  Location: [facility] CATH LAB;  Service: Cardiovascular;  Laterality: N/A;   LESION REMOVAL   04/09/2012    Procedure: LESION REMOVAL;  Surgeon: Wayland Denis, DO;  Location: [facility];  Service: Plastics;  Laterality: Bilateral;   MASTECTOMY W/ SENTINEL NODE BIOPSY   10/18/2011    Procedure: MASTECTOMY WITH SENTINEL LYMPH NODE BIOPSY;  Surgeon: Robyne Askew, MD;  Location: [facility] OR;  Service: General;  Laterality: Bilateral;  bilateral mastectomy and left sentinel node biopsy   NASAL SINUS SURGERY       POLYPECTOMY   06/20/2019    Procedure:  POLYPECTOMY;  Surgeon: Jeani Hawking, MD;  Location: [facility] ENDOSCOPY;  Service: Endoscopy;;   PORT-A-CATH REMOVAL   04/09/2012    Procedure: REMOVAL PORT-A-CATH;  Surgeon: Robyne Askew, MD;  Location: [facility];  Service: General;  Laterality: Right;   PORTACATH PLACEMENT   12/02/2011    Procedure: INSERTION PORT-A-CATH;  Surgeon: Robyne Askew, MD;  Location: [facility] OR;  Service: General;  Laterality: Right;   RIGHT/LEFT HEART CATH AND CORONARY/GRAFT ANGIOGRAPHY N/A 08/22/2017    Procedure: RIGHT/LEFT HEART CATH AND CORONARY/GRAFT ANGIOGRAPHY;  Surgeon: Yvonne Kendall, MD;  Location: [facility] INVAS[facility] CV LAB;  Service: Cardiovascular;  Laterality: N/A;   RIGHT/LEFT HEART CATH AND CORONARY/GRAFT ANGIOGRAPHY N/A 01/06/2023    Procedure: RIGHT/LEFT HEART CATH AND CORONARY/GRAFT ANGIOGRAPHY;  Surgeon: Yvonne Kendall, MD;  Location: [facility]VAS[facility] CV LAB;  Service: Cardiovascular;  Laterality: N/A;   TONSILLECTOMY       US ECHOCARDIOGRAPHY        at [facility]   VIDEO BRONCHOSCOPY WITH ENDOBRONCHIAL NAVIGATION Left 11/26/2013    Procedure: VIDEO BRONCHOSCOPY WITH ENDOBRONCHIAL NAVIGATION;  Surgeon: Leslye Peer, MD;  Location: [facility] OR;  Service: Thoracic;  Laterality: Left;               Family History  Problem Relation Age of Onset   Heart disease Mother     Heart disease Father     Breast cancer Maternal Aunt     Ovarian cancer Paternal Aunt     Breast cancer Maternal Aunt     Anesthesia problems Neg Hx     Hypotension Neg Hx     Malignant hyperthermia Neg Hx     Pseudochol deficiency Neg Hx            Social History         Socioeconomic History  Marital status: Married      Spouse name: Not on file   Number of children: 6   Years of education: Not on file   Highest education level: Not on file  Occupational History   Occupation: retired  Tobacco Use   Smoking status: Former      Current packs/day: 0.00      Average packs/day: 2.0 packs/day for 40.0 years (80.0  ttl pk-yrs)      Types: Cigarettes      Start date: 08/25/1966      Quit date: 08/25/2006      Years since quitting: 16.4   Smokeless tobacco: Never  Vaping Use   Vaping status: Never Used  Substance and Sexual Activity   Alcohol use: Yes      Comment: 1-2 times a year   Drug use: No   Sexual activity: Not Currently  Other Topics Concern   Not on file  Social History Narrative   Not on file    Social Determinants of Health        Financial Resource Strain: Not on file  Food Insecurity: Low Risk  (10/11/2022)    [facility]    Food vital sign     Within the past 12 months, you worried that your food would run out before you got money to buy more: Never true     Within the past 12 months, the food you bought just didn't last and you didn't have money to get more. : Never true  Transportation Needs: Not on file (10/11/2022)  Physical Activity: Not on file  Stress: Not on file  Social Connections: Not on file  Intimate Partner Violence: Not on file             Prior to Admission medications   Medication Sig Start Date End Date Taking? Authorizing Provider  albuterol (PROVENTIL) (2.5 MG/3ML) 0.083% nebulizer solution Inhale 3 mLs into the lungs every 6 (six) hours as needed for wheezing or shortness of breath. 02/19/18   Yes [provider]  albuterol (VENTOLIN HFA) 108 (90 Base) MCG/ACT inhaler Inhale 1-2 puffs into the lungs every 6 (six) hours as needed for wheezing or shortness of breath.     Yes [provider]  aspirin EC 81 MG tablet Take 1 tablet (81 mg total) by mouth daily. Patient taking differently: Take 81 mg by mouth every evening. 04/26/16   Yes Weaver, Scott T, PA-C  atorvastatin (LIPITOR) 80 MG tablet TAKE 1 TABLET BY MOUTH ONCE DAILY AT 6 PM 12/12/22   Yes End, Cristal Deer, MD  cephALEXin (KEFLEX) 500 MG capsule Take 500 mg by mouth in the morning. 03/05/17   Yes [provider]  Cholecalciferol (VITAMIN D3) 1000  units CAPS Take 1,000 Units by mouth in the morning.     Yes [provider]  ezetimibe (ZETIA) 10 MG tablet Take 10 mg by mouth in the morning.     Yes [provider]  fluticasone (FLONASE) 50 MCG/ACT nasal spray Place 2 sprays into both nostrils daily as needed for allergies. 08/14/13   Yes [provider]  furosemide (LASIX) 40 MG tablet Take 1 tablet (40 mg total) by mouth 2 (two) times daily. 01/06/23   Yes End, Cristal Deer, MD  isosorbide mononitrate (IMDUR) 60 MG 24 hr tablet Take 1 tablet (60 mg total) by mouth daily. 01/06/23   Yes End, Cristal Deer, MD  lisinopril (ZESTRIL) 5 MG tablet Take 1 tablet by mouth once  daily 01/21/23   Yes Hammock, Sheri, NP  loratadine (CLARITIN) 10 MG tablet Take 10 mg by mouth every evening.     Yes [provider]  metFORMIN (GLUCOPHAGE) 500 MG tablet Take 1 tablet (500 mg total) by mouth every evening. 01/09/23   Yes End, Cristal Deer, MD  metoprolol tartrate (LOPRESSOR) 50 MG tablet Take 1 tablet (50 mg total) by mouth 2 (two) times daily. 01/06/23   Yes End, Cristal Deer, MD  Multiple Vitamin (MULTI VITAMIN DAILY PO) Take 1 tablet by mouth in the morning.     Yes [provider]  nitroGLYCERIN (NITROSTAT) 0.4 MG SL tablet Place 1 tablet (0.4 mg total) under the tongue every 5 (five) minutes as needed. 06/22/18   Yes End, Cristal Deer, MD  Polyethyl Glycol-Propyl Glycol 0.4-0.3 % SOLN Place 1 drop into both eyes 4 (four) times daily as needed (for dry eyes).      Yes [provider]  potassium chloride 20 MEQ TBCR Take 1 tablet (20 mEq total) by mouth daily. 01/24/23 04/24/23 Yes Creig Hines, NP            Current Outpatient Medications  Medication Sig Dispense Refill   albuterol (PROVENTIL) (2.5 MG/3ML) 0.083% nebulizer solution Inhale 3 mLs into the lungs every 6 (six) hours as needed for wheezing or shortness of breath.   2   albuterol (VENTOLIN HFA) 108 (90 Base) MCG/ACT inhaler Inhale 1-2 puffs  into the lungs every 6 (six) hours as needed for wheezing or shortness of breath.       aspirin EC 81 MG tablet Take 1 tablet (81 mg total) by mouth daily. (Patient taking differently: Take 81 mg by mouth every evening.)       atorvastatin (LIPITOR) 80 MG tablet TAKE 1 TABLET BY MOUTH ONCE DAILY AT 6 PM 90 tablet 0   cephALEXin (KEFLEX) 500 MG capsule Take 500 mg by mouth in the morning.       Cholecalciferol (VITAMIN D3) 1000 units CAPS Take 1,000 Units by mouth in the morning.       ezetimibe (ZETIA) 10 MG tablet Take 10 mg by mouth in the morning.       fluticasone (FLONASE) 50 MCG/ACT nasal spray Place 2 sprays into both nostrils daily as needed for allergies.       furosemide (LASIX) 40 MG tablet Take 1 tablet (40 mg total) by mouth 2 (two) times daily. 60 tablet 5   isosorbide mononitrate (IMDUR) 60 MG 24 hr tablet Take 1 tablet (60 mg total) by mouth daily. 180 tablet 0   lisinopril (ZESTRIL) 5 MG tablet Take 1 tablet by mouth once daily 90 tablet 3   loratadine (CLARITIN) 10 MG tablet Take 10 mg by mouth every evening.       metFORMIN (GLUCOPHAGE) 500 MG tablet Take 1 tablet (500 mg total) by mouth every evening.       metoprolol tartrate (LOPRESSOR) 50 MG tablet Take 1 tablet (50 mg total) by mouth 2 (two) times daily. 60 tablet 5   Multiple Vitamin (MULTI VITAMIN DAILY PO) Take 1 tablet by mouth in the morning.       nitroGLYCERIN (NITROSTAT) 0.4 MG SL tablet Place 1 tablet (0.4 mg total) under the tongue every 5 (five) minutes as needed. 25 tablet 3   Polyethyl Glycol-Propyl Glycol 0.4-0.3 % SOLN Place 1 drop into both eyes 4 (four) times daily as needed (for dry eyes).        potassium chloride 20 MEQ TBCR  Take 1 tablet (20 mEq total) by mouth daily. 30 tablet 3      No current facility-administered medications for this visit.        Allergies       Allergies  Allergen Reactions   Penicillins Nausea Only and Other (See Comments)      DID THE REACTION INVOLVE: Swelling of the  face/tongue/throat, SOB, or low BP? No Sudden or severe rash/hives, skin peeling, or the inside of the mouth or nose? No Did it require medical treatment? #  #  #  YES  #  #  #  When did it last happen? Unknown     Vicodin [Hydrocodone-Acetaminophen] Other (See Comments)      Syncope     Duloxetine Hcl Nausea Only   Sulfa Antibiotics Rash   Sulfamethoxazole Rash            Review of Systems:               General:                      + decreased appetite, + decreased energy, + weight gain, no weight loss, no fever             Cardiac:                       no chest pain with exertion, no chest pain at rest, +SOB with minimal exertion, no resting SOB, no PND, + orthopnea, no palpitations, no arrhythmia, no atrial fibrillation, + LE edema, no dizzy spells, no syncope             Respiratory:                 + exertional shortness of breath, no home oxygen, no productive cough, no dry cough, no bronchitis, no wheezing, no hemoptysis, no asthma, no pain with inspiration or cough, + sleep apnea, no CPAP at night             GI:                               no difficulty swallowing, no reflux, no frequent heartburn, no hiatal hernia, + abdominal pain, no constipation, + diarrhea, no hematochezia, no hematemesis, no melena             GU:                              no dysuria,  + frequency, no urinary tract infection, no hematuria, no kidney stones, no kidney disease             Vascular:                     no pain suggestive of claudication, no pain in feet, no leg cramps, no varicose veins, no DVT, no non-healing foot ulcer             Neuro:                         no stroke, no TIA's, no seizures, no headaches, no temporary blindness one eye,  no slurred speech, no peripheral neuropathy, no chronic pain, no instability of gait, no memory/cognitive dysfunction  Musculoskeletal:         + arthritis, bi joint swelling, bi myalgias, + difficulty walking, + decreased mobility               Skin:                            no rash, no itching, no skin infections, no pressure sores or ulcerations             Psych:                         no anxiety, + depression, no nervousness, no unusual recent stress             Eyes:                           no blurry vision, + floaters, no recent vision changes, + wears glasses              ENT:                            + hearing loss, no loose or painful teeth, no dentures, last saw dentist 4 days ago.             Hematologic:               + easy bruising, no abnormal bleeding, no clotting disorder, no frequent epistaxis             Endocrine:                   + diabetes, does not check CBG's at home                            Physical Exam:               BP (!) 150/52   Pulse 62   Resp 20   Ht 5\' 5"  (1.651 m)   Wt 249 lb (112.9 kg)   SpO2 95% Comment: RA  BMI 41.44 kg/m              General:                      Obese woman, well-appearing             HEENT:                       Unremarkable, NCAT, PERLA, EOMI             Neck:                           no JVD, + bruit or transmitted murmur to both sides of neck, no adenopathy              Chest:                          clear to auscultation, symmetrical breath sounds, no wheezes, no rhonchi              CV:  RRR, 3/6 systolic murmur RSB, no diastolic murmur             Abdomen:                    soft, non-tender, no masses              Extremities:                 warm, well-perfused, pulses palpable at ankle, mild lower extremity edema             Rectal/GU                   Deferred             Neuro:                         Grossly non-focal and symmetrical throughout             Skin:                            Clean and dry, no rashes, no breakdown   Diagnostic Tests:   Lab Results: Recent Labs (last 2 labs)  No results for input(s): "WBC", "HGB", "HCT", "PLT" in the last 72 hours.   BMET:  Recent Labs (last 2 labs)     Recent  Labs    02/12/23 1245  NA 144  K 4.2  CL 102  CO2 27  GLUCOSE 158*  BUN 19  CREATININE 1.32*  CALCIUM 9.5      ECHOCARDIOGRAM REPORT       Patient Name:   Jamie [name] Date of Exam: 11/27/2022  Medical Rec #:  [id number]     Height:       65.0 in  Accession #:    [id number]    Weight:       255.0 lb  Date of Birth:  06/24/1946     BSA:          2.193 m  Patient Age:    10 years      BP:           154/72 mmHg  Patient Gender: F             HR:           62 bpm.  Exam Location:  [facility]   Procedure: 2D Echo and Intracardiac Opacification Agent   Indications:    I35.0 Nonrheumatic aortic (valve) stenosis    History:        Patient has prior history of Echocardiogram examinations,  most                 recent 10/23/2021. CHF, CAD and Previous Myocardial  Infarction,                 Prior CABG, COPD, Aortic Valve Disease,  Signs/Symptoms:Dyspnea                 and Fatigue; Risk Factors:Sleep Apnea, Former Smoker,                  Hypertension and Diabetes.    Sonographer:    Quentin Ore RDMS, RVT, RDCS  Referring Phys: [id number] CHRISTOPHER RONALD BERGE   IMPRESSIONS     1. Left ventricular ejection fraction, by estimation, is 55 to 60%. The  left ventricle has normal function. The left ventricle  has no regional  wall motion abnormalities. Left ventricular diastolic parameters are  consistent with Grade II diastolic  dysfunction (pseudonormalization).   2. Right ventricular systolic function is normal. The right ventricular  size is normal.   3. Left atrial size was mildly dilated.   4. The mitral valve is normal in structure. No evidence of mitral valve  regurgitation.   5. The aortic valve was not well visualized. Aortic valve regurgitation  is not visualized. Moderate aortic valve stenosis. Aortic valve area, by  VTI measures 1.28 cm. Aortic valve mean gradient measures 24.0 mmHg.  Aortic valve Vmax measures 3.38 m/s.   6. The inferior vena cava is normal in size  with greater than 50%  respiratory variability, suggesting right atrial pressure of 3 mmHg.   Comparison(s): Previous AV meas reported as 35max/20mean PG.   FINDINGS   Left Ventricle: Left ventricular ejection fraction, by estimation, is 55  to 60%. The left ventricle has normal function. The left ventricle has no  regional wall motion abnormalities. Definity contrast agent was given IV  to delineate the left ventricular   endocardial borders. The left ventricular internal cavity size was normal  in size. There is no left ventricular hypertrophy. Left ventricular  diastolic parameters are consistent with Grade II diastolic dysfunction  (pseudonormalization).   Right Ventricle: The right ventricular size is normal. No increase in  right ventricular wall thickness. Right ventricular systolic function is  normal.   Left Atrium: Left atrial size was mildly dilated.   Right Atrium: Right atrial size was not well visualized.   Pericardium: There is no evidence of pericardial effusion.   Mitral Valve: The mitral valve is normal in structure. No evidence of  mitral valve regurgitation.   Tricuspid Valve: The tricuspid valve is normal in structure. Tricuspid  valve regurgitation is not demonstrated.   Aortic Valve: The aortic valve was not well visualized. Aortic valve  regurgitation is not visualized. Moderate aortic stenosis is present.  Aortic valve mean gradient measures 24.0 mmHg. Aortic valve peak gradient  measures 45.7 mmHg. Aortic valve area, by   VTI measures 1.28 cm.   Pulmonic Valve: The pulmonic valve was not well visualized. Pulmonic valve  regurgitation is not visualized.   Aorta: The aortic root is normal in size and structure.   Venous: The inferior vena cava is normal in size with greater than 50%  respiratory variability, suggesting right atrial pressure of 3 mmHg.   IAS/Shunts: No atrial level shunt detected by color flow Doppler.     LEFT VENTRICLE  PLAX  2D  LVIDd:         5.10 cm   Diastology  LVIDs:         3.80 cm   LV e' medial:    5.22 cm/s  LV PW:         0.90 cm   LV E/e' medial:  36.0  LV IVS:        0.90 cm   LV e' lateral:   6.64 cm/s  LVOT diam:     1.80 cm   LV E/e' lateral: 28.3  LV SV:         99  LV SV Index:   45  LVOT Area:     2.54 cm     RIGHT VENTRICLE            IVC  RV S prime:     9.14 cm/s  IVC diam: 1.60 cm  TAPSE (M-mode): 3.1  cm   LEFT ATRIUM           Index  LA diam:      4.40 cm 2.01 cm/m  LA Vol (A4C): 83.9 ml 38.25 ml/m   AORTIC VALVE                     PULMONIC VALVE  AV Area (Vmax):    1.10 cm      PV Vmax:       1.52 m/s  AV Area (Vmean):   1.14 cm      PV Peak grad:  9.3 mmHg  AV Area (VTI):     1.28 cm  AV Vmax:           338.00 cm/s  AV Vmean:          234.000 cm/s  AV VTI:            0.772 m  AV Peak Grad:      45.7 mmHg  AV Mean Grad:      24.0 mmHg  LVOT Vmax:         146.00 cm/s  LVOT Vmean:        105.000 cm/s  LVOT VTI:          0.389 m  LVOT/AV VTI ratio: 0.50    AORTA  Ao Root diam: 3.20 cm  Ao Asc diam:  2.50 cm   MITRAL VALVE  MV Area (PHT): 2.39 cm     SHUNTS  MV Decel Time: 317 msec     Systemic VTI:  0.39 m  MV E velocity: 188.00 cm/s  Systemic Diam: 1.80 cm  MV A velocity: 103.00 cm/s  MV E/A ratio:  1.83   Debbe Odea MD  Electronically signed by Debbe Odea MD  Signature Date/Time: 11/27/2022/2:51:07 PM        Final        Physicians   Panel Physicians Referring Physician Case Authorizing Physician  End, Cristal Deer, MD (Primary)        Procedures   RIGHT/LEFT HEART CATH AND CORONARY/GRAFT ANGIOGRAPHY    Conclusion   Conclusions: Severe ostial LMCA disease and chronic total occlusion of mid RCA.  There is also moderate disease of mid LAD.  Overall appearance is similar to prior catheterization in 2019. Widely patent SVG-D1 with chronic total occlusion of sequential portion to OM. Widely patent SVG-RCA. Small LIMA-LAD that has  partial retrograde filling from the LAD (known to be atretic by prior cath). Mildly elevated left and right heart filling pressures. Normal Fick cardiac output/index. Severe aortic valve stenosis (cannot exclude some element of subvalvular/LVOT gradient).   Recommendations: Increase diuresis. Increase metoprolol and reduce isosorbide mononitrate. Aggressive secondary prevention of coronary artery disease. Consider referral to Dr. McAlhany/structural heart team for further evaluation/management of aortic stenosis if symptoms do not improve at follow-up.   Yvonne Kendall, MD [facility]     Recommendations       Antiplatelet/Anticoag Continue indefinite aspirin 81 mg daily.  Discharge Date In the absence of any other complications or medical issues, we expect the patient to be ready for discharge from a cath perspective on 01/06/2023.    Surgeon Notes       01/06/2023  9:29 AM Brief Op Note signed by Yvonne Kendall, MD    Indications   Dyspnea on exertion [R06.09 (ICD-10-CM)]  Nonrheumatic aortic valve stenosis [I35.0 (ICD-10-CM)]  Coronary artery disease of native artery of native heart with stable angina pectoris (HCC) [I25.118 (  ICD-10-CM)]    Clinical Presentation   CHF/Shock Congestive heart failure present. NYHA Class III. No shock present.    Procedural Details   Technical Details Indication: 77 y.o. year-old woman with history of coronary artery disease status post CABG (2013), diastolic heart failure, mild to moderate aortic stenosis, hyperlipidemia, carotid artery stenosis, breast cancer status post bilateral mastectomies, diabetes, COPD, sleep apnea, and obesity, presenting for evaluation of progressive dyspnea on exertion.  GFR: 51 ml/min  Procedure: The risks, benefits, complications, treatment options, and expected outcomes were discussed with the patient. The patient and/or family concurred with the proposed plan, giving informed consent. Right arm access was  used given history of left breast cancer and known atresia of LIMA-LAD.  The right wrist and elbow were prepped and draped in a sterile fashion. 1% lidocaine was used for local anesthesia. Ultrasound was used to evaluate the right basilic vein. It was patent.  A micropuncture needle was used to access the right basilic vein under ultrasound guidance. A 64F slender Glidesheath was inserted using modified Seldinger technique. Right heart catheterization was performed by advancing a 64F balloon-tipped catheter through the right heart chambers into the pulmonary capillary wedge position. Pressure measurements and oxygen saturations were obtained.  Ultrasound was used to evaluate the right radial artery. It was patent.  A micropuncture needle was used to access the right radial artery under ultrasound guidance. Using the modified Seldinger access technique, a 49F slender Glidesheath was placed in the right radial artery. 3 mg Verapamil was given through the sheath. Heparin 5,000 units were administered.  Selective coronary angiography was performed using a 64F JL3.5 catheter to engage the left coronary artery and a 64F JR4 catheter to engage the right coronary artery. Selective bypass graft angiography was performed using 64F JR4 to engage the SVG-D1-OM and 64F JL3.5 to engage the SVG-RCA.  LIMA-LAD was not selectively engaged as it is known to be atretic.  Left heart catheterization was performed using a 64F JR catheter. Left ventriculogram was performed with a hand injection of contrast. The JR4 was exchanged for a 49F dual lumen pigtail catheter to allow for simultaneous LV and aortic pressure measurement.  At the end of the procedure, the radial artery sheath was removed and a Zephyr band applied to achieve patent hemostasis. The basilic vein sheath was removed and hemostasis achieved with manual compression. There were no immediate complications. The patient was taken to the recovery area in stable  condition.   Estimated blood loss <50 mL.   During this procedure medications were administered to achieve and maintain moderate conscious sedation while the patient's heart rate, blood pressure, and oxygen saturation were continuously monitored and I was present face-to-face 100% of this time.    Medications (Filter: Administrations occurring from 0739 to 0923 on 01/06/23)  important  Continuous medications are totaled by the amount administered until 01/06/23 0923.    fentaNYL (SUBLIMAZE) injection (mcg)  Total dose: 25 mcg Date/Time Rate/Dose/Volume Action    01/06/23 0803 25 mcg Given    midazolam (VERSED) injection (mg)  Total dose: 1 mg Date/Time Rate/Dose/Volume Action    01/06/23 0803 1 mg Given    Heparin (Porcine) in NaCl 1000-0.9 UT/500ML-% SOLN (mL)  Total volume: 1,000 mL Date/Time Rate/Dose/Volume Action    01/06/23 0806 500 mL Given    0806 500 mL Given    lidocaine (PF) (XYLOCAINE) 1 % injection (mL)  Total volume: 4 mL Date/Time Rate/Dose/Volume Action    01/06/23 0806 2 mL Given  0806 2 mL Given    Radial Cocktail/Verapamil only (mL)  Total volume: 10 mL Date/Time Rate/Dose/Volume Action    01/06/23 0817 10 mL Given    heparin sodium (porcine) injection (Units)  Total dose: 5,000 Units Date/Time Rate/Dose/Volume Action    01/06/23 0819 5,000 Units Given    Radial Cocktail/Verapamil only (mL)  Total volume: 10 mL Date/Time Rate/Dose/Volume Action    01/06/23 0904 10 mL Given    iohexol (OMNIPAQUE) 350 MG/ML injection (mL)  Total volume: 95 mL Date/Time Rate/Dose/Volume Action    01/06/23 0906 95 mL Given      Sedation Time   Sedation Time Physician-1: 1 hour 1 minute 50 seconds Contrast        Administrations occurring from 0739 to 0923 on 01/06/23:  Medication Name Total Dose  iohexol (OMNIPAQUE) 350 MG/ML injection 95 mL    Radiation/Fluoro   Fluoro time: 14.3 (min) DAP: 35.2 (Gycm2) Cumulative Air Kerma: 742.1 (mGy) Complications    Complications documented before study signed (01/06/2023  1:28 PM)    No complications were associated with this study.  Documented by Mila Palmer, RN - 01/06/2023  9:15 AM      Coronary Findings   Diagnostic Dominance: Right Left Main  Vessel is large.  Ost LM lesion is 70% stenosed.    Left Anterior Descending  Vessel is large.  Prox LAD lesion is 50% stenosed.    First Diagonal Branch  Vessel is moderate in size.    Second Diagonal Branch  Vessel is small in size.    Left Circumflex  Vessel is moderate in size. Vessel is angiographically normal.    First Obtuse Marginal Branch  Vessel is small in size.    Second Obtuse Marginal Branch  Vessel is moderate in size.    Right Coronary Artery  Vessel is moderate in size.  Mid RCA lesion is 100% stenosed. The lesion is chronically occluded.    Right Posterior Descending Artery  Vessel is moderate in size. Vessel is angiographically normal.    Right Posterior Atrioventricular Artery  Vessel is large in size. Vessel is angiographically normal.    Saphenous Graft To Dist RCA  SVG and is large. The graft exhibits no disease.    Sequential Saphenous Graft To 1st Diag, 2nd Mrg  SVG and is large.  Origin to Insertion lesion between 1st Diag and 2nd Mrg is 100% stenosed. The lesion is chronically occluded.    LIMA LIMA Graft To Mid LAD  LIMA and is small. The graft is atretic. There is competitive flow.    Intervention    No interventions have been documented.    Right Heart   Right Heart Pressures RA (mean): 9 mmHg RV (S/EDP): 47/10 mmHg PA (S/D, mean): 43/20 (28) mmHg PCWP (mean): 20 mmHg  Ao sat: 96% PA sat: 70%  Fick CO: 5.8 L/min Fick CI: 2.7 L/min/m^2  PVR: 1.4 Wood units    Wall Motion       The following segments are hyperkinetic: mid anterior, mid inferior, basilar anterior, basilar inferior, apical anterior and apical inferior.             Left Heart   Left Ventricle The left  ventricular size is normal. There is hyperdynamic left ventricular systolic function. LV end diastolic pressure is mildly elevated. LVEDP 18 mmHg. The left ventricular ejection fraction is greater than 65% by visual estimate. No regional wall motion abnormalities.  Aortic Valve There is severe aortic valve stenosis. An element of  subvalvular/LVOT gradient may be contributing.  Mean gradient 56 mmHg.  Calculated valve area 0.7 cm^2.    Coronary Diagrams   Diagnostic Dominance: Right  Intervention    Implants    No implant documentation for this case.    Syngo Images    Show images for CARDIAC CATHETERIZATION Images on Long Term Storage    Show images for [place], [place] V Link to Procedure Log   Procedure Log    Link to Procedure Log   Procedure Log    Hemo Data   Flowsheet Row Most Recent Value  Fick Cardiac Output 5.75 L/min  Fick Cardiac Output Index 2.65 (L/min)/BSA  Aortic Mean Gradient 53.15 mmHg  Aortic Peak Gradient 72.2 mmHg  Aortic Valve Area 0.65  Aortic Value Area Index 0.3 cm2/BSA  RA A Wave 7 mmHg  RA V Wave 7 mmHg  RA Mean 5 mmHg  RV Systolic Pressure 37 mmHg  RV Diastolic Pressure 0 mmHg  RV EDP 8 mmHg  PA Systolic Pressure 35 mmHg  PA Diastolic Pressure 11 mmHg  PA Mean 23 mmHg  PW A Wave 17 mmHg  PW V Wave 16 mmHg  PW Mean 12 mmHg  AO Systolic Pressure 116 mmHg  AO Diastolic Pressure 63 mmHg  AO Mean 73 mmHg  LV Systolic Pressure 172 mmHg  LV Diastolic Pressure 9 mmHg  LV EDP 22 mmHg  AOp Systolic Pressure 129 mmHg  AOp Diastolic Pressure 58 mmHg  AOp Mean Pressure 89 mmHg  LVp Systolic Pressure 188 mmHg  LVp Diastolic Pressure 3 mmHg  LVp EDP Pressure 18 mmHg  QP/QS 1  TPVR Index 8.68 HRUI    ADDENDUM REPORT: 02/07/2023 17:16   CLINICAL DATA:  Aortic valve replacement (TAVR), pre-op eval   EXAM: Cardiac TAVR CT   TECHNIQUE: The patient was scanned on a Siemens Force 192 slice scanner. A 120 kV retrospective scan was triggered in  the descending thoracic aorta at 111 HU's. Gantry rotation speed was 270 msecs and collimation was .9 mm. The 3D data set was reconstructed in 5% intervals of the R-R cycle. Systolic and diastolic phases were analyzed on a dedicated work station using MPR, MIP and VRT modes. The patient received OMNIPAQUE IOHEXOL 350 MG/ML SOLN of contrast.   FINDINGS: Aortic Valve:   Tricuspid aortic valve with severely reduced cusp excursion. Severely thickened and severely calcified aortic valve cusps.   AV calcium score: 1206   Virtual Basal Annulus Measurements: 25%   Maximum/Minimum Diameter: 22.1 x 18.8 mm   Perimeter: 64.1 mm   Area:  320 mm2   LVOT calcification below RCC/LCC commissure.   Membranous septal length: 5.5 mm   Based on these measurements, the annulus would be suitable for a 20 mm Sapien 3 valve. Alternatively, Heart Team can consider 26 mm Evolut valve. Recommend Heart Team discussion for valve selection.   Sinus of Valsalva Measurements:   Non-coronary:  28 mm   Right - coronary:  29 mm   Left - coronary:  29 mm   Sinus of Valsalva Height:   Left: 19.5 mm   Right: 19.9 mm   Aorta: Conventional 3 vessel branch pattern of aortic arch. Severe aortic atherosclerosis, and atherosclerosis of the proximal left subclavian.   Sinotubular Junction:  23 mm   Ascending Thoracic Aorta:  27 mm   Aortic Arch:  23 mm   Coronary Artery Height above Annulus:   Left main: 12.2 mm   Right coronary: 16.5 mm   Coronary  Arteries: Normal coronary origin. Right dominance. The study was performed without use of NTG and insufficient for plaque evaluation. Prior CABG, see coronary angiography 01/06/23 for details of anatomy.   Optimum Fluoroscopic Angle for Delivery: RAO 5 CRA 8   OTHER:   Left atrial appendage: No thrombus.   Mitral valve: Degenerative with moderate mitral annular calcifications.   Pulmonary artery: Dilated branch PAs.   Pulmonary veins:  Normal anatomy.   IMPRESSION: 1. Tricuspid aortic valve with severely reduced cusp excursion. Severely thickened and severely calcified aortic valve cusps. 2. Aortic valve calcium score: 1206 3. Annulus area: 320 mm2, suitable for 20 mm Sapien 3 valve vs 26 mm Evolut valve. LVOT calcifications below RCC/LCC commissure. Membranous septal length 5.5 mm. Left ventricular hypertrophy narrows the LVOT. 4. Sufficient coronary artery heights from annulus. 5. Optimum fluoroscopic angle for delivery: RAO 5 CRA 8     Electronically Signed   By: Weston Brass M.D.   On: 02/07/2023 17:16        EXAM: CT ANGIOGRAPHY CHEST, ABDOMEN AND PELVIS   TECHNIQUE: Non-contrast CT of the chest was initially obtained.   Multidetector CT imaging through the chest, abdomen and pelvis was performed using the standard protocol during bolus administration of intravenous contrast. Multiplanar reconstructed images and MIPs were obtained and reviewed to evaluate the vascular anatomy.   RADIATION DOSE REDUCTION: This exam was performed according to the departmental dose-optimization program which includes automated exposure control, adjustment of the mA and/or kV according to patient size and/or use of iterative reconstruction technique.   CONTRAST:  OMNIPAQUE IOHEXOL 350 MG/ML SOLN   COMPARISON:  Chest CT dated March 30, 2018   FINDINGS: CTA CHEST FINDINGS   Cardiovascular: Mild cardiomegaly. No pericardial effusion. Aortic valve thickening and calcifications. Normal caliber thoracic aorta with moderate atherosclerotic disease. Severe coronary artery calcifications status post CABG. Severe narrowing of the proximal left subclavian artery due to calcified plaque.   Mediastinum/Nodes: Small hiatal hernia. No enlarged lymph nodes seen in the chest.   Lungs/Pleura: Central airways are patent. Left upper lobe linear opacities unchanged when multiple prior exams and likely due to scarring.  No consolidation, pleural effusion or pneumothorax. Stable small solid pulmonary nodule of the left upper lobe measuring 5 mm on series 10, image 83, considered given greater than 1 year stability benign with no further follow-up imaging necessary.   Musculoskeletal: No chest wall abnormality. No acute or significant osseous findings.z   CTA ABDOMEN AND PELVIS FINDINGS   Hepatobiliary: Hepatic steatosis. No suspicious focal liver lesions. Prior cholecystectomy. No biliary ductal dilation.   Pancreas: Unremarkable. No pancreatic ductal dilatation or surrounding inflammatory changes.   Spleen: Normal in size without focal abnormality.   Adrenals/Urinary Tract: Bilateral adrenal glands are unremarkable. No hydronephrosis or nephrolithiasis. Indeterminate exophytic lesion of the lower pole the right kidney measuring 9 mm on series 9 image 163. Additional bilateral low-attenuation renal lesions which are consistent with simple cysts. Bladder is unremarkable.   Stomach/Bowel: Lap band device in place. No evidence of bowel wall thickening, distention, or inflammatory changes.   Vascular/lymphatic: Normal caliber abdominal aorta with severe atherosclerotic disease. No enlarged lymph nodes seen in the abdomen or pelvis.   Reproductive: No adnexal mass.   Other: No abdominal wall hernia or abnormality. No abdominopelvic ascites.   Musculoskeletal: No acute or significant osseous findings.   VASCULAR MEASUREMENTS PERTINENT TO TAVR:   AORTA:   Minimal Aortic Diameter-7.7 mm   Severity of Aortic Calcification-severe   RIGHT  PELVIS:   Right Common Iliac Artery -   Minimal Diameter-5.3 mm   Tortuosity-none   Calcification-moderate   Right External Iliac Artery -   Minimal Diameter-6.2 mm   Tortuosity-moderate   Calcification-mild   Right Common Femoral Artery -   Minimal Diameter-3.4 mm   Tortuosity-none   Calcification-severe   LEFT PELVIS:   Left Common  Iliac Artery -   Minimal Diameter-5.0 mm   Tortuosity-mild   Calcification-severe   Left External Iliac Artery -   Minimal Diameter-6.7 mm   Tortuosity-moderate   Calcification-none   Left Common Femoral Artery -   Minimal Diameter-4.2 mm   Tortuosity-mild   Calcification-severe   Review of the MIP images confirms the above findings.   IMPRESSION: Vascular:   1. Vascular findings and measurements pertinent to potential TAVR procedure, as detailed above. 2. Thickening and calcification of the aortic valve, compatible with reported clinical history of aortic stenosis. 3. Severe aortoiliac atherosclerosis. Coronary artery disease status post CABG.   Nonvascular:   1. Indeterminate exophytic lesion of the lower pole the right kidney measuring 9 mm. Recommend further evaluation with contrast-enhanced renal protocol MRI or CT. 2. Hepatic steatosis.     Electronically Signed   By: Allegra Lai M.D.   On: 02/04/2023 14:24       Impression:   This 77 year old woman has stage D, severe, symptomatic aortic stenosis with NYHA class III symptoms of progressive exertional fatigue and shortness of breath consistent with chronic diastolic congestive heart failure.  I have personally reviewed her 2D echocardiogram, cardiac catheterization, and CTA studies.  Her aortic valve is not well-visualized on echocardiogram but the mean gradient was 24 mmHg with a peak of 46 mmHg.  Left ventricular ejection fraction was 55 to 60% with grade 2 diastolic dysfunction.  Cardiac catheterization showed high-grade ostial left main coronary stenosis as well as total occlusion of the mid RCA and moderate disease in the mid LAD essentially unchanged from her prior catheterization in 2019.  There is an atretic LIMA to the LAD.  There is a widely patent vein graft to the diagonal branch with chronic total occlusion of the sequential portion to the obtuse marginal.  There is a widely patent vein  graft to the RCA.  The mean gradient measured across the aortic valve was 53 mmHg with a peak gradient of 72 mmHg and a valve area of 0.65 cm consistent with severe aortic stenosis.  Right heart pressures were within normal limits.  LVEDP was 22 mmHg.  I agree that aortic valve replacement is indicated in this patient for relief of her progressively worsening symptoms and to prevent left ventricular deterioration.  Given her age, prior coronary bypass surgery, and morbid obesity I think that transcatheter aortic valve replacement be the best option for treating her.  Her gated cardiac CTA shows a relatively small aortic annulus that would be suitable for a 20 mm SAPIEN 3 valve or a 26 mm Evolut FX valve.  Given her BMI of 41 I think an Evolut FX would be best.  Her abdominal and pelvic CTA shows adequate pelvic vascular anatomy to allow transfemoral insertion.   She has 5 broken off teeth that need to be extracted but said that she does not think the oral surgeon will be able to do that till later in August.  She is concerned about waiting until after that to have her valve replaced because of her worsening symptoms.  She is not having any pain from her  teeth and I think it would be reasonable to proceed ahead with TAVR with plans to have her broken teeth extracted postoperatively.   The patient and her son were counseled at length regarding treatment alternatives for management of severe symptomatic aortic stenosis. The risks and benefits of surgical intervention has been discussed in detail. Long-term prognosis with medical therapy was discussed. Alternative approaches such as conventional surgical aortic valve replacement, transcatheter aortic valve replacement, and palliative medical therapy were compared and contrasted at length. This discussion was placed in the context of the patient's own specific clinical presentation and past medical history. All of their questions have been addressed.    Following  the decision to proceed with transcatheter aortic valve replacement, a discussion was held regarding what types of management strategies would be attempted intraoperatively in the event of life-threatening complications, including whether or not the patient would be considered a candidate for the use of cardiopulmonary bypass and/or conversion to open sternotomy for attempted surgical intervention.  I do not think she is a candidate for emergent sternotomy to manage any intraoperative complications due to her prior coronary bypass surgery and comorbid risk factors.  The patient has been advised of a variety of complications that might develop including but not limited to risks of death, stroke, paravalvular leak, aortic dissection or other major vascular complications, aortic annulus rupture, device embolization, cardiac rupture or perforation, mitral regurgitation, acute myocardial infarction, arrhythmia, heart block or bradycardia requiring permanent pacemaker placement, congestive heart failure, respiratory failure, renal failure, pneumonia, infection, other late complications related to structural valve deterioration or migration, or other complications that might ultimately cause a temporary or permanent loss of functional independence or other long term morbidity. The patient provides full informed consent for the procedure as described and all questions were answered.       Plan:   She will tentatively be scheduled for transfemoral TAVR using a [employer] Evolut FX valve on 03/04/2023.  She is going to see an oral surgeon in the next week and we will make plans for extraction of her broken off teeth most likely sometime in  August.

## 2023-03-11 ENCOUNTER — Encounter (HOSPITAL_COMMUNITY): Payer: Self-pay | Admitting: Cardiovascular Disease

## 2023-03-11 ENCOUNTER — Encounter (HOSPITAL_COMMUNITY)
Admission: RE | Disposition: A | Discharge status: Home / Self Care | Payer: Self-pay | Source: Ambulatory Visit | Attending: Cardiovascular Disease

## 2023-03-11 ENCOUNTER — Inpatient Hospital Stay (HOSPITAL_COMMUNITY): Payer: Medicare Other | Admitting: Physician Assistant

## 2023-03-11 ENCOUNTER — Inpatient Hospital Stay (HOSPITAL_COMMUNITY): Payer: Medicare Other

## 2023-03-11 ENCOUNTER — Inpatient Hospital Stay (HOSPITAL_COMMUNITY): Payer: Medicare Other | Admitting: Certified Registered Nurse Anesthetist

## 2023-03-11 ENCOUNTER — Other Ambulatory Visit: Payer: Self-pay | Admitting: Physician Assistant

## 2023-03-11 ENCOUNTER — Inpatient Hospital Stay (HOSPITAL_COMMUNITY): Admission: RE | Admit: 2023-03-11 | Payer: Medicare Other | Source: Ambulatory Visit | Admitting: Cardiovascular Disease

## 2023-03-11 DIAGNOSIS — J4489 Other specified chronic obstructive pulmonary disease: Secondary | ICD-10-CM | POA: Diagnosis not present

## 2023-03-11 DIAGNOSIS — Z952 Presence of prosthetic heart valve: Principal | ICD-10-CM

## 2023-03-11 DIAGNOSIS — Z90710 Acquired absence of both cervix and uterus: Secondary | ICD-10-CM | POA: Diagnosis not present

## 2023-03-11 DIAGNOSIS — E785 Hyperlipidemia, unspecified: Secondary | ICD-10-CM | POA: Diagnosis not present

## 2023-03-11 DIAGNOSIS — Z951 Presence of aortocoronary bypass graft: Secondary | ICD-10-CM | POA: Diagnosis not present

## 2023-03-11 DIAGNOSIS — I252 Old myocardial infarction: Secondary | ICD-10-CM | POA: Diagnosis not present

## 2023-03-11 DIAGNOSIS — I350 Nonrheumatic aortic (valve) stenosis: Secondary | ICD-10-CM | POA: Diagnosis present

## 2023-03-11 DIAGNOSIS — Z853 Personal history of malignant neoplasm of breast: Secondary | ICD-10-CM

## 2023-03-11 DIAGNOSIS — N289 Disorder of kidney and ureter, unspecified: Secondary | ICD-10-CM | POA: Diagnosis present

## 2023-03-11 DIAGNOSIS — Z885 Allergy status to narcotic agent status: Secondary | ICD-10-CM

## 2023-03-11 DIAGNOSIS — I110 Hypertensive heart disease with heart failure: Secondary | ICD-10-CM | POA: Diagnosis not present

## 2023-03-11 DIAGNOSIS — I5032 Chronic diastolic (congestive) heart failure: Secondary | ICD-10-CM | POA: Diagnosis present

## 2023-03-11 DIAGNOSIS — Z9013 Acquired absence of bilateral breasts and nipples: Secondary | ICD-10-CM | POA: Diagnosis not present

## 2023-03-11 DIAGNOSIS — Z170 Estrogen receptor positive status [ER+]: Secondary | ICD-10-CM

## 2023-03-11 DIAGNOSIS — Z006 Encounter for examination for normal comparison and control in clinical research program: Secondary | ICD-10-CM

## 2023-03-11 DIAGNOSIS — F419 Anxiety disorder, unspecified: Secondary | ICD-10-CM | POA: Diagnosis present

## 2023-03-11 DIAGNOSIS — G4730 Sleep apnea, unspecified: Secondary | ICD-10-CM | POA: Diagnosis present

## 2023-03-11 DIAGNOSIS — Z9884 Bariatric surgery status: Secondary | ICD-10-CM

## 2023-03-11 DIAGNOSIS — I2510 Atherosclerotic heart disease of native coronary artery without angina pectoris: Secondary | ICD-10-CM | POA: Diagnosis present

## 2023-03-11 DIAGNOSIS — C50812 Malignant neoplasm of overlapping sites of left female breast: Secondary | ICD-10-CM

## 2023-03-11 DIAGNOSIS — Z803 Family history of malignant neoplasm of breast: Secondary | ICD-10-CM

## 2023-03-11 DIAGNOSIS — I10 Essential (primary) hypertension: Secondary | ICD-10-CM | POA: Diagnosis present

## 2023-03-11 DIAGNOSIS — Z888 Allergy status to other drugs, medicaments and biological substances status: Secondary | ICD-10-CM

## 2023-03-11 DIAGNOSIS — Z8249 Family history of ischemic heart disease and other diseases of the circulatory system: Secondary | ICD-10-CM

## 2023-03-11 DIAGNOSIS — I2582 Chronic total occlusion of coronary artery: Secondary | ICD-10-CM | POA: Diagnosis present

## 2023-03-11 DIAGNOSIS — I890 Lymphedema, not elsewhere classified: Secondary | ICD-10-CM | POA: Diagnosis present

## 2023-03-11 DIAGNOSIS — Z79899 Other long term (current) drug therapy: Secondary | ICD-10-CM

## 2023-03-11 DIAGNOSIS — G4733 Obstructive sleep apnea (adult) (pediatric): Secondary | ICD-10-CM | POA: Diagnosis present

## 2023-03-11 DIAGNOSIS — Z01818 Encounter for other preprocedural examination: Secondary | ICD-10-CM

## 2023-03-11 DIAGNOSIS — Z87891 Personal history of nicotine dependence: Secondary | ICD-10-CM

## 2023-03-11 DIAGNOSIS — Z882 Allergy status to sulfonamides status: Secondary | ICD-10-CM

## 2023-03-11 DIAGNOSIS — Z880 Allergy status to penicillin: Secondary | ICD-10-CM

## 2023-03-11 DIAGNOSIS — K760 Fatty (change of) liver, not elsewhere classified: Secondary | ICD-10-CM | POA: Diagnosis present

## 2023-03-11 DIAGNOSIS — E119 Type 2 diabetes mellitus without complications: Secondary | ICD-10-CM | POA: Diagnosis not present

## 2023-03-11 DIAGNOSIS — I5033 Acute on chronic diastolic (congestive) heart failure: Secondary | ICD-10-CM | POA: Diagnosis present

## 2023-03-11 DIAGNOSIS — Z6841 Body Mass Index (BMI) 40.0 and over, adult: Secondary | ICD-10-CM

## 2023-03-11 DIAGNOSIS — Z7984 Long term (current) use of oral hypoglycemic drugs: Secondary | ICD-10-CM

## 2023-03-11 DIAGNOSIS — Z7982 Long term (current) use of aspirin: Secondary | ICD-10-CM

## 2023-03-11 DIAGNOSIS — Z9049 Acquired absence of other specified parts of digestive tract: Secondary | ICD-10-CM

## 2023-03-11 HISTORY — PX: TRANSCATHETER AORTIC VALVE REPLACEMENT, TRANSFEMORAL: SHX6400

## 2023-03-11 HISTORY — PX: INTRAOPERATIVE TRANSTHORACIC ECHOCARDIOGRAM: SHX6523

## 2023-03-11 HISTORY — DX: Presence of prosthetic heart valve: Z95.2

## 2023-03-11 LAB — POCT I-STAT, CHEM 8
BUN: 25 mg/dL — ABNORMAL HIGH (ref 8–23)
Calcium, Ion: 1.26 mmol/L (ref 1.15–1.40)
Chloride: 103 mmol/L (ref 98–111)
Creatinine, Ser: 1.1 mg/dL — ABNORMAL HIGH (ref 0.44–1.00)
Glucose, Bld: 192 mg/dL — ABNORMAL HIGH (ref 70–99)
HCT: 40 % (ref 36.0–46.0)
Hemoglobin: 13.6 g/dL (ref 12.0–15.0)
Potassium: 4.1 mmol/L (ref 3.5–5.1)
Sodium: 142 mmol/L (ref 135–145)
TCO2: 30 mmol/L (ref 22–32)

## 2023-03-11 LAB — ECHOCARDIOGRAM LIMITED
AR max vel: 1.4 cm2
AV Area VTI: 1.38 cm2
AV Area mean vel: 1.65 cm2
AV Mean grad: 4 mmHg
AV Peak grad: 9.4 mmHg
Ao pk vel: 1.53 m/s
Area-P 1/2: 2.5 cm2
Est EF: >75
MV VTI: 1.19 cm2

## 2023-03-11 LAB — GLUCOSE, CAPILLARY
Glucose-Capillary: 126 mg/dL — ABNORMAL HIGH (ref 70–99)
Glucose-Capillary: 115 mg/dL — ABNORMAL HIGH (ref 70–99)
Glucose-Capillary: 155 mg/dL — ABNORMAL HIGH (ref 70–99)
Glucose-Capillary: 105 mg/dL — ABNORMAL HIGH (ref 70–99)

## 2023-03-11 LAB — PROTIME-INR
INR: 1 (ref 0.8–1.2)
Prothrombin Time: 13.7 seconds (ref 11.4–15.2)

## 2023-03-11 LAB — POCT ACTIVATED CLOTTING TIME: Activated Clotting Time: 409 seconds

## 2023-03-11 SURGERY — IMPLANTATION, AORTIC VALVE, TRANSCATHETER, FEMORAL APPROACH
Anesthesia: Monitor Anesthesia Care

## 2023-03-11 MED ORDER — CHLORHEXIDINE GLUCONATE 4 % EX SOLN: 30.0000 mL | CUTANEOUS | Status: DC

## 2023-03-11 MED ORDER — EZETIMIBE 10 MG PO TABS
10.0000 mg | ORAL_TABLET | Freq: Every morning | ORAL | Status: DC
  Administered 2023-03-12: 10 mg via ORAL
  Filled 2023-03-11: qty 1

## 2023-03-11 MED ORDER — SODIUM CHLORIDE 0.9 % IV SOLN: INTRAVENOUS | Status: DC

## 2023-03-11 MED ORDER — INSULIN ASPART 100 UNIT/ML IJ SOLN: 0.0000 [IU] | Freq: Three times a day (TID) | INTRAMUSCULAR | Status: DC

## 2023-03-11 MED ORDER — SODIUM CHLORIDE 0.9% FLUSH: 3.0000 mL | INTRAVENOUS | Status: DC | PRN

## 2023-03-11 MED ORDER — OXYCODONE HCL 5 MG PO TABS: 5.0000 mg | ORAL_TABLET | ORAL | Status: DC | PRN

## 2023-03-11 MED ORDER — CHLORHEXIDINE GLUCONATE 4 % EX SOLN: 60.0000 mL | Freq: Once | CUTANEOUS | Status: DC

## 2023-03-11 MED ORDER — LORATADINE 10 MG PO TABS
10.0000 mg | ORAL_TABLET | Freq: Every evening | ORAL | Status: DC
  Administered 2023-03-11: 10 mg via ORAL
  Filled 2023-03-11: qty 1

## 2023-03-11 MED ORDER — ISOSORBIDE MONONITRATE ER 60 MG PO TB24
60.0000 mg | ORAL_TABLET | Freq: Two times a day (BID) | ORAL | Status: DC
  Administered 2023-03-12: 60 mg via ORAL
  Filled 2023-03-11: qty 1

## 2023-03-11 MED ORDER — PROTAMINE SULFATE 10 MG/ML IV SOLN
INTRAVENOUS | Status: AC
  Filled 2023-03-11: qty 10

## 2023-03-11 MED ORDER — HEPARIN SODIUM (PORCINE) 1000 UNIT/ML IJ SOLN
INTRAMUSCULAR | Status: AC
  Filled 2023-03-11: qty 10

## 2023-03-11 MED ORDER — SODIUM CHLORIDE 0.9 % IV SOLN: 250.0000 mL | INTRAVENOUS | Status: DC | PRN

## 2023-03-11 MED ORDER — SODIUM CHLORIDE 0.9% FLUSH
3.0000 mL | Freq: Two times a day (BID) | INTRAVENOUS | Status: DC
  Administered 2023-03-12: 3 mL via INTRAVENOUS

## 2023-03-11 MED ORDER — LACTATED RINGERS IV SOLN
INTRAVENOUS | Status: DC | PRN
Start: 2023-03-11 — End: 2023-03-11

## 2023-03-11 MED ORDER — ATORVASTATIN CALCIUM 80 MG PO TABS
80.0000 mg | ORAL_TABLET | Freq: Every day | ORAL | Status: DC
  Administered 2023-03-11 – 2023-03-12 (×2): 80 mg via ORAL
  Filled 2023-03-11 (×2): qty 1

## 2023-03-11 MED ORDER — LIDOCAINE HCL (PF) 1 % IJ SOLN
INTRAMUSCULAR | Status: DC | PRN
  Administered 2023-03-11 (×2): 10 mL

## 2023-03-11 MED ORDER — PROPOFOL 500 MG/50ML IV EMUL
INTRAVENOUS | Status: DC | PRN
  Administered 2023-03-11: 25 ug/kg/min via INTRAVENOUS

## 2023-03-11 MED ORDER — HEPARIN (PORCINE) IN NACL 1000-0.9 UT/500ML-% IV SOLN
INTRAVENOUS | Status: DC | PRN
  Administered 2023-03-11 (×2): 500 mL

## 2023-03-11 MED ORDER — HEPARIN SODIUM (PORCINE) 1000 UNIT/ML IJ SOLN
INTRAMUSCULAR | Status: DC | PRN
  Administered 2023-03-11: 17000 [IU] via INTRAVENOUS

## 2023-03-11 MED ORDER — IODIXANOL 320 MG/ML IV SOLN
INTRAVENOUS | Status: DC | PRN
  Administered 2023-03-11: 45 mL via INTRA_ARTERIAL

## 2023-03-11 MED ORDER — ASPIRIN 81 MG PO TBEC
81.0000 mg | DELAYED_RELEASE_TABLET | Freq: Every day | ORAL | Status: DC
  Administered 2023-03-11 – 2023-03-12 (×2): 81 mg via ORAL
  Filled 2023-03-11 (×2): qty 1

## 2023-03-11 MED ORDER — FENTANYL CITRATE (PF) 100 MCG/2ML IJ SOLN
INTRAMUSCULAR | Status: DC | PRN
  Administered 2023-03-11 (×2): 25 ug via INTRAVENOUS
  Administered 2023-03-11: 50 ug via INTRAVENOUS

## 2023-03-11 MED ORDER — ONDANSETRON HCL 4 MG/2ML IJ SOLN
INTRAMUSCULAR | Status: DC | PRN
  Administered 2023-03-11: 4 mg via INTRAVENOUS

## 2023-03-11 MED ORDER — CHLORHEXIDINE GLUCONATE 0.12 % MT SOLN
15.0000 mL | Freq: Once | OROMUCOSAL | Status: AC
  Administered 2023-03-11: 15 mL via OROMUCOSAL
  Filled 2023-03-11: qty 15

## 2023-03-11 MED ORDER — CEFAZOLIN SODIUM-DEXTROSE 2-4 GM/100ML-% IV SOLN
2.0000 g | Freq: Three times a day (TID) | INTRAVENOUS | Status: AC
  Administered 2023-03-11 – 2023-03-12 (×2): 2 g via INTRAVENOUS
  Filled 2023-03-11 (×2): qty 100

## 2023-03-11 MED ORDER — PROTAMINE SULFATE 10 MG/ML IV SOLN
INTRAVENOUS | Status: DC | PRN
  Administered 2023-03-11: 20 mg via INTRAVENOUS
  Administered 2023-03-11: 150 mg via INTRAVENOUS

## 2023-03-11 MED ORDER — FENTANYL CITRATE (PF) 100 MCG/2ML IJ SOLN
INTRAMUSCULAR | Status: AC
  Filled 2023-03-11: qty 2

## 2023-03-11 MED ORDER — PROTAMINE SULFATE 10 MG/ML IV SOLN
INTRAVENOUS | Status: AC
  Filled 2023-03-11: qty 5

## 2023-03-11 MED ORDER — MORPHINE SULFATE (PF) 2 MG/ML IV SOLN: 1.0000 mg | INTRAVENOUS | Status: DC | PRN

## 2023-03-11 MED ORDER — LIDOCAINE HCL (PF) 1 % IJ SOLN
INTRAMUSCULAR | Status: AC
  Filled 2023-03-11: qty 30

## 2023-03-11 MED ORDER — ONDANSETRON HCL 4 MG/2ML IJ SOLN: 4.0000 mg | Freq: Four times a day (QID) | INTRAMUSCULAR | Status: DC | PRN

## 2023-03-11 MED ORDER — LISINOPRIL 5 MG PO TABS
5.0000 mg | ORAL_TABLET | Freq: Every day | ORAL | Status: DC
  Administered 2023-03-12: 5 mg via ORAL
  Filled 2023-03-11: qty 1

## 2023-03-11 MED ORDER — NITROGLYCERIN IN D5W 200-5 MCG/ML-% IV SOLN: 0.0000 ug/min | INTRAVENOUS | Status: DC

## 2023-03-11 MED ORDER — TRAMADOL HCL 50 MG PO TABS: 50.0000 mg | ORAL_TABLET | ORAL | Status: DC | PRN

## 2023-03-11 MED ORDER — SODIUM CHLORIDE 0.9 % IV SOLN: INTRAVENOUS | Status: AC

## 2023-03-11 SURGICAL SUPPLY — 35 items
BAG SNAP BAND KOVER 36X36 (MISCELLANEOUS) ×2 IMPLANT
BALLN TRUE 18X4.5 (BALLOONS) ×2
BALLOON TRUE 18X4.5 (BALLOONS) IMPLANT
CABLE ADAPT PACING TEMP 12FT (ADAPTER) IMPLANT
CATH DIAG 6FR PIGTAIL ANGLED (CATHETERS) IMPLANT
CATH INFINITI 5 FR STR PIGTAIL (CATHETERS) IMPLANT
CATH INFINITI 5FR ANG PIGTAIL (CATHETERS) IMPLANT
CATH INFINITI 6F AL1 (CATHETERS) IMPLANT
CATH-GARD ARROW CATH SHIELD (MISCELLANEOUS) ×1
CLOSURE PERCLOSE PROSTYLE (VASCULAR PRODUCTS) IMPLANT
KIT MICROPUNCTURE NIT STIFF (SHEATH) IMPLANT
PACK CARDIAC CATHETERIZATION (CUSTOM PROCEDURE TRAY) ×1 IMPLANT
SET ATX-X65L (MISCELLANEOUS) IMPLANT
SHEATH BRITE TIP 6FR 35CM (SHEATH) IMPLANT
SHEATH BRITE TIP 7FR 35CM (SHEATH) IMPLANT
SHEATH DRYSEAL FLEX 18FR 33CM (SHEATH) IMPLANT
SHEATH PINNACLE 6F 10CM (SHEATH) IMPLANT
SHEATH PINNACLE 8F 10CM (SHEATH) IMPLANT
SHEATH PROBE COVER 6X72 (BAG) IMPLANT
SHIELD CATHGARD ARROW (MISCELLANEOUS) IMPLANT
STOPCOCK MORSE 400PSI 3WAY (MISCELLANEOUS) ×2 IMPLANT
SYS EVOLUT FX DELIVERY 23-29 (CATHETERS) ×1
SYS EVOLUT FX LOADING 23-29 (CATHETERS) ×1
SYSTEM EVOLUT FX DELIVRY 23-29 (CATHETERS) IMPLANT
SYSTEM EVOLUT FX LOADING 23-29 (CATHETERS) IMPLANT
TRANSDUCER W/STOPCOCK (MISCELLANEOUS) ×2 IMPLANT
VALVE EVOLUT FX 26 (Valve) IMPLANT
WIRE AMPLATZ SS-J .035X180CM (WIRE) IMPLANT
WIRE EMERALD 3MM-J .035X150CM (WIRE) IMPLANT
WIRE EMERALD 3MM-J .035X260CM (WIRE) IMPLANT
WIRE EMERALD ST .035X260CM (WIRE) IMPLANT
WIRE HI TORQ VERSACORE 300 (WIRE) IMPLANT
WIRE PACING TEMP ST TIP 5 (CATHETERS) IMPLANT
WIRE SAFARI SM CURVE 275 (WIRE) IMPLANT
WIRE TORQFLEX AUST .018X40CM (WIRE) IMPLANT

## 2023-03-11 NOTE — Progress Notes (Addendum)
Attempted to call pts daughter post tavr, no answer to phone number listed, Son notified by Vernona Rieger, RN, that pt to transfer to [id number]

## 2023-03-11 NOTE — Discharge Summary (Incomplete)
[facility]   MULTIDISCIPLINARY HEART VALVE TEAM  Discharge Summary    Patient ID: Jamie [name] MRN: [id number]; DOB: 12/10/1945  Admit date: 03/11/2023 Discharge date: 03/12/2023  Primary Care Provider: Raynelle Jan., MD  Primary Cardiologist: Yvonne Kendall, MD / Dr. Clifton James & Dr. 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 No Sudden or severe rash/hives, skin peeling, or the inside of the mouth or nose? No Did it require medical treatment? #  #  #  YES  #  #  #  When did it last happen? Unknown    Vicodin [Hydrocodone-Acetaminophen] Other (See Comments)    Syncope    Duloxetine Hcl Nausea Only    Cymbalta*   Sulfa Antibiotics Rash    Childhood Allergy    Sulfamethoxazole Rash    Diagnostic Studies/Procedures    [facility]  TAVR OPERATIVE NOTE     Date of Procedure:                03/11/2023   Preoperative Diagnosis:      Severe Aortic Stenosis    Postoperative Diagnosis:    Same    Procedure:        Transcatheter Aortic Valve Replacement - Transfemoral Approach             Medtronic Evolut Pro THV (size 26 mm, model # [id number], serial #  [id number] )              Co-Surgeons:                        Verne Carrow, MD and Eugenio Hoes, MD   Anesthesiologist:                   Bradley Ferris   Echocardiographer:              Croitoru   Pre-operative Echo Findings: Severe aortic stenosis Normal left ventricular systolic function   Post-operative Echo Findings: No paravalvular leak Normal left ventricular systolic function _____________    Echo 03/12/23: completed but pending formal read at the time of discharge   History of Present Illness     Jamie [name] is a 77 y.o. female with a history of CAD s/p CABGx4V (2013 by Dr. Laneta Simmers), HLD, DMT2, morbid obesity (BMI 42), OSA, COPD, carotid artery disease, breast CA s/p bilateral mastectomies, chronic diastolic CHF, and severe aortic stenosis who presented to [facility] on [facility]11/23 for planned TAVR.   She previously suffered an NSTEMI in 2013 with diagnostic catheterization revealing severe multivessel disease, she underwent CABG x4. Stress testing was performed in May 2016 with finding of a small area of inferolateral ischemia. She was medically managed. She underwent repeat catheterization in 2019 in the setting of dyspnea on exertion and this revealed an  atretic LIMA to the LAD, patent vein graft to the diagonal with an occluded jump portion of the graft to the OM 2, and a patent vein graft to the RCA. She had a 70% ostial left main and 50% proximal LAD stenosis. FFR was measured through these stenoses, and was normal at 0.82. She was placed on isosorbide therapy. She has also been followed over time for known aortic stenosis. Most recent echo 11/27/22 showed EF 55-60%, G2DD, and moderate AS with a mean gradient of 24 mm hg, peak gradient 45.7 mm hg, AVA 1.28cm2, DVI 0.5, SVI 45.    Seen in the office in May for worsening DOE, LE edema and arm discomfort. Initially PET stress was planned but converted to a heart cath given accelerating symptoms. [facility] 01/06/23 showed stable, multivessel native coronary artery disease with patent vein graft to the distal RCA, patent vein graft to the D1 with known occlusion of the continuation to the OM  2, and atretic LIMA to the LAD. Overall, coronary anatomy felt to be stable. Filling pressures were elevated with a wedge of 20 and PA 43/20 with normal CO/CI. Additionally, she was also noted to have normal LV function and severe AS with a mean gradient of 56 mmHg and calculated valve area of 0.7 cm. In that setting, lasix increased to 40mg  BID. Metoprolol increased and nitrates reduced.     She continued to have symptoms despite escalation of diuretics and TAVR work up was initiated. The patient was evaluated by the multidisciplinary valve team and felt to have severe, symptomatic aortic stenosis and to be a suitable candidate for TAVR, which was set up for 03/04/23. This got postponed to 03/11/23 when she contracted symptomatic Covid 19.  Hospital Course     Consultants: none  Severe AS: s/p successful TAVR with a 26 mm Medtronic Evolut FX THV via the TF approach on 03/11/23. Post operative echo completed but pending formal read. Groin sites are stable. ECG with sinus and no high grade heart block. Continued on home Asprin 81 mg daily. Walked with cardiac rehab with no issues. Plan for discharge home today with close follow up in the outpatient setting.    Chronic HFpEF: appears euvolemic. Resume home lasix 40mg  BID and Potassium 20 meq daily.    CAD s/p CABG x4V: recent cath revealed overall stable anatomy. Medical therapy recommended. Continue aspirin, statin, beta-blocker, nitrate.   HTN: BP elevated in the setting of holding home anti hypertensives. These will be resumed and will follow pressures.    Elevated LFTS: noted on PAT labs. Will repeat next week in the office.   DMT2: treated with SSI while admitted. Resume home meds at discharge. Okay to resume Metformin after 48 hours after contrast dye exposure (8/9)   Morbid obesity: Body mass index is 42.78 kg/m. May be a good GLP1 agonist candidate at some point.   Poor dentition: she will require 5 extractions. Will push this out at least 1  month out from TAVR and make sure she uses SBE prophylaxis.   Renal lesion: pre TAVR showed an "indeterminate exophytic lesion of the lower pole the right kidney measuring 9 mm. Recommend further evaluation with contrast-enhanced renal protocol MRI or CT." Will discuss in the outpatient setting.  _____________  Discharge Vitals Blood pressure (!) 159/45, pulse 94, temperature (!) 97.2 F (36.2 C), temperature source Oral, resp. rate 20, height 5\' 4"  (1.626 m), weight 113 kg, SpO2 94%.  Filed Weights   03/10/23 1400 03/11/23 1053  Weight: 113 kg 113 kg     GEN: Well nourished, well developed, in no acute distress, obese HEENT: normal Neck: no JVD or masses Cardiac: RRR; no murmurs, rubs, or gallops,no edema  Respiratory:  clear to auscultation bilaterally, normal work of breathing GI: soft, nontender, nondistended, + BS MS: no deformity or atrophy Skin: warm and dry, no rash.  Groin sites clear without hematoma or ecchymosis  Neuro:  Alert and Oriented x 3, Strength and sensation are intact Psych: euthymic mood, full affect   Labs & Radiologic Studies    CBC Recent Labs    03/11/23 1631 03/12/23 0624  WBC  --  7.5  HGB 13.6 12.7  HCT 40.0 39.4  MCV  --  89.1  PLT  --  106*   Basic Metabolic Panel Recent Labs    47/82/95 1631 03/12/23 0624  NA 142 138  K 4.1 4.3  CL 103 102  CO2  --  18*  GLUCOSE 192* 92  BUN 25* 22  CREATININE 1.10* 1.16*  CALCIUM  --  8.7*  MG  --  2.0   Liver Function Tests No results for input(s): "AST", "ALT", "ALKPHOS", "BILITOT", "PROT", "ALBUMIN" in the last 72 hours. No results for input(s): "LIPASE", "AMYLASE" in the last 72 hours. Cardiac Enzymes No results for input(s): "CKTOTAL", "CKMB", "CKMBINDEX", "TROPONINI" in the last 72 hours. BNP Invalid input(s): "POCBNP" D-Dimer No results for input(s): "DDIMER" in the last 72 hours. Hemoglobin A1C No results for input(s): "HGBA1C" in the last 72 hours. Fasting Lipid Panel No  results for input(s): "CHOL", "HDL", "LDLCALC", "TRIG", "CHOLHDL", "LDLDIRECT" in the last 72 hours. Thyroid Function Tests No results for input(s): "TSH", "T4TOTAL", "T3FREE", "THYROIDAB" in the last 72 hours.  Invalid input(s): "FREET3" _____________  ECHOCARDIOGRAM LIMITED  Result Date: 03/11/2023    ECHOCARDIOGRAM LIMITED REPORT   Patient Name:   Jamie [name] Date of Exam: 03/11/2023 Medical Rec #:  [id number]     Height:       64.0 in Accession #:    [id number]    Weight:       249.2 lb Date of Birth:  Jul 11, 1946     BSA:          2.148 m Patient Age:    77 years      BP:           140/64 mmHg Patient Gender: F             HR:           74 bpm. Exam Location:  Inpatient Procedure: Limited Echo, Limited Color Doppler and Cardiac Doppler Indications:    Aortic valve stenosis. TAVR placement  History:        Patient has prior history of Echocardiogram examinations, most                 recent 11/27/2022. CHF, CAD, COPD; Risk Factors:Dyslipidemia,                 Sleep Apnea and Diabetes.                 Aortic Valve: 26 mm Medtronic CoreValve-Evolut Pro prosthetic,                 stented (TAVR) valve is present in the aortic position.  Sonographer:    Delcie Roch RDCS Referring Phys: [id number]  D [facility]  Sonographer Comments: Patient is obese and Technically difficult study due to poor echo windows. Image acquisition  challenging due to patient body habitus. PREOPERATIVE EVALUATION Hyperdynamic left ventricular function, estimated LVEF 75%. Severe calcific aortic stenosis. Peak gradient 63 mm Hg, mean gradient 37 mm Hg, dimensionless index 0.27, calculated valve area 0.7 cm sq (0.32 cm sq/ m sq indexed for BSA. Mild aortic insufficiency. No significant mitral insufficiency. No pericardial effusion. POSTOPERATIVE EVALUATION Hyperdynamic left ventricular function, estimated LVEF 75%. Well seated TAVR stent valve. Peak gradient 9 mm Hg, mean gradient 4 mm Hg, dimensionless index 0.62, calculated  valve area 1.4 cm sq (0.64 cm sq/ m sq indexed for BSA), acceleration time 63 ms. No aortic insufficiency/perivalvular leak. No significant mitral insufficiency. No pericardial effusion.   IMPRESSIONS  1. Left ventricular ejection fraction, by estimation, is >75%. The left ventricle has hyperdynamic function. The left ventricle has no regional wall motion abnormalities. Left ventricular diastolic parameters are consistent with Grade II diastolic dysfunction (pseudonormalization).  2. Right ventricular systolic function is hyperdynamic. The right ventricular size is normal.  3. Left atrial size was moderately dilated.  4. Moderate to severe mitral annular calcification.  5. The aortic valve has been repaired/replaced. There is a 26 mm Medtronic CoreValve-Evolut Pro prosthetic (TAVR) valve present in the aortic position. Aortic valve mean gradient measures 4.0 mmHg. Aortic valve Vmax measures 1.53 m/s. Aortic valve acceleration time measures 63 msec. FINDINGS  Left Ventricle: Left ventricular ejection fraction, by estimation, is >75%. The left ventricle has hyperdynamic function. The left ventricle has no regional wall motion abnormalities. Left ventricular diastolic parameters are consistent with Grade II diastolic dysfunction (pseudonormalization). Right Ventricle: The right ventricular size is normal. Right ventricular systolic function is hyperdynamic. Left Atrium: Left atrial size was moderately dilated. Pericardium: There is no evidence of pericardial effusion. Mitral Valve: Moderate to severe mitral annular calcification. MV peak gradient, 8.8 mmHg. The mean mitral valve gradient is 5.0 mmHg. Tricuspid Valve: The tricuspid valve is grossly normal. Tricuspid valve regurgitation is not demonstrated. Aortic Valve: The aortic valve has been repaired/replaced. Aortic valve mean gradient measures 4.0 mmHg. Aortic valve peak gradient measures 9.4 mmHg. Aortic valve area, by VTI measures 1.38 cm. There is a 26 mm  Medtronic CoreValve-Evolut Pro prosthetic, stented (TAVR) valve present in the aortic position. Pulmonic Valve: The pulmonic valve was not well visualized. Additional Comments: Spectral Doppler performed. Color Doppler performed.  LEFT VENTRICLE PLAX 2D LVOT diam:     1.70 cm LV SV:         45 LV SV Index:   21 LVOT Area:     2.27 cm  AORTIC VALVE AV Area (Vmax):    1.40 cm AV Area (Vmean):   1.65 cm AV Area (VTI):     1.38 cm AV Vmax:           153.00 cm/s AV Vmean:          95.200 cm/s AV VTI:            0.328 m AV Peak Grad:      9.4 mmHg AV Mean Grad:      4.0 mmHg LVOT Vmax:         94.20 cm/s LVOT Vmean:        69.200 cm/s LVOT VTI:          0.199 m LVOT/AV VTI ratio: 0.61  AORTA Ao Root diam: 2.60 cm MITRAL VALVE MV Area (PHT): 2.50 cm    SHUNTS MV Area VTI:   1.19 cm    Systemic VTI:  0.20 m MV Peak grad:  8.8 mmHg    Systemic Diam: 1.70 cm MV Mean grad:  5.0 mmHg MV Vmax:       1.48 m/s MV Vmean:      105.0 cm/s Rachelle Hora Croitoru MD Electronically signed by Thurmon Fair MD Signature Date/Time: 03/11/2023/6:34:36 PM    Final    Structural Heart Procedure  Result Date: 03/11/2023 See surgical note for result.  DG Chest 2 View  Result Date: 03/07/2023 CLINICAL DATA:  Preop exam, aortic stenosis. EXAM: CHEST - 2 VIEW COMPARISON:  CTA chest 02/04/2023 FINDINGS: Median sternotomy wires are again noted. The heart is enlarged, unchanged. The upper mediastinal contours are normal. There is no focal consolidation or pulmonary edema. There is no pleural effusion or pneumothorax There is no acute osseous abnormality. IMPRESSION: No radiographic evidence of acute cardiopulmonary process. Electronically Signed   By: Lesia Hausen M.D.   On: 03/07/2023 11:07   Disposition   Pt is being discharged home today in good condition.  Follow-up Plans & Appointments     Follow-up Information     Janetta Hora, PA-C. Go on 03/19/2023.   Specialties: Cardiology, Radiology Why: @ 1pm, please arrive at least  10 min early. Contact information: [address] [place] [address] [phone]                  Discharge Medications   Allergies as of 03/12/2023       Reactions   Penicillins Nausea Only, Other (See Comments)   DID THE REACTION INVOLVE: Swelling of the face/tongue/throat, SOB, or low BP? No Sudden or severe rash/hives, skin peeling, or the inside of the mouth or nose? No Did it require medical treatment? #  #  #  YES  #  #  #  When did it last happen? Unknown   Vicodin [hydrocodone-acetaminophen] Other (See Comments)   Syncope   Duloxetine Hcl Nausea Only   Cymbalta*   Sulfa Antibiotics Rash   Childhood Allergy    Sulfamethoxazole Rash        Medication List     STOP taking these medications    cephALEXin 500 MG capsule Commonly known as: KEFLEX       TAKE these medications    albuterol 108 (90 Base) MCG/ACT inhaler Commonly known as: VENTOLIN HFA Inhale 1-2 puffs into the lungs every 6 (six) hours as needed for wheezing or shortness of breath.   albuterol (2.5 MG/3ML) 0.083% nebulizer solution Commonly known as: PROVENTIL Inhale 3 mLs into the lungs every 6 (six) hours as needed for wheezing or shortness of breath.   aspirin EC 81 MG tablet Take 1 tablet (81 mg total) by mouth daily. What changed: when to take this   atorvastatin 80 MG tablet Commonly known as: LIPITOR TAKE 1 TABLET BY MOUTH ONCE DAILY AT  6PM   ezetimibe 10 MG tablet Commonly known as: ZETIA Take 10 mg by mouth in the morning.   fluticasone 50 MCG/ACT nasal spray Commonly known as: FLONASE Place 2 sprays into both nostrils daily as needed for allergies.   furosemide 40 MG tablet Commonly known as: LASIX Take 1 tablet (40 mg total) by mouth 2 (two) times daily.   isosorbide mononitrate 60 MG 24 hr tablet Commonly known as: IMDUR Take 1 tablet by mouth twice daily   lisinopril 5 MG tablet Commonly known as: ZESTRIL Take 1 tablet by mouth once  daily   loratadine 10 MG tablet Commonly known as: CLARITIN Take 10 mg by  mouth every evening.   metFORMIN 500 MG tablet Commonly known as: GLUCOPHAGE Take 1 tablet (500 mg total) by mouth every evening. Notes to patient: Okay to resume Metformin after 48 hours after contrast dye exposure (8/9)   metoprolol tartrate 50 MG tablet Commonly known as: LOPRESSOR Take 1 tablet (50 mg total) by mouth 2 (two) times daily.   MULTI VITAMIN DAILY PO Take 1 tablet by mouth in the morning.   nitroGLYCERIN 0.4 MG SL tablet Commonly known as: NITROSTAT Place 1 tablet (0.4 mg total) under the tongue every 5 (five) minutes as needed.   Polyethyl Glycol-Propyl Glycol 0.4-0.3 % Soln Place 1 drop into both eyes 4 (four) times daily as needed (for dry eyes).   Potassium Chloride ER 20 MEQ Tbcr Take 1 tablet (20 mEq total) by mouth daily.   Vitamin D3 25 MCG (1000 UT) Caps Take 1,000 Units by mouth in the morning.          Outstanding Labs/Studies   CMET  Duration of Discharge Encounter   Greater than 30 minutes including physician time.  SignedCline Crock, PA-C 03/12/2023, 8:55 AM [phone]  I have personally seen and examined this patient. I agree with the assessment and plan as outlined above.  Doing well post TAVR with Medtronic Evolut THV.  Groins stable. BP stable. Sinus on telemetry.   Verne Carrow, MD, [facility] 03/12/2023 10:07 AM

## 2023-03-11 NOTE — Op Note (Signed)
[facility]   MULTIDISCIPLINARY HEART VALVE TEAM     TAVR OPERATIVE NOTE   NAME@ [id number]  Date of Procedure:                 03/11/2023   Preoperative Diagnosis:      Severe Aortic Stenosis    Postoperative Diagnosis:    Same    Procedure:        Transcatheter Aortic Valve Replacement - Percutaneous right Transfemoral Approach             Medtronic Evolut FX  (size 26 mm,)              Co-Surgeons:            Eugenio Hoes, MD and Earney Hamburg, MD     Anesthesiologist:                  Karna Christmas, MD   Echocardiographer:              Croitoru, MD   Pre-operative Echo Findings: Severe aortic stenosis  Normal left ventricular systolic function   Post-operative Echo Findings: no paravalvular leak Normal left ventricular systolic function      BRIEF CLINICAL NOTE AND INDICATIONS FOR SURGERY   This 77 year old woman has stage D, severe, symptomatic aortic stenosis with NYHA class III symptoms of progressive exertional fatigue and shortness of breath consistent with chronic diastolic congestive heart failure.         DETAILS OF THE OPERATIVE PROCEDURE    PREPARATION:    The patient was brought to the operating room on the above mentioned date and appropriate monitoring was established by the anesthesia team. The patient was placed in the supine position on the operating table.  Intravenous antibiotics were administered. The patient was monitored closely throughout the procedure under conscious sedation.  Baseline transthoracic echocardiogram was performed. The patient's abdomen and both groins were prepped and draped in a sterile manner. A time out procedure was performed.   PERIPHERAL ACCESS:    Using the modified Seldinger technique, femoral arterial and venous access was obtained with placement of 6 Fr sheaths on the left side.  A pigtail diagnostic catheter was passed through the left arterial sheath under fluoroscopic guidance into the aortic  root.  A temporary transvenous pacemaker catheter was passed through the left femoral venous sheath under fluoroscopic guidance into the right ventricle.  The pacemaker was tested to ensure stable lead placement and pacemaker capture. Aortic root angiography was performed in order to determine the optimal angiographic angle for valve deployment.    TRANSFEMORAL ACCESS:    Percutaneous transfemoral access and sheath placement was performed using ultrasound guidance.  The right common femoral artery was cannulated using a micropuncture needle.  A pair of Abbott Perclose percutaneous closure devices were placed and a 6 French sheath replaced into the femoral artery.  The patient was heparinized systemically and ACT verified > 250 seconds.     An 18 Fr transfemoral Gore Dry-Seal sheath was introduced into the right femoral artery after progressively dilating over an Amplatz superstiff wire. An AL-1 catheter was used to direct a straight-tip exchange length wire across the native aortic valve into the left ventricle. This was exchanged out for a pigtail catheter and position was confirmed in the LV apex. Simultaneous LV and Ao pressures were recorded.  The pigtail catheter was exchanged for a Safari wire in the LV apex.    BALLOON AORTIC VALVULOPLASTY:  Under rapid ventricular pacing, an 57 F truballoon valvuloplasty was perfromed and pt tolerated procedure well   TRANSCATHETER HEART VALVE DEPLOYMENT:    A Medtronic Evolut FX transcatheter heart valve (size 26 mm) was prepared and loaded into the delivery catheter system per manufacturer's guidelines and the proper orientation of the valve is confirmed under fluoroscopy. The delivery system and inline sheath were inserted into the right common femoral artery over the [facility] wire and the inline sheath advanced into the abdominal aorta under fluoroscopic guidance. The delivery catheter was advanced around the aortic arch and the valve was carefully  positioned across the aortic valve annulus. An aortic root injection was performed to confirm position and the valve deployed using cusp overlap technique under fluoroscopic guidance. Intermittent pacing was used during valve deployment. The delivery system and guidewire were retracted into the descending aorta and the nosecone re-sheathed. Valve function was assessed using echocardiography. There is felt to be no paravalvular leak and no central aortic insufficiency. The patient's hemodynamic recovery following valve deployment is good.        PROCEDURE COMPLETION:    The delivery system and in-line sheath were removed and femoral artery closure performed using the Perclose devices.  Protamine was administered once femoral arterial repair was complete. The temporary pacemaker, pigtail catheters and femoral sheaths were removed with manual pressure used venous for hemostasis and a Mynx closure device for contralateral arterial hemostasis.    The patient tolerated the procedure well and is transported to the cath lab recovery area in stable condition. There were no immediate intraoperative complications. All sponge instrument and needle counts are verified correct at completion of the operation.    No blood products were administered during the operation.   The patient received a total of 30 mL of intravenous contrast during the procedure.    Eugenio Hoes, MD

## 2023-03-11 NOTE — Transfer of Care (Signed)
Immediate Anesthesia Transfer of Care Note  Patient: Jamie [name]  Procedure(s) Performed: Transcatheter Aortic Valve Replacement, Transfemoral INTRAOPERATIVE TRANSTHORACIC ECHOCARDIOGRAM  Patient Location: Cath Lab  Anesthesia Type:MAC  Level of Consciousness: drowsy  Airway & Oxygen Therapy: Patient Spontanous Breathing and Patient connected to face mask oxygen  Post-op Assessment: Report given to RN and Post -op Vital signs reviewed and stable  Post vital signs: Reviewed and stable  Last Vitals:  Vitals Value Taken Time  BP 112/47 03/11/23 1641  Temp    Pulse 70 03/11/23 1642  Resp 18 03/11/23 1642  SpO2 95 % 03/11/23 1642  Vitals shown include unfiled device data.  Last Pain:  Vitals:   03/11/23 1107  TempSrc:   PainSc: 0-No pain      Patients Stated Pain Goal: 0 (03/11/23 1107)  Complications: There were no known notable events for this encounter.

## 2023-03-11 NOTE — Anesthesia Postprocedure Evaluation (Signed)
Anesthesia Post Note  Patient: Jamie [name]  Procedure(s) Performed: Transcatheter Aortic Valve Replacement, Transfemoral INTRAOPERATIVE TRANSTHORACIC ECHOCARDIOGRAM     Patient location during evaluation: Cath Lab Anesthesia Type: MAC Level of consciousness: awake Pain management: pain level controlled Vital Signs Assessment: post-procedure vital signs reviewed and stable Respiratory status: spontaneous breathing, nonlabored ventilation and respiratory function stable Cardiovascular status: blood pressure returned to baseline and stable Postop Assessment: no apparent nausea or vomiting Anesthetic complications: no   There were no known notable events for this encounter.  Last Vitals:  Vitals:   03/11/23 1745 03/11/23 1800  BP: (!) 96/44 (!) 98/47  Pulse: (!) 55 (!) 55  Resp: 18 18  Temp:    SpO2: 94% 95%    Last Pain:  Vitals:   03/11/23 1800  TempSrc:   PainSc: 0-No pain                  P 

## 2023-03-11 NOTE — Progress Notes (Signed)
Per report from lab, Ian Malkin, supervisor aware), BMP completed per I-[employer], and downloaded

## 2023-03-11 NOTE — CV Procedure (Signed)
[facility]  TAVR OPERATIVE NOTE   Date of Procedure:  03/11/2023  Preoperative Diagnosis: Severe Aortic Stenosis   Postoperative Diagnosis: Same   Procedure:   Transcatheter Aortic Valve Replacement - Transfemoral Approach  Medtronic Evolut Pro THV (size 26 mm, model # [id number], serial #  [id number] )   Co-Surgeons:  Verne Carrow, MD and Eugenio Hoes, MD  Anesthesiologist:  Ellender  Echocardiographer:  Croitoru  Pre-operative Echo Findings: Severe aortic stenosis Normal left ventricular systolic function  Post-operative Echo Findings: No paravalvular leak Normal left ventricular systolic function  BRIEF CLINICAL NOTE AND INDICATIONS FOR SURGERY  [name]  is a 77 yo female with history of CAD s/p CABG, DM, HLD, COPD, sleep apnea, carotid artery disease, breast cancer, chronic diastolic CHF and severe AS. She has been evaluated by our structural heart team for worsening dyspnea on exertion. She underwent repeat catheterization in 2019 in the setting of dyspnea on exertion and this revealed an atretic LIMA to the LAD, patent vein graft to the diagonal with an occluded jump portion of the graft to the OM 2, and a patent vein graft to the RCA. She had a 70% ostial left main and 50% proximal LAD stenosis. FFR was measured through these stenoses, and was normal at 0.82. She was placed on isosorbide therapy. She has also been followed over time for known aortic stenosis. Most recent echo 11/27/22 showed EF 55-60%, G2DD, and moderate AS with a mean gradient of 24 mm hg, peak gradient 45.7 mm hg, AVA 1.28cm2, DVI 0.5, SVI 45. Seen in the office in May for worsening DOE, LE edema and arm discomfort. Initially PET stress was planned but converted to a heart cath given accelerating symptoms. [facility] 01/06/23 showed stable, multivessel native coronary artery disease with patent vein graft to the distal RCA, patent vein graft to the D1 with known occlusion of the continuation to the  OM 2, and atretic LIMA to the LAD. Overall, coronary anatomy felt to be stable. Filling pressures were elevated with a wedge of 20 and PA 43/20 with normal CO/CI. Additionally, she was also noted to have normal LV function and severe AS with a mean gradient of 56 mmHg and calculated valve area of 0.7 cm.   During the course of the patient's preoperative work up they have been evaluated comprehensively by a multidisciplinary team of specialists coordinated through the Multidisciplinary [facility] Clinic in the [facility].  They have been demonstrated to suffer from symptomatic severe aortic stenosis as noted above. The patient has been counseled extensively as to the relative risks and benefits of all options for the treatment of severe aortic stenosis including long term medical therapy, conventional surgery for aortic valve replacement, and transcatheter aortic valve replacement.  The patient has been independently evaluated by Dr. Leafy Ro and Dr. Laneta Simmers with CT surgery and they are felt to be at high risk for conventional surgical aortic valve replacement. The surgeon indicated the patient would be a poor candidate for conventional surgery. Based upon review of all of the patient's preoperative diagnostic tests they are felt to be candidate for transcatheter aortic valve replacement using the transfemoral approach as an alternative to high risk conventional surgery.    Following the decision to proceed with transcatheter aortic valve replacement, a discussion has been held regarding what types of management strategies would be attempted intraoperatively in the event of life-threatening complications, including whether or not the patient would be considered a candidate for  the use of cardiopulmonary bypass and/or conversion to open sternotomy for attempted surgical intervention.  The patient has been advised of a variety of complications that might develop peculiar to this approach  including but not limited to risks of death, stroke, paravalvular leak, aortic dissection or other major vascular complications, aortic annulus rupture, device embolization, cardiac rupture or perforation, acute myocardial infarction, arrhythmia, heart block or bradycardia requiring permanent pacemaker placement, congestive heart failure, respiratory failure, renal failure, pneumonia, infection, other late complications related to structural valve deterioration or migration, or other complications that might ultimately cause a temporary or permanent loss of functional independence or other long term morbidity.  The patient provides full informed consent for the procedure as described and all questions were answered preoperatively.    DETAILS OF THE OPERATIVE PROCEDURE  PREPARATION:   The patient is brought to the operating room on the above mentioned date and central monitoring was established by the anesthesia team including placement of a radial arterial line. The patient is placed in the supine position on the operating table.  Intravenous antibiotics are administered. Conscious sedation is used.   Baseline transthoracic echocardiogram was performed. The patient's chest, abdomen, both groins, and both lower extremities are prepared and draped in a sterile manner. A time out procedure is performed.   PERIPHERAL ACCESS:   Using the modified Seldinger technique, femoral arterial and venous access were obtained with placement of a 6 Fr sheath in the artery and a 7 Fr sheath in the vein on the left side using u/s guidance.  A pigtail diagnostic catheter was passed through the femoral arterial sheath under fluoroscopic guidance into the aortic root.  A temporary transvenous pacemaker catheter was passed through the femoral venous sheath under fluoroscopic guidance into the right ventricle.  The pacemaker was tested to ensure stable lead placement and pacemaker capture.   TRANSFEMORAL ACCESS:  A  micropuncture kit was used to gain access to the right femoral artery using u/s guidance. Position confirmed with angiography. Pre-closure with double ProGlide closure devices. The patient was heparinized systemically and ACT verified > 250 seconds.    A 18 Fr transfemoral Dry Seal sheath was introduced into the right femoral artery over an Amplatz superstiff wire. An AL-1 catheter was used to direct a straight-tip exchange length wire across the native aortic valve into the left ventricle. This was exchanged out for a pigtail catheter and position was confirmed in the LV apex. Simultaneous LV and Ao pressures were recorded.  The pigtail catheter was then exchanged for an Confida wire in the LV apex. A 18 mm True balloon was used to perform balloon valvuloplasty.   TRANSCATHETER HEART VALVE DEPLOYMENT:  A Medtronic Evolut Pro THV size 26 mm was prepared and per manufacturer's guidelines, and the proper orientation of the valve is confirmed on the delivery system.  The valve was advanced through the introducer sheath into the descending aorta. The valve was then advanced across the aortic arch. The valve was carefully positioned across the aortic valve annulus. Once final position of the valve has been confirmed with angiographic assessment in the cusp overlap view and in the LAO view, the valve is deployed with controlled rapid pacing. The valve is taken to 80% deployment and appropriate depth is confirmed. The valve is then released from each paddle. There is no valvular leak and no central aortic insufficiency.  The patient's hemodynamic recovery following valve deployment is good.  Echo demostrated acceptable post-procedural gradients, stable mitral valve function, and no  AI.   PROCEDURE COMPLETION:  The sheath was then removed and closure devices were completed. Protamine was administered once femoral arterial repair was complete. The temporary pacemaker, pigtail catheters and femoral sheaths were  removed with a Perclose closure device placed in the artery and manual pressure used for venous hemostasis.    The patient tolerated the procedure well and is transported to the surgical intensive care in stable condition. There were no immediate intraoperative complications. All sponge instrument and needle counts are verified correct at completion of the operation.   No blood products were administered during the operation.  The patient received a total of 45 mL of intravenous contrast during the procedure.  LVEDP=4 mm Hg  Verne Carrow MD, [facility] 03/11/2023 4:26 PM

## 2023-03-11 NOTE — H&P (Signed)
Cardiology Admission History and Physical   Patient ID: Jamie [name] MRN: [id number]; DOB: [birth year]   Admission date: 03/11/2023  PCP:  Raynelle Jan., MD   [facility] Providers Cardiologist:  Yvonne Kendall, MD   {    Chief Complaint:  Dyspnea   History of Present Illness:   [name]  is a 77 yo female with history of CAD s/p CABG, DM, HLD, COPD, sleep apnea, carotid artery disease, breast cancer, chronic diastolic CHF and severe AS. She has been evaluated by our structural heart team for worsening dyspnea on exertion. She underwent repeat catheterization in 2019 in the setting of dyspnea on exertion and this revealed an atretic LIMA to the LAD, patent vein graft to the diagonal with an occluded jump portion of the graft to the OM 2, and a patent vein graft to the RCA. She had a 70% ostial left main and 50% proximal LAD stenosis. FFR was measured through these stenoses, and was normal at 0.82. She was placed on isosorbide therapy. She has also been followed over time for known aortic stenosis. Most recent echo 11/27/22 showed EF 55-60%, G2DD, and moderate AS with a mean gradient of 24 mm hg, peak gradient 45.7 mm hg, AVA 1.28cm2, DVI 0.5, SVI 45. Seen in the office in May for worsening DOE, LE edema and arm discomfort. Initially PET stress was planned but converted to a heart cath given accelerating symptoms. [facility] 01/06/23 showed stable, multivessel native coronary artery disease with patent vein graft to the distal RCA, patent vein graft to the D1 with known occlusion of the continuation to the OM 2, and atretic LIMA to the LAD. Overall, coronary anatomy felt to be stable. Filling pressures were elevated with a wedge of 20 and PA 43/20 with normal CO/CI. Additionally, she was also noted to have normal LV function and severe AS with a mean gradient of 56 mmHg and calculated valve area of 0.7 cm. In that setting, lasix increased to 40mg  BID. Metoprolol increased and nitrates  reduced.  She was seen back in the office by Ward Givens NP on 01/24/23 with worsening symptoms of SOB and LE edema despite increased urine output. Also complained on one episode of nitrate responsive chest pain. Lasix increased to 80mg  BID x3 days then dropped back down to 40mg  BID. She was referred to structural heart for consideration of TAVR.   Her evaluation has been completed and she is here today for TAVR.    Past Medical History:  Diagnosis Date   Anxiety    Aortic stenosis    a. 12/2014 Echo:  EF 60-65%, no RWMA, Gr 1 DD, Ao sclerosis, mild AS, mean 14 mmHg, MAC; b. 10/2020 Echo: EF 60-65%, no rwma, GrII DD, nl RV fxn, mildly dil LA, Mild-mod AS; c. 11/2022 Echo: EF 55-60%, mod AS (AVA 1.28cm^2); d. Cath: Mean gradient , AVA 0.7 cm^2.   Asthma    Breast cancer (HCC) 08/2011   lt. breast ca   CAD (coronary artery disease)    a. 12/2011 NSTEMI/Cath: Sev 3VD; b. 12/2011 CABG x4: LIMA-LAD, SVG-RCA, SVG-D1-->OM2; c. 08/2017 Cath: Sev native dzs, LIMA->LAD atretic, VG->D1 ok w/ 100 @ continuation to OM2, VG->dRCA ok-->Med rx; c. 01/2023 Cath: LM 70ost, LAD 50p, LCX nl, RCA 165m, VG->dRCA nl, VG->D1 ok w/ 100% continuation to OM2, LIMA->LAD atretic-->overall unchanged->Med Rx.   Carotid artery disease (HCC)    a.  Pre-CABG Dopplers 5/13: Bilateral 40-59%; b.  Carotid US 6/16: RICA 40-59%, LICA  60-79%, normal subclavians bilaterally;  c. Carotid US 11/16: RICA 40-59%; LICA 40-59%, normal subclavians; d. Carotid US 11/17:  Stable 40-59% bilateral ICA stenoses; e. 07/2017 Carotid U/S: 1-39% bilat ICA stenoses.   Chronic heart failure with preserved ejection fraction (HFpEF) (HCC)    a. 10/2020 Echo: EF 60-65%, GrII DD; b. 11/2022 Echo: EF 55-60%, no rwma, GrII DD.   COPD (chronic obstructive pulmonary disease) (HCC)    DM2 (diabetes mellitus, type 2) (HCC)    metformin   Gallstones    Symptomatic   HLD (hyperlipidemia)    Hx of cardiovascular stress test    Myoview 5/16:  small area of inf-lat  ischemia, EF 70%; Low Risk >> try medical Rx   Hx of migraines    Hypertension    Myocardial infarction (HCC)    Obesity    Shortness of breath    Sleep apnea    sleep study2-3 yrs ago lost weight afterward but gained back   UTI (lower urinary tract infection)    freq-bladder implant -removed    Past Surgical History:  Procedure Laterality Date   ABDOMINAL HYSTERECTOMY  1977   APPENDECTOMY     AXILLARY LYMPH NODE DISSECTION  10/18/2011   Procedure: AXILLARY LYMPH NODE DISSECTION;  Surgeon: Robyne Askew, MD;  Location: [facility] OR;  Service: General;  Laterality: Left;   BREAST RECONSTRUCTION  10/18/2011   Procedure: BREAST RECONSTRUCTION;  Surgeon: Wayland Denis, DO;  Location: [facility] OR;  Service: Plastics;  Laterality: Bilateral;   bilateral breast reconstruction with bilateral tissue expander and placement of flex hd.   CARDIAC CATHETERIZATION     CESAREAN SECTION  04/1976   CHOLECYSTECTOMY N/A 08/17/2018   Procedure: LAPAROSCOPIC CHOLECYSTECTOMY;  Surgeon: Abigail Miyamoto, MD;  Location: [facility] OR;  Service: General;  Laterality: N/A;   COLONOSCOPY WITH PROPOFOL N/A 06/20/2019   Procedure: COLONOSCOPY WITH PROPOFOL;  Surgeon: Jeani Hawking, MD;  Location[facility] ENDOSCOPY;  Service: Endoscopy;  Laterality: N/A;   CORONARY ARTERY BYPASS GRAFT  12/13/2011   Procedure: CORONARY ARTERY BYPASS GRAFTING (CABG);  Surgeon: Alleen Borne, MD;  Location[facility] OR;  Service: Open Heart Surgery;  Laterality: N/A;  Times four using endoscopically harvested left greater saphenous vein and left internal mammary artery. Right greater saphenous vein attempted; not appropriate for vein harvest.   CORONARY PRESSURE/FFR STUDY N/A 08/22/2017   Procedure: INTRAVASCULAR PRESSURE WIRE/FFR STUDY;  Surgeon: Yvonne Kendall, MD;  Location: [facility] INVASIVE CV LAB;  Service: Cardiovascular;  Laterality: N/A;   EYE SURGERY     bilateral cataract extraction    LAPAROSCOPIC GASTRIC BANDING  2010   LEFT HEART CATHETERIZATION WITH  CORONARY ANGIOGRAM N/A 12/11/2011   Procedure: LEFT HEART CATHETERIZATION WITH CORONARY ANGIOGRAM;  Surgeon: Laurey Morale, MD;  Location: [facility] CATH LAB;  Service: Cardiovascular;  Laterality: N/A;   LESION REMOVAL  04/09/2012   Procedure: LESION REMOVAL;  Surgeon: Wayland Denis, DO;  Location: [facility];  Service: Plastics;  Laterality: Bilateral;   MASTECTOMY W/ SENTINEL NODE BIOPSY  10/18/2011   Procedure: MASTECTOMY WITH SENTINEL LYMPH NODE BIOPSY;  Surgeon: Robyne Askew, MD;  Location: [facility] OR;  Service: General;  Laterality: Bilateral;  bilateral mastectomy and left sentinel node biopsy   NASAL SINUS SURGERY     POLYPECTOMY  06/20/2019   Procedure: POLYPECTOMY;  Surgeon: Jeani Hawking, MD;  Location: [facility] ENDOSCOPY;  Service: Endoscopy;;   PORT-A-CATH REMOVAL  04/09/2012   Procedure: REMOVAL PORT-A-CATH;  Surgeon: Robyne Askew, MD;  Location: [facility];  Service: General;  Laterality: Right;   PORTACATH PLACEMENT  12/02/2011   Procedure: INSERTION PORT-A-CATH;  Surgeon: Robyne Askew, MD;  Location: [facility] OR;  Service: General;  Laterality: Right;   RIGHT/LEFT HEART CATH AND CORONARY/GRAFT ANGIOGRAPHY N/A 08/22/2017   Procedure: RIGHT/LEFT HEART CATH AND CORONARY/GRAFT ANGIOGRAPHY;  Surgeon: Yvonne Kendall, MD;  Location: [facility] INVASIVE CV LAB;  Service: Cardiovascular;  Laterality: N/A;   RIGHT/LEFT HEART CATH AND CORONARY/GRAFT ANGIOGRAPHY N/A 01/06/2023   Procedure: RIGHT/LEFT HEART CATH AND CORONARY/GRAFT ANGIOGRAPHY;  Surgeon: Yvonne Kendall, MD;  Location: [facility] INVASIVE CV LAB;  Service: Cardiovascular;  Laterality: N/A;   TONSILLECTOMY     US ECHOCARDIOGRAPHY     at [facility]   VIDEO BRONCHOSCOPY WITH ENDOBRONCHIAL NAVIGATION Left 11/26/2013   Procedure: VIDEO BRONCHOSCOPY WITH ENDOBRONCHIAL NAVIGATION;  Surgeon: Leslye Peer, MD;  Location: [facility] OR;  Service: Thoracic;  Laterality: Left;     Medications Prior to Admission: Prior to Admission medications    Medication Sig Start Date End Date Taking? Authorizing Provider  albuterol (PROVENTIL) (2.5 MG/3ML) 0.083% nebulizer solution Inhale 3 mLs into the lungs every 6 (six) hours as needed for wheezing or shortness of breath. 02/19/18  Yes [provider]  albuterol (VENTOLIN HFA) 108 (90 Base) MCG/ACT inhaler Inhale 1-2 puffs into the lungs every 6 (six) hours as needed for wheezing or shortness of breath.   Yes [provider]  aspirin EC 81 MG tablet Take 1 tablet (81 mg total) by mouth daily. Patient taking differently: Take 81 mg by mouth every evening. 04/26/16  Yes Weaver, Scott T, PA-C  atorvastatin (LIPITOR) 80 MG tablet TAKE 1 TABLET BY MOUTH ONCE DAILY AT  6PM 03/07/23  Yes End, Cristal Deer, MD  cephALEXin (KEFLEX) 500 MG capsule Take 500 mg by mouth in the morning. 03/05/17  Yes [provider]  Cholecalciferol (VITAMIN D3) 1000 units CAPS Take 1,000 Units by mouth in the morning.   Yes [provider]  ezetimibe (ZETIA) 10 MG tablet Take 10 mg by mouth in the morning.   Yes [provider]  fluticasone (FLONASE) 50 MCG/ACT nasal spray Place 2 sprays into both nostrils daily as needed for allergies. 08/14/13  Yes [provider]  furosemide (LASIX) 40 MG tablet Take 1 tablet (40 mg total) by mouth 2 (two) times daily. 01/06/23  Yes End, Cristal Deer, MD  isosorbide mononitrate (IMDUR) 60 MG 24 hr tablet Take 1 tablet by mouth twice daily 03/06/23  Yes End, Cristal Deer, MD  lisinopril (ZESTRIL) 5 MG tablet Take 1 tablet by mouth once daily 01/21/23  Yes Hammock, Sheri, NP  loratadine (CLARITIN) 10 MG tablet Take 10 mg by mouth every evening.   Yes [provider]  metFORMIN (GLUCOPHAGE) 500 MG tablet Take 1 tablet (500 mg total) by mouth every evening. 01/09/23  Yes End, Cristal Deer, MD  metoprolol tartrate (LOPRESSOR) 50 MG tablet Take 1 tablet (50 mg total) by mouth 2 (two) times daily. 01/06/23  Yes End, Cristal Deer, MD  Multiple Vitamin (MULTI  VITAMIN DAILY PO) Take 1 tablet by mouth in the morning.   Yes [provider]  nitroGLYCERIN (NITROSTAT) 0.4 MG SL tablet Place 1 tablet (0.4 mg total) under the tongue every 5 (five) minutes as needed. 06/22/18  Yes End, Cristal Deer, MD  Polyethyl Glycol-Propyl Glycol 0.4-0.3 % SOLN Place 1 drop into both eyes 4 (four) times daily as needed (for dry eyes).    Yes [provider]  potassium  chloride 20 MEQ TBCR Take 1 tablet (20 mEq total) by mouth daily. 01/24/23 04/24/23 Yes Creig Hines, NP     Allergies:    Allergies  Allergen Reactions   Penicillins Nausea Only and Other (See Comments)    DID THE REACTION INVOLVE: Swelling of the face/tongue/throat, SOB, or low BP? No Sudden or severe rash/hives, skin peeling, or the inside of the mouth or nose? No Did it require medical treatment? #  #  #  YES  #  #  #  When did it last happen? Unknown    Vicodin [Hydrocodone-Acetaminophen] Other (See Comments)    Syncope    Duloxetine Hcl Nausea Only    Cymbalta*   Sulfa Antibiotics Rash    Childhood Allergy    Sulfamethoxazole Rash    Social History:   Social History   Socioeconomic History   Marital status: Married    Spouse name: Not on file   Number of children: 6   Years of education: Not on file   Highest education level: Not on file  Occupational History   Occupation: retired  Tobacco Use   Smoking status: Former    Current packs/day: 0.00    Average packs/day: 2.0 packs/day for 40.0 years (80.0 ttl pk-yrs)    Types: Cigarettes    Start date: 08/25/1966    Quit date: 08/25/2006    Years since quitting: 16.5   Smokeless tobacco: Never  Vaping Use   Vaping status: Never Used  Substance and Sexual Activity   Alcohol use: Yes    Comment: 1-2 times a year   Drug use: No   Sexual activity: Not Currently  Other Topics Concern   Not on file  Social History Narrative   Not on file   Social Determinants of Health   Financial Resource Strain:  Not on file  Food Insecurity: Low Risk  (10/11/2022)   [facility]   Food vital sign    Within the past 12 months, you worried that your food would run out before you got money to buy more: Never true    Within the past 12 months, the food you bought just didn't last and you didn't have money to get more. : Never true  Transportation Needs: Not on file (10/11/2022)  Physical Activity: Not on file  Stress: Not on file  Social Connections: Not on file  Intimate Partner Violence: Not on file    Family History:   The patient's family history includes Breast cancer in her maternal aunt and maternal aunt; Heart disease in her father and mother; Ovarian cancer in her paternal aunt. There is no history of Anesthesia problems, Hypotension, Malignant hyperthermia, or Pseudochol deficiency.    ROS:  Please see the history of present illness.  All other ROS reviewed and negative.     Physical Exam/Data:   Vitals:   03/10/23 1400 03/11/23 1053  BP:  (!) 133/57  Pulse:  85  Resp:  18  Temp:  98.1 F (36.7 C)  TempSrc:  Oral  SpO2:  95%  Weight: 113 kg 113 kg  Height:  5\' 4"  (1.626 m)   No intake or output data in the 24 hours ending 03/11/23 1229    03/11/2023   10:53 AM 03/10/2023    2:00 PM 02/13/2023    4:02 PM  Last 3 Weights  Weight (lbs) 249 lb 3.2 oz 249 lb 3.2 oz 249 lb  Weight (kg) 113.036 kg 113.036 kg 112.946  kg     Body mass index is 42.77 kg/m.  General:  Well nourished, well developed, in no acute distress HEENT: normal Neck: no JVD Vascular: No carotid bruits; Distal pulses 2+ bilaterally   Cardiac:  normal S1, S2; RRR; no murmur  Lungs:  clear to auscultation bilaterally, no wheezing, rhonchi or rales  Abd: soft, nontender, no hepatomegaly  Ext: no LE edema Musculoskeletal:  No deformities, BUE and BLE strength normal and equal Skin: warm and dry  Neuro:  CNs 2-12 intact, no focal abnormalities noted Psych:  Normal affect    Relevant  CV Studies: See computer record  Laboratory Data:  High Sensitivity Troponin:  No results for input(s): "TROPONINIHS" in the last 720 hours.    Chemistry Recent Labs  Lab 03/07/23 0927  NA 139  K 3.7  CL 104  CO2 24  GLUCOSE 160*  BUN 21  CREATININE 1.20*  CALCIUM 9.1  GFRNONAA 46*  ANIONGAP 11    Recent Labs  Lab 03/07/23 0927  PROT 7.1  ALBUMIN 3.4*  AST 119*  ALT 127*  ALKPHOS 59  BILITOT 0.6   Lipids No results for input(s): "CHOL", "TRIG", "HDL", "LABVLDL", "LDLCALC", "CHOLHDL" in the last 168 hours. Hematology Recent Labs  Lab 03/07/23 0927  WBC 5.4  RBC 4.84  HGB 13.7  HCT 44.1  MCV 91.1  MCH 28.3  MCHC 31.1  RDW 13.5  PLT 167   Thyroid No results for input(s): "TSH", "FREET4" in the last 168 hours. BNPNo results for input(s): "BNP", "PROBNP" in the last 168 hours.  DDimer No results for input(s): "DDIMER" in the last 168 hours.   Radiology/Studies:  No results found.   Assessment and Plan:   Severe aortic stenosis: Will plan TAVR today with placement of a 26 mm Medtronic Evolut valve from the transfemoral approach.   Severity of Illness: The appropriate patient status for this patient is INPATIENT. Inpatient status is judged to be reasonable and necessary in order to provide the required intensity of service to ensure the patient's safety. The patient's presenting symptoms, physical exam findings, and initial radiographic and laboratory data in the context of their chronic comorbidities is felt to place them at high risk for further clinical deterioration. Furthermore, it is not anticipated that the patient will be medically stable for discharge from the hospital within 2 midnights of admission.   * I certify that at the point of admission it is my clinical judgment that the patient will require inpatient hospital care spanning beyond 2 midnights from the point of admission due to high intensity of service, high risk for further deterioration and  high frequency of surveillance required.*   For questions or updates, please contact [facility] Please consult [email or web address] for contact info under     Signed, Verne Carrow, MD  03/11/2023 12:29 PM

## 2023-03-11 NOTE — Progress Notes (Signed)
  Echocardiogram 2D Echocardiogram has been performed.  Jamie [name] 03/11/2023, 4:04 PM

## 2023-03-11 NOTE — Progress Notes (Signed)
Pt arrived from cath lab sleeping but arousable to sternal rub. Bilateral groins CDI, no swelling, bed alarm on bed as pt sleeping soundly. Frequent VS taken. Unable to orient pt to unit as she did not wake up. Asked family to refrain from giving fluids until fully awake. Pt resting with call bell within reach.  Will continue to monitor.

## 2023-03-12 ENCOUNTER — Inpatient Hospital Stay (HOSPITAL_COMMUNITY): Payer: Medicare Other

## 2023-03-12 ENCOUNTER — Encounter (HOSPITAL_COMMUNITY): Payer: Self-pay | Admitting: Cardiovascular Disease

## 2023-03-12 DIAGNOSIS — Z952 Presence of prosthetic heart valve: Secondary | ICD-10-CM

## 2023-03-12 LAB — GLUCOSE, CAPILLARY: Glucose-Capillary: 102 mg/dL — ABNORMAL HIGH (ref 70–99)

## 2023-03-12 NOTE — Discharge Instructions (Signed)

## 2023-03-12 NOTE — Progress Notes (Signed)
CARDIAC REHAB PHASE I   PRE:  Rate/Rhythm: 94 SR   BP:  Sitting: 159/45      SaO2: 96 RA   MODE:  Ambulation: 50 ft   POST:  Rate/Rhythm: 115 ST   BP:  Sitting: 183/64      SaO2: 97 RA   Pt ambulated in hallway with assistance, using front wheel walker. Pt have complaints of generalized weakness and groin tenderness. Pt maintained stable steady gait during walk. B/P elevated post walk, Clovis Riley RN  to recheck, as pt complains it hurts her arm to much to recheck now.  Returned to bed with call bell and bedside table in reach. Pt has walker and cane for home use. Post TAVR education including site care, restrictions, heart healthy diabetic diet, exercise guidelines and CRP2 reviewed. All questions and concerns addressed. Pt is not interested in CRP2 at this time.   [id number]  Woodroe Chen, RN BSN 03/12/2023 9:51 AM

## 2023-03-12 NOTE — Progress Notes (Signed)
  Echocardiogram 2D Echocardiogram has been performed.  Leda Roys RDCS 03/12/2023, 9:09 AM

## 2023-03-13 ENCOUNTER — Telehealth: Payer: Self-pay | Admitting: *Deleted

## 2023-03-13 ENCOUNTER — Telehealth: Payer: Self-pay | Admitting: Physician Assistant

## 2023-03-13 NOTE — Telephone Encounter (Signed)
   Pre-operative Risk Assessment    Patient Name: [name]  DOB: 12-06-1945 MRN: [id number]      Request for Surgical Clearance    Procedure:  Dental Extraction - Amount of Teeth to be Pulled:  5 TEETH SURGICALLY EXTRACTED  Date of Surgery:  Clearance TBD                                 Surgeon:  DR. Sheilah Mins, DDS Surgeon's Group or Practice Name:  [facility] Phone number:  [phone]  Fax number:  [phone]   Type of Clearance Requested:   - Medical ; ASA    Type of Anesthesia:   CONSCIOUS SEDATION WITH IV MIDAZOLAM AND FENTANYL    Additional requests/questions:    Elpidio Anis   03/13/2023, 4:44 PM

## 2023-03-13 NOTE — Telephone Encounter (Addendum)
  [facility]   MULTIDISCIPLINARY HEART VALVE TEAM   Patient contacted regarding discharge from [facility] on 03/12/23  Patient understands to follow up with a structural heart APP on 8/14 at [address] [facility].  Patient understands discharge instructions? yes Patient understands medications and regimen? yes Patient understands to bring all medications to this visit? Yes  Just feels weak all over. Hasn't done much. Feeling a bit better than yesterday.  Cline Crock PA-C  [facility]

## 2023-03-14 NOTE — Telephone Encounter (Signed)
I will address during my office visit next week, but the patient is cleared for dental extractions after 04/11/23 with appropriate SBE prophylaxis.

## 2023-03-14 NOTE — Telephone Encounter (Signed)
Carlean Jews, PA with Heart Valve Department will address this.

## 2023-03-17 NOTE — Telephone Encounter (Signed)
Office is calling for update. Please advise 

## 2023-03-17 NOTE — Telephone Encounter (Signed)
See notes from Carlean Jews, [facility] states she will address clearance at the appt with the pt. Pt has appt 03/19/23. Once the pt has been cleared she will fax her ov notes providing clearance and any medications that need to be held. I will update all parties involved.

## 2023-03-19 ENCOUNTER — Ambulatory Visit: Payer: Medicare Other | Attending: Internal Medicine | Admitting: Physician Assistant

## 2023-03-19 ENCOUNTER — Ambulatory Visit: Payer: Medicare Other

## 2023-03-19 VITALS — BP 123/52 | HR 75 | Ht 65.0 in | Wt 240.0 lb

## 2023-03-19 DIAGNOSIS — Z952 Presence of prosthetic heart valve: Secondary | ICD-10-CM | POA: Insufficient documentation

## 2023-03-19 DIAGNOSIS — I5032 Chronic diastolic (congestive) heart failure: Secondary | ICD-10-CM | POA: Diagnosis present

## 2023-03-19 DIAGNOSIS — R531 Weakness: Secondary | ICD-10-CM | POA: Insufficient documentation

## 2023-03-19 DIAGNOSIS — I10 Essential (primary) hypertension: Secondary | ICD-10-CM | POA: Insufficient documentation

## 2023-03-19 DIAGNOSIS — R7989 Other specified abnormal findings of blood chemistry: Secondary | ICD-10-CM | POA: Diagnosis present

## 2023-03-19 DIAGNOSIS — I25118 Atherosclerotic heart disease of native coronary artery with other forms of angina pectoris: Secondary | ICD-10-CM | POA: Diagnosis present

## 2023-03-19 DIAGNOSIS — N289 Disorder of kidney and ureter, unspecified: Secondary | ICD-10-CM | POA: Diagnosis present

## 2023-03-19 DIAGNOSIS — K089 Disorder of teeth and supporting structures, unspecified: Secondary | ICD-10-CM | POA: Insufficient documentation

## 2023-03-19 MED ORDER — AZITHROMYCIN 500 MG PO TABS: ORAL_TABLET | ORAL | 6 refills | Status: AC

## 2023-03-19 NOTE — Progress Notes (Signed)
[facility]ENTER   MULTIDISCIPLINARY HEART VALVE CLINIC                                     Cardiology Office Note:    Date:  03/19/2023   ID:  Jamie [name], DOB Nov 09, 1945, MRN [id number]  PCP:  Raynelle Jan., MD  [facility] Cardiologist:  Yvonne Kendall, MD  / Dr. Clifton James & Dr. Leafy Ro (TAVR)  [facility] Electrophysiologist:  None   Referring MD: Raynelle Jan., MD   [facility] s/p TAVR  History of Present Illness:    Jamie [name] is a 77 y.o. female with a hx of CAD s/p CABGx4V (2013 by Dr. Laneta Simmers), HLD, DMT2, morbid obesity (BMI 42), OSA, COPD, carotid artery disease, breast CA s/p bilateral mastectomies, chronic diastolic CHF, and severe aortic stenosis s/p TAVR (8/6/4) who presents to clinic for follow up.  She previously suffered an NSTEMI in 2013 with diagnostic catheterization revealing severe multivessel disease, she underwent CABG x4. Stress testing was performed in May 2016 with finding of a small area of inferolateral ischemia. She was medically managed. She underwent repeat catheterization in 2019 in the setting of dyspnea on exertion and this revealed an atretic LIMA to the LAD, patent vein graft to the diagonal with an occluded jump portion of the graft to the OM 2, and a patent vein graft to the RCA. She had a 70% ostial left main and 50% proximal LAD stenosis. FFR was measured through these stenoses, and was normal at 0.82. She was placed on isosorbide therapy. She has also been followed over time for known aortic stenosis. Most recent echo 11/27/22 showed EF 55-60%, G2DD, and moderate AS with a mean gradient of 24 mm hg, peak gradient 45.7 mm hg, AVA 1.28cm2, DVI 0.5, SVI 45.    Seen in the office in May 2024 for worsening DOE, LE edema and arm discomfort. Initially PET stress was planned but converted to a heart cath given accelerating symptoms. [name] 01/06/23 showed stable, multivessel native coronary artery disease with patent vein graft to the distal RCA,  patent vein graft to the D1 with known occlusion of the continuation to the OM 2, and atretic LIMA to the LAD. Overall, coronary anatomy felt to be stable. Filling pressures were elevated with a wedge of 20 and PA 43/20 with normal CO/CI. Additionally, she was also noted to have normal LV function and severe AS with a mean gradient of 56 mmHg and calculated valve area of 0.7 cm. In that setting, lasix increased to 40mg  BID. Metoprolol increased and nitrates reduced.    Had Covid at the end of July and started on Paxlovid.   She was evaluated by the multidisciplinary valve team and underwent successful TAVR with a 26 mm Medtronic Evolut FX THV via the TF approach on 03/11/23. Post operative echo showed EF 70%, normally functioning TAVR with a mean gradient of 5.6 mmHg and no PVL as well as moderate MAC with mild to moderate MS. She was continued on home Asprin 81 mg daily.   Today the patient presents to clinic for follow up. Here with his grand-daughter. Doing better but still very weak. She has noticed a slight improvement in her breathing but still short of breath. Wonders if she really needs to have dental extractions. No CP. No LE edema, orthopnea or PND. No dizziness or syncope. No blood in stool or  urine. No palpitations.      Past Medical History:  Diagnosis Date   Anxiety    Aortic stenosis    a. 12/2014 Echo:  EF 60-65%, no RWMA, Gr 1 DD, Ao sclerosis, mild AS, mean 14 mmHg, MAC; b. 10/2020 Echo: EF 60-65%, no rwma, GrII DD, nl RV fxn, mildly dil LA, Mild-mod AS; c. 11/2022 Echo: EF 55-60%, mod AS (AVA 1.28cm^2); d. Cath: Mean gradient , AVA 0.7 cm^2.   Asthma    Breast cancer (HCC) 08/2011   lt. breast ca   CAD (coronary artery disease)    a. 12/2011 NSTEMI/Cath: Sev 3VD; b. 12/2011 CABG x4: LIMA-LAD, SVG-RCA, SVG-D1-->OM2; c. 08/2017 Cath: Sev native dzs, LIMA->LAD atretic, VG->D1 ok w/ 100 @ continuation to OM2, VG->dRCA ok-->Med rx; c. 01/2023 Cath: LM 70ost, LAD 50p, LCX nl, RCA 151m,  VG->dRCA nl, VG->D1 ok w/ 100% continuation to OM2, LIMA->LAD atretic-->overall unchanged->Med Rx.   Carotid artery disease (HCC)    a.  Pre-CABG Dopplers 5/13: Bilateral 40-59%; b.  Carotid US 6/16: RICA 40-59%, LICA 60-79%, normal subclavians bilaterally;  c. Carotid US 11/16: RICA 40-59%; LICA 40-59%, normal subclavians; d. Carotid US 11/17:  Stable 40-59% bilateral ICA stenoses; e. 07/2017 Carotid U/S: 1-39% bilat ICA stenoses.   Chronic heart failure with preserved ejection fraction (HFpEF) (HCC)    a. 10/2020 Echo: EF 60-65%, GrII DD; b. 11/2022 Echo: EF 55-60%, no rwma, GrII DD.   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 Clifton James & Dr. Leafy Ro.   Shortness of breath    Sleep apnea    sleep study2-3 yrs ago lost weight afterward but gained back   UTI (lower urinary tract infection)    freq-bladder implant -removed    Past Surgical History:  Procedure Laterality Date   ABDOMINAL HYSTERECTOMY  1977   APPENDECTOMY     AXILLARY LYMPH NODE DISSECTION  10/18/2011   Procedure: AXILLARY LYMPH NODE DISSECTION;  Surgeon: Robyne Askew, MD;  Location: [facility] OR;  Service: General;  Laterality: Left;   BREAST RECONSTRUCTION  10/18/2011   Procedure: BREAST RECONSTRUCTION;  Surgeon: Wayland Denis, DO;  Location: [facility] OR;  Service: Plastics;  Laterality: Bilateral;   bilateral breast reconstruction with bilateral tissue expander and placement of flex hd.   CARDIAC CATHETERIZATION     CESAREAN SECTION  04/1976   CHOLECYSTECTOMY N/A 08/17/2018   Procedure:  LAPAROSCOPIC CHOLECYSTECTOMY;  Surgeon: Abigail Miyamoto, MD;  Location[facility] OR;  Service: General;  Laterality: N/A;   COLONOSCOPY WITH PROPOFOL N/A 06/20/2019   Procedure: COLONOSCOPY WITH PROPOFOL;  Surgeon: Jeani Hawking, MD;  Location: [facility] ENDOSCOPY;  Service: Endoscopy;  Laterality: N/A;   CORONARY ARTERY BYPASS GRAFT  12/13/2011   Procedure: CORONARY ARTERY BYPASS GRAFTING (CABG);  Surgeon: Alleen Borne, MD;  Location: [facility] OR;  Service: Open Heart Surgery;  Laterality: N/A;  Times four using endoscopically harvested left greater saphenous vein and left internal mammary artery. Right greater  saphenous vein attempted; not appropriate for vein harvest.   CORONARY PRESSURE/FFR STUDY N/A 08/22/2017   Procedure: INTRAVASCULAR PRESSURE WIRE/FFR STUDY;  Surgeon: Yvonne Kendall, MD;  Location: [facility] INVASIVE CV LAB;  Service: Cardiovascular;  Laterality: N/A;   EYE SURGERY     bilateral cataract extraction    INTRAOPERATIVE TRANSTHORACIC ECHOCARDIOGRAM N/A 03/11/2023   Procedure: INTRAOPERATIVE TRANSTHORACIC ECHOCARDIOGRAM;  Surgeon: Kathleene Hazel, MD;  Location: [facility] INVASIVE CV LAB;  Service: Open Heart Surgery;  Laterality: N/A;   LAPAROSCOPIC GASTRIC BANDING  2010   LEFT HEART CATHETERIZATION WITH CORONARY ANGIOGRAM N/A 12/11/2011   Procedure: LEFT HEART CATHETERIZATION WITH CORONARY ANGIOGRAM;  Surgeon: Laurey Morale, MD;  Location: [facility] CATH LAB;  Service: Cardiovascular;  Laterality: N/A;   LESION REMOVAL  04/09/2012   Procedure: LESION REMOVAL;  Surgeon: Wayland Denis, DO;  Location: [facility];  Service: Plastics;  Laterality: Bilateral;   MASTECTOMY W/ SENTINEL NODE BIOPSY  10/18/2011   Procedure: MASTECTOMY WITH SENTINEL LYMPH NODE BIOPSY;  Surgeon: Robyne Askew, MD;  Location: [facility] OR;  Service: General;  Laterality: Bilateral;  bilateral mastectomy and left sentinel node biopsy   NASAL SINUS SURGERY     POLYPECTOMY  06/20/2019   Procedure: POLYPECTOMY;  Surgeon: Jeani Hawking, MD;  Location: [facility] ENDOSCOPY;  Service: Endoscopy;;   PORT-A-CATH REMOVAL  04/09/2012   Procedure: REMOVAL PORT-A-CATH;  Surgeon: Robyne Askew, MD;  Location: [facility];  Service: General;  Laterality: Right;   PORTACATH PLACEMENT  12/02/2011   Procedure: INSERTION PORT-A-CATH;  Surgeon: Robyne Askew, MD;  Location: [facility] OR;  Service: General;  Laterality: Right;   RIGHT/LEFT HEART CATH AND CORONARY/GRAFT ANGIOGRAPHY N/A 08/22/2017   Procedure: RIGHT/LEFT HEART CATH AND CORONARY/GRAFT ANGIOGRAPHY;  Surgeon: Yvonne Kendall, MD;  Location: [facility] INVASIVE CV LAB;  Service: Cardiovascular;  Laterality: N/A;   RIGHT/LEFT HEART CATH AND CORONARY/GRAFT ANGIOGRAPHY N/A 01/06/2023   Procedure: RIGHT/LEFT HEART CATH AND CORONARY/GRAFT ANGIOGRAPHY;  Surgeon: Yvonne Kendall, MD;  Location: [facility] INVASIVE CV LAB;  Service: Cardiovascular;  Laterality: N/A;   TONSILLECTOMY     TRANSCATHETER AORTIC VALVE REPLACEMENT, TRANSFEMORAL N/A 03/11/2023   Procedure: Transcatheter Aortic Valve Replacement, Transfemoral;  Surgeon: Kathleene Hazel, MD;  Location: [facility] INVASIVE CV LAB;  Service: Open Heart Surgery;  Laterality: N/A;   US ECHOCARDIOGRAPHY     at [facility]   VIDEO BRONCHOSCOPY WITH ENDOBRONCHIAL NAVIGATION Left 11/26/2013   Procedure: VIDEO BRONCHOSCOPY WITH ENDOBRONCHIAL NAVIGATION;  Surgeon: Leslye Peer, MD;  Location: [facility] OR;  Service: Thoracic;  Laterality: Left;    Current Medications: Current Meds  Medication Sig   albuterol (PROVENTIL) (2.5 MG/3ML) 0.083% nebulizer solution Inhale 3 mLs into the lungs every 6 (six) hours as needed for wheezing or shortness of breath.   albuterol (VENTOLIN HFA) 108 (90 Base) MCG/ACT inhaler Inhale 1-2 puffs into the lungs every 6 (six) hours as needed for wheezing or shortness of breath.   aspirin EC 81 MG tablet Take 1 tablet (81 mg total) by mouth daily.   atorvastatin (LIPITOR) 80 MG tablet TAKE 1 TABLET BY MOUTH ONCE DAILY AT  6PM    azithromycin (ZITHROMAX) 500 MG tablet Take 1 tablet by mouth 1 hour before dental procedures and cleanings   Cholecalciferol (VITAMIN D3) 1000 units CAPS Take 1,000 Units by mouth in the morning.   ezetimibe (ZETIA) 10 MG tablet Take 10 mg by mouth in the morning.   fluticasone (FLONASE) 50 MCG/ACT nasal spray  Place 2 sprays into both nostrils daily as needed for allergies.   furosemide (LASIX) 40 MG tablet Take 1 tablet (40 mg total) by mouth 2 (two) times daily.   isosorbide mononitrate (IMDUR) 60 MG 24 hr tablet Take 1 tablet by mouth twice daily   lisinopril (ZESTRIL) 5 MG tablet Take 1 tablet by mouth once daily   loratadine (CLARITIN) 10 MG tablet Take 10 mg by mouth every evening.   metFORMIN (GLUCOPHAGE) 500 MG tablet Take 1 tablet (500 mg total) by mouth every evening.   metoprolol tartrate (LOPRESSOR) 50 MG tablet Take 1 tablet (50 mg total) by mouth 2 (two) times daily.   Multiple Vitamin (MULTI VITAMIN DAILY PO) Take 1 tablet by mouth in the morning.   nitroGLYCERIN (NITROSTAT) 0.4 MG SL tablet Place 1 tablet (0.4 mg total) under the tongue every 5 (five) minutes as needed.   Polyethyl Glycol-Propyl Glycol 0.4-0.3 % SOLN Place 1 drop into both eyes 4 (four) times daily as needed (for dry eyes).    potassium chloride 20 MEQ TBCR Take 1 tablet (20 mEq total) by mouth daily.     Allergies:   Penicillins, Vicodin [hydrocodone-acetaminophen], Duloxetine hcl, Sulfa antibiotics, and Sulfamethoxazole   Social History   Socioeconomic History   Marital status: Married    Spouse name: Not on file   Number of children: 6   Years of education: Not on file   Highest education level: Not on file  Occupational History   Occupation: retired  Tobacco Use   Smoking status: Former    Current packs/day: 0.00    Average packs/day: 2.0 packs/day for 40.0 years (80.0 ttl pk-yrs)    Types: Cigarettes    Start date: 08/25/1966    Quit date: 08/25/2006    Years since quitting: 16.5   Smokeless  tobacco: Never  Vaping Use   Vaping status: Never Used  Substance and Sexual Activity   Alcohol use: Yes    Comment: 1-2 times a year   Drug use: No   Sexual activity: Not Currently  Other Topics Concern   Not on file  Social History Narrative   Not on file   Social Determinants of Health   Financial Resource Strain: Not on file  Food Insecurity: Low Risk  (10/11/2022)   [facility]   Food vital sign    Within the past 12 months, you worried that your food would run out before you got money to buy more: Never true    Within the past 12 months, the food you bought just didn't last and you didn't have money to get more. : Never true  Transportation Needs: Not on file (10/11/2022)  Physical Activity: Not on file  Stress: Not on file  Social Connections: Not on file     Family History: The patient's family history includes Breast cancer in her maternal aunt and maternal aunt; Heart disease in her father and mother; Ovarian cancer in her paternal aunt. There is no history of Anesthesia problems, Hypotension, Malignant hyperthermia, or Pseudochol deficiency.  ROS:   Please see the history of present illness.    All other systems reviewed and are negative.  EKGs/Labs/Other Studies Reviewed:    Cardiac Studies & Procedures   CARDIAC CATHETERIZATION  CARDIAC CATHETERIZATION 01/06/2023  Narrative Conclusions: Severe ostial LMCA disease and chronic total occlusion of mid RCA.  There is also moderate disease of mid LAD.  Overall appearance is similar to prior catheterization in 2019. Widely patent  SVG-D1 with chronic total occlusion of sequential portion to OM. Widely patent SVG-RCA. Small LIMA-LAD that has partial retrograde filling from the LAD (known to be atretic by prior cath). Mildly elevated left and right heart filling pressures. Normal Fick cardiac output/index. Severe aortic valve stenosis (cannot exclude some element of subvalvular/LVOT  gradient).  Recommendations: Increase diuresis. Increase metoprolol and reduce isosorbide mononitrate. Aggressive secondary prevention of coronary artery disease. Consider referral to Dr. McAlhany/structural heart team for further evaluation/management of aortic stenosis if symptoms do not improve at follow-up.  Yvonne Kendall, MD [facility]  Findings Coronary Findings Diagnostic  Dominance: Right  Left Main Vessel is large. Ost LM lesion is 70% stenosed.  Left Anterior Descending Vessel is large. Prox LAD lesion is 50% stenosed.  First Diagonal Branch Vessel is moderate in size.  Second Diagonal Branch Vessel is small in size.  Left Circumflex Vessel is moderate in size. Vessel is angiographically normal.  First Obtuse Marginal Branch Vessel is small in size.  Second Obtuse Marginal Branch Vessel is moderate in size.  Right Coronary Artery Vessel is moderate in size. Mid RCA lesion is 100% stenosed. The lesion is chronically occluded.  Right Posterior Descending Artery Vessel is moderate in size. Vessel is angiographically normal.  Right Posterior Atrioventricular Artery Vessel is large in size. Vessel is angiographically normal.  Saphenous Graft To Dist RCA SVG and is large.  The graft exhibits no disease.  Sequential Saphenous Graft To 1st Diag, 2nd Mrg SVG and is large. Origin to Insertion lesion between 1st Diag and 2nd Mrg  is 100% stenosed. The lesion is chronically occluded.  LIMA LIMA Graft To Mid LAD LIMA and is small.  The graft is atretic.  There is competitive flow.  Intervention  No interventions have been documented.   CARDIAC CATHETERIZATION  CARDIAC CATHETERIZATION 08/22/2017  Narrative Conclusions: 1. Severe native coronary artery disease, including 70% ostial LMCA, 50% proximal LAD, and 100% mid RCA lesions. 2. Atretic LIMA->LAD. 3. Patent SVG->D1; jump portion to OM2 appears chronically occluded. 4. Widely patent  SVG->RCA. 5. FFR of ostial LMCA and proximal LAD lesions is not significant (FFR 0.82), though this may be influenced by some retrograde from via the SVG->D. 6. Mildly elevated left heart, right heart, and pulmonary artery pressures. 7. Normal Fick cardiac output/index. 8. Mild aortic stenosis.  Recommendations: 1. Aggressive medical therapy. I will start isosorbide mononitrate 30 mg daily, which should be escalated as tolerated. If the patient is intolerant of isosorbide mononitrate of has incomplete relief of symptoms, addition of ranolazine and pulmonary consultation should be considered. 2. If marked dyspnea persists despite maximal antianginal therapy, adequate diuresis, weight loss, and pulmonary evaluation, PCI to the LMCA could be considered. 3. Continue secondary prevention, including high-intensity statin therapy and diabetes control.  Yvonne Kendall, MD [facility] Pager: [phone]  Findings Coronary Findings Diagnostic  Dominance: Right  Left Main Vessel was injected. Vessel is large. Ost LM lesion is 70% stenosed. Pressure wire/FFR was performed on the lesion. FFR: 0.82.  Left Anterior Descending Vessel was injected. Vessel is large. Prox LAD lesion is 50% stenosed. Pressure wire/FFR was performed on the lesion. FFR: 0.82.  First Diagonal Branch Vessel is moderate in size.  Second Diagonal Branch Vessel is small in size.  Left Circumflex Vessel was injected. Vessel is moderate in size. Vessel is angiographically normal.  First Obtuse Marginal Branch Vessel is small in size.  Second Obtuse Marginal Branch Vessel is moderate in size.  Right Coronary Artery Vessel was  injected. Vessel is moderate in size. Mid RCA lesion is 100% stenosed. The lesion is chronically occluded.  Right Posterior Descending Artery Vessel is moderate in size. Vessel is angiographically normal.  Right Posterior Atrioventricular Artery Vessel is large in size. Vessel is  angiographically normal.  Saphenous Graft To Dist RCA SVG graft was visualized by angiography and is large. The graft exhibits no disease.  Sequential Saphenous Graft To 1st Diag, 2nd Mrg SVG graft was visualized by angiography and is large. Origin to Insertion lesion between 1st Diag and 2nd Mrg  is 100% stenosed. The lesion is chronically occluded.  LIMA LIMA Graft To Mid LAD LIMA graft was visualized by angiography. The graft is atretic.  There is competitive flow.  Intervention  No interventions have been documented.   STRESS TESTS  MYOCARDIAL PERFUSION IMAGING 12/14/2014  Narrative  Small in size, mild in severity perfusion defect in the basal inferolateral wall is mostly reversible and may represent ischemia in the left circumflex artery territory.  Low risk pharmacological stress nuclear study with a small area of mild basal inferolateral ischemia and normal left ventricular regional and global systolic function.   ECHOCARDIOGRAM  ECHOCARDIOGRAM COMPLETE 03/12/2023  Narrative ECHOCARDIOGRAM REPORT    Patient Name:   Jamie [name] Date of Exam: 03/12/2023 Medical Rec #:  [id number]     Height:       64.0 in Accession #:    [id number]    Weight:       249.2 lb Date of Birth:  [birth year]     BSA:          2.148 m Patient Age:    77 years      BP:           159/45 mmHg Patient Gender: F             HR:           93 bpm. Exam Location:  Inpatient  Procedure: 2D Echo, Color Doppler and Cardiac Doppler  Indications:    Post TAVR evaluation V43.3 / Z95.2  History:        Patient has prior history of Echocardiogram examinations, most recent 03/12/2023. CAD, COPD; Risk Factors:Hypertension, Diabetes and Dyslipidemia. Aortic Valve: 26 mm Medtronic CoreValve-EvolutR prosthetic, stented (TAVR) valve is present in the aortic position.  Sonographer:    Harriette Bouillon RDCS Referring Phys: Janetta Hora  IMPRESSIONS   1. Left ventricular ejection fraction, by estimation,  is 70 to 75%. The left ventricle has hyperdynamic function. The left ventricle has no regional wall motion abnormalities. Left ventricular diastolic parameters are consistent with Grade I diastolic dysfunction (impaired relaxation). Elevated left atrial pressure. 2. Right ventricular systolic function is hyperdynamic. The right ventricular size is normal. Tricuspid regurgitation signal is inadequate for assessing PA pressure. 3. Left atrial size was mild to moderately dilated. 4. The mitral valve is degenerative. No evidence of mitral valve regurgitation. Mild mitral stenosis. The mean mitral valve gradient is 7.1 mmHg with average heart rate of 91 bpm. Moderate to severe mitral annular calcification. 5. The aortic valve has been repaired/replaced. Aortic valve regurgitation is not visualized. There is a 26 mm Medtronic CoreValve-EvolutR prosthetic (TAVR) valve present in the aortic position. Echo findings are consistent with normal structure and function of the aortic valve prosthesis. Aortic valve mean gradient measures 5.6 mmHg. Aortic valve Vmax measures 1.52 m/s. Aortic valve acceleration time measures 94 msec. 6. The inferior vena cava is normal in size with greater than 50%  respiratory variability, suggesting right atrial pressure of 3 mmHg.  FINDINGS Left Ventricle: Left ventricular ejection fraction, by estimation, is 70 to 75%. The left ventricle has hyperdynamic function. The left ventricle has no regional wall motion abnormalities. The left ventricular internal cavity size was normal in size. There is no left ventricular hypertrophy. Left ventricular diastolic parameters are consistent with Grade I diastolic dysfunction (impaired relaxation). Elevated left atrial pressure.  Right Ventricle: The right ventricular size is normal. No increase in right ventricular wall thickness. Right ventricular systolic function is hyperdynamic. Tricuspid regurgitation signal is inadequate for assessing PA  pressure.  Left Atrium: Left atrial size was mild to moderately dilated.  Right [facility]: Right atrial size was normal in size.  Pericardium: There is no evidence of pericardial effusion.  Mitral Valve: The mitral valve is degenerative in appearance. Moderate to severe mitral annular calcification. No evidence of mitral valve regurgitation. Mild mitral valve stenosis. The mean mitral valve gradient is 7.1 mmHg with average heart rate of 91 bpm.  Tricuspid Valve: The tricuspid valve is normal in structure. Tricuspid valve regurgitation is not demonstrated.  Aortic Valve: The aortic valve has been repaired/replaced. Aortic valve regurgitation is not visualized. Aortic valve mean gradient measures 5.6 mmHg. Aortic valve peak gradient measures 9.3 mmHg. Aortic valve area, by VTI measures 1.53 cm. There is a 26 mm Medtronic CoreValve-EvolutR prosthetic, stented (TAVR) valve present in the aortic position. Echo findings are consistent with normal structure and function of the aortic valve prosthesis.  Pulmonic Valve: The pulmonic valve was not well visualized. Pulmonic valve regurgitation is not visualized. No evidence of pulmonic stenosis.  Aorta: The aortic root and ascending aorta are structurally normal, with no evidence of dilitation.  Venous: The inferior vena cava is normal in size with greater than 50% respiratory variability, suggesting right atrial pressure of 3 mmHg.  IAS/Shunts: The interatrial septum was not well visualized.   LEFT VENTRICLE PLAX 2D LVIDd:         3.90 cm   Diastology LVIDs:         2.80 cm   LV e' lateral:   5.77 cm/s LV PW:         1.10 cm   LV E/e' lateral: 18.2 LV IVS:        1.00 cm LVOT diam:     1.80 cm LV SV:         42 LV SV Index:   20 LVOT Area:     2.54 cm   IVC IVC diam: 1.80 cm  LEFT ATRIUM           Index LA diam:      3.90 cm 1.82 cm/m LA Vol (A4C): 64.3 ml 29.93 ml/m AORTIC VALVE AV Area (Vmax):    1.80 cm AV Area (Vmean):    1.62 cm AV Area (VTI):     1.53 cm AV Vmax:           152.40 cm/s AV Vmean:          110.200 cm/s AV VTI:            0.278 m AV Peak Grad:      9.3 mmHg AV Mean Grad:      5.6 mmHg LVOT Vmax:         108.00 cm/s LVOT Vmean:        70.350 cm/s LVOT VTI:          0.167 m LVOT/AV VTI ratio: 0.60  AORTA Ao Root  diam: 1.80 cm Ao Asc diam:  1.80 cm  MITRAL VALVE MV Area (PHT): 1.55 cm     SHUNTS MV Mean grad:  7.1 mmHg     Systemic VTI:  0.17 m MV Decel Time: 490 msec     Systemic Diam: 1.80 cm MV E velocity: 104.97 cm/s MV A velocity: 144.26 cm/s MV E/A ratio:  0.73  Mihai Croitoru MD Electronically signed by Thurmon Fair MD Signature Date/Time: 03/12/2023/12:47:13 PM    Final     CT SCANS  CT CORONARY MORPH W/CTA COR W/SCORE 02/04/2023  Addendum 02/07/2023  5:18 PM ADDENDUM REPORT: 02/07/2023 17:16  CLINICAL DATA:  Aortic valve replacement (TAVR), pre-op eval  EXAM: Cardiac TAVR CT  TECHNIQUE: The patient was scanned on a Siemens Force 192 slice scanner. A 120 kV retrospective scan was triggered in the descending thoracic aorta at 111 HU's. Gantry rotation speed was 270 msecs and collimation was .9 mm. The 3D data set was reconstructed in 5% intervals of the R-R cycle. Systolic and diastolic phases were analyzed on a dedicated work station using MPR, MIP and VRT modes. The patient received OMNIPAQUE IOHEXOL 350 MG/ML SOLN of contrast.  FINDINGS: Aortic Valve:  Tricuspid aortic valve with severely reduced cusp excursion. Severely thickened and severely calcified aortic valve cusps.  AV calcium score: 1206  Virtual Basal Annulus Measurements: 25%  Maximum/Minimum Diameter: 22.1 x 18.8 mm  Perimeter: 64.1 mm  Area:  320 mm2  LVOT calcification below RCC/LCC commissure.  Membranous septal length: 5.5 mm  Based on these measurements, the annulus would be suitable for a 20 mm Sapien 3 valve. Alternatively, Heart Team can consider 26 mm Evolut  valve. Recommend Heart Team discussion for valve selection.  Sinus of Valsalva Measurements:  Non-coronary:  28 mm  Right - coronary:  29 mm  Left - coronary:  29 mm  Sinus of Valsalva Height:  Left: 19.5 mm  Right: 19.9 mm  Aorta: Conventional 3 vessel branch pattern of aortic arch. Severe aortic atherosclerosis, and atherosclerosis of the proximal left subclavian.  Sinotubular Junction:  23 mm  Ascending Thoracic Aorta:  27 mm  Aortic Arch:  23 mm  Coronary Artery Height above Annulus:  Left main: 12.2 mm  Right coronary: 16.5 mm  Coronary Arteries: Normal coronary origin. Right dominance. The study was performed without use of NTG and insufficient for plaque evaluation. Prior CABG, see coronary angiography 01/06/23 for details of anatomy.  Optimum Fluoroscopic Angle for Delivery: RAO 5 CRA 8  OTHER:  Left atrial appendage: No thrombus.  Mitral valve: Degenerative with moderate mitral annular calcifications.  Pulmonary artery: Dilated branch PAs.  Pulmonary veins: Normal anatomy.  IMPRESSION: 1. Tricuspid aortic valve with severely reduced cusp excursion. Severely thickened and severely calcified aortic valve cusps. 2. Aortic valve calcium score: 1206 3. Annulus area: 320 mm2, suitable for 20 mm Sapien 3 valve vs 26 mm Evolut valve. LVOT calcifications below RCC/LCC commissure. Membranous septal length 5.5 mm. Left ventricular hypertrophy narrows the LVOT. 4. Sufficient coronary artery heights from annulus. 5. Optimum fluoroscopic angle for delivery: RAO 5 CRA 8   Electronically Signed By: Weston Brass M.D. On: 02/07/2023 17:16  Narrative EXAM: OVER-READ INTERPRETATION  CT CHEST  The following report is a limited chest CT over-read performed by radiologist Dr. Allegra Lai of [facility] on 02/04/2023. This over-read does not include interpretation of cardiac or coronary anatomy or pathology. The cardiac TAVR interpretation  by the cardiologist is attached.  COMPARISON:  None Available.  FINDINGS: Extracardiac findings will be described separately under dictation for contemporaneously obtained CTA chest, abdomen and pelvis.  IMPRESSION: Please see separate dictation for contemporaneously obtained CTA chest, abdomen and pelvis dated 02/04/2023 for full description of relevant extracardiac findings.  Electronically Signed: By: Allegra Lai M.D. On: 02/04/2023 12:49          EKG:  EKG is ordered today.  The ekg ordered today demonstrates NSR with inferior and anterior q waves noted. HR 75  Recent Labs: 03/07/2023: ALT 127 03/12/2023: BUN 22; Creatinine, Ser 1.16; Hemoglobin 12.7; Magnesium 2.0; Platelets 106; Potassium 4.3; Sodium 138  Recent Lipid Panel    Component Value Date/Time   CHOL 131 03/30/2019 1145   TRIG 192 (H) 03/30/2019 1145   HDL 42 03/30/2019 1145   CHOLHDL 3.1 03/30/2019 1145   CHOLHDL 3 03/10/2012 1015   VLDL 36.4 03/10/2012 1015   LDLCALC 51 03/30/2019 1145     Risk Assessment/Calculations:            Physical Exam:    VS:  BP (!) 123/52   Pulse 75   Ht 5\' 5"  (1.651 m)   Wt 240 lb (108.9 kg)   SpO2 95%   BMI 39.94 kg/m     Wt Readings from Last 3 Encounters:  03/19/23 240 lb (108.9 kg)  03/11/23 249 lb 3.2 oz (113 kg)  02/13/23 249 lb (112.9 kg)     GEN:  Well nourished, well developed in no acute distress, obese HEENT: Normal NECK: No JVD LYMPHATICS: No lymphadenopathy CARDIAC: RRR, no murmurs, rubs, gallops RESPIRATORY:  Clear to auscultation without rales, wheezing or rhonchi  ABDOMEN: Soft, non-tender, non-distended MUSCULOSKELETAL:  No edema; No deformity  SKIN: Warm and dry.  Groin sites clear without hematoma or ecchymosis  NEUROLOGIC:  Alert and oriented x 3 PSYCHIATRIC:  Normal affect   ASSESSMENT:    1. S/P TAVR (transcatheter aortic valve replacement)   2. Chronic heart failure with preserved ejection fraction (HFpEF) (HCC)   3.  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 Essential hypertension   5. Elevated liver function tests   6. Morbid obesity (HCC)   7. Poor dentition   8. Renal lesion    PLAN:    In order of problems listed above:  Severe AS s/p TAVR: doing well 1 week out from TAVR. She is slowly getting better but still weak. ECG with no HAVB. Groin sites healing well. Continue on home Asprin 81 mg daily. SBE prophylaxis discussed; I have RX'd azithromycin due to a PCN allergy. I will see her back for 1 month follow up and echo.    Chronic HFpEF: appears euvolemic. Continue on Lasix 40mg  BID and Potassium 20 meq daily.  CMET today.    CAD s/p CABG x4V: recent cath revealed overall stable anatomy. Medical therapy recommended. Continue aspirin, statin, beta-blocker, nitrate.   HTN: BP well controlled. No changes made.    Elevated LFTS: noted on PAT labs. CMET today   Morbid obesity: Body mass index is 42.78 kg/m. May be a good GLP1 agonist candidate at some point.   Poor dentition: she will require 5 extractions. Will push this out at least 1 month out from TAVR and make sure she uses SBE prophylaxis. I have Rx'd azithromycin. She is cleared for dental extractions under conscious sedation after 04/11/23.   Renal lesion: pre TAVR CTs showed an "indeterminate exophytic lesion  of the lower pole the right kidney measuring 9 mm. Recommend further evaluation with contrast-enhanced renal protocol MRI or CT." Will get this ordered today. Labs today.   Weakness: still weak. I think this is multifactorial due to deconditioning, weight and recent covid. I have recommended slowly increasing her exercise and working on diet. She declines cardiac rehab.   Medication Adjustments/Labs and Tests Ordered: Current medicines are reviewed at length with the patient today.  Concerns regarding medicines are outlined above.  Orders Placed This Encounter   Procedures   CT RENAL ABDOMEN W WO CONTRAST   Comprehensive metabolic panel   EKG 12-Lead   Meds ordered this encounter  Medications   azithromycin (ZITHROMAX) 500 MG tablet    Sig: Take 1 tablet by mouth 1 hour before dental procedures and cleanings    Dispense:  12 tablet    Refill:  6    Patient Instructions  Medication Instructions:  Start Azithromycin 500 mg, take 1 tablet by mouth 1 hour before dental procedures and cleanings   *If you need a refill on your cardiac medications before your next appointment, please call your pharmacy*   Lab Work: CMP- today   If you have labs (blood work) drawn today and your tests are completely normal, you will receive your results only by: MyChart Message (if you have MyChart) OR A paper copy in the mail If you have any lab test that is abnormal or we need to change your treatment, we will call you to review the results.   Testing/Procedures: Your physician has requested that you have a CT of your abdomen/ kidneys    Follow-Up: Follow up as scheduled   Other Instructions     Signed, Cline Crock, PA-C  03/19/2023 6:01 PM    [facility]

## 2023-03-19 NOTE — Patient Instructions (Signed)
Medication Instructions:  Start Azithromycin 500 mg, take 1 tablet by mouth 1 hour before dental procedures and cleanings   *If you need a refill on your cardiac medications before your next appointment, please call your pharmacy*   Lab Work: CMP- today   If you have labs (blood work) drawn today and your tests are completely normal, you will receive your results only by: MyChart Message (if you have MyChart) OR A paper copy in the mail If you have any lab test that is abnormal or we need to change your treatment, we will call you to review the results.   Testing/Procedures: Your physician has requested that you have a CT of your abdomen/ kidneys    Follow-Up: Follow up as scheduled   Other Instructions

## 2023-03-20 LAB — COMPREHENSIVE METABOLIC PANEL
ALT: 32 IU/L (ref 0–32)
AST: 51 IU/L — ABNORMAL HIGH (ref 0–40)
Albumin: 4.1 g/dL (ref 3.8–4.8)
Alkaline Phosphatase: 77 IU/L (ref 44–121)
BUN/Creatinine Ratio: 14 (ref 12–28)
BUN: 18 mg/dL (ref 8–27)
Bilirubin Total: 0.7 mg/dL (ref 0.0–1.2)
CO2: 28 mmol/L (ref 20–29)
Calcium: 9.8 mg/dL (ref 8.7–10.3)
Chloride: 102 mmol/L (ref 96–106)
Creatinine, Ser: 1.29 mg/dL — ABNORMAL HIGH (ref 0.57–1.00)
Globulin, Total: 3 g/dL (ref 1.5–4.5)
Glucose: 118 mg/dL — ABNORMAL HIGH (ref 70–99)
Potassium: 4.6 mmol/L (ref 3.5–5.2)
Sodium: 144 mmol/L (ref 134–144)
Total Protein: 7.1 g/dL (ref 6.0–8.5)
eGFR: 42 mL/min/{1.73_m2} — ABNORMAL LOW (ref >59)

## 2023-03-20 NOTE — Progress Notes (Signed)
Per Cline Crock, PAC to fax notes to dental office. Please see recommendations for the pt for dental procedure.

## 2023-03-26 ENCOUNTER — Telehealth: Payer: Self-pay | Admitting: Internal Medicine

## 2023-03-26 NOTE — Telephone Encounter (Signed)
Returned pts called. Pt was unsure of appointment time.

## 2023-03-26 NOTE — Telephone Encounter (Signed)
Pt wanted to know about procedure on Friday. Please advise.

## 2023-03-28 ENCOUNTER — Ambulatory Visit (HOSPITAL_COMMUNITY)
Admission: RE | Admit: 2023-03-28 | Discharge: 2023-03-28 | Disposition: A | Discharge status: Home / Self Care | Payer: Medicare Other | Source: Ambulatory Visit | Attending: Physician Assistant | Admitting: Physician Assistant

## 2023-03-28 ENCOUNTER — Ambulatory Visit: Payer: Medicare Other | Admitting: Internal Medicine

## 2023-03-28 DIAGNOSIS — N289 Disorder of kidney and ureter, unspecified: Secondary | ICD-10-CM | POA: Diagnosis present

## 2023-03-28 MED ORDER — IOHEXOL 350 MG/ML SOLN
100.0000 mL | Freq: Once | INTRAVENOUS | Status: AC | PRN
  Administered 2023-03-28: 100 mL via INTRAVENOUS

## 2023-04-04 ENCOUNTER — Other Ambulatory Visit (HOSPITAL_COMMUNITY): Payer: Medicare Other

## 2023-04-04 ENCOUNTER — Ambulatory Visit: Payer: Medicare Other | Admitting: Nurse Practitioner

## 2023-04-11 ENCOUNTER — Ambulatory Visit (HOSPITAL_COMMUNITY): Payer: Medicare Other | Attending: Cardiology

## 2023-04-11 ENCOUNTER — Ambulatory Visit (INDEPENDENT_AMBULATORY_CARE_PROVIDER_SITE_OTHER): Payer: Medicare Other

## 2023-04-11 ENCOUNTER — Telehealth: Payer: Self-pay | Admitting: Internal Medicine

## 2023-04-11 VITALS — BP 126/78 | HR 52 | Ht 65.0 in | Wt 242.0 lb

## 2023-04-11 DIAGNOSIS — R531 Weakness: Secondary | ICD-10-CM | POA: Diagnosis present

## 2023-04-11 DIAGNOSIS — R7989 Other specified abnormal findings of blood chemistry: Secondary | ICD-10-CM | POA: Insufficient documentation

## 2023-04-11 DIAGNOSIS — K089 Disorder of teeth and supporting structures, unspecified: Secondary | ICD-10-CM | POA: Diagnosis present

## 2023-04-11 DIAGNOSIS — I5032 Chronic diastolic (congestive) heart failure: Secondary | ICD-10-CM | POA: Diagnosis not present

## 2023-04-11 DIAGNOSIS — Z952 Presence of prosthetic heart valve: Secondary | ICD-10-CM | POA: Diagnosis not present

## 2023-04-11 DIAGNOSIS — N289 Disorder of kidney and ureter, unspecified: Secondary | ICD-10-CM | POA: Insufficient documentation

## 2023-04-11 DIAGNOSIS — I10 Essential (primary) hypertension: Secondary | ICD-10-CM | POA: Insufficient documentation

## 2023-04-11 DIAGNOSIS — I25118 Atherosclerotic heart disease of native coronary artery with other forms of angina pectoris: Secondary | ICD-10-CM

## 2023-04-11 LAB — ECHOCARDIOGRAM COMPLETE
AR max vel: 5.24 cm2
AV Area VTI: 5.15 cm2
AV Area mean vel: 4.84 cm2
AV Mean grad: 5.4 mmHg
AV Peak grad: 9 mmHg
Ao pk vel: 1.5 m/s
Area-P 1/2: 1.87 cm2
S' Lateral: 2.7 cm

## 2023-04-11 MED ORDER — PERFLUTREN LIPID MICROSPHERE
1.0000 mL | INTRAVENOUS | Status: AC | PRN
Start: 2023-04-11 — End: 2023-04-11
  Administered 2023-04-11: 4 mL via INTRAVENOUS

## 2023-04-11 MED ORDER — FUROSEMIDE 40 MG PO TABS: 40.0000 mg | ORAL_TABLET | Freq: Every morning | ORAL | Status: DC

## 2023-04-11 NOTE — Progress Notes (Unsigned)
[facility]ENTER   MULTIDISCIPLINARY HEART VALVE CLINIC                                     Cardiology Office Note:    Date:  04/14/2023   ID:  Jamie [name], DOB [birth year], MRN [id number]  PCP:  Raynelle Jan., MD  [facility] Cardiologist:  Yvonne Kendall, MD  / Dr. Clifton James & Dr. Leafy Ro (TAVR)  [facility] Electrophysiologist:  None   Referring MD: Raynelle Jan., MD   1 month s/p TAVR  History of Present Illness:    Jamie [name] is a 77 y.o. female with a hx of CAD s/p CABGx4V (2013 by Dr. Laneta Simmers), HLD, DMT2, morbid obesity (BMI 42), OSA, COPD, carotid artery disease, breast CA s/p bilateral mastectomies, chronic diastolic CHF, and severe aortic stenosis s/p TAVR (8/6/4) who presents to clinic for follow up.  She previously suffered an NSTEMI in 2013 with diagnostic catheterization revealing severe multivessel disease, she underwent CABG x4. Stress testing was performed in May 2016 with finding of a small area of inferolateral ischemia. She was medically managed. She underwent repeat catheterization in 2019 in the setting of dyspnea on exertion and this revealed an atretic LIMA to the LAD, patent vein graft to the diagonal with an occluded jump portion of the graft to the OM 2, and a patent vein graft to the RCA. She had a 70% ostial left main and 50% proximal LAD stenosis. FFR was measured through these stenoses, and was normal at 0.82. She was placed on isosorbide therapy.   She has also been followed over time for known aortic stenosis. Echo 11/27/22 showed EF 55-60% and moderate AS. Given accelerating symptoms of dyspnea and volume overload she underwent L/RHC on 01/06/23 which showed stable, multivessel native coronary artery disease with patent vein graft to the distal RCA, patent vein graft to the D1 with known occlusion of the continuation to the OM 2, and atretic LIMA to the LAD. Overall, coronary anatomy felt to be stable, but filling pressures were elevated and  aortic valve mean gradient 56 mmHg.  She was evaluated by the multidisciplinary valve team and now s/p TAVR with a 26 mm Medtronic Evolut FX THV via the TF approach on 03/11/23. Post operative echo showed EF 70%, normally functioning TAVR with a mean gradient of 5.6 mmHg and no PVL as well as moderate MAC with mild to moderate MS. She was continued on home Asprin 81 mg daily.   Today the patient presents to clinic for follow up. Here alone today. No CP or SOB. No LE edema, orthopnea or PND. Has gone down on her diuretic dose and legs are the "skinniest they have ever been." No dizziness or syncope. No blood in stool or urine. No palpitations. She is slowly doing better but still just tired and wishes she had more energy. Anxious about getting dental extractions.    Past Medical History:  Diagnosis Date   Anxiety    Aortic stenosis    a. 12/2014 Echo:  EF 60-65%, no RWMA, Gr 1 DD, Ao sclerosis, mild AS, mean 14 mmHg, MAC; b. 10/2020 Echo: EF 60-65%, no rwma, GrII DD, nl RV fxn, mildly dil LA, Mild-mod AS; c. 11/2022 Echo: EF 55-60%, mod AS (AVA 1.28cm^2); d. Cath: Mean gradient , AVA 0.7 cm^2.   Asthma    Breast cancer (HCC) 08/2011   lt.  breast ca   CAD (coronary artery disease)    a. 12/2011 NSTEMI/Cath: Sev 3VD; b. 12/2011 CABG x4: LIMA-LAD, SVG-RCA, SVG-D1-->OM2; c. 08/2017 Cath: Sev native dzs, LIMA->LAD atretic, VG->D1 ok w/ 100 @ continuation to OM2, VG->dRCA ok-->Med rx; c. 01/2023 Cath: LM 70ost, LAD 50p, LCX nl, RCA 198m, VG->dRCA nl, VG->D1 ok w/ 100% continuation to OM2, LIMA->LAD atretic-->overall unchanged->Med Rx.   Carotid artery disease (HCC)    a.  Pre-CABG Dopplers 5/13: Bilateral 40-59%; b.  Carotid US 6/16: RICA 40-59%, LICA 60-79%, normal subclavians bilaterally;  c. Carotid US 11/16: RICA 40-59%; LICA 40-59%, normal subclavians; d. Carotid US 11/17:  Stable 40-59% bilateral ICA stenoses; e. 07/2017 Carotid U/S: 1-39% bilat ICA stenoses.   Chronic heart failure with preserved  ejection fraction (HFpEF) (HCC)    a. 10/2020 Echo: EF 60-65%, GrII DD; b. 11/2022 Echo: EF 55-60%, no rwma, GrII DD.   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 Clifton James & Dr. Leafy Ro.   Shortness of breath    Sleep apnea    sleep study2-3 yrs ago lost weight afterward but gained back   UTI (lower urinary tract infection)    freq-bladder implant -removed    Past Surgical History:  Procedure Laterality Date   ABDOMINAL HYSTERECTOMY  1977   APPENDECTOMY     AXILLARY LYMPH NODE DISSECTION  10/18/2011   Procedure: AXILLARY LYMPH NODE DISSECTION;  Surgeon: Robyne Askew, MD;  Location: [facility] OR;  Service: General;  Laterality: Left;   BREAST RECONSTRUCTION  10/18/2011   Procedure: BREAST RECONSTRUCTION;  Surgeon: Wayland Denis, DO;  Location: [facility] OR;  Service: Plastics;  Laterality: Bilateral;   bilateral breast reconstruction with bilateral tissue expander and placement of flex hd.   CARDIAC CATHETERIZATION     CESAREAN SECTION  04/1976   CHOLECYSTECTOMY N/A 08/17/2018   Procedure: LAPAROSCOPIC CHOLECYSTECTOMY;  Surgeon: Abigail Miyamoto, MD;  Location: [facility] OR;  Service: General;  Laterality: N/A;   COLONOSCOPY WITH PROPOFOL N/A 06/20/2019   Procedure: COLONOSCOPY WITH PROPOFOL;  Surgeon: Jeani Hawking, MD;  Location: [facility] ENDOSCOPY;  Service: Endoscopy;  Laterality: N/A;   CORONARY ARTERY BYPASS GRAFT  12/13/2011   Procedure: CORONARY ARTERY BYPASS GRAFTING (CABG);  Surgeon: Alleen Borne, MD;  Location: [facility] OR;  Service: Open Heart Surgery;  Laterality:  N/A;  Times four using endoscopically harvested left greater saphenous vein and left internal mammary artery. Right greater saphenous vein attempted; not appropriate for vein harvest.   CORONARY PRESSURE/FFR STUDY N/A 08/22/2017   Procedure: INTRAVASCULAR PRESSURE WIRE/FFR STUDY;  Surgeon: Yvonne Kendall, MD;  [facility]: [facility] INVASIVE CV LAB;  Service: Cardiovascular;  Laterality: N/A;   EYE SURGERY     bilateral cataract extraction    INTRAOPERATIVE TRANSTHORACIC ECHOCARDIOGRAM N/A 03/11/2023   Procedure: INTRAOPERATIVE TRANSTHORACIC ECHOCARDIOGRAM;  Surgeon: Kathleene Hazel, MD;  [facility]: [facility] INVASIVE CV LAB;  Service: Open Heart Surgery;  Laterality: N/A;   LAPAROSCOPIC GASTRIC BANDING  2010  LEFT HEART CATHETERIZATION WITH CORONARY ANGIOGRAM N/A 12/11/2011   Procedure: LEFT HEART CATHETERIZATION WITH CORONARY ANGIOGRAM;  Surgeon: Laurey Morale, MD;  Location: [facility] CATH LAB;  Service: Cardiovascular;  Laterality: N/A;   LESION REMOVAL  04/09/2012   Procedure: LESION REMOVAL;  Surgeon: Wayland Denis, DO;  Location: [facility];  Service: Plastics;  Laterality: Bilateral;   MASTECTOMY W/ SENTINEL NODE BIOPSY  10/18/2011   Procedure: MASTECTOMY WITH SENTINEL LYMPH NODE BIOPSY;  Surgeon: Robyne Askew, MD;  Location: [facility] OR;  Service: General;  Laterality: Bilateral;  bilateral mastectomy and left sentinel node biopsy   NASAL SINUS SURGERY     POLYPECTOMY  06/20/2019   Procedure: POLYPECTOMY;  Surgeon: Jeani Hawking, MD;  Location: [facility] ENDOSCOPY;  Service: Endoscopy;;   PORT-A-CATH REMOVAL  04/09/2012   Procedure: REMOVAL PORT-A-CATH;  Surgeon: Robyne Askew, MD;  Location: [facility];  Service: General;  Laterality: Right;   PORTACATH PLACEMENT  12/02/2011   Procedure: INSERTION PORT-A-CATH;  Surgeon: Robyne Askew, MD;  Location: [facility] OR;  Service: General;  Laterality: Right;   RIGHT/LEFT HEART CATH AND CORONARY/GRAFT ANGIOGRAPHY N/A 08/22/2017   Procedure:  RIGHT/LEFT HEART CATH AND CORONARY/GRAFT ANGIOGRAPHY;  Surgeon: Yvonne Kendall, MD;  Location: [facility] INVASIVE CV LAB;  Service: Cardiovascular;  Laterality: N/A;   RIGHT/LEFT HEART CATH AND CORONARY/GRAFT ANGIOGRAPHY N/A 01/06/2023   Procedure: RIGHT/LEFT HEART CATH AND CORONARY/GRAFT ANGIOGRAPHY;  Surgeon: Yvonne Kendall, MD;  Location: [facility] INVAS[facility] CV LAB;  Service: Cardiovascular;  Laterality: N/A;   TONSILLECTOMY     TRANSCATHETER AORTIC VALVE REPLACEMENT, TRANSFEMORAL N/A 03/11/2023   Procedure: Transcatheter Aortic Valve Replacement, Transfemoral;  Surgeon: Kathleene Hazel, MD;  Location: [facility] INVASIVE CV LAB;  Service: Open Heart Surgery;  Laterality: N/A;   US ECHOCARDIOGRAPHY     at [facility]   VIDEO BRONCHOSCOPY WITH ENDOBRONCHIAL NAVIGATION Left 11/26/2013   Procedure: VIDEO BRONCHOSCOPY WITH ENDOBRONCHIAL NAVIGATION;  Surgeon: Leslye Peer, MD;  Location: [facility] OR;  Service: Thoracic;  Laterality: Left;    Current Medications: Current Meds  Medication Sig   albuterol (PROVENTIL) (2.5 MG/3ML) 0.083% nebulizer solution Inhale 3 mLs into the lungs every 6 (six) hours as needed for wheezing or shortness of breath.   albuterol (VENTOLIN HFA) 108 (90 Base) MCG/ACT inhaler Inhale 1-2 puffs into the lungs every 6 (six) hours as needed for wheezing or shortness of breath.   aspirin EC 81 MG tablet Take 1 tablet (81 mg total) by mouth daily.   atorvastatin (LIPITOR) 80 MG tablet TAKE 1 TABLET BY MOUTH ONCE DAILY AT  6PM   azithromycin (ZITHROMAX) 500 MG tablet Take 1 tablet by mouth 1 hour before dental procedures and cleanings   Cholecalciferol (VITAMIN D3) 1000 units CAPS Take 1,000 Units by mouth in the morning.   ezetimibe (ZETIA) 10 MG tablet Take 10 mg by mouth in the morning.   fluticasone (FLONASE) 50 MCG/ACT nasal spray Place 2 sprays into both nostrils daily as needed for allergies.   isosorbide mononitrate (IMDUR) 60 MG 24 hr tablet Take 1 tablet by mouth twice daily    lisinopril (ZESTRIL) 5 MG tablet Take 1 tablet by mouth once daily   loratadine (CLARITIN) 10 MG tablet Take 10 mg by mouth every evening.   metFORMIN (GLUCOPHAGE) 500 MG tablet Take 1 tablet (500 mg total) by mouth every evening.   metoprolol tartrate (LOPRESSOR) 50 MG tablet Take 1 tablet (50 mg total) by mouth 2 (two) times daily.  Multiple Vitamin (MULTI VITAMIN DAILY PO) Take 1 tablet by mouth in the morning.   nitroGLYCERIN (NITROSTAT) 0.4 MG SL tablet Place 1 tablet (0.4 mg total) under the tongue every 5 (five) minutes as needed.   Polyethyl Glycol-Propyl Glycol 0.4-0.3 % SOLN Place 1 drop into both eyes 4 (four) times daily as needed (for dry eyes).    [DISCONTINUED] furosemide (LASIX) 40 MG tablet Take 1 tablet (40 mg total) by mouth 2 (two) times daily.   [DISCONTINUED] potassium chloride 20 MEQ TBCR Take 1 tablet (20 mEq total) by mouth daily.     Allergies:   Penicillins, Vicodin [hydrocodone-acetaminophen], Duloxetine hcl, Sulfa antibiotics, and Sulfamethoxazole   Social History   Socioeconomic History   Marital status: Married    Spouse name: Not on file   Number of children: 6   Years of education: Not on file   Highest education level: Not on file  Occupational History   Occupation: retired  Tobacco Use   Smoking status: Former    Current packs/day: 0.00    Average packs/day: 2.0 packs/day for 40.0 years (80.0 ttl pk-yrs)    Types: Cigarettes    Start date: 08/25/1966    Quit date: 08/25/2006    Years since quitting: 16.6   Smokeless tobacco: Never  Vaping Use   Vaping status: Never Used  Substance and Sexual Activity   Alcohol use: Yes    Comment: 1-2 times a year   Drug use: No   Sexual activity: Not Currently  Other Topics Concern   Not on file  Social History Narrative   Not on file   Social Determinants of Health   Financial Resource Strain: Not on file  Food Insecurity: Low Risk  (10/11/2022)   [facility]   Hunger  Vital Sign    Worried About Running Out of Food in the Last Year: Never true    Ran Out of Food in the Last Year: Never true  Transportation Needs: Not on file (10/11/2022)  Physical Activity: Not on file  Stress: Not on file  Social Connections: Not on file     Family History: The patient's family history includes Breast cancer in her maternal aunt and maternal aunt; Heart disease in her father and mother; Ovarian cancer in her paternal aunt. There is no history of Anesthesia problems, Hypotension, Malignant hyperthermia, or Pseudochol deficiency.  ROS:   Please see the history of present illness.    All other systems reviewed and are negative.  EKGs/Labs/Other Studies Reviewed:    EKG:  EKG is NOT ordered today.    Recent Labs: 03/12/2023: Hemoglobin 12.7; Magnesium 2.0; Platelets 106 03/19/2023: ALT 32; BUN 18; Creatinine, Ser 1.29; Potassium 4.6; Sodium 144  Recent Lipid Panel    Component Value Date/Time   CHOL 131 03/30/2019 1145   TRIG 192 (H) 03/30/2019 1145   HDL 42 03/30/2019 1145   CHOLHDL 3.1 03/30/2019 1145   CHOLHDL 3 03/10/2012 1015   VLDL 36.4 03/10/2012 1015   LDLCALC 51 03/30/2019 1145     Risk Assessment/Calculations:            Physical Exam:    VS:  BP 126/78   Pulse (!) 52   Ht 5\' 5"  (1.651 m)   Wt 242 lb (109.8 kg)   SpO2 97%   BMI 40.27 kg/m     Wt Readings from Last 3 Encounters:  04/11/23 242 lb (109.8 kg)  03/19/23 240 lb (108.9 kg)  03/11/23 249  lb 3.2 oz (113 kg)     GEN:  Well nourished, well developed in no acute distress, obese HEENT: Normal NECK: No JVD LYMPHATICS: No lymphadenopathy CARDIAC: RRR, no murmurs, rubs, gallops RESPIRATORY:  Clear to auscultation without rales, wheezing or rhonchi  ABDOMEN: Soft, non-tender, non-distended MUSCULOSKELETAL:  No edema; No deformity  SKIN: Warm and dry.  NEUROLOGIC:  Alert and oriented x 3 PSYCHIATRIC:  Normal affect   ASSESSMENT:    1. S/P TAVR (transcatheter aortic valve  replacement)   2. Chronic heart failure with preserved ejection fraction (HFpEF) (HCC)   3. 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 Essential hypertension   5. Elevated liver function tests   6. Morbid obesity (HCC)   7. Poor dentition   8. Renal lesion   9. Weakness     PLAN:    In order of problems listed above:  Severe AS s/p TAVR: echo today shows EF 60%, normally functioning TAVR with a mean gradient of 5.4 mm hg and no PVL. She NYHA class II; mostly of fatigue. Continue on Asprin 81 mg daily. She has azithromycin for SBE prophylaxis. I will see her back for 1 year follow up and echo.    Chronic HFpEF: appears euvolemic. She has been able to come down on her diuretic dosing since TAVR. She was previously on Lasix 40mg  BID but only taking 20mg  BID. Will consolidate her lasix to 40mg  daily and stop potassium supplement. Recheck a BMET in 2-3 weeks.    CAD s/p CABG x4V: recent cath revealed overall stable anatomy. Medical therapy recommended. Continue aspirin 81mg  daily, atorvastatin 80mg  daily, Lopressor 50mg  BID and imdur 60mg  daily.   HTN: BP well controlled. No changes made today.    Elevated LFTS: follow up labs showed up normalization.   Morbid obesity: Body mass index is 42.78 kg/m. May be a good GLP1 agonist candidate at some point.   Poor dentition: scheduled for extractions 9/10. She will take her azithromycin 1 hour prior.    Renal lesion: noted on pre TAVR CTs. Follow up CT showed cysts with no follow up required.   Weakness: weakness improving somewhat. Still has some ongoing fatigue. I think this is multifactorial due to deconditioning, weight and recent covid. I have recommended slowly increasing her exercise and working on diet. She declines cardiac rehab.   Medication Adjustments/Labs and Tests Ordered: Current medicines are reviewed at length with the patient today.  Concerns  regarding medicines are outlined above.  Orders Placed This Encounter  Procedures   Basic Metabolic Panel (BMET)   ECHOCARDIOGRAM COMPLETE   Meds ordered this encounter  Medications   furosemide (LASIX) 40 MG tablet    Sig: Take 1 tablet (40 mg total) by mouth in the morning.    Patient Instructions  Medication Instructions:   CHANGE Lasix one (1) tablet by mouth ( 40 mg) every am.   DISCONTINUE Potassium.  *If you need a refill on your cardiac medications before your next appointment, please call your pharmacy*   Lab Work:   You may also go to any of these [facility] locations:   [facility] - [address] ([facility]) - [address] [phone] [address]   [place] - [address]    [employer]  - [address]    [place] - [address]  [address] - [address] [employer]  - [address] - [address] ([facility]    If you have labs (blood work) drawn today and your tests are completely normal, you will receive your results only by: [employer] (if you have MyChart) OR A paper copy in the mail If you have any lab test that is abnormal or we need to change your treatment, we will call you to review the results.   Testing/Procedures:  None ordered.   Follow-Up: At [facility], you and your health needs are our priority.  As part of our continuing mission to provide you with exceptional heart care, we have created designated Provider Care Teams.  These Care Teams include your primary Cardiologist (physician) and Advanced Practice Providers (APPs -  Physician Assistants and Nurse Practitioners) who all work together to provide you with the care you need, when you need it.  We recommend signing up for the patient portal called "MyChart".  Sign up information is provided on this After Visit Summary.  MyChart is used to connect with  patients for Virtual Visits (Telemedicine).  Patients are able to view lab/test results, encounter notes, upcoming appointments, etc.  Non-urgent messages can be sent to your provider as well.   To learn more about what you can do with MyChart, go to [email or web address].    Your next appointment:   1 year(s)  Provider:   Carlean Jews, PA-C       Signed, Cline Crock, PA-C  04/14/2023 9:23 AM    [facility]

## 2023-04-11 NOTE — Telephone Encounter (Signed)
Attemtped to reach office. They close at 1pm on Friday. Will leave for triage to attempt to call Monday

## 2023-04-11 NOTE — Telephone Encounter (Signed)
Amari from Oral Surgery is calling with questions about the patient's medication list. Please advise.

## 2023-04-11 NOTE — Patient Instructions (Signed)
Medication Instructions:   CHANGE Lasix one (1) tablet by mouth ( 40 mg) every am.   DISCONTINUE Potassium.  *If you need a refill on your cardiac medications before your next appointment, please call your pharmacy*   Lab Work:   You may also go to any of these [facility] locations:   [facility] - [address] ([facility]) - [address] [phone] [address]   [place] - [address]    [place]  - [address]    [place] - [address] - [address] [employer]  - [address] - [address] ([facility]    If you have labs (blood work) drawn today and your tests are completely normal, you will receive your results only by: [employer] (if you have MyChart) OR A paper copy in the mail If you have any lab test that is abnormal or we need to change your treatment, we will call you to review the results.   Testing/Procedures:  None ordered.   Follow-Up: At [facility], you and your health needs are our priority.  As part of our continuing mission to provide you with exceptional heart care, we have created designated Provider Care Teams.  These Care Teams include your primary Cardiologist (physician) and Advanced Practice Providers (APPs -  Physician Assistants and Nurse Practitioners) who all work together to provide you with the care you need, when you need it.  We recommend signing up for the patient portal called "MyChart".  Sign up information is provided on this After Visit Summary.  MyChart is used to connect with patients for Virtual Visits (Telemedicine).  Patients are able to view lab/test results, encounter notes, upcoming appointments, etc.  Non-urgent messages can be sent to your provider as well.   To learn more about what you can do with MyChart, go to [email or web address].    Your next appointment:   1  year(s)  Provider:   Carlean Jews, PA-C

## 2023-04-14 NOTE — Telephone Encounter (Signed)
Returned call to [facility] who states that pt was seen there for oral surgery consult and she was unsure of her medications and dosages. She did call the patient once she had time to get home, but was unable to get ahold of her. She's requesting we fax a current medication list to them at fax # [phone]. Printed and faxed at this time.

## 2023-06-06 HISTORY — PX: VALVE REPLACEMENT: SUR13

## 2023-06-15 NOTE — Progress Notes (Unsigned)
Cardiology Office Note:  .   Date:  06/16/2023  ID:  Jamie [name], DOB 30-Sep-1945, MRN [id number] PCP: Raynelle Jan., MD  [facility] Providers Cardiologist:  Yvonne Kendall, MD     History of Present Illness: .   Jamie [name] is a 77 y.o. female with history of coronary artery disease status post CABG (2013), diastolic heart failure, aortic stenosis status post TAVR (03/2023), hyperlipidemia, carotid artery stenosis, breast cancer status post bilateral mastectomies, diabetes mellitus, COPD, sleep apnea, and obesity who presents for follow-up of coronary artery disease and aortic stenosis.  She underwent right and left heart catheterization in June due to worsening shortness of breath.  Coronary anatomy appeared stable though severe aortic valve gradient was discovered despite echocardiogram suggesting only moderate stenosis.  She subsequently underwent TAVR and reported significant improvement in her shortness of breath at follow-up.  She still complained of some fatigue.  Today, [name] reports that she feels significantly better than before her TAVR.  It took her a while to recover, as her valve replacement was preceded by COVID-19 infection.  She also felt washed out in the setting of aggressive diuresis.  She has minimal shortness of breath and denies chest pain, palpitations, and edema (other than some localized foot swelling after recent trauma).  She has intermittent orthostatic lightheadedness but has not passed out.  She was recently placed on medications for peripheral neuropathy and feels like this has helped her legs.  She is not exercising regularly and declined cardiac rehabilitation, [place]  She is in the process of writing a romance novel, which she has wanted to do for many years.  ROS: See HPI  Studies Reviewed: Marland Kitchen   EKG Interpretation Date/Time:  Monday June 16 2023 10:58:54 EST Ventricular Rate:  54 PR Interval:  144 QRS  Duration:  88 QT Interval:  450 QTC Calculation: 426 R Axis:   10  Text Interpretation: Sinus bradycardia Inferior infarct (cited on or before 16-Jun-2023) Abnormal ECG When compared with ECG of 19-Mar-2023 15:12, HEART RATE has decreased ECG OTHERWISE WITHIN NORMAL LIMITS Confirmed by Nathan Stallworth [phone]) on 06/16/2023 1:16:55 PM    TTE (04/11/2023): Normal LV size and wall thickness.  LVEF 60-65% with normal wall motion.  Diastolic parameters are indeterminate.  Normal RV size and function.  Normal biatrial size.  No pericardial effusion.  Mild mitral annular calcification without mitral regurgitation.  Trivial TR.  TAVR valve in place with appropriate parameters (mean gradient 5.4 mmHg).  Normal CVP.  Risk Assessment/Calculations:             Physical Exam:   VS:  BP 130/62 (BP Location: Right Wrist, Patient Position: Sitting, Cuff Size: Normal)   Pulse (!) 54   Ht 5\' 5"  (1.651 m)   Wt 243 lb 6.4 oz (110.4 kg)   SpO2 97%   BMI 40.50 kg/m    Wt Readings from Last 3 Encounters:  06/16/23 243 lb 6.4 oz (110.4 kg)  04/11/23 242 lb (109.8 kg)  03/19/23 240 lb (108.9 kg)    General:  NAD. Neck: No gross JVD, though body habitus limits evaluation. Lungs: Clear to auscultation bilaterally without wheezes or crackles. Heart: Bradycardic but regular with 1/6 systolic murmur.  No rubs or gallops. Abdomen: Soft, nontender, nondistended. Extremities: No lower extremity edema.  ASSESSMENT AND PLAN: .    Severe aortic stenosis status post TAVR: [name] has recovered well from her TAVR this summer.  Continue aspirin 81 mg daily as well as SBE prophylaxis with azithromycin.  Coronary artery disease with stable angina: No chest pain reported.  Chronic exertional dyspnea, which has been an anginal equivalent in the past, has improved significantly with AVR.  Continue current antianginal regimen consisting of metoprolol tartrate and isosorbide mononitrate.  Continue secondary prevention  with aspirin, atorvastatin, and ezetimibe.  Chronic HFpEF: [name] appears euvolemic.  Functional status difficult to assess due to her sedentary lifestyle, though symptoms most consistent with NYHA class II heart failure.  Continue furosemide 40 mg daily.  Hypertension: Blood pressure borderline elevated today.  Defer medication changes, continuing isosorbide mononitrate, lisinopril, and metoprolol.  Hyperlipidemia associated with type 2 diabetes mellitus: Continue atorvastatin and ezetimibe.  Plan to repeat lipid panel with next labs drawn through our practice or PCP.  Ongoing management of DM per Dr. Carolyne Fiscal.  Morbid obesity: BMI remains greater than 40.  I encouraged [name] increase her activity in an effort to help lose weight and improve her physical conditioning.    Dispo: Return to clinic in 6 months.  Signed, Yvonne Kendall, MD

## 2023-06-16 ENCOUNTER — Encounter: Payer: Self-pay | Admitting: Internal Medicine

## 2023-06-16 ENCOUNTER — Ambulatory Visit: Payer: Medicare Other | Attending: Internal Medicine | Admitting: Internal Medicine

## 2023-06-16 VITALS — BP 130/62 | HR 54 | Ht 65.0 in | Wt 243.4 lb

## 2023-06-16 DIAGNOSIS — I25118 Atherosclerotic heart disease of native coronary artery with other forms of angina pectoris: Secondary | ICD-10-CM | POA: Diagnosis not present

## 2023-06-16 DIAGNOSIS — Z952 Presence of prosthetic heart valve: Secondary | ICD-10-CM | POA: Diagnosis present

## 2023-06-16 DIAGNOSIS — I10 Essential (primary) hypertension: Secondary | ICD-10-CM | POA: Diagnosis present

## 2023-06-16 DIAGNOSIS — E785 Hyperlipidemia, unspecified: Secondary | ICD-10-CM | POA: Diagnosis present

## 2023-06-16 DIAGNOSIS — I5032 Chronic diastolic (congestive) heart failure: Secondary | ICD-10-CM | POA: Diagnosis present

## 2023-06-16 DIAGNOSIS — E1169 Type 2 diabetes mellitus with other specified complication: Secondary | ICD-10-CM

## 2023-06-16 DIAGNOSIS — I350 Nonrheumatic aortic (valve) stenosis: Secondary | ICD-10-CM

## 2023-06-16 NOTE — Patient Instructions (Signed)
Medication Instructions:  Your physician recommends that you continue on your current medications as directed. Please refer to the Current Medication list given to you today.   *If you need a refill on your cardiac medications before your next appointment, please call your pharmacy*   Lab Work: No labs ordered today    Testing/Procedures: No test ordered today    Follow-Up: At [facility], you and your health needs are our priority.  As part of our continuing mission to provide you with exceptional heart care, we have created designated Provider Care Teams.  These Care Teams include your primary Cardiologist (physician) and Advanced Practice Providers (APPs -  Physician Assistants and Nurse Practitioners) who all work together to provide you with the care you need, when you need it.  We recommend signing up for the patient portal called "MyChart".  Sign up information is provided on this After Visit Summary.  MyChart is used to connect with patients for Virtual Visits (Telemedicine).  Patients are able to view lab/test results, encounter notes, upcoming appointments, etc.  Non-urgent messages can be sent to your provider as well.   To learn more about what you can do with MyChart, go to [email or web address].    Your next appointment:   6 month(s)  Provider:   You may see Yvonne Kendall, MD or one of the following Advanced Practice Providers on your designated Care Team:   Nicolasa Ducking, NP Eula Listen, PA-C Cadence Fransico Michael, PA-C Charlsie Quest, NP Carlos Levering, NP

## 2023-08-09 ENCOUNTER — Other Ambulatory Visit: Payer: Self-pay | Admitting: Internal Medicine

## 2023-09-22 ENCOUNTER — Other Ambulatory Visit: Payer: Self-pay | Admitting: Internal Medicine

## 2023-10-06 ENCOUNTER — Encounter (HOSPITAL_BASED_OUTPATIENT_CLINIC_OR_DEPARTMENT_OTHER): Payer: Self-pay | Admitting: *Deleted

## 2023-10-06 ENCOUNTER — Other Ambulatory Visit: Payer: Self-pay

## 2023-10-06 ENCOUNTER — Emergency Department (HOSPITAL_BASED_OUTPATIENT_CLINIC_OR_DEPARTMENT_OTHER)
Admission: EM | Admit: 2023-10-06 | Discharge: 2023-10-06 | Disposition: A | Discharge status: Home / Self Care | Attending: Emergency Medicine | Admitting: Emergency Medicine

## 2023-10-06 DIAGNOSIS — I10 Essential (primary) hypertension: Secondary | ICD-10-CM | POA: Diagnosis present

## 2023-10-06 DIAGNOSIS — K0889 Other specified disorders of teeth and supporting structures: Secondary | ICD-10-CM | POA: Insufficient documentation

## 2023-10-06 DIAGNOSIS — Z7982 Long term (current) use of aspirin: Secondary | ICD-10-CM | POA: Diagnosis not present

## 2023-10-06 DIAGNOSIS — Z79899 Other long term (current) drug therapy: Secondary | ICD-10-CM | POA: Insufficient documentation

## 2023-10-06 LAB — CBC WITH DIFFERENTIAL/PLATELET
Abs Immature Granulocytes: 0.01 10*3/uL (ref 0.00–0.07)
Basophils Absolute: 0 10*3/uL (ref 0.0–0.1)
Basophils Relative: 0 %
Eosinophils Absolute: 0.2 10*3/uL (ref 0.0–0.5)
Eosinophils Relative: 2 %
HCT: 43 % (ref 36.0–46.0)
Hemoglobin: 13.9 g/dL (ref 12.0–15.0)
Immature Granulocytes: 0 %
Lymphocytes Relative: 25 %
Lymphs Abs: 2 10*3/uL (ref 0.7–4.0)
MCH: 29.4 pg (ref 26.0–34.0)
MCHC: 32.3 g/dL (ref 30.0–36.0)
MCV: 91.1 fL (ref 80.0–100.0)
Monocytes Absolute: 0.6 10*3/uL (ref 0.1–1.0)
Monocytes Relative: 8 %
Neutro Abs: 5.1 10*3/uL (ref 1.7–7.7)
Neutrophils Relative %: 65 %
Platelets: 141 10*3/uL — ABNORMAL LOW (ref 150–400)
RBC: 4.72 MIL/uL (ref 3.87–5.11)
RDW: 13.2 % (ref 11.5–15.5)
WBC: 7.9 10*3/uL (ref 4.0–10.5)
nRBC: 0 % (ref 0.0–0.2)

## 2023-10-06 LAB — BASIC METABOLIC PANEL
Anion gap: 10 (ref 5–15)
BUN: 20 mg/dL (ref 8–23)
CO2: 26 mmol/L (ref 22–32)
Calcium: 9.1 mg/dL (ref 8.9–10.3)
Chloride: 104 mmol/L (ref 98–111)
Creatinine, Ser: 1.18 mg/dL — ABNORMAL HIGH (ref 0.44–1.00)
GFR, Estimated: 47 mL/min — ABNORMAL LOW (ref >60)
Glucose, Bld: 121 mg/dL — ABNORMAL HIGH (ref 70–99)
Potassium: 4.1 mmol/L (ref 3.5–5.1)
Sodium: 140 mmol/L (ref 135–145)

## 2023-10-06 LAB — TROPONIN I (HIGH SENSITIVITY): Troponin I (High Sensitivity): 7 ng/L (ref <18)

## 2023-10-06 MED ORDER — METRONIDAZOLE 500 MG PO TABS
500.0000 mg | ORAL_TABLET | Freq: Once | ORAL | Status: AC
  Administered 2023-10-06: 500 mg via ORAL
  Filled 2023-10-06: qty 1

## 2023-10-06 MED ORDER — METRONIDAZOLE 500 MG PO TABS: 500.0000 mg | ORAL_TABLET | Freq: Two times a day (BID) | ORAL | 0 refills | Status: AC

## 2023-10-06 MED ORDER — AZITHROMYCIN 250 MG PO TABS: 250.0000 mg | ORAL_TABLET | Freq: Every day | ORAL | 0 refills | Status: AC

## 2023-10-06 MED ORDER — LABETALOL HCL 5 MG/ML IV SOLN
10.0000 mg | Freq: Once | INTRAVENOUS | Status: DC
  Filled 2023-10-06: qty 4

## 2023-10-06 MED ORDER — CLONIDINE HCL 0.1 MG PO TABS: 0.1000 mg | ORAL_TABLET | Freq: Once | ORAL | Status: DC

## 2023-10-06 NOTE — ED Notes (Signed)
 PIV attempted in R lateral AC. Blood return noted, samples obtained but IV infiltrated during flush. Second attempt medial AC unsuccessful.

## 2023-10-06 NOTE — ED Triage Notes (Signed)
 Pt present today for elevated BP- SBp 170/64 Denies h/a, blurred vision.Taking BP medication as prescribed. C/O Dental pain on rt side of face tooth pain-rt upper radiating into jaw, Valve replacement Nov. 2024. Has dental appt this week- taking azithromycin for pre appt. However took today and sts tooth feel a little better

## 2023-10-06 NOTE — ED Provider Notes (Signed)
 [facility] Provider Note   CSN: [id number] Arrival date & time: 10/06/23  0219     History  Chief Complaint  Patient presents with   Hypertension    Jamie [name] is a 78 y.o. female.  Patient presents to the emergency department for elevated blood pressure.  Patient reports that she was not feeling well so she took her blood pressure.  Pressure was elevated at home, 170/64.  She reports that her blood pressure is normally well-controlled.  Patient reports that she has been having pain in her right upper teeth for several days.  This has been very concerning to her because she has heart valve replacement.  She was given Zithromax to take prior to dental procedures, took 1 today.  Patient feels like there is some swelling on the right side of her face.       Home Medications Prior to Admission medications   Medication Sig Start Date End Date Taking? Authorizing Provider  azithromycin (ZITHROMAX Z-PAK) 250 MG tablet Take 1 tablet (250 mg total) by mouth daily. 10/06/23  Yes Navon Kotowski, Canary Brim, MD  metroNIDAZOLE (FLAGYL) 500 MG tablet Take 1 tablet (500 mg total) by mouth 2 (two) times daily. One po bid x 7 days 10/06/23  Yes Farida Mcreynolds, Canary Brim, MD  albuterol (PROVENTIL) (2.5 MG/3ML) 0.083% nebulizer solution Inhale 3 mLs into the lungs every 6 (six) hours as needed for wheezing or shortness of breath. 02/19/18   [provider]  albuterol (VENTOLIN HFA) 108 (90 Base) MCG/ACT inhaler Inhale 1-2 puffs into the lungs every 6 (six) hours as needed for wheezing or shortness of breath.    [provider]  aspirin EC 81 MG tablet Take 1 tablet (81 mg total) by mouth daily. 04/26/16   Tereso Newcomer T, PA-C  atorvastatin (LIPITOR) 80 MG tablet TAKE 1 TABLET BY MOUTH ONCE DAILY AT  6PM 09/22/23   End, Cristal Deer, MD  azithromycin (ZITHROMAX) 500 MG tablet Take 1 tablet by mouth 1 hour before dental procedures and cleanings 03/19/23    Janetta Hora, PA-C  Cholecalciferol (VITAMIN D3) 1000 units CAPS Take 1,000 Units by mouth in the morning.    [provider]  ezetimibe (ZETIA) 10 MG tablet Take 10 mg by mouth in the morning.    [provider]  fluticasone (FLONASE) 50 MCG/ACT nasal spray Place 2 sprays into both nostrils daily as needed for allergies. 08/14/13   [provider]  furosemide (LASIX) 40 MG tablet Take 1 tablet (40 mg total) by mouth in the morning. 04/11/23   Janetta Hora, PA-C  isosorbide mononitrate (IMDUR) 60 MG 24 hr tablet Take 1 tablet by mouth twice daily 09/22/23   End, Cristal Deer, MD  lisinopril (ZESTRIL) 5 MG tablet Take 1 tablet by mouth once daily 01/21/23   Charlsie Quest, NP  loratadine (CLARITIN) 10 MG tablet Take 10 mg by mouth every evening.    [provider]  metFORMIN (GLUCOPHAGE) 500 MG tablet Take 1 tablet (500 mg total) by mouth every evening. 01/09/23   End, Cristal Deer, MD  metoprolol tartrate (LOPRESSOR) 50 MG tablet Take 1 tablet by mouth twice daily 08/12/23   End, Cristal Deer, MD  Multiple Vitamin (MULTI VITAMIN DAILY PO) Take 1 tablet by mouth in the morning.    [provider]  nitroGLYCERIN (NITROSTAT) 0.4 MG SL tablet Place 1 tablet (0.4 mg total) under the tongue every 5 (five) minutes as needed. 06/22/18   End,  Cristal Deer, MD  Polyethyl Glycol-Propyl Glycol 0.4-0.3 % SOLN Place 1 drop into both eyes 4 (four) times daily as needed (for dry eyes).     [provider]      Allergies    Penicillins, Vicodin [hydrocodone-acetaminophen], Duloxetine hcl, Sulfa antibiotics, and Sulfamethoxazole    Review of Systems   Review of Systems  Physical Exam Updated Vital Signs BP (!) 147/53   Pulse 62   Temp 98.2 F (36.8 C)   Resp (!) 36   Ht 5\' 5"  (1.651 m)   Wt 108.9 kg   SpO2 97%   BMI 39.94 kg/m  Physical Exam Vitals and nursing note reviewed.  Constitutional:      General: She is not in acute distress.     Appearance: She is well-developed.  HENT:     Head: Normocephalic and atraumatic.     Mouth/Throat:     Mouth: Mucous membranes are moist.  Eyes:     General: Vision grossly intact. Gaze aligned appropriately.     Extraocular Movements: Extraocular movements intact.     Conjunctiva/sclera: Conjunctivae normal.  Cardiovascular:     Rate and Rhythm: Normal rate and regular rhythm.     Pulses: Normal pulses.     Heart sounds: Normal heart sounds, S1 normal and S2 normal. No murmur heard.    No friction rub. No gallop.  Pulmonary:     Effort: Pulmonary effort is normal. No respiratory distress.     Breath sounds: Normal breath sounds.  Abdominal:     General: Bowel sounds are normal.     Palpations: Abdomen is soft.     Tenderness: There is no abdominal tenderness. There is no guarding or rebound.     Hernia: No hernia is present.  Musculoskeletal:        General: No swelling.     Cervical back: Full passive range of motion without pain, normal range of motion and neck supple. No spinous process tenderness or muscular tenderness. Normal range of motion.     Right lower leg: No edema.     Left lower leg: No edema.  Skin:    General: Skin is warm and dry.     Capillary Refill: Capillary refill takes less than 2 seconds.     Findings: No ecchymosis, erythema, rash or wound.  Neurological:     General: No focal deficit present.     Mental Status: She is alert and oriented to person, place, and time.     GCS: GCS eye subscore is 4. GCS verbal subscore is 5. GCS motor subscore is 6.     Cranial Nerves: Cranial nerves 2-12 are intact.     Sensory: Sensation is intact.     Motor: Motor function is intact.     Coordination: Coordination is intact.  Psychiatric:        Attention and Perception: Attention normal.        Mood and Affect: Mood normal.        Speech: Speech normal.        Behavior: Behavior normal.     ED Results / Procedures / Treatments   Labs (all labs ordered are  listed, but only abnormal results are displayed) Labs Reviewed  CBC WITH DIFFERENTIAL/PLATELET - Abnormal; Notable for the following components:      Result Value   Platelets 141 (*)    All other components within normal limits  BASIC METABOLIC PANEL - Abnormal; Notable for the following components:   Glucose,  Bld 121 (*)    Creatinine, Ser 1.18 (*)    GFR, Estimated 47 (*)    All other components within normal limits  TROPONIN I (HIGH SENSITIVITY)    EKG EKG Interpretation Date/Time:  Monday October 06 2023 04:27:40 EST Ventricular Rate:  64 PR Interval:  164 QRS Duration:  109 QT Interval:  409 QTC Calculation: 422 R Axis:   48  Text Interpretation: Sinus rhythm Inferior infarct, age indeterminate Consider anterolateral infarct No significant change since last tracing Confirmed by Gilda Crease [phone]) on 10/06/2023 4:31:24 AM  Radiology No results found.  Procedures Procedures    Medications Ordered in ED Medications  metroNIDAZOLE (FLAGYL) tablet 500 mg (has no administration in time range)     ED Course/ Medical Decision Making/ A&P                                 Medical Decision Making Amount and/or Complexity of Data Reviewed Labs: ordered.  Risk Prescription drug management.   Patient presents to the emergency department for evaluation of elevated blood pressure.  Patient seems anxious and very concerned about the possibility of dental infection because of her previous TAVR.  This is likely driving her blood pressure up.  She did come down here naturally before we are able to give her any intervention.  Workup has been reassuring.  I do not see any significant intraoral swelling or signs of dental abscess.  Patient did take a dose of Zithromax today.  This has been prescribed to her for predental procedures, as she does have a penicillin allergy.  Will continue Zithromax, add Flagyl twice daily for more broad-spectrum coverage.  She will contact her  dentist today.        Final Clinical Impression(s) / ED Diagnoses Final diagnoses:  Primary hypertension  Pain, dental    Rx / DC Orders ED Discharge Orders          Ordered    azithromycin (ZITHROMAX Z-PAK) 250 MG tablet  Daily        10/06/23 0440    metroNIDAZOLE (FLAGYL) 500 MG tablet  2 times daily        10/06/23 0440              Gilda Crease, MD 10/06/23 [phone]

## 2023-10-06 NOTE — Discharge Instructions (Signed)
 Take the antibiotics as prescribed.  Follow-up your dentist as planned.

## 2023-11-17 ENCOUNTER — Other Ambulatory Visit: Payer: Self-pay | Admitting: Internal Medicine

## 2024-01-15 ENCOUNTER — Other Ambulatory Visit: Payer: Self-pay | Admitting: Internal Medicine

## 2024-02-04 ENCOUNTER — Ambulatory Visit: Admitting: Internal Medicine

## 2024-02-15 ENCOUNTER — Other Ambulatory Visit: Payer: Self-pay | Admitting: Cardiology

## 2024-02-15 ENCOUNTER — Other Ambulatory Visit: Payer: Self-pay | Admitting: Internal Medicine

## 2024-03-09 ENCOUNTER — Telehealth: Payer: Self-pay | Admitting: Internal Medicine

## 2024-03-09 NOTE — Telephone Encounter (Signed)
 Called patient she just wanted to check with her doctor due to having heart issues. She states her regular dentist is okay to do these procedures, but they advised her to check with her doctors on if it would be safe. She will not be put to sleep, only numbed up for this procedure.   Advised that I would send a message over to MD for review and would call back. Thanks!

## 2024-03-09 NOTE — Telephone Encounter (Signed)
  Patient is calling asking if Dr End thinks that it would be okay to have Aldeoloplasty and extraction of tooth with bone done with her dentist or if he thinks she should go to a Designer, industrial/product. Please advise.

## 2024-03-09 NOTE — Telephone Encounter (Signed)
 As long as she has not developed any new/worsening cardiac symptoms like shortness of breath or chest pain, I think it is fine for Jamie [name] to move forward with her dental procedure.  I recommend that she continue aspirin  81 mg daily throughout the periprocedural period.  She should also take prophylactic antibiotics to minimize the risk for endocarditis in the setting of her prosthetic aortic valve.  She was previously prescribed azithromycin  500 mg to take 1 hour before the procedure.  If she needs a new prescription, she should let us  know so that this can be sent in before she undergoes the procedure.  [name], MD [facility]

## 2024-03-10 NOTE — Telephone Encounter (Signed)
 Pt made aware and verbalized understanding. Pt stated she has an updated prescription for azithromycin .

## 2024-03-19 ENCOUNTER — Ambulatory Visit: Payer: Medicare Other

## 2024-03-19 ENCOUNTER — Other Ambulatory Visit (HOSPITAL_COMMUNITY): Payer: Medicare Other

## 2024-03-24 ENCOUNTER — Encounter: Payer: Self-pay | Admitting: Internal Medicine

## 2024-03-24 ENCOUNTER — Ambulatory Visit: Attending: Internal Medicine | Admitting: Internal Medicine

## 2024-03-24 VITALS — BP 155/64 | HR 84 | Ht 62.0 in | Wt 264.2 lb

## 2024-03-24 DIAGNOSIS — I25118 Atherosclerotic heart disease of native coronary artery with other forms of angina pectoris: Secondary | ICD-10-CM | POA: Insufficient documentation

## 2024-03-24 DIAGNOSIS — Z952 Presence of prosthetic heart valve: Secondary | ICD-10-CM | POA: Diagnosis present

## 2024-03-24 DIAGNOSIS — I5033 Acute on chronic diastolic (congestive) heart failure: Secondary | ICD-10-CM | POA: Insufficient documentation

## 2024-03-24 DIAGNOSIS — Z79899 Other long term (current) drug therapy: Secondary | ICD-10-CM | POA: Insufficient documentation

## 2024-03-24 DIAGNOSIS — I10 Essential (primary) hypertension: Secondary | ICD-10-CM | POA: Insufficient documentation

## 2024-03-24 DIAGNOSIS — I350 Nonrheumatic aortic (valve) stenosis: Secondary | ICD-10-CM | POA: Insufficient documentation

## 2024-03-24 MED ORDER — ATORVASTATIN CALCIUM 80 MG PO TABS: 80.0000 mg | ORAL_TABLET | Freq: Every day | ORAL | 3 refills | Status: AC

## 2024-03-24 MED ORDER — FUROSEMIDE 20 MG PO TABS: 60.0000 mg | ORAL_TABLET | Freq: Two times a day (BID) | ORAL | 3 refills | Status: DC

## 2024-03-24 NOTE — Progress Notes (Unsigned)
 Cardiology Office Note:  .   Date:  03/25/2024  ID:  Jamie [name], DOB 11/09/1945, MRN [id number] PCP: Ryan Powell HERO., MD  [facility] Providers Cardiologist:  [name], MD     History of Present Illness: .   Jamie [name] is a 78 y.o. female with history of coronary artery disease status post CABG (2013), diastolic heart failure, aortic stenosis status post TAVR (03/2023), hyperlipidemia, carotid artery stenosis, breast cancer status post bilateral mastectomies, diabetes mellitus, COPD, sleep apnea, and obesity, who presents for follow-up of CAD, aortic stenosis, and HFpEF.  I last saw her in 06/2023, at which time she reported feeling significantly better following TAVR.  We did not make any medication changes or pursue additional testing.  She is scheduled for annual follow-up echocardiogram next week.  Today, Ms. Better reports that she has been feeling more short of breath dating back to May.  This seemed to start around the time that she developed a dental infection.  She has had multiple dental extractions since then, which has made it difficult for her to eat solid food.  She feels like a less healthy diet has contributed to some of her weight gain and dyspnea.  She has resumed furosemide  due to weight gain and edema, now taking 40 mg twice daily.  She denies chest pain, palpitations, and lightheadedness.  She notes that a recent CT of the chest obtained through her PCP at [facility] demonstrated severe left subclavian artery stenosis.  She does not have any pain or paresthesias in the left arm.  ROS: See HPI  Studies Reviewed: SABRA   EKG Interpretation Date/Time:  Wednesday March 24 2024 14:39:19 EDT Ventricular Rate:  84 PR Interval:  166 QRS Duration:  90 QT Interval:  408 QTC Calculation: 482 R Axis:   16  Text Interpretation: Sinus rhythm with occasional Premature ventricular complexes Possible Lateral infarct , age undetermined Inferior infarct , age undetermined  Abnormal ECG When compared with ECG of 06-Oct-2023 04:27, Premature ventricular complexes are now Present Confirmed by Kaulder Zahner, Lonni [phone]) on 03/25/2024 6:12:07 PM    TTE (04/11/2023): Normal LV size and wall thickness. LVEF 60-65% with normal wall motion. Diastolic parameters are indeterminate. Normal RV size and function. Normal biatrial size. No pericardial effusion. Mild mitral annular calcification without mitral regurgitation. Trivial TR. TAVR valve in place with appropriate parameters (mean gradient 5.4 mmHg). Normal CVP.   Risk Assessment/Calculations:         Physical Exam:   VS:  BP (!) 155/64 (BP Location: Right Wrist, Patient Position: Sitting, Cuff Size: Normal)   Pulse 84   Ht 5' 2 (1.575 m)   Wt 264 lb 3.2 oz (119.8 kg)   SpO2 99%   BMI 48.32 kg/m    Wt Readings from Last 3 Encounters:  03/24/24 264 lb 3.2 oz (119.8 kg)  10/06/23 240 lb (108.9 kg)  06/16/23 243 lb 6.4 oz (110.4 kg)    Repeat BP: right: 146/70, left: 108/70  General:  NAD. Neck: JVP likely mildly elevated but difficult to assess due to body habitus. Lungs: Clear to auscultation bilaterally without wheezes or crackles. Heart: Regular rate and rhythm with 2/6 systolic murmur. Abdomen: Soft, nontender, nondistended. Extremities: 1+ pretibial edema bilaterally.  ASSESSMENT AND PLAN: .    Acute on chronic HFpEF: Jamie [name] reports worsening dyspnea and weight gain over the last couple of months.  Her weight is up 20-25 pounds since November and March.  She has resumed furosemide , now  taking 40 mg twice daily.  I think it would be reasonable to increase this further to 60 mg twice daily.  I will check a BMP in 2 weeks.  She is already scheduled for echocardiogram next week for follow-up of her TAVR valve.  Coronary artery disease with stable angina: No frank angina reported, though dyspnea has always been her anginal equivalent.  I worry that severe left subclavian artery stenosis noted on recent CT at  [facility] as well as significant blood pressure for differential between the left and right arms could impact her LIMA graft.  However, this graft is known to be small and atretic, so I think it is unlikely that this is driving her symptoms.  Will continue current medications for secondary prevention.  She has known LMCA disease, though much of her left coronary artery backfills from patent SVG-diagonal.  Given her comorbidities, I would like to avoid repeating a catheterization unless she has refractory symptoms.  Continue current medications for secondary prevention.  Severe aortic stenosis status post TAVR: Exam today not consistent with valve dysfunction.  However, given recent dental infections, there is certainly concern that she could have seeded her valve, though she was treated with several courses of antibiotics and also received SBE prophylaxis.  Annual TAVR follow-up echo scheduled for next week.  Hypertension: Blood pressure suboptimally controlled today, likely exacerbated by dietary indiscretion and fluid retention.  As above, will escalate furosemide  to 60 mg twice daily.  Continue remainder of her medications.  Morbid obesity: Weight trending up with BMI approaching 50.  Weight loss encouraged through diet and exercise, though limited mobility noted.    Dispo: Return to clinic in 1 month.  Signed, [name], MD

## 2024-03-24 NOTE — Patient Instructions (Signed)
 Medication Instructions:  Your physician recommends the following medication changes.  INCREASE: Lasix  to 60 mg daily   *If you need a refill on your cardiac medications before your next appointment, please call your pharmacy*  Lab Work: Your provider would like for you to return in to be collected at [facility] appointment in [place] to have the following labs drawn: BMP.   Please go to [facility] [address] ([facility]) [address], [place] [address] You do not need an appointment.  They are open from 8 am- 4:30 pm.  Lunch from 1:00 pm- 2:00 pm You do not need to be fasting.   You may also go to one of the following [facility]:  [address] [place], [place] [address] Phone: [phone] Lab hours: Mon-Fri 8 am- 5 pm    Lunch 12 pm- 1 pm  [address] [place],  [place]  [address]  [place]  Phone: [phone] Lab hours: 7 am- 4 pm Lunch 12 pm-1 pm   [address] [place],  [place]  [address]  [place]  Phone: [phone] Lab hours: Mon-Fri 8 am- 5 pm    Lunch 12 pm- 1 pm  If you have labs (blood work) drawn today and your tests are completely normal, you will receive your results only by: MyChart Message (if you have MyChart) OR A paper copy in the mail If you have any lab test that is abnormal or we need to change your treatment, we will call you to review the results.  Testing/Procedures: No test ordered today   Follow-Up: At [facility], you and your health needs are our priority.  As part of our continuing mission to provide you with exceptional heart care, our providers are all part of one team.  This team includes your primary Cardiologist (physician) and Advanced Practice Providers or APPs (Physician Assistants and Nurse Practitioners) who all work together to provide you with the care you need, when you need it.  Your next appointment:   1 month(s)  Provider:   You may see [name], MD or one of the following Advanced Practice  Providers on your designated Care Team:   Lonni Meager, NP Lesley Maffucci, PA-C Bernardino Bring, PA-C Cadence West Lealman, PA-C Tylene Lunch, NP Barnie Hila, NP

## 2024-03-25 ENCOUNTER — Encounter: Payer: Self-pay | Admitting: Internal Medicine

## 2024-03-26 ENCOUNTER — Telehealth: Payer: Self-pay

## 2024-03-26 NOTE — Telephone Encounter (Signed)
 Per Dr. Mady, on 03/24/24, Dr. Mady recommended that the patient increase Lasix  to 60 mg twice daily. However, the AVS reflected a dosage of once daily. The nurse called the patient to confirm whether she had increased Lasix  to 60 mg BID. The patient reported that she is currently taking it as prescribed on 03/24/24.

## 2024-03-28 NOTE — Progress Notes (Unsigned)
 HEART AND VASCULAR CENTER   MULTIDISCIPLINARY HEART VALVE CLINIC                                     Cardiology Office Note:    Date:  03/29/2024   ID:  Jamie [name], DOB Feb 06, 1946, MRN [id number]  PCP:  Jamie Powell HERO., MD  [facility] Cardiologist:  Jamie Hanson, MD  [facility] Structural heart: None [facility] Electrophysiologist:  None   Referring MD: [name], Jamie M., MD   1 year s/p TAVR  History of Present Illness:    Jamie [name] is a 78 y.o. female with a hx of CAD s/p CABGx4V (2013 by Dr. Lucas), HLD, DMT2, morbid obesity (BMI 42), OSA, COPD, carotid artery disease, breast CA s/p bilateral mastectomies (dx 2014), chronic diastolic CHF, subclavian stenosis and severe aortic stenosis s/p TAVR (8/6/4) who presents to clinic for follow up.   She previously suffered an NSTEMI in 2013 with diagnostic catheterization revealing severe multivessel disease, she underwent CABG x4. Stress testing was performed in May 2016 with finding of a small area of inferolateral ischemia. She was medically managed. She underwent repeat catheterization in 2019 in the setting of dyspnea on exertion and this revealed an atretic LIMA to the LAD, patent vein graft to the diagonal with an occluded jump portion of the graft to the OM 2, and a patent vein graft to the RCA. She had a 70% ostial left main and 50% proximal LAD stenosis. FFR was measured through these stenoses, and was normal at 0.82. She was placed on isosorbide  therapy. She has also been followed over time for known aortic stenosis. Echo 11/27/22 showed EF 55-60% and moderate AS. Given accelerating symptoms of dyspnea and volume overload she underwent L/RHC on 01/06/23 which showed stable, multivessel native coronary artery disease with patent vein graft to the distal RCA, patent vein graft to the D1 with known occlusion of the continuation to the OM 2, and atretic LIMA to the LAD. Overall, coronary anatomy felt to be stable, but  filling pressures were elevated and aortic valve mean gradient 56 mmHg. She underwent TAVR with a 26 mm Medtronic Evolut FX THV via the TF approach on 03/11/23. Post operative echo showed EF 70%, normally functioning TAVR with a mean gradient of 5.6 mmHg and no PVL as well as moderate MAC with mild to moderate MS. She was continued on home Asprin 81 mg daily. 1 month echos showed stable findings and she was able to de-escalate lasix .   She was recently seen by Dr. Hanson 03/24/24 for follow up. She reported worsening SOB and weight gain in the setting of recent dental infections and poor eating habits (20-25lbs since March). She has had several extractions since TAVR. Her lasix  was escalated to 60mg  BID. She was also recently noted to have severe left subclavian artery stenosis on CT of the chest obtained through her PCP at [facility]. She denied pain/paresthesias in left arm. There was concern that severe left subclavian artery stenosis as well as significant blood pressure for differential between the left and right arms could impact her LIMA graft. However, this graft is known to be small and atretic, so it was felt unlikely that this is driving her symptoms. Given her comorbidities, Dr. Hanson would like to avoid repeating a catheterization unless she has refractory symptoms.    Today the patient presents to clinic for follow up.  She has had a terrible time with extensive dental work and mouth pain. She has been eating a lot of unhealthy foods like milkshakes and creamy potatoes. She thinks that her weight gain is a combination of poor eating and also fluid retention. Writing a book and very sedentary. The most activity she gets is walking around the house and to the grocery store. Kids have been helping her with rides. No left arm pain or paresthesias. No chest pain. Has lifestyle limiting sob with mild exertion. Mild LE edema. Chronically sleeps in a recliner.    Past Medical History:  Diagnosis Date   Anxiety     Aortic stenosis    a. 12/2014 Echo:  EF 60-65%, no RWMA, Gr 1 DD, Ao sclerosis, mild AS, mean 14 mmHg, MAC; b. 10/2020 Echo: EF 60-65%, no rwma, GrII DD, nl RV fxn, mildly dil LA, Mild-mod AS; c. 11/2022 Echo: EF 55-60%, mod AS (AVA 1.28cm^2); d. Cath: Mean gradient , AVA 0.7 cm^2.   Asthma    Breast cancer (HCC) 08/2011   lt. breast ca   CAD (coronary artery disease)    a. 12/2011 NSTEMI/Cath: Sev 3VD; b. 12/2011 CABG x4: LIMA-LAD, SVG-RCA, SVG-D1-->OM2; c. 08/2017 Cath: Sev native dzs, LIMA->LAD atretic, VG->D1 ok w/ 100 @ continuation to OM2, VG->dRCA ok-->Med rx; c. 01/2023 Cath: LM 70ost, LAD 50p, LCX nl, RCA 121m, VG->dRCA nl, VG->D1 ok w/ 100% continuation to OM2, LIMA->LAD atretic-->overall unchanged->Med Rx.   Carotid artery disease (HCC)    a.  Pre-CABG Dopplers 5/13: Bilateral 40-59%; b.  Carotid US  6/16: RICA 40-59%, LICA 60-79%, normal subclavians bilaterally;  c. Carotid US  11/16: RICA 40-59%; LICA 40-59%, normal subclavians; d. Carotid US  11/17:  Stable 40-59% bilateral ICA stenoses; e. 07/2017 Carotid U/S: 1-39% bilat ICA stenoses.   Chronic heart failure with preserved ejection fraction (HFpEF) (HCC)    a. 10/2020 Echo: EF 60-65%, GrII DD; b. 11/2022 Echo: EF 55-60%, no rwma, GrII DD.   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 Verlin & Dr. Maryjane.   Shortness of breath    Sleep apnea    sleep study2-3 yrs ago lost weight afterward but gained back   UTI (lower urinary tract infection)    freq-bladder implant -removed     Current  Medications: Current Meds  Medication Sig   albuterol  (PROVENTIL ) (2.5 MG/3ML) 0.083% nebulizer solution Inhale 3 mLs into the lungs every 6 (six) hours as needed for wheezing or shortness of breath.   albuterol  (VENTOLIN  HFA) 108 (90 Base) MCG/ACT inhaler Inhale 1-2 puffs into the lungs every 6 (six) hours as needed for wheezing or shortness of breath.   aspirin  EC 81 MG tablet Take 1 tablet (81 mg total) by mouth daily.   atorvastatin  (LIPITOR ) 80 MG tablet Take 1 tablet (80 mg total) by mouth daily.   azithromycin  (ZITHROMAX  Z-PAK) 250 MG tablet Take 1 tablet (250 mg total) by mouth daily.   Cholecalciferol (VITAMIN  D3) 1000 units CAPS Take 1,000 Units by mouth in the morning.   ezetimibe  (ZETIA ) 10 MG tablet Take 10 mg by mouth in the morning.   fluticasone (FLONASE) 50 MCG/ACT nasal spray Place 2 sprays into both nostrils daily as needed for allergies.   furosemide  (LASIX ) 20 MG tablet Take 3 tablets (60 mg total) by mouth 2 (two) times daily.   isosorbide  mononitrate (IMDUR ) 60 MG 24 hr tablet Take 1 tablet by mouth twice daily   lisinopril  (ZESTRIL ) 5 MG tablet Take 1 tablet by mouth once daily   loratadine  (CLARITIN ) 10 MG tablet Take 10 mg by mouth every evening.   metFORMIN  (GLUCOPHAGE ) 500 MG tablet Take 1 tablet (500 mg total) by mouth every evening.   metolazone  (ZAROXOLYN ) 2.5 MG tablet Take 1 tablet (2.5 mg total) by mouth once a week. Take 1 tablet by mouth once a week 30 minutes prior to taking Lasix    metoprolol  tartrate (LOPRESSOR ) 50 MG tablet Take 1 tablet by mouth twice daily   metroNIDAZOLE  (FLAGYL ) 500 MG tablet Take 1 tablet (500 mg total) by mouth 2 (two) times daily. One po bid x 7 days   Multiple Vitamin (MULTI VITAMIN DAILY PO) Take 1 tablet by mouth in the morning.   nitroGLYCERIN  (NITROSTAT ) 0.4 MG SL tablet Place 1 tablet (0.4 mg total) under the tongue every 5 (five) minutes as needed.   Polyethyl Glycol-Propyl Glycol 0.4-0.3 % SOLN Place 1 drop into both eyes 4  (four) times daily as needed (for dry eyes).    spironolactone  (ALDACTONE ) 25 MG tablet Take 0.5 tablets (12.5 mg total) by mouth daily.      ROS:   Please see the history of present illness.    All other systems reviewed and are negative.  EKGs       Risk Assessment/Calculations:           Physical Exam:    VS:  BP (!) 142/78 (BP Location: Right Arm)   Pulse 70   Ht 5' 2 (1.575 m)   Wt 263 lb (119.3 kg)   SpO2 96%   BMI 48.10 kg/m   108/78 in left arm  Wt Readings from Last 3 Encounters:  03/29/24 263 lb (119.3 kg)  03/24/24 264 lb 3.2 oz (119.8 kg)  10/06/23 240 lb (108.9 kg)     GEN: Well nourished, well developed in no acute distress, obese NECK: JVD difficult to assess due to body habitus CARDIAC: RRR, soft flow murmur @ RUSB. No rubs, gallops RESPIRATORY:  Clear to auscultation without rales, wheezing or rhonchi  ABDOMEN: Soft, non-tender, non-distended EXTREMITIES:  Mild pretibial edema bilaterally No deformity.     ASSESSMENT:    1. S/P TAVR (transcatheter aortic valve replacement)   2. Acute on chronic heart failure with preserved ejection fraction (HFpEF) (HCC)   3. Coronary artery disease of native artery of native heart with stable angina pectoris (HCC)   4. Essential hypertension   5. Subclavian artery stenosis, left (HCC)   6. Morbid obesity (HCC)   7. Poor dentition     PLAN:    In order of problems listed above:  Severe AS s/p TAVR:  -- Echo today shows EF 60%, normally functioning TAVR with a mean gradient of 6 mmHg and no PVL as well as mod MAC with mild-mod MS (this has been stable from echo 03/12/23). -- NYHA III symptoms. I think this is related to a combination of CHF, obesity and deconditioning.  -- She understands the  need for ongoing SBE prophylaxis.  -- Continue Aspirin  81mg  daily. -- Continue regular follow up with Dr. Mady.  Acute on chronic HFpEF:  -- Recent 20-25lbs weight gain since March (240-->264lbs). Confounded by  true weight gain as well with poor diet. -- Lasix  increased to 60mg  BID on 03/24/24. -- Weight down 1 lb by our scales. Does not weigh at home but will start. -- Not a candidate for SGLT2 given obesity and issues with recurrent UTIs and candidal infections.  -- Continue Lasix  60mg  BID. Add metolazone  2.5mg  once a week and spiro 12.5mg  daily.  -- BMET today and again in 2 weeks.   CAD s/p CABG x4V:  -- Continue aspirin  81mg  daily, atorvastatin  80mg  daily, Lopressor  50mg  BID and imdur  60mg  daily. -- Per Dr. Mady: No frank angina reported, though dyspnea has always been her anginal equivalent. I worry that severe left subclavian artery stenosis noted on recent CT at [facility] as well as significant blood pressure for differential between the left and right arms could impact her LIMA graft. However, this graft is known to be small and atretic, so I think it is unlikely that this is driving her symptoms She has known LMCA disease, though much of her left coronary artery backfills from patent SVG-diagonal. Given her comorbidities, I would like to avoid repeating a catheterization unless she has refractory symptoms.  HTN:  -- BP was elevated at last visit and lasix  was increased.  -- BP elevated in right arm today 142/78 in right arm (lower in left arm due to subclavian stenosis). -- Continue lisinopril  5mg  daily, Lopressor  50mg  BID and imdur  60mg  daily. -- As above, continue Lasix  60mg  BID. Add metolazone  2.5mg  once a week and spiro 12.5mg  daily.   Left subclavian stenosis:  -- Asymptomiatic.  -- Differential in L/R BPs.   Morbid obesity: -- Body mass index is 48.1 kg/m. -- Discussed GLP1 therapy given DM, morbid obesity, and CAD. She is very excited about this idea. -- I have sent a referral to our pharm D clinic.    Poor dentition: -- Has had extensive dental work. -- Continue azithromycin  for SBE prophylaxis.     Medication Adjustments/Labs and Tests Ordered: Current medicines are  reviewed at length with the patient today.  Concerns regarding medicines are outlined above.  Orders Placed This Encounter  Procedures   Basic Metabolic Panel (BMET)   AMB Referral to [facility] Pharm[facility]   Meds ordered this encounter  Medications   spironolactone  (ALDACTONE ) 25 MG tablet    Sig: Take 0.5 tablets (12.5 mg total) by mouth daily.    Dispense:  45 tablet    Refill:  3   metolazone  (ZAROXOLYN ) 2.5 MG tablet    Sig: Take 1 tablet (2.5 mg total) by mouth once a week. Take 1 tablet by mouth once a week 30 minutes prior to taking Lasix     Dispense:  12 tablet    Refill:  3    Patient Instructions  Medication Instructions:  Your physician has recommended you make the following change in your medication: Start Spironolactone  12.5mg  (1/2 tablet) once daily.  Start Metolazone  2.5mg  to take by mouth once a week, 30 minutes prior to taking Lasix . *If you need a refill on your cardiac medications before your next appointment, please call your pharmacy*  Lab Work:  BMET today; BMET again in 2 weeks  If you have labs (blood work) drawn today and your tests are completely normal, you will receive your results only by: MyChart  Message (if you have MyChart) OR A paper copy in the mail If you have any lab test that is abnormal or we need to change your treatment, we will call you to review the results.  Testing/Procedures: None needed  Follow-Up: At [facility], you and your health needs are our priority.  As part of our continuing mission to provide you with exceptional heart care, our providers are all part of one team.  This team includes your primary Cardiologist (physician) and Advanced Practice Providers or APPs (Physician Assistants and Nurse Practitioners) who all work together to provide you with the care you need, when you need it.  Your next appointment:   As scheduled  Provider:   [name], MD    We recommend signing up for the patient portal called  MyChart.  Sign up information is provided on this After Visit Summary.  MyChart is used to connect with patients for Virtual Visits (Telemedicine).  Patients are able to view lab/test results, encounter notes, upcoming appointments, etc.  Non-urgent messages can be sent to your provider as well.   To learn more about what you can do with MyChart, go to [email or web address].        Signed, Lamarr Hummer, PA-C  03/29/2024 3:05 PM    [facility]

## 2024-03-29 ENCOUNTER — Other Ambulatory Visit: Payer: Self-pay | Admitting: *Deleted

## 2024-03-29 ENCOUNTER — Ambulatory Visit (HOSPITAL_COMMUNITY)
Admission: RE | Admit: 2024-03-29 | Discharge: 2024-03-29 | Disposition: A | Discharge status: Home / Self Care | Source: Ambulatory Visit | Attending: Cardiology | Admitting: Cardiology

## 2024-03-29 ENCOUNTER — Ambulatory Visit (INDEPENDENT_AMBULATORY_CARE_PROVIDER_SITE_OTHER): Admitting: Physician Assistant

## 2024-03-29 ENCOUNTER — Ambulatory Visit: Payer: Self-pay | Admitting: Physician Assistant

## 2024-03-29 ENCOUNTER — Other Ambulatory Visit (HOSPITAL_COMMUNITY): Payer: Self-pay

## 2024-03-29 VITALS — BP 142/78 | HR 70 | Ht 62.0 in | Wt 263.0 lb

## 2024-03-29 DIAGNOSIS — I771 Stricture of artery: Secondary | ICD-10-CM | POA: Diagnosis not present

## 2024-03-29 DIAGNOSIS — I5033 Acute on chronic diastolic (congestive) heart failure: Secondary | ICD-10-CM

## 2024-03-29 DIAGNOSIS — Z952 Presence of prosthetic heart valve: Secondary | ICD-10-CM | POA: Diagnosis not present

## 2024-03-29 DIAGNOSIS — K089 Disorder of teeth and supporting structures, unspecified: Secondary | ICD-10-CM | POA: Insufficient documentation

## 2024-03-29 DIAGNOSIS — I25118 Atherosclerotic heart disease of native coronary artery with other forms of angina pectoris: Secondary | ICD-10-CM | POA: Insufficient documentation

## 2024-03-29 DIAGNOSIS — I10 Essential (primary) hypertension: Secondary | ICD-10-CM | POA: Insufficient documentation

## 2024-03-29 LAB — ECHOCARDIOGRAM COMPLETE
AR max vel: 2.36 cm2
AV Area VTI: 2.25 cm2
AV Area mean vel: 2.28 cm2
AV Mean grad: 6 mmHg
AV Peak grad: 10.5 mmHg
Ao pk vel: 1.62 m/s
Area-P 1/2: 3.53 cm2
MV VTI: 1.34 cm2
S' Lateral: 3.1 cm

## 2024-03-29 MED ORDER — SPIRONOLACTONE 25 MG PO TABS: 12.5000 mg | ORAL_TABLET | Freq: Every day | ORAL | 3 refills | Status: DC

## 2024-03-29 MED ORDER — METOLAZONE 2.5 MG PO TABS: 2.5000 mg | ORAL_TABLET | ORAL | 3 refills | Status: DC

## 2024-03-29 MED ORDER — PERFLUTREN LIPID MICROSPHERE
1.0000 mL | INTRAVENOUS | Status: AC | PRN
  Administered 2024-03-29: 2 mL via INTRAVENOUS

## 2024-03-29 NOTE — Patient Instructions (Addendum)
 Medication Instructions:  Your physician has recommended you make the following change in your medication: Start Spironolactone  12.5mg  (1/2 tablet) once daily.  Start Metolazone  2.5mg  to take by mouth once a week, 30 minutes prior to taking Lasix . *If you need a refill on your cardiac medications before your next appointment, please call your pharmacy*  Lab Work:  BMET today; BMET again in 2 weeks  If you have labs (blood work) drawn today and your tests are completely normal, you will receive your results only by: MyChart Message (if you have MyChart) OR A paper copy in the mail If you have any lab test that is abnormal or we need to change your treatment, we will call you to review the results.  Testing/Procedures: None needed  Follow-Up: At [facility], you and your health needs are our priority.  As part of our continuing mission to provide you with exceptional heart care, our providers are all part of one team.  This team includes your primary Cardiologist (physician) and Advanced Practice Providers or APPs (Physician Assistants and Nurse Practitioners) who all work together to provide you with the care you need, when you need it.  Your next appointment:   As scheduled  Provider:   [name], MD    We recommend signing up for the patient portal called MyChart.  Sign up information is provided on this After Visit Summary.  MyChart is used to connect with patients for Virtual Visits (Telemedicine).  Patients are able to view lab/test results, encounter notes, upcoming appointments, etc.  Non-urgent messages can be sent to your provider as well.   To learn more about what you can do with MyChart, go to [email or web address].

## 2024-03-30 ENCOUNTER — Ambulatory Visit: Payer: Self-pay | Admitting: Physician Assistant

## 2024-03-30 LAB — BASIC METABOLIC PANEL WITH GFR
BUN/Creatinine Ratio: 11 — ABNORMAL LOW (ref 12–28)
BUN: 14 mg/dL (ref 8–27)
CO2: 25 mmol/L (ref 20–29)
Calcium: 9.9 mg/dL (ref 8.7–10.3)
Chloride: 101 mmol/L (ref 96–106)
Creatinine, Ser: 1.26 mg/dL — ABNORMAL HIGH (ref 0.57–1.00)
Glucose: 125 mg/dL — ABNORMAL HIGH (ref 70–99)
Potassium: 4.5 mmol/L (ref 3.5–5.2)
Sodium: 145 mmol/L — ABNORMAL HIGH (ref 134–144)
eGFR: 43 mL/min/1.73 — ABNORMAL LOW (ref >59)

## 2024-04-11 ENCOUNTER — Emergency Department (HOSPITAL_COMMUNITY)

## 2024-04-11 ENCOUNTER — Encounter (HOSPITAL_COMMUNITY): Payer: Self-pay | Admitting: Emergency Medicine

## 2024-04-11 ENCOUNTER — Emergency Department (HOSPITAL_COMMUNITY)
Admission: EM | Admit: 2024-04-11 | Discharge: 2024-04-11 | Disposition: A | Discharge status: Home / Self Care | Attending: Emergency Medicine | Admitting: Emergency Medicine

## 2024-04-11 ENCOUNTER — Other Ambulatory Visit: Payer: Self-pay

## 2024-04-11 DIAGNOSIS — Z7982 Long term (current) use of aspirin: Secondary | ICD-10-CM | POA: Diagnosis not present

## 2024-04-11 DIAGNOSIS — R911 Solitary pulmonary nodule: Secondary | ICD-10-CM | POA: Diagnosis not present

## 2024-04-11 DIAGNOSIS — R0789 Other chest pain: Secondary | ICD-10-CM | POA: Diagnosis not present

## 2024-04-11 DIAGNOSIS — E785 Hyperlipidemia, unspecified: Secondary | ICD-10-CM | POA: Diagnosis not present

## 2024-04-11 DIAGNOSIS — I252 Old myocardial infarction: Secondary | ICD-10-CM | POA: Insufficient documentation

## 2024-04-11 DIAGNOSIS — Z951 Presence of aortocoronary bypass graft: Secondary | ICD-10-CM | POA: Diagnosis not present

## 2024-04-11 DIAGNOSIS — Z79899 Other long term (current) drug therapy: Secondary | ICD-10-CM | POA: Diagnosis not present

## 2024-04-11 DIAGNOSIS — I509 Heart failure, unspecified: Secondary | ICD-10-CM | POA: Diagnosis not present

## 2024-04-11 DIAGNOSIS — I2510 Atherosclerotic heart disease of native coronary artery without angina pectoris: Secondary | ICD-10-CM | POA: Diagnosis not present

## 2024-04-11 DIAGNOSIS — I10 Essential (primary) hypertension: Secondary | ICD-10-CM

## 2024-04-11 DIAGNOSIS — I700 Atherosclerosis of aorta: Secondary | ICD-10-CM | POA: Insufficient documentation

## 2024-04-11 DIAGNOSIS — R7989 Other specified abnormal findings of blood chemistry: Secondary | ICD-10-CM | POA: Diagnosis not present

## 2024-04-11 DIAGNOSIS — G44209 Tension-type headache, unspecified, not intractable: Secondary | ICD-10-CM | POA: Insufficient documentation

## 2024-04-11 DIAGNOSIS — I110 Hypertensive heart disease with heart failure: Secondary | ICD-10-CM | POA: Diagnosis not present

## 2024-04-11 DIAGNOSIS — R0602 Shortness of breath: Secondary | ICD-10-CM | POA: Diagnosis present

## 2024-04-11 DIAGNOSIS — E119 Type 2 diabetes mellitus without complications: Secondary | ICD-10-CM | POA: Diagnosis not present

## 2024-04-11 DIAGNOSIS — R059 Cough, unspecified: Secondary | ICD-10-CM | POA: Diagnosis present

## 2024-04-11 DIAGNOSIS — R079 Chest pain, unspecified: Secondary | ICD-10-CM

## 2024-04-11 DIAGNOSIS — R000 Tachycardia, unspecified: Secondary | ICD-10-CM | POA: Diagnosis present

## 2024-04-11 DIAGNOSIS — Z952 Presence of prosthetic heart valve: Secondary | ICD-10-CM | POA: Diagnosis not present

## 2024-04-11 DIAGNOSIS — Z7984 Long term (current) use of oral hypoglycemic drugs: Secondary | ICD-10-CM | POA: Diagnosis not present

## 2024-04-11 LAB — CBC
HCT: 44.6 % (ref 36.0–46.0)
Hemoglobin: 14.5 g/dL (ref 12.0–15.0)
MCH: 29.8 pg (ref 26.0–34.0)
MCHC: 32.5 g/dL (ref 30.0–36.0)
MCV: 91.8 fL (ref 80.0–100.0)
Platelets: 154 K/uL (ref 150–400)
RBC: 4.86 MIL/uL (ref 3.87–5.11)
RDW: 13 % (ref 11.5–15.5)
WBC: 7.2 K/uL (ref 4.0–10.5)
nRBC: 0 % (ref 0.0–0.2)

## 2024-04-11 LAB — BRAIN NATRIURETIC PEPTIDE: B Natriuretic Peptide: 110.4 pg/mL — ABNORMAL HIGH (ref 0.0–100.0)

## 2024-04-11 LAB — TROPONIN I (HIGH SENSITIVITY)
Troponin I (High Sensitivity): 21 ng/L — ABNORMAL HIGH (ref <18)
Troponin I (High Sensitivity): 22 ng/L — ABNORMAL HIGH (ref <18)

## 2024-04-11 LAB — BASIC METABOLIC PANEL WITH GFR
Anion gap: 20 — ABNORMAL HIGH (ref 5–15)
BUN: 36 mg/dL — ABNORMAL HIGH (ref 8–23)
CO2: 23 mmol/L (ref 22–32)
Calcium: 9.8 mg/dL (ref 8.9–10.3)
Chloride: 100 mmol/L (ref 98–111)
Creatinine, Ser: 1.41 mg/dL — ABNORMAL HIGH (ref 0.44–1.00)
GFR, Estimated: 38 mL/min — ABNORMAL LOW (ref >60)
Glucose, Bld: 164 mg/dL — ABNORMAL HIGH (ref 70–99)
Potassium: 3.7 mmol/L (ref 3.5–5.1)
Sodium: 143 mmol/L (ref 135–145)

## 2024-04-11 LAB — RESP PANEL BY RT-PCR (RSV, FLU A&B, COVID)  RVPGX2
Influenza A by PCR: NEGATIVE
Influenza B by PCR: NEGATIVE
Resp Syncytial Virus by PCR: NEGATIVE
SARS Coronavirus 2 by RT PCR: NEGATIVE

## 2024-04-11 MED ORDER — ACETAMINOPHEN 500 MG PO TABS
1000.0000 mg | ORAL_TABLET | Freq: Once | ORAL | Status: AC
  Administered 2024-04-11: 1000 mg via ORAL
  Filled 2024-04-11: qty 2

## 2024-04-11 MED ORDER — LORAZEPAM 1 MG PO TABS
1.0000 mg | ORAL_TABLET | Freq: Once | ORAL | Status: AC
  Administered 2024-04-11: 1 mg via ORAL
  Filled 2024-04-11: qty 1

## 2024-04-11 MED ORDER — IOHEXOL 350 MG/ML SOLN
75.0000 mL | Freq: Once | INTRAVENOUS | Status: AC | PRN
  Administered 2024-04-11: 75 mL via INTRAVENOUS

## 2024-04-11 NOTE — ED Notes (Addendum)
 CCMD notified by phone.

## 2024-04-11 NOTE — Discharge Instructions (Signed)
 Your CT scan showed no evidence of blood clot in your lungs or pneumonia or ruptured lung: IMPRESSION:  1. No definite evidence of pulmonary embolus.  2. 3 mm subpleural nodule is noted in right middle lobe. No  follow-up needed if patient is low-risk.This recommendation follows  the consensus statement: Guidelines for Management of Incidental  Pulmonary Nodules Detected on CT Images: From the Fleischner Society  2017; Radiology 2017; 284:228-243.  3. Aortic atherosclerosis.    Aortic Atherosclerosis (ICD10-I70.0).    It did show a small pulmonary nodule.  Follow-up with your PCP regarding this.  Your cardiac enzymes were only mildly elevated but flat and not climbing, this points away from a heart attack as the cause of your symptoms.

## 2024-04-11 NOTE — ED Provider Notes (Signed)
 [place] EMERGENCY DEPARTMENT AT [facility] Provider Note   CSN: [id number] Arrival date & time: 04/11/24  9192     Patient presents with: No chief complaint on file.   Jamie [name] is a 78 y.o. female.   HPI   78 year old female with medical history significant for morbid obesity, CAD/MI status post CABG, severe aortic stenosis status post TAVR, CHF, HLD, HTN, DM2 presenting to the emergency department with shortness of breath.  The patient states that [facility] for outpatient echocardiogram.  On chart review, the patient on outpatient echocardiogram on 03/29/2024 which showed 1 year status post TAVR and EF of 60%, normally functioning TAVR with a mean gradient of 6 mmHg with mild to moderate mitral stenosis stable from prior echo on 03/12/2023.  Patient states that over the past week she has had worsening dyspnea on exertion, states that she cannot take more than a few steps without getting short of breath.  She endorses a dry cough.  She denies any fevers or chills.  She endorses pleuritic chest discomfort.  She is not on anticoagulation.  She denies any history of DVT or PE.  Symptoms have been persistent prompting her presentation to the emergency department today for further evaluation. Patient also endorses a 5 out of 10 headache, described as a tension type headache located in a bandlike pattern across her forehead, not sudden onset or maximal in onset.  She denies any fevers or neck stiffness.  Symptoms came on overnight gradually.  Prior to Admission medications   Medication Sig Start Date End Date Taking? Authorizing Provider  albuterol  (PROVENTIL ) (2.5 MG/3ML) 0.083% nebulizer solution Inhale 3 mLs into the lungs every 6 (six) hours as needed for wheezing or shortness of breath. 02/19/18   [provider]  albuterol  (VENTOLIN  HFA) 108 (90 Base) MCG/ACT inhaler Inhale 1-2 puffs into the lungs every 6 (six)  hours as needed for wheezing or shortness of breath.    [provider]  aspirin  EC 81 MG tablet Take 1 tablet (81 mg total) by mouth daily. 04/26/16   Lelon Hamilton T, PA-C  atorvastatin  (LIPITOR ) 80 MG tablet Take 1 tablet (80 mg total) by mouth daily. 03/24/24 03/24/25  End, Lonni, MD  azithromycin  (ZITHROMAX  Z-PAK) 250 MG tablet Take 1 tablet (250 mg total) by mouth daily. 10/06/23   Haze Lonni PARAS, MD  azithromycin  (ZITHROMAX ) 500 MG tablet Take 1 tablet by mouth 1 hour before dental procedures and cleanings 03/19/23   Sebastian Lamarr SAUNDERS, PA-C  Cholecalciferol (VITAMIN D3) 1000 units CAPS Take 1,000 Units by mouth in the morning.    [provider]  ezetimibe  (ZETIA ) 10 MG tablet Take 10 mg by mouth in the morning.    [provider]  fluticasone (FLONASE) 50 MCG/ACT nasal spray Place 2 sprays into both nostrils daily as needed for allergies. 08/14/13   [provider]  furosemide  (LASIX ) 20 MG tablet Take 3 tablets (60 mg total) by mouth 2 (two) times daily. 03/24/24 03/24/25  End, Lonni, MD  isosorbide  mononitrate (IMDUR ) 60 MG 24 hr tablet Take 1 tablet by mouth twice daily 09/22/23   End, Lonni, MD  lisinopril  (ZESTRIL ) 5 MG tablet Take 1 tablet by mouth once daily 02/17/24   End, Lonni, MD  loratadine  (CLARITIN ) 10 MG tablet Take 10 mg by mouth every evening.    [provider]  metFORMIN  (GLUCOPHAGE ) 500 MG tablet Take 1 tablet (  500 mg total) by mouth every evening. 01/09/23   End, Lonni, MD  metolazone  (ZAROXOLYN ) 2.5 MG tablet Take 1 tablet (2.5 mg total) by mouth once a week. Take 1 tablet by mouth once a week 30 minutes prior to taking Lasix  03/29/24 06/27/24  Sebastian Lamarr SAUNDERS, PA-C  metoprolol  tartrate (LOPRESSOR ) 50 MG tablet Take 1 tablet by mouth twice daily 02/17/24   End, Lonni, MD  metroNIDAZOLE  (FLAGYL ) 500 MG tablet Take 1 tablet (500 mg total) by mouth 2 (two) times daily. One po bid x 7 days 10/06/23    Haze Lonni PARAS, MD  Multiple Vitamin (MULTI VITAMIN DAILY PO) Take 1 tablet by mouth in the morning.    [provider]  nitroGLYCERIN  (NITROSTAT ) 0.4 MG SL tablet Place 1 tablet (0.4 mg total) under the tongue every 5 (five) minutes as needed. 06/22/18   End, Lonni, MD  Polyethyl Glycol-Propyl Glycol 0.4-0.3 % SOLN Place 1 drop into both eyes 4 (four) times daily as needed (for dry eyes).     [provider]  spironolactone  (ALDACTONE ) 25 MG tablet Take 0.5 tablets (12.5 mg total) by mouth daily. 03/29/24   Sebastian Lamarr SAUNDERS, PA-C    Allergies: Penicillins, Vicodin [hydrocodone -acetaminophen ], Duloxetine hcl, Sulfa antibiotics, and Sulfamethoxazole    Review of Systems  Respiratory:  Positive for cough and shortness of breath.   Cardiovascular:  Positive for chest pain.  All other systems reviewed and are negative.   Updated Vital Signs BP 137/74   Pulse 95   Temp 98 F (36.7 C) (Oral)   Resp 18   SpO2 100%   Physical Exam Vitals and nursing note reviewed.  Constitutional:      General: She is not in acute distress.    Appearance: She is well-developed. She is obese.  HENT:     Head: Normocephalic and atraumatic.  Eyes:     Conjunctiva/sclera: Conjunctivae normal.  Cardiovascular:     Rate and Rhythm: Regular rhythm. Tachycardia present.     Heart sounds: No murmur heard. Pulmonary:     Effort: Pulmonary effort is normal. No respiratory distress.     Breath sounds: Normal breath sounds.  Abdominal:     Palpations: Abdomen is soft.     Tenderness: There is no abdominal tenderness.  Musculoskeletal:        General: No swelling.     Cervical back: Normal range of motion and neck supple.  Skin:    General: Skin is warm and dry.     Capillary Refill: Capillary refill takes less than 2 seconds.  Neurological:     General: No focal deficit present.     Mental Status: She is alert. Mental status is at baseline.     Cranial Nerves: No  cranial nerve deficit.     Sensory: No sensory deficit.     Motor: No weakness.  Psychiatric:        Mood and Affect: Mood normal.     (all labs ordered are listed, but only abnormal results are displayed) Labs Reviewed  BASIC METABOLIC PANEL WITH GFR - Abnormal; Notable for the following components:      Result Value   Glucose, Bld 164 (*)    BUN 36 (*)    Creatinine, Ser 1.41 (*)    GFR, Estimated 38 (*)    Anion gap 20 (*)    All other components within normal limits  BRAIN NATRIURETIC PEPTIDE - Abnormal; Notable for the following components:   B Natriuretic Peptide  110.4 (*)    All other components within normal limits  TROPONIN I (HIGH SENSITIVITY) - Abnormal; Notable for the following components:   Troponin I (High Sensitivity) 21 (*)    All other components within normal limits  TROPONIN I (HIGH SENSITIVITY) - Abnormal; Notable for the following components:   Troponin I (High Sensitivity) 22 (*)    All other components within normal limits  RESP PANEL BY RT-PCR (RSV, FLU A&B, COVID)  [id number]  CBC    EKG: EKG Interpretation Date/Time:  Sunday April 11 2024 08:15:50 EDT Ventricular Rate:  105 PR Interval:  170 QRS Duration:  107 QT Interval:  377 QTC Calculation: 499 R Axis:   50  Text Interpretation: Sinus tachycardia Inferior infarct, age indeterminate Confirmed by Jerrol Agent ([id number]) on 04/11/2024 8:26:39 AM  Radiology: CT Angio Chest PE W and/or Wo Contrast Result Date: 04/11/2024 CLINICAL DATA:  Shortness of breath. EXAM: CT ANGIOGRAPHY CHEST WITH CONTRAST TECHNIQUE: Multidetector CT imaging of the chest was performed using the standard protocol during bolus administration of intravenous contrast. Multiplanar CT image reconstructions and MIPs were obtained to evaluate the vascular anatomy. RADIATION DOSE REDUCTION: This exam was performed according to the departmental dose-optimization program which includes automated exposure control, adjustment of the mA  and/or kV according to patient size and/or use of iterative reconstruction technique. CONTRAST:  75mL OMNIPAQUE  IOHEXOL  350 MG/ML SOLN COMPARISON:  Jan 01, 2024. FINDINGS: Cardiovascular: Satisfactory opacification of the pulmonary arteries to the segmental level. No evidence of pulmonary embolism. Normal heart size. No pericardial effusion. Status post transcatheter aortic valve repair. Atherosclerosis of thoracic aorta is noted without aneurysm or dissection. Status post coronary artery bypass graft. Mediastinum/Nodes: No enlarged mediastinal, hilar, or axillary lymph nodes. Thyroid gland, trachea, and esophagus demonstrate no significant findings. Lungs/Pleura: No pneumothorax or pleural effusion is noted. Stable probable scarring seen in left lung apex. 3 mm subpleural nodule is seen in right middle lobe best seen on image 101 of series 6. Upper Abdomen: Lap band device is noted around proximal stomach. Musculoskeletal: No chest wall abnormality. No acute or significant osseous findings. Review of the MIP images confirms the above findings. IMPRESSION: 1. No definite evidence of pulmonary embolus. 2. 3 mm subpleural nodule is noted in right middle lobe. No follow-up needed if patient is low-risk.This recommendation follows the consensus statement: Guidelines for Management of Incidental Pulmonary Nodules Detected on CT Images: From the Fleischner Society 2017; Radiology 2017; 284:228-243. 3. Aortic atherosclerosis. Aortic Atherosclerosis (ICD10-I70.0). Electronically Signed   By: Agent Landy Raddle M.D.   On: 04/11/2024 10:25   DG Chest Portable 1 View Result Date: 04/11/2024 EXAM: 1 VIEW XRAY OF THE CHEST 04/11/2024 08:29:00 AM COMPARISON: PA and lateral radiographs of the chest dated 12/26/2023. CLINICAL HISTORY: Shortness of breath, hypertension. FINDINGS: LUNGS AND PLEURA: No focal pulmonary opacity. No pulmonary edema. No pleural effusion. No pneumothorax. HEART AND MEDIASTINUM: The heart is mildly enlarged.  The patient is status post aortic valve repair. BONES AND SOFT TISSUES: No acute osseous abnormality. There are surgical clips present in the left axilla. IMPRESSION: 1. No acute findings. 2. Mildly enlarged heart, status post aortic valve repair. Electronically signed by: Evalene Coho MD 04/11/2024 08:32 AM EDT RP Workstation: [id number]     Procedures   Medications Ordered in the ED  acetaminophen  (TYLENOL ) tablet 1,000 mg (1,000 mg Oral Given 04/11/24 1005)  iohexol  (OMNIPAQUE ) 350 MG/ML injection 75 mL (75 mLs Intravenous Contrast Given 04/11/24 0955)  LORazepam  (ATIVAN ) tablet 1 mg (1  mg Oral Given 04/11/24 1139)                                    Medical Decision Making Amount and/or Complexity of Data Reviewed Labs: ordered. Radiology: ordered.  Risk OTC drugs. Prescription drug management.    78 year old female with medical history significant for morbid obesity, CAD/MI status post CABG, severe aortic stenosis status post TAVR, CHF, HLD, HTN, DM2 presenting to the emergency department with shortness of breath.  The patient states that she was most [facility] for outpatient echocardiogram.  On chart review, the patient on outpatient echocardiogram on 03/29/2024 which showed 1 year status post TAVR and EF of 60%, normally functioning TAVR with a mean gradient of 6 mmHg with mild to moderate mitral stenosis stable from prior echo on 03/12/2023.  Patient states that over the past week she has had worsening dyspnea on exertion, states that she cannot take more than a few steps without getting short of breath.  She endorses a dry cough.  She denies any fevers or chills.  She endorses pleuritic chest discomfort.  She is not on anticoagulation.  She denies any history of DVT or PE.  Symptoms have been persistent prompting her presentation to the emergency department today for further evaluation. Patient also endorses a 5 out of 10 headache,  described as a tension type headache located in a bandlike pattern across her forehead, not sudden onset or maximal in onset.  She denies any fevers or neck stiffness. Symptoms came on overnight gradually.  On arrival, the patient was afebrile, tachycardic heart rate 105, tachypneic RR 21, hypertensive BP 164/73, saturating at 100% on room air.  Sinus tachycardia noted on cardiac telemetry and on exam.  Exam otherwise unremarkable.  Patient with clear lungs bilaterally and in no respiratory distress.    On arrival, the patient was afebrile, mildly tachycardic heart rate 105, BP 164/73, mildly tachypneic RR 21, saturating 100% on room air.  Patient presenting with what sounds like a tension type headache located in a bandlike pattern forehead, not sudden onset or maximal in onset.  No fever, no nuchal rigidity, range of motion of the neck intact.  Patient neurologically intact with no focal deficits.  Administered Tylenol  with resolution of headache.  Patient states that her blood pressure has been elevated to over 200 systolic at home.  Symptoms are in the setting of this.  She endorsed a dry cough, pleuritic chest discomfort.  Considered PE, considered pneumonia, pneumothorax, viral infection.  EKG:, Ventricular rate 1005, no STEMI.  Chest x-ray: No acute findings, mildly enlarged heart status post aortic valve repair.  Patient has no identifiable murmur on exam.  No pulmonary edema seen on chest x-ray.  Labs: COVID, flu, RSV PCR testing was collected and resulted negative.  CBC was without a leukocytosis or anemia.  BMP revealed mild hyperglycemia to 164, serum creatinine 1.41, close to the patient's baseline of around 1.2-1.3.  BNP was only mildly elevated to 110, nonspecific, Cardiac troponins mildly elevated but flat at 21 and 22.  Patient feeling anxious, administered 1 mg of oral Ativan  feeling symptomatically improved on repeat assessment.  A recheck of patient's blood pressure was found to be  137/74.  CTA PE study: IMPRESSION: 1. No definite evidence of pulmonary embolus. 2. 3 mm subpleural nodule is noted in right middle lobe. No follow-up needed if  patient is low-risk.This recommendation follows the consensus statement: Guidelines for Management of Incidental Pulmonary Nodules Detected on CT Images: From the Fleischner Society 2017; Radiology 2017; 284:228-243. 3. Aortic atherosclerosis.  Aortic Atherosclerosis (ICD10-I70.0).  Patient without symptoms on repeat assessment.  Considered hypertensive emergency however with repeat blood pressure of 137/74, doubt this, suspect likely mild demand ischemia with mildly elevated but flat cardiac troponins in the setting of hypertension.  Low concern for ACS, no evidence of PE or nodule seen in the right lobe and this was communicated to the patient and her discharge paperwork.  No evidence for other acute intrathoracic abnormality requiring inpatient admission and further diagnostic testing.  Patient chest pain not ripping or tearing, radiating to the back, very low concern for aortic dissection.  Given resolution of symptoms on repeat assessment, considered admission for observation versus discharge at this time discharge is reasonable with follow-up with her PCP for a recheck.      Final diagnoses:  Chest pain, unspecified type  Acute non intractable tension-type headache  Pulmonary nodule  Hypertension, unspecified type  Elevated troponin    ED Discharge Orders     None          Jerrol Agent, MD 04/11/24 2044

## 2024-04-11 NOTE — ED Triage Notes (Signed)
 PT here for SOB.  Is being tx for it, taking fluid pills but has had increased SOB and weakness since yesterday.  Did not take fluid pills day before yesterday.  Pt is visibly winded and tachypneic. Pt does not endorse CP at this time. Pt does endorse 5/10 HA.

## 2024-04-11 NOTE — ED Notes (Addendum)
 Pt left prior to d/c v/s being obtained. Pt refused.

## 2024-04-13 ENCOUNTER — Telehealth: Payer: Self-pay | Admitting: Physician Assistant

## 2024-04-13 NOTE — Telephone Encounter (Signed)
 Patient was to have 2 week follow up lab work (BMP) done on 9/9.  She was seen in ED on 9/7 and had BMP done at that time.  I told her she would not need repeat lab work done today. Patient reports prior to ED visit on 9/7 she felt so bad that she did not take any of her medications on 9/6 and 9/7.  She was feeling worn out and nauseated prior to going to ED. Since being seen in ED she has resumed all her medications except metolazone .  She is not having any swelling but complains of being short of breath with any exertion. She is asking for Dr Ulysses recommendations. I told patient message would be sent to Jamie Hummer, PA and Dr End and she would be contacted with recommendations.

## 2024-04-13 NOTE — Telephone Encounter (Signed)
 Left message to call office.

## 2024-04-13 NOTE — Telephone Encounter (Signed)
  Patient states she had labs done on 04/11/24 at the hospital and she is asking if she still needs to come do labs for us . Please advise.

## 2024-04-14 NOTE — Telephone Encounter (Signed)
 Spoke with pt and went over [employer] recommendations. Pt verbalized understanding of plan. Pt proceeded to ask about the imaging completed at her recent ED visit and asked about the incidental pulmonary nodule found on the CT scan. Pt states she overall feels well but walking short distances, she gets very winded and wondered if it could be due to this nodule. Explained to pt that per ED DC summary, there was no concern regarding the nodule at this time. Advised pt to contact PCP to see if they could get the pt in prior to 10/10 appt with Dr. Mady and we would forward the CT results to pt/s PCP to review. Pt verbalized understanding of plan and had no further questions at this time.

## 2024-04-18 NOTE — Telephone Encounter (Signed)
 Please schedule Jamie [name] to see me or an APP at her convenience to reassess her symptoms.  [name], MD [facility]

## 2024-04-19 NOTE — Telephone Encounter (Signed)
 Called pt to let her know that Dr. Mady had an opening on 9/17 @ 4:20pm  as a follow up to her ER visit.  Pt states that she is feeling much better and feels that she doesn't need to Dr. Mady prior to  her scheduled on 05/14/2024. Pt thanked me for the follow up phone call.

## 2024-05-10 ENCOUNTER — Telehealth: Payer: Self-pay | Admitting: Pharmacy Technician

## 2024-05-10 ENCOUNTER — Telehealth: Payer: Self-pay | Admitting: Pharmacist

## 2024-05-10 ENCOUNTER — Other Ambulatory Visit (HOSPITAL_COMMUNITY): Payer: Self-pay

## 2024-05-10 ENCOUNTER — Ambulatory Visit: Attending: Cardiology | Admitting: Pharmacist

## 2024-05-10 ENCOUNTER — Encounter: Payer: Self-pay | Admitting: Pharmacist

## 2024-05-10 MED ORDER — SEMAGLUTIDE(0.25 OR 0.5MG/DOS) 2 MG/3ML ~~LOC~~ SOPN: 0.2500 mg | PEN_INJECTOR | SUBCUTANEOUS | 0 refills | Status: AC

## 2024-05-10 NOTE — Telephone Encounter (Signed)
 Pharmacy Patient Advocate Encounter   Received notification from Pt Calls Messages that prior authorization for mounjaro 2.5 MG is required/requested.   Insurance verification completed.   The patient is insured through [employer].   Per test claim: The current 05/10/24 day co-pay is, $423.27.  No PA needed at this time. This test claim was processed through [facility]- copay amounts may vary at other pharmacies due to pharmacy/plan contracts, or as the patient moves through the different stages of their insurance plan.

## 2024-05-10 NOTE — Progress Notes (Signed)
 Patient ID: Jamie [name]                 DOB: November 22, 1945                    MRN: [id number]     HPI: Jamie [name] is a 78 y.o. female patient referred to pharmacy clinic by Lamarr Hummer, PA-C to initiate GLP1-RA therapy. PMH is significant for T2DM, CAD s/p CABG, aortic stenosis, carotid stenosis, asthma HLD COPD, and obesity. Most recent BMI 48.09 kg/m .  Baseline weight and BMI: 240 lbs 39.94  kg/m  Current weight and BMI: 257 lbs 47.01 kg/m  Current meds that affect weight: furosemide  120 mg daily   Diet: can't chew so diet is limited to soft food  Soft chicken, pasta, potatoes Eggs for breakfast , loves eating ice cream      Exercise: none - willing to start some resistance exercise and stay active around the house instead of sitting on the chair.    Family History:  Relation Problem Comments  Mother (Deceased) Heart disease     Father (Deceased) Heart disease     Maternal Aunt Breast cancer     Maternal Aunt Breast cancer     Paternal Aunt Ovarian cancer     Maternal Grandmother (Deceased)   Maternal Grandfather (Deceased)   Paternal Grandmother (Deceased)   Paternal Grandfather (Deceased)     Social History:   Labs: Lab Results  Component Value Date   HGBA1C 6.1 (H) 08/11/2018    Wt Readings from Last 1 Encounters:  05/10/24 257 lb (116.6 kg)    BP Readings from Last 1 Encounters:  04/11/24 137/74   Pulse Readings from Last 1 Encounters:  04/11/24 95       Component Value Date/Time   CHOL 131 03/30/2019 1145   TRIG 192 (H) 03/30/2019 1145   HDL 42 03/30/2019 1145   CHOLHDL 3.1 03/30/2019 1145   CHOLHDL 3 03/10/2012 1015   VLDL 36.4 03/10/2012 1015   LDLCALC 51 03/30/2019 1145    Past Medical History:  Diagnosis Date   Anxiety    Aortic stenosis    a. 12/2014 Echo:  EF 60-65%, no RWMA, Gr 1 DD, Ao sclerosis, mild AS, mean 14 mmHg, MAC; b. 10/2020 Echo: EF 60-65%, no rwma, GrII DD, nl RV fxn, mildly dil LA, Mild-mod AS; c. 11/2022 Echo:  EF 55-60%, mod AS (AVA 1.28cm^2); d. Cath: Mean gradient , AVA 0.7 cm^2.   Asthma    Breast cancer (HCC) 08/2011   lt. breast ca   CAD (coronary artery disease)    a. 12/2011 NSTEMI/Cath: Sev 3VD; b. 12/2011 CABG x4: LIMA-LAD, SVG-RCA, SVG-D1-->OM2; c. 08/2017 Cath: Sev native dzs, LIMA->LAD atretic, VG->D1 ok w/ 100 @ continuation to OM2, VG->dRCA ok-->Med rx; c. 01/2023 Cath: LM 70ost, LAD 50p, LCX nl, RCA 177m, VG->dRCA nl, VG->D1 ok w/ 100% continuation to OM2, LIMA->LAD atretic-->overall unchanged->Med Rx.   Carotid artery disease    a.  Pre-CABG Dopplers 5/13: Bilateral 40-59%; b.  Carotid US  6/16: RICA 40-59%, LICA 60-79%, normal subclavians bilaterally;  c. Carotid US  11/16: RICA 40-59%; LICA 40-59%, normal subclavians; d. Carotid US  11/17:  Stable 40-59% bilateral ICA stenoses; e. 07/2017 Carotid U/S: 1-39% bilat ICA stenoses.   Chronic heart failure with preserved ejection fraction (HFpEF) (HCC)    a. 10/2020 Echo: EF 60-65%, GrII DD; b. 11/2022 Echo: EF 55-60%, no rwma, GrII DD.   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 Verlin & Dr. Maryjane.   Shortness of breath    Sleep apnea    sleep study2-3 yrs ago lost weight afterward but gained back   UTI (lower urinary tract infection)    freq-bladder implant -removed    Current Outpatient Medications on File Prior to Visit  Medication Sig Dispense Refill   albuterol  (PROVENTIL ) (2.5 MG/3ML) 0.083% nebulizer solution Inhale 3 mLs into the lungs every 6 (six) hours as needed for wheezing or  shortness of breath.  2   albuterol  (VENTOLIN  HFA) 108 (90 Base) MCG/ACT inhaler Inhale 1-2 puffs into the lungs every 6 (six) hours as needed for wheezing or shortness of breath.     aspirin  EC 81 MG tablet Take 1 tablet (81 mg total) by mouth daily.     atorvastatin  (LIPITOR ) 80 MG tablet Take 1 tablet (80 mg total) by mouth daily. 90 tablet 3   azithromycin  (ZITHROMAX  Z-PAK) 250 MG tablet Take 1 tablet (250 mg total) by mouth daily. 5 tablet 0   azithromycin  (ZITHROMAX ) 500 MG tablet Take 1 tablet by mouth 1 hour before dental procedures and cleanings 12 tablet 6   Cholecalciferol (VITAMIN D3) 1000 units CAPS Take 1,000 Units by mouth in the morning.     ezetimibe  (ZETIA ) 10 MG tablet Take 10 mg by mouth in the morning.     fluticasone (FLONASE) 50 MCG/ACT nasal spray Place 2 sprays into both nostrils daily as needed for allergies.     furosemide  (LASIX ) 20 MG tablet Take 3 tablets (60 mg total) by mouth 2 (two) times daily. 540 tablet 3   isosorbide  mononitrate (IMDUR ) 60 MG 24 hr tablet Take 1 tablet by mouth twice daily 180 tablet 3   lisinopril  (ZESTRIL ) 5 MG tablet Take 1 tablet by mouth once daily 90 tablet 0   loratadine  (CLARITIN ) 10 MG tablet Take 10 mg by mouth every evening.     metFORMIN  (GLUCOPHAGE ) 500 MG tablet Take 1 tablet (500 mg total) by mouth every evening.     metolazone  (ZAROXOLYN ) 2.5 MG tablet Take 1 tablet (2.5 mg total) by mouth once a week. Take 1 tablet by mouth once a week 30 minutes prior to taking Lasix  12 tablet 3   metoprolol  tartrate (LOPRESSOR ) 50 MG tablet Take 1 tablet by mouth twice daily 180 tablet 0   metroNIDAZOLE  (FLAGYL ) 500 MG tablet Take 1 tablet (500 mg total) by mouth 2 (two) times daily. One po bid x 7 days 10 tablet 0   Multiple Vitamin (MULTI VITAMIN DAILY PO) Take 1 tablet by mouth in the morning.     nitroGLYCERIN  (NITROSTAT ) 0.4 MG SL tablet Place 1 tablet (0.4 mg total) under the tongue every 5 (five) minutes as needed. 25 tablet 3    Polyethyl Glycol-Propyl Glycol 0.4-0.3 % SOLN Place 1 drop into both eyes 4 (four) times daily as needed (for dry eyes).      spironolactone  (ALDACTONE ) 25 MG tablet Take 0.5  tablets (12.5 mg total) by mouth daily. 45 tablet 3   No current facility-administered medications on file prior to visit.    Allergies  Allergen Reactions   Penicillins Nausea Only and Other (See Comments)    DID THE REACTION INVOLVE: Swelling of the face/tongue/throat, SOB, or low BP? No Sudden or severe rash/hives, skin peeling, or the inside of the mouth or nose? No Did it require medical treatment? #  #  #  YES  #  #  #  When did it last happen? Unknown    Vicodin [Hydrocodone -Acetaminophen ] Other (See Comments)    Syncope    Duloxetine Hcl Nausea Only    Cymbalta*   Sulfa Antibiotics Rash    Childhood Allergy    Sulfamethoxazole Rash     Assessment/Plan:  1. Weight loss -   Problem  Diabetes Mellitus, Type 2 (Hcc)  Morbid Obesity (Hcc)   Morbid obesity (HCC) Patient has not met goal of at least 5% of body weight loss with comprehensive lifestyle modifications alone in the past 3-6 months. Pharmacotherapy is appropriate to pursue as augmentation. Also diabetic and only on metformin  500 mg daily dose. Given ASCVD reasonable to consider addition on GLP1 for better BG control and weight loss.  Will start Ozempic 0.25 mg once a week. Confirmed patient has no personal or family history of medullary thyroid carcinoma (MTC) or Multiple Endocrine Neoplasia syndrome type 2 (MEN 2). Injection technique reviewed at today's visit.  Advised patient on common side effects including nausea, diarrhea, dyspepsia, decreased appetite, and fatigue. Counseled patient on reducing meal size and how to titrate medication to minimize side effects. Counseled patient to call if intolerable side effects or if experiencing dehydration, abdominal pain, or dizziness. Along with pharmacotherapy, the patient will follow dietary  modifications and aim for at least 150 minutes of moderate-intensity exercise per week, plus resistance training twice a week (as recommended by the American Heart Association). This resistance training--such as weightlifting, bodyweight exercises, or using resistance bands, adapted to the patient's ability--will help prevent muscle loss.  Phone follow-ups will be conducted every 4 weeks for dose titration until the patient reaches the effective therapeutic dose and target weight.    Robbi Blanch, Pharm.D [facility]. [facility] [address], [place], [place] [address] Phone: [phone]; Fax: [phone]

## 2024-05-10 NOTE — Assessment & Plan Note (Signed)
 Patient has not met goal of at least 5% of body weight loss with comprehensive lifestyle modifications alone in the past 3-6 months. Pharmacotherapy is appropriate to pursue as augmentation. Also diabetic and only on metformin  500 mg daily dose. Given ASCVD reasonable to consider addition on GLP1 for better BG control and weight loss.  Will start Ozempic 0.25 mg once a week. Confirmed patient has no personal or family history of medullary thyroid carcinoma (MTC) or Multiple Endocrine Neoplasia syndrome type 2 (MEN 2). Injection technique reviewed at today's visit.  Advised patient on common side effects including nausea, diarrhea, dyspepsia, decreased appetite, and fatigue. Counseled patient on reducing meal size and how to titrate medication to minimize side effects. Counseled patient to call if intolerable side effects or if experiencing dehydration, abdominal pain, or dizziness. Along with pharmacotherapy, the patient will follow dietary modifications and aim for at least 150 minutes of moderate-intensity exercise per week, plus resistance training twice a week (as recommended by the American Heart Association). This resistance training--such as weightlifting, bodyweight exercises, or using resistance bands, adapted to the patient's ability--will help prevent muscle loss.  Phone follow-ups will be conducted every 4 weeks for dose titration until the patient reaches the effective therapeutic dose and target weight.

## 2024-05-10 NOTE — Telephone Encounter (Signed)
 See OV encounter for more info

## 2024-05-10 NOTE — Telephone Encounter (Signed)
 Pharmacy Patient Advocate Encounter   Received notification from Pt Calls Messages that prior authorization for Ozempic 2mg  / 3ml is required/requested.   Insurance verification completed.   The patient is insured through [employer].   Per test claim: The current 05/10/24 day co-pay is, $407.82.  No PA needed at this time. This test claim was processed through [facility]- copay amounts may vary at other pharmacies due to pharmacy/plan contracts, or as the patient moves through the different stages of their insurance plan.

## 2024-05-10 NOTE — Assessment & Plan Note (Deleted)
 Patient has not met goal of at least 5% of body weight loss with comprehensive lifestyle modifications alone in the past 3-6 months. Pharmacotherapy is appropriate to pursue as augmentation. Will start*** . Confirmed patient not ***pregnant and no personal or family history of medullary thyroid carcinoma (MTC) or Multiple Endocrine Neoplasia syndrome type 2 (MEN 2). Injection technique reviewed at today's visit.  Advised patient on common side effects including nausea, diarrhea, dyspepsia, decreased appetite, and fatigue. Counseled patient on reducing meal size and how to titrate medication to minimize side effects. Counseled patient to call if intolerable side effects or if experiencing dehydration, abdominal pain, or dizziness. Along with pharmacotherapy, the patient will follow dietary modifications and aim for at least 150 minutes of moderate-intensity exercise per week, plus resistance training twice a week (as recommended by the American Heart Association). This resistance training--such as weightlifting, bodyweight exercises, or using resistance bands, adapted to the patient's ability--will help prevent muscle loss.  Follow up in 1-2 days regarding coverage of *** . If therapy is initiated, phone follow-ups will be conducted every 4 weeks for dose titration until the patient reaches the effective therapeutic dose and target weight.

## 2024-05-14 ENCOUNTER — Encounter: Payer: Self-pay | Admitting: Internal Medicine

## 2024-05-14 ENCOUNTER — Ambulatory Visit: Attending: Internal Medicine | Admitting: Internal Medicine

## 2024-05-14 VITALS — BP 130/60 | HR 68 | Ht 65.0 in | Wt 254.0 lb

## 2024-05-14 DIAGNOSIS — Z952 Presence of prosthetic heart valve: Secondary | ICD-10-CM | POA: Diagnosis present

## 2024-05-14 DIAGNOSIS — I25118 Atherosclerotic heart disease of native coronary artery with other forms of angina pectoris: Secondary | ICD-10-CM | POA: Insufficient documentation

## 2024-05-14 DIAGNOSIS — Z79899 Other long term (current) drug therapy: Secondary | ICD-10-CM | POA: Diagnosis present

## 2024-05-14 DIAGNOSIS — E785 Hyperlipidemia, unspecified: Secondary | ICD-10-CM | POA: Insufficient documentation

## 2024-05-14 DIAGNOSIS — I350 Nonrheumatic aortic (valve) stenosis: Secondary | ICD-10-CM | POA: Diagnosis present

## 2024-05-14 DIAGNOSIS — E1169 Type 2 diabetes mellitus with other specified complication: Secondary | ICD-10-CM | POA: Diagnosis present

## 2024-05-14 DIAGNOSIS — I5032 Chronic diastolic (congestive) heart failure: Secondary | ICD-10-CM | POA: Insufficient documentation

## 2024-05-14 MED ORDER — ISOSORBIDE MONONITRATE ER 60 MG PO TB24: 90.0000 mg | ORAL_TABLET | Freq: Two times a day (BID) | ORAL | 3 refills | Status: AC

## 2024-05-14 NOTE — Patient Instructions (Signed)
 Medication Instructions:  Your physician recommends the following medication changes.  INCREASE: Imdur  to 1 1/2 tablets (90 mg ) by mouth twice a day    *If you need a refill on your cardiac medications before your next appointment, please call your pharmacy*  Lab Work: Your provider would like for you to have following labs drawn today BMP.     Testing/Procedures: No test ordered today   Follow-Up: At [facility], you and your health needs are our priority.  As part of our continuing mission to provide you with exceptional heart care, our providers are all part of one team.  This team includes your primary Cardiologist (physician) and Advanced Practice Providers or APPs (Physician Assistants and Nurse Practitioners) who all work together to provide you with the care you need, when you need it.  Your next appointment:   1 month(s)  Provider:   You may see [name], MD or one of the following Advanced Practice Providers on your designated Care Team:   Lonni Meager, NP Lesley Maffucci, PA-C Bernardino Bring, PA-C Cadence Trinity, PA-C Tylene Lunch, NP Barnie Hila, NP

## 2024-05-14 NOTE — Progress Notes (Signed)
 Cardiology Office Note:  .   Date:  05/14/2024  ID:  Jamie [name], DOB [birth year], MRN [id number] PCP: Ryan Powell HERO., MD  [facility] Providers Cardiologist:  [name], MD     History of Present Illness: .   Jamie [name] is a 78 y.o. female with history of coronary artery disease status post CABG (2013), diastolic heart failure, aortic stenosis status post TAVR (03/2023), hyperlipidemia, carotid artery stenosis, breast cancer status post bilateral mastectomies, diabetes mellitus, COPD, sleep apnea, and obesity, who presents for follow-up of coronary artery disease, aortic stenosis, and chronic HFpEF.  I last saw her in August, at which time she reported more shortness of breath throughout the summer.  This had started around the time that she was dealing with a dental infection and subsequently had multiple tooth extractions that made it difficult for her to eat solid food.  I recommended increasing furosemide  to 60 mg daily.  She was seen in the structural heart clinic in August for follow-up of her TAVR, at which time she continued to complain of shortness of breath with mild activity.  She presented to the ED in early September with pleuritic chest pain and worsening dyspnea.  She was quite hypertensive at that time with workup being reassuring with flat troponin (21 -> 22) and unrevealing CTA chest noting only a tiny subpleural right middle lobe nodule (3 mm).  Today, Jamie [name] reports that she continues to be bothered by significant exertional dyspnea.  If anything, it seems a little worse than at our last visit.  She notes that attempts to diurese her more aggressively have actually made her feel worse and may have contributed to her ED visit in September.  She just started taking semaglutide, as it has not been covered by her insurance and she is paying out-of-pocket for it.  She noticed some abdominal pain after her first dose earlier this week but is feeling better.  She  continues to have dental problems and is eating predominantly soft foods.  She has tried to cut back some on her carbohydrate intake.  Jamie [name] denies frank chest pain but sometimes notes soreness across her chest when she feels very short of breath.  This typically resolves with a few minutes of rest.  She has not been dizzy or lightheaded but feels unsteady on her feet at times.  She has not had any significant lower extremity edema.  She has been sleeping in her recliner for 13 years at a 30 degree incline, though this is more so because of back pain and not orthopnea.  ROS: See HPI  Studies Reviewed: .   EKG (04/11/2024): Sinus tachycardia with possible inferior infarct and poor R wave progression.  Heart rate has increased since 04/01/2024.  PVCs no longer present.  TTE (03/29/2024): Normal LV size and wall thickness.  LVEF 60-65% with normal wall motion and grade 1 diastolic dysfunction.  Normal RV size and function.  Mild left atrial enlargement.  Moderate mitral valve thickening and annular calcification.  Mild to moderate mitral valve stenosis without regurgitation.  Appropriate TAVR valve function with mean gradient of 6 mmHg.  Normal CVP.  Risk Assessment/Calculations:             Physical Exam:   VS:  BP 130/60   Pulse 68   Ht 5' 5 (1.651 m)   Wt 254 lb (115.2 kg)   SpO2 94%   BMI 42.27 kg/m    Wt Readings from Last  3 Encounters:  05/14/24 254 lb (115.2 kg)  05/10/24 257 lb (116.6 kg)  03/29/24 263 lb (119.3 kg)    General:  NAD. Neck: No gross JVD, though body habitus limits evaluation. Lungs: Clear to auscultation bilaterally without wheezes or crackles. Heart: Regular rate and rhythm with 2/6 systolic murmur. Abdomen: Soft, nontender, nondistended. Extremities: No lower extremity edema.  ASSESSMENT AND PLAN: .    Coronary artery disease: Jamie [name] denies frank chest pain but has some vague soreness with her exertional dyspnea.  Question if dyspnea is her anginal  equivalent.  Last catheterization in 01/2023 showed severe ostial LMCA disease as well as CTO of the RCA and moderate mid LAD disease.  Appearance was similar to last catheterization in 2019.  LIMA remained atretic.  SVG-D1 was patent with chronic total occlusion of sequential portion to OM branch.  SVG-RCA was also widely patent.  Given that prior catheterizations have been challenging and would be even more difficult in the setting of her TAVR, I favor deferring catheterization unless she has refractory symptoms.  We have agreed to increase isosorbide  mononitrate to 90 mg daily.  We also discussed adding ranolazine, though given her mild QT prolongation, she may not tolerate this.  Hopefully, her dyspnea will improve as she continues to lose weight with the recent addition of semaglutide.  Chronic HFpEF: Volume status is challenging to assess given Jamie [name]'s morbid obesity.  She does not appear significantly volume overloaded.  She continues to have significant dyspnea with minimal activity consistent with NYHA class III heart failure.  Her weight is gradually trending down, having peaked in 03/2024 at 264 pounds.  She is still 14 pounds above her prior weight from 10/2023 of 240 pounds.  We will continue her current doses of furosemide  60 mg twice daily and metolazone  2.5 mg weekly.  I will check a BMP today to ensure that her renal function is stable, given slight bump in creatinine during ED visit last month.  Could consider SGLT2 inhibitor in the future, though we have been reluctant to add this in the past given history of recurrent UTIs.  Continue recently added semaglutide.  If symptoms do not improve with continued optimization of medical therapy, we may ultimately need to repeat a right and left heart catheterization.  Severe aortic stenosis status post TAVR: Chronic dyspnea had initially improved following TAVR but seems to have worsened since her dental issues began in the spring.  Echocardiogram  in 03/2024 showed preserved LVEF with appropriate function of TAVR valve.  Continue annual echo follow-up and SBE prophylaxis.  Hyperlipidemia associated with type 2 diabetes mellitus: Continue high intensity statin therapy and ezetimibe : LDL well-controlled on last check in 02/2024 at 52.  Triglycerides mildly elevated.  Ongoing management of DM per Dr. Ryan.  Morbid obesity: BMI remains greater than 40 with multiple comorbidities.  I encouraged continued lifestyle modifications and ongoing semaglutide use.    Dispo: Return to clinic in 1 month.  Signed, [name], MD

## 2024-05-15 LAB — BASIC METABOLIC PANEL WITH GFR
BUN/Creatinine Ratio: 28 (ref 12–28)
BUN: 53 mg/dL — ABNORMAL HIGH (ref 8–27)
CO2: 25 mmol/L (ref 20–29)
Calcium: 10.3 mg/dL (ref 8.7–10.3)
Chloride: 100 mmol/L (ref 96–106)
Creatinine, Ser: 1.87 mg/dL — ABNORMAL HIGH (ref 0.57–1.00)
Glucose: 110 mg/dL — ABNORMAL HIGH (ref 70–99)
Potassium: 4.5 mmol/L (ref 3.5–5.2)
Sodium: 145 mmol/L — ABNORMAL HIGH (ref 134–144)
eGFR: 27 mL/min/1.73 — ABNORMAL LOW (ref >59)

## 2024-05-17 ENCOUNTER — Ambulatory Visit: Payer: Self-pay | Admitting: Internal Medicine

## 2024-05-17 DIAGNOSIS — Z79899 Other long term (current) drug therapy: Secondary | ICD-10-CM

## 2024-05-17 MED ORDER — FUROSEMIDE 20 MG PO TABS: 40.0000 mg | ORAL_TABLET | Freq: Every day | ORAL | 3 refills | Status: AC

## 2024-05-26 LAB — BASIC METABOLIC PANEL WITH GFR
BUN/Creatinine Ratio: 17 (ref 12–28)
BUN: 24 mg/dL (ref 8–27)
CO2: 25 mmol/L (ref 20–29)
Calcium: 9.8 mg/dL (ref 8.7–10.3)
Chloride: 103 mmol/L (ref 96–106)
Creatinine, Ser: 1.4 mg/dL — ABNORMAL HIGH (ref 0.57–1.00)
Glucose: 104 mg/dL — ABNORMAL HIGH (ref 70–99)
Potassium: 3.9 mmol/L (ref 3.5–5.2)
Sodium: 146 mmol/L — ABNORMAL HIGH (ref 134–144)
eGFR: 38 mL/min/1.73 — ABNORMAL LOW (ref >59)

## 2024-05-28 ENCOUNTER — Other Ambulatory Visit: Payer: Self-pay | Admitting: Internal Medicine

## 2024-06-11 ENCOUNTER — Other Ambulatory Visit: Payer: Self-pay | Admitting: Internal Medicine

## 2024-06-15 ENCOUNTER — Ambulatory Visit: Attending: Physician Assistant | Admitting: Physician Assistant

## 2024-06-15 ENCOUNTER — Encounter: Payer: Self-pay | Admitting: Physician Assistant

## 2024-06-15 VITALS — BP 139/63 | HR 58 | Ht 65.0 in | Wt 252.4 lb

## 2024-06-15 DIAGNOSIS — Z79899 Other long term (current) drug therapy: Secondary | ICD-10-CM | POA: Diagnosis present

## 2024-06-15 DIAGNOSIS — Z952 Presence of prosthetic heart valve: Secondary | ICD-10-CM | POA: Diagnosis present

## 2024-06-15 DIAGNOSIS — I350 Nonrheumatic aortic (valve) stenosis: Secondary | ICD-10-CM | POA: Insufficient documentation

## 2024-06-15 DIAGNOSIS — E785 Hyperlipidemia, unspecified: Secondary | ICD-10-CM | POA: Insufficient documentation

## 2024-06-15 DIAGNOSIS — I25118 Atherosclerotic heart disease of native coronary artery with other forms of angina pectoris: Secondary | ICD-10-CM | POA: Insufficient documentation

## 2024-06-15 DIAGNOSIS — I5032 Chronic diastolic (congestive) heart failure: Secondary | ICD-10-CM | POA: Insufficient documentation

## 2024-06-15 NOTE — Patient Instructions (Signed)
 Medication Instructions:  Your physician recommends that you continue on your current medications as directed. Please refer to the Current Medication list given to you today.   *If you need a refill on your cardiac medications before your next appointment, please call your pharmacy*  Lab Work: Your provider would like for you to have following labs drawn today BMeT.   If you have labs (blood work) drawn today and your tests are completely normal, you will receive your results only by: MyChart Message (if you have MyChart) OR A paper copy in the mail If you have any lab test that is abnormal or we need to change your treatment, we will call you to review the results.  Follow-Up: At [facility], you and your health needs are our priority.  As part of our continuing mission to provide you with exceptional heart care, our providers are all part of one team.  This team includes your primary Cardiologist (physician) and Advanced Practice Providers or APPs (Physician Assistants and Nurse Practitioners) who all work together to provide you with the care you need, when you need it.  Your next appointment:   1-2 month(s)  Provider:   You may see [name], MD   We recommend signing up for the patient portal called MyChart.  Sign up information is provided on this After Visit Summary.  MyChart is used to connect with patients for Virtual Visits (Telemedicine).  Patients are able to view lab/test results, encounter notes, upcoming appointments, etc.  Non-urgent messages can be sent to your provider as well.   To learn more about what you can do with MyChart, go to [email or web address].

## 2024-06-15 NOTE — Progress Notes (Signed)
 Cardiology Office Note    Date:  06/15/2024   ID:  Jamie [name], DOB [birth year], MRN [id number]  PCP:  Daritza Brees Powell HERO., MD  Cardiologist:  [name], MD  Electrophysiologist:  None   Chief Complaint: Follow-up  History of Present Illness:   Jamie [name] is a 78 y.o. female with history of CAD status post CABG in 2013, aortic stenosis status post TAVR in 03/2023, HFpEF, carotid artery stenosis, breast cancer status post bilateral mastectomies, DM2, COPD, HLD, obesity, and sleep apnea who presents for follow-up of dyspnea.  She was admitted with NSTEMI in 2013 with LHC revealing severe multivessel CAD with subsequent four-vessel CABG.  Stress test in 12/2014 showed a small area of inferolateral ischemia with medical management.  Repeat LHC in 2019, in the setting of exertional dyspnea, revealed an atretic LIMA to LAD, patent SVG to diagonal with occluded jump portion of the graft to OM 2, and patent vein graft to RCA.  There was 70% ostial left main and 50% proximal LAD stenosis.  FFR was unrevealing.  Medical therapy was escalated with addition of isosorbide .  She has been followed over the years for aortic stenosis with echo in 11/2022 showing an EF of 55 to 60% with moderate aortic stenosis.  Given progressive dyspnea and volume overload, she underwent R/LHC in 01/2023 which showed stable, multivessel native CAD with patent vein graft to distal RCA, patent vein graft to D1 with known occlusion of the continuation to OM 2, and known atretic LIMA to LAD.  Overall, coronary anatomy was felt to be stable.  However, filling pressures were elevated and aortic valve mean gradient was 56 mmHg.  In this setting she underwent TAVR in 03/2023.  Postprocedure echo showed a normally functioning TAVR with an EF of 70% with moderate MAC with mild to moderate mitral stenosis.  More recently, she was evaluated in 03/2024 reporting worsening shortness of breath and weight gain in the setting of recent dental  infections and poor eating habits leading to escalation of furosemide .  Also, she was noted to have severe left subclavian artery stenosis on CT of the chest obtained through her PCP at [facility].  She denied pain or paresthesias in the left arm.  Initially, there was concern that the severe left subclavian artery stenosis, as well as a significant blood pressure differential between the right and left arms could impact her LIMA graft.  However, this graft was noted to be small and atretic so it was felt to be unlikely driving her symptoms.  Most recent echo from 03/2024 showed an EF of 60 to 65%, no regional wall motion abnormalities, grade 1 diastolic dysfunction, normal RV systolic function and ventricular cavity size, mild to moderate mitral stenosis with moderate mitral annular calcification, status post TAVR with no evidence of stenosis or regurgitation, and normal CVP.  She was seen in the ED in 04/2024 with pleuritic chest pain and worsening dyspnea.  She was hypertensive at that time with reassuring workup with flat troponin of 21 trending to 22.  CTA of the chest was unrevealing outside of a tiny subpleural right middle lobe nodule measuring 3 mm.  She was most recently seen in the office on 05/14/2024 continue to note exertional dyspnea that was a little worse than her prior visit.  She reported attempts to diurese her more aggressively made her feel worse.  She has just started taking semaglutide (insurance was not covering, and she was paying out-of-pocket).  She was sleeping in  a recliner, though had done this for approximately 13 years and was at baseline.  Her weight remained approximately 14 pounds up from her visit in 10/2023.  At that time it was recommended she continue furosemide  60 mg twice daily along with metolazone  2.5 mg weekly.  Labs obtained at that time showed progressive renal dysfunction leading furosemide  to be reduced to 40 mg daily, and in the holding of metolazone  and spironolactone .   Follow-up labs showed improvement in renal function.  She comes in today accompanied by her daughter, with both patient and daughter noting a significant improvement in the patient's shortness of breath.  She is now able to ambulate throughout the house and at a recent store without significant dyspnea.  They also noted an improvement in the patient's baseline lower extremity swelling.  She continues to sleep in a recliner, though has done so for years with this being stable.  Her weight is down 2 pounds today when compared to her visit in 05/2024.  She remains on GLP-1 therapy.  Currently taking furosemide  40 mg daily.  She has cut out some of her snacking and is following a lower sodium diet.  No symptoms of angina.  She is pleased with the improvement in her baseline dyspnea.   Labs independently reviewed: 05/2024 - BUN 24, serum creatinine 1.4, potassium 3.9 04/2024 - BNP 110, high-sensitivity troponin 21 with a delta troponin 22, Hgb 14.5, PLT 154 02/2024 - TC 127, TG 250, HDL 46, LDL 52, albumin  4.1, AST 43, ALT normal, A1c 6.9 08/2023 - TSH normal  Past Medical History:  Diagnosis Date   Anxiety    Aortic stenosis    a. 12/2014 Echo:  EF 60-65%, no RWMA, Gr 1 DD, Ao sclerosis, mild AS, mean 14 mmHg, MAC; b. 10/2020 Echo: EF 60-65%, no rwma, GrII DD, nl RV fxn, mildly dil LA, Mild-mod AS; c. 11/2022 Echo: EF 55-60%, mod AS (AVA 1.28cm^2); d. Cath: Mean gradient , AVA 0.7 cm^2.   Asthma    Breast cancer (HCC) 08/2011   lt. breast ca   CAD (coronary artery disease)    a. 12/2011 NSTEMI/Cath: Sev 3VD; b. 12/2011 CABG x4: LIMA-LAD, SVG-RCA, SVG-D1-->OM2; c. 08/2017 Cath: Sev native dzs, LIMA->LAD atretic, VG->D1 ok w/ 100 @ continuation to OM2, VG->dRCA ok-->Med rx; c. 01/2023 Cath: LM 70ost, LAD 50p, LCX nl, RCA 164m, VG->dRCA nl, VG->D1 ok w/ 100% continuation to OM2, LIMA->LAD atretic-->overall unchanged->Med Rx.   Carotid artery disease    a.  Pre-CABG Dopplers 5/13: Bilateral 40-59%; b.   Carotid US  6/16: RICA 40-59%, LICA 60-79%, normal subclavians bilaterally;  c. Carotid US  11/16: RICA 40-59%; LICA 40-59%, normal subclavians; d. Carotid US  11/17:  Stable 40-59% bilateral ICA stenoses; e. 07/2017 Carotid U/S: 1-39% bilat ICA stenoses.   Chronic heart failure with preserved ejection fraction (HFpEF) (HCC)    a. 10/2020 Echo: EF 60-65%, GrII DD; b. 11/2022 Echo: EF 55-60%, no rwma, GrII DD.   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 Verlin & Dr. Maryjane.   Shortness of breath    Sleep apnea    sleep study2-3 yrs ago lost weight afterward but gained back   UTI (lower urinary tract infection)    freq-bladder implant -removed    Past Surgical History:  Procedure Laterality Date   ABDOMINAL HYSTERECTOMY  1977   APPENDECTOMY     AXILLARY LYMPH NODE DISSECTION  10/18/2011   Procedure: AXILLARY LYMPH NODE DISSECTION;  Surgeon: Deward GORMAN Curvin DOUGLAS, MD;  Location: [facility] OR;  Service: General;  Laterality: Left;   BREAST RECONSTRUCTION  10/18/2011   Procedure: BREAST RECONSTRUCTION;  Surgeon: Estefana Reichert, DO;  Location: [facility] OR;  Service: Plastics;  Laterality: Bilateral;   bilateral breast reconstruction with bilateral tissue expander and placement of flex hd.   CARDIAC CATHETERIZATION     CESAREAN SECTION  04/1976   CHOLECYSTECTOMY N/A 08/17/2018   Procedure: LAPAROSCOPIC CHOLECYSTECTOMY;  Surgeon: Vernetta Berg, MD;  Location: [facility] OR;  Service: General;  Laterality: N/A;   COLONOSCOPY WITH PROPOFOL  N/A 06/20/2019   Procedure:  COLONOSCOPY WITH PROPOFOL ;  Surgeon: Rollin Dover, MD;  Location: [facility] ENDOSCOPY;  Service: Endoscopy;  Laterality: N/A;   CORONARY ARTERY BYPASS GRAFT  12/13/2011   Procedure: CORONARY ARTERY BYPASS GRAFTING (CABG);  Surgeon: Dorise MARLA Fellers, MD;  Location: [facility] OR;  Service: Open Heart Surgery;  Laterality: N/A;  Times four using endoscopically harvested left greater saphenous vein and left internal mammary artery. Right greater saphenous vein attempted; not appropriate for vein harvest.   CORONARY PRESSURE/FFR STUDY N/A 08/22/2017   Procedure: INTRAVASCULAR PRESSURE WIRE/FFR STUDY;  Surgeon: Mady Bruckner, MD;  Location: [facility] INVASIVE CV LAB;  Service: Cardiovascular;  Laterality: N/A;   EYE SURGERY     bilateral cataract extraction    INTRAOPERATIVE TRANSTHORACIC ECHOCARDIOGRAM N/A 03/11/2023   Procedure: INTRAOPERATIVE TRANSTHORACIC ECHOCARDIOGRAM;  Surgeon: Verlin Bruckner BIRCH, MD;  Location: [facility] INVASIVE CV LAB;  Service: Open Heart Surgery;  Laterality: N/A;   LAPAROSCOPIC GASTRIC BANDING  2010   LEFT HEART CATHETERIZATION WITH CORONARY ANGIOGRAM N/A 12/11/2011   Procedure: LEFT HEART CATHETERIZATION WITH CORONARY ANGIOGRAM;  Surgeon: Ezra GORMAN Shuck, MD;  Location: [facility] CATH LAB;  Service: Cardiovascular;  Laterality: N/A;   LESION REMOVAL  04/09/2012   Procedure: LESION REMOVAL;  Surgeon: Estefana Reichert, DO;  Location: [facility];  Service: Plastics;  Laterality: Bilateral;   MASTECTOMY W/ SENTINEL NODE BIOPSY  10/18/2011   Procedure: MASTECTOMY WITH SENTINEL LYMPH NODE BIOPSY;  Surgeon: Deward GORMAN Curvin DOUGLAS, MD;  Location: [facility] OR;  Service: General;  Laterality: Bilateral;  bilateral mastectomy and left sentinel node biopsy   NASAL SINUS SURGERY     POLYPECTOMY  06/20/2019   Procedure: POLYPECTOMY;  Surgeon: Rollin Dover, MD;  Location: [facility] ENDOSCOPY;  Service: Endoscopy;;   PORT-A-CATH REMOVAL  04/09/2012   Procedure: REMOVAL PORT-A-CATH;  Surgeon: Deward GORMAN Curvin DOUGLAS, MD;   Location: [facility];  Service: General;  Laterality: Right;   PORTACATH PLACEMENT  12/02/2011   Procedure: INSERTION PORT-A-CATH;  Surgeon: Deward GORMAN Curvin DOUGLAS, MD;  Location: [facility] OR;  Service: General;  Laterality: Right;   RIGHT/LEFT HEART CATH AND CORONARY/GRAFT ANGIOGRAPHY N/A 08/22/2017   Procedure: RIGHT/LEFT HEART CATH AND CORONARY/GRAFT ANGIOGRAPHY;  Surgeon: Mady Bruckner, MD;  Location: [facility] INVAS[facility] CV LAB;  Service: Cardiovascular;  Laterality: N/A;   RIGHT/LEFT HEART CATH AND CORONARY/GRAFT ANGIOGRAPHY N/A 01/06/2023   Procedure: RIGHT/LEFT HEART CATH AND CORONARY/GRAFT ANGIOGRAPHY;  Surgeon: Mady Bruckner, MD;  Location: [facility]VAS[facility] CV LAB;  Service: Cardiovascular;  Laterality: N/A;   TONSILLECTOMY     TRANSCATHETER AORTIC VALVE REPLACEMENT, TRANSFEMORAL N/A 03/11/2023   Procedure: Transcatheter Aortic Valve Replacement, Transfemoral;  Surgeon: Verlin Bruckner BIRCH, MD;  Location: [facility] INVAS[facility] CV LAB;  Service: Open Heart Surgery;  Laterality: N/A;   US  ECHOCARDIOGRAPHY     at [facility]   VALVE REPLACEMENT  06/2023   VIDEO BRONCHOSCOPY WITH ENDOBRONCHIAL NAVIGATION Left 11/26/2013   Procedure: VIDEO BRONCHOSCOPY WITH ENDOBRONCHIAL NAVIGATION;  Surgeon: Lamar GORMAN Chris, MD;  Location: [facility] OR;  Service: Thoracic;  Laterality: Left;    Current Medications: Current Meds  Medication Sig   albuterol  (VENTOLIN  HFA) 108 (90 Base) MCG/ACT inhaler Inhale 1-2 puffs into the lungs every 6 (six) hours as needed for wheezing or shortness of breath.   aspirin  EC 81 MG tablet Take 1 tablet (81 mg total) by mouth daily.   atorvastatin  (LIPITOR ) 80 MG tablet Take 1 tablet (80 mg total) by mouth daily.   Cholecalciferol (VITAMIN D3) 1000 units CAPS Take 1,000 Units by mouth in the morning.   ezetimibe  (ZETIA ) 10 MG tablet Take 10 mg by mouth in the morning.   fluticasone (FLONASE) 50 MCG/ACT nasal spray Place 2 sprays into both nostrils daily as needed for allergies.    furosemide  (LASIX ) 20 MG tablet Take 2 tablets (40 mg total) by mouth daily.   isosorbide  mononitrate (IMDUR ) 60 MG 24 hr tablet Take 1.5 tablets (90 mg total) by mouth 2 (two) times daily.   lisinopril  (ZESTRIL ) 5 MG tablet Take 1 tablet by mouth once daily   loratadine  (CLARITIN ) 10 MG tablet Take 10 mg by mouth every evening.   metFORMIN  (GLUCOPHAGE ) 500 MG tablet Take 1 tablet (500 mg total) by mouth every evening.   metoprolol  tartrate (LOPRESSOR ) 50 MG tablet Take 1 tablet by mouth twice daily   Multiple Vitamin (MULTI VITAMIN DAILY PO) Take 1 tablet by mouth in the morning.   nitroGLYCERIN  (NITROSTAT ) 0.4 MG SL tablet Place 1 tablet (0.4 mg total) under the tongue every 5 (five) minutes as needed.   Polyethyl Glycol-Propyl Glycol 0.4-0.3 % SOLN Place 1 drop into both eyes 4 (four) times daily as needed (for dry eyes).    Semaglutide,0.25 or 0.5MG /DOS, 2 MG/3ML SOPN Inject 0.25 mg into the skin once a week.    Allergies:   Penicillins, Vicodin [hydrocodone -acetaminophen ], Duloxetine hcl, Sulfa antibiotics, and Sulfamethoxazole   Social History   Socioeconomic History   Marital status: Married    Spouse name: Not on file   Number of children: 6   Years of education: Not on file   Highest education level: Not on file  Occupational History   Occupation: retired  Tobacco Use   Smoking status: Former    Current packs/day: 0.00    Average packs/day: 2.0 packs/day for 40.0 years (80.0 ttl pk-yrs)    Types: Cigarettes    Start date: 08/25/1966    Quit date: 08/25/2006    Years since quitting: 17.8   Smokeless tobacco: Never  Vaping Use   Vaping status: Never Used  Substance and Sexual Activity   Alcohol use: Yes    Comment: 1-2 times a year   Drug use: No   Sexual activity: Not Currently  Other Topics Concern   Not on file  Social History Narrative   Not on file   Social Drivers of Health   Financial Resource Strain: Not on file  Food  Insecurity: Low Risk  (08/21/2023)    Received from [facility]   Hunger Vital Sign    Within the past 12 months, you worried that your food would run out before you got money to buy more: Never true    Within the past 12 months, the food you bought just didn't last and you didn't have money to get more. : Never true  Transportation Needs: No Transportation Needs (08/21/2023)   Received from [facility]    In the past 12 months, has lack of reliable transportation kept you from medical appointments, meetings, work or from getting things needed for daily living? : No  Physical Activity: Not on file  Stress: Not on file  Social Connections: Not on file     Family History:  The patient's family history includes Breast cancer in her maternal aunt and maternal aunt; Heart disease in her father and mother; Ovarian cancer in her paternal aunt. There is no history of Anesthesia problems, Hypotension, Malignant hyperthermia, or Pseudochol deficiency.  ROS:   12-point review of systems is negative unless otherwise noted in the HPI.   EKGs/Labs/Other Studies Reviewed:    Studies reviewed were summarized above. The additional studies were reviewed today:  2D echo 03/29/2024: 1. Left ventricular ejection fraction, by estimation, is 60 to 65%. The  left ventricle has normal function. The left ventricle has no regional  wall motion abnormalities. Left ventricular diastolic parameters are  consistent with Grade I diastolic  dysfunction (impaired relaxation).   2. Right ventricular systolic function is normal. The right ventricular  size is normal.   3. Left atrial size was mildly dilated.   4. The mitral valve is normal in structure. No evidence of mitral valve  regurgitation. Mild to moderate mitral stenosis. Moderate mitral annular  calcification.   5. Post TAVR wtih 26 mm Medtronic Evolut valve no PVL mean gradient 6  peak 11 mmHg AVA 2.3 cm2. The aortic valve has been repaired/replaced.  Aortic valve  regurgitation is not visualized. No aortic stenosis is  present.   6. The inferior vena cava is normal in size with greater than 50%  respiratory variability, suggesting right atrial pressure of 3 mmHg.  __________  2D echo 04/11/2023: 1. Left ventricular ejection fraction, by estimation, is 60 to 65%. The  left ventricle has normal function. The left ventricle has no regional  wall motion abnormalities. Left ventricular diastolic parameters are  indeterminate. Elevated left ventricular  end-diastolic pressure.   2. Right ventricular systolic function is normal. The right ventricular  size is normal. There is normal pulmonary artery systolic pressure.   3. The mitral valve is normal in structure. No evidence of mitral valve  regurgitation. No evidence of mitral stenosis.   4. The aortic valve has been repaired/replaced. Aortic valve  regurgitation is not visualized. No aortic stenosis is present. There is a  26 mm stented (TAVR) valve present in the aortic position. Procedure Date:  03/11/23. Aortic valve area, by VTI measures   5.15 cm. Aortic valve mean gradient measures 5.4 mmHg. Aortic valve Vmax  measures 1.50 m/s. DVI 0.97.   5. The inferior vena cava is normal in size with greater than 50%  respiratory variability, suggesting right atrial pressure of 3 mmHg.  __________  2D echo 03/12/2023: 1. Left ventricular ejection fraction, by estimation, is 70 to 75%. The  left ventricle has hyperdynamic function. The left ventricle has no  regional wall motion abnormalities. Left ventricular diastolic parameters  are consistent with Grade I diastolic  dysfunction (impaired relaxation). Elevated left atrial pressure.   2. Right ventricular systolic function is hyperdynamic. The right  ventricular size is normal. Tricuspid regurgitation signal is inadequate  for assessing PA pressure.   3. Left atrial size was mild to moderately dilated.   4. The mitral valve is degenerative. No evidence of  mitral valve  regurgitation. Mild mitral stenosis. The mean mitral valve gradient is 7.1  mmHg with average heart rate of 91 bpm. Moderate to severe mitral annular  calcification.   5. The aortic valve has been repaired/replaced. Aortic valve  regurgitation is not visualized. There is a 26 mm Medtronic  CoreValve-EvolutR prosthetic (TAVR) valve present in the aortic position.  Echo findings are consistent with normal structure and  function of the aortic valve prosthesis. Aortic valve mean gradient  measures 5.6 mmHg. Aortic valve Vmax measures 1.52 m/s. Aortic valve  acceleration time measures 94 msec.   6. The inferior vena cava is normal in size with greater than 50%  respiratory variability, suggesting right atrial pressure of 3 mmHg.  __________  Limited echo 03/11/2023:  1. Left ventricular ejection fraction, by estimation, is >75%. The left  ventricle has hyperdynamic function. The left ventricle has no regional  wall motion abnormalities. Left ventricular diastolic parameters are  consistent with Grade II diastolic  dysfunction (pseudonormalization).   2. Right ventricular systolic function is hyperdynamic. The right  ventricular size is normal.   3. Left atrial size was moderately dilated.   4. Moderate to severe mitral annular calcification.   5. The aortic valve has been repaired/replaced. There is a 26 mm  Medtronic CoreValve-Evolut Pro prosthetic (TAVR) valve present in the  aortic position. Aortic valve mean gradient measures 4.0 mmHg. Aortic  valve Vmax measures 1.53 m/s. Aortic valve  acceleration time measures 63 msec.  __________  [facility] 01/06/2023: Conclusions: Severe ostial LMCA disease and chronic total occlusion of mid RCA.  There is also moderate disease of mid LAD.  Overall appearance is similar to prior catheterization in 2019. Widely patent SVG-D1 with chronic total occlusion of sequential portion to OM. Widely patent SVG-RCA. Small LIMA-LAD that has partial  retrograde filling from the LAD (known to be atretic by prior cath). Mildly elevated left and right heart filling pressures. Normal Fick cardiac output/index. Severe aortic valve stenosis (cannot exclude some element of subvalvular/LVOT gradient).   Recommendations: Increase diuresis. Increase metoprolol  and reduce isosorbide  mononitrate. Aggressive secondary prevention of coronary artery disease. Consider referral to Dr. McAlhany/structural heart team for further evaluation/management of aortic stenosis if symptoms do not improve at follow-up. __________  Carotid artery ultrasound 11/27/2022: Summary:  Right Carotid: Velocities in the right ICA are consistent with a 1-39% stenosis.   Left Carotid: Velocities in the left ICA are consistent with a 1-39% stenosis.  Plaque characteristics were difficult to assess due to poor resolution.   Vertebrals:  Right vertebral artery demonstrates antegrade flow. Left  vertebral artery demonstrates bidirectional flow.  Subclavians: Normal flow hemodynamics were seen in bilateral subclavian arteries.  __________  2D echo 11/27/2022: 1. Left ventricular ejection fraction, by estimation, is 55 to 60%. The  left ventricle has normal function. The left ventricle has no regional  wall motion abnormalities. Left ventricular diastolic parameters are  consistent with Grade II diastolic  dysfunction (pseudonormalization).   2. Right ventricular systolic function is normal. The right ventricular  size is normal.   3. Left atrial size was mildly dilated.   4. The  mitral valve is normal in structure. No evidence of mitral valve  regurgitation.   5. The aortic valve was not well visualized. Aortic valve regurgitation  is not visualized. Moderate aortic valve stenosis. Aortic valve area, by  VTI measures 1.28 cm. Aortic valve mean gradient measures 24.0 mmHg.  Aortic valve Vmax measures 3.38 m/s.   6. The inferior vena cava is normal in size with greater  than 50%  respiratory variability, suggesting right atrial pressure of 3 mmHg.  __________  2D echo 10/23/2021: 1. Left ventricular ejection fraction, by estimation, is 60 to 65%. The  left ventricle has normal function. The left ventricle has no regional  wall motion abnormalities. Left ventricular diastolic parameters are  consistent with Grade II diastolic  dysfunction (pseudonormalization).   2. Right ventricular systolic function is normal. The right ventricular  size is normal.   3. Left atrial size was mildly dilated.   4. The mitral valve is normal in structure. No evidence of mitral valve  regurgitation. No evidence of mitral stenosis.   5. The aortic valve is normal in structure. There is moderate  calcification of the aortic valve. Aortic valve regurgitation is not  visualized. Moderate aortic valve stenosis. Aortic valve area, by VTI  measures 1.54 cm. Aortic valve mean gradient  measures 20.0 mmHg. Aortic valve Vmax measures 3.10 m/s.   6. The inferior vena cava is normal in size with greater than 50%  respiratory variability, suggesting right atrial pressure of 3 mmHg.   Comparison(s): Previous AS pressure gradients were 28 max. 16 mean.  __________  2D echo 10/11/2020: 1. Left ventricular ejection fraction, by estimation, is 60 to 65%. The  left ventricle has normal function. The left ventricle has no regional  wall motion abnormalities. Left ventricular diastolic parameters are  consistent with Grade II diastolic  dysfunction (pseudonormalization).   2. Right ventricular systolic function is normal. The right ventricular  size is normal.   3. Left atrial size was mildly dilated.   4. The mitral valve is normal in structure. No evidence of mitral valve  regurgitation. No evidence of mitral stenosis.   5. The aortic valve was not well visualized. There is moderate  calcification of the aortic valve. Aortic valve regurgitation is not  visualized. Mild to moderate  aortic valve stenosis by mean gradient  measures 16.0 mmHg, peak gradient 28 mm Hg and peak  velocity 2.65 m/s. Moderate stenosis by Aortic valve area, by VTI measures  1.04 cm.  __________  2D echo 06/08/2019: 1. Left ventricular ejection fraction, by visual estimation, is 60 to  65%. The left ventricle has normal function. There is no left ventricular  hypertrophy.   2. Left ventricular diastolic parameters are consistent with Grade I  diastolic dysfunction (impaired relaxation).   3. Global right ventricle has normal systolic function.The right  ventricular size is normal. No increase in right ventricular wall  thickness.   4. Left atrial size was normal.   5. Right atrial size was normal.   6. The mitral valve is normal in structure. No evidence of mitral valve  regurgitation. No evidence of mitral stenosis.   7. The tricuspid valve is normal in structure. Tricuspid valve  regurgitation is not demonstrated.   8. Aortic valve mean gradient measures 20.0 mmHg.   9. Aortic valve peak gradient measures 37.0 mmHg.  10. The aortic valve is normal in structure. Aortic valve regurgitation is  not visualized. Moderate aortic valve stenosis.  11. There is Moderate sclerosis  of the aortic valve.  12. There is Moderate thickening of the aortic valve.  13. The pulmonic valve was normal in structure. Pulmonic valve  regurgitation is not visualized.  14. The inferior vena cava is normal in size with greater than 50%  respiratory variability, suggesting right atrial pressure of 3 mmHg.   In comparison to the previous echocardiogram(s): Prior examinations were  reviewed in a side by side comparison of images. 07/24/17 EF 60-65%. Mild  AS mean, peak.  Aortic stenosis has slightly advanced when compared to prior.  __________  [facility] 08/22/2017: Conclusions: Severe native coronary artery disease, including 70% ostial LMCA, 50% proximal LAD, and 100% mid RCA lesions. Atretic  LIMA->LAD. Patent SVG->D1; jump portion to OM2 appears chronically occluded. Widely patent SVG->RCA. FFR of ostial LMCA and proximal LAD lesions is not significant (FFR 0.82), though this may be influenced by some retrograde from via the SVG->D. Mildly elevated left heart, right heart, and pulmonary artery pressures. Normal Fick cardiac output/index. Mild aortic stenosis.   Recommendations: Aggressive medical therapy. I will start isosorbide  mononitrate 30 mg daily, which should be escalated as tolerated. If the patient is intolerant of isosorbide  mononitrate of has incomplete relief of symptoms, addition of ranolazine and pulmonary consultation should be considered. If marked dyspnea persists despite maximal antianginal therapy, adequate diuresis, weight loss, and pulmonary evaluation, PCI to the LMCA could be considered. Continue secondary prevention, including high-intensity statin therapy and diabetes control. __________  See CV Studies for more remote cardiac imaging.  EKG:  EKG is not ordered today.    Recent Labs: 04/11/2024: B Natriuretic Peptide 110.4; Hemoglobin 14.5; Platelets 154 05/25/2024: BUN 24; Creatinine, Ser 1.40; Potassium 3.9; Sodium 146  Recent Lipid Panel    Component Value Date/Time   CHOL 131 03/30/2019 1145   TRIG 192 (H) 03/30/2019 1145   HDL 42 03/30/2019 1145   CHOLHDL 3.1 03/30/2019 1145   CHOLHDL 3 03/10/2012 1015   VLDL 36.4 03/10/2012 1015   LDLCALC 51 03/30/2019 1145    PHYSICAL EXAM:    VS:  BP 139/63   Pulse (!) 58   Ht 5' 5 (1.651 m)   Wt 252 lb 6.4 oz (114.5 kg)   SpO2 96%   BMI 42.00 kg/m   BMI: Body mass index is 42 kg/m.  Physical Exam Constitutional:      Appearance: She is well-developed.  HENT:     Head: Normocephalic and atraumatic.  Eyes:     General:        Right eye: No discharge.        Left eye: No discharge.  Neck:     Comments: JVD difficult to assess secondary to body habitus. Cardiovascular:     Rate and  Rhythm: Normal rate and regular rhythm.     Pulses:          Posterior tibial pulses are 2+ on the right side and 2+ on the left side.     Heart sounds: Normal heart sounds, S1 normal and S2 normal. Heart sounds not distant. No midsystolic click and no opening snap. No murmur heard.    No friction rub.  Pulmonary:     Effort: Pulmonary effort is normal. No respiratory distress.     Breath sounds: Normal breath sounds. No decreased breath sounds, wheezing, rhonchi or rales.  Musculoskeletal:     Cervical back: Normal range of motion.     Right lower leg: No edema.     Left lower leg: No  edema.  Skin:    General: Skin is warm and dry.     Nails: There is no clubbing.  Neurological:     Mental Status: She is alert and oriented to person, place, and time.  Psychiatric:        Speech: Speech normal.        Behavior: Behavior normal.        Thought Content: Thought content normal.        Judgment: Judgment normal.     Wt Readings from Last 3 Encounters:  06/15/24 252 lb 6.4 oz (114.5 kg)  05/14/24 254 lb (115.2 kg)  05/10/24 257 lb (116.6 kg)     ASSESSMENT & PLAN:   HFpEF: Volume status is difficult to assess on physical exam secondary to body habitus.  However, both patient and daughter report the patient's dyspnea has significantly improved when compared to her visit 1 month ago despite no significant intervention.  Her weight is down 2 pounds today when compared to her visit in 05/2024.  We will obtain a BMP to ensure continued improvement in renal function.  For now, she remains on furosemide  40 mg daily.  If labs allow, would consider reinitiation of spironolactone .  Defer addition of SGLT2 inhibitor given history of recurrent UTIs.  Continue semaglutide as outlined below.  Given significant improvement in symptoms, we will defer R/LHC at this time, particularly given stable coronary anatomy on cath last year.  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  Initially, there was concern that this may be her anginal equivalent, however given significant improvement in symptoms we will defer further cardiac testing at this time.  Continue aggressive risk factor modification and second prevention including aspirin  81 mg, atorvastatin  80 mg, ezetimibe  10 mg, and Imdur  90 mg.  Severe aortic stenosis status post TAVR: Stable functioning valve on echo in 03/2024.  Continue with annual echo and SBE prophylaxis.  Remains on aspirin .  HLD: LDL 52 in 02/2024 with normal ALT at that time.  Remains on atorvastatin  80 mg and ezetimibe  10 mg.  Morbid obesity: BMI 42.  Continue heart healthy diet and lifestyle modification.  Remains on the semaglutide without significant off target effect.     Disposition: F/u with Dr. Mady or an APP in 1 month.   Medication Adjustments/Labs and Tests Ordered: Current medicines are reviewed at length with the patient today.  Concerns regarding medicines are outlined above. Medication changes, Labs and Tests ordered today are summarized above and listed in the Patient Instructions accessible in Encounters.   Signed, Bernardino Bring, PA-C 06/15/2024 5:02 PM     [facility] [place] [address] [place], [place] [address] [phone]

## 2024-06-16 LAB — BASIC METABOLIC PANEL WITH GFR
BUN/Creatinine Ratio: 11 — ABNORMAL LOW (ref 12–28)
BUN: 13 mg/dL (ref 8–27)
CO2: 26 mmol/L (ref 20–29)
Calcium: 10 mg/dL (ref 8.7–10.3)
Chloride: 100 mmol/L (ref 96–106)
Creatinine, Ser: 1.22 mg/dL — ABNORMAL HIGH (ref 0.57–1.00)
Glucose: 92 mg/dL (ref 70–99)
Potassium: 3.6 mmol/L (ref 3.5–5.2)
Sodium: 143 mmol/L (ref 134–144)
eGFR: 45 mL/min/1.73 — ABNORMAL LOW (ref >59)

## 2024-06-17 ENCOUNTER — Ambulatory Visit: Payer: Self-pay | Admitting: Physician Assistant

## 2024-06-24 ENCOUNTER — Other Ambulatory Visit: Payer: Self-pay | Admitting: Internal Medicine

## 2024-08-18 ENCOUNTER — Ambulatory Visit: Attending: Internal Medicine | Admitting: Internal Medicine

## 2024-08-18 ENCOUNTER — Encounter: Payer: Self-pay | Admitting: Internal Medicine

## 2024-08-18 VITALS — BP 128/70 | HR 77 | Ht 64.0 in | Wt 253.0 lb

## 2024-08-18 DIAGNOSIS — I25118 Atherosclerotic heart disease of native coronary artery with other forms of angina pectoris: Secondary | ICD-10-CM | POA: Insufficient documentation

## 2024-08-18 DIAGNOSIS — E785 Hyperlipidemia, unspecified: Secondary | ICD-10-CM | POA: Diagnosis present

## 2024-08-18 DIAGNOSIS — I10 Essential (primary) hypertension: Secondary | ICD-10-CM

## 2024-08-18 DIAGNOSIS — I350 Nonrheumatic aortic (valve) stenosis: Secondary | ICD-10-CM | POA: Diagnosis present

## 2024-08-18 DIAGNOSIS — I5032 Chronic diastolic (congestive) heart failure: Secondary | ICD-10-CM | POA: Diagnosis present

## 2024-08-18 DIAGNOSIS — Z952 Presence of prosthetic heart valve: Secondary | ICD-10-CM | POA: Insufficient documentation

## 2024-08-18 DIAGNOSIS — I4510 Unspecified right bundle-branch block: Secondary | ICD-10-CM | POA: Insufficient documentation

## 2024-08-18 NOTE — Progress Notes (Signed)
 " Cardiology Office Note:  .   Date:  08/20/2024  ID:  [name], DOB 10/17/1945, MRN [id number] PCP: Ryan Powell HERO., MD  [facility] Providers Cardiologist:  [name], MD     History of Present Illness: .   Jamie [name] is a 79 y.o. female with history of coronary artery disease status post CABG (2013), diastolic heart failure, aortic stenosis status post TAVR (03/2023), hyperlipidemia, carotid artery stenosis, breast cancer status post bilateral mastectomies, diabetes mellitus, COPD, sleep apnea, and obesity, who presents for follow-up of coronary artery disease, aortic stenosis, and HFpEF.  She was last seen in our office on 06/15/2024 by Bernardino Bring, PA, at which time she reported she reported feeling less short of breath.  She remained on furosemide  40 mg p.o. daily, with previous cessation of spironolactone  and metolazone  due to worsening renal insufficiency.  Repeat BMP at that time showed improvement in creatinine back to baseline.  No medication changes were pursued.  Today, [name] happily reports that she has continued to feel better.  In fact, this is the best that she has felt in a long time.  She has minimal shortness of breath and denies chest pain, palpitations, and lightheadedness.  She noticed increased foot/ankle swelling since her last visit and has increased furosemide  to 40 mg QAM and 20 mg QPM.  She is no longer taking semaglutide  due to cost and believes this may have contributed to some of her weight gain.  ROS: See HPI  Studies Reviewed: SABRA   EKG Interpretation Date/Time:  Wednesday August 18 2024 16:24:56 EST Ventricular Rate:  77 PR Interval:  166 QRS Duration:  120 QT Interval:  432 QTC Calculation: 488 R Axis:   42  Text Interpretation: Normal sinus rhythm Low voltage QRS Right bundle branch block Inferior infarct , age undetermined Abnormal ECG When compared with ECG of 11-Apr-2024 08:15, Right bundle branch block is now Present Confirmed by  Mylik Pro, Lonni [id number]) on 08/20/2024 3:43:40 PM    Risk Assessment/Calculations:           Physical Exam:   VS:  BP 128/70 (BP Location: Left Arm, Patient Position: Sitting, Cuff Size: Normal)   Pulse 77   Ht 5' 4 (1.626 m)   Wt 253 lb (114.8 kg)   SpO2 97%   BMI 43.43 kg/m    Wt Readings from Last 3 Encounters:  08/18/24 253 lb (114.8 kg)  06/15/24 252 lb 6.4 oz (114.5 kg)  05/14/24 254 lb (115.2 kg)    General:  NAD. Neck: No JVD or HJR. Lungs: Clear to auscultation bilaterally without wheezes or crackles. Heart: Regular rate and rhythm with 1/6 systolic murmur. Abdomen: Soft, nontender, nondistended. Extremities: No lower extremity edema.  ASSESSMENT AND PLAN: .    Coronary artery disease with stable angina and hyperlipidemia: No chest pain or significant dyspnea (anginal equivalent) reported today.  We will continue secondary prevention with ASA, atorvastatin , and ezetimibe , as well as antianginal therapy with Imdur  90 mg BID and metoprolol  50 mg BID.  Aortic stenosis s/p TAVR: Breathing much improved over the last 6 months.  Continue aspirin  and SBE prophylaxis.  Continue annual ech/follow-up with structural heart team.  Chronic HFpEF: Volume status appears stable, though [name] recently added furosemide  20 mg in the evening.  I think it is reasonable to continue with 40 mg QAM and 20 mg QPM.  Unfortunately, semaglutide  has become cost-prohibitive; hopefully this will change in the future as price/insurance coverage improve.  RBBB: Slight interval QRS widening noted on today's EKG, now meeting criteria for RBBB.  No symptoms of high-grade AV block reported.  Continue EKG monitoring.  Morbid obesity: Weight trending up, which [name] attributes to her diet and discontinuation of semaglutide .  Weight loss encouraged.    Dispo: Return to clinic in 6 months.  Signed, [name], MD  "

## 2024-08-18 NOTE — Patient Instructions (Signed)

## 2024-08-20 ENCOUNTER — Encounter: Payer: Self-pay | Admitting: Internal Medicine

## 2024-09-09 ENCOUNTER — Other Ambulatory Visit: Payer: Self-pay | Admitting: Internal Medicine
